# Patient Record
Sex: Female | Born: 1985
Health system: Southern US, Community
[De-identification: ages and names within clinical notes are randomized; demographics above are authoritative.]

## PROBLEM LIST (undated history)

## (undated) DIAGNOSIS — Z949 Transplanted organ and tissue status, unspecified: Secondary | ICD-10-CM

## (undated) DIAGNOSIS — N898 Other specified noninflammatory disorders of vagina: Secondary | ICD-10-CM

## (undated) DIAGNOSIS — N189 Chronic kidney disease, unspecified: Secondary | ICD-10-CM

## (undated) DIAGNOSIS — R569 Unspecified convulsions: Secondary | ICD-10-CM

## (undated) DIAGNOSIS — N926 Irregular menstruation, unspecified: Secondary | ICD-10-CM

## (undated) DIAGNOSIS — F329 Major depressive disorder, single episode, unspecified: Secondary | ICD-10-CM

## (undated) DIAGNOSIS — Z9889 Other specified postprocedural states: Secondary | ICD-10-CM

## (undated) DIAGNOSIS — G629 Polyneuropathy, unspecified: Secondary | ICD-10-CM

## (undated) DIAGNOSIS — H3320 Serous retinal detachment, unspecified eye: Secondary | ICD-10-CM

## (undated) DIAGNOSIS — F32A Depression, unspecified: Secondary | ICD-10-CM

## (undated) DIAGNOSIS — D649 Anemia, unspecified: Secondary | ICD-10-CM

## (undated) DIAGNOSIS — T8859XA Other complications of anesthesia, initial encounter: Secondary | ICD-10-CM

## (undated) DIAGNOSIS — E049 Nontoxic goiter, unspecified: Secondary | ICD-10-CM

## (undated) DIAGNOSIS — N390 Urinary tract infection, site not specified: Secondary | ICD-10-CM

## (undated) DIAGNOSIS — F419 Anxiety disorder, unspecified: Secondary | ICD-10-CM

## (undated) DIAGNOSIS — L02415 Cutaneous abscess of right lower limb: Secondary | ICD-10-CM

## (undated) DIAGNOSIS — B259 Cytomegaloviral disease, unspecified: Secondary | ICD-10-CM

## (undated) DIAGNOSIS — R112 Nausea with vomiting, unspecified: Secondary | ICD-10-CM

## (undated) DIAGNOSIS — R87620 Atypical squamous cells of undetermined significance on cytologic smear of vagina (ASC-US): Principal | ICD-10-CM

## (undated) DIAGNOSIS — R87629 Unspecified abnormal cytological findings in specimens from vagina: Secondary | ICD-10-CM

## (undated) DIAGNOSIS — E039 Hypothyroidism, unspecified: Secondary | ICD-10-CM

## (undated) DIAGNOSIS — B379 Candidiasis, unspecified: Secondary | ICD-10-CM

## (undated) DIAGNOSIS — I10 Essential (primary) hypertension: Secondary | ICD-10-CM

## (undated) DIAGNOSIS — R87811 Vaginal high risk human papillomavirus (HPV) DNA test positive: Principal | ICD-10-CM

## (undated) DIAGNOSIS — E119 Type 2 diabetes mellitus without complications: Secondary | ICD-10-CM

## (undated) DIAGNOSIS — B009 Herpesviral infection, unspecified: Secondary | ICD-10-CM

## (undated) HISTORY — DX: Irregular menstruation, unspecified: N92.6

## (undated) HISTORY — DX: Transplanted organ and tissue status, unspecified: Z94.9

## (undated) HISTORY — PX: PILONIDAL CYST EXCISION: SHX744

## (undated) HISTORY — DX: Vaginal high risk human papillomavirus (HPV) DNA test positive: R87.811

## (undated) HISTORY — PX: CHOLECYSTECTOMY: SHX55

## (undated) HISTORY — DX: Anxiety disorder, unspecified: F41.9

## (undated) HISTORY — DX: Other specified noninflammatory disorders of vagina: N89.8

## (undated) HISTORY — DX: Hypothyroidism, unspecified: E03.9

## (undated) HISTORY — DX: Essential (primary) hypertension: I10

## (undated) HISTORY — DX: Atypical squamous cells of undetermined significance on cytologic smear of vagina (ASC-US): R87.620

## (undated) HISTORY — DX: Unspecified abnormal cytological findings in specimens from vagina: R87.629

## (undated) HISTORY — PX: WISDOM TOOTH EXTRACTION: SHX21

## (undated) HISTORY — DX: Herpesviral infection, unspecified: B00.9

## (undated) HISTORY — DX: Cytomegaloviral disease, unspecified: B25.9

---
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
## 2004-01-03 ENCOUNTER — Inpatient Hospital Stay (HOSPITAL_COMMUNITY): Admission: AD | Admit: 2004-01-03 | Discharge: 2004-01-04 | Payer: Self-pay | Admitting: Obstetrics & Gynecology

## 2004-01-06 ENCOUNTER — Encounter (HOSPITAL_COMMUNITY): Admission: RE | Admit: 2004-01-06 | Discharge: 2004-02-05 | Payer: Self-pay | Admitting: Obstetrics & Gynecology

## 2004-11-21 ENCOUNTER — Ambulatory Visit (HOSPITAL_COMMUNITY): Admission: RE | Admit: 2004-11-21 | Discharge: 2004-11-21 | Payer: Self-pay | Admitting: Family Medicine

## 2006-11-03 ENCOUNTER — Ambulatory Visit (HOSPITAL_COMMUNITY): Admission: RE | Admit: 2006-11-03 | Discharge: 2006-11-03 | Payer: Self-pay | Admitting: Obstetrics and Gynecology

## 2006-11-03 ENCOUNTER — Other Ambulatory Visit: Admission: RE | Admit: 2006-11-03 | Discharge: 2006-11-03 | Payer: Self-pay | Admitting: Obstetrics and Gynecology

## 2008-01-15 ENCOUNTER — Other Ambulatory Visit: Admission: RE | Admit: 2008-01-15 | Discharge: 2008-01-15 | Payer: Self-pay | Admitting: Obstetrics and Gynecology

## 2008-10-03 ENCOUNTER — Emergency Department (HOSPITAL_COMMUNITY): Admission: EM | Admit: 2008-10-03 | Discharge: 2008-10-03 | Payer: Self-pay | Admitting: Emergency Medicine

## 2008-10-04 ENCOUNTER — Inpatient Hospital Stay (HOSPITAL_COMMUNITY): Admission: AD | Admit: 2008-10-04 | Discharge: 2008-10-06 | Payer: Self-pay | Admitting: Obstetrics and Gynecology

## 2008-10-05 ENCOUNTER — Encounter (INDEPENDENT_AMBULATORY_CARE_PROVIDER_SITE_OTHER): Payer: Self-pay | Admitting: General Surgery

## 2008-11-22 ENCOUNTER — Inpatient Hospital Stay (HOSPITAL_COMMUNITY): Admission: AD | Admit: 2008-11-22 | Discharge: 2008-11-25 | Payer: Self-pay | Admitting: Pulmonary Disease

## 2008-12-06 ENCOUNTER — Ambulatory Visit (HOSPITAL_COMMUNITY): Admission: RE | Admit: 2008-12-06 | Discharge: 2008-12-06 | Payer: Self-pay | Admitting: Internal Medicine

## 2009-01-25 ENCOUNTER — Ambulatory Visit (HOSPITAL_COMMUNITY): Admission: RE | Admit: 2009-01-25 | Discharge: 2009-01-25 | Payer: Self-pay | Admitting: Internal Medicine

## 2009-07-07 ENCOUNTER — Ambulatory Visit (HOSPITAL_COMMUNITY): Admission: RE | Admit: 2009-07-07 | Discharge: 2009-07-07 | Payer: Self-pay | Admitting: Internal Medicine

## 2009-08-10 ENCOUNTER — Ambulatory Visit: Payer: Self-pay | Admitting: Psychology

## 2010-05-17 LAB — BASIC METABOLIC PANEL
Calcium: 8 mg/dL — ABNORMAL LOW (ref 8.4–10.5)
Calcium: 8.3 mg/dL — ABNORMAL LOW (ref 8.4–10.5)
Creatinine, Ser: 1.38 mg/dL — ABNORMAL HIGH (ref 0.4–1.2)
GFR calc Af Amer: 57 mL/min — ABNORMAL LOW (ref >60)
GFR calc Af Amer: >60 mL/min (ref >60)
GFR calc non Af Amer: 47 mL/min — ABNORMAL LOW (ref >60)
GFR calc non Af Amer: 57 mL/min — ABNORMAL LOW (ref >60)
Glucose, Bld: 181 mg/dL — ABNORMAL HIGH (ref 70–99)
Sodium: 133 mEq/L — ABNORMAL LOW (ref 135–145)
Sodium: 135 mEq/L (ref 135–145)

## 2010-05-17 LAB — GLUCOSE, CAPILLARY
Glucose-Capillary: 117 mg/dL — ABNORMAL HIGH (ref 70–99)
Glucose-Capillary: 178 mg/dL — ABNORMAL HIGH (ref 70–99)
Glucose-Capillary: 183 mg/dL — ABNORMAL HIGH (ref 70–99)
Glucose-Capillary: 198 mg/dL — ABNORMAL HIGH (ref 70–99)
Glucose-Capillary: 217 mg/dL — ABNORMAL HIGH (ref 70–99)
Glucose-Capillary: 222 mg/dL — ABNORMAL HIGH (ref 70–99)
Glucose-Capillary: 235 mg/dL — ABNORMAL HIGH (ref 70–99)
Glucose-Capillary: 235 mg/dL — ABNORMAL HIGH (ref 70–99)
Glucose-Capillary: 248 mg/dL — ABNORMAL HIGH (ref 70–99)
Glucose-Capillary: 256 mg/dL — ABNORMAL HIGH (ref 70–99)
Glucose-Capillary: 301 mg/dL — ABNORMAL HIGH (ref 70–99)

## 2010-05-17 LAB — URINE CULTURE

## 2010-05-17 LAB — COMPREHENSIVE METABOLIC PANEL
Albumin: 3.4 g/dL — ABNORMAL LOW (ref 3.5–5.2)
Alkaline Phosphatase: 102 U/L (ref 39–117)
BUN: 16 mg/dL (ref 6–23)
CO2: 23 mEq/L (ref 19–32)
Calcium: 9.1 mg/dL (ref 8.4–10.5)
Creatinine, Ser: 1.09 mg/dL (ref 0.4–1.2)
GFR calc Af Amer: >60 mL/min (ref >60)
Glucose, Bld: 451 mg/dL — ABNORMAL HIGH (ref 70–99)
Total Bilirubin: 0.8 mg/dL (ref 0.3–1.2)

## 2010-05-17 LAB — CULTURE, BLOOD (ROUTINE X 2)
Culture: NO GROWTH
Report Status: 10172010

## 2010-05-17 LAB — DIFFERENTIAL
Basophils Absolute: 0 10*3/uL (ref 0.0–0.1)
Basophils Absolute: 0 10*3/uL (ref 0.0–0.1)
Basophils Absolute: 0 10*3/uL (ref 0.0–0.1)
Eosinophils Relative: 0 % (ref 0–5)
Lymphocytes Relative: 12 % (ref 12–46)
Lymphocytes Relative: 9 % — ABNORMAL LOW (ref 12–46)
Lymphs Abs: 1 10*3/uL (ref 0.7–4.0)
Monocytes Absolute: 0.7 10*3/uL (ref 0.1–1.0)
Monocytes Absolute: 0.9 10*3/uL (ref 0.1–1.0)
Monocytes Absolute: 0.9 10*3/uL (ref 0.1–1.0)
Monocytes Relative: 10 % (ref 3–12)
Neutro Abs: 5.7 10*3/uL (ref 1.7–7.7)
Neutro Abs: 6.7 10*3/uL (ref 1.7–7.7)
Neutrophils Relative %: 78 % — ABNORMAL HIGH (ref 43–77)
Neutrophils Relative %: 80 % — ABNORMAL HIGH (ref 43–77)
Neutrophils Relative %: 88 % — ABNORMAL HIGH (ref 43–77)

## 2010-05-17 LAB — CBC
Hemoglobin: 11.9 g/dL — ABNORMAL LOW (ref 12.0–15.0)
Hemoglobin: 8.9 g/dL — ABNORMAL LOW (ref 12.0–15.0)
Hemoglobin: 9 g/dL — ABNORMAL LOW (ref 12.0–15.0)
MCV: 86.9 fL (ref 78.0–100.0)
RBC: 2.95 MIL/uL — ABNORMAL LOW (ref 3.87–5.11)
RBC: 3.97 MIL/uL (ref 3.87–5.11)
RDW: 13.1 % (ref 11.5–15.5)
WBC: 7 10*3/uL (ref 4.0–10.5)

## 2010-05-17 LAB — GENTAMICIN LEVEL, RANDOM: Gentamicin Rm: 1.6 ug/mL

## 2010-05-17 LAB — KETONES, QUALITATIVE

## 2010-05-19 LAB — URINALYSIS, ROUTINE W REFLEX MICROSCOPIC
Glucose, UA: >1000 mg/dL — AB
Ketones, ur: >80 mg/dL — AB
Leukocytes, UA: NEGATIVE
Protein, ur: 100 mg/dL — AB
pH: 6 (ref 5.0–8.0)

## 2010-05-19 LAB — DIFFERENTIAL
Basophils Absolute: 0 10*3/uL (ref 0.0–0.1)
Basophils Absolute: 0 10*3/uL (ref 0.0–0.1)
Basophils Relative: 0 % (ref 0–1)
Eosinophils Absolute: 0 10*3/uL (ref 0.0–0.7)
Eosinophils Relative: 0 % (ref 0–5)
Lymphocytes Relative: 13 % (ref 12–46)
Lymphocytes Relative: 18 % (ref 12–46)
Lymphocytes Relative: 4 % — ABNORMAL LOW (ref 12–46)
Lymphs Abs: 0.6 10*3/uL — ABNORMAL LOW (ref 0.7–4.0)
Lymphs Abs: 0.9 10*3/uL (ref 0.7–4.0)
Monocytes Absolute: 0.4 10*3/uL (ref 0.1–1.0)
Monocytes Relative: 6 % (ref 3–12)
Neutro Abs: 3.7 10*3/uL (ref 1.7–7.7)
Neutro Abs: 9.5 10*3/uL — ABNORMAL HIGH (ref 1.7–7.7)
Neutrophils Relative %: 71 % (ref 43–77)
Neutrophils Relative %: 92 % — ABNORMAL HIGH (ref 43–77)

## 2010-05-19 LAB — MAGNESIUM: Magnesium: 2.1 mg/dL (ref 1.5–2.5)

## 2010-05-19 LAB — COMPREHENSIVE METABOLIC PANEL
ALT: 22 U/L (ref 0–35)
AST: 33 U/L (ref 0–37)
Albumin: 2.3 g/dL — ABNORMAL LOW (ref 3.5–5.2)
Albumin: 2.8 g/dL — ABNORMAL LOW (ref 3.5–5.2)
Alkaline Phosphatase: 74 U/L (ref 39–117)
BUN: 10 mg/dL (ref 6–23)
CO2: 20 mEq/L (ref 19–32)
Calcium: 8.3 mg/dL — ABNORMAL LOW (ref 8.4–10.5)
Chloride: 96 mEq/L (ref 96–112)
Creatinine, Ser: 1.14 mg/dL (ref 0.4–1.2)
Creatinine, Ser: 1.19 mg/dL (ref 0.4–1.2)
GFR calc Af Amer: >60 mL/min (ref >60)
GFR calc non Af Amer: 56 mL/min — ABNORMAL LOW (ref >60)
Glucose, Bld: 342 mg/dL — ABNORMAL HIGH (ref 70–99)
Potassium: 3.1 mEq/L — ABNORMAL LOW (ref 3.5–5.1)
Sodium: 135 mEq/L (ref 135–145)
Total Bilirubin: 1 mg/dL (ref 0.3–1.2)
Total Protein: 6 g/dL (ref 6.0–8.3)

## 2010-05-19 LAB — GLUCOSE, CAPILLARY
Glucose-Capillary: 182 mg/dL — ABNORMAL HIGH (ref 70–99)
Glucose-Capillary: 199 mg/dL — ABNORMAL HIGH (ref 70–99)
Glucose-Capillary: 215 mg/dL — ABNORMAL HIGH (ref 70–99)
Glucose-Capillary: 240 mg/dL — ABNORMAL HIGH (ref 70–99)
Glucose-Capillary: 241 mg/dL — ABNORMAL HIGH (ref 70–99)
Glucose-Capillary: 243 mg/dL — ABNORMAL HIGH (ref 70–99)
Glucose-Capillary: 265 mg/dL — ABNORMAL HIGH (ref 70–99)
Glucose-Capillary: 271 mg/dL — ABNORMAL HIGH (ref 70–99)
Glucose-Capillary: 297 mg/dL — ABNORMAL HIGH (ref 70–99)
Glucose-Capillary: 316 mg/dL — ABNORMAL HIGH (ref 70–99)
Glucose-Capillary: 325 mg/dL — ABNORMAL HIGH (ref 70–99)

## 2010-05-19 LAB — PREGNANCY, URINE: Preg Test, Ur: NEGATIVE

## 2010-05-19 LAB — CBC
HCT: 35.5 % — ABNORMAL LOW (ref 36.0–46.0)
Hemoglobin: 12.3 g/dL (ref 12.0–15.0)
MCHC: 35.2 g/dL (ref 30.0–36.0)
MCV: 87.5 fL (ref 78.0–100.0)
MCV: 88.2 fL (ref 78.0–100.0)
Platelets: 140 10*3/uL — ABNORMAL LOW (ref 150–400)
Platelets: 94 10*3/uL — ABNORMAL LOW (ref 150–400)
Platelets: 96 10*3/uL — ABNORMAL LOW (ref 150–400)
RBC: 3.77 MIL/uL — ABNORMAL LOW (ref 3.87–5.11)
RDW: 12.4 % (ref 11.5–15.5)
RDW: 13 % (ref 11.5–15.5)
WBC: 10.3 10*3/uL (ref 4.0–10.5)
WBC: 5.2 10*3/uL (ref 4.0–10.5)

## 2010-05-19 LAB — URINE MICROSCOPIC-ADD ON

## 2010-05-19 LAB — TYPE AND SCREEN
ABO/RH(D): O POS
Antibody Screen: NEGATIVE

## 2010-05-19 LAB — AMYLASE: Amylase: 22 U/L — ABNORMAL LOW (ref 27–131)

## 2010-05-19 LAB — PHOSPHORUS: Phosphorus: 2.4 mg/dL (ref 2.3–4.6)

## 2010-05-19 LAB — HEMOGLOBIN A1C: Hgb A1c MFr Bld: 12.6 % — ABNORMAL HIGH (ref 4.6–6.1)

## 2010-05-19 LAB — BASIC METABOLIC PANEL
BUN: 11 mg/dL (ref 6–23)
Calcium: 8.3 mg/dL — ABNORMAL LOW (ref 8.4–10.5)
GFR calc non Af Amer: >60 mL/min (ref >60)
Glucose, Bld: 247 mg/dL — ABNORMAL HIGH (ref 70–99)

## 2010-06-26 NOTE — Op Note (Signed)
[name], [name]            ACCOUNT NO.:  [id number]   MEDICAL RECORD NO.:  [id number]          PATIENT TYPE:  INP   LOCATION:  A207                          FACILITY:  [facility]   PHYSICIAN:  Chelsea Primus, MD      DATE OF BIRTH:  [birth year]   DATE OF PROCEDURE:  10/05/2008  DATE OF DISCHARGE:  10/06/2008                               OPERATIVE REPORT   PREOPERATIVE DIAGNOSIS:  Acalculous cholecystitis.   POSTOPERATIVE DIAGNOSIS:  Acalculous cholecystitis.   PROCEDURE:  Laparoscopic cholecystectomy.   SURGEON:  Chelsea Primus, MD   ANESTHESIA:  General endotracheal local anesthetic 0.5% Sensorcaine  plain.   SPECIMEN:  Gallbladder mass.   ESTIMATED BLOOD LOSS:  Minimal.   INDICATIONS:  The patient is a 25 year old female with poorly-controlled  diabetes mellitus who presented to [facility] with 5 days of  right upper quadrant abdominal pain.  She was admitted for continued  management and workup.  She was diagnosed with acute acalculous  cholecystitis.  Risks, benefits, and alternatives of a laparoscopic  possible open cholecystectomy were discussed with the patient including  but not limited the risk of bleeding, infection, bile leak, small bowel  injury, as well as possibility of intraoperative cardiac and pulmonary  events.  The patient's questions and concerns were addressed.  The  patient was consented for planned procedure.   OPERATION:  The patient was taken to the operating room, was placed in  supine position on the operating room table, at which time the general  anesthetic was administered.  Once the patient was asleep, she was  endotracheally intubated by Anesthesia.  At this time, her abdomen was  prepped with DuraPrep solution and draped in standard fashion.  An  infraumbilical stab incision was created with an #11 blade scalpel.  Additional dissection down to the subcuticular tissue was carried out  using a Kocher clamp, which was utilized to grasp  the anterior abdominal  fascia anteriorly.  A Veress needle was then inserted.  Saline drop test  was utilized to confirm intraperitoneal placement and then  pneumoperitoneum was then initiated.  Once sufficient pneumoperitoneum  was obtained, a 11-mm trocar was inserted over the laparoscope allowing  visualization of the trocar entering into the peritoneal cavity.  At  this time, the inner cannula was removed.  The laparoscope was  reinserted.  There was no evidence of any trocar or Veress needle  placement injury.  At this time, the patient was placed in a reverse  Trendelenburg left lateral decubitus position.  The remaining trocars  were placed.  An 11-mm trocar was placed in the epigastrium and a 5-mm  trocar was placed in the midline between the two 11-mm trocars and a 5-  mm trocar was placed into the right lateral abdominal wall.  The fundus  of the gallbladder was grasped and lifted up and over the right lobe of  the liver.  Blunt dissection was carried out to free the omental  adhesions involving the body of gallbladder.  The infundibulum was  identified, the peritoneum was bluntly stripped using [place]  dissectors exposing the cystic duct  as it entered into the infundibulum.  Three Endoclips were placed proximally and one distally and the cystic  duct was divided between 2 most distal clips.  Similarly, the cystic was  identified.  A window was created behind the cystic artery.  Three  Endoclips were placed proximally, one distally, and the cystic artery  was divided between the 2 most distal clips.  At this time, the  gallbladder was dissected free from the gallbladder fossa using  electrocautery.  Once this was freed, was placed into an EndoCatch bag  and placed up and over the right lobe of the liver.  Irrigation was  utilized to irrigate the surgical field.  The returning aspirate was  clear.  Inspection of the Endoclips demonstrates no evidence of any bile  leak or  bleeding and at this time, the attention was turned to closure.   Using an Endoclose suture passing device, a 2-0 Vicryl suture was passed  through both 11-mm trocar sites.  With the sutures in place, a piece of  Surgicel was placed into the gallbladder fossa.  The gallbladder was  grasped and removed through the umbilical trocar site.  It is removed  intact EndoCatch bag.  Once free, it was placed in the back table and  sent as a permanent specimen to Pathology.  At this time, the  pneumoperitoneum was evacuated.  The 2-0 Vicryl sutures were secured.  The local anesthetic was instilled.  A 4-0 Monocryl was utilized to  reapproximate the skin edges in all 4 trocar sites.  Skin was washed,  dried with moist and dry towel.  Benzoin was applied around the  incision.  Half-inch Steri-Strips were placed.  The drapes were removed.  The patient was allowed to come out of general anesthetic and was  transferred back to regular hospital bed.  At the conclusion of  procedure, all instrument, sponge, and needle counts were correct.  The  patient tolerated the procedure extremely well.      Chelsea Primus, MD  Electronically Signed     BZ/MEDQ  D:  10/08/2008  T:  10/09/2008  Job:  [id number]

## 2010-06-26 NOTE — Consult Note (Signed)
NAMESHAMSA, Tracy [name]            ACCOUNT NO.:  [id number]   MEDICAL RECORD NO.:  [id number]          PATIENT TYPE:  INP   LOCATION:  A207                          FACILITY:  [facility]   PHYSICIAN:  Chelsea Primus, MD      DATE OF BIRTH:  [birth year]   DATE OF CONSULTATION:  10/05/2008  DATE OF DISCHARGE:  10/06/2008                                 CONSULTATION   REASON FOR CONSULTATION:  Abdominal pain.   HISTORY OF PRESENT ILLNESS:  The patient is a 25 year old poorly-  controlled diabetic female who presented with approximately 5 days of  increasing nausea and vomiting and abdominal pain.  She has pain in her  right upper quadrant with radiation to the back.  Pain has increased  with movement.  She denies any fevers or chills.  No change in bowel  movements.  No hematochezia.  No melena.  She has no history of  hematemesis.  She has not had any similar symptomatology the past.  She  has no known family history of biliary disease.  No episodes of  jaundice.   PAST MEDICAL HISTORY:  Insulin-dependent diabetes mellitus, poorly  controlled with an insulin pump.   PAST SURGICAL HISTORY:  Pilonidal cyst and insertion of an insulin pump.   MEDICATIONS:  Insulin via the pump.   ALLERGIES:  CIPRO, PENICILLIN causes rashes.   SOCIAL HISTORY:  No tobacco.  No alcohol.  No recreational drug use.   PHYSICAL EXAMINATION:  VITAL SIGNS:  Temperature 99.3, heart rate 92,  respiratory rate 18, blood pressure 123/74.  She is 99% O2 saturation on  room air.  GENERAL:  The patient is not in any acute distress.  She is alert and  oriented x3.  She is sitting in an upright position in a hospital bed.  She is not in any acute distress.  HEENT:  Scalp no deformities or masses.  Eyes, pupils are equal, round,  reactive.  Extraocular movements are intact.  No conjunctival pallor or  scleral icterus noted.  Oral mucosa is pink.  Normal occlusion.  NECK:  Trachea is midline.  No cervical lymphadenopathy.  PULMONARY:  Unlabored respirations.  She is clear to auscultation.  CARDIOVASCULAR:  Regular rate and rhythm.  She has 2+ radial and femoral  pulses bilaterally.  ABDOMEN:  She has diminished bowel sounds.  Abdomen is soft, mild to  moderately obese.  She does have positive right upper quadrant signs  with positive Murphy sign.  She has no diffuse peritoneal signs.  No  masses or hernias are apparent.  EXTREMITIES:  Warm and dry.   PERTINENT LABORATORY AND RADIOGRAPHIC STUDIES:  CBC:  White blood cell  count 5.0, hemoglobin 11.6, hematocrit 32.9, platelets 96.  Basic  metabolic panel:  Sodium A999333, potassium 3.7, chloride 100, bicarb 22,  BUN 7, creatinine 1.14, blood glucose elevated at 300, alk phosphatase  is 86.  ALT and AST are within normal limits.  Previous right upper  quadrant ultrasound does demonstrate positive gallbladder wall  thickening with suspicion of pericholecystic fluid.  There is no  evidence of any biliary stones.  ASSESSMENT AND PLAN:  Acute acalculous cholecystitis.  At this time, the  patient has antibiotics currently running.  Her symptomatology is  consistent with a gallbladder etiology.  Risks, benefits, and  alternatives of the laparoscopic possible open cholecystectomy are  discussed with the patient, the patient's family who is present at the  time of the evaluation.  Risks including but not limited to the risk of  bleeding, infection, bile leak, small bowel injury, bile duct injury as  well as the possibility of intraoperative cardiac and pulmonary events  are discussed with the patient.  At this time, she will be consented for  planned laparoscopic possible open cholecystectomy.  At this time, we  will continue to accept aggressive blood glucose control and this is  still poorly controlled.  She will be continued on DVT prophylaxis and  in fact continue to remain n.p.o. status.  IV fluid hydration will be  continued.      Chelsea Primus, MD   Electronically Signed     BZ/MEDQ  D:  10/08/2008  T:  10/09/2008  Job:  [id number]   cc:   Jonnie Kind, M.D.  Fax: [phone]   Paula Compton. Willey Blade, MD  Fax: [phone]

## 2010-06-29 NOTE — Discharge Summary (Signed)
   Tracy [name], Tracy [name]                       ACCOUNT NO.:  [id number]   MEDICAL RECORD NO.:  [id number]                   PATIENT TYPE:  INP   LOCATION:  A207                                 FACILITY:  [facility]   PHYSICIAN:  Halford Chessman, M.D.               DATE OF BIRTH:  02-17-1985   DATE OF ADMISSION:  11/10/2002  DATE OF DISCHARGE:  11/14/2002                                 DISCHARGE SUMMARY   DISCHARGE DIAGNOSIS:  Resolved diabetic ketoacidosis.   HISTORY OF PRESENTING ILLNESS AND PAST MEDICAL HISTORY:  Please admission  H&P.   HOSPITAL COURSE:  [age]-year-old who was admitted to the ICU for DKA,  aggressive hydration and insulin management was undertaken.  Please see lab  flow sheets for details as her pH slowly began to improve and rise.  Twenty  four hours after admission pH had increased back to normal to 7.38, pCO2 to  24, and bicarb level had risen to 13.8.  During that time glucose  appropriately came down quite slowly.  She did start to have hypokalemia 24  hours after admission with the potassium slowly going from 4.3 on admission  and to 2.9 the following morning.  On the day of admission at 1753 hours the  potassium was 4.8 and at 2300 hours it was 3.8.  Potassium management was  begun and was able to get the potassium back up to 3.2.  She did quite well  during the hospital stay.  Obviously became much more alert soon after  admission and the blood sugars began to normalize.  Hemoglobin A1C was 14.5  so clearly noncompliant and out of control.   I have had a good discussion with [name] and she understands that she has  got to take her illness seriously.  We are going to keep her on Lantus now  and transition her to an insulin pump.   DISCHARGE MEDICATIONS:  Lantus 30 units q.p.m. as well as sliding scale  Regular Insulin with meals.   DISCHARGE FOLLOWUP:  One week.  We will recheck chem and we will set up  followup with her primary endocrinologist for  insulin pump placement.   DISCHARGE CONDITION:  Improved and stable.   DISCHARGE PHYSICAL:  Please see note on day of discharge for physical.     ___________________________________________                                         Halford Chessman, M.D.   JCG/MEDQ  D:  11/23/2002  T:  11/23/2002  Job:  [id number]

## 2010-06-29 NOTE — Discharge Summary (Signed)
[name], Tracy [name]             ACCOUNT NO.:  [id number]   MEDICAL RECORD NO.:  [id number]          PATIENT TYPE:  INP   LOCATION:  A411                          FACILITY:  [facility]   PHYSICIAN:  Florian Buff, M.D.   DATE OF BIRTH:  11-May-1985   DATE OF ADMISSION:  01/03/2004  DATE OF DISCHARGE:  11/23/2005LH                                 DISCHARGE SUMMARY   DISCHARGE DIAGNOSES:  1.  Status post incision and drainage and packing of a large right vulvar      abscess.  2.  Insulin-dependent diabetes.  3.  Unremarkable postoperative course.   PROCEDURE:  Admission to the hospital with an incision and drainage of right  vulvar abscess.   HISTORY:  Please refer to the transcribed history and physical for details  of admission to the hospital.   HOSPITAL COURSE:  The patient is known to be an insulin-dependent diabetic.  She has had multiple peri-rectal abscesses in the past.  Evidently, she came  in complaining of a bump on her bottom that started four days prior to  admission.  At the time that I saw her, she had a large vulvar abscess, it  was quite tender, erythematous, and was creeping up into her groin in that  tissue plane.  As a result, she was admitted to the hospital, given IV  Levaquin preoperatively, and for the next day scheduled for incision,  drainage, and packing.  A large amount of purulent drainage was removed and  packed with Iodoform gauze.  Preparations were then made postoperatively for  the patient to undergo routine local care physical therapy.  She was  discharged to home on the afternoon of surgery with the packing in place.  Follow up with physical therapy in two days, in approximately 36 hours after  discharge for local wound care.  I reviewed that with the patient, her  family, and the physical therapy.  The patient is also discharged to home on  hydrocodone liquid for pain.  She cannot take tablets, but she is going to  take her Levaquin antibiotic at  home.     Luth   LHE/MEDQ  D:  01/17/2004  T:  01/17/2004  Job:  [id number]

## 2010-06-29 NOTE — H&P (Signed)
Tracy [name], Tracy [name]                       ACCOUNT NO.:  [id number]   MEDICAL RECORD NO.:  [id number]                   PATIENT TYPE:  INP   LOCATION:  A207                                 FACILITY:  [facility]   PHYSICIAN:  Halford Chessman, M.D.               DATE OF BIRTH:  [birth year]   DATE OF ADMISSION:  11/10/2002  DATE OF DISCHARGE:  11/14/2002                                HISTORY & PHYSICAL   PRIMARY CARE PHYSICIAN:  Halford Chessman, M.D.   ADMISSION DIAGNOSIS:  Diabetic ketoacidosis.   ADMITTING CONDITION:  Critical.   HISTORY OF PRESENT ILLNESS:  An 25 year old well-known type 1 diabetic, who  has been noncompliant with her insulin, who presented with nausea and in 4DK  in the office, blood sugars in the 500's.  She was obviously ketotic even by  smell.  Discussed the situation with the mother and was sent to the  emergency department immediately for further evaluation.  In the emergency  room, as expected, she was found to be in obvious DKA.  Insulin drip was  begun.  Initial blood sugar in the emergency department was 596.  CO2 was 4.  Potassium initially was 4.3, albeit falsely elevated.  Initial ABG showed a  pH of 6.81, pCO2 of 7, pO2 of 177, bicarbonate of 1.1.  The patient was  aggressively managed in the emergency department and admitted to the ICU for  further workup.   PAST MEDICAL HISTORY:  Type 1 diabetes, noncompliant.   PAST SURGICAL HISTORY:  None.   MEDICATIONS:  Lantus 20 units in the evening with sliding scale, Regular.   FAMILY HISTORY:  Noncontributory.  No other type 1 diabetics.   SOCIAL HISTORY:  Lives at home with her parents.  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   PHYSICAL EXAMINATION:  GENERAL:  When I saw the patient in the office, she  was afebrile.  In the office, she is obviously ketotic, quite weak,  nauseous.  VITAL SIGNS:  Blood pressure was stable at 110/68, respiratory rate was  elevated at 28, pulse 82.  O2 saturation 99% on room air.  HEENT:  Dry mucous membranes in nasal and oropharynx.  NECK:  Supple, no lymphadenopathy.  CHEST:  Clear to auscultation bilaterally.  CARDIOVASCULAR:  Regular rhythm, normal S1, S2.  No murmurs.  ABDOMEN:  Bowel sounds positive, soft, nontender.  EXTREMITIES:  No cyanosis, clubbing or edema.   LABORATORY DATA:  Please see flow sheet from the emergency department and  the History of Present Illness above.   ASSESSMENT:  An 25 year old with type 1 diabetes, admitted with diabetic  ketoacidosis.   PLAN:  1. Insulin drip.  2. Aggressive fluid hydration.  3. Once blood sugars slowly drop below 250, will discontinue the insulin     drip and add on glucose to the IV fluids.  Aggressive potassium     management will be needed.  4.  Will continue to follow blood gases as well as Chem-7 very frequently.     Bicarbonate drip will hopefully not be needed as her pH will start to     rise.  5. Dr. Caron Presume will also see the patient this evening in the ICU.       ___________________________________________                                         Halford Chessman, M.D.   JCG/MEDQ  D:  11/23/2002  T:  11/23/2002  Job:  [id number]

## 2010-06-29 NOTE — Discharge Summary (Signed)
NAMEDARREL, HOLTMEYER            ACCOUNT NO.:  [id number]   MEDICAL RECORD NO.:  [id number]          PATIENT TYPE:  INP   LOCATION:  A207                          FACILITY:  [facility]   PHYSICIAN:  Chelsea Primus, MD      DATE OF BIRTH:  05-19-1985   DATE OF ADMISSION:  10/04/2008  DATE OF DISCHARGE:  08/26/2010LH                               DISCHARGE SUMMARY   ADMISSION DIAGNOSIS:  Abdominal pain.   DISCHARGE DIAGNOSES:  1. Status post laparoscopic cholecystectomy for acute acalculous      cholecystitis.  2. Poorly-controlled diabetes mellitus with an insulin pump.   PROCEDURE:  Laparoscopic cholecystectomy.   ADMITTING PHYSICIAN:  Jonnie Kind, MD.   DISCHARGING PHYSICIAN:  Chelsea Primus, MD   DISPOSITION:  Home.   BRIEF HISTORY AND PHYSICAL:  Please see the admission history and  physical for the complete H and P.  The patient is a 25 year old female  with a history of longstanding poorly-controlled insulin-dependent  diabetes mellitus on an insulin pump.  She presented with approximately  5 days of increasing right upper quadrant abdominal pain with radiation  to the back.  She was admitted for continued management intervention.   HOSPITAL COURSE:  The patient was admitted on October 04, 2008.  The  workup was consistent for suspected acalculous cholecystitis.  He was  taken to the operating room on October 05, 2008.  She tolerated the  procedure well.  She has had a brief period in the postanesthetic care  unit and was transferred back to the surgical floor.  She was continued  to be monitored, and attempts to continue on strict blood glucose was  undertaken.  On October 06, 2008, the patient's pain was controlled.  She  is tolerating a regular diabetic diet and she was ambulatory and pain  was controlled with oral analgesics and plans were initiated for  discharge with plans for follow up with Endocrinology as an outpatient  for continued blood sugar management.   DISCHARGE INSTRUCTIONS:  The patient instructed to obtain a healthy  diabetic diet.  She may shower.  She is not to soak the incisions for  the next 2-3 weeks.  She may increase her activity as tolerated.  Others, she is not to lit anything greater than 20 pounds for the next 3-  4 weeks.  She may remove the adhesive strips in 1-2 weeks.  She was  advised to monitor blood glucose levels closely to attempt strict  control between 100 and 150 but ideally keeping between 100-200.  The  patient expressed understanding.   DISCHARGE MEDICATIONS:  Lortab 5/500 one to two p.o. q.4 h. p.r.n. pain.  Additionally, the patient was instructed to maintain again her blood  glucose with her continued utilization of her insulin pump, and again  the patient has a followup appointment with Dr. Buddy Duty as an outpatient  set up by her primary physician.  Additionally, she is to follow up with  me in 3 weeks if she has questions, concerns, or problems.      Chelsea Primus, MD  Electronically Signed  BZ/MEDQ  D:  10/08/2008  T:  10/09/2008  Job:  [id number]

## 2010-06-29 NOTE — Op Note (Signed)
NAMEMICHAELLA, KREITER             ACCOUNT NO.:  [id number]   MEDICAL RECORD NO.:  [id number]          PATIENT TYPE:  INP   LOCATION:  A411                          FACILITY:  [facility]   PHYSICIAN:  Florian Buff, M.D.   DATE OF BIRTH:  October 04, 1985   DATE OF PROCEDURE:  01/04/2004  DATE OF DISCHARGE:                                 OPERATIVE REPORT   PREOPERATIVE DIAGNOSES:  1.  Right vulvar abscess.  2.  Insulin-dependent diabetes.   POSTOPERATIVE DIAGNOSES:  1.  Right vulvar abscess.  2.  Insulin-dependent diabetes.   PROCEDURE:  Incision and drainage and packing of right vulvar abscess.   SURGEON:  Florian Buff, M.D.   ANESTHESIA:  Laryngeal mask airway.   FINDINGS:  The patient it appears had a small infected hair follicle that  she became aware of this past Saturday. She presented to the office  yesterday and at that time had a large indurated erythematous painful right  vulva. The area that started appears to be the most dependent portion of the  abscess.   DESCRIPTION OF PROCEDURE:  The patient was taken to the operating room and  placed in supine position, underwent laryngeal mask airway, placed in the  dorsal lithotomy position, prepped and draped in the usual sterile fashion.  Incision was made at the most dependent portion of the abscess. A large  amount of purulent exudate came out. The patient had essentially a cavity  where the abscess fluid was. The tissue was still quite indurated and firm.  I explored it with instruments and digitally and broke up all of the  loculations and got out all of the purulent exudate. The cavity was then  packed with 1-inch iodoform gauze and dressed. Cultures were taken. The  patient was awakened from anesthesia, taken to recovery room in good and  stable condition. All counts were correct.     Luth   LHE/MEDQ  D:  01/04/2004  T:  01/04/2004  Job:  [id number]

## 2010-06-29 NOTE — H&P (Signed)
Tracy, [name]             ACCOUNT NO.:  [id number]   MEDICAL RECORD NO.:  [id number]          PATIENT TYPE:  INP   LOCATION:  A412                          FACILITY:  [facility]   PHYSICIAN:  Florian Buff, M.D.   DATE OF BIRTH:  May 01, 1985   DATE OF ADMISSION:  01/03/2004  DATE OF DISCHARGE:  [place]   Tracy [name] is a 25 year old white female, gravida 0, para 0, who is seen as a  work-in in our office by Derrek Monaco, our nurse practitioner, and  called me to see the patient when she evaluated her and found her to have a  large right vulvar abscess. The patient is an insulin-dependent diabetic. It  is rather large. It looks like it started from a small maybe infected hair  follicle or very small area, but it basically involves the entire right  vulva. It is very indurated, exquisitely tender, and patient has been  running fever. The patient states that it came up some time this weekend,  and she has been short of doctoring it at home as best she could. She has  not been on antibiotics or seen another physician for it.   PAST MEDICAL HISTORY:  She is an insulin-dependent diabetic now since 1998.   PAST SURGICAL HISTORY:  Negative.   PAST OBSTETRICAL HISTORY:  She is nulliparous.   ALLERGIES:  PENICILLIN.   REVIEW OF SYSTEMS:  As per HPI, otherwise negative.   MEDICATIONS:  Her only medication is insulin. She takes Lantus 30 units at  night, and she uses a sliding scale 1 unit per 10 carbs throughout the day.   PHYSICAL EXAMINATION:  VITAL SIGNS:  Blood pressure is 108/80, weight 129  pounds.  HEENT:  Unremarkable.  NECK:  Thyroid is normal.  LUNGS:  Clear.  HEART:  Regular rhythm without murmur, regurgitation, or gallop.  BREASTS:  Deferred.  ABDOMEN:  Benign.  PELVIC:  Deferred except for the vulvar exam.  EXTREMITIES:  Warm with no edema.  NEUROLOGICAL:  Grossly intact.   IMPRESSION:  1.  Insulin-dependent diabetic.  2.  Large right vulvar abscess.   PLAN:  The patient is admitted to the hospital to undergo preoperative IV  antibiotics with Levaquin. She will undergo incision and drainage tomorrow  morning and then probably be in the hospital thereafter for IV antibiotics.     Luth   LHE/MEDQ  D:  01/03/2004  T:  01/03/2004  Job:  [id number]

## 2010-12-03 ENCOUNTER — Other Ambulatory Visit: Payer: Self-pay | Admitting: Adult Health

## 2010-12-03 ENCOUNTER — Other Ambulatory Visit (HOSPITAL_COMMUNITY)
Admission: RE | Admit: 2010-12-03 | Discharge: 2010-12-03 | Disposition: A | Discharge status: Home / Self Care | Payer: BC Managed Care – PPO | Source: Ambulatory Visit | Attending: Obstetrics and Gynecology | Admitting: Obstetrics and Gynecology

## 2010-12-03 DIAGNOSIS — Z01419 Encounter for gynecological examination (general) (routine) without abnormal findings: Secondary | ICD-10-CM | POA: Insufficient documentation

## 2010-12-03 DIAGNOSIS — E049 Nontoxic goiter, unspecified: Secondary | ICD-10-CM

## 2010-12-03 DIAGNOSIS — Z113 Encounter for screening for infections with a predominantly sexual mode of transmission: Secondary | ICD-10-CM | POA: Insufficient documentation

## 2010-12-04 ENCOUNTER — Other Ambulatory Visit (HOSPITAL_COMMUNITY): Payer: Self-pay

## 2010-12-05 ENCOUNTER — Ambulatory Visit (HOSPITAL_COMMUNITY)
Admission: RE | Admit: 2010-12-05 | Discharge: 2010-12-05 | Disposition: A | Discharge status: Home / Self Care | Payer: BC Managed Care – PPO | Source: Ambulatory Visit | Attending: Adult Health | Admitting: Adult Health

## 2010-12-05 DIAGNOSIS — E049 Nontoxic goiter, unspecified: Secondary | ICD-10-CM | POA: Insufficient documentation

## 2011-05-10 ENCOUNTER — Encounter (INDEPENDENT_AMBULATORY_CARE_PROVIDER_SITE_OTHER): Payer: BC Managed Care – PPO | Admitting: Ophthalmology

## 2011-05-10 DIAGNOSIS — E1039 Type 1 diabetes mellitus with other diabetic ophthalmic complication: Secondary | ICD-10-CM

## 2011-05-10 DIAGNOSIS — E11359 Type 2 diabetes mellitus with proliferative diabetic retinopathy without macular edema: Secondary | ICD-10-CM

## 2011-05-10 DIAGNOSIS — H43819 Vitreous degeneration, unspecified eye: Secondary | ICD-10-CM

## 2011-05-10 DIAGNOSIS — H3581 Retinal edema: Secondary | ICD-10-CM

## 2011-05-10 DIAGNOSIS — H2510 Age-related nuclear cataract, unspecified eye: Secondary | ICD-10-CM

## 2011-05-20 ENCOUNTER — Ambulatory Visit (INDEPENDENT_AMBULATORY_CARE_PROVIDER_SITE_OTHER): Payer: BC Managed Care – PPO | Admitting: Ophthalmology

## 2011-05-20 DIAGNOSIS — H3581 Retinal edema: Secondary | ICD-10-CM

## 2011-06-10 ENCOUNTER — Ambulatory Visit (INDEPENDENT_AMBULATORY_CARE_PROVIDER_SITE_OTHER): Payer: BC Managed Care – PPO | Admitting: Ophthalmology

## 2011-06-10 DIAGNOSIS — E11359 Type 2 diabetes mellitus with proliferative diabetic retinopathy without macular edema: Secondary | ICD-10-CM

## 2011-06-14 ENCOUNTER — Encounter (INDEPENDENT_AMBULATORY_CARE_PROVIDER_SITE_OTHER): Payer: BC Managed Care – PPO | Admitting: Ophthalmology

## 2011-06-14 DIAGNOSIS — E11359 Type 2 diabetes mellitus with proliferative diabetic retinopathy without macular edema: Secondary | ICD-10-CM

## 2011-06-14 DIAGNOSIS — E1065 Type 1 diabetes mellitus with hyperglycemia: Secondary | ICD-10-CM

## 2011-06-17 ENCOUNTER — Other Ambulatory Visit (INDEPENDENT_AMBULATORY_CARE_PROVIDER_SITE_OTHER): Payer: BC Managed Care – PPO | Admitting: Ophthalmology

## 2011-06-21 ENCOUNTER — Emergency Department (HOSPITAL_COMMUNITY)
Admission: EM | Admit: 2011-06-21 | Discharge: 2011-06-21 | Disposition: A | Discharge status: Home / Self Care | Payer: BC Managed Care – PPO | Attending: Emergency Medicine | Admitting: Emergency Medicine

## 2011-06-21 ENCOUNTER — Encounter (HOSPITAL_COMMUNITY): Payer: Self-pay | Admitting: *Deleted

## 2011-06-21 ENCOUNTER — Emergency Department (HOSPITAL_COMMUNITY): Payer: BC Managed Care – PPO

## 2011-06-21 DIAGNOSIS — S93409A Sprain of unspecified ligament of unspecified ankle, initial encounter: Secondary | ICD-10-CM | POA: Insufficient documentation

## 2011-06-21 DIAGNOSIS — E119 Type 2 diabetes mellitus without complications: Secondary | ICD-10-CM | POA: Insufficient documentation

## 2011-06-21 DIAGNOSIS — M25579 Pain in unspecified ankle and joints of unspecified foot: Secondary | ICD-10-CM | POA: Insufficient documentation

## 2011-06-21 DIAGNOSIS — M25473 Effusion, unspecified ankle: Secondary | ICD-10-CM | POA: Insufficient documentation

## 2011-06-21 DIAGNOSIS — S93401A Sprain of unspecified ligament of right ankle, initial encounter: Secondary | ICD-10-CM

## 2011-06-21 DIAGNOSIS — M25476 Effusion, unspecified foot: Secondary | ICD-10-CM | POA: Insufficient documentation

## 2011-06-21 DIAGNOSIS — S9000XA Contusion of unspecified ankle, initial encounter: Secondary | ICD-10-CM | POA: Insufficient documentation

## 2011-06-21 DIAGNOSIS — Y990 Civilian activity done for income or pay: Secondary | ICD-10-CM | POA: Insufficient documentation

## 2011-06-21 MED ORDER — IBUPROFEN 600 MG PO TABS: 600.0000 mg | ORAL_TABLET | Freq: Four times a day (QID) | ORAL | Status: AC | PRN

## 2011-06-21 MED ORDER — HYDROCODONE-ACETAMINOPHEN 5-325 MG PO TABS: 1.0000 | ORAL_TABLET | ORAL | Status: AC | PRN

## 2011-06-21 NOTE — ED Provider Notes (Signed)
History     CSN: [id number]  Arrival date & time 06/21/11  1741   First MD Initiated Contact with Patient 06/21/11 1754      Chief Complaint  Patient presents with  . Ankle Pain    (Consider location/radiation/quality/duration/timing/severity/associated sxs/prior treatment) Patient is a 26 y.o. female presenting with ankle pain. The history is provided by the patient.  Ankle Pain  The incident occurred 3 to 5 hours ago. The incident occurred at work. Injury mechanism: Patient tip the go cart she was riding on at work causing her right ankle to invert. The pain is present in the right ankle. The pain is at a severity of 5/10. The pain is moderate. The pain has been constant since onset. Pertinent negatives include no numbness, no loss of sensation and no tingling. Associated symptoms comments: She can't bear weight but it is painful.. The symptoms are aggravated by bearing weight and palpation.    Past Medical History  Diagnosis Date  . Diabetes mellitus     Past Surgical History  Procedure Date  . Cholecystectomy     History reviewed. No pertinent family history.  History  Substance Use Topics  . Smoking status: Never Smoker   . Smokeless tobacco: Not on file  . Alcohol Use: No    OB History    Grav Para Term Preterm Abortions TAB SAB Ect Mult Living                  Review of Systems  Musculoskeletal: Positive for joint swelling and arthralgias.  Skin: Negative for wound.  Neurological: Negative for tingling, weakness and numbness.    Allergies  Penicillins and Sulfa antibiotics  Home Medications   Current Outpatient Rx  Name Route Sig Dispense Refill  . HYDROCODONE-ACETAMINOPHEN 5-325 MG PO TABS Oral Take 1 tablet by mouth every 4 (four) hours as needed for pain. 15 tablet 0  . IBUPROFEN 600 MG PO TABS Oral Take 1 tablet (600 mg total) by mouth every 6 (six) hours as needed for pain. 20 tablet 0    BP 132/82  Pulse 77  Temp(Src) 98.8 F (37.1 C)  (Oral)  Resp 18  Ht 5\' 3"  (1.6 m)  Wt 150 lb (68.04 kg)  BMI 26.57 kg/m2  Physical Exam  Nursing note and vitals reviewed. Constitutional: She appears well-developed and well-nourished.  HENT:  Head: Normocephalic.  Cardiovascular: Normal rate and intact distal pulses.  Exam reveals no decreased pulses.   Pulses:      Dorsalis pedis pulses are 2+ on the right side, and 2+ on the left side.       Posterior tibial pulses are 2+ on the right side, and 2+ on the left side.  Musculoskeletal: She exhibits edema and tenderness.       Right ankle: She exhibits decreased range of motion, swelling and ecchymosis. She exhibits normal pulse. tenderness. Lateral malleolus tenderness found. No head of 5th metatarsal and no proximal fibula tenderness found. Achilles tendon normal.  Neurological: She is alert. No sensory deficit.  Skin: Skin is warm, dry and intact.    ED Course  Procedures (including critical care time)  Labs Reviewed - No data to display Dg Ankle Complete Right  06/21/2011  *RADIOLOGY REPORT*  Clinical Data: Ankle pain and swelling.  RIGHT ANKLE - COMPLETE 3+ VIEW  Comparison: None.  Findings: Anatomic alignment of the right ankle.  No fracture. Soft tissues appear within normal limits.  IMPRESSION: Negative.  Original Report Authenticated By: Cay Schillings  LAMKE, M.D.     1. Right ankle sprain       MDM  ASO and crutches provided.  Cap refill normal after ASO applied.  RICE, referral to pcp if pain symptoms and swelling are not better over the next 5 days.            Evalee Jefferson, [place] 06/21/11 1911

## 2011-06-21 NOTE — ED Provider Notes (Signed)
Medical screening examination/treatment/procedure(s) were performed by non-physician practitioner and as supervising physician I was immediately available for consultation/collaboration.  [name], MD 06/21/11 2245

## 2011-06-21 NOTE — ED Notes (Signed)
Injury on go cart, today, pain rt ankle,No hI , ,alert,

## 2011-06-21 NOTE — ED Notes (Signed)
Pt states was on pedal cart and was "hoarsing around" with another person also on same type cart when she flipped the cart landing on rt ankle. Pt ignored injury at that time then went home and rode her horse. Ankle has progressively swollen and pain increased since this afternoon. Rt ankle has noted edema. Pulses present.  Ice pack given.

## 2011-06-21 NOTE — Discharge Instructions (Signed)
Ankle Sprain An ankle sprain is an injury to the strong, fibrous tissues (ligaments) that hold the bones of your ankle joint together.  CAUSES Ankle sprain usually is caused by a fall or by twisting your ankle. People who participate in sports are more prone to these types of injuries.  SYMPTOMS  Symptoms of ankle sprain include:  Pain in your ankle. The pain may be present at rest or only when you are trying to stand or walk.   Swelling.   Bruising. Bruising may develop immediately or within 1 to 2 days after your injury.   Difficulty standing or walking.  DIAGNOSIS  Your caregiver will ask you details about your injury and perform a physical exam of your ankle to determine if you have an ankle sprain. During the physical exam, your caregiver will press and squeeze specific areas of your foot and ankle. Your caregiver will try to move your ankle in certain ways. An X-ray exam may be done to be sure a bone was not broken or a ligament did not separate from one of the bones in your ankle (avulsion).  TREATMENT  Certain types of braces can help stabilize your ankle. Your caregiver can make a recommendation for this. Your caregiver may recommend the use of medication for pain. If your sprain is severe, your caregiver may refer you to a surgeon who helps to restore function to parts of your skeletal system (orthopedist) or a physical therapist. HOME CARE INSTRUCTIONS  Apply ice to your injury for 1 to 2 days or as directed by your caregiver. Applying ice helps to reduce inflammation and pain.  Put ice in a plastic bag.   Place a towel between your skin and the bag.   Leave the ice on for 15 to 20 minutes at a time, every 2 hours while you are awake.   Take over-the-counter or prescription medicines for pain, discomfort, or fever only as directed by your caregiver.   Keep your injured leg elevated, when possible, to lessen swelling.   If your caregiver recommends crutches, use them as  instructed. Gradually, put weight on the affected ankle. Continue to use crutches or a cane until you can walk without feeling pain in your ankle.   If you have a plaster splint, wear the splint as directed by your caregiver. Do not rest it on anything harder than a pillow the first 24 hours. Do not put weight on it. Do not get it wet. You may take it off to take a shower or bath.   You may have been given an elastic bandage to wear around your ankle to provide support. If the elastic bandage is too tight (you have numbness or tingling in your foot or your foot becomes cold and blue), adjust the bandage to make it comfortable.   If you have an air splint, you may blow more air into it or let air out to make it more comfortable. You may take your splint off at night and before taking a shower or bath.   Wiggle your toes in the splint several times per day if you are able.  SEEK MEDICAL CARE IF:   You have an increase in bruising, swelling, or pain.   Your toes feel cold.   Pain relief is not achieved with medication.  SEEK IMMEDIATE MEDICAL CARE IF: Your toes are numb or blue or you have severe pain. MAKE SURE YOU:   Understand these instructions.   Will watch your condition.     Will get help right away if you are not doing well or get worse.  Document Released: 01/28/2005 Document Revised: 01/17/2011 Document Reviewed: 09/02/2007 [facility] Patient Information 2012 [place].   You may take the hydrocoodone prescribed for pain relief.  This will make you drowsy - do not drive within 4 hours of taking this medication.

## 2011-07-01 ENCOUNTER — Ambulatory Visit (INDEPENDENT_AMBULATORY_CARE_PROVIDER_SITE_OTHER): Payer: BC Managed Care – PPO | Admitting: Ophthalmology

## 2011-07-01 DIAGNOSIS — E1039 Type 1 diabetes mellitus with other diabetic ophthalmic complication: Secondary | ICD-10-CM

## 2011-07-01 DIAGNOSIS — E11359 Type 2 diabetes mellitus with proliferative diabetic retinopathy without macular edema: Secondary | ICD-10-CM

## 2011-09-27 ENCOUNTER — Ambulatory Visit (INDEPENDENT_AMBULATORY_CARE_PROVIDER_SITE_OTHER): Payer: BC Managed Care – PPO | Admitting: Ophthalmology

## 2011-09-27 DIAGNOSIS — H2510 Age-related nuclear cataract, unspecified eye: Secondary | ICD-10-CM

## 2011-09-27 DIAGNOSIS — H43819 Vitreous degeneration, unspecified eye: Secondary | ICD-10-CM

## 2011-09-27 DIAGNOSIS — E11359 Type 2 diabetes mellitus with proliferative diabetic retinopathy without macular edema: Secondary | ICD-10-CM

## 2011-09-27 DIAGNOSIS — E113599 Type 2 diabetes mellitus with proliferative diabetic retinopathy without macular edema, unspecified eye: Secondary | ICD-10-CM | POA: Diagnosis present

## 2011-09-27 DIAGNOSIS — H3340 Traction detachment of retina, unspecified eye: Secondary | ICD-10-CM | POA: Diagnosis present

## 2011-09-27 DIAGNOSIS — H4310 Vitreous hemorrhage, unspecified eye: Secondary | ICD-10-CM

## 2011-09-27 NOTE — H&P (Signed)
Tracy [name] is an 26 y.o. female.   Chief Complaint: loss of vision OD HPI: longstanding diabetes with proliferative diabetic retinopathy and traction retinal detachment right eye  Past Medical History  Diagnosis Date  . Diabetes mellitus     Past Surgical History  Procedure Date  . Cholecystectomy     No family history on file. Social History:  reports that she has never smoked. She does not have any smokeless tobacco history on file. She reports that she does not drink alcohol or use illicit drugs.  Allergies:  Allergies  Allergen Reactions  . Penicillins   . Sulfa Antibiotics     No prescriptions prior to admission    Review of systems otherwise negative  There were no vitals taken for this visit.  Physical exam: Mental status: oriented x3. Eyes: See eye exam associated with this date of surgery in media tab.  Scanned in by scanning center Ears, Nose, Throat: within normal limits Neck: Within Normal limits General: within normal limits Chest: Within normal limits Breast: deferred Heart: Within normal limits Abdomen: Within normal limits GU: deferred Extremities: within normal limits Skin: within normal limits  Assessment/Plan  longstanding diabetes with proliferative diabetic retinopathy and traction retinal detachment right eye Plan: To [facility] for Pars plana vitrectomy, membrane peel, laser treatment, gas injection right eye Hayden Pedro 09/27/2011, 4:40 PM

## 2011-09-30 ENCOUNTER — Encounter (HOSPITAL_COMMUNITY): Payer: Self-pay

## 2011-09-30 ENCOUNTER — Encounter (HOSPITAL_COMMUNITY): Payer: Self-pay | Admitting: Pharmacy Technician

## 2011-09-30 DIAGNOSIS — H4310 Vitreous hemorrhage, unspecified eye: Secondary | ICD-10-CM | POA: Diagnosis present

## 2011-09-30 MED ORDER — PHENYLEPHRINE HCL 2.5 % OP SOLN: 1.0000 [drp] | OPHTHALMIC | Status: DC | PRN

## 2011-09-30 MED ORDER — GATIFLOXACIN 0.5 % OP SOLN: 1.0000 [drp] | OPHTHALMIC | Status: DC | PRN

## 2011-09-30 MED ORDER — CLINDAMYCIN PHOSPHATE 900 MG/50ML IV SOLN
900.0000 mg | INTRAVENOUS | Status: DC | PRN
  Administered 2011-10-01: 900 mg via INTRAVENOUS
  Filled 2011-09-30: qty 50

## 2011-09-30 MED ORDER — CYCLOPENTOLATE HCL 1 % OP SOLN: 1.0000 [drp] | OPHTHALMIC | Status: DC | PRN

## 2011-09-30 MED ORDER — TROPICAMIDE 1 % OP SOLN: 1.0000 [drp] | OPHTHALMIC | Status: DC | PRN

## 2011-09-30 NOTE — Progress Notes (Signed)
Patient states was instructed by primary physician to keep insulin pump on.

## 2011-10-01 ENCOUNTER — Encounter (HOSPITAL_COMMUNITY): Payer: Self-pay | Admitting: *Deleted

## 2011-10-01 ENCOUNTER — Encounter (HOSPITAL_COMMUNITY): Payer: Self-pay | Admitting: Anesthesiology

## 2011-10-01 ENCOUNTER — Ambulatory Visit (HOSPITAL_COMMUNITY): Payer: BC Managed Care – PPO | Admitting: Anesthesiology

## 2011-10-01 ENCOUNTER — Ambulatory Visit (HOSPITAL_COMMUNITY)
Admission: RE | Admit: 2011-10-01 | Discharge: 2011-10-02 | Disposition: A | Discharge status: Home / Self Care | Payer: BC Managed Care – PPO | Source: Ambulatory Visit | Attending: Ophthalmology | Admitting: Ophthalmology

## 2011-10-01 ENCOUNTER — Ambulatory Visit (HOSPITAL_COMMUNITY): Payer: BC Managed Care – PPO

## 2011-10-01 ENCOUNTER — Encounter (HOSPITAL_COMMUNITY)
Admission: RE | Disposition: A | Discharge status: Home / Self Care | Payer: Self-pay | Source: Ambulatory Visit | Attending: Ophthalmology

## 2011-10-01 DIAGNOSIS — H3340 Traction detachment of retina, unspecified eye: Secondary | ICD-10-CM

## 2011-10-01 DIAGNOSIS — E1139 Type 2 diabetes mellitus with other diabetic ophthalmic complication: Secondary | ICD-10-CM | POA: Insufficient documentation

## 2011-10-01 DIAGNOSIS — E11359 Type 2 diabetes mellitus with proliferative diabetic retinopathy without macular edema: Secondary | ICD-10-CM

## 2011-10-01 DIAGNOSIS — H4310 Vitreous hemorrhage, unspecified eye: Secondary | ICD-10-CM | POA: Insufficient documentation

## 2011-10-01 DIAGNOSIS — E113599 Type 2 diabetes mellitus with proliferative diabetic retinopathy without macular edema, unspecified eye: Secondary | ICD-10-CM | POA: Diagnosis present

## 2011-10-01 HISTORY — PX: PARS PLANA VITRECTOMY: SHX2166

## 2011-10-01 HISTORY — DX: Anemia, unspecified: D64.9

## 2011-10-01 HISTORY — DX: Urinary tract infection, site not specified: N39.0

## 2011-10-01 HISTORY — DX: Nontoxic goiter, unspecified: E04.9

## 2011-10-01 LAB — GLUCOSE, CAPILLARY
Glucose-Capillary: 115 mg/dL — ABNORMAL HIGH (ref 70–99)
Glucose-Capillary: 139 mg/dL — ABNORMAL HIGH (ref 70–99)
Glucose-Capillary: 142 mg/dL — ABNORMAL HIGH (ref 70–99)
Glucose-Capillary: 150 mg/dL — ABNORMAL HIGH (ref 70–99)

## 2011-10-01 LAB — SURGICAL PCR SCREEN
MRSA, PCR: NEGATIVE
Staphylococcus aureus: NEGATIVE

## 2011-10-01 LAB — BASIC METABOLIC PANEL
CO2: 22 mEq/L (ref 19–32)
Calcium: 10 mg/dL (ref 8.4–10.5)
GFR calc Af Amer: 44 mL/min — ABNORMAL LOW (ref >90)
GFR calc non Af Amer: 38 mL/min — ABNORMAL LOW (ref >90)
Sodium: 138 mEq/L (ref 135–145)

## 2011-10-01 LAB — CBC
MCH: 28.4 pg (ref 26.0–34.0)
Platelets: 228 10*3/uL (ref 150–400)
RBC: 3.7 MIL/uL — ABNORMAL LOW (ref 3.87–5.11)
WBC: 8.6 10*3/uL (ref 4.0–10.5)

## 2011-10-01 SURGERY — PARS PLANA VITRECTOMY WITH 25 GAUGE
Anesthesia: General | Site: Eye | Laterality: Right | Wound class: Clean

## 2011-10-01 MED ORDER — ONDANSETRON HCL 4 MG/2ML IJ SOLN
INTRAMUSCULAR | Status: DC | PRN
  Administered 2011-10-01: 4 mg via INTRAVENOUS

## 2011-10-01 MED ORDER — POLYMYXIN B SULFATE 500000 UNITS IJ SOLR
INTRAMUSCULAR | Status: AC
  Filled 2011-10-01: qty 1

## 2011-10-01 MED ORDER — TEMAZEPAM 15 MG PO CAPS: 15.0000 mg | ORAL_CAPSULE | Freq: Every evening | ORAL | Status: DC | PRN

## 2011-10-01 MED ORDER — EPINEPHRINE HCL 1 MG/ML IJ SOLN
INTRAMUSCULAR | Status: AC
  Filled 2011-10-01: qty 1

## 2011-10-01 MED ORDER — DOCUSATE SODIUM 100 MG PO CAPS: 100.0000 mg | ORAL_CAPSULE | Freq: Two times a day (BID) | ORAL | Status: DC

## 2011-10-01 MED ORDER — ONDANSETRON HCL 4 MG/2ML IJ SOLN
4.0000 mg | Freq: Four times a day (QID) | INTRAMUSCULAR | Status: DC | PRN
  Administered 2011-10-01: 4 mg via INTRAVENOUS
  Filled 2011-10-01: qty 2

## 2011-10-01 MED ORDER — BSS IO SOLN
INTRAOCULAR | Status: AC
  Filled 2011-10-01: qty 15

## 2011-10-01 MED ORDER — GATIFLOXACIN 0.5 % OP SOLN
1.0000 [drp] | Freq: Four times a day (QID) | OPHTHALMIC | Status: DC
  Filled 2011-10-01: qty 2.5

## 2011-10-01 MED ORDER — DEXAMETHASONE SODIUM PHOSPHATE 10 MG/ML IJ SOLN
INTRAMUSCULAR | Status: DC | PRN
  Administered 2011-10-01: 10 mg

## 2011-10-01 MED ORDER — LIDOCAINE HCL 4 % MT SOLN
OROMUCOSAL | Status: DC | PRN
  Administered 2011-10-01: 4 mL via TOPICAL

## 2011-10-01 MED ORDER — ACETAZOLAMIDE SODIUM 500 MG IJ SOLR
INTRAMUSCULAR | Status: AC
  Filled 2011-10-01: qty 500

## 2011-10-01 MED ORDER — HYPROMELLOSE (GONIOSCOPIC) 2.5 % OP SOLN
OPHTHALMIC | Status: AC
  Filled 2011-10-01: qty 15

## 2011-10-01 MED ORDER — PROVISC 10 MG/ML IO SOLN
INTRAOCULAR | Status: DC | PRN
  Administered 2011-10-01: .85 mL via INTRAOCULAR

## 2011-10-01 MED ORDER — NEOSTIGMINE METHYLSULFATE 1 MG/ML IJ SOLN
INTRAMUSCULAR | Status: DC | PRN
  Administered 2011-10-01: 4 mg via INTRAVENOUS

## 2011-10-01 MED ORDER — BACITRACIN-POLYMYXIN B 500-10000 UNIT/GM OP OINT
TOPICAL_OINTMENT | OPHTHALMIC | Status: AC
  Filled 2011-10-01: qty 3.5

## 2011-10-01 MED ORDER — TROPICAMIDE 1 % OP SOLN
1.0000 [drp] | OPHTHALMIC | Status: AC | PRN
  Administered 2011-10-01 (×3): 1 [drp] via OPHTHALMIC
  Filled 2011-10-01: qty 3

## 2011-10-01 MED ORDER — BUPIVACAINE HCL 0.75 % IJ SOLN
INTRAMUSCULAR | Status: DC | PRN
  Administered 2011-10-01: 10 mL

## 2011-10-01 MED ORDER — MORPHINE SULFATE 2 MG/ML IJ SOLN
1.0000 mg | INTRAMUSCULAR | Status: AC | PRN
  Administered 2011-10-01 (×2): 2 mg via INTRAVENOUS
  Filled 2011-10-01 (×2): qty 1

## 2011-10-01 MED ORDER — CYCLOPENTOLATE HCL 1 % OP SOLN
1.0000 [drp] | OPHTHALMIC | Status: AC | PRN
  Administered 2011-10-01 (×3): 1 [drp] via OPHTHALMIC
  Filled 2011-10-01: qty 2

## 2011-10-01 MED ORDER — MIDAZOLAM HCL 5 MG/5ML IJ SOLN
INTRAMUSCULAR | Status: DC | PRN
  Administered 2011-10-01: 1 mg via INTRAVENOUS

## 2011-10-01 MED ORDER — PREDNISOLONE ACETATE 1 % OP SUSP
1.0000 [drp] | Freq: Four times a day (QID) | OPHTHALMIC | Status: DC
  Filled 2011-10-01: qty 1

## 2011-10-01 MED ORDER — HYDROMORPHONE HCL PF 1 MG/ML IJ SOLN
0.2500 mg | INTRAMUSCULAR | Status: DC | PRN
  Administered 2011-10-01 (×2): 0.5 mg via INTRAVENOUS

## 2011-10-01 MED ORDER — SODIUM CHLORIDE 0.9 % IJ SOLN
INTRAMUSCULAR | Status: DC | PRN
  Administered 2011-10-01: 14:00:00

## 2011-10-01 MED ORDER — ONDANSETRON HCL 4 MG/2ML IJ SOLN: 4.0000 mg | Freq: Once | INTRAMUSCULAR | Status: DC | PRN

## 2011-10-01 MED ORDER — INSULIN INFUSION PUMP DEVI: Freq: Once | Status: DC

## 2011-10-01 MED ORDER — BSS PLUS IO SOLN
INTRAOCULAR | Status: DC | PRN
  Administered 2011-10-01: 1 via INTRAOCULAR

## 2011-10-01 MED ORDER — ACETAZOLAMIDE SODIUM 500 MG IJ SOLR
500.0000 mg | Freq: Once | INTRAMUSCULAR | Status: AC
  Administered 2011-10-02: 500 mg via INTRAVENOUS
  Filled 2011-10-01 (×2): qty 500

## 2011-10-01 MED ORDER — SODIUM HYALURONATE 10 MG/ML IO SOLN
INTRAOCULAR | Status: AC
  Filled 2011-10-01: qty 0.85

## 2011-10-01 MED ORDER — DEXAMETHASONE SODIUM PHOSPHATE 10 MG/ML IJ SOLN
INTRAMUSCULAR | Status: AC
  Filled 2011-10-01: qty 1

## 2011-10-01 MED ORDER — HYDROCODONE-ACETAMINOPHEN 5-325 MG PO TABS
1.0000 | ORAL_TABLET | ORAL | Status: DC | PRN
  Filled 2011-10-01: qty 2

## 2011-10-01 MED ORDER — GENTAMICIN SULFATE 40 MG/ML IJ SOLN
INTRAMUSCULAR | Status: AC
  Filled 2011-10-01: qty 2

## 2011-10-01 MED ORDER — BUPIVACAINE HCL 0.75 % IJ SOLN
INTRAMUSCULAR | Status: AC
  Filled 2011-10-01: qty 10

## 2011-10-01 MED ORDER — FENTANYL CITRATE 0.05 MG/ML IJ SOLN
INTRAMUSCULAR | Status: DC | PRN
  Administered 2011-10-01: 100 ug via INTRAVENOUS
  Administered 2011-10-01: 25 ug via INTRAVENOUS
  Administered 2011-10-01 (×2): 50 ug via INTRAVENOUS

## 2011-10-01 MED ORDER — GLYCOPYRROLATE 0.2 MG/ML IJ SOLN
INTRAMUSCULAR | Status: DC | PRN
  Administered 2011-10-01: .6 mg via INTRAVENOUS

## 2011-10-01 MED ORDER — ROCURONIUM BROMIDE 100 MG/10ML IV SOLN
INTRAVENOUS | Status: DC | PRN
  Administered 2011-10-01: 30 mg via INTRAVENOUS

## 2011-10-01 MED ORDER — TETRACAINE HCL 0.5 % OP SOLN: 2.0000 [drp] | Freq: Once | OPHTHALMIC | Status: DC

## 2011-10-01 MED ORDER — BSS IO SOLN
INTRAOCULAR | Status: DC | PRN
  Administered 2011-10-01: 15 mL via INTRAOCULAR

## 2011-10-01 MED ORDER — HYDROMORPHONE HCL PF 1 MG/ML IJ SOLN
INTRAMUSCULAR | Status: AC
  Filled 2011-10-01: qty 1

## 2011-10-01 MED ORDER — MUPIROCIN 2 % EX OINT
TOPICAL_OINTMENT | CUTANEOUS | Status: AC
  Administered 2011-10-01: 1 via NASAL
  Filled 2011-10-01: qty 22

## 2011-10-01 MED ORDER — BRIMONIDINE TARTRATE 0.2 % OP SOLN
1.0000 [drp] | Freq: Two times a day (BID) | OPHTHALMIC | Status: DC
  Filled 2011-10-01: qty 5

## 2011-10-01 MED ORDER — HEMOSTATIC AGENTS (NO CHARGE) OPTIME
TOPICAL | Status: DC | PRN
  Administered 2011-10-01: 1 via TOPICAL

## 2011-10-01 MED ORDER — ATROPINE SULFATE 1 % OP SOLN
OPHTHALMIC | Status: DC | PRN
  Administered 2011-10-01: 1 [drp] via OPHTHALMIC

## 2011-10-01 MED ORDER — BACITRACIN-POLYMYXIN B 500-10000 UNIT/GM OP OINT
TOPICAL_OINTMENT | OPHTHALMIC | Status: DC | PRN
  Administered 2011-10-01: 1 via OPHTHALMIC

## 2011-10-01 MED ORDER — SODIUM CHLORIDE 0.9 % IV SOLN
INTRAVENOUS | Status: DC
  Administered 2011-10-01 (×2): via INTRAVENOUS

## 2011-10-01 MED ORDER — BACITRACIN-POLYMYXIN B 500-10000 UNIT/GM OP OINT: 1.0000 "application " | TOPICAL_OINTMENT | Freq: Four times a day (QID) | OPHTHALMIC | Status: DC

## 2011-10-01 MED ORDER — EPINEPHRINE HCL 1 MG/ML IJ SOLN
INTRAOCULAR | Status: DC | PRN
  Administered 2011-10-01: 14:00:00

## 2011-10-01 MED ORDER — ATROPINE SULFATE 1 % OP SOLN
OPHTHALMIC | Status: AC
  Filled 2011-10-01: qty 2

## 2011-10-01 MED ORDER — ACETAMINOPHEN 325 MG PO TABS: 325.0000 mg | ORAL_TABLET | ORAL | Status: DC | PRN

## 2011-10-01 MED ORDER — PHENYLEPHRINE HCL 2.5 % OP SOLN
1.0000 [drp] | OPHTHALMIC | Status: AC | PRN
  Administered 2011-10-01 (×3): 1 [drp] via OPHTHALMIC
  Filled 2011-10-01: qty 3

## 2011-10-01 MED ORDER — GATIFLOXACIN 0.5 % OP SOLN
1.0000 [drp] | OPHTHALMIC | Status: AC | PRN
  Administered 2011-10-01 (×3): 1 [drp] via OPHTHALMIC
  Filled 2011-10-01: qty 2.5

## 2011-10-01 MED ORDER — INSULIN ASPART 100 UNIT/ML ~~LOC~~ SOLN: 0.0000 [IU] | SUBCUTANEOUS | Status: DC

## 2011-10-01 MED ORDER — SODIUM CHLORIDE 0.45 % IV SOLN
INTRAVENOUS | Status: DC
  Administered 2011-10-01: 20 mL/h via INTRAVENOUS

## 2011-10-01 MED ORDER — PROPOFOL 10 MG/ML IV EMUL
INTRAVENOUS | Status: DC | PRN
  Administered 2011-10-01: 200 mg via INTRAVENOUS

## 2011-10-01 MED ORDER — MAGNESIUM HYDROXIDE 400 MG/5ML PO SUSP: 15.0000 mL | Freq: Four times a day (QID) | ORAL | Status: DC | PRN

## 2011-10-01 MED ORDER — INSULIN PUMP
SUBCUTANEOUS | Status: DC
  Administered 2011-10-01: 0.1 via SUBCUTANEOUS
  Administered 2011-10-02: 0.3 via SUBCUTANEOUS
  Administered 2011-10-02: 1 via SUBCUTANEOUS
  Filled 2011-10-01: qty 1

## 2011-10-01 MED ORDER — INSULIN GLARGINE 100 UNIT/ML ~~LOC~~ SOLN: 5.0000 [IU] | Freq: Every day | SUBCUTANEOUS | Status: DC

## 2011-10-01 MED ORDER — BSS PLUS IO SOLN
INTRAOCULAR | Status: AC
  Filled 2011-10-01: qty 500

## 2011-10-01 MED ORDER — LIDOCAINE HCL 2 % IJ SOLN
INTRAMUSCULAR | Status: AC
  Filled 2011-10-01: qty 1

## 2011-10-01 MED ORDER — SODIUM CHLORIDE 0.9 % IV SOLN
INTRAVENOUS | Status: DC | PRN
  Administered 2011-10-01: 14:00:00 via INTRAVENOUS

## 2011-10-01 MED ORDER — LATANOPROST 0.005 % OP SOLN
1.0000 [drp] | Freq: Every day | OPHTHALMIC | Status: DC
  Filled 2011-10-01: qty 2.5

## 2011-10-01 MED ORDER — TRIAMCINOLONE ACETONIDE 40 MG/ML IJ SUSP
INTRAMUSCULAR | Status: AC
  Filled 2011-10-01: qty 1

## 2011-10-01 MED ORDER — LIDOCAINE HCL (CARDIAC) 20 MG/ML IV SOLN
INTRAVENOUS | Status: DC | PRN
  Administered 2011-10-01: 30 mg via INTRAVENOUS

## 2011-10-01 SURGICAL SUPPLY — 72 items
APL SRG 3 HI ABS STRL LF PLS (MISCELLANEOUS)
APPLICATOR DR MATTHEWS STRL (MISCELLANEOUS) IMPLANT
BALL CTTN LRG ABS STRL LF (GAUZE/BANDAGES/DRESSINGS) ×3
BLADE EYE CATARACT 19 1.4 BEAV (BLADE) IMPLANT
BLADE MVR KNIFE 19G (BLADE) IMPLANT
BLADE MVR KNIFE 20G (BLADE) IMPLANT
CANNULA DUAL BORE 23G (CANNULA) IMPLANT
CANNULA FLEX TIP 25G (CANNULA) IMPLANT
CLOTH BEACON ORANGE TIMEOUT ST (SAFETY) ×2 IMPLANT
CORDS BIPOLAR (ELECTRODE) ×1 IMPLANT
COTTONBALL LRG STERILE PKG (GAUZE/BANDAGES/DRESSINGS) ×6 IMPLANT
DRAPE INCISE 51X51 W/FILM STRL (DRAPES) ×1 IMPLANT
DRAPE OPHTHALMIC 77X100 STRL (CUSTOM PROCEDURE TRAY) ×2 IMPLANT
FILTER BLUE MILLIPORE (MISCELLANEOUS) ×1 IMPLANT
FILTER STRAW FLUID ASPIR (MISCELLANEOUS) IMPLANT
FORCEPS ECKARDT ILM 25G SERR (OPHTHALMIC RELATED) IMPLANT
GLOVE SS BIOGEL STRL SZ 6.5 (GLOVE) ×1 IMPLANT
GLOVE SS BIOGEL STRL SZ 7 (GLOVE) ×1 IMPLANT
GLOVE SUPERSENSE BIOGEL SZ 6.5 (GLOVE) ×1
GLOVE SUPERSENSE BIOGEL SZ 7 (GLOVE) ×1
GLOVE SURG 8.5 LATEX PF (GLOVE) ×2 IMPLANT
GLOVE SURG SS PI 6.5 STRL IVOR (GLOVE) ×2 IMPLANT
GOWN STRL NON-REIN LRG LVL3 (GOWN DISPOSABLE) ×6 IMPLANT
ILLUMINATOR CHOW PICK 25GA (MISCELLANEOUS) ×2 IMPLANT
KIT BASIN OR (CUSTOM PROCEDURE TRAY) ×2 IMPLANT
KIT ROOM TURNOVER OR (KITS) ×2 IMPLANT
KNIFE CRESCENT 2.5 55 ANG (BLADE) IMPLANT
LENS BIOM SUPER VIEW SET DISP (OPHTHALMIC RELATED) IMPLANT
MARKER SKIN DUAL TIP RULER LAB (MISCELLANEOUS) IMPLANT
MASK EYE SHIELD (GAUZE/BANDAGES/DRESSINGS) ×1 IMPLANT
MICROPICK 25G (MISCELLANEOUS)
NDL 18GX1X1/2 (RX/OR ONLY) (NEEDLE) ×1 IMPLANT
NDL 25GX 5/8IN NON SAFETY (NEEDLE) ×1 IMPLANT
NDL FILTER BLUNT 18X1 1/2 (NEEDLE) ×1 IMPLANT
NDL HYPO 30X.5 LL (NEEDLE) ×1 IMPLANT
NEEDLE 18GX1X1/2 (RX/OR ONLY) (NEEDLE) ×2 IMPLANT
NEEDLE 25GX 5/8IN NON SAFETY (NEEDLE) ×2 IMPLANT
NEEDLE 27GAX1X1/2 (NEEDLE) IMPLANT
NEEDLE FILTER BLUNT 18X 1/2SAF (NEEDLE) ×1
NEEDLE FILTER BLUNT 18X1 1/2 (NEEDLE) ×1 IMPLANT
NEEDLE HYPO 30X.5 LL (NEEDLE) ×2 IMPLANT
NS IRRIG 1000ML POUR BTL (IV SOLUTION) ×2 IMPLANT
PACK VITRECTOMY CUSTOM (CUSTOM PROCEDURE TRAY) ×2 IMPLANT
PAD ARMBOARD 7.5X6 YLW CONV (MISCELLANEOUS) ×4 IMPLANT
PAD EYE OVAL STERILE LF (GAUZE/BANDAGES/DRESSINGS) ×1 IMPLANT
PAK VITRECTOMY PIK 25 GA (OPHTHALMIC RELATED) ×2 IMPLANT
PENCIL BIPOLAR 25GA STR DISP (OPHTHALMIC RELATED) ×1 IMPLANT
PICK MICROPICK 25G (MISCELLANEOUS) IMPLANT
PROBE DIRECTIONAL LASER (MISCELLANEOUS) ×1 IMPLANT
REPL STRA BRUSH NDL (NEEDLE) IMPLANT
REPL STRA BRUSH NEEDLE (NEEDLE) IMPLANT
RESERVOIR BACK FLUSH (MISCELLANEOUS) IMPLANT
ROLLS DENTAL (MISCELLANEOUS) ×4 IMPLANT
SCRAPER DIAMOND DUST MEMBRANE (MISCELLANEOUS) IMPLANT
SPONGE SURGIFOAM ABS GEL 12-7 (HEMOSTASIS) ×2 IMPLANT
STOPCOCK 4 WAY LG BORE MALE ST (IV SETS) IMPLANT
SUT CHROMIC 7 0 TG140 8 (SUTURE) IMPLANT
SUT ETHILON 10 0 CS140 6 (SUTURE) IMPLANT
SUT ETHILON 9 0 TG140 8 (SUTURE) IMPLANT
SUT POLY NON ABSORB 10-0 8 STR (SUTURE) IMPLANT
SUT SILK 4 0 RB 1 (SUTURE) IMPLANT
SYR 20CC LL (SYRINGE) ×2 IMPLANT
SYR 5ML LL (SYRINGE) IMPLANT
SYR BULB 3OZ (MISCELLANEOUS) ×2 IMPLANT
SYR TB 1ML LUER SLIP (SYRINGE) ×2 IMPLANT
SYRINGE 10CC LL (SYRINGE) IMPLANT
TAPE SURG TRANSPORE 1 IN (GAUZE/BANDAGES/DRESSINGS) IMPLANT
TAPE SURGICAL TRANSPORE 1 IN (GAUZE/BANDAGES/DRESSINGS) ×1
TOWEL OR 17X24 6PK STRL BLUE (TOWEL DISPOSABLE) ×6 IMPLANT
TROCAR CANNULA 25GA (CANNULA) IMPLANT
WATER STERILE IRR 1000ML POUR (IV SOLUTION) ×2 IMPLANT
WIPE INSTRUMENT VISIWIPE 73X73 (MISCELLANEOUS) ×2 IMPLANT

## 2011-10-01 NOTE — Plan of Care (Signed)
Problem: Consults Goal: Diabetes Guidelines if Diabetic/Glucose > 140 If diabetic or lab glucose is > 140 mg/dl - Initiate Diabetes/Hyperglycemia Guidelines & Document Interventions  Outcome: Progressing Patient has insulin pump. Diabetes coordinator called for resource  Problem: Phase I Progression Outcomes Goal: Tolerates PO diet Outcome: Not Met (add Reason) C/o nausea/vomiting with movement

## 2011-10-01 NOTE — Anesthesia Procedure Notes (Signed)
Procedure Name: Intubation Date/Time: 10/01/2011 1:37 PM Performed by: Vaughan Browner Pre-anesthesia Checklist: Patient identified, Emergency Drugs available, Suction available and Patient being monitored Patient Re-evaluated:Patient Re-evaluated prior to inductionOxygen Delivery Method: Circle system utilized Preoxygenation: Pre-oxygenation with 100% oxygen Intubation Type: IV induction Ventilation: Mask ventilation without difficulty Laryngoscope Size: Mac and 3 Grade View: Grade I Tube type: Oral Tube size: 7.0 mm Number of attempts: 1 Airway Equipment and Method: Stylet and LTA kit utilized Placement Confirmation: ETT inserted through vocal cords under direct vision,  positive ETCO2 and breath sounds checked- equal and bilateral Secured at: 22 cm Tube secured with: Tape Dental Injury: Teeth and Oropharynx as per pre-operative assessment

## 2011-10-01 NOTE — Brief Op Note (Signed)
Brief Operative note   Preoperative diagnosis:  Pre-Op Diagnosis Codes:    * Vitreous hemorrhage [379.23] Postoperative diagnosis  Post-Op Diagnosis Codes:    * Vitreous hemorrhage [379.23]  Procedures: Repair of complex traction retinal detachment with vitrectomy, laser, diathermy, gas injection right eye  Surgeon:  Hayden Pedro, MD...  Assistant:  Deatra Ina SA   Anesthesia: General  Specimen: none  Estimated blood loss:  1cc  Complications: none  Patient sent to PACU in good condition  Composed by Hayden Pedro MD  Dictation number: [phone]

## 2011-10-01 NOTE — Progress Notes (Signed)
Patient admitted to [facility] room 6.  Dx: s/p right eye PPV. Eye dressing/shiel intact. Oriented to room. Call bell in reach. Family at bedside.

## 2011-10-01 NOTE — Anesthesia Postprocedure Evaluation (Signed)
  Anesthesia Post-op Note  Patient: Tracy [name]  Procedure(s) Performed: Procedure(s) (LRB): PARS PLANA VITRECTOMY WITH 25 GAUGE (Right) DIODE LASER APPLICATION (Right)  Patient Location: PACU  Anesthesia Type: General  Level of Consciousness: awake, alert  and oriented  Airway and Oxygen Therapy: Patient Spontanous Breathing  Post-op Pain: none  Post-op Assessment: Post-op Vital signs reviewed, Patient's Cardiovascular Status Stable, Respiratory Function Stable, Patent Airway, No signs of Nausea or vomiting and Pain level controlled  Post-op Vital Signs: Reviewed and stable  Complications: No apparent anesthesia complications

## 2011-10-01 NOTE — Progress Notes (Signed)
NOTIFIED DR Tracy [name] OF PATIENT HAVING INSULIN PUMP SHE IS WEARING, CBG IS 107.

## 2011-10-01 NOTE — Anesthesia Preprocedure Evaluation (Addendum)
Anesthesia Evaluation  Patient identified by MRN, date of birth, ID band Patient awake    Reviewed: Allergy & Precautions, H&P , NPO status , Patient's Chart, lab work & pertinent test results  Airway Mallampati: I TM Distance: >3 FB Neck ROM: full    Dental   Pulmonary          Cardiovascular Rhythm:regular Rate:Normal     Neuro/Psych    GI/Hepatic   Endo/Other  Well Controlled, Type 1, Insulin Dependent  Renal/GU      Musculoskeletal   Abdominal   Peds  Hematology   Anesthesia Other Findings   Reproductive/Obstetrics                           Anesthesia Physical Anesthesia Plan  ASA: III  Anesthesia Plan: General   Post-op Pain Management:    Induction: Intravenous  Airway Management Planned: Oral ETT  Additional Equipment:   Intra-op Plan:   Post-operative Plan: Extubation in OR  Informed Consent: I have reviewed the patients History and Physical, chart, labs and discussed the procedure including the risks, benefits and alternatives for the proposed anesthesia with the patient or authorized representative who has indicated his/her understanding and acceptance.     Plan Discussed with: CRNA, Anesthesiologist and Surgeon  Anesthesia Plan Comments:         Anesthesia Quick Evaluation

## 2011-10-01 NOTE — H&P (Signed)
I examined the patient today and there is no change in the medical status 

## 2011-10-01 NOTE — Transfer of Care (Signed)
Immediate Anesthesia Transfer of Care Note  Patient: Tracy [name]  Procedure(s) Performed: Procedure(s) (LRB): PARS PLANA VITRECTOMY WITH 25 GAUGE (Right) DIODE LASER APPLICATION (Right)  Patient Location: PACU  Anesthesia Type: General  Level of Consciousness: awake, alert  and oriented  Airway & Oxygen Therapy: Patient Spontanous Breathing and Patient connected to nasal cannula oxygen  Post-op Assessment: Report given to PACU RN, Post -op Vital signs reviewed and stable and Patient moving all extremities  Post vital signs: Reviewed and stable  Complications: No apparent anesthesia complications

## 2011-10-02 ENCOUNTER — Encounter (HOSPITAL_COMMUNITY): Payer: Self-pay | Admitting: Ophthalmology

## 2011-10-02 LAB — HEMOGLOBIN A1C
Hgb A1c MFr Bld: 13.3 % — ABNORMAL HIGH (ref <5.7)
Mean Plasma Glucose: 335 mg/dL — ABNORMAL HIGH (ref <117)

## 2011-10-02 MED ORDER — PREDNISOLONE ACETATE 1 % OP SUSP: 1.0000 [drp] | Freq: Four times a day (QID) | OPHTHALMIC | Status: AC

## 2011-10-02 MED ORDER — BACITRACIN-POLYMYXIN B 500-10000 UNIT/GM OP OINT: 1.0000 "application " | TOPICAL_OINTMENT | Freq: Four times a day (QID) | OPHTHALMIC | Status: AC

## 2011-10-02 MED ORDER — GATIFLOXACIN 0.5 % OP SOLN: 1.0000 [drp] | Freq: Four times a day (QID) | OPHTHALMIC | Status: DC

## 2011-10-02 NOTE — Discharge Summary (Signed)
Discharge summary not needed on [facility] patients per medical records. 

## 2011-10-02 NOTE — Progress Notes (Signed)
Discharge patient.

## 2011-10-02 NOTE — Op Note (Signed)
NAMEMAGDLINE, MAKI             ACCOUNT NO.:  [id number]  MEDICAL RECORD NO.:  [id number]  LOCATION:  6N06C                        FACILITY:  [facility]  PHYSICIAN:  Chrystie Nose. Zigmund Daniel, M.D. DATE OF BIRTH:  Jan 08, 1986  DATE OF PROCEDURE:  10/01/2011 DATE OF DISCHARGE:                              OPERATIVE REPORT   ADMISSION DIAGNOSES:  Complex traction retinal detachment with vitreous hemorrhage proliferative diabetic retinopathy.  PROCEDURE:  Repair of complex traction retinal detachment with pars plana vitrectomy, retinal photocoagulation, gas-fluid exchange, membrane peel, endodiathermy, all in the right eye.  SURGEON:  Chrystie Nose. Zigmund Daniel, M.D.  ASSISTANT:  Deatra Ina, SA.  ANESTHESIA:  General.  DETAILS:  Usual prep and drape, 25-gauge trocars placed at 8, 10, and 2 o'clock.  Provisc placed on the corneal surface.  An infusion was placed at 8 o'clock.  The lighted pick and the cutter were placed at 10 and 2 o'clock respectively.  The pars plana vitrectomy was begun just behind the crystalline lens.  Large clots of dark-red blood were encountered. These were carefully removed under low suction and rapid cutting.  The macula became visible and there was traction detachment inferior nasal and superiorly.  These areas of traction detachment were circumcised with the vitreous cutter and endodiathermy was used for hemostasis.  The large area of traction detachment nasally was trimmed down to the retinal surface and all surface proliferation was removed.  Once the core vitrectomy was completed, the mid vitreous was entered and the vitrectomy was used to remove the mid vitreous with low suction and rapid cutting.  Surface proliferation was also removed.  The Wide-Field BIOM viewing system was moved into place and the vitrectomy was carried down to the vitreous base.  Scleral depression was used at 6 o'clock to gain access to this region and large clots of blood were seen and removed  in this area.  Once this was accomplished and all traction was removed, the endolaser was positioned in the eye, 745 burns placed around the retinal periphery, the power was a 1000 mW, 1000 microns each, and 0.1 seconds each.  Hemostasis again was obtained with endodiathermy.  A 100% gas-fluid exchange was performed with suction via the silicone-tip suction line and small pockets of blood were removed until no blood was remaining.  The instruments were removed from the eye.  The trocars were removed from the eye.  Additional injection was required at 12 o'clock to obtain a closing pressure of 10 with a Barraquer tonometer.  Polymyxin and gentamicin were irrigated to tenon space.  Atropine solution was applied.  Marcaine was injected around the globe for postop pain.  Decadron 10 mg was injected into the lower subconjunctival space.  Closing pressure was 10 with a Barraquer tonometer.  Polysporin patch and shield were placed.  The patient was awakened and taken to recovery in satisfactory condition.  COMPLICATIONS:  None.  DURATION:  1 hour.     Chrystie Nose. Zigmund Daniel, M.D.     JDM/MEDQ  D:  10/01/2011  T:  10/02/2011  Job:  [id number]

## 2011-10-02 NOTE — Progress Notes (Signed)
10/02/2011, 6:46 AM  Mental Status:  Awake, Alert, Oriented  Anterior segment: Cornea  Clear    Anterior Chamber Clear    Lens:   Clear  Intra Ocular Pressure 09 mmHg with Tonopen  Vitreous: Clear 95%gas bubble   Retina:  Attached Good laser reaction   Impression: Excellent result Retina attached   Final Diagnosis: Principal Problem:  *Traction retinal detachment Active Problems:  Proliferative diabetic retinopathy  Vitreous hemorrhage   Plan: start post operative eye drops.  Discharge to home.  Give post operative instructions  Tracy [name] 10/02/2011, 6:46 AM

## 2011-10-08 ENCOUNTER — Inpatient Hospital Stay (INDEPENDENT_AMBULATORY_CARE_PROVIDER_SITE_OTHER): Payer: BC Managed Care – PPO | Admitting: Ophthalmology

## 2011-10-08 DIAGNOSIS — E11359 Type 2 diabetes mellitus with proliferative diabetic retinopathy without macular edema: Secondary | ICD-10-CM

## 2011-10-08 DIAGNOSIS — E1039 Type 1 diabetes mellitus with other diabetic ophthalmic complication: Secondary | ICD-10-CM

## 2011-10-30 ENCOUNTER — Encounter (INDEPENDENT_AMBULATORY_CARE_PROVIDER_SITE_OTHER): Payer: BC Managed Care – PPO | Admitting: Ophthalmology

## 2011-10-30 DIAGNOSIS — E1065 Type 1 diabetes mellitus with hyperglycemia: Secondary | ICD-10-CM

## 2011-10-30 DIAGNOSIS — E11359 Type 2 diabetes mellitus with proliferative diabetic retinopathy without macular edema: Secondary | ICD-10-CM

## 2011-11-01 ENCOUNTER — Ambulatory Visit (INDEPENDENT_AMBULATORY_CARE_PROVIDER_SITE_OTHER): Payer: BC Managed Care – PPO | Admitting: Ophthalmology

## 2011-12-20 ENCOUNTER — Other Ambulatory Visit: Payer: Self-pay | Admitting: Adult Health

## 2011-12-20 ENCOUNTER — Other Ambulatory Visit (HOSPITAL_COMMUNITY)
Admission: RE | Admit: 2011-12-20 | Discharge: 2011-12-20 | Disposition: A | Discharge status: Home / Self Care | Payer: BC Managed Care – PPO | Source: Ambulatory Visit | Attending: Obstetrics and Gynecology | Admitting: Obstetrics and Gynecology

## 2011-12-20 DIAGNOSIS — Z113 Encounter for screening for infections with a predominantly sexual mode of transmission: Secondary | ICD-10-CM | POA: Insufficient documentation

## 2011-12-20 DIAGNOSIS — Z01419 Encounter for gynecological examination (general) (routine) without abnormal findings: Secondary | ICD-10-CM | POA: Insufficient documentation

## 2011-12-31 ENCOUNTER — Encounter (INDEPENDENT_AMBULATORY_CARE_PROVIDER_SITE_OTHER): Payer: BC Managed Care – PPO | Admitting: Ophthalmology

## 2011-12-31 DIAGNOSIS — E1039 Type 1 diabetes mellitus with other diabetic ophthalmic complication: Secondary | ICD-10-CM

## 2011-12-31 DIAGNOSIS — H25049 Posterior subcapsular polar age-related cataract, unspecified eye: Secondary | ICD-10-CM

## 2011-12-31 DIAGNOSIS — E1065 Type 1 diabetes mellitus with hyperglycemia: Secondary | ICD-10-CM

## 2011-12-31 DIAGNOSIS — H43819 Vitreous degeneration, unspecified eye: Secondary | ICD-10-CM

## 2011-12-31 DIAGNOSIS — E11359 Type 2 diabetes mellitus with proliferative diabetic retinopathy without macular edema: Secondary | ICD-10-CM

## 2012-01-08 ENCOUNTER — Encounter (INDEPENDENT_AMBULATORY_CARE_PROVIDER_SITE_OTHER): Payer: BC Managed Care – PPO | Admitting: Ophthalmology

## 2012-05-22 ENCOUNTER — Telehealth: Payer: Self-pay | Admitting: *Deleted

## 2012-05-22 NOTE — Telephone Encounter (Signed)
On depo for over a year, now having irregular dark red bleeding and at times bright red since Saturday. Pt states under a lot of stress.  Informed Pt not abnormal to have irregular bleeding with Depo, and stress can effect menstrual cycle, to continue to monitor if no improvement call office back for an appt. Pt verbalized understanding.

## 2012-06-02 ENCOUNTER — Telehealth: Payer: Self-pay | Admitting: *Deleted

## 2012-06-02 MED ORDER — FLUCONAZOLE 150 MG PO TABS: ORAL_TABLET | ORAL | Status: DC

## 2012-06-02 NOTE — Telephone Encounter (Signed)
C/o vaginal itching and burning with urination, thinks she has yeast infections.    1. Requesting RX for Diflucan.

## 2012-06-02 NOTE — Telephone Encounter (Signed)
Pt. Requests diflucan for yeast and it was sent to [facility]

## 2012-06-29 ENCOUNTER — Ambulatory Visit (INDEPENDENT_AMBULATORY_CARE_PROVIDER_SITE_OTHER): Payer: BC Managed Care – PPO | Admitting: Ophthalmology

## 2012-07-13 ENCOUNTER — Ambulatory Visit (INDEPENDENT_AMBULATORY_CARE_PROVIDER_SITE_OTHER): Payer: BC Managed Care – PPO | Admitting: Ophthalmology

## 2012-07-13 DIAGNOSIS — E11359 Type 2 diabetes mellitus with proliferative diabetic retinopathy without macular edema: Secondary | ICD-10-CM

## 2012-07-13 DIAGNOSIS — H43819 Vitreous degeneration, unspecified eye: Secondary | ICD-10-CM

## 2012-07-13 DIAGNOSIS — H2510 Age-related nuclear cataract, unspecified eye: Secondary | ICD-10-CM

## 2012-07-13 DIAGNOSIS — E1065 Type 1 diabetes mellitus with hyperglycemia: Secondary | ICD-10-CM

## 2012-07-15 ENCOUNTER — Encounter: Payer: Self-pay | Admitting: *Deleted

## 2012-07-16 ENCOUNTER — Ambulatory Visit: Payer: Self-pay

## 2012-07-17 ENCOUNTER — Encounter: Payer: Self-pay | Admitting: Obstetrics & Gynecology

## 2012-07-17 ENCOUNTER — Ambulatory Visit (INDEPENDENT_AMBULATORY_CARE_PROVIDER_SITE_OTHER): Payer: BC Managed Care – PPO | Admitting: Obstetrics & Gynecology

## 2012-07-17 VITALS — BP 152/90 | Wt 145.5 lb

## 2012-07-17 DIAGNOSIS — Z3202 Encounter for pregnancy test, result negative: Secondary | ICD-10-CM

## 2012-07-17 DIAGNOSIS — Z3049 Encounter for surveillance of other contraceptives: Secondary | ICD-10-CM

## 2012-07-17 LAB — POCT URINE PREGNANCY: Preg Test, Ur: NEGATIVE

## 2012-07-17 MED ORDER — MEDROXYPROGESTERONE ACETATE 150 MG/ML IM SUSP
150.0000 mg | Freq: Once | INTRAMUSCULAR | Status: AC
  Administered 2012-07-17: 150 mg via INTRAMUSCULAR

## 2012-07-18 ENCOUNTER — Other Ambulatory Visit: Payer: Self-pay | Admitting: Adult Health

## 2012-07-23 ENCOUNTER — Telehealth: Payer: Self-pay | Admitting: Adult Health

## 2012-07-23 MED ORDER — ACYCLOVIR 400 MG PO TABS: 400.0000 mg | ORAL_TABLET | Freq: Two times a day (BID) | ORAL | Status: DC

## 2012-07-23 NOTE — Telephone Encounter (Signed)
Needs refill on acyclovir, will refill at [facility]

## 2012-08-11 ENCOUNTER — Encounter (INDEPENDENT_AMBULATORY_CARE_PROVIDER_SITE_OTHER): Payer: BC Managed Care – PPO | Admitting: Ophthalmology

## 2012-08-11 DIAGNOSIS — E1039 Type 1 diabetes mellitus with other diabetic ophthalmic complication: Secondary | ICD-10-CM

## 2012-08-11 DIAGNOSIS — E11359 Type 2 diabetes mellitus with proliferative diabetic retinopathy without macular edema: Secondary | ICD-10-CM

## 2012-09-11 ENCOUNTER — Other Ambulatory Visit: Payer: Self-pay | Admitting: Adult Health

## 2012-10-02 ENCOUNTER — Ambulatory Visit: Payer: BC Managed Care – PPO | Admitting: Endocrinology

## 2012-10-09 ENCOUNTER — Ambulatory Visit: Payer: BC Managed Care – PPO

## 2012-10-13 ENCOUNTER — Encounter: Payer: Self-pay | Admitting: Adult Health

## 2012-10-13 ENCOUNTER — Ambulatory Visit (INDEPENDENT_AMBULATORY_CARE_PROVIDER_SITE_OTHER): Payer: BC Managed Care – PPO | Admitting: Adult Health

## 2012-10-13 VITALS — BP 150/80 | Ht 63.0 in | Wt 149.0 lb

## 2012-10-13 DIAGNOSIS — Z3049 Encounter for surveillance of other contraceptives: Secondary | ICD-10-CM

## 2012-10-13 DIAGNOSIS — Z309 Encounter for contraceptive management, unspecified: Secondary | ICD-10-CM

## 2012-10-13 DIAGNOSIS — Z3202 Encounter for pregnancy test, result negative: Secondary | ICD-10-CM

## 2012-10-13 DIAGNOSIS — Z3200 Encounter for pregnancy test, result unknown: Secondary | ICD-10-CM

## 2012-10-13 LAB — POCT URINE PREGNANCY: Preg Test, Ur: NEGATIVE

## 2012-10-13 MED ORDER — MEDROXYPROGESTERONE ACETATE 150 MG/ML IM SUSP
150.0000 mg | Freq: Once | INTRAMUSCULAR | Status: AC
  Administered 2012-10-13: 150 mg via INTRAMUSCULAR

## 2012-10-15 ENCOUNTER — Ambulatory Visit (INDEPENDENT_AMBULATORY_CARE_PROVIDER_SITE_OTHER): Payer: BC Managed Care – PPO | Admitting: Endocrinology

## 2012-10-15 ENCOUNTER — Encounter: Payer: Self-pay | Admitting: Endocrinology

## 2012-10-15 VITALS — BP 136/80 | HR 90 | Ht 64.0 in | Wt 151.0 lb

## 2012-10-15 DIAGNOSIS — E039 Hypothyroidism, unspecified: Secondary | ICD-10-CM | POA: Insufficient documentation

## 2012-10-15 DIAGNOSIS — E1039 Type 1 diabetes mellitus with other diabetic ophthalmic complication: Secondary | ICD-10-CM

## 2012-10-15 NOTE — Progress Notes (Signed)
Subjective:    Patient ID: Tracy [name], female    DOB: 1985-10-22, 27 y.o.   MRN: [id number]  HPI pt states 15 years h/o dm.  she has mild neuropathy of the lower extremities, and associated retinopathy.  he has been on insulin since dx.  She was on pump therapy until 2013, when it was stopped due to poor control.  pt says her diet and exercise are good.   She has mild hypoglycemia approximartly once a week.  She seldom checks cbg's, and takes a widely varying dosage of insulin.  She says she is very discouraged about her DM in general.   Past Medical History  Diagnosis Date  . Thyroid enlargement     "not on medication at this time"  . Urinary tract infection     hx of  . Anemia     presently on iron supplement  . Diabetes mellitus     insulin pump   . Hypothyroidism   . Anxiety   . HSV-2 (herpes simplex virus 2) infection   . HSV-1 (herpes simplex virus 1) infection     Past Surgical History  Procedure Laterality Date  . Cholecystectomy    . Pilonidal cyst excision    . Wisdom tooth extraction    . Pars plana vitrectomy  10/01/2011    Procedure: PARS PLANA VITRECTOMY WITH 25 GAUGE;  Surgeon: Hayden Pedro, MD;  Location: [place];  Service: Ophthalmology;  Laterality: Right;  Repair Complex Traction Retinal Detachment    History   Social History  . Marital Status: Divorced    Spouse Name: N/A    Number of Children: N/A  . Years of Education: N/A   Occupational History  . Not on file.   Social History Main Topics  . Smoking status: Never Smoker   . Smokeless tobacco: Never Used  . Alcohol Use: No  . Drug Use: No  . Sexual Activity: Yes    Birth Control/ Protection: Injection   Other Topics Concern  . Not on file   Social History Narrative  . No narrative on file    Current Outpatient Prescriptions on File Prior to Visit  Medication Sig Dispense Refill  . acyclovir (ZOVIRAX) 400 MG tablet Take 1 tablet (400 mg total) by mouth 2 (two) times daily.  60  tablet  11  . fluconazole (DIFLUCAN) 150 MG tablet TAKE 1 NOW AND 1 IN 3 DAYS IF NEEDED  2 tablet  1  . gatifloxacin (ZYMAXID) 0.5 % SOLN Place 1 drop into the right eye 4 (four) times daily.      Marland Kitchen HUMALOG KWIKPEN 100 UNIT/ML SOPN       . Insulin Infusion Pump DEVI by Does not apply route. Humalog insulin      . LANTUS SOLOSTAR 100 UNIT/ML SOPN       . medroxyPROGESTERone (DEPO-PROVERA) 150 MG/ML injection       . NOVOFINE 30G X 8 MM MISC        No current facility-administered medications on file prior to visit.    Allergies  Allergen Reactions  . Penicillins Hives  . Sulfa Antibiotics Hives    Family History  Problem Relation Age of Onset  . Hypertension Other   . Stroke Other   . Diabetes Other   . Cancer Other   . Coronary artery disease Other   DM: none  BP 136/80  Pulse 90  Ht 5\' 4"  (1.626 m)  Wt 151 lb (68.493 kg)  BMI  25.91 kg/m2  SpO2 97%  LMP 09/29/2012  Review of Systems denies weight loss, blurry vision, chest pain, n/v, excessive diaphoresis, depression, hypoglycemia, rhinorrhea, and easy bruising.  She has cold intolerance, leg cramps, urinary frequency, palpitations, headache, and doe.      Objective:   Physical Exam VS: see vs page GEN: no distress HEAD: head: no deformity eyes: no periorbital swelling, no proptosis external nose and ears are normal mouth: no lesion seen NECK: supple, thyroid is 2-3 times normal size, (L>R), with irregular surface.  No nodule is palpable.   CHEST WALL: no deformity LUNGS:  Clear to auscultation. CV: reg rate and rhythm, no murmur ABD: abdomen is soft, nontender.  no hepatosplenomegaly.  not distended.  no hernia MUSCULOSKELETAL: muscle bulk and strength are grossly normal.  no obvious joint swelling.  gait is normal and steady PULSES: no carotid bruit NEURO:  cn 2-12 grossly intact.   readily moves all 4's.   SKIN:  Normal texture and temperature.  No rash or suspicious lesion is visible.   NODES:  None palpable  at the neck PSYCH: alert, oriented x3.  Does not appear anxious nor depressed.   outside test results are reviewed: (pt says her a1c was recently 16%)    Assessment & Plan:  DM: she has severe hyperglyemia.  This causes severe risk to her health. Hypothyroidism, with goiter, on replacement.  In this setting, she is at risk for other autoimmune problems. Palpitations: not thyroid-related Urinary frequency, due to severe hyperglycemia.

## 2012-10-15 NOTE — Patient Instructions (Addendum)
good diet and exercise habits significanly improve the control of your diabetes.  please let me know if you wish to be referred to a dietician.  high blood sugar is very risky to your health.  you should see an eye doctor every year.  You are at higher than average risk for pneumonia and hepatitis-B.  You should be vaccinated against both.   controlling your blood pressure and cholesterol drastically reduces the damage diabetes does to your body.  this also applies to quitting smoking.  please discuss these with your doctor.  check your blood sugar 4 times a day: before the 3 meals, and at bedtime.  also check if you have symptoms of your blood sugar being too high or too low.  please keep a record of the readings and bring it to your next appointment here.  please call us sooner if your blood sugar goes below 70, or if you have a lot of readings over 200. In view of your diabetes, you should avoid pregnancy until we have decided it is safe.  please take the insulin numbers as noted below.   Please come back for a follow-up appointment in 2 weeks.   we will need to take this complex situation in stages.

## 2012-10-16 DIAGNOSIS — E1039 Type 1 diabetes mellitus with other diabetic ophthalmic complication: Secondary | ICD-10-CM | POA: Insufficient documentation

## 2012-10-29 ENCOUNTER — Ambulatory Visit: Payer: BC Managed Care – PPO | Admitting: Endocrinology

## 2012-12-03 ENCOUNTER — Other Ambulatory Visit: Payer: Self-pay | Admitting: Adult Health

## 2012-12-10 ENCOUNTER — Emergency Department (HOSPITAL_COMMUNITY)
Admission: EM | Admit: 2012-12-10 | Discharge: 2012-12-10 | Disposition: A | Discharge status: Home / Self Care | Payer: BC Managed Care – PPO | Attending: Emergency Medicine | Admitting: Emergency Medicine

## 2012-12-10 ENCOUNTER — Encounter (HOSPITAL_COMMUNITY): Payer: Self-pay | Admitting: Emergency Medicine

## 2012-12-10 DIAGNOSIS — Z79899 Other long term (current) drug therapy: Secondary | ICD-10-CM | POA: Insufficient documentation

## 2012-12-10 DIAGNOSIS — E119 Type 2 diabetes mellitus without complications: Secondary | ICD-10-CM | POA: Insufficient documentation

## 2012-12-10 DIAGNOSIS — Z87440 Personal history of urinary (tract) infections: Secondary | ICD-10-CM | POA: Insufficient documentation

## 2012-12-10 DIAGNOSIS — Z794 Long term (current) use of insulin: Secondary | ICD-10-CM | POA: Insufficient documentation

## 2012-12-10 DIAGNOSIS — Z8619 Personal history of other infectious and parasitic diseases: Secondary | ICD-10-CM | POA: Insufficient documentation

## 2012-12-10 DIAGNOSIS — Z880 Allergy status to penicillin: Secondary | ICD-10-CM | POA: Insufficient documentation

## 2012-12-10 DIAGNOSIS — Z8669 Personal history of other diseases of the nervous system and sense organs: Secondary | ICD-10-CM | POA: Insufficient documentation

## 2012-12-10 DIAGNOSIS — E039 Hypothyroidism, unspecified: Secondary | ICD-10-CM | POA: Insufficient documentation

## 2012-12-10 DIAGNOSIS — D649 Anemia, unspecified: Secondary | ICD-10-CM | POA: Insufficient documentation

## 2012-12-10 DIAGNOSIS — R739 Hyperglycemia, unspecified: Secondary | ICD-10-CM

## 2012-12-10 HISTORY — DX: Serous retinal detachment, unspecified eye: H33.20

## 2012-12-10 HISTORY — DX: Candidiasis, unspecified: B37.9

## 2012-12-10 LAB — GLUCOSE, CAPILLARY: Glucose-Capillary: >600 mg/dL (ref 70–99)

## 2012-12-10 LAB — CBC WITH DIFFERENTIAL/PLATELET
Basophils Absolute: 0.1 10*3/uL (ref 0.0–0.1)
Basophils Relative: 1 % (ref 0–1)
Eosinophils Absolute: 0.1 10*3/uL (ref 0.0–0.7)
HCT: 30.7 % — ABNORMAL LOW (ref 36.0–46.0)
Hemoglobin: 11 g/dL — ABNORMAL LOW (ref 12.0–15.0)
Lymphocytes Relative: 19 % (ref 12–46)
MCH: 29.6 pg (ref 26.0–34.0)
MCHC: 35.8 g/dL (ref 30.0–36.0)
MCV: 82.5 fL (ref 78.0–100.0)
Monocytes Absolute: 0.4 10*3/uL (ref 0.1–1.0)
Neutro Abs: 6.5 10*3/uL (ref 1.7–7.7)
Neutrophils Relative %: 75 % (ref 43–77)
RDW: 12.1 % (ref 11.5–15.5)
WBC: 8.7 10*3/uL (ref 4.0–10.5)

## 2012-12-10 LAB — URINALYSIS, ROUTINE W REFLEX MICROSCOPIC
Glucose, UA: >1000 mg/dL — AB
Leukocytes, UA: NEGATIVE
Nitrite: NEGATIVE
Specific Gravity, Urine: 1.02 (ref 1.005–1.030)
Urobilinogen, UA: 0.2 mg/dL (ref 0.0–1.0)
pH: 6 (ref 5.0–8.0)

## 2012-12-10 LAB — URINE MICROSCOPIC-ADD ON

## 2012-12-10 LAB — BASIC METABOLIC PANEL
BUN: 41 mg/dL — ABNORMAL HIGH (ref 6–23)
CO2: 22 mEq/L (ref 19–32)
Calcium: 10.6 mg/dL — ABNORMAL HIGH (ref 8.4–10.5)
Chloride: 83 mEq/L — ABNORMAL LOW (ref 96–112)
GFR calc Af Amer: 35 mL/min — ABNORMAL LOW (ref >90)
GFR calc non Af Amer: 31 mL/min — ABNORMAL LOW (ref >90)
Potassium: 3.7 mEq/L (ref 3.5–5.1)

## 2012-12-10 MED ORDER — SODIUM CHLORIDE 0.9 % IV BOLUS (SEPSIS)
1000.0000 mL | Freq: Once | INTRAVENOUS | Status: AC
  Administered 2012-12-10: 1000 mL via INTRAVENOUS

## 2012-12-10 MED ORDER — ONDANSETRON HCL 4 MG/2ML IJ SOLN
4.0000 mg | Freq: Once | INTRAMUSCULAR | Status: AC
  Administered 2012-12-10: 4 mg via INTRAVENOUS
  Filled 2012-12-10: qty 2

## 2012-12-10 MED ORDER — POTASSIUM CHLORIDE CRYS ER 20 MEQ PO TBCR
EXTENDED_RELEASE_TABLET | ORAL | Status: AC
  Filled 2012-12-10: qty 2

## 2012-12-10 NOTE — ED Notes (Signed)
Patient given discharge instruction, verbalized understand. IV removed, band aid applied. Patient ambulatory out of the department.  

## 2012-12-10 NOTE — ED Notes (Signed)
CBG 325, pt states she is feeling better and ready for discharge.

## 2012-12-10 NOTE — ED Provider Notes (Signed)
CSN: [id number]     Arrival date & time 12/10/12  1757 History  This chart was scribed for [name], MD by Zettie Pho, ED Scribe. This patient was seen in room APA17/APA17 and the patient's care was started at 6:23 PM.    Chief Complaint  Patient presents with  . Hyperglycemia   The history is provided by the patient. No language interpreter was used.   HPI Comments: Tracy [name] is a 27 y.o. Female with a history of DM who presents to the Emergency Department complaining of hyperglycemia, 237 measured at home and she took some insulin, but states that her blood sugar continued to increase. She reports that she ate around noon, or 6.5 hours ago. She reports that she was burning some leaves and began to see "more leaves" and then she "could not see" and that her friend told her that her pupils appeared dilated. Patient reports a history of detached retina that was surgically repaired. She reports that her vision appears normal now. Her mother reports that her current symptoms are not normal for her and expresses concern for infection. Patient reports one episode of emesis and headache. Patient also reports a history of HSV 1 and 2, HTN, and hypothyroidism. Patient reports that she was recently treated for a yeast infection and UTI.   Past Medical History  Diagnosis Date  . Thyroid enlargement     "not on medication at this time"  . Urinary tract infection     hx of  . Anemia     presently on iron supplement  . Diabetes mellitus     insulin pump   . Hypothyroidism   . Anxiety   . HSV-2 (herpes simplex virus 2) infection   . HSV-1 (herpes simplex virus 1) infection   . Detached retina   . Yeast infection     took diflucan saturday    Past Surgical History  Procedure Laterality Date  . Cholecystectomy    . Pilonidal cyst excision    . Wisdom tooth extraction    . Pars plana vitrectomy  10/01/2011    Procedure: PARS PLANA VITRECTOMY WITH 25 GAUGE;  Surgeon: Hayden Pedro, MD;   Location: [place];  Service: Ophthalmology;  Laterality: Right;  Repair Complex Traction Retinal Detachment   Family History  Problem Relation Age of Onset  . Hypertension Other   . Stroke Other   . Diabetes Other   . Cancer Other   . Coronary artery disease Other    History  Substance Use Topics  . Smoking status: Never Smoker   . Smokeless tobacco: Never Used  . Alcohol Use: No   OB History   Grav Para Term Preterm Abortions TAB SAB Ect Mult Living   2    2  2         Review of Systems  A complete 10 system review of systems was obtained and all systems are negative except as noted in the HPI and PMH.   Allergies  Penicillins and Sulfa antibiotics  Home Medications   Current Outpatient Rx  Name  Route  Sig  Dispense  Refill  . acyclovir (ZOVIRAX) 400 MG tablet   Oral   Take 1 tablet (400 mg total) by mouth 2 (two) times daily.   60 tablet   11   . fluconazole (DIFLUCAN) 150 MG tablet      TAKE 1 NOW AND 1 IN 3 DAYS IF NEEDED   2 tablet   1   .  Insulin Lispro, Human, (HUMALOG KWIKPEN )   Subcutaneous   Inject into the skin. 3 times a day (just before each meal) 11-15-08 units, and pen needles 4/day         . LANTUS SOLOSTAR 100 UNIT/ML SOPN      25 Units at bedtime.          Marland Kitchen levothyroxine (SYNTHROID, LEVOTHROID) 25 MCG tablet   Oral   Take 25 mcg by mouth daily before breakfast.         . medroxyPROGESTERone (DEPO-PROVERA) 150 MG/ML injection               . NOVOFINE 30G X 8 MM MISC                BP 175/109  Pulse 99  Temp(Src) 98.3 F (36.8 C) (Oral)  Ht 5\' 3"  (1.6 m)  Wt 147 lb (66.679 kg)  BMI 26.05 kg/m2  SpO2 100%  Physical Exam  Nursing note and vitals reviewed. Constitutional: She is oriented to person, place, and time. She appears well-developed and well-nourished.  HENT:  Head: Normocephalic and atraumatic.  Eyes: Conjunctivae and EOM are normal. Pupils are equal, round, and reactive to light.  Neck: Normal range of  motion. Neck supple.  Cardiovascular: Normal rate, regular rhythm and normal heart sounds.   Pulmonary/Chest: Effort normal and breath sounds normal.  Abdominal: Soft. Bowel sounds are normal.  Musculoskeletal: Normal range of motion.  Neurological: She is alert and oriented to person, place, and time.  Skin: Skin is warm and dry.  Psychiatric: She has a normal mood and affect.    ED Course  Procedures (including critical care time)  DIAGNOSTIC STUDIES: Oxygen Saturation is 100% on room air, normal by my interpretation.    COORDINATION OF CARE: 6:27 PM- Will order UA, blood labs, and IV fluids. Discussed treatment plan with patient at bedside and patient verbalized agreement.   10:09 PM- Discussed normal UA results and that her blood sugar has decreased to a safe level. Patient reports feeling better. Patient appears better and has been urinating. Discussed treatment plan with patient at bedside and patient verbalized agreement.    Labs Review Labs Reviewed  GLUCOSE, CAPILLARY - Abnormal; Notable for the following:    Glucose-Capillary >600 (*)    All other components within normal limits  BASIC METABOLIC PANEL - Abnormal; Notable for the following:    Sodium 125 (*)    Chloride 83 (*)    Glucose, Bld 583 (*)    BUN 41 (*)    Creatinine, Ser 2.12 (*)    Calcium 10.6 (*)    GFR calc non Af Amer 31 (*)    GFR calc Af Amer 35 (*)    All other components within normal limits  CBC WITH DIFFERENTIAL - Abnormal; Notable for the following:    RBC 3.72 (*)    Hemoglobin 11.0 (*)    HCT 30.7 (*)    All other components within normal limits  URINALYSIS, ROUTINE W REFLEX MICROSCOPIC - Abnormal; Notable for the following:    Glucose, UA >1000 (*)    Hgb urine dipstick MODERATE (*)    Ketones, ur TRACE (*)    Protein, ur 100 (*)    All other components within normal limits  URINE MICROSCOPIC-ADD ON - Abnormal; Notable for the following:    Casts HYALINE CASTS (*)    All other  components within normal limits   Imaging Review No results found.  EKG Interpretation  None       MDM  No diagnosis found. Patient is not in DKA. Hyperglycemia has improved with 2 L of fluid.   Urinalysis shows 0-2 white cells.  Mildly elevated creatinine noted. I suspect this is secondary to her initial dehydration.  Patient feels much better at discharge. Color is good. This was discussed with the patient, her mother, father and husband  I personally performed the services described in this documentation, which was scribed in my presence. The recorded information has been reviewed and is accurate.   [name], MD 12/10/12 2242

## 2012-12-10 NOTE — ED Notes (Signed)
Patient ambulatory to restroom with steady gait.

## 2012-12-10 NOTE — ED Notes (Signed)
Blood sugar reads high on the meter.  B/p at home 198/100. Having problems with her vision.

## 2012-12-10 NOTE — ED Notes (Signed)
CRITICAL VALUE ALERT  Critical value received: glucose 583 Date of notification:  12/10/12  Time of notification: 1950  Critical value read back: yes Nurse who received alert:  Kayleen Memos, RN  MD notified: Dr. Lacinda Axon  Time: 1950

## 2012-12-10 NOTE — ED Notes (Signed)
Also states she vomited one time

## 2012-12-10 NOTE — ED Notes (Signed)
Patient ambulatory to restroom with steady gait, clean catch instructions given and advised pt to bring specimen back to room as well.  

## 2012-12-10 NOTE — ED Notes (Signed)
Pt awake talking, family at the bedside, CBG 307, fluids infusing with out difficulty

## 2012-12-21 ENCOUNTER — Ambulatory Visit (INDEPENDENT_AMBULATORY_CARE_PROVIDER_SITE_OTHER): Payer: BC Managed Care – PPO | Admitting: Ophthalmology

## 2013-01-01 ENCOUNTER — Other Ambulatory Visit (HOSPITAL_COMMUNITY): Payer: Self-pay | Admitting: Family Medicine

## 2013-01-05 ENCOUNTER — Ambulatory Visit: Payer: BC Managed Care – PPO

## 2013-01-05 ENCOUNTER — Ambulatory Visit (HOSPITAL_COMMUNITY)
Admission: RE | Admit: 2013-01-05 | Discharge: 2013-01-05 | Disposition: A | Discharge status: Home / Self Care | Payer: BC Managed Care – PPO | Source: Ambulatory Visit | Attending: Family Medicine | Admitting: Family Medicine

## 2013-01-05 DIAGNOSIS — R112 Nausea with vomiting, unspecified: Secondary | ICD-10-CM | POA: Insufficient documentation

## 2013-01-05 DIAGNOSIS — R51 Headache: Secondary | ICD-10-CM | POA: Insufficient documentation

## 2013-01-06 ENCOUNTER — Ambulatory Visit (INDEPENDENT_AMBULATORY_CARE_PROVIDER_SITE_OTHER): Payer: BC Managed Care – PPO | Admitting: Obstetrics & Gynecology

## 2013-01-06 ENCOUNTER — Encounter: Payer: Self-pay | Admitting: Obstetrics & Gynecology

## 2013-01-06 ENCOUNTER — Ambulatory Visit (HOSPITAL_COMMUNITY): Payer: BC Managed Care – PPO

## 2013-01-06 ENCOUNTER — Encounter (INDEPENDENT_AMBULATORY_CARE_PROVIDER_SITE_OTHER): Payer: Self-pay

## 2013-01-06 VITALS — BP 130/88 | Ht 63.0 in | Wt 141.0 lb

## 2013-01-06 DIAGNOSIS — Z3202 Encounter for pregnancy test, result negative: Secondary | ICD-10-CM

## 2013-01-06 DIAGNOSIS — Z3049 Encounter for surveillance of other contraceptives: Secondary | ICD-10-CM

## 2013-01-06 MED ORDER — MEDROXYPROGESTERONE ACETATE 150 MG/ML IM SUSP
150.0000 mg | Freq: Once | INTRAMUSCULAR | Status: AC
  Administered 2013-01-06: 150 mg via INTRAMUSCULAR

## 2013-01-06 NOTE — Progress Notes (Signed)
Pt here for Depo shot. No complaints at this time.

## 2013-01-13 ENCOUNTER — Ambulatory Visit (INDEPENDENT_AMBULATORY_CARE_PROVIDER_SITE_OTHER): Payer: BC Managed Care – PPO | Admitting: Ophthalmology

## 2013-01-18 ENCOUNTER — Ambulatory Visit (INDEPENDENT_AMBULATORY_CARE_PROVIDER_SITE_OTHER): Payer: BC Managed Care – PPO | Admitting: Diagnostic Neuroimaging

## 2013-01-18 ENCOUNTER — Inpatient Hospital Stay (HOSPITAL_COMMUNITY)
Admission: EM | Admit: 2013-01-18 | Discharge: 2013-01-22 | DRG: 637 | Disposition: A | Discharge status: Home / Self Care | Payer: BC Managed Care – PPO | Attending: Family Medicine | Admitting: Family Medicine

## 2013-01-18 ENCOUNTER — Telehealth: Payer: Self-pay | Admitting: Diagnostic Neuroimaging

## 2013-01-18 ENCOUNTER — Encounter (HOSPITAL_COMMUNITY): Payer: Self-pay | Admitting: Emergency Medicine

## 2013-01-18 ENCOUNTER — Ambulatory Visit (INDEPENDENT_AMBULATORY_CARE_PROVIDER_SITE_OTHER): Payer: BC Managed Care – PPO | Admitting: Neurology

## 2013-01-18 ENCOUNTER — Encounter: Payer: Self-pay | Admitting: Diagnostic Neuroimaging

## 2013-01-18 ENCOUNTER — Emergency Department (HOSPITAL_COMMUNITY): Payer: BC Managed Care – PPO

## 2013-01-18 VITALS — BP 188/103 | HR 103 | Temp 98.7°F | Ht 63.0 in | Wt 145.0 lb

## 2013-01-18 DIAGNOSIS — E876 Hypokalemia: Secondary | ICD-10-CM | POA: Diagnosis present

## 2013-01-18 DIAGNOSIS — Z833 Family history of diabetes mellitus: Secondary | ICD-10-CM

## 2013-01-18 DIAGNOSIS — E039 Hypothyroidism, unspecified: Secondary | ICD-10-CM

## 2013-01-18 DIAGNOSIS — E1110 Type 2 diabetes mellitus with ketoacidosis without coma: Secondary | ICD-10-CM

## 2013-01-18 DIAGNOSIS — N184 Chronic kidney disease, stage 4 (severe): Secondary | ICD-10-CM | POA: Diagnosis present

## 2013-01-18 DIAGNOSIS — Z794 Long term (current) use of insulin: Secondary | ICD-10-CM

## 2013-01-18 DIAGNOSIS — D72829 Elevated white blood cell count, unspecified: Secondary | ICD-10-CM

## 2013-01-18 DIAGNOSIS — G929 Unspecified toxic encephalopathy: Secondary | ICD-10-CM | POA: Diagnosis present

## 2013-01-18 DIAGNOSIS — G43101 Migraine with aura, not intractable, with status migrainosus: Secondary | ICD-10-CM

## 2013-01-18 DIAGNOSIS — G43909 Migraine, unspecified, not intractable, without status migrainosus: Secondary | ICD-10-CM | POA: Diagnosis present

## 2013-01-18 DIAGNOSIS — E860 Dehydration: Secondary | ICD-10-CM | POA: Diagnosis present

## 2013-01-18 DIAGNOSIS — G43111 Migraine with aura, intractable, with status migrainosus: Secondary | ICD-10-CM

## 2013-01-18 DIAGNOSIS — Z8249 Family history of ischemic heart disease and other diseases of the circulatory system: Secondary | ICD-10-CM

## 2013-01-18 DIAGNOSIS — N189 Chronic kidney disease, unspecified: Secondary | ICD-10-CM

## 2013-01-18 DIAGNOSIS — E1039 Type 1 diabetes mellitus with other diabetic ophthalmic complication: Secondary | ICD-10-CM

## 2013-01-18 DIAGNOSIS — A419 Sepsis, unspecified organism: Secondary | ICD-10-CM

## 2013-01-18 DIAGNOSIS — E1010 Type 1 diabetes mellitus with ketoacidosis without coma: Principal | ICD-10-CM | POA: Diagnosis present

## 2013-01-18 DIAGNOSIS — G92 Toxic encephalopathy: Secondary | ICD-10-CM | POA: Diagnosis present

## 2013-01-18 DIAGNOSIS — Z823 Family history of stroke: Secondary | ICD-10-CM

## 2013-01-18 DIAGNOSIS — Z8619 Personal history of other infectious and parasitic diseases: Secondary | ICD-10-CM

## 2013-01-18 DIAGNOSIS — N179 Acute kidney failure, unspecified: Secondary | ICD-10-CM

## 2013-01-18 DIAGNOSIS — E875 Hyperkalemia: Secondary | ICD-10-CM

## 2013-01-18 DIAGNOSIS — Z87440 Personal history of urinary (tract) infections: Secondary | ICD-10-CM

## 2013-01-18 DIAGNOSIS — G9340 Encephalopathy, unspecified: Secondary | ICD-10-CM

## 2013-01-18 DIAGNOSIS — F411 Generalized anxiety disorder: Secondary | ICD-10-CM | POA: Diagnosis present

## 2013-01-18 DIAGNOSIS — Z23 Encounter for immunization: Secondary | ICD-10-CM

## 2013-01-18 DIAGNOSIS — D638 Anemia in other chronic diseases classified elsewhere: Secondary | ICD-10-CM | POA: Diagnosis present

## 2013-01-18 DIAGNOSIS — R6510 Systemic inflammatory response syndrome (SIRS) of non-infectious origin without acute organ dysfunction: Secondary | ICD-10-CM | POA: Diagnosis present

## 2013-01-18 DIAGNOSIS — E11319 Type 2 diabetes mellitus with unspecified diabetic retinopathy without macular edema: Secondary | ICD-10-CM | POA: Diagnosis present

## 2013-01-18 DIAGNOSIS — D649 Anemia, unspecified: Secondary | ICD-10-CM | POA: Diagnosis present

## 2013-01-18 DIAGNOSIS — N186 End stage renal disease: Secondary | ICD-10-CM | POA: Diagnosis present

## 2013-01-18 LAB — URINALYSIS, ROUTINE W REFLEX MICROSCOPIC
Bilirubin Urine: NEGATIVE
Glucose, UA: >1000 mg/dL — AB
Ketones, ur: 40 mg/dL — AB
Leukocytes, UA: NEGATIVE
Nitrite: NEGATIVE
Protein, ur: 100 mg/dL — AB
Specific Gravity, Urine: 1.02 (ref 1.005–1.030)
Urobilinogen, UA: 0.2 mg/dL (ref 0.0–1.0)
pH: 5.5 (ref 5.0–8.0)

## 2013-01-18 LAB — CBC WITH DIFFERENTIAL/PLATELET
Basophils Relative: 1 % (ref 0–1)
Eosinophils Relative: 0 % (ref 0–5)
Hemoglobin: 9.7 g/dL — ABNORMAL LOW (ref 12.0–15.0)
Lymphs Abs: 2 10*3/uL (ref 0.7–4.0)
MCH: 29.5 pg (ref 26.0–34.0)
MCV: 99.7 fL (ref 78.0–100.0)
Monocytes Absolute: 0.9 10*3/uL (ref 0.1–1.0)
Monocytes Relative: 3 % (ref 3–12)
Neutro Abs: 25.9 10*3/uL — ABNORMAL HIGH (ref 1.7–7.7)
Platelets: 361 10*3/uL (ref 150–400)
RBC: 3.29 MIL/uL — ABNORMAL LOW (ref 3.87–5.11)
WBC: 29.1 10*3/uL — ABNORMAL HIGH (ref 4.0–10.5)

## 2013-01-18 LAB — RAPID URINE DRUG SCREEN, HOSP PERFORMED
Amphetamines: NOT DETECTED
Barbiturates: NOT DETECTED
Benzodiazepines: NOT DETECTED
Cocaine: NOT DETECTED
Opiates: NOT DETECTED
Tetrahydrocannabinol: NOT DETECTED

## 2013-01-18 LAB — CSF CELL COUNT WITH DIFFERENTIAL
RBC Count, CSF: 0 /mm3
RBC Count, CSF: 14500 /mm3 — ABNORMAL HIGH
Tube #: 1
Tube #: 3
WBC, CSF: 0 /mm3 (ref 0–5)

## 2013-01-18 LAB — URINE MICROSCOPIC-ADD ON

## 2013-01-18 LAB — BLOOD GAS, VENOUS
Bicarbonate: 3.7 mEq/L — ABNORMAL LOW (ref 20.0–24.0)
FIO2: 21 %
Patient temperature: 37
pH, Ven: 7.042 — CL (ref 7.250–7.300)

## 2013-01-18 LAB — GLUCOSE, CAPILLARY
Glucose-Capillary: >600 mg/dL (ref 70–99)
Glucose-Capillary: >600 mg/dL (ref 70–99)
Glucose-Capillary: >600 mg/dL (ref 70–99)

## 2013-01-18 LAB — GLUCOSE, CSF: Glucose, CSF: 477 mg/dL — ABNORMAL HIGH (ref 43–76)

## 2013-01-18 LAB — SALICYLATE LEVEL: Salicylate Lvl: <2 mg/dL — ABNORMAL LOW (ref 2.8–20.0)

## 2013-01-18 LAB — AMMONIA: Ammonia: 19 umol/L (ref 11–60)

## 2013-01-18 LAB — PREGNANCY, URINE: Preg Test, Ur: NEGATIVE

## 2013-01-18 LAB — CK: Total CK: 47 U/L (ref 7–177)

## 2013-01-18 LAB — ETHANOL: Alcohol, Ethyl (B): <11 mg/dL (ref 0–11)

## 2013-01-18 LAB — PROTEIN, CSF: Total  Protein, CSF: 24 mg/dL (ref 15–45)

## 2013-01-18 MED ORDER — BIOTENE DRY MOUTH MT LIQD
15.0000 mL | Freq: Two times a day (BID) | OROMUCOSAL | Status: DC
  Administered 2013-01-18 – 2013-01-22 (×8): 15 mL via OROMUCOSAL

## 2013-01-18 MED ORDER — PROMETHAZINE HCL 25 MG/ML IJ SOLN
25.0000 mg | Freq: Once | INTRAMUSCULAR | Status: AC
  Administered 2013-01-18: 25 mg via INTRAVENOUS

## 2013-01-18 MED ORDER — VALPROATE SODIUM 500 MG/5ML IV SOLN
1000.0000 mg | INTRAVENOUS | Status: DC
  Administered 2013-01-18: 1000 mg via INTRAVENOUS

## 2013-01-18 MED ORDER — SODIUM CHLORIDE 0.9 % IV SOLN
INTRAVENOUS | Status: DC
  Administered 2013-01-19: 20.7 [IU]/h via INTRAVENOUS
  Administered 2013-01-19: 02:00:00 via INTRAVENOUS

## 2013-01-18 MED ORDER — VANCOMYCIN HCL IN DEXTROSE 1-5 GM/200ML-% IV SOLN
1000.0000 mg | INTRAVENOUS | Status: DC
  Administered 2013-01-19 – 2013-01-21 (×3): 1000 mg via INTRAVENOUS
  Filled 2013-01-18 (×6): qty 200

## 2013-01-18 MED ORDER — SODIUM CHLORIDE 0.9 % IV BOLUS (SEPSIS)
1000.0000 mL | Freq: Once | INTRAVENOUS | Status: AC
  Administered 2013-01-18: 1000 mL via INTRAVENOUS

## 2013-01-18 MED ORDER — SODIUM CHLORIDE 0.9 % IJ SOLN
3.0000 mL | Freq: Two times a day (BID) | INTRAMUSCULAR | Status: DC
Start: 2013-01-18 — End: 2013-01-22
  Administered 2013-01-18 – 2013-01-21 (×4): 3 mL via INTRAVENOUS

## 2013-01-18 MED ORDER — TOPIRAMATE 50 MG PO TABS: 50.0000 mg | ORAL_TABLET | Freq: Two times a day (BID) | ORAL | Status: DC

## 2013-01-18 MED ORDER — SODIUM CHLORIDE 0.9 % IV SOLN
250.0000 mL | INTRAVENOUS | Status: DC
  Administered 2013-01-18: 250 mL via INTRAVENOUS

## 2013-01-18 MED ORDER — SODIUM CHLORIDE 0.9 % IV SOLN
INTRAVENOUS | Status: DC
  Administered 2013-01-18: 5.4 [IU]/h via INTRAVENOUS
  Filled 2013-01-18: qty 1

## 2013-01-18 MED ORDER — ACETAMINOPHEN 325 MG PO TABS
650.0000 mg | ORAL_TABLET | Freq: Four times a day (QID) | ORAL | Status: DC | PRN
  Administered 2013-01-20 (×2): 650 mg via ORAL
  Filled 2013-01-18 (×2): qty 2

## 2013-01-18 MED ORDER — SODIUM CHLORIDE 0.9 % IV SOLN: INTRAVENOUS | Status: DC

## 2013-01-18 MED ORDER — POVIDONE-IODINE 10 % EX SOLN
CUTANEOUS | Status: AC
  Administered 2013-01-18: 21:00:00
  Filled 2013-01-18: qty 118

## 2013-01-18 MED ORDER — ONDANSETRON HCL 4 MG PO TABS: 4.0000 mg | ORAL_TABLET | Freq: Four times a day (QID) | ORAL | Status: DC | PRN

## 2013-01-18 MED ORDER — ONDANSETRON HCL 4 MG/2ML IJ SOLN: 4.0000 mg | Freq: Four times a day (QID) | INTRAMUSCULAR | Status: DC | PRN

## 2013-01-18 MED ORDER — ACETAMINOPHEN 650 MG RE SUPP
650.0000 mg | Freq: Four times a day (QID) | RECTAL | Status: DC | PRN
  Administered 2013-01-19: 650 mg via RECTAL
  Filled 2013-01-18: qty 1

## 2013-01-18 MED ORDER — ACYCLOVIR SODIUM 50 MG/ML IV SOLN
650.0000 mg | Freq: Two times a day (BID) | INTRAVENOUS | Status: DC
  Administered 2013-01-18: 650 mg via INTRAVENOUS
  Filled 2013-01-18 (×2): qty 13

## 2013-01-18 MED ORDER — HEPARIN SODIUM (PORCINE) 5000 UNIT/ML IJ SOLN
5000.0000 [IU] | Freq: Three times a day (TID) | INTRAMUSCULAR | Status: DC
Start: 2013-01-18 — End: 2013-01-22
  Administered 2013-01-18 – 2013-01-22 (×9): 5000 [IU] via SUBCUTANEOUS
  Filled 2013-01-18 (×9): qty 1

## 2013-01-18 MED ORDER — DEXTROSE 50 % IV SOLN: 25.0000 mL | INTRAVENOUS | Status: DC | PRN

## 2013-01-18 MED ORDER — LEVOTHYROXINE SODIUM 25 MCG PO TABS
25.0000 ug | ORAL_TABLET | Freq: Every day | ORAL | Status: DC
  Administered 2013-01-19 – 2013-01-22 (×4): 25 ug via ORAL
  Filled 2013-01-18 (×4): qty 1

## 2013-01-18 MED ORDER — DEXTROSE-NACL 5-0.45 % IV SOLN
INTRAVENOUS | Status: DC
  Administered 2013-01-19: 06:00:00 via INTRAVENOUS

## 2013-01-18 MED ORDER — VANCOMYCIN HCL IN DEXTROSE 1-5 GM/200ML-% IV SOLN
1000.0000 mg | Freq: Once | INTRAVENOUS | Status: AC
  Administered 2013-01-18: 1000 mg via INTRAVENOUS
  Filled 2013-01-18: qty 200

## 2013-01-18 MED ORDER — ZOLMITRIPTAN 5 MG NA SOLN: 1.0000 | NASAL | Status: DC | PRN

## 2013-01-18 MED ORDER — ACYCLOVIR SODIUM 50 MG/ML IV SOLN
INTRAVENOUS | Status: AC
  Filled 2013-01-18: qty 20

## 2013-01-18 NOTE — Progress Notes (Signed)
[facility]  PATIENT: Tracy [name] DOB: 1985/02/26  REFERRING CLINICIAN: Doristine Johns HISTORY FROM: patient (mother and daughter with patient) REASON FOR VISIT: new consult   HISTORICAL  CHIEF COMPLAINT:  Chief Complaint  Patient presents with  . Headache    HISTORY OF PRESENT ILLNESS:   27 year old female with diabetes (since age 41 years old, previously on isulin pump), HSV 1, HSV 2, hypothyroidism, anemia, anxiety, here for evaluation of severe headaches.  In October 2014 patient developed new-onset global headaches, sometimes left-sided, with blurred vision, nausea and vomiting. Headaches have been waxing and waning. Some days are very severe and patient is unable to go work. She prefers to lay a dark quiet room. Patient had MRI of the brain on 01/05/2013 which was unremarkable. Patient was prescribed Vicodin, Flexeril, Zofran without relief.  Last night patient had severe onset/exacerbation of headache which has continued until today. Presently patient is in a wheelchair, vomiting, in severe distress.  REVIEW OF SYSTEMS: Full 14 system review of systems performed and notable only for chills fatigue blurred vision chest pain.  ALLERGIES: Allergies  Allergen Reactions  . Penicillins Hives  . Sulfa Antibiotics Hives    HOME MEDICATIONS: Outpatient Prescriptions Prior to Visit  Medication Sig Dispense Refill  . Insulin Lispro, Human, (HUMALOG KWIKPEN [place]) Inject 5-10 Units into the skin 3 (three) times daily before meals. 3 times a day (just before each meal) 11-15-08 units, and pen needles 4/day      . LANTUS SOLOSTAR 100 UNIT/ML SOPN Inject 25 Units into the skin at bedtime.       Marland Kitchen levothyroxine (SYNTHROID, LEVOTHROID) 25 MCG tablet Take 25 mcg by mouth daily before breakfast.      . medroxyPROGESTERone (DEPO-PROVERA) 150 MG/ML injection Inject 150 mg into the muscle every 3 (three) months.        No facility-administered medications prior to  visit.    PAST MEDICAL HISTORY: Past Medical History  Diagnosis Date  . Thyroid enlargement     "not on medication at this time"  . Urinary tract infection     hx of  . Anemia     presently on iron supplement  . Diabetes mellitus     insulin pump   . Hypothyroidism   . Anxiety   . HSV-2 (herpes simplex virus 2) infection   . HSV-1 (herpes simplex virus 1) infection   . Detached retina   . Yeast infection     took diflucan saturday     PAST SURGICAL HISTORY: Past Surgical History  Procedure Laterality Date  . Cholecystectomy    . Pilonidal cyst excision    . Wisdom tooth extraction    . Pars plana vitrectomy  10/01/2011    Procedure: PARS PLANA VITRECTOMY WITH 25 GAUGE;  Surgeon: Hayden Pedro, MD;  Location: [place];  Service: Ophthalmology;  Laterality: Right;  Repair Complex Traction Retinal Detachment    FAMILY HISTORY: Family History  Problem Relation Age of Onset  . Hypertension Other   . Stroke Other   . Diabetes Other   . Cancer Other   . Coronary artery disease Other     SOCIAL HISTORY:  History   Social History  . Marital Status: Married    Spouse Name: Audelia Acton    Number of Children: 0  . Years of Education: College   Occupational History  .  [employer]   Social History Main Topics  . Smoking status: Never Smoker   . Smokeless tobacco: Never  Used  . Alcohol Use: No  . Drug Use: No  . Sexual Activity: Yes    Birth Control/ Protection: Injection   Other Topics Concern  . Not on file   Social History Narrative   Patient lives at home with family.   Caffeine Use: 1 soda daily     PHYSICAL EXAM  Filed Vitals:   01/18/13 0925  BP: 188/103  Pulse: 103  Temp: 98.7 F (37.1 C)  TempSrc: Oral  Height: 5\' 3"  (1.6 m)  Weight: 145 lb (65.772 kg)    Not recorded    Body mass index is 25.69 kg/(m^2).  GENERAL EXAM: Patient is ILL APPEARING, WITH TRASH CAN, BURPING, NAUSEATED. CAN BARELY KEEP HER EYES OPEN.   CARDIOVASCULAR: Regular rate  and rhythm, no murmurs, no carotid bruits  NEUROLOGIC: MENTAL STATUS: awake, alert, oriented to person, place and time, memory intact, normal attention and concentration, language fluent, comprehension intact, naming intact, fund of knowledge appropriate CRANIAL NERVE: no papilledema on fundoscopic exam, MILD PHOTOPHOBIA, pupils equal and reactive to light, visual fields full to confrontation, extraocular muscles intact, no nystagmus, facial sensation and strength symmetric, hearing intact, palate elevates symmetrically, uvula midline, shoulder shrug symmetric, tongue midline. MOTOR: normal bulk and tone, 4+/5 STRENGTH THROUGHOUT in the BUE, BLE SENSORY: normal and symmetric to light touch, temperature, vibration COORDINATION: finger-nose-finger, fine finger movements normal REFLEXES: deep tendon reflexes present and symmetric GAIT/STATION: IN WHEELCHAIR, CANNOT GET UP ON HER OWN.    DIAGNOSTIC DATA (LABS, IMAGING, TESTING) - I reviewed patient records, labs, notes, testing and imaging myself where available.  Lab Results  Component Value Date   WBC 8.7 12/10/2012   HGB 11.0* 12/10/2012   HCT 30.7* 12/10/2012   MCV 82.5 12/10/2012   PLT 248 12/10/2012      Component Value Date/Time   NA 125* 12/10/2012 1835   K 3.7 12/10/2012 1835   CL 83* 12/10/2012 1835   CO2 22 12/10/2012 1835   GLUCOSE 583* 12/10/2012 1835   BUN 41* 12/10/2012 1835   CREATININE 2.12* 12/10/2012 1835   CALCIUM 10.6* 12/10/2012 1835   PROT 7.3 11/22/2008 1150   ALBUMIN 3.4* 11/22/2008 1150   AST 23 11/22/2008 1150   ALT 23 11/22/2008 1150   ALKPHOS 102 11/22/2008 1150   BILITOT 0.8 11/22/2008 1150   GFRNONAA 31* 12/10/2012 1835   GFRAA 35* 12/10/2012 1835   No results found for this basename: CHOL, HDL, LDLCALC, LDLDIRECT, TRIG, CHOLHDL   Lab Results  Component Value Date   HGBA1C 13.3* 10/01/2011   No results found for this basename: VITAMINB12   No results found for this basename: TSH     01/05/13 MRI BRAIN - normal (I reviewed images myself and agree with interpretation).   ASSESSMENT AND PLAN  27 y.o. year old female here with severe headaches since Oct 2014, much worsened last night, with severe pain, nausea and vomiting. Also, diabetes not under good control, with kidney dysfunction, neuropathy and retinopathy.  Dx: Migraine with aura and with status migrainosus, not intractable  PLAN: - IV depacon, IV phenergan, IV fluids - check CBC, CMP - rx for topiramate + zomig nasal spray  Orders Placed This Encounter  Procedures  . CBC With differential/Platelet  . Comprehensive metabolic panel   Return in about 2 weeks (around 02/01/2013) for with Charlott Holler or Kaydon Creedon.  I reviewed images, labs, notes, records myself. I summarized findings and reviewed with patient, for this high risk condition (migraine with severe exacerbation) requiring high  complexity decision making. Patient received IV medications and IV fluids with minimal relief. Patient observed in office for additional time. She was discharged from office with prescriptions for additional medication. Will have close followup in the next 2 weeks.   Penni Bombard, MD [id number], [id number] AM Certified in Neurology, Neurophysiology and Neuroimaging  [facility] [address] [place], [place] [address] [phone]

## 2013-01-18 NOTE — H&P (Addendum)
[name] History and Physical  [name] [id number] DOB: Sep 29, 1985 DOA: 01/18/2013   PCP: [name] with [name] Specialists: Followed by [name], endocrinology. She was seen by [name] with neurology this morning.  Chief Complaint: Confusion since this afternoon  HPI: Tracy [name] is a 27 y.o. female with a past medical history of type 1 diabetes, hypothyroidism. She has been evaluated recently for headaches, which were thought to be migrainous. She saw a neurologist earlier today and was given intravenous depacon and IV fluids. She was given prescriptions for nasal Zomig and oral Topamax. She took Topamax earlier today. She had a few episodes of emesis at the neurologist office. She came home and she was doing well, but then she started having confusion, hallucinations. Sugar was checked and it was very high. Subsequently, they brought her into the emergency department. Patient is lethargic but arousable and confused and so is unable to provide any history. Her mother was at the bedside.  Home Medications: Prior to Admission medications   Medication Sig Start Date End Date Taking? Authorizing Provider  cyclobenzaprine (FLEXERIL) 10 MG tablet Take 1 tablet by mouth at bedtime as needed and may repeat dose one time if needed. 01/01/13  Yes Historical Provider, MD  ibuprofen (ADVIL,MOTRIN) 200 MG tablet Take 200 mg by mouth every 6 (six) hours as needed.   Yes Historical Provider, MD  insulin lispro (HUMALOG KWIKPEN) 100 UNIT/ML SOPN Inject into the skin 3 (three) times daily with meals.   Yes Historical Provider, MD  Insulin Lispro, Human, (HUMALOG KWIKPEN ) Inject 5-10 Units into the skin 3 (three) times daily before meals. 3 times a day (just before each meal) 11-15-08 units, and pen needles 4/day   Yes Historical Provider, MD  LANTUS SOLOSTAR 100 UNIT/ML SOPN Inject 25 Units into the skin at bedtime.  06/01/12  Yes Historical Provider, MD   medroxyPROGESTERone (DEPO-PROVERA) 150 MG/ML injection Inject 150 mg into the muscle every 3 (three) months.  04/22/12  Yes Historical Provider, MD  ondansetron (ZOFRAN-ODT) 8 MG disintegrating tablet Take 8 mg by mouth at bedtime as needed for nausea.  01/01/13  Yes Historical Provider, MD  levothyroxine (SYNTHROID, LEVOTHROID) 25 MCG tablet Take 25 mcg by mouth daily before breakfast.    Historical Provider, MD    Allergies:  Allergies  Allergen Reactions  . Penicillins Hives and Swelling  . Sulfa Antibiotics Hives    Past Medical History: Past Medical History  Diagnosis Date  . Thyroid enlargement     "not on medication at this time"  . Urinary tract infection     hx of  . Anemia     presently on iron supplement  . Diabetes mellitus     insulin pump   . Hypothyroidism   . Anxiety   . HSV-2 (herpes simplex virus 2) infection   . HSV-1 (herpes simplex virus 1) infection   . Detached retina   . Yeast infection     took diflucan saturday     Past Surgical History  Procedure Laterality Date  . Cholecystectomy    . Pilonidal cyst excision    . Wisdom tooth extraction    . Pars plana vitrectomy  10/01/2011    Procedure: PARS PLANA VITRECTOMY WITH 25 GAUGE;  Surgeon: Hayden Pedro, MD;  Location: [facility];  Service: Ophthalmology;  Laterality: Right;  Repair Complex Traction Retinal Detachment    Social History: She lives with her husband. Usually independent with daily activities.  No smoking use. No alcohol use. No history of illicit drug abuse.  Family History:  Family History  Problem Relation Age of Onset  . Hypertension Other   . Stroke Other   . Diabetes Other   . Cancer Other   . Coronary artery disease Other      Review of Systems - unable to do due to altered mental status  Physical Examination  Filed Vitals:   01/18/13 1900 01/18/13 2004 01/18/13 2123 01/18/13 2200  BP: 124/64 107/56 108/89   Pulse:  127 133 138  Temp:      TempSrc:      Resp: 23 34  26 23  SpO2: 100% 97% 100% 100%    General appearance: Opens her eyes to voice command. Doesn't really follow instructions. Head: Normocephalic, without obvious abnormality, atraumatic Eyes: conjunctivae/corneas clear. PERRL, EOM's intact. Throat: very dry mucus membranes Neck: no adenopathy, no carotid bruit, no JVD, supple, symmetrical, trachea midline and thyroid not enlarged, symmetric, no tenderness/mass/nodules Resp: clear to auscultation bilaterally Cardio: S1-S2 is tachycardic. Regular. No S3, S4. No rubs, murmurs, or bruits. No pedal edema. GI: soft, non-tender; bowel sounds normal; no masses,  no organomegaly Extremities: extremities normal, atraumatic, no cyanosis or edema Pulses: 2+ and symmetric Skin: Skin color, texture, turgor normal. No rashes or lesions Lymph nodes: Cervical, supraclavicular, and axillary nodes normal. Neurologic: Arousable, but confused. Doesn't answer questions. Moving all extremities.  Laboratory Data: Results for orders placed during the hospital encounter of 01/18/13 (from the past 48 hour(s))  GLUCOSE, CAPILLARY     Status: Abnormal   Collection Time    01/18/13  6:14 PM      Result Value Range   Glucose-Capillary >600 (*) 70 - 99 mg/dL  CBC WITH DIFFERENTIAL     Status: Abnormal   Collection Time    01/18/13  6:30 PM      Result Value Range   WBC 29.1 (*) 4.0 - 10.5 K/uL   RBC 3.29 (*) 3.87 - 5.11 MIL/uL   Hemoglobin 9.7 (*) 12.0 - 15.0 g/dL   HCT 32.8 (*) 36.0 - 46.0 %   MCV 99.7  78.0 - 100.0 fL   MCH 29.5  26.0 - 34.0 pg   MCHC 29.6 (*) 30.0 - 36.0 g/dL   RDW 13.4  11.5 - 15.5 %   Platelets 361  150 - 400 K/uL   Neutrophils Relative % 89 (*) 43 - 77 %   Lymphocytes Relative 7 (*) 12 - 46 %   Monocytes Relative 3  3 - 12 %   Eosinophils Relative 0  0 - 5 %   Basophils Relative 1  0 - 1 %   Neutro Abs 25.9 (*) 1.7 - 7.7 K/uL   Lymphs Abs 2.0  0.7 - 4.0 K/uL   Monocytes Absolute 0.9  0.1 - 1.0 K/uL   Eosinophils Absolute 0.0  0.0  - 0.7 K/uL   Basophils Absolute 0.3 (*) 0.0 - 0.1 K/uL   WBC Morphology WHITE COUNT CONFIRMED ON SMEAR     Comment: MILD LEFT SHIFT (1-5% METAS, OCC MYELO, OCC BANDS)  BASIC METABOLIC PANEL     Status: Abnormal (Preliminary result)   Collection Time    01/18/13  6:30 PM      Result Value Range   Sodium 130 (*) 135 - 145 mEq/L   Potassium 6.2 (*) 3.5 - 5.1 mEq/L   Chloride 82 (*) 96 - 112 mEq/L   CO2 <7 (*) 19 - 32 mEq/L  Comment: CRITICAL RESULT CALLED TO, READ BACK BY AND VERIFIED WITH:     BELTON,C ON 01/18/13 AT 1935 BY LOY,C   Glucose, Bld 1216 (*) 70 - 99 mg/dL   Comment: CRITICAL RESULT CALLED TO, READ BACK BY AND VERIFIED WITH:     BELTON,C ON 01/18/13 AT 1935 BY LOY,C   BUN 45 (*) 6 - 23 mg/dL   Creatinine, Ser 2.81 (*) 0.50 - 1.10 mg/dL   Calcium 9.4  8.4 - 10.5 mg/dL   GFR calc non Af Amer 22 (*) >90 mL/min   GFR calc Af Amer 25 (*) >90 mL/min   Comment: (NOTE)     The eGFR has been calculated using the CKD EPI equation.     This calculation has not been validated in all clinical situations.     eGFR's persistently <90 mL/min signify possible Chronic Kidney     Disease.  CK     Status: None   Collection Time    01/18/13  6:30 PM      Result Value Range   Total CK 47  7 - 177 U/L  ETHANOL     Status: None   Collection Time    01/18/13  6:30 PM      Result Value Range   Alcohol, Ethyl (B) <11  0 - 11 mg/dL   Comment:            LOWEST DETECTABLE LIMIT FOR     SERUM ALCOHOL IS 11 mg/dL     FOR MEDICAL PURPOSES ONLY  ACETAMINOPHEN LEVEL     Status: None   Collection Time    01/18/13  6:30 PM      Result Value Range   Acetaminophen (Tylenol), Serum <15.0  10 - 30 ug/mL   Comment:            THERAPEUTIC CONCENTRATIONS VARY     SIGNIFICANTLY. A RANGE OF 10-30     ug/mL MAY BE AN EFFECTIVE     CONCENTRATION FOR MANY PATIENTS.     HOWEVER, SOME ARE BEST TREATED     AT CONCENTRATIONS OUTSIDE THIS     RANGE.     ACETAMINOPHEN CONCENTRATIONS     >150 ug/mL AT 4  HOURS AFTER     INGESTION AND >50 ug/mL AT 12     HOURS AFTER INGESTION ARE     OFTEN ASSOCIATED WITH TOXIC     REACTIONS.  SALICYLATE LEVEL     Status: Abnormal   Collection Time    01/18/13  6:30 PM      Result Value Range   Salicylate Lvl 123456 (*) 2.8 - 20.0 mg/dL  URINALYSIS, ROUTINE W REFLEX MICROSCOPIC     Status: Abnormal   Collection Time    01/18/13  6:57 PM      Result Value Range   Color, Urine YELLOW  YELLOW   APPearance HAZY (*) CLEAR   Specific Gravity, Urine 1.020  1.005 - 1.030   pH 5.5  5.0 - 8.0   Glucose, UA >1000 (*) NEGATIVE mg/dL   Hgb urine dipstick SMALL (*) NEGATIVE   Bilirubin Urine NEGATIVE  NEGATIVE   Ketones, ur 40 (*) NEGATIVE mg/dL   Protein, ur 100 (*) NEGATIVE mg/dL   Urobilinogen, UA 0.2  0.0 - 1.0 mg/dL   Nitrite NEGATIVE  NEGATIVE   Leukocytes, UA NEGATIVE  NEGATIVE  URINE RAPID DRUG SCREEN (HOSP PERFORMED)     Status: None   Collection Time    01/18/13  6:57 PM      Result Value Range   Opiates NONE DETECTED  NONE DETECTED   Cocaine NONE DETECTED  NONE DETECTED   Benzodiazepines NONE DETECTED  NONE DETECTED   Amphetamines NONE DETECTED  NONE DETECTED   Tetrahydrocannabinol NONE DETECTED  NONE DETECTED   Barbiturates NONE DETECTED  NONE DETECTED   Comment:            DRUG SCREEN FOR MEDICAL PURPOSES     ONLY.  IF CONFIRMATION IS NEEDED     FOR ANY PURPOSE, NOTIFY LAB     WITHIN 5 DAYS.                LOWEST DETECTABLE LIMITS     FOR URINE DRUG SCREEN     Drug Class       Cutoff (ng/mL)     Amphetamine      1000     Barbiturate      200     Benzodiazepine   [id number]     Tricyclics       [id number]     Opiates          300     Cocaine          300     THC              50  URINE MICROSCOPIC-ADD ON     Status: Abnormal   Collection Time    01/18/13  6:57 PM      Result Value Range   Squamous Epithelial / LPF RARE  RARE   WBC, UA 0-2  <3 WBC/hpf   RBC / HPF 0-2  <3 RBC/hpf   Bacteria, UA FEW (*) RARE   Urine-Other AMORPHOUS  URATES/PHOSPHATES    BLOOD GAS, VENOUS     Status: Abnormal   Collection Time    01/18/13  7:10 PM      Result Value Range   FIO2 21.00     Delivery systems ROOM AIR     pH, Ven 7.042 (*) 7.250 - 7.300   Comment: CRITICAL RESULT CALLED TO, READ BACK BY AND VERIFIED WITH:     JJ CREWS RN BY K KNICK RRT RCP ON 01/18/2013 AT 1918   pCO2, Ven 14.2 (*) 45.0 - 50.0 mmHg   pO2, Ven 60.7 (*) 30.0 - 45.0 mmHg   Bicarbonate 3.7 (*) 20.0 - 24.0 mEq/L   TCO2 3.8  0 - 100 mmol/L   Acid-Base Excess 25.1 (*) 0.0 - 2.0 mmol/L   O2 Saturation 79.6     Patient temperature 37.0     Collection site RIGHT RADIAL     Drawn by [place]     Sample type VENOUS    PREGNANCY, URINE     Status: None   Collection Time    01/18/13  7:10 PM      Result Value Range   Preg Test, Ur NEGATIVE  NEGATIVE   Comment:            THE SENSITIVITY OF THIS     METHODOLOGY IS >20 mIU/mL.  AMMONIA     Status: None   Collection Time    01/18/13  7:12 PM      Result Value Range   Ammonia 19  11 - 60 umol/L  GLUCOSE, CAPILLARY     Status: Abnormal   Collection Time    01/18/13  8:25 PM      Result Value Range   Glucose-Capillary >  600 (*) 70 - 99 mg/dL  GLUCOSE, CAPILLARY     Status: Abnormal   Collection Time    01/18/13  9:39 PM      Result Value Range   Glucose-Capillary >600 (*) 70 - 99 mg/dL    Radiology Reports: Ct Head Wo Contrast  01/18/2013   CLINICAL DATA:  Hyperglycemia.  Confusion.  Diabetes.  EXAM: CT HEAD WITHOUT CONTRAST  TECHNIQUE: Contiguous axial images were obtained from the base of the skull through the vertex without intravenous contrast.  COMPARISON:  MRI 01/05/2013.  FINDINGS: No mass lesion, mass effect, midline shift, hydrocephalus, hemorrhage. No territorial ischemia or acute infarction. Paranasal sinuses appear within normal limits.  IMPRESSION: Negative CT brain.   Electronically Signed   By: Dereck Ligas M.D.   On: 01/18/2013 20:21   Dg Chest Portable 1 View  01/18/2013    CLINICAL DATA:  Hyperglycemia, diabetes, altered mental status  EXAM: PORTABLE CHEST - 1 VIEW  COMPARISON:  10/01/2011  FINDINGS: The heart size and mediastinal contours are within normal limits. Both lungs are clear. The visualized skeletal structures are unremarkable.  IMPRESSION: No active disease.   Electronically Signed   By: Daryll Brod M.D.   On: 01/18/2013 18:54    Electrocardiogram: EKG shows sinus tachycardia at 145 beats per minute. Normal axis. Nonspecific ST changes probably related to weight. No concerning ischemic changes are noted.  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 She has diabetic ketoacidosis, which is quite severe. She has acute on chronic renal failure with hyperkalemia. She was noted to have leukocytosis and low-grade fever. She is profoundly dehydrated. Her encephalopathy appears to be multifactorial and related to ketoacidosis, recent medications and possible infection. Plan: #1 severe or diabetic ketoacidosis: She'll be placed on insulin infusion. She'll be given IV fluids. Labs will be checked frequently. She'll be monitored in the intensive care unit. HbA1c will be checked. She is maintaining her airway at this time.   #2 acute encephalopathy in the setting of leukocytosis and low-grade fever: She's been given vancomycin and blood cultures have been obtained. LP has been done and CSF analysis is pending at this time. She does have a history of herpes. So, we will give her acyclovir as well. Topamax will be discontinued. Neurology will be consulted to see her in the morning.    #3 low-grade fever with leukocytosis: No clear source of infection. CSF analysis is pending. Check lactic acid. See discussion above.  #3 acute on chronic renal failure with hyperkalemia: She will be given IV fluids. Strict ins and outs. Foley catheter will need to be placed. Monitor renal function closely.  #4 normocytic anemia: No overt bleeding. Continue to trend hemoglobin. Check anemia panel in the morning.  #5 history of type 2 diabetes: She used to be on an insulin pump till last year. Her endocrinologist discontinued it due to poor control. She remains on Lantus insulin at this time. However, based on his notes from September it appears that her diabetes is very poorly controlled. Her HbA1c in August was 13.3.   DVT Prophylaxis: Heparin Code Status: Full code Family Communication: Discussed  with her mother in detail  Disposition Plan: Admit to ICU   Further management decisions will depend on results of further testing and patient's response to treatment.  Critical care time spent at bedside and in direct care: 45 mins  [facility] Pager [phone]  If 7PM-7AM, please contact night-coverage [email or web address] Password [facility]  01/18/2013, 10:41 PM

## 2013-01-18 NOTE — Patient Instructions (Addendum)
Patient was to return home and rest.

## 2013-01-18 NOTE — Progress Notes (Signed)
Patient here seeing [name].  Order for Depacon 1000mg  IV, Phenergan 25mg  IV push, 568mls IV sodium chloride.  Patient to treatment room, in wheelchair, with mother and niece.  Patient vomiting and pale, headache pain level 10.   IV started in left AC, 24g angiocath.  Sodium Chloride, 163mls started at 1015 and Phenergan 25mg  diluted with 5cc NS pushed over 4 minutes.  Upon completion Depacon 1000mg /100ccNS started at 1025.  Patient continued to vomit.  Headache level about an 8, patient did not respond to questions.  When Depacon completed 240ml of Sodium chloride was started and then followed by an additional 226mls of NS.  Patient continued to vomit when fluids were stopped. When completed IV was stopped and discontinued.  Patient was wheeled to front of building in wheelchair and had mother as her driver.

## 2013-01-18 NOTE — ED Notes (Signed)
CRITICAL VALUE ALERT  Critical value received:  CO2 <7, glucose 1216  Date of notification:  01/18/13  Time of notification:  1932  Critical value read back:yes  Nurse who received alert:  Joellyn Rued, RN  MD notified (1st page):  Dr. Christy Gentles  Time of first page:  1932  MD notified (2nd page):  Time of second page:  Responding MD:  Dr. Christy Gentles  Time MD responded:  [phone]

## 2013-01-18 NOTE — Progress Notes (Addendum)
ANTIBIOTIC CONSULT NOTE - INITIAL  Pharmacy Consult for Vancomycin & Acyclovir Indication: rule out meningitis  Allergies  Allergen Reactions  . Penicillins Hives and Swelling  . Sulfa Antibiotics Hives    Patient Measurements:   Last recorded body weight: 65.8kg on 01/18/13  Vital Signs: Temp: 100.7 F (38.2 C) (12/08 1859) Temp src: Rectal (12/08 1859) BP: 108/89 mmHg (12/08 2123) Pulse Rate: 138 (12/08 2200) Intake/Output from previous day:   Intake/Output from this shift:    Labs:  Recent Labs  01/18/13 1830  WBC 29.1*  HGB 9.7*  PLT 361  CREATININE 2.81*   The CrCl is unknown because both a height and weight (above a minimum accepted value) are required for this calculation. No results found for this basename: VANCOTROUGH, VANCOPEAK, VANCORANDOM, GENTTROUGH, GENTPEAK, GENTRANDOM, TOBRATROUGH, TOBRAPEAK, TOBRARND, AMIKACINPEAK, AMIKACINTROU, AMIKACIN,  in the last 72 hours   Microbiology: No results found for this or any previous visit (from the past 720 hour(s)).  Medical History: Past Medical History  Diagnosis Date  . Thyroid enlargement     "not on medication at this time"  . Urinary tract infection     hx of  . Anemia     presently on iron supplement  . Diabetes mellitus     insulin pump   . Hypothyroidism   . Anxiety   . HSV-2 (herpes simplex virus 2) infection   . HSV-1 (herpes simplex virus 1) infection   . Detached retina   . Yeast infection     took diflucan saturday     Medications:  Scheduled:  . heparin  5,000 Units Subcutaneous Q8H  . [START ON 01/19/2013] levothyroxine  25 mcg Oral QAC breakfast  . sodium chloride  3 mL Intravenous Q12H   Assessment: 27 yo F who presents with migraines, AMS, DKA, and febrile illness.  WBC is elevated.   ARF noted which is likely due to dehydration.  Estimated CrCl ~ 30-35 ml/min.    Vancomycin 12/8>> Acyclovir 12/8>>  Goal of Therapy:  Vancomycin trough level 15-20 mcg/ml  Plan:   Acyclovir 10mg /kg q12h Vancomycin 1gm IV q24h Check Vancomycin trough at steady state Monitor renal function and cx data.   Biagio Borg 01/18/2013,10:44 PM  Addendum for dosing Azactam for septicemia in PCN allergic patient with CrCl 20-31ml/min.  Adjustment for renal function is to use 50% of dose for serious infection.  Will plan first dose 2gm then start 1gm IV q8h.

## 2013-01-18 NOTE — ED Notes (Signed)
Dr. Maryland Pink in room assessing patient.

## 2013-01-18 NOTE — ED Provider Notes (Signed)
CSN: [id number]     Arrival date & time 01/18/13  1804 History   First MD Initiated Contact with Patient 01/18/13 [place]     Chief Complaint  Patient presents with  . Hyperglycemia   Patient is a 27 y.o. female presenting with hyperglycemia. The history is provided by a relative, the spouse and a parent. The history is limited by the condition of the patient.  Hyperglycemia Severity:  Severe Onset quality:  Gradual Timing:  Constant Progression:  Worsening Chronicity:  Recurrent Diabetes status:  Controlled with insulin Relieved by:  Nothing Associated symptoms: altered mental status   Associated symptoms: no fever   Risk factors: hx of DKA   Risk factors comment:  Distant h/o DKA pt presents from home for elevated glucose and altered mental status Pt has h/o diabetes, controlled by insulin (does not have insulin pump) She has distant h/o DKA Per family,pt seen by neurology today for recurrent headaches (had recent negative MR brain) While in the office she was treated with medications for headache She was discharged and went home. She took initial dose of topamax Soon after family reports she became altered/confused No trauma No fever Her AMS started about 2-3 hours ago   Per records from neurology clinic, pt given depacon/phenergen and IV fluids today   Past Medical History  Diagnosis Date  . Thyroid enlargement     "not on medication at this time"  . Urinary tract infection     hx of  . Anemia     presently on iron supplement  . Diabetes mellitus     insulin pump   . Hypothyroidism   . Anxiety   . HSV-2 (herpes simplex virus 2) infection   . HSV-1 (herpes simplex virus 1) infection   . Detached retina   . Yeast infection     took diflucan saturday    Past Surgical History  Procedure Laterality Date  . Cholecystectomy    . Pilonidal cyst excision    . Wisdom tooth extraction    . Pars plana vitrectomy  10/01/2011    Procedure: PARS PLANA VITRECTOMY WITH 25  GAUGE;  Surgeon: Hayden Pedro, MD;  Location: [facility];  Service: Ophthalmology;  Laterality: Right;  Repair Complex Traction Retinal Detachment   Family History  Problem Relation Age of Onset  . Hypertension Other   . Stroke Other   . Diabetes Other   . Cancer Other   . Coronary artery disease Other    History  Substance Use Topics  . Smoking status: Never Smoker   . Smokeless tobacco: Never Used  . Alcohol Use: No   OB History   Grav Para Term Preterm Abortions TAB SAB Ect Mult Living   2    2  2         Review of Systems  Unable to perform ROS: Mental status change  Constitutional: Negative for fever.    Allergies  Penicillins and Sulfa antibiotics  Home Medications   Current Outpatient Rx  Name  Route  Sig  Dispense  Refill  . cyclobenzaprine (FLEXERIL) 10 MG tablet   Oral   Take 1 tablet by mouth at bedtime as needed and may repeat dose one time if needed.         . Insulin Lispro, Human, (HUMALOG KWIKPEN [place])   Subcutaneous   Inject 5-10 Units into the skin 3 (three) times daily before meals. 3 times a day (just before each meal) 11-15-08 units, and pen needles 4/day         .  Lancets (ONETOUCH ULTRASOFT) lancets               . LANTUS SOLOSTAR 100 UNIT/ML SOPN   Subcutaneous   Inject 25 Units into the skin at bedtime.          Marland Kitchen levothyroxine (SYNTHROID, LEVOTHROID) 25 MCG tablet   Oral   Take 25 mcg by mouth daily before breakfast.         . medroxyPROGESTERone (DEPO-PROVERA) 150 MG/ML injection   Intramuscular   Inject 150 mg into the muscle every 3 (three) months.          . NOVOFINE 30G X 8 MM MISC               . ondansetron (ZOFRAN-ODT) 8 MG disintegrating tablet   Oral   Take 1 tablet by mouth daily.         . ONE TOUCH ULTRA TEST test strip               . topiramate (TOPAMAX) 50 MG tablet   Oral   Take 1 tablet (50 mg total) by mouth 2 (two) times daily.   60 tablet   6   . zolmitriptan (ZOMIG) 5 MG nasal  solution   Nasal   Place 1 spray into the nose as needed for migraine (may repeat x 1 after 2 hours).   6 Units   6    BP 151/90  Pulse 143  Resp 30  SpO2 100% BP 151/90  Pulse 143  Temp(Src) 100.7 F (38.2 C) (Rectal)  Resp 30  SpO2 100%  Physical Exam CONSTITUTIONAL: ill appearing, somnolent, pt smells ketotic HEAD: Normocephalic/atraumatic EYES: PERRL ENMT: Mucous membranes dry NECK: supple no meningeal signs [id number] S1/S2 noted, no murmurs/rubs/gallops noted LUNGS: Lungs are clear to auscultation bilaterally, tachypnea noted ABDOMEN: soft, nontender, no rebound or guarding GU:no cva tenderness NEURO: Pt is somnolent but arousable.  Maex4.  She will wake up and speak incoherently then close eyes EXTREMITIES: pulses normal, full ROM SKIN: warm, color normal, no rash PSYCH: she is intermittently combative  ED Course  LUMBAR PUNCTURE Date/Time: 01/18/2013 9:26 PM Performed by: Sharyon Cable Authorized by: Sharyon Cable Consent: written consent obtained. Risks and benefits: risks, benefits and alternatives were discussed Consent given by: spouse Patient identity confirmed: provided demographic data Time out: Immediately prior to procedure a "time out" was called to verify the correct patient, procedure, equipment, support staff and site/side marked as required. Indications: evaluation for infection and evaluation for altered mental status Anesthesia: local infiltration Local anesthetic: lidocaine 1% without epinephrine Anesthetic total: 3 ml Patient sedated: no Preparation: Patient was prepped and draped in the usual sterile fashion. Lumbar space: L4-L5 interspace Patient's position: sitting Needle gauge: 22 Needle type: spinal needle - Quincke tip Number of attempts: 1 Fluid appearance: clear Tubes of fluid: 4 Total volume: 4 ml Post-procedure: site cleaned and adhesive bandage applied Patient tolerance: Patient tolerated the procedure well  with no immediate complications.   CRITICAL CARE Performed by: Sharyon Cable Total critical care time: 60 Critical care time was exclusive of separately billable procedures and treating other patients. Critical care was necessary to treat or prevent imminent or life-threatening deterioration. Critical care was time spent personally by me on the following activities: development of treatment plan with patient and/or surrogate as well as nursing, discussions with consultants, evaluation of patient's response to treatment, examination of patient, obtaining history from patient or surrogate, ordering and performing treatments and interventions, ordering and  review of laboratory studies, ordering and review of radiographic studies, pulse oximetry and re-evaluation of patient's condition.   7:03 PM Pt clearly in DKA.  However she is noted to have fever and her AMS is apparently abrupt in onset CXR clear Will check u/a Will follow closely.  If urine is not c/w UTI, may need LP Will follow closely 7:21 PM Cultures drawn Will start with vancomycin Will defer steroids for possible meningitis as already with severe hyperglycemia 8:56 PM D/w dr Bonnielee Haff Will admit Plan is to perform LP (no contraindications, CT head negative, not on anticoagulants) and beneift outweighs risk.  Already started on abx, will defer further therapy until after LP Husband agrees to sign consent for LP, discussion was witnessed by nurse 9:25 PM Pt tolerated LP well She moves all extremitiesx4 D/w dr [place] Pink, will admit to ICU CSF was clear She has already been started on antibiotics Dr [place] Pink will continue care and start further antibiotics  Labs Review Labs Reviewed  GLUCOSE, CAPILLARY - Abnormal; Notable for the following:    Glucose-Capillary >600 (*)    All other components within normal limits  URINE CULTURE  CBC WITH DIFFERENTIAL  BASIC METABOLIC PANEL  URINALYSIS, ROUTINE W REFLEX  MICROSCOPIC  CK  ETHANOL  AMMONIA  ACETAMINOPHEN LEVEL  SALICYLATE LEVEL  URINE RAPID DRUG SCREEN (HOSP PERFORMED)  BLOOD GAS, VENOUS   Imaging Review Dg Chest Portable 1 View  01/18/2013   CLINICAL DATA:  Hyperglycemia, diabetes, altered mental status  EXAM: PORTABLE CHEST - 1 VIEW  COMPARISON:  10/01/2011  FINDINGS: The heart size and mediastinal contours are within normal limits. Both lungs are clear. The visualized skeletal structures are unremarkable.  IMPRESSION: No active disease.   Electronically Signed   By: Daryll Brod M.D.   On: 01/18/2013 18:54    EKG Interpretation   None       MDM  No diagnosis found. Nursing notes including past medical history and social history reviewed and considered in documentation xrays reviewed and considered Labs/vital reviewed and considered Previous records reviewed and considered - recent neuro notes reviewed    Date: 01/18/2013  Rate: 145  Rhythm: sinus tachycardia  QRS Axis: right  Intervals: normal  ST/T Wave abnormalities: hyperacute T waves noted  Conduction Disutrbances:none     Sharyon Cable, MD 01/18/13 2127

## 2013-01-18 NOTE — Patient Instructions (Signed)
Start topiramate 50 mg at bedtime. After 2 weeks increase to twice a day.  Start Zomig nasal spray 5 mg into one nostril as needed for onset of severe migraine. May repeat one additional spray after 2 hours. Maximum 2 sprays in 24 hours. Maximum 8 sprays per month.  Continue ondansetron dissolving tablets as needed for nausea.

## 2013-01-18 NOTE — Telephone Encounter (Signed)
I was called by [facility] with stat/panic lab results for patient.  Glucose = 929 WBC = 20.0 BUN/Cr = 37/2.12 Na 133  I called patient'shusband and gave results. They are on the way to the hospital [facility]) right now.   Penni Bombard, MD [id number], [id number] PM Certified in Neurology, Neurophysiology and Neuroimaging  [facility] [address], [place], [place] [address] [phone]

## 2013-01-18 NOTE — Telephone Encounter (Signed)
Patient's mother called in to report the patient is "talking funny" as though she has had novocaine. Patient herself is complaining of feeling thirsty. Family is concerned that she is having some numbness or swelling in her tongue. Patient took first dose of topiramate this evening, after coming to the office for new consultation this morning. No report of rash or shortness of breath.  PLAN: 1. Family will check patient's blood sugar and then take her to the emergency room for further evaluation of possible allergic reaction to topiramate.  Penni Bombard, MD [id number], [id number] PM Certified in Neurology, Neurophysiology and Neuroimaging  [facility] [address] [place],  [address] [phone]

## 2013-01-18 NOTE — ED Notes (Addendum)
Patient seen by neurologist for migraine headache-blood drawn. Family reports patient has been vomiting. Family called by neurologist and told to come to ER due to blood sugar >900 and WBC 20.0. Per family patient has "been acting out of it" since 4pm.

## 2013-01-19 DIAGNOSIS — E039 Hypothyroidism, unspecified: Secondary | ICD-10-CM

## 2013-01-19 LAB — BASIC METABOLIC PANEL
BUN: 45 mg/dL — ABNORMAL HIGH (ref 6–23)
BUN: 44 mg/dL — ABNORMAL HIGH (ref 6–23)
BUN: 42 mg/dL — ABNORMAL HIGH (ref 6–23)
BUN: 40 mg/dL — ABNORMAL HIGH (ref 6–23)
CO2: <7 mEq/L — CL (ref 19–32)
CO2: 11 mEq/L — ABNORMAL LOW (ref 19–32)
CO2: 14 mEq/L — ABNORMAL LOW (ref 19–32)
CO2: 17 mEq/L — ABNORMAL LOW (ref 19–32)
CO2: 21 mEq/L (ref 19–32)
CO2: 21 mEq/L (ref 19–32)
Calcium: 9.4 mg/dL (ref 8.4–10.5)
Calcium: 8.5 mg/dL (ref 8.4–10.5)
Calcium: 8.7 mg/dL (ref 8.4–10.5)
Chloride: 92 mEq/L — ABNORMAL LOW (ref 96–112)
Chloride: 99 mEq/L (ref 96–112)
Chloride: 105 mEq/L (ref 96–112)
Chloride: 112 mEq/L (ref 96–112)
Chloride: 110 mEq/L (ref 96–112)
Chloride: 108 mEq/L (ref 96–112)
Creatinine, Ser: 2.81 mg/dL — ABNORMAL HIGH (ref 0.50–1.10)
Creatinine, Ser: 2.92 mg/dL — ABNORMAL HIGH (ref 0.50–1.10)
Creatinine, Ser: 2.9 mg/dL — ABNORMAL HIGH (ref 0.50–1.10)
Creatinine, Ser: 2.66 mg/dL — ABNORMAL HIGH (ref 0.50–1.10)
GFR calc Af Amer: 25 mL/min — ABNORMAL LOW (ref >90)
GFR calc Af Amer: 24 mL/min — ABNORMAL LOW (ref >90)
GFR calc Af Amer: 24 mL/min — ABNORMAL LOW (ref >90)
GFR calc Af Amer: 26 mL/min — ABNORMAL LOW (ref >90)
GFR calc non Af Amer: 21 mL/min — ABNORMAL LOW (ref >90)
GFR calc non Af Amer: 23 mL/min — ABNORMAL LOW (ref >90)
Glucose, Bld: 1216 mg/dL (ref 70–99)
Glucose, Bld: 1004 mg/dL (ref 70–99)
Glucose, Bld: 495 mg/dL — ABNORMAL HIGH (ref 70–99)
Glucose, Bld: 258 mg/dL — ABNORMAL HIGH (ref 70–99)
Glucose, Bld: 136 mg/dL — ABNORMAL HIGH (ref 70–99)
Potassium: 5.2 mEq/L — ABNORMAL HIGH (ref 3.5–5.1)
Potassium: 3.5 mEq/L (ref 3.5–5.1)
Potassium: 3.1 mEq/L — ABNORMAL LOW (ref 3.5–5.1)
Potassium: 2.9 mEq/L — ABNORMAL LOW (ref 3.5–5.1)
Potassium: 3.8 mEq/L (ref 3.5–5.1)
Potassium: 4 mEq/L (ref 3.5–5.1)
Sodium: 133 mEq/L — ABNORMAL LOW (ref 135–145)
Sodium: 142 mEq/L (ref 135–145)
Sodium: 146 mEq/L — ABNORMAL HIGH (ref 135–145)
Sodium: 143 mEq/L (ref 135–145)
Sodium: 139 mEq/L (ref 135–145)

## 2013-01-19 LAB — COMPREHENSIVE METABOLIC PANEL
AST: 31 IU/L (ref 0–40)
Albumin/Globulin Ratio: 1 — ABNORMAL LOW (ref 1.1–2.5)
Albumin: 3.2 g/dL — ABNORMAL LOW (ref 3.5–5.5)
Calcium: 9.5 mg/dL (ref 8.7–10.2)
Creatinine, Ser: 2.12 mg/dL — ABNORMAL HIGH (ref 0.57–1.00)
GFR calc Af Amer: 36 mL/min/{1.73_m2} — ABNORMAL LOW (ref >59)
GFR calc non Af Amer: 31 mL/min/{1.73_m2} — ABNORMAL LOW (ref >59)
Globulin, Total: 3.1 g/dL (ref 1.5–4.5)
Potassium: 4.8 mmol/L (ref 3.5–5.2)
Sodium: 133 mmol/L — ABNORMAL LOW (ref 134–144)
Total Bilirubin: 0.3 mg/dL (ref 0.0–1.2)

## 2013-01-19 LAB — GLUCOSE, CAPILLARY
Glucose-Capillary: 131 mg/dL — ABNORMAL HIGH (ref 70–99)
Glucose-Capillary: 166 mg/dL — ABNORMAL HIGH (ref 70–99)
Glucose-Capillary: 176 mg/dL — ABNORMAL HIGH (ref 70–99)
Glucose-Capillary: 214 mg/dL — ABNORMAL HIGH (ref 70–99)
Glucose-Capillary: 356 mg/dL — ABNORMAL HIGH (ref 70–99)
Glucose-Capillary: 544 mg/dL — ABNORMAL HIGH (ref 70–99)
Glucose-Capillary: 77 mg/dL (ref 70–99)
Glucose-Capillary: 90 mg/dL (ref 70–99)
Glucose-Capillary: >600 mg/dL (ref 70–99)

## 2013-01-19 LAB — CBC
HCT: 23.2 % — ABNORMAL LOW (ref 36.0–46.0)
Hemoglobin: 8.1 g/dL — ABNORMAL LOW (ref 12.0–15.0)
MCH: 29.8 pg (ref 26.0–34.0)
MCHC: 34.9 g/dL (ref 30.0–36.0)
MCV: 85.3 fL (ref 78.0–100.0)
Platelets: 219 10*3/uL (ref 150–400)
RBC: 2.72 MIL/uL — ABNORMAL LOW (ref 3.87–5.11)
RDW: 12.9 % (ref 11.5–15.5)
WBC: 18.1 10*3/uL — ABNORMAL HIGH (ref 4.0–10.5)

## 2013-01-19 LAB — CBC WITH DIFFERENTIAL
Basos: 0 %
Eos: 0 %
HCT: 34.2 % (ref 34.0–46.6)
Hemoglobin: 10.9 g/dL — ABNORMAL LOW (ref 11.1–15.9)
Lymphocytes Absolute: 1.4 10*3/uL (ref 0.7–3.1)
Lymphs: 7 %
MCH: 29.5 pg (ref 26.6–33.0)
MCV: 92 fL (ref 79–97)
Monocytes: 3 %
Neutrophils Absolute: 18 10*3/uL — ABNORMAL HIGH (ref 1.4–7.0)
Platelets: 264 10*3/uL (ref 150–379)
RBC: 3.7 x10E6/uL — ABNORMAL LOW (ref 3.77–5.28)
WBC: 20 10*3/uL (ref 3.4–10.8)

## 2013-01-19 LAB — IRON AND TIBC
Iron: <10 ug/dL — ABNORMAL LOW (ref 42–135)
UIBC: 204 ug/dL (ref 125–400)

## 2013-01-19 LAB — TSH: TSH: 5.382 u[IU]/mL — ABNORMAL HIGH (ref 0.350–4.500)

## 2013-01-19 LAB — RETICULOCYTES
RBC.: 2.72 MIL/uL — ABNORMAL LOW (ref 3.87–5.11)
Retic Ct Pct: 2.9 % (ref 0.4–3.1)

## 2013-01-19 LAB — FOLATE: Folate: 11.7 ng/mL

## 2013-01-19 LAB — VITAMIN B12: Vitamin B-12: 999 pg/mL — ABNORMAL HIGH (ref 211–911)

## 2013-01-19 LAB — HEMOGLOBIN A1C: Hgb A1c MFr Bld: 16.1 % — ABNORMAL HIGH (ref <5.7)

## 2013-01-19 MED ORDER — DIPHENHYDRAMINE HCL 50 MG/ML IJ SOLN
12.5000 mg | Freq: Once | INTRAMUSCULAR | Status: AC
  Administered 2013-01-19: 12.5 mg via INTRAVENOUS
  Filled 2013-01-19: qty 1

## 2013-01-19 MED ORDER — SUMATRIPTAN 20 MG/ACT NA SOLN: 20.0000 mg | NASAL | Status: DC | PRN

## 2013-01-19 MED ORDER — SODIUM CHLORIDE 0.9 % IV SOLN
INTRAVENOUS | Status: DC
  Administered 2013-01-19: 1.2 [IU]/h via INTRAVENOUS
  Filled 2013-01-19: qty 1

## 2013-01-19 MED ORDER — POTASSIUM CHLORIDE 10 MEQ/100ML IV SOLN
10.0000 meq | INTRAVENOUS | Status: AC
  Administered 2013-01-19 (×4): 10 meq via INTRAVENOUS
  Filled 2013-01-19 (×4): qty 100

## 2013-01-19 MED ORDER — INSULIN REGULAR BOLUS VIA INFUSION
0.0000 [IU] | Freq: Three times a day (TID) | INTRAVENOUS | Status: DC
  Filled 2013-01-19: qty 10

## 2013-01-19 MED ORDER — SUMATRIPTAN SUCCINATE 25 MG PO TABS
25.0000 mg | ORAL_TABLET | ORAL | Status: DC | PRN
  Filled 2013-01-19: qty 1

## 2013-01-19 MED ORDER — DEXTROSE 5 % IV SOLN
1.0000 g | Freq: Three times a day (TID) | INTRAVENOUS | Status: DC
  Administered 2013-01-19 – 2013-01-22 (×11): 1 g via INTRAVENOUS
  Filled 2013-01-19 (×19): qty 1

## 2013-01-19 MED ORDER — AZTREONAM 2 G IJ SOLR
2.0000 g | Freq: Once | INTRAMUSCULAR | Status: AC
  Administered 2013-01-19: 2 g via INTRAVENOUS
  Filled 2013-01-19: qty 2

## 2013-01-19 MED ORDER — INSULIN GLARGINE 100 UNIT/ML ~~LOC~~ SOLN
25.0000 [IU] | Freq: Every day | SUBCUTANEOUS | Status: DC
  Administered 2013-01-19: 25 [IU] via SUBCUTANEOUS
  Filled 2013-01-19 (×2): qty 0.25

## 2013-01-19 MED ORDER — DEXTROSE 5 % IV SOLN
INTRAVENOUS | Status: AC
  Filled 2013-01-19: qty 2

## 2013-01-19 MED ORDER — PHENOL 1.4 % MT LIQD
1.0000 | OROMUCOSAL | Status: DC | PRN
  Administered 2013-01-19: 1 via OROMUCOSAL
  Filled 2013-01-19 (×2): qty 177

## 2013-01-19 MED ORDER — INSULIN ASPART 100 UNIT/ML ~~LOC~~ SOLN
0.0000 [IU] | Freq: Three times a day (TID) | SUBCUTANEOUS | Status: DC
  Administered 2013-01-19: 2 [IU] via SUBCUTANEOUS
  Administered 2013-01-20: 3 [IU] via SUBCUTANEOUS

## 2013-01-19 MED ORDER — INSULIN ASPART 100 UNIT/ML ~~LOC~~ SOLN
3.0000 [IU] | Freq: Once | SUBCUTANEOUS | Status: AC
  Administered 2013-01-19: 3 [IU] via SUBCUTANEOUS

## 2013-01-19 NOTE — Consult Note (Signed)
[place] A. Merlene Laughter, MD     [email or web address]          Tracy [name] is an 27 y.o. female.   ASSESSMENT/PLAN:  1.  Episodic headaches likely migrainous in nature. The patient has been started on Topamax prophylactic treatment. She should be placed on 25 mg. The dose can be increased to 50 mg after a week to minimize the risk of side effects. She is on abortive treatment with Imitrex which should be appropriate. She also continues NSAID such as Motrin 800 mg.  2. Resolving altered mental status due to acute toxic metabolic illness including DKA, acute renal insufficiency and dehydration. CSF analysis does not support a diagnosis of meningitis or encephalitis. I agree with discontinuation of acyclovir and other antibiotics for treatment of meningitis.  The patient is a 27 year old white female who presents with episodic headaches for the last several weeks started in October. The history is obtained from the patient and her family members. The patient's headaches. She did be severe and especially bedtime. Headaches can either be unilateral or holocephalic. She reports that the headaches are associated with significant nausea. She does not report significant photophobia although there may be some photophobia. The patient did see one of the neurologist in [place] on the day of admission though the severity of her headaches. Appears that he headaches though episodic Become more frequent and hence the neurological consultation to [facility]. She was given an IV infusion 1000 mg of IV valproic acid and also IV Phenergan because of the headaches. The patient went home and developed significant cognitive impairment and confusion and hence was taken to the emergency room for further evaluation. She was found to have multiple metabolic derangements including a glucose of 1290, CO2 that was undetected, acute renal insufficiency with elevation of BUN and creatinine, and pH of 7.04  indicating acidemia. The patient was diagnosed with acute diabetic ketoacidosis. She is currently in the ICU receiving acute treatment. It appears that her headaches have improved and her confusion and altered mentation has also improved. She does not report having any headaches at this time due to the evaluation. She complains of a sore throat. Other complaints are negative and review of systems otherwise unremarkable. Sheis in the hospital but can provide limited history as to why she is in the hospital.   The patient's CSF analysis shows significant RBC in the thousands on tube, one which cleared to 0 in tube #3. WBC is about 0. Glucose is quite high but protein is normal. Gram stain shows no organism.  GENERAL: The patient is in the room with the lights out. She appears to be in moderate discomfort.  HEENT: Neck is supple head is normocephalic atraumatic.  ABDOMEN: soft  EXTREMITIES: No edema   BACK: Unremarkable.  SKIN: Normal by inspection.    MENTAL STATUS: Patient has her eyes closed and appears to be in some discomfort. She communicates in full sentences and is generally lucid and coherent although she is somewhat drowsy. He followed commands well.  CRANIAL NERVES: Pupils are equal, round and reactive to light and accommodation; extra ocular movements are full, there is no significant nystagmus; visual fields are full; upper and lower facial muscles are normal in strength and symmetric, there is no flattening of the nasolabial folds; tongue is midline; uvula is midline; shoulder elevation is normal. Funduscopic examination is attempted but somewhat limited. The disc appears flat although I could not clearly see spontaneous venous pulsations.  MOTOR: Normal tone,  bulk and strength; no pronator drift.  COORDINATION: Left finger to nose is normal, right finger to nose is normal, No rest tremor; no intention tremor; no postural tremor; no bradykinesia.  REFLEXES: Deep tendon reflexes are  symmetrical and normal. Plantar responses are flexor bilaterally.   SENSATION: Normal to light touch, temperature, and pinprick.    Past Medical History  Diagnosis Date  . Thyroid enlargement     "not on medication at this time"  . Urinary tract infection     hx of  . Anemia     presently on iron supplement  . Diabetes mellitus     insulin pump   . Hypothyroidism   . Anxiety   . HSV-2 (herpes simplex virus 2) infection   . HSV-1 (herpes simplex virus 1) infection   . Detached retina   . Yeast infection     took diflucan saturday     Past Surgical History  Procedure Laterality Date  . Cholecystectomy    . Pilonidal cyst excision    . Wisdom tooth extraction    . Pars plana vitrectomy  10/01/2011    Procedure: PARS PLANA VITRECTOMY WITH 25 GAUGE;  Surgeon: Hayden Pedro, MD;  Location: [facility];  Service: Ophthalmology;  Laterality: Right;  Repair Complex Traction Retinal Detachment    Family History  Problem Relation Age of Onset  . Hypertension Other   . Stroke Other   . Diabetes Other   . Cancer Other   . Coronary artery disease Other     Social History:  reports that she has never smoked. She has never used smokeless tobacco. She reports that she does not drink alcohol or use illicit drugs.  Allergies:  Allergies  Allergen Reactions  . Penicillins Hives and Swelling  . Sulfa Antibiotics Hives    Medications: Prior to Admission medications   Medication Sig Start Date End Date Taking? Authorizing Provider  cyclobenzaprine (FLEXERIL) 10 MG tablet Take 1 tablet by mouth at bedtime as needed and may repeat dose one time if needed. 01/01/13  Yes Historical Provider, MD  ibuprofen (ADVIL,MOTRIN) 200 MG tablet Take 200 mg by mouth every 6 (six) hours as needed.   Yes Historical Provider, MD  Insulin Lispro, Human, (HUMALOG KWIKPEN [employer]) Inject 5-10 Units into the skin 3 (three) times daily before meals. 3 times a day (just before each meal) 11-15-08 units, and pen  needles 4/day   Yes Historical Provider, MD  LANTUS SOLOSTAR 100 UNIT/ML SOPN Inject 25 Units into the skin at bedtime.  06/01/12  Yes Historical Provider, MD  medroxyPROGESTERone (DEPO-PROVERA) 150 MG/ML injection Inject 150 mg into the muscle every 3 (three) months.  04/22/12  Yes Historical Provider, MD  ondansetron (ZOFRAN-ODT) 8 MG disintegrating tablet Take 8 mg by mouth at bedtime as needed for nausea.  01/01/13  Yes Historical Provider, MD  topiramate (TOPAMAX) 50 MG tablet Take 50 mg by mouth 2 (two) times daily.   Yes Historical Provider, MD  zolmitriptan (ZOMIG) 5 MG nasal solution Place 1 spray into the nose as needed for migraine. (may repeat x1 after 2 hours)   Yes Historical Provider, MD  levothyroxine (SYNTHROID, LEVOTHROID) 25 MCG tablet Take 25 mcg by mouth daily before breakfast.    Historical Provider, MD     Scheduled Meds: . antiseptic oral rinse  15 mL Mouth Rinse BID  . aztreonam  1 g Intravenous Q8H  . heparin  5,000 Units Subcutaneous Q8H  . insulin aspart  0-9 Units Subcutaneous  TID WC  . insulin glargine  25 Units Subcutaneous Daily  . levothyroxine  25 mcg Oral QAC breakfast  . sodium chloride  3 mL Intravenous Q12H  . vancomycin  1,000 mg Intravenous Q24H   Continuous Infusions: . sodium chloride Stopped (01/19/13 0549)  . dextrose 5 % and 0.45% NaCl 125 mL/hr at 01/19/13 1400   PRN Meds:.acetaminophen, acetaminophen, dextrose, ondansetron (ZOFRAN) IV, ondansetron, phenol, SUMAtriptan    Blood pressure 147/91, pulse 102, temperature 98.9 F (37.2 C), temperature source Oral, resp. rate 16, height 5\' 3"  (1.6 m), weight 65.5 kg (144 lb 6.4 oz), SpO2 100.00%.   Results for orders placed during the hospital encounter of 01/18/13 (from the past 48 hour(s))  GLUCOSE, CAPILLARY     Status: Abnormal   Collection Time    01/18/13  6:14 PM      Result Value Range   Glucose-Capillary >600 (*) 70 - 99 mg/dL  CBC WITH DIFFERENTIAL     Status: Abnormal    Collection Time    01/18/13  6:30 PM      Result Value Range   WBC 29.1 (*) 4.0 - 10.5 K/uL   RBC 3.29 (*) 3.87 - 5.11 MIL/uL   Hemoglobin 9.7 (*) 12.0 - 15.0 g/dL   HCT 32.8 (*) 36.0 - 46.0 %   MCV 99.7  78.0 - 100.0 fL   MCH 29.5  26.0 - 34.0 pg   MCHC 29.6 (*) 30.0 - 36.0 g/dL   RDW 13.4  11.5 - 15.5 %   Platelets 361  150 - 400 K/uL   Neutrophils Relative % 89 (*) 43 - 77 %   Lymphocytes Relative 7 (*) 12 - 46 %   Monocytes Relative 3  3 - 12 %   Eosinophils Relative 0  0 - 5 %   Basophils Relative 1  0 - 1 %   Neutro Abs 25.9 (*) 1.7 - 7.7 K/uL   Lymphs Abs 2.0  0.7 - 4.0 K/uL   Monocytes Absolute 0.9  0.1 - 1.0 K/uL   Eosinophils Absolute 0.0  0.0 - 0.7 K/uL   Basophils Absolute 0.3 (*) 0.0 - 0.1 K/uL   WBC Morphology WHITE COUNT CONFIRMED ON SMEAR     Comment: MILD LEFT SHIFT (1-5% METAS, OCC MYELO, OCC BANDS)  BASIC METABOLIC PANEL     Status: Abnormal   Collection Time    01/18/13  6:30 PM      Result Value Range   Sodium 130 (*) 135 - 145 mEq/L   Potassium 6.2 (*) 3.5 - 5.1 mEq/L   Chloride 82 (*) 96 - 112 mEq/L   CO2 <7 (*) 19 - 32 mEq/L   Comment: CRITICAL RESULT CALLED TO, READ BACK BY AND VERIFIED WITH:     BELTON,C ON 01/18/13 AT 1935 BY LOY,C   Glucose, Bld 1216 (*) 70 - 99 mg/dL   Comment: CRITICAL RESULT CALLED TO, READ BACK BY AND VERIFIED WITH:     BELTON,C ON 01/18/13 AT 1935 BY LOY,C   BUN 45 (*) 6 - 23 mg/dL   Creatinine, Ser 2.81 (*) 0.50 - 1.10 mg/dL   Calcium 9.4  8.4 - 10.5 mg/dL   GFR calc non Af Amer 22 (*) >90 mL/min   GFR calc Af Amer 25 (*) >90 mL/min   Comment: (NOTE)     The eGFR has been calculated using the CKD EPI equation.     This calculation has not been validated in all clinical situations.  eGFR's persistently <90 mL/min signify possible Chronic Kidney     Disease.  CK     Status: None   Collection Time    01/18/13  6:30 PM      Result Value Range   Total CK 47  7 - 177 U/L  ETHANOL     Status: None   Collection Time     01/18/13  6:30 PM      Result Value Range   Alcohol, Ethyl (B) <11  0 - 11 mg/dL   Comment:            LOWEST DETECTABLE LIMIT FOR     SERUM ALCOHOL IS 11 mg/dL     FOR MEDICAL PURPOSES ONLY  ACETAMINOPHEN LEVEL     Status: None   Collection Time    01/18/13  6:30 PM      Result Value Range   Acetaminophen (Tylenol), Serum <15.0  10 - 30 ug/mL   Comment:            THERAPEUTIC CONCENTRATIONS VARY     SIGNIFICANTLY. A RANGE OF 10-30     ug/mL MAY BE AN EFFECTIVE     CONCENTRATION FOR MANY PATIENTS.     HOWEVER, SOME ARE BEST TREATED     AT CONCENTRATIONS OUTSIDE THIS     RANGE.     ACETAMINOPHEN CONCENTRATIONS     >150 ug/mL AT 4 HOURS AFTER     INGESTION AND >50 ug/mL AT 12     HOURS AFTER INGESTION ARE     OFTEN ASSOCIATED WITH TOXIC     REACTIONS.  SALICYLATE LEVEL     Status: Abnormal   Collection Time    01/18/13  6:30 PM      Result Value Range   Salicylate Lvl 123456 (*) 2.8 - 20.0 mg/dL  URINALYSIS, ROUTINE W REFLEX MICROSCOPIC     Status: Abnormal   Collection Time    01/18/13  6:57 PM      Result Value Range   Color, Urine YELLOW  YELLOW   APPearance HAZY (*) CLEAR   Specific Gravity, Urine 1.020  1.005 - 1.030   pH 5.5  5.0 - 8.0   Glucose, UA >1000 (*) NEGATIVE mg/dL   Hgb urine dipstick SMALL (*) NEGATIVE   Bilirubin Urine NEGATIVE  NEGATIVE   Ketones, ur 40 (*) NEGATIVE mg/dL   Protein, ur 100 (*) NEGATIVE mg/dL   Urobilinogen, UA 0.2  0.0 - 1.0 mg/dL   Nitrite NEGATIVE  NEGATIVE   Leukocytes, UA NEGATIVE  NEGATIVE  URINE RAPID DRUG SCREEN (HOSP PERFORMED)     Status: None   Collection Time    01/18/13  6:57 PM      Result Value Range   Opiates NONE DETECTED  NONE DETECTED   Cocaine NONE DETECTED  NONE DETECTED   Benzodiazepines NONE DETECTED  NONE DETECTED   Amphetamines NONE DETECTED  NONE DETECTED   Tetrahydrocannabinol NONE DETECTED  NONE DETECTED   Barbiturates NONE DETECTED  NONE DETECTED   Comment:            DRUG SCREEN FOR MEDICAL PURPOSES      ONLY.  IF CONFIRMATION IS NEEDED     FOR ANY PURPOSE, NOTIFY LAB     WITHIN 5 DAYS.                LOWEST DETECTABLE LIMITS     FOR URINE DRUG SCREEN     Drug Class  Cutoff (ng/mL)     Amphetamine      1000     Barbiturate      200     Benzodiazepine   [id number]     Tricyclics       [id number]     Opiates          300     Cocaine          300     THC              50  URINE MICROSCOPIC-ADD ON     Status: Abnormal   Collection Time    01/18/13  6:57 PM      Result Value Range   Squamous Epithelial / LPF RARE  RARE   WBC, UA 0-2  <3 WBC/hpf   RBC / HPF 0-2  <3 RBC/hpf   Bacteria, UA FEW (*) RARE   Urine-Other AMORPHOUS URATES/PHOSPHATES    BLOOD GAS, VENOUS     Status: Abnormal   Collection Time    01/18/13  7:10 PM      Result Value Range   FIO2 21.00     Delivery systems ROOM AIR     pH, Ven 7.042 (*) 7.250 - 7.300   Comment: CRITICAL RESULT CALLED TO, READ BACK BY AND VERIFIED WITH:     JJ CREWS RN BY K KNICK RRT RCP ON 01/18/2013 AT 1918   pCO2, Ven 14.2 (*) 45.0 - 50.0 mmHg   pO2, Ven 60.7 (*) 30.0 - 45.0 mmHg   Bicarbonate 3.7 (*) 20.0 - 24.0 mEq/L   TCO2 3.8  0 - 100 mmol/L   Acid-Base Excess 25.1 (*) 0.0 - 2.0 mmol/L   O2 Saturation 79.6     Patient temperature 37.0     Collection site RIGHT RADIAL     Drawn by [place]     Sample type VENOUS    PREGNANCY, URINE     Status: None   Collection Time    01/18/13  7:10 PM      Result Value Range   Preg Test, Ur NEGATIVE  NEGATIVE   Comment:            THE SENSITIVITY OF THIS     METHODOLOGY IS >20 mIU/mL.  AMMONIA     Status: None   Collection Time    01/18/13  7:12 PM      Result Value Range   Ammonia 19  11 - 60 umol/L  CULTURE, BLOOD (ROUTINE X 2)     Status: None   Collection Time    01/18/13  7:12 PM      Result Value Range   Specimen Description BLOOD LEFT ANTECUBITAL     Special Requests BOTTLES DRAWN AEROBIC AND ANAEROBIC 8CC     Culture NO GROWTH 1 DAY     Report Status PENDING      CULTURE, BLOOD (ROUTINE X 2)     Status: None   Collection Time    01/18/13  7:20 PM      Result Value Range   Specimen Description BRONCHIAL WASHINGS     Special Requests BOTTLES DRAWN AEROBIC ONLY 5CC     Culture NO GROWTH 1 DAY     Report Status PENDING    GLUCOSE, CAPILLARY     Status: Abnormal   Collection Time    01/18/13  8:25 PM      Result Value Range   Glucose-Capillary >600 (*) 70 - 99 mg/dL  CSF CELL COUNT WITH DIFFERENTIAL     Status: Abnormal   Collection Time    01/18/13  9:18 PM      Result Value Range   Tube # 1     Color, CSF CLEAR (*) COLORLESS   Appearance, CSF CLEAR  CLEAR   Supernatant NOT INDICATED     RBC Count, CSF 14500 (*) 0 /cu mm   WBC, CSF 0  0 - 5 /cu mm   Segmented Neutrophils-CSF TOO FEW TO COUNT, SMEAR AVAILABLE FOR REVIEW  0 - 6 %   Lymphs, CSF TOO FEW TO COUNT, SMEAR AVAILABLE FOR REVIEW  40 - 80 %   Monocyte-Macrophage-Spinal Fluid TOO FEW TO COUNT, SMEAR AVAILABLE FOR REVIEW  15 - 45 %   Eosinophils, CSF TOO FEW TO COUNT, SMEAR AVAILABLE FOR REVIEW  0 - 1 %   Other Cells, CSF TOO FEW TO COUNT, SMEAR AVAILABLE FOR REVIEW    CSF CELL COUNT WITH DIFFERENTIAL     Status: Abnormal   Collection Time    01/18/13  9:18 PM      Result Value Range   Tube # 3     Color, CSF CLEAR (*) COLORLESS   Appearance, CSF CLEAR  CLEAR   Supernatant NOT INDICATED     RBC Count, CSF 0  0 /cu mm   WBC, CSF 0  0 - 5 /cu mm   Segmented Neutrophils-CSF TOO FEW TO COUNT, SMEAR AVAILABLE FOR REVIEW  0 - 6 %   Lymphs, CSF TOO FEW TO COUNT, SMEAR AVAILABLE FOR REVIEW  40 - 80 %   Monocyte-Macrophage-Spinal Fluid TOO FEW TO COUNT, SMEAR AVAILABLE FOR REVIEW  15 - 45 %   Eosinophils, CSF TOO FEW TO COUNT, SMEAR AVAILABLE FOR REVIEW  0 - 1 %   Other Cells, CSF TOO FEW TO COUNT, SMEAR AVAILABLE FOR REVIEW    CSF CULTURE     Status: None   Collection Time    01/18/13  9:18 PM      Result Value Range   Specimen Description CSF     Special Requests NONE     Gram  Stain       Value: CYTOSPIN SLIDE NO WBC SEEN     NO ORGANISMS SEEN     Performed at [facility]     Performed at [facility] PENDING     Report Status PENDING    GLUCOSE, CSF     Status: Abnormal   Collection Time    01/18/13  9:18 PM      Result Value Range   Glucose, CSF 477 (*) 43 - 76 mg/dL  PROTEIN, CSF     Status: None   Collection Time    01/18/13  9:18 PM      Result Value Range   Total  Protein, CSF 24  15 - 45 mg/dL  GLUCOSE, CAPILLARY     Status: Abnormal   Collection Time    01/18/13  9:39 PM      Result Value Range   Glucose-Capillary >600 (*) 70 - 99 mg/dL  MRSA PCR SCREENING     Status: None   Collection Time    01/18/13 10:39 PM      Result Value Range   MRSA by PCR NEGATIVE  NEGATIVE   Comment:            The GeneXpert MRSA Assay (FDA     approved for NASAL specimens  only), is one component of a     comprehensive MRSA colonization     surveillance program. It is not     intended to diagnose MRSA     infection nor to guide or     monitor treatment for     MRSA infections.  GLUCOSE, CAPILLARY     Status: Abnormal   Collection Time    01/18/13 10:40 PM      Result Value Range   Glucose-Capillary >600 (*) 70 - 99 mg/dL   Comment 1 Notify RN    LACTIC ACID, PLASMA     Status: Abnormal   Collection Time    01/18/13 11:18 PM      Result Value Range   Lactic Acid, Venous 7.2 (*) 0.5 - 2.2 mmol/L  HEMOGLOBIN A1C     Status: Abnormal   Collection Time    01/18/13 11:18 PM      Result Value Range   Hemoglobin A1C 16.1 (*) <5.7 %   Comment: (NOTE)                                                                               According to the ADA Clinical Practice Recommendations for 2011, when     HbA1c is used as a screening test:      >=6.5%   Diagnostic of Diabetes Mellitus               (if abnormal result is confirmed)     5.7-6.4%   Increased risk of developing Diabetes Mellitus     References:Diagnosis and  Classification of Diabetes Mellitus,Diabetes     [id number] 1):S62-S69 and Standards of Medical Care in             Diabetes - 2011,Diabetes Care,2011,34 (Suppl 1):S11-S61.   Mean Plasma Glucose 415 (*) <117 mg/dL   Comment: Performed at [facility]     Status: Abnormal   Collection Time    01/18/13 11:18 PM      Result Value Range   Sodium 133 (*) 135 - 145 mEq/L   Potassium 5.2 (*) 3.5 - 5.1 mEq/L   Chloride 92 (*) 96 - 112 mEq/L   Comment: DELTA CHECK NOTED   CO2 <7 (*) 19 - 32 mEq/L   Comment: CRITICAL RESULT CALLED TO, READ BACK BY AND VERIFIED WITH:     WAGONER R AT 2356 ON [id number] BY FORSYTH K     DELTA CHECK NOTED   Glucose, Bld 1004 (*) 70 - 99 mg/dL   Comment: RESULT REPEATED AND VERIFIED     CRITICAL RESULT CALLED TO, READ BACK BY AND VERIFIED WITH:     WAGONER R AT 0008 ON [id number] BY FORSYTH K   BUN 44 (*) 6 - 23 mg/dL   Creatinine, Ser 2.87 (*) 0.50 - 1.10 mg/dL   Calcium 8.4  8.4 - 10.5 mg/dL   GFR calc non Af Amer 21 (*) >90 mL/min   GFR calc Af Amer 25 (*) >90 mL/min   Comment: (NOTE)     The eGFR has been calculated using the CKD EPI equation.     This calculation has not been validated in  all clinical situations.     eGFR's persistently <90 mL/min signify possible Chronic Kidney     Disease.  TSH     Status: Abnormal   Collection Time    01/18/13 11:18 PM      Result Value Range   TSH 5.382 (*) 0.350 - 4.500 uIU/mL   Comment: Performed at [facility]  T4, FREE     Status: None   Collection Time    01/18/13 11:18 PM      Result Value Range   Free T4 1.21  0.80 - 1.80 ng/dL   Comment: Performed at [place], CAPILLARY     Status: Abnormal   Collection Time    01/18/13 11:39 PM      Result Value Range   Glucose-Capillary >600 (*) 70 - 99 mg/dL   Comment 1 Notify RN    GLUCOSE, CAPILLARY     Status: Abnormal   Collection Time    01/19/13 12:36 AM      Result Value Range    Glucose-Capillary >600 (*) 70 - 99 mg/dL   Comment 1 Notify RN    GLUCOSE, CAPILLARY     Status: Abnormal   Collection Time    01/19/13  1:34 AM      Result Value Range   Glucose-Capillary >600 (*) 70 - 99 mg/dL   Comment 1 Notify RN    BASIC METABOLIC PANEL     Status: Abnormal   Collection Time    01/19/13  1:46 AM      Result Value Range   Sodium 137  135 - 145 mEq/L   Potassium 3.5  3.5 - 5.1 mEq/L   Comment: DELTA CHECK NOTED   Chloride 99  96 - 112 mEq/L   CO2 11 (*) 19 - 32 mEq/L   Glucose, Bld 703 (*) 70 - 99 mg/dL   Comment: DELTA CHECK NOTED     CRITICAL RESULT CALLED TO, READ BACK BY AND VERIFIED WITH:     LEE B AT 0320 ON [id number] BY FORSYTH K   BUN 42 (*) 6 - 23 mg/dL   Creatinine, Ser 2.92 (*) 0.50 - 1.10 mg/dL   Calcium 8.4  8.4 - 10.5 mg/dL   GFR calc non Af Amer 21 (*) >90 mL/min   GFR calc Af Amer 24 (*) >90 mL/min   Comment: (NOTE)     The eGFR has been calculated using the CKD EPI equation.     This calculation has not been validated in all clinical situations.     eGFR's persistently <90 mL/min signify possible Chronic Kidney     Disease.  GLUCOSE, CAPILLARY     Status: Abnormal   Collection Time    01/19/13  2:43 AM      Result Value Range   Glucose-Capillary 544 (*) 70 - 99 mg/dL   Comment 1 Notify RN    BASIC METABOLIC PANEL     Status: Abnormal   Collection Time    01/19/13  3:30 AM      Result Value Range   Sodium 142  135 - 145 mEq/L   Potassium 3.1 (*) 3.5 - 5.1 mEq/L   Chloride 105  96 - 112 mEq/L   CO2 14 (*) 19 - 32 mEq/L   Glucose, Bld 495 (*) 70 - 99 mg/dL   BUN 43 (*) 6 - 23 mg/dL   Creatinine, Ser 2.90 (*) 0.50 - 1.10 mg/dL   Calcium 8.5  8.4 -  10.5 mg/dL   GFR calc non Af Amer 21 (*) >90 mL/min   GFR calc Af Amer 24 (*) >90 mL/min   Comment: (NOTE)     The eGFR has been calculated using the CKD EPI equation.     This calculation has not been validated in all clinical situations.     eGFR's persistently <90 mL/min signify possible  Chronic Kidney     Disease.  GLUCOSE, CAPILLARY     Status: Abnormal   Collection Time    01/19/13  3:34 AM      Result Value Range   Glucose-Capillary 448 (*) 70 - 99 mg/dL   Comment 1 Notify RN    GLUCOSE, CAPILLARY     Status: Abnormal   Collection Time    01/19/13  4:38 AM      Result Value Range   Glucose-Capillary 356 (*) 70 - 99 mg/dL   Comment 1 Notify RN    BASIC METABOLIC PANEL     Status: Abnormal   Collection Time    01/19/13  5:41 AM      Result Value Range   Sodium 146 (*) 135 - 145 mEq/L   Potassium 2.9 (*) 3.5 - 5.1 mEq/L   Chloride 112  96 - 112 mEq/L   CO2 17 (*) 19 - 32 mEq/L   Glucose, Bld 258 (*) 70 - 99 mg/dL   BUN 40 (*) 6 - 23 mg/dL   Creatinine, Ser 2.77 (*) 0.50 - 1.10 mg/dL   Calcium 8.7  8.4 - 10.5 mg/dL   GFR calc non Af Amer 22 (*) >90 mL/min   GFR calc Af Amer 26 (*) >90 mL/min   Comment: (NOTE)     The eGFR has been calculated using the CKD EPI equation.     This calculation has not been validated in all clinical situations.     eGFR's persistently <90 mL/min signify possible Chronic Kidney     Disease.  CBC     Status: Abnormal   Collection Time    01/19/13  5:41 AM      Result Value Range   WBC 18.1 (*) 4.0 - 10.5 K/uL   RBC 2.72 (*) 3.87 - 5.11 MIL/uL   Hemoglobin 8.1 (*) 12.0 - 15.0 g/dL   HCT 23.2 (*) 36.0 - 46.0 %   MCV 85.3  78.0 - 100.0 fL   Comment: DELTA CHECK NOTED   MCH 29.8  26.0 - 34.0 pg   MCHC 34.9  30.0 - 36.0 g/dL   RDW 12.9  11.5 - 15.5 %   Platelets 219  150 - 400 K/uL   Comment: DELTA CHECK NOTED  VITAMIN B12     Status: Abnormal   Collection Time    01/19/13  5:41 AM      Result Value Range   Vitamin B-12 999 (*) 211 - 911 pg/mL   Comment: Performed at [place] [facility]     Status: None   Collection Time    01/19/13  5:41 AM      Result Value Range   Folate 11.7     Comment: (NOTE)     Reference Ranges            Deficient:       0.4 - 3.3 ng/mL            Indeterminate:   3.4 - 5.4 ng/mL             Normal:              >  5.4 ng/mL     Performed at [facility]     Status: Abnormal   Collection Time    01/19/13  5:41 AM      Result Value Range   Iron <10 (*) 42 - 135 ug/dL   TIBC Not calculated due to Iron <10.  250 - 470 ug/dL   Saturation Ratios Not calculated due to Iron <10.  20 - 55 %   UIBC 204  125 - 400 ug/dL   Comment: Performed at [facility]     Status: None   Collection Time    01/19/13  5:41 AM      Result Value Range   Ferritin 261  10 - 291 ng/mL   Comment: Performed at [place]     Status: Abnormal   Collection Time    01/19/13  5:41 AM      Result Value Range   Retic Ct Pct 2.9  0.4 - 3.1 %   RBC. 2.72 (*) 3.87 - 5.11 MIL/uL   Retic Count, Manual 78.9  19.0 - 186.0 K/uL  GLUCOSE, CAPILLARY     Status: Abnormal   Collection Time    01/19/13  5:44 AM      Result Value Range   Glucose-Capillary 214 (*) 70 - 99 mg/dL  GLUCOSE, CAPILLARY     Status: Abnormal   Collection Time    01/19/13  6:40 AM      Result Value Range   Glucose-Capillary 174 (*) 70 - 99 mg/dL   Comment 1 Notify RN    GLUCOSE, CAPILLARY     Status: Abnormal   Collection Time    01/19/13  7:31 AM      Result Value Range   Glucose-Capillary 132 (*) 70 - 99 mg/dL  GLUCOSE, CAPILLARY     Status: None   Collection Time    01/19/13  8:46 AM      Result Value Range   Glucose-Capillary 77  70 - 99 mg/dL  BASIC METABOLIC PANEL     Status: Abnormal   Collection Time    01/19/13  9:31 AM      Result Value Range   Sodium 143  135 - 145 mEq/L   Potassium 3.8  3.5 - 5.1 mEq/L   Comment: DELTA CHECK NOTED   Chloride 110  96 - 112 mEq/L   CO2 21  19 - 32 mEq/L   Glucose, Bld 85  70 - 99 mg/dL   BUN 37 (*) 6 - 23 mg/dL   Creatinine, Ser 2.71 (*) 0.50 - 1.10 mg/dL   Calcium 8.8  8.4 - 10.5 mg/dL   GFR calc non Af Amer 23 (*) >90 mL/min   GFR calc Af Amer 26 (*) >90 mL/min   Comment: (NOTE)     The eGFR has been  calculated using the CKD EPI equation.     This calculation has not been validated in all clinical situations.     eGFR's persistently <90 mL/min signify possible Chronic Kidney     Disease.  GLUCOSE, CAPILLARY     Status: None   Collection Time    01/19/13  9:42 AM      Result Value Range   Glucose-Capillary 90  70 - 99 mg/dL  GLUCOSE, CAPILLARY     Status: Abnormal   Collection Time    01/19/13 10:44 AM      Result Value Range   Glucose-Capillary  125 (*) 70 - 99 mg/dL  GLUCOSE, CAPILLARY     Status: Abnormal   Collection Time    01/19/13 11:52 AM      Result Value Range   Glucose-Capillary 121 (*) 70 - 99 mg/dL  GLUCOSE, CAPILLARY     Status: Abnormal   Collection Time    01/19/13 12:46 PM      Result Value Range   Glucose-Capillary 131 (*) 70 - 99 mg/dL  BASIC METABOLIC PANEL     Status: Abnormal   Collection Time    01/19/13  1:11 PM      Result Value Range   Sodium 139  135 - 145 mEq/L   Potassium 4.0  3.5 - 5.1 mEq/L   Chloride 108  96 - 112 mEq/L   CO2 21  19 - 32 mEq/L   Glucose, Bld 136 (*) 70 - 99 mg/dL   BUN 35 (*) 6 - 23 mg/dL   Creatinine, Ser 2.66 (*) 0.50 - 1.10 mg/dL   Calcium 8.4  8.4 - 10.5 mg/dL   GFR calc non Af Amer 23 (*) >90 mL/min   GFR calc Af Amer 27 (*) >90 mL/min   Comment: (NOTE)     The eGFR has been calculated using the CKD EPI equation.     This calculation has not been validated in all clinical situations.     eGFR's persistently <90 mL/min signify possible Chronic Kidney     Disease.  GLUCOSE, CAPILLARY     Status: Abnormal   Collection Time    01/19/13  3:48 PM      Result Value Range   Glucose-Capillary 166 (*) 70 - 99 mg/dL  GLUCOSE, CAPILLARY     Status: Abnormal   Collection Time    01/19/13  4:20 PM      Result Value Range   Glucose-Capillary 176 (*) 70 - 99 mg/dL    Ct Head Wo Contrast  01/18/2013   CLINICAL DATA:  Hyperglycemia.  Confusion.  Diabetes.  EXAM: CT HEAD WITHOUT CONTRAST  TECHNIQUE: Contiguous axial images  were obtained from the base of the skull through the vertex without intravenous contrast.  COMPARISON:  MRI 01/05/2013.  FINDINGS: No mass lesion, mass effect, midline shift, hydrocephalus, hemorrhage. No territorial ischemia or acute infarction. Paranasal sinuses appear within normal limits.  IMPRESSION: Negative CT brain.   Electronically Signed   By: Dereck Ligas M.D.   On: 01/18/2013 20:21   Dg Chest Portable 1 View  01/18/2013   CLINICAL DATA:  Hyperglycemia, diabetes, altered mental status  EXAM: PORTABLE CHEST - 1 VIEW  COMPARISON:  10/01/2011  FINDINGS: The heart size and mediastinal contours are within normal limits. Both lungs are clear. The visualized skeletal structures are unremarkable.  IMPRESSION: No active disease.   Electronically Signed   By: Daryll Brod M.D.   On: 01/18/2013 18:54        Manon Banbury A. Merlene Laughter, M.D.  Diplomate, Tax adviser of Psychiatry and Neurology ( Neurology). 01/19/2013, 8:54 PM

## 2013-01-19 NOTE — Progress Notes (Signed)
Nutrition education   Consult received for DM education. Provided pt mother with written material however pt is sleeping throughout my discussion. Will follow up in a.m. Hopefully she will be more alert at that time.  Colman Cater MS,RD,CSG,LDN Office: [phone] Pager: [phone]

## 2013-01-19 NOTE — Progress Notes (Signed)
Pt c/o generalized itching, specifically around insertion of foley catheter. Dr. [place] Pink paged and made aware. New orders received. Will continue to monitor.

## 2013-01-19 NOTE — Progress Notes (Signed)
No bedtime coverage ordered for pt. CBG 236. Notified MD; orders received. Continue monitoring

## 2013-01-19 NOTE — Plan of Care (Signed)
Problem: Phase I Progression Outcomes Goal: OOB as tolerated unless otherwise ordered Outcome: Completed/Met Date Met:  01/19/13 OOB to [facility] with maximum assist

## 2013-01-19 NOTE — Progress Notes (Signed)
PATIENT DETAILS Name: Tracy [name] Age: 27 y.o. Sex: female Date of Birth: [birth year] Admit Date: 01/18/2013 Admitting Physician Bonnielee Haff, MD [id number], PA-C  Brief summary Patient is a 27 year old Caucasian female with history of type 1 diabetes, chronic kidney disease stage IV, diabetic retinopathy who has been having intermittent headaches since October of this year, presented to the ED with severe diabetic ketoacidosis.  Subjective: Much more awake compared to on admission. Still somewhat lethargic. But clearly improved.  Assessment/Plan: Principal Problem:   DKA (diabetic ketoacidoses) - Patient admitted, and started on IV hydration and IV insulin. Overnight, bicarbonate has significantly improved, however still has an anion gap of 17 this morning. Will continue with IV insulin, till anion gap has closed and then subsequently transitioned her to Lantus/NovoLog. - Not quite sure what exactly provoked DKA, however with fever an infection remains the primary suspicion.  Acute Encephalopathy - Suspect metabolic encephalopathy secondary to severe ketoacidosis with a pH of 7.04. However given fever and leukocytosis, infectious etiology cannot be without at this time. - CT head negative, clinically improved with IV hydration, IV insulin and empiric antimicrobial agents. - Suspect, patient will be back to her usual baseline after DKA is completely corrected.  Systemic inflammatory response syndrome - Secondary to DKA and possible underlying infection - Better with IV fluids, IV insulin and empiric antimicrobial agents  Fever/leukocytosis - Given encephalopathy-lumbar puncture was done in the emergency room on 12/8-CSF analysis from tube 3- is not consistent with meningitis/encephalitis-therefore will stop acyclovir. - No other source of infection is apparent at this time-chest x-ray on 12/8 negative for pneumonia, UA on 12/8 negative for UTI. - Continue with  empiric vancomycin and aztreonam for now-till blood and urine cultures done on 12/8 are available.  Culture data Blood culture 12/8: Pending Urine culture  12/8: Pending CSF culture   12/8: Pending  Antimicrobial data Vancomycin  12/8 >> Aztreonam    12/8 >> Acyclovir       12/8>12/9  Headaches - Seen on 12/8 by [name]- to have suspected migraine headaches. - Currently headaches somewhat better. - Since mental status significantly better, we'll resume Topamax (will resume at 50 mg daily, if tolerates can then be increased to twice a day) and as needed Imitrex -CT head negative - Neurology has been consulted as well.  Acute on chronic kidney disease stage IV - Acute renal failure secondary to diabetic ketoacidosis - Hydrate, and recheck electrolytes in a.m.  Hypokalemia - Secondary to IV insulin- will supplement and recheck.  Anemia - Suspect secondary to chronic kidney disease. Drop in hemoglobin this morning likely secondary to IV fluids and resultant hemodilution.  -No overt blood loss, recheck CBC tomorrow morning.  Hypothyroidism - Continue with levothyroxine.  History of diabetic retinopathy  History of HSV-1 and HSV-2 infection - CSF not suggestive of acute meningoencephalitis-therefore acyclovir discontinued. No evidence of recurrent disease on my exam.  Disposition: Remain inpatient-IN sdu  DVT Prophylaxis: Prophylactic Heparin   Code Status: Full code   Family Communication Mother at bedisde  Procedures:  Lumbar Puncture in the ED 12/8  CONSULTS:  neurology  Time spent 40 minutes-which includes 50% of the time with face-to-face with patient and her mother at bedisde   MEDICATIONS: Scheduled Meds: . acyclovir  650 mg Intravenous Q12H  . antiseptic oral rinse  15 mL Mouth Rinse BID  . aztreonam  1 g Intravenous Q8H  . heparin  5,000 Units Subcutaneous Q8H  . levothyroxine  25 mcg Oral  QAC breakfast  . potassium chloride  10 mEq  Intravenous Q1 Hr x 4  . sodium chloride  3 mL Intravenous Q12H  . vancomycin  1,000 mg Intravenous Q24H   Continuous Infusions: . sodium chloride Stopped (01/19/13 0549)  . dextrose 5 % and 0.45% NaCl 125 mL/hr at 01/19/13 0600  . insulin (NOVOLIN-R) infusion 0.9 Units/hr (01/19/13 0848)   PRN Meds:.acetaminophen, acetaminophen, dextrose, ondansetron (ZOFRAN) IV, ondansetron  Antibiotics: Anti-infectives   Start     Dose/Rate Route Frequency Ordered Stop   01/19/13 1600  vancomycin (VANCOCIN) IVPB 1000 mg/200 mL premix     1,000 mg 200 mL/hr over 60 Minutes Intravenous Every 24 hours 01/18/13 2252     01/19/13 1000  aztreonam (AZACTAM) 1 g in dextrose 5 % 50 mL IVPB     1 g 100 mL/hr over 30 Minutes Intravenous Every 8 hours 01/19/13 0134     01/19/13 0030  aztreonam (AZACTAM) 2 g in dextrose 5 % 50 mL IVPB     2 g 100 mL/hr over 30 Minutes Intravenous  Once 01/19/13 0003 01/19/13 0142   01/18/13 2359  acyclovir (ZOVIRAX) 650 mg in dextrose 5 % 100 mL IVPB     650 mg 113 mL/hr over 60 Minutes Intravenous Every 12 hours 01/18/13 2252     01/18/13 1930  vancomycin (VANCOCIN) IVPB 1000 mg/200 mL premix     1,000 mg 200 mL/hr over 60 Minutes Intravenous  Once 01/18/13 1915 01/18/13 2045       PHYSICAL EXAM: Vital signs in last 24 hours: Filed Vitals:   01/19/13 0150 01/19/13 0311 01/19/13 0400 01/19/13 0500  BP:  143/73 142/74   Pulse:      Temp: 101.2 F (38.4 C) 100.6 F (38.1 C) 100.1 F (37.8 C)   TempSrc: Axillary Axillary Axillary   Resp:  16 19   Height:      Weight:    65.5 kg (144 lb 6.4 oz)  SpO2:  100% 100%     Weight change:  Filed Weights   01/18/13 2238 01/19/13 0500  Weight: 65.1 kg (143 lb 8.3 oz) 65.5 kg (144 lb 6.4 oz)   Body mass index is 25.59 kg/(m^2).   Gen Exam: Awake and alert with clear speech.  Somewhat lethargic Neck: Supple, No JVD.  No meningeal signs. Chest: B/L Clear.   CVS: S1 S2 Regular, no murmurs.  Abdomen: soft, BS +, non  tender, non distended. Extremities: no edema, lower extremities warm to touch. Neurologic: Non Focal.   Skin: No Rash.   Wounds: N/A.    Intake/Output from previous day:  Intake/Output Summary (Last 24 hours) at 01/19/13 0859 Last data filed at 01/19/13 0600  Gross per 24 hour  Intake 1377.8 ml  Output   2100 ml  Net -722.2 ml     LAB RESULTS: CBC  Recent Labs Lab 01/18/13 1106 01/18/13 1830 01/19/13 0541  WBC 20.0* 29.1* 18.1*  HGB 10.9* 9.7* 8.1*  HCT 34.2 32.8* 23.2*  PLT 264 361 219  MCV 92 99.7 85.3  MCH 29.5 29.5 29.8  MCHC 31.9 29.6* 34.9  RDW 12.7 13.4 12.9  LYMPHSABS 1.4 2.0  --   MONOABS  --  0.9  --   EOSABS 0.0 0.0  --   BASOSABS 0.0 0.3*  --     Chemistries   Recent Labs Lab 01/18/13 1830 01/18/13 2318 01/19/13 0146 01/19/13 0330 01/19/13 0541  NA 130* 133* 137 142 146*  K 6.2* 5.2* 3.5 3.1*  2.9*  CL 82* 92* 99 105 112  CO2 <7* <7* 11* 14* 17*  GLUCOSE 1216* 1004* 703* 495* 258*  BUN 45* 44* 42* 43* 40*  CREATININE 2.81* 2.87* 2.92* 2.90* 2.77*  CALCIUM 9.4 8.4 8.4 8.5 8.7    CBG:  Recent Labs Lab 01/19/13 0438 01/19/13 0544 01/19/13 0640 01/19/13 0731 01/19/13 0846  GLUCAP 356* 214* 174* 132* 77    GFR Estimated Creatinine Clearance: 27.5 ml/min (by C-G formula based on Cr of 2.77).  Coagulation profile No results found for this basename: INR, PROTIME,  in the last 168 hours  Cardiac Enzymes No results found for this basename: CK, CKMB, TROPONINI, MYOGLOBIN,  in the last 168 hours  No components found with this basename: POCBNP,  No results found for this basename: DDIMER,  in the last 72 hours No results found for this basename: HGBA1C,  in the last 72 hours No results found for this basename: CHOL, HDL, LDLCALC, TRIG, CHOLHDL, LDLDIRECT,  in the last 72 hours No results found for this basename: TSH, T4TOTAL, FREET3, T3FREE, THYROIDAB,  in the last 72 hours  Recent Labs  01/19/13 0541  RETICCTPCT 2.9   No results  found for this basename: LIPASE, AMYLASE,  in the last 72 hours  Urine Studies No results found for this basename: UACOL, UAPR, USPG, UPH, UTP, UGL, UKET, UBIL, UHGB, UNIT, UROB, ULEU, UEPI, UWBC, URBC, UBAC, CAST, CRYS, UCOM, BILUA,  in the last 72 hours  MICROBIOLOGY: Recent Results (from the past 240 hour(s))  CSF CULTURE     Status: None   Collection Time    01/18/13  9:18 PM      Result Value Range Status   Specimen Description CSF   Final   Special Requests NONE   Final   Gram Stain NO ORGANISMS SEEN   Final   Culture PENDING   Incomplete   Report Status PENDING   Incomplete  MRSA PCR SCREENING     Status: None   Collection Time    01/18/13 10:39 PM      Result Value Range Status   MRSA by PCR NEGATIVE  NEGATIVE Final   Comment:            The GeneXpert MRSA Assay (FDA     approved for NASAL specimens     only), is one component of a     comprehensive MRSA colonization     surveillance program. It is not     intended to diagnose MRSA     infection nor to guide or     monitor treatment for     MRSA infections.    RADIOLOGY STUDIES/RESULTS: Ct Head Wo Contrast  01/18/2013   CLINICAL DATA:  Hyperglycemia.  Confusion.  Diabetes.  EXAM: CT HEAD WITHOUT CONTRAST  TECHNIQUE: Contiguous axial images were obtained from the base of the skull through the vertex without intravenous contrast.  COMPARISON:  MRI 01/05/2013.  FINDINGS: No mass lesion, mass effect, midline shift, hydrocephalus, hemorrhage. No territorial ischemia or acute infarction. Paranasal sinuses appear within normal limits.  IMPRESSION: Negative CT brain.   Electronically Signed   By: Dereck Ligas M.D.   On: 01/18/2013 20:21   Mr Brain Wo Contrast  01/05/2013   CLINICAL DATA:  Severe headaches with nausea and vomiting for the past 4 weeks. No known injury.  EXAM: MRI HEAD WITHOUT CONTRAST  TECHNIQUE: Multiplanar, multiecho pulse sequences of the brain and surrounding structures were obtained without intravenous  contrast.  COMPARISON:  None.  FINDINGS: There is no evidence of acute infarct. Ventricles and sulci are normal. Major intracranial flow voids are normal. There is no evidence of intracranial hemorrhage, mass, midline shift, or extra-axial fluid collection. No brain parenchymal signal abnormality is identified. Orbits and visualized paranasal sinuses are unremarkable. Calvarium and scalp soft tissues are unremarkable.  IMPRESSION: Unremarkable noncontrast brain MRI.   Electronically Signed   By: Logan Bores   On: 01/05/2013 15:18   Dg Chest Portable 1 View  01/18/2013   CLINICAL DATA:  Hyperglycemia, diabetes, altered mental status  EXAM: PORTABLE CHEST - 1 VIEW  COMPARISON:  10/01/2011  FINDINGS: The heart size and mediastinal contours are within normal limits. Both lungs are clear. The visualized skeletal structures are unremarkable.  IMPRESSION: No active disease.   Electronically Signed   By: Daryll Brod M.D.   On: 01/18/2013 18:54    Oren Binet, MD  [facility] Pager:[phone]  If 7PM-7AM, please contact night-coverage [email or web address] Password [email or web address] 01/19/2013, 8:59 AM   LOS: 1 day

## 2013-01-19 NOTE — Progress Notes (Signed)
Went in to talk with patient about diabetes control and outpatient regimen for diabetes management.  Patient is very drowsy and not engaged in conversation at this time.  However, patient's mother Tracy [name]) is at bedside. Per patient's mother, "Tracy has been having issues since mid October when she burned leaves."  According to Tracy [name] had to come to the hospital after burning leaves because "she was seeing multiple piles of leaves and then she could not see at all".  According to [facility], patient's vision is back to baseline now.  However, Tracy [name] has been complaining with headaches since then and saw a neurologist yesterday prior to coming to the hospital.    Per patient's mother, Tracy [name] sees Dr. Loanne Tracy [name] in [place] for diabetes management.  She was on an insulin pump in the past but was taken off of it because the "doctor said the pump wasn't working for her".  She is currnently on Lantus and Humalog for diabetes management.  Per Tracy [name] does not check her blood sugar as much as she should but she feels that she does pretty good."    Tracy [name] has had diabetes since the age of 27 years old and her mother has helped her manage her diabetes.  Tracy Mood states that she has tried to step back and let [name] take responsibility for her diabetes more over the past few years.  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  She got married in May of this year and bought a house that needs a lot more work than was anticipated.  Tracy Mood feels that Tracy [name] has been more stressed lately and contributes the stress to some of the issues with diabetes control.    Discussed impact of stress, sickness, nutrition, and exercise on diabetes control with patient's mother.  Explained basics of diabetic ketoacidosis.  Tracy Mood states that she feels that Tracy [name] would benefit greatly from diabetes education, especially nutrition  education.  Informed her about free outpatient diabetes education classes here at [name] and encouraged her to come to one of the classes with Tracy [name].  Tracy Mood verbalized understanding of information discussed and was appreciative of visit and information.  Plan to come back in the morning to see patient and hopefully she will be more alert and able to engage in conversation.    Thanks, Barnie Alderman, RN, MSN, CCRN Diabetes Coordinator Inpatient Diabetes Program [phone] (Team Pager) [phone] (AP office) [phone] [facility] office)

## 2013-01-19 NOTE — Care Management Note (Addendum)
    Page 1 of 1   01/22/2013     12:34:30 PM   CARE MANAGEMENT NOTE 01/22/2013  Patient:  Tracy [name], Tracy [name]   Account Number:  [id number]  Date Initiated:  01/19/2013  Documentation initiated by:  Theophilus Kinds  Subjective/Objective Assessment:   Pt admitted from home with DKA. Pt lives with her husband and will return home at discharge. Pt has been independent with ADL's.     Action/Plan:   Pt is very sleepy and not able to participate in conversation. Pts mother is at the bedside and providing this information. Will followup with pt once more awake.   Anticipated DC Date:  01/21/2013   Anticipated DC Plan:  [place]  CM consult      Choice offered to / List presented to:             Status of service:  Completed, signed off Medicare Important Message given?   (If response is "NO", the following Medicare IM given date fields will be blank) Date Medicare IM given:   Date Additional Medicare IM given:    Discharge Disposition:  HOME/SELF CARE  Per UR Regulation:    If discussed at Long Length of Stay Meetings, dates discussed:    Comments:  01/22/13 [place], RN BSN CM Pt discharged home today, No CM needs noted.  01/19/13 [place], RN BSN CM

## 2013-01-19 NOTE — Progress Notes (Signed)
UR chart review completed.  

## 2013-01-19 NOTE — Progress Notes (Signed)
CRITICAL VALUE ALERT  Critical value received:  Glucose 703  Date of notification:  01/19/13  Time of notification:  0328  Critical value read back:yes  Nurse who received alert: Sharyn Blitz  MD notified (1st page):    Time of first page:    MD notified (2nd page):  Time of second page:  Responding MD:    Time MD responded:    Pt on glucostabilizer/insulin gtt.

## 2013-01-20 DIAGNOSIS — E1039 Type 1 diabetes mellitus with other diabetic ophthalmic complication: Secondary | ICD-10-CM

## 2013-01-20 LAB — COMPREHENSIVE METABOLIC PANEL
ALT: 18 U/L (ref 0–35)
Albumin: 2 g/dL — ABNORMAL LOW (ref 3.5–5.2)
Alkaline Phosphatase: 96 U/L (ref 39–117)
BUN: 29 mg/dL — ABNORMAL HIGH (ref 6–23)
Chloride: 106 mEq/L (ref 96–112)
Creatinine, Ser: 2.56 mg/dL — ABNORMAL HIGH (ref 0.50–1.10)
Glucose, Bld: 222 mg/dL — ABNORMAL HIGH (ref 70–99)
Potassium: 3.5 mEq/L (ref 3.5–5.1)
Total Bilirubin: <0.1 mg/dL — ABNORMAL LOW (ref 0.3–1.2)

## 2013-01-20 LAB — CBC
HCT: 23.9 % — ABNORMAL LOW (ref 36.0–46.0)
Hemoglobin: 8 g/dL — ABNORMAL LOW (ref 12.0–15.0)
MCH: 29 pg (ref 26.0–34.0)
MCHC: 33.5 g/dL (ref 30.0–36.0)
MCV: 86.6 fL (ref 78.0–100.0)
Platelets: 195 K/uL (ref 150–400)
RBC: 2.76 MIL/uL — ABNORMAL LOW (ref 3.87–5.11)
RDW: 13.4 % (ref 11.5–15.5)
WBC: 13.5 K/uL — ABNORMAL HIGH (ref 4.0–10.5)

## 2013-01-20 LAB — GLUCOSE, CAPILLARY
Glucose-Capillary: 173 mg/dL — ABNORMAL HIGH (ref 70–99)
Glucose-Capillary: 241 mg/dL — ABNORMAL HIGH (ref 70–99)
Glucose-Capillary: 111 mg/dL — ABNORMAL HIGH (ref 70–99)
Glucose-Capillary: 92 mg/dL (ref 70–99)

## 2013-01-20 LAB — URINE CULTURE

## 2013-01-20 LAB — HERPES SIMPLEX VIRUS(HSV) DNA BY PCR
HSV 1 DNA: NOT DETECTED
HSV 2 DNA: NOT DETECTED

## 2013-01-20 LAB — HEMOGLOBIN A1C: Hgb A1c MFr Bld: 16.8 % — ABNORMAL HIGH (ref <5.7)

## 2013-01-20 MED ORDER — INSULIN GLARGINE 100 UNIT/ML ~~LOC~~ SOLN
28.0000 [IU] | Freq: Every day | SUBCUTANEOUS | Status: DC
  Administered 2013-01-20 – 2013-01-22 (×3): 28 [IU] via SUBCUTANEOUS
  Filled 2013-01-20 (×8): qty 0.28

## 2013-01-20 MED ORDER — INSULIN GLARGINE 100 UNIT/ML ~~LOC~~ SOLN: 28.0000 [IU] | Freq: Every day | SUBCUTANEOUS | Status: DC

## 2013-01-20 MED ORDER — INSULIN ASPART 100 UNIT/ML ~~LOC~~ SOLN: 0.0000 [IU] | Freq: Every day | SUBCUTANEOUS | Status: DC

## 2013-01-20 MED ORDER — SODIUM CHLORIDE 0.9 % IV SOLN
INTRAVENOUS | Status: DC
  Administered 2013-01-20 – 2013-01-21 (×3): via INTRAVENOUS

## 2013-01-20 MED ORDER — ACETAMINOPHEN 160 MG/5ML PO SOLN
650.0000 mg | Freq: Four times a day (QID) | ORAL | Status: DC | PRN
  Administered 2013-01-20: 650 mg via ORAL
  Filled 2013-01-20: qty 20.3

## 2013-01-20 MED ORDER — INSULIN ASPART 100 UNIT/ML ~~LOC~~ SOLN
0.0000 [IU] | Freq: Three times a day (TID) | SUBCUTANEOUS | Status: DC
  Administered 2013-01-20 – 2013-01-21 (×2): 2 [IU] via SUBCUTANEOUS
  Administered 2013-01-21: 3 [IU] via SUBCUTANEOUS
  Administered 2013-01-21: 2 [IU] via SUBCUTANEOUS
  Administered 2013-01-22: 3 [IU] via SUBCUTANEOUS

## 2013-01-20 MED ORDER — TOPIRAMATE 25 MG PO TABS
ORAL_TABLET | ORAL | Status: AC
  Filled 2013-01-20: qty 1

## 2013-01-20 MED ORDER — TOPIRAMATE 25 MG PO TABS
25.0000 mg | ORAL_TABLET | Freq: Every day | ORAL | Status: DC
  Administered 2013-01-21 – 2013-01-22 (×2): 25 mg via ORAL
  Filled 2013-01-20 (×6): qty 1

## 2013-01-20 MED ORDER — INFLUENZA VAC SPLIT QUAD 0.5 ML IM SUSP
0.5000 mL | INTRAMUSCULAR | Status: AC
  Administered 2013-01-20: 0.5 mL via INTRAMUSCULAR
  Filled 2013-01-20: qty 0.5

## 2013-01-20 NOTE — Progress Notes (Signed)
Inpatient Diabetes Program Recommendations  AACE/ADA: New Consensus Statement on Inpatient Glycemic Control (2013)  Target Ranges:  Prepandial:   less than 140 mg/dL      Peak postprandial:   less than 180 mg/dL (1-2 hours)      Critically ill patients:  140 - 180 mg/dL   Results for Tracy [name], Tracy [name] (MRN [id number]) as of 01/20/2013 08:04  Ref. Range 01/19/2013 12:46 01/19/2013 15:48 01/19/2013 16:20 01/19/2013 21:36 01/20/2013 01:59 01/20/2013 07:21  Glucose-Capillary Latest Range: 70-99 mg/dL 131 (H) 166 (H) 176 (H) 236 (H) 173 (H) 241 (H)   Inpatient Diabetes Program Recommendations Insulin - Basal: Please consider increasing Lantus to 28 units daily. Correction (SSI): Please consider ordering Novolog bedtime correction scale. Insulin - Meal Coverage: If patient is eating and tolerating diet, please consider ordering Novolog meal coverage; recommend Novolog 1 unit for every 15 grams of carbs (0-6 units TID with meals).  Thanks,  Barnie Alderman, RN, MSN, CCRN Diabetes Coordinator Inpatient Diabetes Program [phone] (Team Pager) [phone] (AP office) [phone] [facility] office)

## 2013-01-20 NOTE — Progress Notes (Signed)
Nutrition Note  Attempted two times to talk with pt today. She is sleeping each time. If she awakens for education follow-up please re-consult.  Colman Cater MS,RD,CSG,LDN Office: [phone] Pager: [phone]

## 2013-01-20 NOTE — Progress Notes (Signed)
Spoke with Tracy [name] about diabetes and outpatient regimen for diabetes control.  Tracy [name] reports that she currently takes Lantus 25 units QHS, Humalog 10 units with breakfast, Humalog 5 units with lunch, and Humalog 10 units with supper for outpatient diabetes management.  Tracy [name] states that she now sees [name] (Endocrinologist in [place]) and has only seen him for about a month.  Prior to that she was seeing Dr. Dorris Fetch (Endocrinologist in [place]) for diabetes management but changed doctors because she felt she "did not understand him".   Tracy [name] was on an insulin pump for 4 years and Dr. Dorris Fetch stopped the insulin pump almost one year ago because her diabetes was "out of control".  She has been on Lantus and Humalog since being taken off of the insulin pump.  Discussed A1C results (16.1% on 01/18/2013) and explained how increased blood glucose levels damage blood vessels and increases risk of developing complications related to uncontrolled diabetes. Explained basic physiology of DKA and the effects of ketones. When asked how often Tracy [name] is checking her blood sugar she reports that she does not check it regularly, maybe once or twice a week.  Asked why she did not check it several times a day, she reports she forgets and just doesn't feel like doing it.  Discussed importance of checking CBGs and maintaining good CBG control to prevent long-term and short-term complications and asked Tracy [name] what she felt she needed to help her check her blood glucose.  She states that she is a grown woman and it is her responsibility to check her sugar.  Asked if she felt that she could ask her family to help her remember to check her blood glucose, she states that she feels that will only irritate her if they ask her about it.  Suggested that she set alarms on her smart phone to help her remember to check blood glucose.  Tracy [name] agrees that she could do that.  Stressed the fact that if she is not checking her blood sugars  consistently then she is not able to correct glucose with Humalog if she does not know where she is starting from.  Explained impact of stress, sickness, nutrition, and exercise on diabetes control.  Tracy [name] states that she feels she tries to eat the right things, but it seems that no matter if she tries to do good with her diet her blood sugar is still high.  Tracy [name] admits that she guesses most of the time about number of carbohydrates she is eating.  Asked if she has been through any formal nutrition education and she reports that she has not ever attended any type of nutrition education.  Informed her about free outpatient diabetes education class at [facility] and provided her with handout and contact information for the class.  Also informed her about [facility] in [place].  Informed Tracy [name] about free app (Calorie [employer]) that can be downloaded on her smart phone which provides number of carbohydrates in various foods.  Inquired about how many carbohydrates were covered with unit of insulin when she was on her insulin pump and she states that 1 unit of insulin covered 10 grams of carbs for her.     Throughout conversation with the Tracy [name], she was very tearful.  Asked if she felt depressed and/or overwhelmed with diabetes management.  Tracy [name] stated that she does feel both depressed and overwhelmed at times. She states that she was started on Lexapro for depression and took it for  a while and was feeling better but then she stopped because she got tired of "taking another pill and I am sick of taking medicine."  Explained how depression and burden of diabetes management impact motivation to take care of herself and get her diabetes under control.  Encouraged her to talk with her doctor about the way she was feeling.  Tracy [name] was appreciative of information discussed and verbalized understanding.  She states that she does not have any further questions at this  time related to diabetes.  Will continue to follow.  Thanks, Barnie Alderman, RN, MSN, CCRN Diabetes Coordinator [facility] [phone] (Team Pager) [phone] ([facility] office) [phone] [facility] office)

## 2013-01-20 NOTE — Progress Notes (Signed)
[name] PROGRESS NOTE  Tracy [name] [id number] DOB: [birth year] DOA: 01/18/2013 PCP: [name] [name] in [place] for diabetes management  Assessment/Plan: 1. Severe diabetic ketoacidosis: Resolved with insulin infusion and supportive care. Provoking factor unclear infection was considered, urine culture negative, blood cultures no growth today, chest x-ray and urinalysis unremarkable. LP studies were unremarkable. Given poor control off pump as well as lack of checking blood sugars at home and markedly elevated hemoglobin A1c, and may be simply secondary to poor compliance.  2. Acute encephalopathy:  resolved. Suspected to be metabolic in nature secondary to severe ketoacidosis, however given her fever and leukocytosis infectious etiology could not be ruled out. CT head was negative. Improved with IV hydration, insulin and empiric antibiotics.Favor noninfectious. Likely stop antibiotics in the next 24 hours pending culture results.  3. SIRS:  appears resolved clinically. Secondary to DKA, possible underlying infection. 4. Fever, leukocytosis: Because of encephalopathy on presentation lumbar puncture was performed in the emergency department. CSF analysis was not consistent with meningitis or encephalitis, acyclovir was stopped. No other apparent source of infection with negative chest x-ray and urinalysis. Continues on vancomycin and aztreonam for now until  CSF culture negative. 5. Headaches: Since October developed global headaches, sometimesright side  with blurred vision, nausea, vomiting. Followed by neurology, seen recently by Dr. Gaylyn Cheers to have migraine headaches. Started on topiramate, Zomig nasal spray. Has followup with neurology in 2 weeks. Followup neurology recommendations. MRI brain 01/05/2013 was unremarkable. CT head on admission unremarkable. 6. Diabetes mellitus type 1: With associated diabetic retinopathy, chronic kidney disease stage III.  Hemoglobin A1c 16.1. Long-acting insulin, will need close outpatient followup.  7. Acute renal failure superimposed on Chronic kidney disease stage III: Acute renal failure resolved.Appears to be near baseline. Continue IV fluids.  8. Acute on chronic normocytic anemia anemia: Suspect secondary to chronic kidney disease with acute component secondary to acute illness and hemodilution. No overt blood loss. 9. Hypothyroidism: Continue levothyroxine 10. History of HSV-1 and HSV-2 infection - CSF not suggestive of acute meningoencephalitis-therefore acyclovir was discontinued. 11. Possible allergic reaction to Topamax, and became confused soon after starting dose--discontinue Topamax.  12. TSH mildly elevated, followup as an outpatient. Free T4 normal.  Discussed laboratory studies, imaging results, current diagnoses and treatment plan with husband and mother at bedside.   Increase Lantus to 28 units daily  Add sliding scale NovoLog coverage for bedtime  Consider meal coverage  Continue IV fluids, empiric antibiotics, followup culture  Transfer to medical floor  Microbiology:    blood cultures 12/8--no growth to date  Urine culture 12/8--no growth, final  CSF culture 12/8 --no organisms seen. Culture pending.  Code Status: Full code DVT prophylaxis: heparin Family Communication: as above Disposition Plan: home when improved  Murray Hodgkins, MD  [name]  Pager [phone] If 7PM-7AM, please contact night-coverage at [email or web address], password [facility] 01/20/2013, 9:39 AM  LOS: 2 days   Summary: 27 year old woman with history of diabetes mellitus type 1 with ongoing headaches since October 2014 seen by neurologist 12/8 as an outpatient, diagnosed with migraines, treated with IV medication and prescribed Topamax. She took one dose of Topamax, became confused, had hallucinations. Blood sugar was quite high and she was referred to the emergency department. She was admitted for  severe diabetic ketoacidosis with profound hyperglycemia, acute on chronic renal failure with hyperkalemia, leukocytosis, low-grade fever, dehydration, encephalopathy.  Consultants:  Neurology  Procedures:  Lumbar Puncture in the ED 12/8  Antibiotics: Vancomycin 12/8 >>  Aztreonam  12/8 >>  Acyclovir 12/8>12/9  HPI/Subjective: Feels much better. Tired but no other complaints. Specifically no headache or other pain. No nausea or vomiting. Was able to eat a little bit this morning and last night.  Objective: Filed Vitals:   01/20/13 0400 01/20/13 0500 01/20/13 0600 01/20/13 0730  BP: 138/89 125/94 132/92   Pulse:      Temp: 98.5 F (36.9 C)   98.4 F (36.9 C)  TempSrc: Axillary   Oral  Resp: 12 14 15    Height:      Weight:  65.3 kg (143 lb 15.4 oz)    SpO2:        Intake/Output Summary (Last 24 hours) at 01/20/13 0939 Last data filed at 01/20/13 0700  Gross per 24 hour  Intake 1570.6 ml  Output   1750 ml  Net -179.4 ml     Filed Weights   01/18/13 2238 01/19/13 0500 01/20/13 0500  Weight: 65.1 kg (143 lb 8.3 oz) 65.5 kg (144 lb 6.4 oz) 65.3 kg (143 lb 15.4 oz)    Exam:   Afebrile, vital signs stable  General: Appears calm and comfortable. Speech is fluent and clear.  Psychiatric: Grossly normal mood and affect. Speech fluent and appropriate. Alert, oriented to self, hospital, month, year, family members.  Eyes: Pupils equal, round, reactive to light. Normal lids, irises.  ENT: Grossly normal lips and tongue.  Cardiovascular: Regular rate and rhythm. No murmur, rub or gallop. No lower extremity edema.  Respiratory: Clear to auscultation bilaterally. No wheezes, rales or rhonchi. Normal respiratory effort.  Abdomen: Soft, nontender, nondistended.  Skin: No rash or induration seen.  Musculoskeletal: Grossly normal tone and strength all extremities.  Neurologic: Grossly nonfocal.  Data Reviewed:   adequate urine output  Capillary blood sugars  100s-200s    CSF fluid analysis not consistent with meningitis or encephalitis  BUN, creatinine improving, 29/2.56  Marked leukocytosis rapidly decreasing, 18.1 >> 13.5  Hemoglobin 8.0, stable  Scheduled Meds: . antiseptic oral rinse  15 mL Mouth Rinse BID  . aztreonam  1 g Intravenous Q8H  . heparin  5,000 Units Subcutaneous Q8H  . [START ON 01/21/2013] influenza vac split quadrivalent PF  0.5 mL Intramuscular Tomorrow-1000  . insulin aspart  0-9 Units Subcutaneous TID WC  . insulin glargine  25 Units Subcutaneous Daily  . levothyroxine  25 mcg Oral QAC breakfast  . sodium chloride  3 mL Intravenous Q12H  . vancomycin  1,000 mg Intravenous Q24H   Continuous Infusions: . sodium chloride Stopped (01/19/13 0549)  . 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

## 2013-01-20 NOTE — Progress Notes (Signed)
Patient ID: Tracy [name], female   DOB: Apr 14, 1985, 27 y.o.   MRN: [id number]  [place] A. Merlene Laughter, MD     [email or web address]          Tracy [name] is an 27 y.o. female.   Assessment/Plan: Migraine Doubt allergy to topamax. Trial of low topamax.    1. Resolved toxic metabolic encephalopathy. 2. Episodic headaches most likely migrainous in nature. I do not believe that the Topamax contributed significantly to the encephalopathy and therefore I think we can restart this. How would, the dose will be cut in half and started a 25 mg and increased dose to 50 mg after a week. The patient complains of having low back pain where she had a spinal tap. She does not have a headache. She otherwise is doing fairly well. She did go to the bathroom and did well with this.  She is sleeping but easily arousable. She is lucid and coherent. The site of the spinal tap looks good without evidence of swelling, erythema or other problems.  Objective: Vital signs in last 24 hours: Temp:  [98.4 F (36.9 C)-98.8 F (37.1 C)] 98.4 F (36.9 C) (12/10 2011) Pulse Rate:  [94-99] 94 (12/10 2011) Resp:  [12-18] 16 (12/10 2011) BP: (125-145)/(78-94) 126/83 mmHg (12/10 2011) SpO2:  [100 %] 100 % (12/10 2011) Weight:  [65.3 kg (143 lb 15.4 oz)] 65.3 kg (143 lb 15.4 oz) (12/10 0500)  Intake/Output from previous day: 12/09 0701 - 12/10 0700 In: 2813.2 [P.O.:1560; I.V.:903.2; IV Piggyback:350] Out: 1750 [Urine:1750] Intake/Output this shift:   Nutritional status: Carb Control   Lab Results: Results for orders placed during the hospital encounter of 01/18/13 (from the past 48 hour(s))  CSF CELL COUNT WITH DIFFERENTIAL     Status: Abnormal   Collection Time    01/18/13  9:18 PM      Result Value Range   Tube # 1     Color, CSF CLEAR (*) COLORLESS   Appearance, CSF CLEAR  CLEAR   Supernatant NOT INDICATED     RBC Count, CSF 14500 (*) 0 /cu mm   WBC, CSF 0  0 - 5 /cu mm   Segmented Neutrophils-CSF TOO FEW TO COUNT, SMEAR AVAILABLE FOR REVIEW  0 - 6 %   Lymphs, CSF TOO FEW TO COUNT, SMEAR AVAILABLE FOR REVIEW  40 - 80 %   Monocyte-Macrophage-Spinal Fluid TOO FEW TO COUNT, SMEAR AVAILABLE FOR REVIEW  15 - 45 %   Eosinophils, CSF TOO FEW TO COUNT, SMEAR AVAILABLE FOR REVIEW  0 - 1 %   Other Cells, CSF TOO FEW TO COUNT, SMEAR AVAILABLE FOR REVIEW    CSF CELL COUNT WITH DIFFERENTIAL     Status: Abnormal   Collection Time    01/18/13  9:18 PM      Result Value Range   Tube # 3     Color, CSF CLEAR (*) COLORLESS   Appearance, CSF CLEAR  CLEAR   Supernatant NOT INDICATED     RBC Count, CSF 0  0 /cu mm   WBC, CSF 0  0 - 5 /cu mm   Segmented Neutrophils-CSF TOO FEW TO COUNT, SMEAR AVAILABLE FOR REVIEW  0 - 6 %   Lymphs, CSF TOO FEW TO COUNT, SMEAR AVAILABLE FOR REVIEW  40 - 80 %   Monocyte-Macrophage-Spinal Fluid TOO FEW TO COUNT, SMEAR AVAILABLE FOR REVIEW  15 - 45 %   Eosinophils, CSF TOO FEW TO COUNT, SMEAR AVAILABLE FOR REVIEW  0 - 1 %   Other Cells, CSF TOO FEW TO COUNT, SMEAR AVAILABLE FOR REVIEW    CSF CULTURE     Status: None   Collection Time    01/18/13  9:18 PM      Result Value Range   Specimen Description CSF     Special Requests NONE     Gram Stain       Value: CYTOSPIN SLIDE NO WBC SEEN     NO ORGANISMS SEEN     Performed at [facility]     Performed at [facility]   Culture       Value: NO GROWTH 1 DAY     Performed at [facility]   Report Status PENDING    GLUCOSE, CSF     Status: Abnormal   Collection Time    01/18/13  9:18 PM      Result Value Range   Glucose, CSF 477 (*) 43 - 76 mg/dL  PROTEIN, CSF     Status: None   Collection Time    01/18/13  9:18 PM      Result Value Range   Total  Protein, CSF 24  15 - 45 mg/dL  HERPES SIMPLEX VIRUS(HSV) DNA BY PCR     Status: None   Collection Time    01/18/13  9:18 PM      Result Value Range   Specimen source hsv CSF     HSV 1 DNA Not Detected  Not Detected     HSV 2 DNA Not Detected  Not Detected   Comment: (NOTE)     Note:     This assay detects the presence of herpes simplex virus (HSV) DNA by     real-time polymerase chain reaction (PCR) amplification of the virus     polymerase gene.  A result "DETECTED" is reported if a fluorescent     signal for HSV DNA is detected. If HSV DNA is present, the virus is     further characterized into subtype 1 or subtype 2 according     type-specific differences in melting temperature. The assay is     performed in the presence of an internal PCR control to ensure     efficient sample extraction and the absence of PCR inhibitors in the     sample.     This test was developed and its performance characteristics have been     determined by [facility]. Performance characteristics refer     to the analytical performance of the test. This test has not been     cleared or approved by the [place] Food and Drug Administration. The FDA     has determined that such clearance or approval is not necessary. This     laboratory is certified under the [place] as qualified to perform high complexity clinical     laboratory testing.     Performed at [place], [place]     Status: Abnormal   Collection Time    01/18/13  9:39 PM      Result Value Range   Glucose-Capillary >600 (*) 70 - 99 mg/dL  MRSA PCR SCREENING     Status: None   Collection Time    01/18/13 10:39 PM      Result Value Range   MRSA by PCR NEGATIVE  NEGATIVE   Comment:  The GeneXpert MRSA Assay (FDA     approved for NASAL specimens     only), is one component of a     comprehensive MRSA colonization     surveillance program. It is not     intended to diagnose MRSA     infection nor to guide or     monitor treatment for     MRSA infections.  GLUCOSE, [facility]     Status: Abnormal   Collection Time    01/18/13 10:40 PM      Result Value Range    Glucose-Capillary >600 (*) 70 - 99 mg/dL   Comment 1 Notify RN    LACTIC ACID, PLASMA     Status: Abnormal   Collection Time    01/18/13 11:18 PM      Result Value Range   Lactic Acid, Venous 7.2 (*) 0.5 - 2.2 mmol/L  HEMOGLOBIN A1C     Status: Abnormal   Collection Time    01/18/13 11:18 PM      Result Value Range   Hemoglobin A1C 16.1 (*) <5.7 %   Comment: (NOTE)                                                                               According to the ADA Clinical Practice Recommendations for 2011, when     HbA1c is used as a screening test:      >=6.5%   Diagnostic of Diabetes Mellitus               (if abnormal result is confirmed)     5.7-6.4%   Increased risk of developing Diabetes Mellitus     References:Diagnosis and Classification of Diabetes Mellitus,Diabetes     [id number] 1):S62-S69 and Standards of Medical Care in             Diabetes - 2011,Diabetes Care,2011,34 (Suppl 1):S11-S61.   Mean Plasma Glucose 415 (*) <117 mg/dL   Comment: Performed at [place]     Status: Abnormal   Collection Time    01/18/13 11:18 PM      Result Value Range   Sodium 133 (*) 135 - 145 mEq/L   Potassium 5.2 (*) 3.5 - 5.1 mEq/L   Chloride 92 (*) 96 - 112 mEq/L   Comment: DELTA CHECK NOTED   CO2 <7 (*) 19 - 32 mEq/L   Comment: CRITICAL RESULT CALLED TO, READ BACK BY AND VERIFIED WITH:     WAGONER R AT 2356 ON [id number] BY FORSYTH K     DELTA CHECK NOTED   Glucose, Bld 1004 (*) 70 - 99 mg/dL   Comment: RESULT REPEATED AND VERIFIED     CRITICAL RESULT CALLED TO, READ BACK BY AND VERIFIED WITH:     WAGONER R AT 0008 ON [id number] BY FORSYTH K   BUN 44 (*) 6 - 23 mg/dL   Creatinine, Ser 2.87 (*) 0.50 - 1.10 mg/dL   Calcium 8.4  8.4 - 10.5 mg/dL   GFR calc non Af Amer 21 (*) >90 mL/min   GFR calc Af Amer 25 (*) >90 mL/min   Comment: (NOTE)     The eGFR has been  calculated using the CKD EPI equation.     This calculation has not been validated in  all clinical situations.     eGFR's persistently <90 mL/min signify possible Chronic Kidney     Disease.  TSH     Status: Abnormal   Collection Time    01/18/13 11:18 PM      Result Value Range   TSH 5.382 (*) 0.350 - 4.500 uIU/mL   Comment: Performed at [facility]  T4, FREE     Status: None   Collection Time    01/18/13 11:18 PM      Result Value Range   Free T4 1.21  0.80 - 1.80 ng/dL   Comment: Performed at [place], [place]     Status: Abnormal   Collection Time    01/18/13 11:39 PM      Result Value Range   Glucose-Capillary >600 (*) 70 - 99 mg/dL   Comment 1 Notify RN    GLUCOSE, [facility]     Status: Abnormal   Collection Time    01/19/13 12:36 AM      Result Value Range   Glucose-Capillary >600 (*) 70 - 99 mg/dL   Comment 1 Notify RN    GLUCOSE, [facility]     Status: Abnormal   Collection Time    01/19/13  1:34 AM      Result Value Range   Glucose-Capillary >600 (*) 70 - 99 mg/dL   Comment 1 Notify RN    BASIC METABOLIC PANEL     Status: Abnormal   Collection Time    01/19/13  1:46 AM      Result Value Range   Sodium 137  135 - 145 mEq/L   Potassium 3.5  3.5 - 5.1 mEq/L   Comment: DELTA CHECK NOTED   Chloride 99  96 - 112 mEq/L   CO2 11 (*) 19 - 32 mEq/L   Glucose, Bld 703 (*) 70 - 99 mg/dL   Comment: DELTA CHECK NOTED     CRITICAL RESULT CALLED TO, READ BACK BY AND VERIFIED WITH:     LEE B AT 0320 ON [id number] BY FORSYTH K   BUN 42 (*) 6 - 23 mg/dL   Creatinine, Ser 2.92 (*) 0.50 - 1.10 mg/dL   Calcium 8.4  8.4 - 10.5 mg/dL   GFR calc non Af Amer 21 (*) >90 mL/min   GFR calc Af Amer 24 (*) >90 mL/min   Comment: (NOTE)     The eGFR has been calculated using the CKD EPI equation.     This calculation has not been validated in all clinical situations.     eGFR's persistently <90 mL/min signify possible Chronic Kidney     Disease.  GLUCOSE, [facility]     Status: Abnormal   Collection Time    01/19/13  2:43 AM      Result  Value Range   Glucose-Capillary 544 (*) 70 - 99 mg/dL   Comment 1 Notify RN    BASIC METABOLIC PANEL     Status: Abnormal   Collection Time    01/19/13  3:30 AM      Result Value Range   Sodium 142  135 - 145 mEq/L   Potassium 3.1 (*) 3.5 - 5.1 mEq/L   Chloride 105  96 - 112 mEq/L   CO2 14 (*) 19 - 32 mEq/L   Glucose, Bld 495 (*) 70 - 99 mg/dL   BUN 43 (*) 6 - 23  mg/dL   Creatinine, Ser 2.90 (*) 0.50 - 1.10 mg/dL   Calcium 8.5  8.4 - 10.5 mg/dL   GFR calc non Af Amer 21 (*) >90 mL/min   GFR calc Af Amer 24 (*) >90 mL/min   Comment: (NOTE)     The eGFR has been calculated using the CKD EPI equation.     This calculation has not been validated in all clinical situations.     eGFR's persistently <90 mL/min signify possible Chronic Kidney     Disease.  GLUCOSE, [facility]     Status: Abnormal   Collection Time    01/19/13  3:34 AM      Result Value Range   Glucose-Capillary 448 (*) 70 - 99 mg/dL   Comment 1 Notify RN    GLUCOSE, [facility]     Status: Abnormal   Collection Time    01/19/13  4:38 AM      Result Value Range   Glucose-Capillary 356 (*) 70 - 99 mg/dL   Comment 1 Notify RN    BASIC METABOLIC PANEL     Status: Abnormal   Collection Time    01/19/13  5:41 AM      Result Value Range   Sodium 146 (*) 135 - 145 mEq/L   Potassium 2.9 (*) 3.5 - 5.1 mEq/L   Chloride 112  96 - 112 mEq/L   CO2 17 (*) 19 - 32 mEq/L   Glucose, Bld 258 (*) 70 - 99 mg/dL   BUN 40 (*) 6 - 23 mg/dL   Creatinine, Ser 2.77 (*) 0.50 - 1.10 mg/dL   Calcium 8.7  8.4 - 10.5 mg/dL   GFR calc non Af Amer 22 (*) >90 mL/min   GFR calc Af Amer 26 (*) >90 mL/min   Comment: (NOTE)     The eGFR has been calculated using the CKD EPI equation.     This calculation has not been validated in all clinical situations.     eGFR's persistently <90 mL/min signify possible Chronic Kidney     Disease.  CBC     Status: Abnormal   Collection Time    01/19/13  5:41 AM      Result Value Range   WBC 18.1 (*) 4.0 -  10.5 K/uL   RBC 2.72 (*) 3.87 - 5.11 MIL/uL   Hemoglobin 8.1 (*) 12.0 - 15.0 g/dL   HCT 23.2 (*) 36.0 - 46.0 %   MCV 85.3  78.0 - 100.0 fL   Comment: DELTA CHECK NOTED   MCH 29.8  26.0 - 34.0 pg   MCHC 34.9  30.0 - 36.0 g/dL   RDW 12.9  11.5 - 15.5 %   Platelets 219  150 - 400 K/uL   Comment: DELTA CHECK NOTED  VITAMIN B12     Status: Abnormal   Collection Time    01/19/13  5:41 AM      Result Value Range   Vitamin B-12 999 (*) 211 - 911 pg/mL   Comment: Performed at [place]     Status: None   Collection Time    01/19/13  5:41 AM      Result Value Range   Folate 11.7     Comment: (NOTE)     Reference Ranges            Deficient:       0.4 - 3.3 ng/mL            Indeterminate:   3.4 -  5.4 ng/mL            Normal:              > 5.4 ng/mL     Performed at [place] TIBC     Status: Abnormal   Collection Time    01/19/13  5:41 AM      Result Value Range   Iron <10 (*) 42 - 135 ug/dL   TIBC Not calculated due to Iron <10.  250 - 470 ug/dL   Saturation Ratios Not calculated due to Iron <10.  20 - 55 %   UIBC 204  125 - 400 ug/dL   Comment: Performed at [place]     Status: None   Collection Time    01/19/13  5:41 AM      Result Value Range   Ferritin 261  10 - 291 ng/mL   Comment: Performed at [place]     Status: Abnormal   Collection Time    01/19/13  5:41 AM      Result Value Range   Retic Ct Pct 2.9  0.4 - 3.1 %   RBC. 2.72 (*) 3.87 - 5.11 MIL/uL   Retic Count, Manual 78.9  19.0 - 186.0 K/uL  GLUCOSE, [facility]     Status: Abnormal   Collection Time    01/19/13  5:44 AM      Result Value Range   Glucose-Capillary 214 (*) 70 - 99 mg/dL  GLUCOSE, [facility]     Status: Abnormal   Collection Time    01/19/13  6:40 AM      Result Value Range   Glucose-Capillary 174 (*) 70 - 99 mg/dL   Comment 1 Notify RN    GLUCOSE, [facility]     Status: Abnormal   Collection Time     01/19/13  7:31 AM      Result Value Range   Glucose-Capillary 132 (*) 70 - 99 mg/dL  GLUCOSE, [facility]     Status: None   Collection Time    01/19/13  8:46 AM      Result Value Range   Glucose-Capillary 77  70 - 99 mg/dL  BASIC METABOLIC PANEL     Status: Abnormal   Collection Time    01/19/13  9:31 AM      Result Value Range   Sodium 143  135 - 145 mEq/L   Potassium 3.8  3.5 - 5.1 mEq/L   Comment: DELTA CHECK NOTED   Chloride 110  96 - 112 mEq/L   CO2 21  19 - 32 mEq/L   Glucose, Bld 85  70 - 99 mg/dL   BUN 37 (*) 6 - 23 mg/dL   Creatinine, Ser 2.71 (*) 0.50 - 1.10 mg/dL   Calcium 8.8  8.4 - 10.5 mg/dL   GFR calc non Af Amer 23 (*) >90 mL/min   GFR calc Af Amer 26 (*) >90 mL/min   Comment: (NOTE)     The eGFR has been calculated using the CKD EPI equation.     This calculation has not been validated in all clinical situations.     eGFR's persistently <90 mL/min signify possible Chronic Kidney     Disease.  GLUCOSE, [facility]     Status: None   Collection Time    01/19/13  9:42 AM      Result Value Range   Glucose-Capillary 90  70 - 99 mg/dL  GLUCOSE,  [facility]     Status: Abnormal   Collection Time    01/19/13 10:44 AM      Result Value Range   Glucose-Capillary 125 (*) 70 - 99 mg/dL  GLUCOSE, [facility]     Status: Abnormal   Collection Time    01/19/13 11:52 AM      Result Value Range   Glucose-Capillary 121 (*) 70 - 99 mg/dL  GLUCOSE, [facility]     Status: Abnormal   Collection Time    01/19/13 12:46 PM      Result Value Range   Glucose-Capillary 131 (*) 70 - 99 mg/dL  BASIC METABOLIC PANEL     Status: Abnormal   Collection Time    01/19/13  1:11 PM      Result Value Range   Sodium 139  135 - 145 mEq/L   Potassium 4.0  3.5 - 5.1 mEq/L   Chloride 108  96 - 112 mEq/L   CO2 21  19 - 32 mEq/L   Glucose, Bld 136 (*) 70 - 99 mg/dL   BUN 35 (*) 6 - 23 mg/dL   Creatinine, Ser 2.66 (*) 0.50 - 1.10 mg/dL   Calcium 8.4  8.4 - 10.5 mg/dL   GFR calc non Af Amer 23  (*) >90 mL/min   GFR calc Af Amer 27 (*) >90 mL/min   Comment: (NOTE)     The eGFR has been calculated using the CKD EPI equation.     This calculation has not been validated in all clinical situations.     eGFR's persistently <90 mL/min signify possible Chronic Kidney     Disease.  GLUCOSE, [facility]     Status: Abnormal   Collection Time    01/19/13  3:48 PM      Result Value Range   Glucose-Capillary 166 (*) 70 - 99 mg/dL  GLUCOSE, [facility]     Status: Abnormal   Collection Time    01/19/13  4:20 PM      Result Value Range   Glucose-Capillary 176 (*) 70 - 99 mg/dL  GLUCOSE, [facility]     Status: Abnormal   Collection Time    01/19/13  9:36 PM      Result Value Range   Glucose-Capillary 236 (*) 70 - 99 mg/dL   Comment 1 Notify RN    GLUCOSE, [facility]     Status: Abnormal   Collection Time    01/20/13  1:59 AM      Result Value Range   Glucose-Capillary 173 (*) 70 - 99 mg/dL   Comment 1 Documented in Chart     Comment 2 Notify RN    CBC     Status: Abnormal   Collection Time    01/20/13  4:37 AM      Result Value Range   WBC 13.5 (*) 4.0 - 10.5 K/uL   RBC 2.76 (*) 3.87 - 5.11 MIL/uL   Hemoglobin 8.0 (*) 12.0 - 15.0 g/dL   HCT 23.9 (*) 36.0 - 46.0 %   MCV 86.6  78.0 - 100.0 fL   MCH 29.0  26.0 - 34.0 pg   MCHC 33.5  30.0 - 36.0 g/dL   RDW 13.4  11.5 - 15.5 %   Platelets 195  150 - 400 K/uL  COMPREHENSIVE METABOLIC PANEL     Status: Abnormal   Collection Time    01/20/13  4:37 AM      Result Value Range   Sodium 138  135 - 145 mEq/L  Potassium 3.5  3.5 - 5.1 mEq/L   Chloride 106  96 - 112 mEq/L   CO2 21  19 - 32 mEq/L   Glucose, Bld 222 (*) 70 - 99 mg/dL   BUN 29 (*) 6 - 23 mg/dL   Creatinine, Ser 2.56 (*) 0.50 - 1.10 mg/dL   Calcium 8.3 (*) 8.4 - 10.5 mg/dL   Total Protein 5.2 (*) 6.0 - 8.3 g/dL   Albumin 2.0 (*) 3.5 - 5.2 g/dL   AST 23  0 - 37 U/L   ALT 18  0 - 35 U/L   Alkaline Phosphatase 96  39 - 117 U/L   Total Bilirubin <0.1 (*) 0.3 - 1.2 mg/dL    GFR calc non Af Amer 24 (*) >90 mL/min   GFR calc Af Amer 28 (*) >90 mL/min   Comment: (NOTE)     The eGFR has been calculated using the CKD EPI equation.     This calculation has not been validated in all clinical situations.     eGFR's persistently <90 mL/min signify possible Chronic Kidney     Disease.  HEMOGLOBIN A1C     Status: Abnormal   Collection Time    01/20/13  4:37 AM      Result Value Range   Hemoglobin A1C 16.8 (*) <5.7 %   Comment: (NOTE)                                                                               According to the ADA Clinical Practice Recommendations for 2011, when     HbA1c is used as a screening test:      >=6.5%   Diagnostic of Diabetes Mellitus               (if abnormal result is confirmed)     5.7-6.4%   Increased risk of developing Diabetes Mellitus     References:Diagnosis and Classification of Diabetes Mellitus,Diabetes     [id number] 1):S62-S69 and Standards of Medical Care in             Diabetes - 2011,Diabetes Care,2011,34 (Suppl 1):S11-S61.   Mean Plasma Glucose 435 (*) <117 mg/dL   Comment: Performed at [place], [place]     Status: Abnormal   Collection Time    01/20/13  7:21 AM      Result Value Range   Glucose-Capillary 241 (*) 70 - 99 mg/dL   Comment 1 Documented in Chart     Comment 2 Notify RN    GLUCOSE, [facility]     Status: Abnormal   Collection Time    01/20/13 11:17 AM      Result Value Range   Glucose-Capillary 185 (*) 70 - 99 mg/dL   Comment 1 Documented in Chart     Comment 2 Notify RN    GLUCOSE, [facility]     Status: Abnormal   Collection Time    01/20/13  4:17 PM      Result Value Range   Glucose-Capillary 111 (*) 70 - 99 mg/dL   Comment 1 Documented in Chart     Comment 2 Notify RN    GLUCOSE, [facility]     Status:  None   Collection Time    01/20/13  8:35 PM      Result Value Range   Glucose-Capillary 92  70 - 99 mg/dL   Comment 1 Notify RN     Comment 2 Documented in  Chart      Lipid Panel No results found for this basename: CHOL, TRIG, HDL, CHOLHDL, VLDL, LDLCALC,  in the last 72 hours  Studies/Results: No results found.  Medications:  Scheduled Meds: . antiseptic oral rinse  15 mL Mouth Rinse BID  . aztreonam  1 g Intravenous Q8H  . heparin  5,000 Units Subcutaneous Q8H  . insulin aspart  0-5 Units Subcutaneous QHS  . insulin aspart  0-9 Units Subcutaneous TID WC  . insulin glargine  28 Units Subcutaneous Daily  . levothyroxine  25 mcg Oral QAC breakfast  . sodium chloride  3 mL Intravenous Q12H  . vancomycin  1,000 mg Intravenous Q24H   Continuous Infusions: . sodium chloride 75 mL/hr at 01/20/13 1051   PRN Meds:.acetaminophen (TYLENOL) oral liquid 160 mg/5 mL, acetaminophen, dextrose, ondansetron (ZOFRAN) IV, ondansetron, phenol, SUMAtriptan      LOS: 2 days   Laketia Vicknair A. Merlene Laughter, M.D.  Diplomate, Tax adviser of Psychiatry and Neurology ( Neurology).

## 2013-01-20 NOTE — Progress Notes (Signed)
Report called and given to Audrea Muscat, RN on 300.  Patient stable at time of transfer to room 301 via [facility] with RN.

## 2013-01-21 LAB — CBC
HCT: 25.7 % — ABNORMAL LOW (ref 36.0–46.0)
MCH: 29.6 pg (ref 26.0–34.0)
MCHC: 33.9 g/dL (ref 30.0–36.0)
MCV: 87.4 fL (ref 78.0–100.0)
RDW: 13.3 % (ref 11.5–15.5)
WBC: 5.8 10*3/uL (ref 4.0–10.5)

## 2013-01-21 LAB — BASIC METABOLIC PANEL
BUN: 21 mg/dL (ref 6–23)
CO2: 22 mEq/L (ref 19–32)
Chloride: 104 mEq/L (ref 96–112)
Creatinine, Ser: 2.02 mg/dL — ABNORMAL HIGH (ref 0.50–1.10)
GFR calc Af Amer: 38 mL/min — ABNORMAL LOW (ref >90)
Glucose, Bld: 165 mg/dL — ABNORMAL HIGH (ref 70–99)
Sodium: 137 mEq/L (ref 135–145)

## 2013-01-21 LAB — GLUCOSE, CAPILLARY
Glucose-Capillary: 151 mg/dL — ABNORMAL HIGH (ref 70–99)
Glucose-Capillary: 207 mg/dL — ABNORMAL HIGH (ref 70–99)
Glucose-Capillary: 152 mg/dL — ABNORMAL HIGH (ref 70–99)

## 2013-01-21 NOTE — Progress Notes (Addendum)
Inpatient Diabetes Program Recommendations  AACE/ADA: New Consensus Statement on Inpatient Glycemic Control (2013)  Target Ranges:  Prepandial:   less than 140 mg/dL      Peak postprandial:   less than 180 mg/dL (1-2 hours)      Critically ill patients:  140 - 180 mg/dL   Results for Tracy [name], Tracy [name] (MRN [id number]) as of 01/21/2013 08:00  Ref. Range 01/20/2013 11:17 01/20/2013 16:17 01/20/2013 20:35 01/21/2013 07:25  Glucose-Capillary Latest Range: 70-99 mg/dL 185 (H) 111 (H) 92 151 (H)   Inpatient Diabetes Program Recommendations Outpatient Diabetic Medications: At time of discharge, recommend Lantus 28 units daily, Humalog 0-9 QID correction scale as used inpatient with sensitive correction scale, and Humalog 1 unit for 15 grams of carbs.  Thanks, Barnie Alderman, RN, MSN, CCRN Diabetes Coordinator Inpatient Diabetes Program [phone] (Team Pager) [phone] ([facility] office) [phone] [facility] office)

## 2013-01-21 NOTE — Progress Notes (Signed)
[name] PROGRESS NOTE  Tracy [name] [id number] DOB: 12/13/1985 DOA: 01/18/2013 PCP: [name] [name] in [place] for diabetes management  Assessment/Plan: 1. Severe diabetic ketoacidosis: Resolved with insulin infusion and supportive care. Provoking factor unclear infection was considered, urine culture negative, blood cultures no growth today, chest x-ray and urinalysis unremarkable. LP studies were unremarkable. Given poor control off pump as well as lack of checking blood sugars at home and markedly elevated hemoglobin A1c, and may be simply secondary to poor compliance. Stable. 2. Acute encephalopathy: resolved. Suspected to be metabolic in nature secondary to severe ketoacidosis, however given her fever and leukocytosis infectious etiology could not be ruled out. CT head was negative. Improved with IV hydration, insulin and empiric antibiotics.Favor noninfectious. Likely stop antibiotics in the next 24 hours pending culture results.  3. SIRS: Resolved. Secondary to DKA, possible underlying infection. 4. Fever, leukocytosis: Resolved. Because of encephalopathy on presentation lumbar puncture was performed in the emergency department. CSF analysis was not consistent with meningitis or encephalitis, acyclovir was stopped. No other apparent source of infection with negative chest x-ray and urinalysis. Continues on vancomycin and aztreonam for now until  CSF culture negative. 5. Headaches: Resolved. Developed in October her psoriasis global headaches, sometimesright side  with blurred vision, nausea, vomiting. Followed by neurology, seen recently by Dr. Gaylyn Cheers to have migraine headaches. Started on topiramate, Zomig nasal spray. Has followup with neurology in 2 weeks. Followup neurology recommendations. MRI brain 01/05/2013 was unremarkable. CT head on admission unremarkable. 6. Diabetes mellitus type 1: With associated diabetic retinopathy, chronic kidney  disease stage III. Hemoglobin A1c 16.1. Long-acting insulin, will need close outpatient followup.  7. Acute renal failure superimposed on Chronic kidney disease stage III: Acute renal failure appears to have resolved.Appears to be near baseline. Continue IV fluids.  8. Acute on chronic normocytic anemia anemia: Suspect secondary to chronic kidney disease with acute component secondary to acute illness and hemodilution. Currently stable. 9. Hypothyroidism: Continue levothyroxine 10. History of HSV-1 and HSV-2 infection - CSF not suggestive of acute meningoencephalitis-therefore acyclovir was discontinued. 11. TSH mildly elevated, followup as an outpatient. Free T4 normal.  Discussed laboratory studies, imaging results, current diagnoses and treatment plan with husband.   Continue current insulin regimen  Continue current antibiotics, plan discharge home tomorrow if CSF culture remains negative.  Microbiology:    blood cultures 12/8--no growth to date  Urine culture 12/8--no growth, final  CSF culture 12/8 --no organisms seen. Culture pending.  Code Status: Full code DVT prophylaxis: heparin Family Communication: as above Disposition Plan: home when improved  Murray Hodgkins, MD  [name]  Pager [phone] If 7PM-7AM, please contact night-coverage at [email or web address], password [facility] 01/21/2013, 2:31 PM  LOS: 3 days   Summary: 27 year old woman with history of diabetes mellitus type 1 with ongoing headaches since October 2014 seen by neurologist 12/8 as an outpatient, diagnosed with migraines, treated with IV medication and prescribed Topamax. She took one dose of Topamax, became confused, had hallucinations. Blood sugar was quite high and she was referred to the emergency department. She was admitted for severe diabetic ketoacidosis with profound hyperglycemia, acute on chronic renal failure with hyperkalemia, leukocytosis, low-grade fever, dehydration,  encephalopathy.  Consultants:  Neurology  Procedures:  Lumbar Puncture in the ED 12/8  Antibiotics: Vancomycin 12/8 >>  Aztreonam 12/8 >>  Acyclovir 12/8>12/9  HPI/Subjective: Feels better. Eating better today. No headache. No new issues.  Objective: Filed Vitals:   01/21/13 0232 01/21/13 0527 01/21/13 1012 01/21/13 1349  BP: 133/80 142/89 135/79 131/84  Pulse: 86 85 86 86  Temp: 98.4 F (36.9 C) 98.4 F (36.9 C) 98 F (36.7 C) 98.9 F (37.2 C)  TempSrc: Oral Oral Oral Oral  Resp: 18 18 18 20   Height:      Weight:      SpO2: 96%  97% 100%    Intake/Output Summary (Last 24 hours) at 01/21/13 1431 Last data filed at 01/21/13 1350  Gross per 24 hour  Intake    480 ml  Output   2300 ml  Net  -1820 ml     Filed Weights   01/18/13 2238 01/19/13 0500 01/20/13 0500  Weight: 65.1 kg (143 lb 8.3 oz) 65.5 kg (144 lb 6.4 oz) 65.3 kg (143 lb 15.4 oz)    Exam:   Afebrile, vital signs stable  General: Appears calm and comfortable.  Cardiovascular: Regular rate and rhythm. No murmur, rub or gallop.  Respiratory: Clear to auscultation bilaterally. No wheezes, rales or rhonchi. Normal respiratory effort.  Data Reviewed:  Capillary blood sugars well-controlled  Creatinine continues to improve, trending downwards, 2.02  Hemoglobin stable 8.7  Scheduled Meds: . antiseptic oral rinse  15 mL Mouth Rinse BID  . aztreonam  1 g Intravenous Q8H  . heparin  5,000 Units Subcutaneous Q8H  . insulin aspart  0-5 Units Subcutaneous QHS  . insulin aspart  0-9 Units Subcutaneous TID WC  . insulin glargine  28 Units Subcutaneous Daily  . levothyroxine  25 mcg Oral QAC breakfast  . sodium chloride  3 mL Intravenous Q12H  . topiramate  25 mg Oral Daily  . vancomycin  1,000 mg Intravenous Q24H   Continuous Infusions: . sodium chloride 75 mL/hr at 01/21/13 0228    Principal Problem:   DKA (diabetic ketoacidoses) Active Problems:   Type I (juvenile type) diabetes  mellitus with ophthalmic manifestations, not stated as uncontrolled(250.51)   Leukocytosis, unspecified   Acute encephalopathy   Hyperkalemia   Normocytic anemia   Acute on chronic renal failure   Time spent 15 minutes

## 2013-01-21 NOTE — Progress Notes (Signed)
Patient ID: Tracy [name], female   DOB: 1985-06-08, 27 y.o.   MRN: [id number]  [place] A. Merlene Laughter, MD     [email or web address]          Tracy [name] is an 27 y.o. female.   Assessment/Plan:  The patient is sleeping but easily arousable. She had breakfast well without any problems today. The patient tolerated the low dose 25 mg of Topamax overnight without any problems. She has no headaches. She actually has resolution of the back pain. Again she should continue with the Topamax for headache prophylaxis. The dose can be increased to 50 mg after week. She is to follow-up with her regular neurologist in [place].    Objective: Vital signs in last 24 hours: Temp:  [98.4 F (36.9 C)-98.8 F (37.1 C)] 98.4 F (36.9 C) (12/11 0527) Pulse Rate:  [85-94] 85 (12/11 0527) Resp:  [16-18] 18 (12/11 0527) BP: (126-142)/(80-89) 142/89 mmHg (12/11 0527) SpO2:  [96 %-100 %] 96 % (12/11 0232)  Intake/Output from previous day: 12/10 0701 - 12/11 0700 In: -  Out: 1000 [Urine:1000] Intake/Output this shift:   Nutritional status: Carb Control   Lab Results: Results for orders placed during the hospital encounter of 01/18/13 (from the past 48 hour(s))  GLUCOSE, CAPILLARY     Status: Abnormal   Collection Time    01/19/13 10:44 AM      Result Value Range   Glucose-Capillary 125 (*) 70 - 99 mg/dL  GLUCOSE, CAPILLARY     Status: Abnormal   Collection Time    01/19/13 11:52 AM      Result Value Range   Glucose-Capillary 121 (*) 70 - 99 mg/dL  GLUCOSE, CAPILLARY     Status: Abnormal   Collection Time    01/19/13 12:46 PM      Result Value Range   Glucose-Capillary 131 (*) 70 - 99 mg/dL  BASIC METABOLIC PANEL     Status: Abnormal   Collection Time    01/19/13  1:11 PM      Result Value Range   Sodium 139  135 - 145 mEq/L   Potassium 4.0  3.5 - 5.1 mEq/L   Chloride 108  96 - 112 mEq/L   CO2 21  19 - 32 mEq/L   Glucose, Bld 136 (*) 70 - 99 mg/dL   BUN  35 (*) 6 - 23 mg/dL   Creatinine, Ser 2.66 (*) 0.50 - 1.10 mg/dL   Calcium 8.4  8.4 - 10.5 mg/dL   GFR calc non Af Amer 23 (*) >90 mL/min   GFR calc Af Amer 27 (*) >90 mL/min   Comment: (NOTE)     The eGFR has been calculated using the CKD EPI equation.     This calculation has not been validated in all clinical situations.     eGFR's persistently <90 mL/min signify possible Chronic Kidney     Disease.  GLUCOSE, CAPILLARY     Status: Abnormal   Collection Time    01/19/13  3:48 PM      Result Value Range   Glucose-Capillary 166 (*) 70 - 99 mg/dL  GLUCOSE, CAPILLARY     Status: Abnormal   Collection Time    01/19/13  4:20 PM      Result Value Range   Glucose-Capillary 176 (*) 70 - 99 mg/dL  GLUCOSE, CAPILLARY     Status: Abnormal   Collection Time    01/19/13  9:36 PM      Result Value  Range   Glucose-Capillary 236 (*) 70 - 99 mg/dL   Comment 1 Notify RN    GLUCOSE, CAPILLARY     Status: Abnormal   Collection Time    01/20/13  1:59 AM      Result Value Range   Glucose-Capillary 173 (*) 70 - 99 mg/dL   Comment 1 Documented in Chart     Comment 2 Notify RN    CBC     Status: Abnormal   Collection Time    01/20/13  4:37 AM      Result Value Range   WBC 13.5 (*) 4.0 - 10.5 K/uL   RBC 2.76 (*) 3.87 - 5.11 MIL/uL   Hemoglobin 8.0 (*) 12.0 - 15.0 g/dL   HCT 23.9 (*) 36.0 - 46.0 %   MCV 86.6  78.0 - 100.0 fL   MCH 29.0  26.0 - 34.0 pg   MCHC 33.5  30.0 - 36.0 g/dL   RDW 13.4  11.5 - 15.5 %   Platelets 195  150 - 400 K/uL  COMPREHENSIVE METABOLIC PANEL     Status: Abnormal   Collection Time    01/20/13  4:37 AM      Result Value Range   Sodium 138  135 - 145 mEq/L   Potassium 3.5  3.5 - 5.1 mEq/L   Chloride 106  96 - 112 mEq/L   CO2 21  19 - 32 mEq/L   Glucose, Bld 222 (*) 70 - 99 mg/dL   BUN 29 (*) 6 - 23 mg/dL   Creatinine, Ser 2.56 (*) 0.50 - 1.10 mg/dL   Calcium 8.3 (*) 8.4 - 10.5 mg/dL   Total Protein 5.2 (*) 6.0 - 8.3 g/dL   Albumin 2.0 (*) 3.5 - 5.2 g/dL   AST  23  0 - 37 U/L   ALT 18  0 - 35 U/L   Alkaline Phosphatase 96  39 - 117 U/L   Total Bilirubin <0.1 (*) 0.3 - 1.2 mg/dL   GFR calc non Af Amer 24 (*) >90 mL/min   GFR calc Af Amer 28 (*) >90 mL/min   Comment: (NOTE)     The eGFR has been calculated using the CKD EPI equation.     This calculation has not been validated in all clinical situations.     eGFR's persistently <90 mL/min signify possible Chronic Kidney     Disease.  HEMOGLOBIN A1C     Status: Abnormal   Collection Time    01/20/13  4:37 AM      Result Value Range   Hemoglobin A1C 16.8 (*) <5.7 %   Comment: (NOTE)                                                                               According to the ADA Clinical Practice Recommendations for 2011, when     HbA1c is used as a screening test:      >=6.5%   Diagnostic of Diabetes Mellitus               (if abnormal result is confirmed)     5.7-6.4%   Increased risk of developing Diabetes Mellitus     References:Diagnosis and  Classification of Diabetes Mellitus,Diabetes     [id number] 1):S62-S69 and Standards of Medical Care in             Diabetes - 2011,Diabetes [id number] (Suppl 1):S11-S61.   Mean Plasma Glucose 435 (*) <117 mg/dL   Comment: Performed at [facility], CAPILLARY     Status: Abnormal   Collection Time    01/20/13  7:21 AM      Result Value Range   Glucose-Capillary 241 (*) 70 - 99 mg/dL   Comment 1 Documented in Chart     Comment 2 Notify RN    GLUCOSE, CAPILLARY     Status: Abnormal   Collection Time    01/20/13 11:17 AM      Result Value Range   Glucose-Capillary 185 (*) 70 - 99 mg/dL   Comment 1 Documented in Chart     Comment 2 Notify RN    GLUCOSE, CAPILLARY     Status: Abnormal   Collection Time    01/20/13  4:17 PM      Result Value Range   Glucose-Capillary 111 (*) 70 - 99 mg/dL   Comment 1 Documented in Chart     Comment 2 Notify RN    GLUCOSE, CAPILLARY     Status: None   Collection Time    01/20/13   8:35 PM      Result Value Range   Glucose-Capillary 92  70 - 99 mg/dL   Comment 1 Notify RN     Comment 2 Documented in Chart    GLUCOSE, CAPILLARY     Status: Abnormal   Collection Time    01/21/13  7:25 AM      Result Value Range   Glucose-Capillary 151 (*) 70 - 99 mg/dL   Comment 1 Notify RN    BASIC METABOLIC PANEL     Status: Abnormal   Collection Time    01/21/13  8:06 AM      Result Value Range   Sodium 137  135 - 145 mEq/L   Potassium 3.6  3.5 - 5.1 mEq/L   Chloride 104  96 - 112 mEq/L   CO2 22  19 - 32 mEq/L   Glucose, Bld 165 (*) 70 - 99 mg/dL   BUN 21  6 - 23 mg/dL   Creatinine, Ser 2.02 (*) 0.50 - 1.10 mg/dL   Calcium 8.4  8.4 - 10.5 mg/dL   GFR calc non Af Amer 32 (*) >90 mL/min   GFR calc Af Amer 38 (*) >90 mL/min   Comment: (NOTE)     The eGFR has been calculated using the CKD EPI equation.     This calculation has not been validated in all clinical situations.     eGFR's persistently <90 mL/min signify possible Chronic Kidney     Disease.  CBC     Status: Abnormal   Collection Time    01/21/13  8:06 AM      Result Value Range   WBC 5.8  4.0 - 10.5 K/uL   RBC 2.94 (*) 3.87 - 5.11 MIL/uL   Hemoglobin 8.7 (*) 12.0 - 15.0 g/dL   HCT 25.7 (*) 36.0 - 46.0 %   MCV 87.4  78.0 - 100.0 fL   MCH 29.6  26.0 - 34.0 pg   MCHC 33.9  30.0 - 36.0 g/dL   RDW 13.3  11.5 - 15.5 %   Platelets 173  150 - 400 K/uL    Lipid Panel No results  found for this basename: CHOL, TRIG, HDL, CHOLHDL, VLDL, LDLCALC,  in the last 72 hours  Studies/Results: No results found.  Medications:  Scheduled Meds: . antiseptic oral rinse  15 mL Mouth Rinse BID  . aztreonam  1 g Intravenous Q8H  . heparin  5,000 Units Subcutaneous Q8H  . insulin aspart  0-5 Units Subcutaneous QHS  . insulin aspart  0-9 Units Subcutaneous TID WC  . insulin glargine  28 Units Subcutaneous Daily  . levothyroxine  25 mcg Oral QAC breakfast  . sodium chloride  3 mL Intravenous Q12H  . topiramate  25 mg Oral  Daily  . vancomycin  1,000 mg Intravenous Q24H   Continuous Infusions: . sodium chloride 75 mL/hr at 01/21/13 0228   PRN Meds:.acetaminophen (TYLENOL) oral liquid 160 mg/5 mL, acetaminophen, dextrose, ondansetron (ZOFRAN) IV, ondansetron, phenol, SUMAtriptan     LOS: 3 days   Aritza [facility] A. Merlene Laughter, M.D.  Diplomate, Tax adviser of Psychiatry and Neurology ( Neurology).

## 2013-01-22 LAB — BASIC METABOLIC PANEL
BUN: 20 mg/dL (ref 6–23)
CO2: 23 mEq/L (ref 19–32)
Calcium: 8.4 mg/dL (ref 8.4–10.5)
Chloride: 106 mEq/L (ref 96–112)
Creatinine, Ser: 1.95 mg/dL — ABNORMAL HIGH (ref 0.50–1.10)
Glucose, Bld: 242 mg/dL — ABNORMAL HIGH (ref 70–99)

## 2013-01-22 LAB — GLUCOSE, CAPILLARY
Glucose-Capillary: 249 mg/dL — ABNORMAL HIGH (ref 70–99)
Glucose-Capillary: 259 mg/dL — ABNORMAL HIGH (ref 70–99)

## 2013-01-22 LAB — CSF CULTURE W GRAM STAIN: Culture: NO GROWTH

## 2013-01-22 MED ORDER — INSULIN GLARGINE 100 UNIT/ML SOLOSTAR PEN: 28.0000 [IU] | PEN_INJECTOR | Freq: Every day | SUBCUTANEOUS | Status: DC

## 2013-01-22 NOTE — Progress Notes (Signed)
[name] PROGRESS NOTE  [name] [id number] DOB: 07-Sep-1985 DOA: 01/18/2013 PCP: [name] [name] in [place] for diabetes management  Assessment/Plan: 1. Severe diabetic ketoacidosis: Resolved with insulin infusion and supportive care. Provoking factor unclear infection was considered, urine culture negative, blood cultures no growth today, CSF culture negative, chest x-ray and urinalysis unremarkable. LP studies were unremarkable. Given poor control off pump as well as lack of checking blood sugars at home and markedly elevated hemoglobin A1c, and may be simply secondary to poor compliance. Stable. 2. Acute encephalopathy: resolved. Suspected to be metabolic in nature secondary to severe ketoacidosis, however given her fever and leukocytosis infectious etiology could not be ruled out. CT head was negative. Improved with IV hydration, insulin and empiric antibiotics. Favor noninfectious. 3. SIRS: Resolved. Secondary to DKA. No evidence of infection. 4. Fever, leukocytosis: Resolved. Because of encephalopathy on presentation lumbar puncture was performed in the emergency department. CSF analysis was not consistent with meningitis or encephalitis, acyclovir was stopped. No other apparent source of infection with negative chest x-ray and urinalysis. CSF culture is negative. 5. Headaches: Resolved. Developed in October her psoriasis global headaches, sometimesright side  with blurred vision, nausea, vomiting. Followed by neurology, seen recently by Dr. Gaylyn Cheers to have migraine headaches. Started on topiramate, Zomig nasal spray. Has followup with neurology in 2 weeks. Followup neurology recommendations. MRI brain 01/05/2013 was unremarkable. CT head on admission unremarkable. She does not want to take Topamax. 6. Diabetes mellitus type 1: With associated diabetic retinopathy, chronic kidney disease stage III. Hemoglobin A1c 16.1. She would like to see Dr.  Dorris Fetch and requests referral.  7. Acute renal failure superimposed on chronic kidney disease stage III: Acute renal failure appears to have resolved.Appears to be near baseline.  8. Acute on chronic normocytic anemia anemia: Suspect secondary to chronic kidney disease with acute component secondary to acute illness and hemodilution. Stable. 9. Hypothyroidism: Continue levothyroxine. TSH mildly elevated, followup as an outpatient. Free T4 normal. 10. History of HSV-1 and HSV-2 infection - CSF not suggestive of acute meningoencephalitis   Home today  Follow-up with Dr. Dorris Fetch 12/16 at 10:30 AM  Continue Lantus 28 units daily, continue meal coverage as previous.   Microbiology:   Blood cultures 12/8--no growth to date  Urine culture 12/8--no growth, final  CSF culture 12/8 --no growth, final.  Code Status: Full code DVT prophylaxis: heparin Family Communication: as above Disposition Plan: home when improved  Murray Hodgkins, MD  [name]  Pager [phone] If 7PM-7AM, please contact night-coverage at [email or web address], password [facility] 01/22/2013, 11:52 AM  LOS: 4 days   Summary: 27 year old woman with history of diabetes mellitus type 1 with ongoing headaches since October 2014 seen by neurologist 12/8 as an outpatient, diagnosed with migraines, treated with IV medication and prescribed Topamax. She took one dose of Topamax, became confused, had hallucinations. Blood sugar was quite high and she was referred to the emergency department. She was admitted for severe diabetic ketoacidosis with profound hyperglycemia, acute on chronic renal failure with hyperkalemia, leukocytosis, low-grade fever, dehydration, encephalopathy.  Consultants:  Neurology  Procedures:  Lumbar Puncture in the ED 12/8  Antibiotics: Vancomycin 12/8 >> 12/12 Aztreonam 12/8 >> 12/12 Acyclovir 12/8>12/9  HPI/Subjective: Feels good. No complaints. No HA.  Objective: Filed Vitals:   01/21/13 1840  01/21/13 2139 01/22/13 0155 01/22/13 0608  BP: 148/96 137/90 138/92 142/87  Pulse: 86 88 79 85  Temp: 97.8 F (36.6 C) 98.5 F (36.9 C) 98 F (36.7 C) 98.3 F (  36.8 C)  TempSrc: Oral Oral Oral Oral  Resp: 20 20 20 18   Height:      Weight:      SpO2: 100% 100% 100% 100%    Intake/Output Summary (Last 24 hours) at 01/22/13 1152 Last data filed at 01/22/13 0902  Gross per 24 hour  Intake 4034.58 ml  Output   2000 ml  Net 2034.58 ml     Filed Weights   01/18/13 2238 01/19/13 0500 01/20/13 0500  Weight: 65.1 kg (143 lb 8.3 oz) 65.5 kg (144 lb 6.4 oz) 65.3 kg (143 lb 15.4 oz)    Exam:   Afebrile, vital signs stable  General: Well-appearing.  Cardiovascular: Regular rate and rhythm. No murmur, rub or gallop.  Respiratory: Clear to auscultation bilaterally. No wheezes, rales or rhonchi. Normal respiratory effort.  Psychiatric: Grossly normal mood and affect. Speech fluent and appropriate  Data Reviewed:  Capillary blood sugars stable.  Creatinine continues to improve, trending downwards, 1.95.  Scheduled Meds: . antiseptic oral rinse  15 mL Mouth Rinse BID  . aztreonam  1 g Intravenous Q8H  . heparin  5,000 Units Subcutaneous Q8H  . insulin aspart  0-5 Units Subcutaneous QHS  . insulin aspart  0-9 Units Subcutaneous TID WC  . insulin glargine  28 Units Subcutaneous Daily  . levothyroxine  25 mcg Oral QAC breakfast  . sodium chloride  3 mL Intravenous Q12H  . topiramate  25 mg Oral Daily  . vancomycin  1,000 mg Intravenous Q24H   Continuous Infusions: . sodium chloride 50 mL/hr at 01/21/13 1534    Principal Problem:   DKA (diabetic ketoacidoses) Active Problems:   Type I (juvenile type) diabetes mellitus with ophthalmic manifestations, not stated as uncontrolled(250.51)   Leukocytosis, unspecified   Acute encephalopathy   Hyperkalemia   Normocytic anemia   Acute on chronic renal failure

## 2013-01-22 NOTE — Progress Notes (Signed)
Patient being d/c home with instructions. Verbalizes understanding. IV cath removed and intact. No pain/swelling at site.

## 2013-01-22 NOTE — Discharge Summary (Signed)
Physician Discharge Summary  Tracy [name] [id number] DOB: 1985-09-29 DOA: 01/18/2013  PCP: [name]  Admit date: 01/18/2013 Discharge date: 01/22/2013  Recommendations for Outpatient Follow-up:  1. Diabetes mellitus, severe diabetic ketoacidosis, adjust insulin as indicated 2. Presumed migraine headaches 3. Chronic kidney disease, suspect stage II-3 4. Normocytic anemia, consider repeat CBC in followup 5. Mildly elevated TSH during this admission, followup as clinically indicated   Follow-up Information   Follow up with JONES,ERIN, PA-C In 2 weeks.   Specialty:  Family Medicine   Contact information:   [address] [place] [address] [phone]       Follow up with NIDA,GEBRESELASSIE, MD On 01/26/2013. (10:30 AM)    Specialty:  Endocrinology   Contact information:   [place] [address] [phone]      Discharge Diagnoses:  1. Severe diabetic ketoacidosis 2. Acute encephalopathy 3. SIRS 4. Fever, leukocytosis 5. Presumed migraine headaches 6. Diabetes mellitus type 1, uncontrolled with hemoglobin A1c 16.1 7. Acute renal failure superimposed on chronic kidney disease stage III 8. Acute on chronic normocytic anemia, likely chronic disease 9. Hypothyroidism  Discharge Condition: improved Disposition: Home  Diet recommendation: diabetic diet  Filed Weights   01/18/13 2238 01/19/13 0500 01/20/13 0500  Weight: 65.1 kg (143 lb 8.3 oz) 65.5 kg (144 lb 6.4 oz) 65.3 kg (143 lb 15.4 oz)    History of present illness:  27 year old woman with history of diabetes mellitus type 1 with ongoing headaches since October 2014 seen by neurologist 12/8 as an outpatient, diagnosed with migraines, treated with IV medication and prescribed Topamax. She took one dose of Topamax, became confused, had hallucinations. Blood sugar was quite high and she was referred to the emergency department. She was admitted for severe diabetic ketoacidosis with profound  hyperglycemia, acute on chronic renal failure with hyperkalemia, leukocytosis, low-grade fever, dehydration, encephalopathy.  Hospital Course:  She gradually improved with treatment of severe hyperglycemia and DKA with resolution of encephalopathy, acidosis and leukocytosis. Because of low-grade fever a concern for infection prompted lumbar puncture in the emergency department. However there is no evidence to suggest meningitis or encephalitis and cultures have been negative. Blood sugars have remained stable and she is now stable for discharge. See individual issues below.  1. Severe diabetic ketoacidosis: Resolved with insulin infusion and supportive care. Provoking factor unclear infection was considered, urine culture negative, blood cultures no growth today, CSF culture negative, chest x-ray and urinalysis unremarkable. LP studies were unremarkable. Given poor control off pump as well as lack of checking blood sugars at home and markedly elevated hemoglobin A1c, and may be simply secondary to poor compliance. Stable. 2. Acute encephalopathy: resolved. Suspected to be metabolic in nature secondary to severe ketoacidosis, however given her fever and leukocytosis infectious etiology could not be ruled out. CT head was negative. Improved with IV hydration, insulin and empiric antibiotics. Favor noninfectious. 3. SIRS: Resolved. Secondary to DKA. No evidence of infection. 4. Fever, leukocytosis: Resolved. Because of encephalopathy on presentation lumbar puncture was performed in the emergency department. CSF analysis was not consistent with meningitis or encephalitis, acyclovir was stopped. No other apparent source of infection with negative chest x-ray and urinalysis. CSF culture is negative. 5. Headaches: Resolved. Developed in October her psoriasis global headaches, sometimesright side with blurred vision, nausea, vomiting. Followed by neurology, seen recently by Dr. Gaylyn Cheers to have migraine  headaches. Started on topiramate, Zomig nasal spray. Has followup with neurology in 2 weeks. Followup neurology recommendations. MRI brain  01/05/2013 was unremarkable. CT head on admission unremarkable. She does not want to take Topamax. 6. Diabetes mellitus type 1: With associated diabetic retinopathy, chronic kidney disease stage III. Hemoglobin A1c 16.1. She would like to see Dr. Dorris Fetch and requests referral.  7. Acute renal failure superimposed on chronic kidney disease stage III: Acute renal failure appears to have resolved.Appears to be near baseline.  8. Acute on chronic normocytic anemia: Suspect secondary to chronic kidney disease with acute component secondary to acute illness and hemodilution. Stable. 9. Hypothyroidism: Continue levothyroxine. TSH mildly elevated, followup as an outpatient. Free T4 normal. 10. History of HSV-1 and HSV-2 infection - CSF not suggestive of acute meningoencephalitis Home today  Follow-up with Dr. Dorris Fetch 12/16 at 10:30 AM  Continue Lantus 28 units daily, continue meal coverage as previous--blood sugars have been adequately controlled on Lantus at this dosage.  Discharge Instructions  Discharge Orders   Future Appointments Provider Department Dept Phone   03/31/2013 3:30 PM [place] [facility] [phone]   Future Orders Complete By Expires   Activity as tolerated - No restrictions  As directed    Diet Carb Modified  As directed    Discharge instructions  As directed    Comments:     Check blood sugars 4 times per day, keep a logbook, bring to all appointments. Call your physician or seek immediate medical attention for persistent blood sugars greater than 400 or less than 70.       Medication List    STOP taking these medications       topiramate 50 MG tablet  Commonly known as:  TOPAMAX      TAKE these medications       cyclobenzaprine 10 MG tablet  Commonly known as:  FLEXERIL  Take 1 tablet by mouth at bedtime as needed  and may repeat dose one time if needed.     HUMALOG KWIKPEN [place]  Inject 5-10 Units into the skin 3 (three) times daily before meals. 3 times a day (just before each meal) 11-15-08 units, and pen needles 4/day     ibuprofen 200 MG tablet  Commonly known as:  ADVIL,MOTRIN  Take 200 mg by mouth every 6 (six) hours as needed.     Insulin Glargine 100 UNIT/ML Sopn  Commonly known as:  LANTUS SOLOSTAR  Inject 28 Units into the skin at bedtime.     levothyroxine 25 MCG tablet  Commonly known as:  SYNTHROID, LEVOTHROID  Take 25 mcg by mouth daily before breakfast.     medroxyPROGESTERone 150 MG/ML injection  Commonly known as:  DEPO-PROVERA  Inject 150 mg into the muscle every 3 (three) months.     ondansetron 8 MG disintegrating tablet  Commonly known as:  ZOFRAN-ODT  Take 8 mg by mouth at bedtime as needed for nausea.     zolmitriptan 5 MG nasal solution  Commonly known as:  ZOMIG  Place 1 spray into the nose as needed for migraine. (may repeat x1 after 2 hours)       Allergies  Allergen Reactions  . Penicillins Hives and Swelling  . Sulfa Antibiotics Hives    The results of significant diagnostics from this hospitalization (including imaging, microbiology, ancillary and laboratory) are listed below for reference.    Significant Diagnostic Studies: Ct Head Wo Contrast  01/18/2013   CLINICAL DATA:  Hyperglycemia.  Confusion.  Diabetes.  EXAM: CT HEAD WITHOUT CONTRAST  TECHNIQUE: Contiguous axial images were obtained from the base of the  skull through the vertex without intravenous contrast.  COMPARISON:  MRI 01/05/2013.  FINDINGS: No mass lesion, mass effect, midline shift, hydrocephalus, hemorrhage. No territorial ischemia or acute infarction. Paranasal sinuses appear within normal limits.  IMPRESSION: Negative CT brain.   Electronically Signed   By: Dereck Ligas M.D.   On: 01/18/2013 20:21   Dg Chest Portable 1 View  01/18/2013   CLINICAL DATA:  Hyperglycemia, diabetes,  altered mental status  EXAM: PORTABLE CHEST - 1 VIEW  COMPARISON:  10/01/2011  FINDINGS: The heart size and mediastinal contours are within normal limits. Both lungs are clear. The visualized skeletal structures are unremarkable.  IMPRESSION: No active disease.   Electronically Signed   By: Daryll Brod M.D.   On: 01/18/2013 18:54    Microbiology: Recent Results (from the past 240 hour(s))  URINE CULTURE     Status: None   Collection Time    01/18/13  6:57 PM      Result Value Range Status   Specimen Description URINE, CLEAN CATCH   Final   Special Requests NONE   Final   Culture  Setup Time     Final   Value: 01/19/2013 01:49     Performed at [facility]     Final   Value: NO GROWTH     Performed at [facility]   Culture     Final   Value: NO GROWTH     Performed at [facility]   Report Status 01/20/2013 FINAL   Final  CULTURE, BLOOD (ROUTINE X 2)     Status: None   Collection Time    01/18/13  7:12 PM      Result Value Range Status   Specimen Description BLOOD LEFT ANTECUBITAL   Final   Special Requests BOTTLES DRAWN AEROBIC AND ANAEROBIC 8CC   Final   Culture NO GROWTH 3 DAYS   Final   Report Status PENDING   Incomplete  CULTURE, BLOOD (ROUTINE X 2)     Status: None   Collection Time    01/18/13  7:20 PM      Result Value Range Status   Specimen Description BRONCHIAL WASHINGS   Final   Special Requests BOTTLES DRAWN AEROBIC ONLY 5CC   Final   Culture NO GROWTH 3 DAYS   Final   Report Status PENDING   Incomplete  CSF CULTURE     Status: None   Collection Time    01/18/13  9:18 PM      Result Value Range Status   Specimen Description CSF   Final   Special Requests NONE   Final   Gram Stain     Final   Value: CYTOSPIN SLIDE NO WBC SEEN     NO ORGANISMS SEEN     Performed at [facility]     Performed at [facility]   Culture     Final   Value: NO GROWTH 3 DAYS     Performed at [facility]   Report  Status 01/22/2013 FINAL   Final  MRSA PCR SCREENING     Status: None   Collection Time    01/18/13 10:39 PM      Result Value Range Status   MRSA by PCR NEGATIVE  NEGATIVE Final   Comment:            The GeneXpert MRSA Assay (FDA     approved for NASAL specimens  only), is one component of a     comprehensive MRSA colonization     surveillance program. It is not     intended to diagnose MRSA     infection nor to guide or     monitor treatment for     MRSA infections.     Labs: Basic Metabolic Panel:  Recent Labs Lab 01/19/13 0931 01/19/13 1311 01/20/13 0437 01/21/13 0806 01/22/13 0509  NA 143 139 138 137 137  K 3.8 4.0 3.5 3.6 4.1  CL 110 108 106 104 106  CO2 21 21 21 22 23   GLUCOSE 85 136* 222* 165* 242*  BUN 37* 35* 29* 21 20  CREATININE 2.71* 2.66* 2.56* 2.02* 1.95*  CALCIUM 8.8 8.4 8.3* 8.4 8.4   Liver Function Tests:  Recent Labs Lab 01/18/13 1106 01/20/13 0437  AST 31 23  ALT 29 18  ALKPHOS 140* 96  BILITOT 0.3 <0.1*  PROT 6.3 5.2*  ALBUMIN  --  2.0*    Recent Labs Lab 01/18/13 1912  AMMONIA 19   CBC:  Recent Labs Lab 01/18/13 1106 01/18/13 1830 01/19/13 0541 01/20/13 0437 01/21/13 0806  WBC 20.0* 29.1* 18.1* 13.5* 5.8  NEUTROABS 18.0* 25.9*  --   --   --   HGB 10.9* 9.7* 8.1* 8.0* 8.7*  HCT 34.2 32.8* 23.2* 23.9* 25.7*  MCV 92 99.7 85.3 86.6 87.4  PLT 264 361 219 195 173   Cardiac Enzymes:  Recent Labs Lab 01/18/13 1830  CKTOTAL 50   CBG:  Recent Labs Lab 01/21/13 1117 01/21/13 1651 01/21/13 2143 01/22/13 0734 01/22/13 1113  GLUCAP 180* 207* 152* 249* 259*    Principal Problem:   DKA (diabetic ketoacidoses) Active Problems:   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

## 2013-01-22 NOTE — Progress Notes (Signed)
UR chart review completed.  

## 2013-01-22 NOTE — Progress Notes (Signed)
ANTIBIOTIC CONSULT NOTE - INITIAL  Pharmacy Consult for Vancomycin & Aztreonam Indication: rule out meningitis  Allergies  Allergen Reactions  . Penicillins Hives and Swelling  . Sulfa Antibiotics Hives    Patient Measurements: Height: 5\' 3"  (160 cm) Weight: 143 lb 15.4 oz (65.3 kg) IBW/kg (Calculated) : 52.4  Vital Signs: Temp: 98.3 F (36.8 C) (12/12 0608) Temp src: Oral (12/12 0608) BP: 142/87 mmHg (12/12 0608) Pulse Rate: 85 (12/12 0608) Intake/Output from previous day: 12/11 0701 - 12/12 0700 In: 4034.6 [P.O.:720; I.V.:2864.6; IV Piggyback:450] Out: 2800 [Urine:2800] Intake/Output from this shift: Total I/O In: 240 [P.O.:240] Out: -   Labs:  Recent Labs  01/20/13 0437 01/21/13 0806 01/22/13 0509  WBC 13.5* 5.8  --   HGB 8.0* 8.7*  --   PLT 195 173  --   CREATININE 2.56* 2.02* 1.95*   Estimated Creatinine Clearance: 39.1 ml/min (by C-G formula based on Cr of 1.95). No results found for this basename: VANCOTROUGH, Corlis Leak, VANCORANDOM, [place], GENTPEAK, GENTRANDOM, [place], TOBRAPEAK, TOBRARND, AMIKACINPEAK, AMIKACINTROU, AMIKACIN,  in the last 72 hours   Microbiology: Recent Results (from the past 720 hour(s))  URINE CULTURE     Status: None   Collection Time    01/18/13  6:57 PM      Result Value Range Status   Specimen Description URINE, CLEAN CATCH   Final   Special Requests NONE   Final   Culture  Setup Time     Final   Value: 01/19/2013 01:49     Performed at [place]     Final   Value: NO GROWTH     Performed at [facility]   Culture     Final   Value: NO GROWTH     Performed at [facility]   Report Status 01/20/2013 FINAL   Final  CULTURE, BLOOD (ROUTINE X 2)     Status: None   Collection Time    01/18/13  7:12 PM      Result Value Range Status   Specimen Description BLOOD LEFT ANTECUBITAL   Final   Special Requests BOTTLES DRAWN AEROBIC AND ANAEROBIC [place]   Final   Culture NO GROWTH  3 DAYS   Final   Report Status PENDING   Incomplete  CULTURE, BLOOD (ROUTINE X 2)     Status: None   Collection Time    01/18/13  7:20 PM      Result Value Range Status   Specimen Description BRONCHIAL WASHINGS   Final   Special Requests BOTTLES DRAWN AEROBIC ONLY 5CC   Final   Culture NO GROWTH 3 DAYS   Final   Report Status PENDING   Incomplete  CSF CULTURE     Status: None   Collection Time    01/18/13  9:18 PM      Result Value Range Status   Specimen Description CSF   Final   Special Requests NONE   Final   Gram Stain     Final   Value: CYTOSPIN SLIDE NO WBC SEEN     NO ORGANISMS SEEN     Performed at [facility]     Performed at [facility]   Culture     Final   Value: NO GROWTH 2 DAYS     Performed at [facility]   Report Status PENDING   Incomplete  MRSA PCR SCREENING     Status: None   Collection Time  01/18/13 10:39 PM      Result Value Range Status   MRSA by PCR NEGATIVE  NEGATIVE Final   Comment:            The GeneXpert MRSA Assay (FDA     approved for NASAL specimens     only), is one component of a     comprehensive MRSA colonization     surveillance program. It is not     intended to diagnose MRSA     infection nor to guide or     monitor treatment for     MRSA infections.    Medical History: Past Medical History  Diagnosis Date  . Thyroid enlargement     "not on medication at this time"  . Urinary tract infection     hx of  . Anemia     presently on iron supplement  . Diabetes mellitus     insulin pump   . Hypothyroidism   . Anxiety   . HSV-2 (herpes simplex virus 2) infection   . HSV-1 (herpes simplex virus 1) infection   . Detached retina   . Yeast infection     took diflucan saturday     Medications:  Scheduled:  . antiseptic oral rinse  15 mL Mouth Rinse BID  . aztreonam  1 g Intravenous Q8H  . heparin  5,000 Units Subcutaneous Q8H  . insulin aspart  0-5 Units Subcutaneous QHS  . insulin aspart  0-9  Units Subcutaneous TID WC  . insulin glargine  28 Units Subcutaneous Daily  . levothyroxine  25 mcg Oral QAC breakfast  . sodium chloride  3 mL Intravenous Q12H  . topiramate  25 mg Oral Daily  . vancomycin  1,000 mg Intravenous Q24H   Assessment: 27 yo F who presents with migraines, AMS, DKA, and febrile illness.  She was started on empiric antibiotics to r/o meningitis.   WBC has normalized.  CSF cx NGTD. Chronic renal function has been stable/ slightly improved from admission.   Aztreonam 12/9>> Vancomycin 12/8>> Acyclovir 12/8 x1 dose  Goal of Therapy:  Vancomycin trough level 15-20 mcg/ml  Plan:  Aztreonam 1gm IV q8h Vancomycin 1gm IV q24h Check Vancomycin trough at steady state -Defer trough for now since anticipate drug will be stopped & patient discharged in next 24 hrs.  Monitor renal function and cx data.   Biagio Borg 01/22/2013,10:58 AM

## 2013-01-23 LAB — CULTURE, BLOOD (ROUTINE X 2): Culture: NO GROWTH

## 2013-02-18 ENCOUNTER — Other Ambulatory Visit (HOSPITAL_COMMUNITY): Payer: Self-pay | Admitting: Family Medicine

## 2013-02-18 DIAGNOSIS — R0989 Other specified symptoms and signs involving the circulatory and respiratory systems: Secondary | ICD-10-CM

## 2013-02-19 ENCOUNTER — Other Ambulatory Visit: Payer: Self-pay | Admitting: Urology

## 2013-02-23 ENCOUNTER — Ambulatory Visit (HOSPITAL_COMMUNITY): Payer: BC Managed Care – PPO

## 2013-03-03 ENCOUNTER — Ambulatory Visit (HOSPITAL_COMMUNITY)
Admission: RE | Admit: 2013-03-03 | Discharge: 2013-03-03 | Disposition: A | Discharge status: Home / Self Care | Payer: BC Managed Care – PPO | Source: Ambulatory Visit | Attending: Family Medicine | Admitting: Family Medicine

## 2013-03-03 ENCOUNTER — Other Ambulatory Visit (HOSPITAL_COMMUNITY): Payer: Self-pay | Admitting: Family Medicine

## 2013-03-03 DIAGNOSIS — R0989 Other specified symptoms and signs involving the circulatory and respiratory systems: Secondary | ICD-10-CM | POA: Insufficient documentation

## 2013-03-03 DIAGNOSIS — I6529 Occlusion and stenosis of unspecified carotid artery: Secondary | ICD-10-CM | POA: Insufficient documentation

## 2013-03-11 ENCOUNTER — Encounter: Payer: Self-pay | Admitting: Nephrology

## 2013-03-11 ENCOUNTER — Encounter: Payer: Self-pay | Admitting: *Deleted

## 2013-03-30 ENCOUNTER — Ambulatory Visit: Payer: BC Managed Care – PPO | Admitting: Urology

## 2013-03-31 ENCOUNTER — Ambulatory Visit: Payer: BC Managed Care – PPO

## 2013-04-01 ENCOUNTER — Ambulatory Visit (INDEPENDENT_AMBULATORY_CARE_PROVIDER_SITE_OTHER): Payer: BC Managed Care – PPO | Admitting: Adult Health

## 2013-04-01 ENCOUNTER — Encounter (INDEPENDENT_AMBULATORY_CARE_PROVIDER_SITE_OTHER): Payer: Self-pay

## 2013-04-01 ENCOUNTER — Encounter: Payer: Self-pay | Admitting: Adult Health

## 2013-04-01 VITALS — BP 140/80 | Ht 63.0 in | Wt 152.6 lb

## 2013-04-01 DIAGNOSIS — Z3049 Encounter for surveillance of other contraceptives: Secondary | ICD-10-CM

## 2013-04-01 DIAGNOSIS — Z309 Encounter for contraceptive management, unspecified: Secondary | ICD-10-CM

## 2013-04-01 DIAGNOSIS — Z3202 Encounter for pregnancy test, result negative: Secondary | ICD-10-CM

## 2013-04-01 LAB — POCT URINE PREGNANCY: PREG TEST UR: NEGATIVE

## 2013-04-01 MED ORDER — MEDROXYPROGESTERONE ACETATE 150 MG/ML IM SUSP
150.0000 mg | Freq: Once | INTRAMUSCULAR | Status: AC
  Administered 2013-04-01: 150 mg via INTRAMUSCULAR

## 2013-04-01 NOTE — Progress Notes (Signed)
Patient ID: Tracy [name], female   DOB: [birth year], 28 y.o.   MRN: [id number] Depo provera 150 mg IM given left deltoid with no complications, resulted negative pregnancy test. Pt to return in 12 weeks for next injection.

## 2013-06-23 ENCOUNTER — Other Ambulatory Visit: Payer: Self-pay | Admitting: Adult Health

## 2013-06-24 ENCOUNTER — Ambulatory Visit: Payer: BC Managed Care – PPO

## 2013-06-25 ENCOUNTER — Ambulatory Visit (INDEPENDENT_AMBULATORY_CARE_PROVIDER_SITE_OTHER): Payer: BC Managed Care – PPO | Admitting: Obstetrics & Gynecology

## 2013-06-25 ENCOUNTER — Encounter: Payer: Self-pay | Admitting: Obstetrics & Gynecology

## 2013-06-25 VITALS — BP 160/84 | Ht 63.0 in | Wt 140.0 lb

## 2013-06-25 DIAGNOSIS — Z3202 Encounter for pregnancy test, result negative: Secondary | ICD-10-CM

## 2013-06-25 DIAGNOSIS — Z309 Encounter for contraceptive management, unspecified: Secondary | ICD-10-CM

## 2013-06-25 DIAGNOSIS — Z3049 Encounter for surveillance of other contraceptives: Secondary | ICD-10-CM

## 2013-06-25 LAB — POCT URINE PREGNANCY: Preg Test, Ur: NEGATIVE

## 2013-06-25 MED ORDER — MEDROXYPROGESTERONE ACETATE 150 MG/ML IM SUSP
150.0000 mg | Freq: Once | INTRAMUSCULAR | Status: AC
  Administered 2013-06-25: 150 mg via INTRAMUSCULAR

## 2013-06-25 NOTE — Progress Notes (Signed)
Pt here for Depo shot. Pt states her BP med was changed 1-2 weeks ago, not sure of name. Pt BP here in office was 160/84. Pt reports having headaches off and on. I advised to call the dr that prescribed med and give them a heads up on what BP was today and that she is having headaches. Pt voiced understanding. [place]

## 2013-09-01 ENCOUNTER — Telehealth: Payer: Self-pay

## 2013-09-01 ENCOUNTER — Other Ambulatory Visit: Payer: Self-pay | Admitting: Adult Health

## 2013-09-01 NOTE — Telephone Encounter (Signed)
LMOVM to call back and schedule follow up appointment to have A1C and LDL checked, also mailed out letter.

## 2013-09-17 ENCOUNTER — Ambulatory Visit: Payer: BC Managed Care – PPO

## 2013-09-21 HISTORY — PX: TRIGGER FINGER RELEASE: SHX641

## 2013-09-22 ENCOUNTER — Ambulatory Visit (INDEPENDENT_AMBULATORY_CARE_PROVIDER_SITE_OTHER): Payer: BC Managed Care – PPO | Admitting: Adult Health

## 2013-09-22 ENCOUNTER — Encounter: Payer: Self-pay | Admitting: Adult Health

## 2013-09-22 DIAGNOSIS — Z3202 Encounter for pregnancy test, result negative: Secondary | ICD-10-CM

## 2013-09-22 DIAGNOSIS — Z3042 Encounter for surveillance of injectable contraceptive: Secondary | ICD-10-CM

## 2013-09-22 DIAGNOSIS — Z3049 Encounter for surveillance of other contraceptives: Secondary | ICD-10-CM

## 2013-09-22 LAB — POCT URINE PREGNANCY: PREG TEST UR: NEGATIVE

## 2013-09-22 MED ORDER — MEDROXYPROGESTERONE ACETATE 150 MG/ML IM SUSP
150.0000 mg | Freq: Once | INTRAMUSCULAR | Status: AC
  Administered 2013-09-22: 150 mg via INTRAMUSCULAR

## 2013-10-11 ENCOUNTER — Other Ambulatory Visit: Payer: Self-pay | Admitting: Adult Health

## 2013-12-13 ENCOUNTER — Encounter: Payer: Self-pay | Admitting: Adult Health

## 2013-12-15 ENCOUNTER — Ambulatory Visit: Payer: BC Managed Care – PPO

## 2013-12-20 ENCOUNTER — Encounter: Payer: Self-pay | Admitting: Adult Health

## 2013-12-20 ENCOUNTER — Ambulatory Visit: Payer: BC Managed Care – PPO

## 2013-12-20 ENCOUNTER — Ambulatory Visit (INDEPENDENT_AMBULATORY_CARE_PROVIDER_SITE_OTHER): Payer: Self-pay | Admitting: Adult Health

## 2013-12-20 DIAGNOSIS — Z3042 Encounter for surveillance of injectable contraceptive: Secondary | ICD-10-CM

## 2013-12-20 DIAGNOSIS — Z3202 Encounter for pregnancy test, result negative: Secondary | ICD-10-CM

## 2013-12-20 LAB — POCT URINE PREGNANCY: PREG TEST UR: NEGATIVE

## 2013-12-20 MED ORDER — MEDROXYPROGESTERONE ACETATE 150 MG/ML IM SUSP
150.0000 mg | Freq: Once | INTRAMUSCULAR | Status: AC
  Administered 2013-12-20: 150 mg via INTRAMUSCULAR

## 2013-12-22 ENCOUNTER — Ambulatory Visit: Payer: BC Managed Care – PPO

## 2014-01-11 DIAGNOSIS — R569 Unspecified convulsions: Secondary | ICD-10-CM

## 2014-01-11 HISTORY — DX: Unspecified convulsions: R56.9

## 2014-01-20 ENCOUNTER — Emergency Department (HOSPITAL_COMMUNITY): Payer: Self-pay

## 2014-01-20 ENCOUNTER — Encounter (HOSPITAL_COMMUNITY): Payer: Self-pay | Admitting: Emergency Medicine

## 2014-01-20 ENCOUNTER — Inpatient Hospital Stay (HOSPITAL_COMMUNITY)
Admission: EM | Admit: 2014-01-20 | Discharge: 2014-01-25 | DRG: 124 | Disposition: A | Discharge status: Home / Self Care | Payer: Self-pay | Attending: Internal Medicine | Admitting: Internal Medicine

## 2014-01-20 DIAGNOSIS — Z992 Dependence on renal dialysis: Secondary | ICD-10-CM

## 2014-01-20 DIAGNOSIS — R569 Unspecified convulsions: Secondary | ICD-10-CM

## 2014-01-20 DIAGNOSIS — E1110 Type 2 diabetes mellitus with ketoacidosis without coma: Secondary | ICD-10-CM | POA: Diagnosis present

## 2014-01-20 DIAGNOSIS — N183 Chronic kidney disease, stage 3 (moderate): Secondary | ICD-10-CM | POA: Diagnosis present

## 2014-01-20 DIAGNOSIS — R1110 Vomiting, unspecified: Secondary | ICD-10-CM

## 2014-01-20 DIAGNOSIS — E039 Hypothyroidism, unspecified: Secondary | ICD-10-CM | POA: Diagnosis present

## 2014-01-20 DIAGNOSIS — E113599 Type 2 diabetes mellitus with proliferative diabetic retinopathy without macular edema, unspecified eye: Secondary | ICD-10-CM | POA: Diagnosis present

## 2014-01-20 DIAGNOSIS — N186 End stage renal disease: Secondary | ICD-10-CM | POA: Diagnosis present

## 2014-01-20 DIAGNOSIS — E1011 Type 1 diabetes mellitus with ketoacidosis with coma: Secondary | ICD-10-CM

## 2014-01-20 DIAGNOSIS — E872 Acidosis, unspecified: Secondary | ICD-10-CM

## 2014-01-20 DIAGNOSIS — E876 Hypokalemia: Secondary | ICD-10-CM | POA: Diagnosis present

## 2014-01-20 DIAGNOSIS — E1065 Type 1 diabetes mellitus with hyperglycemia: Secondary | ICD-10-CM | POA: Diagnosis present

## 2014-01-20 DIAGNOSIS — G9340 Encephalopathy, unspecified: Secondary | ICD-10-CM | POA: Diagnosis present

## 2014-01-20 DIAGNOSIS — J9690 Respiratory failure, unspecified, unspecified whether with hypoxia or hypercapnia: Secondary | ICD-10-CM

## 2014-01-20 DIAGNOSIS — J9601 Acute respiratory failure with hypoxia: Secondary | ICD-10-CM

## 2014-01-20 DIAGNOSIS — J9600 Acute respiratory failure, unspecified whether with hypoxia or hypercapnia: Secondary | ICD-10-CM | POA: Insufficient documentation

## 2014-01-20 DIAGNOSIS — I10 Essential (primary) hypertension: Secondary | ICD-10-CM | POA: Diagnosis present

## 2014-01-20 DIAGNOSIS — I129 Hypertensive chronic kidney disease with stage 1 through stage 4 chronic kidney disease, or unspecified chronic kidney disease: Secondary | ICD-10-CM | POA: Diagnosis present

## 2014-01-20 DIAGNOSIS — Z794 Long term (current) use of insulin: Secondary | ICD-10-CM

## 2014-01-20 DIAGNOSIS — F329 Major depressive disorder, single episode, unspecified: Secondary | ICD-10-CM | POA: Diagnosis present

## 2014-01-20 DIAGNOSIS — D649 Anemia, unspecified: Secondary | ICD-10-CM | POA: Diagnosis present

## 2014-01-20 DIAGNOSIS — N179 Acute kidney failure, unspecified: Secondary | ICD-10-CM | POA: Diagnosis present

## 2014-01-20 DIAGNOSIS — D631 Anemia in chronic kidney disease: Secondary | ICD-10-CM | POA: Diagnosis present

## 2014-01-20 DIAGNOSIS — E10359 Type 1 diabetes mellitus with proliferative diabetic retinopathy without macular edema: Principal | ICD-10-CM | POA: Diagnosis present

## 2014-01-20 DIAGNOSIS — N189 Chronic kidney disease, unspecified: Secondary | ICD-10-CM

## 2014-01-20 DIAGNOSIS — E870 Hyperosmolality and hypernatremia: Secondary | ICD-10-CM | POA: Diagnosis present

## 2014-01-20 LAB — COMPREHENSIVE METABOLIC PANEL
ALBUMIN: 3.6 g/dL (ref 3.5–5.2)
ALK PHOS: 132 U/L — AB (ref 39–117)
ALT: 20 U/L (ref 0–35)
AST: 19 U/L (ref 0–37)
BILIRUBIN TOTAL: 0.4 mg/dL (ref 0.3–1.2)
BUN: 46 mg/dL — AB (ref 6–23)
CHLORIDE: 84 meq/L — AB (ref 96–112)
CO2: 10 meq/L — AB (ref 19–32)
CREATININE: 3.72 mg/dL — AB (ref 0.50–1.10)
Calcium: 9.5 mg/dL (ref 8.4–10.5)
GFR calc Af Amer: 18 mL/min — ABNORMAL LOW (ref >90)
GFR calc non Af Amer: 15 mL/min — ABNORMAL LOW (ref >90)
Glucose, Bld: 1118 mg/dL (ref 70–99)
POTASSIUM: 4.7 meq/L (ref 3.7–5.3)
Sodium: 125 mEq/L — ABNORMAL LOW (ref 137–147)
Total Protein: 7.6 g/dL (ref 6.0–8.3)

## 2014-01-20 LAB — RAPID URINE DRUG SCREEN, HOSP PERFORMED
Amphetamines: NOT DETECTED
BARBITURATES: NOT DETECTED
Benzodiazepines: NOT DETECTED
Cocaine: NOT DETECTED
Opiates: NOT DETECTED
TETRAHYDROCANNABINOL: NOT DETECTED

## 2014-01-20 LAB — BASIC METABOLIC PANEL
ANION GAP: 17 — AB (ref 5–15)
BUN: 36 mg/dL — ABNORMAL HIGH (ref 6–23)
CALCIUM: 8.8 mg/dL (ref 8.4–10.5)
CHLORIDE: 105 meq/L (ref 96–112)
CO2: 17 mEq/L — ABNORMAL LOW (ref 19–32)
Creatinine, Ser: 2.95 mg/dL — ABNORMAL HIGH (ref 0.50–1.10)
GFR calc non Af Amer: 20 mL/min — ABNORMAL LOW (ref >90)
GFR, EST AFRICAN AMERICAN: 24 mL/min — AB (ref >90)
Glucose, Bld: 164 mg/dL — ABNORMAL HIGH (ref 70–99)
Potassium: 3.5 mEq/L — ABNORMAL LOW (ref 3.7–5.3)
Sodium: 139 mEq/L (ref 137–147)

## 2014-01-20 LAB — CBC WITH DIFFERENTIAL/PLATELET
BASOS ABS: 0.1 10*3/uL (ref 0.0–0.1)
BASOS ABS: 0.1 10*3/uL (ref 0.0–0.1)
Basophils Relative: 1 % (ref 0–1)
Basophils Relative: 1 % (ref 0–1)
EOS PCT: 0 % (ref 0–5)
EOS PCT: 0 % (ref 0–5)
Eosinophils Absolute: 0 10*3/uL (ref 0.0–0.7)
Eosinophils Absolute: 0 10*3/uL (ref 0.0–0.7)
HCT: 27 % — ABNORMAL LOW (ref 36.0–46.0)
HCT: 23.1 % — ABNORMAL LOW (ref 36.0–46.0)
Hemoglobin: 9.3 g/dL — ABNORMAL LOW (ref 12.0–15.0)
Hemoglobin: 8 g/dL — ABNORMAL LOW (ref 12.0–15.0)
LYMPHS PCT: 8 % — AB (ref 12–46)
Lymphocytes Relative: 12 % (ref 12–46)
Lymphs Abs: 0.8 10*3/uL (ref 0.7–4.0)
Lymphs Abs: 1.5 10*3/uL (ref 0.7–4.0)
MCH: 28.3 pg (ref 26.0–34.0)
MCH: 27.5 pg (ref 26.0–34.0)
MCHC: 34.4 g/dL (ref 30.0–36.0)
MCHC: 34.6 g/dL (ref 30.0–36.0)
MCV: 82.1 fL (ref 78.0–100.0)
MCV: 79.4 fL (ref 78.0–100.0)
MONO ABS: 0.5 10*3/uL (ref 0.1–1.0)
Monocytes Absolute: 0.4 10*3/uL (ref 0.1–1.0)
Monocytes Relative: 4 % (ref 3–12)
Monocytes Relative: 4 % (ref 3–12)
NEUTROS PCT: 87 % — AB (ref 43–77)
Neutro Abs: 9.1 10*3/uL — ABNORMAL HIGH (ref 1.7–7.7)
Neutro Abs: 10.4 10*3/uL — ABNORMAL HIGH (ref 1.7–7.7)
Neutrophils Relative %: 83 % — ABNORMAL HIGH (ref 43–77)
PLATELETS: 151 10*3/uL (ref 150–400)
PLATELETS: 191 10*3/uL (ref 150–400)
RBC: 3.29 MIL/uL — AB (ref 3.87–5.11)
RBC: 2.91 MIL/uL — ABNORMAL LOW (ref 3.87–5.11)
RDW: 12.5 % (ref 11.5–15.5)
RDW: 12.3 % (ref 11.5–15.5)
WBC: 10.4 10*3/uL (ref 4.0–10.5)
WBC: 12.5 10*3/uL — AB (ref 4.0–10.5)

## 2014-01-20 LAB — BLOOD GAS, ARTERIAL
ACID-BASE DEFICIT: 5.8 mmol/L — AB (ref 0.0–2.0)
Bicarbonate: 17.7 mEq/L — ABNORMAL LOW (ref 20.0–24.0)
FIO2: 40 %
O2 Saturation: 99.4 %
PCO2 ART: 27.2 mmHg — AB (ref 35.0–45.0)
PEEP/CPAP: 5 cmH2O
PH ART: 7.431 (ref 7.350–7.450)
Patient temperature: 37
RATE: 14 resp/min
TCO2: 16.7 mmol/L (ref 0–100)
VT: 450 mL
pO2, Arterial: 183 mmHg — ABNORMAL HIGH (ref 80.0–100.0)

## 2014-01-20 LAB — CSF CELL COUNT WITH DIFFERENTIAL
RBC COUNT CSF: 6 /mm3 — AB
RBC Count, CSF: 157 /mm3 — ABNORMAL HIGH
TUBE #: 1
TUBE #: 4
WBC, CSF: 3 /mm3 (ref 0–5)
WBC, CSF: 0 /mm3 (ref 0–5)

## 2014-01-20 LAB — GLUCOSE, CAPILLARY
GLUCOSE-CAPILLARY: 198 mg/dL — AB (ref 70–99)
GLUCOSE-CAPILLARY: 207 mg/dL — AB (ref 70–99)
Glucose-Capillary: 141 mg/dL — ABNORMAL HIGH (ref 70–99)
Glucose-Capillary: 157 mg/dL — ABNORMAL HIGH (ref 70–99)

## 2014-01-20 LAB — ETHANOL: Alcohol, Ethyl (B): 12 mg/dL — ABNORMAL HIGH (ref 0–11)

## 2014-01-20 LAB — CBG MONITORING, ED
GLUCOSE-CAPILLARY: 577 mg/dL — AB (ref 70–99)
GLUCOSE-CAPILLARY: 283 mg/dL — AB (ref 70–99)
GLUCOSE-CAPILLARY: 227 mg/dL — AB (ref 70–99)
Glucose-Capillary: >600 mg/dL (ref 70–99)
Glucose-Capillary: 468 mg/dL — ABNORMAL HIGH (ref 70–99)

## 2014-01-20 LAB — URINALYSIS, ROUTINE W REFLEX MICROSCOPIC
Bilirubin Urine: NEGATIVE
Glucose, UA: >1000 mg/dL — AB
LEUKOCYTES UA: NEGATIVE
NITRITE: NEGATIVE
Protein, ur: 100 mg/dL — AB
Specific Gravity, Urine: 1.015 (ref 1.005–1.030)
UROBILINOGEN UA: 0.2 mg/dL (ref 0.0–1.0)
pH: 5.5 (ref 5.0–8.0)

## 2014-01-20 LAB — URINE MICROSCOPIC-ADD ON

## 2014-01-20 LAB — PREGNANCY, URINE: PREG TEST UR: NEGATIVE

## 2014-01-20 LAB — PHOSPHORUS: PHOSPHORUS: 2 mg/dL — AB (ref 2.3–4.6)

## 2014-01-20 LAB — KETONES, QUALITATIVE: Acetone, Bld: NEGATIVE

## 2014-01-20 LAB — TRIGLYCERIDES: TRIGLYCERIDES: 114 mg/dL (ref <150)

## 2014-01-20 LAB — GLUCOSE, CSF: GLUCOSE CSF: 293 mg/dL — AB (ref 43–76)

## 2014-01-20 LAB — PROTEIN, CSF: Total  Protein, CSF: 29 mg/dL (ref 15–45)

## 2014-01-20 LAB — MRSA PCR SCREENING: MRSA by PCR: NEGATIVE

## 2014-01-20 LAB — MAGNESIUM: Magnesium: 1.8 mg/dL (ref 1.5–2.5)

## 2014-01-20 LAB — LACTIC ACID, PLASMA: Lactic Acid, Venous: 3.8 mmol/L — ABNORMAL HIGH (ref 0.5–2.2)

## 2014-01-20 MED ORDER — INSULIN ASPART 100 UNIT/ML ~~LOC~~ SOLN
5.0000 [IU] | Freq: Once | SUBCUTANEOUS | Status: AC
  Administered 2014-01-20: 5 [IU] via INTRAVENOUS

## 2014-01-20 MED ORDER — PROPOFOL 10 MG/ML IV EMUL
INTRAVENOUS | Status: AC
  Administered 2014-01-20: 1000 mg via INTRAVENOUS
  Filled 2014-01-20: qty 100

## 2014-01-20 MED ORDER — LORAZEPAM 2 MG/ML IJ SOLN
2.0000 mg | Freq: Once | INTRAMUSCULAR | Status: AC
  Administered 2014-01-20: 2 mg via INTRAVENOUS
  Administered 2014-01-20: 1 mg via INTRAVENOUS
  Administered 2014-01-20: 2 mg via INTRAVENOUS

## 2014-01-20 MED ORDER — SODIUM CHLORIDE 0.9 % IV SOLN
INTRAVENOUS | Status: DC
  Administered 2014-01-20: 3.4 [IU]/h via INTRAVENOUS
  Administered 2014-01-20: 1 [IU]/h via INTRAVENOUS
  Filled 2014-01-20: qty 2.5

## 2014-01-20 MED ORDER — SODIUM CHLORIDE 0.9 % IV BOLUS (SEPSIS)
1000.0000 mL | Freq: Once | INTRAVENOUS | Status: AC
  Administered 2014-01-20: 1000 mL via INTRAVENOUS

## 2014-01-20 MED ORDER — PROPOFOL 10 MG/ML IV BOLUS
50.0000 mg | Freq: Once | INTRAVENOUS | Status: AC
  Administered 2014-01-20: 50 mg via INTRAVENOUS

## 2014-01-20 MED ORDER — NALOXONE HCL 1 MG/ML IJ SOLN
INTRAMUSCULAR | Status: AC
  Administered 2014-01-20: 0.5 mg via INTRAVENOUS
  Filled 2014-01-20: qty 2

## 2014-01-20 MED ORDER — LORAZEPAM 2 MG/ML IJ SOLN
INTRAMUSCULAR | Status: AC
  Filled 2014-01-20: qty 1

## 2014-01-20 MED ORDER — ACETAMINOPHEN 325 MG PO TABS
650.0000 mg | ORAL_TABLET | ORAL | Status: DC | PRN
  Administered 2014-01-20 – 2014-01-24 (×2): 650 mg via ORAL
  Filled 2014-01-20 (×2): qty 2

## 2014-01-20 MED ORDER — PROPOFOL 10 MG/ML IV BOLUS
INTRAVENOUS | Status: AC
Start: 2014-01-20 — End: 2014-01-20
  Filled 2014-01-20: qty 1

## 2014-01-20 MED ORDER — ETOMIDATE 2 MG/ML IV SOLN
INTRAVENOUS | Status: AC
  Administered 2014-01-20: 20 mg via INTRAVENOUS
  Filled 2014-01-20: qty 20

## 2014-01-20 MED ORDER — HYDRALAZINE HCL 20 MG/ML IJ SOLN
10.0000 mg | INTRAMUSCULAR | Status: DC | PRN
  Administered 2014-01-20: 20 mg via INTRAVENOUS
  Administered 2014-01-21: 40 mg via INTRAVENOUS
  Administered 2014-01-23: 20 mg via INTRAVENOUS
  Filled 2014-01-20: qty 1
  Filled 2014-01-20: qty 2
  Filled 2014-01-20: qty 1

## 2014-01-20 MED ORDER — SUCCINYLCHOLINE CHLORIDE 20 MG/ML IJ SOLN
INTRAMUSCULAR | Status: AC
  Administered 2014-01-20: 100 mg via INTRAVENOUS
  Filled 2014-01-20: qty 1

## 2014-01-20 MED ORDER — CHLORHEXIDINE GLUCONATE 0.12 % MT SOLN
15.0000 mL | Freq: Two times a day (BID) | OROMUCOSAL | Status: DC
  Administered 2014-01-20 – 2014-01-22 (×4): 15 mL via OROMUCOSAL
  Filled 2014-01-20 (×4): qty 15

## 2014-01-20 MED ORDER — POTASSIUM CHLORIDE 10 MEQ/100ML IV SOLN
10.0000 meq | INTRAVENOUS | Status: AC
  Administered 2014-01-20: 10 meq via INTRAVENOUS
  Filled 2014-01-20 (×2): qty 100

## 2014-01-20 MED ORDER — SODIUM CHLORIDE 0.9 % IV SOLN: INTRAVENOUS | Status: AC

## 2014-01-20 MED ORDER — VANCOMYCIN HCL IN DEXTROSE 1-5 GM/200ML-% IV SOLN
1000.0000 mg | INTRAVENOUS | Status: DC
  Administered 2014-01-20: 1000 mg via INTRAVENOUS
  Filled 2014-01-20: qty 200

## 2014-01-20 MED ORDER — PROPOFOL 10 MG/ML IV EMUL
0.0000 ug/kg/min | INTRAVENOUS | Status: DC
  Administered 2014-01-20 – 2014-01-21 (×2): 50 ug/kg/min via INTRAVENOUS
  Filled 2014-01-20: qty 100
  Filled 2014-01-20: qty 200

## 2014-01-20 MED ORDER — DEXTROSE-NACL 5-0.45 % IV SOLN
INTRAVENOUS | Status: DC
  Administered 2014-01-20 – 2014-01-22 (×4): via INTRAVENOUS

## 2014-01-20 MED ORDER — FENTANYL CITRATE 0.05 MG/ML IJ SOLN
100.0000 ug | INTRAMUSCULAR | Status: DC | PRN
  Administered 2014-01-20 – 2014-01-22 (×8): 100 ug via INTRAVENOUS
  Filled 2014-01-20 (×10): qty 2

## 2014-01-20 MED ORDER — HALOPERIDOL LACTATE 5 MG/ML IJ SOLN
5.0000 mg | INTRAMUSCULAR | Status: AC
  Administered 2014-01-20: 5 mg via INTRAVENOUS
  Filled 2014-01-20: qty 1

## 2014-01-20 MED ORDER — SODIUM CHLORIDE 0.9 % IV SOLN: INTRAVENOUS | Status: DC

## 2014-01-20 MED ORDER — LORAZEPAM 2 MG/ML IJ SOLN
INTRAMUSCULAR | Status: AC
Start: 2014-01-20 — End: 2014-01-20
  Administered 2014-01-20: 1 mg via INTRAVENOUS
  Filled 2014-01-20: qty 1

## 2014-01-20 MED ORDER — PANTOPRAZOLE SODIUM 40 MG IV SOLR
40.0000 mg | Freq: Every day | INTRAVENOUS | Status: DC
  Administered 2014-01-20 – 2014-01-22 (×3): 40 mg via INTRAVENOUS
  Filled 2014-01-20 (×4): qty 40

## 2014-01-20 MED ORDER — ROCURONIUM BROMIDE 50 MG/5ML IV SOLN
INTRAVENOUS | Status: AC
  Filled 2014-01-20: qty 2

## 2014-01-20 MED ORDER — ACETAMINOPHEN 650 MG RE SUPP
650.0000 mg | Freq: Once | RECTAL | Status: AC
  Administered 2014-01-20: 650 mg via RECTAL
  Filled 2014-01-20: qty 1

## 2014-01-20 MED ORDER — LIDOCAINE HCL (CARDIAC) 20 MG/ML IV SOLN
INTRAVENOUS | Status: AC
  Filled 2014-01-20: qty 5

## 2014-01-20 MED ORDER — DEXTROSE 5 % IV SOLN
1.0000 g | INTRAVENOUS | Status: DC
  Filled 2014-01-20: qty 1

## 2014-01-20 MED ORDER — CETYLPYRIDINIUM CHLORIDE 0.05 % MT LIQD
7.0000 mL | Freq: Four times a day (QID) | OROMUCOSAL | Status: DC
Start: 2014-01-21 — End: 2014-01-22
  Administered 2014-01-21 – 2014-01-22 (×7): 7 mL via OROMUCOSAL

## 2014-01-20 MED ORDER — LEVETIRACETAM IN NACL 1000 MG/100ML IV SOLN
1000.0000 mg | Freq: Once | INTRAVENOUS | Status: AC
  Administered 2014-01-20: 1000 mg via INTRAVENOUS
  Filled 2014-01-20: qty 100

## 2014-01-20 MED ORDER — LEVOTHYROXINE SODIUM 100 MCG IV SOLR
50.0000 ug | Freq: Every day | INTRAVENOUS | Status: DC
  Administered 2014-01-20: 50 ug via INTRAVENOUS
  Filled 2014-01-20 (×2): qty 5

## 2014-01-20 MED ORDER — DEXTROSE 5 % IV SOLN
2.0000 g | Freq: Once | INTRAVENOUS | Status: AC
  Administered 2014-01-20: 2 g via INTRAVENOUS
  Filled 2014-01-20: qty 2

## 2014-01-20 MED ORDER — PROPOFOL 10 MG/ML IV EMUL
2858.0000 ug/min | Freq: Once | INTRAVENOUS | Status: AC
  Administered 2014-01-20: 1000 mg via INTRAVENOUS

## 2014-01-20 MED ORDER — INSULIN ASPART 100 UNIT/ML ~~LOC~~ SOLN
SUBCUTANEOUS | Status: AC
  Filled 2014-01-20: qty 1

## 2014-01-20 MED ORDER — LORAZEPAM 2 MG/ML IJ SOLN
INTRAMUSCULAR | Status: AC
  Administered 2014-01-20: 12:00:00 2 mg via INTRAVENOUS
  Filled 2014-01-20: qty 1

## 2014-01-20 MED ORDER — HEPARIN SODIUM (PORCINE) 5000 UNIT/ML IJ SOLN
5000.0000 [IU] | Freq: Three times a day (TID) | INTRAMUSCULAR | Status: DC
  Administered 2014-01-20 – 2014-01-25 (×14): 5000 [IU] via SUBCUTANEOUS
  Filled 2014-01-20 (×17): qty 1

## 2014-01-20 MED ORDER — PROPOFOL 10 MG/ML IV EMUL
INTRAVENOUS | Status: AC
  Filled 2014-01-20: qty 100

## 2014-01-20 NOTE — ED Notes (Signed)
Parents of patient at bedside.

## 2014-01-20 NOTE — ED Notes (Signed)
Exact glucose level called in by Crystal: [id number]  Dr. Lacinda Axon notified.

## 2014-01-20 NOTE — ED Notes (Signed)
Pt still experiencing active movement of limbs, turning head, reaching for ET tube.  edp notified and orders received.  Pt more calm and relaxed after bolus of propofol.

## 2014-01-20 NOTE — Progress Notes (Signed)
UR Completed.  Cloyde Oregel Jane [phone]  

## 2014-01-20 NOTE — ED Notes (Signed)
hospitalist at bedside

## 2014-01-20 NOTE — ED Provider Notes (Addendum)
CSN: [id number]     Arrival date & time 01/20/14  1132 History  This chart was scribed for Tracy Christen, MD by Stephania Fragmin, ED Scribe. This patient was seen in room APA02/APA02 and the patient's care was started at 11:40 AM.    Chief Complaint  Patient presents with  . Hyperglycemia    The history is provided by a parent and a relative. The history is limited by the condition of the patient. No language interpreter was used.     HPI Comments: Level V caveat for urgent need for intervention Tracy [name] is a 28 y.o. female who presents to the Emergency Department for a seizure. According to her brother-in-law, patient came to him upset, crying, and with slurred speech. She pointed to herself and said "breathe," and brother-in-law brought her to the ED. En route, brother-in-law states she vomited, had a 30 second seizure with whole body shaking, and lost consciousness.   Patient has a seizure in the exam room.  Per mom, patient has had IDDM for 16 years. She had an episode of hyperglycemia last year, but mom reports she had been doing well in the last year, with a reduction in her A1C. Mom states that she has never had a seizure before. Her last normal was last night, when mom was talking to patient about a recent life stressor (patient's dying horse). Mom denies any smoking or drinking, and states that it is unlikely she has taken pain medications because patient had even refused to take pain medication S/P hand surgery recently. Patient lives apart from mom so mom doesn't know if she has been taking insulin, but in the past she has sometimes missed some doses. She normally takes a shot of insulin with food. Mom notes that patient's sugar "gets high when she is upset," and that she doesn't eat on a regular schedule. Patient was laid off from [employer] recently after working there for 10 years, and now works at [employer] in [place] and with horses part-time.  Past Medical History  Diagnosis Date   . Thyroid enlargement     "not on medication at this time"  . Urinary tract infection     hx of  . Anemia     presently on iron supplement  . Diabetes mellitus     insulin pump   . Hypothyroidism   . Anxiety   . HSV-2 (herpes simplex virus 2) infection   . HSV-1 (herpes simplex virus 1) infection   . Detached retina   . Yeast infection     took diflucan saturday   . Hypertension    Past Surgical History  Procedure Laterality Date  . Cholecystectomy    . Pilonidal cyst excision    . Wisdom tooth extraction    . Pars plana vitrectomy  10/01/2011    Procedure: PARS PLANA VITRECTOMY WITH 25 GAUGE;  Surgeon: Hayden Pedro, MD;  Location: [place];  Service: Ophthalmology;  Laterality: Right;  Repair Complex Traction Retinal Detachment  . Trigger finger release Right 09/21/13   Family History  Problem Relation Age of Onset  . Hypertension Other   . Stroke Other   . Diabetes Other   . Cancer Other   . Coronary artery disease Other   . Heart attack Other   . Hyperlipidemia Other    History  Substance Use Topics  . Smoking status: Former Research scientist (life sciences)  . Smokeless tobacco: Never Used  . Alcohol Use: No   OB History  Gravida Para Term Preterm AB TAB SAB Ectopic Multiple Living   2    2  2         Review of Systems  Neurological: Positive for seizures.      Allergies  Penicillins and Sulfa antibiotics  Home Medications   Prior to Admission medications   Medication Sig Start Date End Date Taking? Authorizing Provider  acyclovir (ZOVIRAX) 400 MG tablet Take 400 mg by mouth 2 (two) times daily.   Yes Historical Provider, MD  amLODipine (NORVASC) 10 MG tablet Take 10 mg by mouth daily.   Yes Historical Provider, MD  HYDROcodone-acetaminophen (NORCO/VICODIN) 5-325 MG per tablet Take 1 tablet by mouth every 4 (four) hours as needed (pain).   Yes Historical Provider, MD  levothyroxine (SYNTHROID, LEVOTHROID) 75 MCG tablet Take 75 mcg by mouth daily before breakfast.   Yes  Historical Provider, MD  acyclovir (ZOVIRAX) 400 MG tablet TAKE 1 TABLET (400 MG TOTAL) BY MOUTH 2 (TWO) TIMES DAILY. Patient not taking: Reported on 01/20/2014 10/11/13   Estill Dooms, NP  cyclobenzaprine (FLEXERIL) 10 MG tablet Take 10 mg by mouth at bedtime as needed.  01/01/13   Historical Provider, MD  fluconazole (DIFLUCAN) 150 MG tablet TAKE 1 NOW AND 1 IN 3 DAYS IF NEEDED    Estill Dooms, NP  insulin aspart (NOVOLOG FLEXPEN) 100 UNIT/ML FlexPen Inject into the skin 3 (three) times daily with meals.    Historical Provider, MD  Insulin Glargine (LANTUS SOLOSTAR) 100 UNIT/ML SOPN Inject 28 Units into the skin at bedtime. 01/22/13   Samuella Cota, MD  levothyroxine (SYNTHROID, LEVOTHROID) 25 MCG tablet Take 25 mcg by mouth daily before breakfast.    Historical Provider, MD  lisinopril (PRINIVIL,ZESTRIL) 10 MG tablet Take 10 mg by mouth daily.    Historical Provider, MD  medroxyPROGESTERone (DEPO-PROVERA) 150 MG/ML injection USE AS DIRECTED 06/23/13   Estill Dooms, NP  Multiple Vitamins-Minerals (MULTI-VITAMIN GUMMIES PO) Take by mouth daily.    Historical Provider, MD   BP 176/95 mmHg  Pulse 94  Temp(Src) 101.5 F (38.6 C) (Rectal)  Resp 22  Wt 140 lb (63.504 kg)  SpO2 100% Physical Exam  ED Course  INTUBATION Date/Time: 01/20/2014 5:17 PM Performed by: Tracy [name] Authorized by: Tracy [name] Comments: Endotracheal intubation performed at approximately 1530 after premedication with etomidate 20 mg and succinylcholine 100 mg. Patient was intubated without difficulty with a 7.5 endotracheal tube. Good CO2 return and color change on monitor. 100% oxygenation on monitor. Post chest x-ray showed good tube placement    Procedure #2:   LUMBAR PUNCTURE performed with patient in lateral decubitus position. Needle entered at approximately the L3-4 interspace. Clear fluid return. Patient tolerated procedure well. No obvious complications. Consent given by parents after  explanations of risks and benefits.  DIAGNOSTIC STUDIES: Oxygen Saturation is 97% on room air, normal by my interpretation.    COORDINATION OF CARE: 5:21 PM - Treatment plan includes IV fluids and medication.   Labs Review Labs Reviewed  COMPREHENSIVE METABOLIC PANEL - Abnormal; Notable for the following:    Sodium 125 (*)    Chloride 84 (*)    CO2 10 (*)    Glucose, Bld 1118 (*)    BUN 46 (*)    Creatinine, Ser 3.72 (*)    Alkaline Phosphatase 132 (*)    GFR calc non Af Amer 15 (*)    GFR calc Af Amer 18 (*)    All other components within normal limits  CBC WITH DIFFERENTIAL - Abnormal; Notable for the following:    RBC 3.29 (*)    Hemoglobin 9.3 (*)    HCT 27.0 (*)    Neutrophils Relative % 87 (*)    Neutro Abs 9.1 (*)    Lymphocytes Relative 8 (*)    All other components within normal limits  URINALYSIS, ROUTINE W REFLEX MICROSCOPIC - Abnormal; Notable for the following:    Glucose, UA >1000 (*)    Hgb urine dipstick MODERATE (*)    Ketones, ur TRACE (*)    Protein, ur 100 (*)    All other components within normal limits  ETHANOL - Abnormal; Notable for the following:    Alcohol, Ethyl (B) 12 (*)    All other components within normal limits  URINE MICROSCOPIC-ADD ON - Abnormal; Notable for the following:    Bacteria, UA MANY (*)    All other components within normal limits  BLOOD GAS, ARTERIAL - Abnormal; Notable for the following:    pCO2 arterial 27.2 (*)    pO2, Arterial 183.0 (*)    Bicarbonate 17.7 (*)    Acid-base deficit 5.8 (*)    All other components within normal limits  LACTIC ACID, PLASMA - Abnormal; Notable for the following:    Lactic Acid, Venous 3.8 (*)    All other components within normal limits  CBG MONITORING, ED - Abnormal; Notable for the following:    Glucose-Capillary 577 (*)    All other components within normal limits  CBG MONITORING, ED - Abnormal; Notable for the following:    Glucose-Capillary >600 (*)    All other components  within normal limits  CBG MONITORING, ED - Abnormal; Notable for the following:    Glucose-Capillary 468 (*)    All other components within normal limits  CBG MONITORING, ED - Abnormal; Notable for the following:    Glucose-Capillary 283 (*)    All other components within normal limits  CSF CULTURE  URINE RAPID DRUG SCREEN (HOSP PERFORMED)  PREGNANCY, URINE  CSF CELL COUNT WITH DIFFERENTIAL  CSF CELL COUNT WITH DIFFERENTIAL  GLUCOSE, CSF  PROTEIN, CSF    Imaging Review Ct Head Wo Contrast  01/20/2014   CLINICAL DATA:  Seizure.  Altered mental status.  EXAM: CT HEAD WITHOUT CONTRAST  TECHNIQUE: Contiguous axial images were obtained from the base of the skull through the vertex without intravenous contrast.  COMPARISON:  CT scan dated 01/18/2013  FINDINGS: No mass lesion. No midline shift. No acute hemorrhage or hematoma. No extra-axial fluid collections. No evidence of acute infarction. Brain parenchyma is normal. Osseous structures are normal.  IMPRESSION: Normal exam.   Electronically Signed   By: Rozetta Nunnery M.D.   On: 01/20/2014 13:53   Dg Chest Port 1 View  01/20/2014   CLINICAL DATA:  Seizure  EXAM: PORTABLE CHEST - 1 VIEW  COMPARISON:  01/18/2013  FINDINGS: The heart size and mediastinal contours are within normal limits. Both lungs are clear. The visualized skeletal structures are unremarkable.  IMPRESSION: No active disease.   Electronically Signed   By: Franchot Gallo M.D.   On: 01/20/2014 12:38   Dg Chest Port 1v Same Day  01/20/2014   CLINICAL DATA:  28 year old female intubated for respiratory failure. Initial encounter.  EXAM: PORTABLE CHEST - 1 VIEW SAME DAY  COMPARISON:  1204 hr the same day and earlier.  FINDINGS: Portable AP supine view at 1558 hrs. The patient is rotated to the right. Endotracheal tube in place, tip projects  2 cm above the carina. Enteric tube placed, side hole the level of the gastric body. Normal cardiac size and mediastinal contours. Stable lung  volumes. Allowing for portable technique, the lungs are clear. No pneumothorax or pleural effusion.  IMPRESSION: 1. ET tube and enteric tube appropriately placed. 2.  No acute cardiopulmonary abnormality.   Electronically Signed   By: Lars Pinks M.D.   On: 01/20/2014 16:05     EKG Interpretation   Date/Time:  Thursday January 20 2014 12:00:24 EST Ventricular Rate:  115 PR Interval:  138 QRS Duration: 100 QT Interval:  364 QTC Calculation: 503 R Axis:   99 Text Interpretation:  Sinus tachycardia Borderline right axis deviation  Prolonged QT interval Confirmed by Lacinda Axon  MD, Ranesha Val ([id number]) on 01/20/2014  1:17:33 PM     CRITICAL CARE Performed by: Tracy [name] Total critical care time: 120 Critical care time was exclusive of separately billable procedures and treating other patients. Critical care was necessary to treat or prevent imminent or life-threatening deterioration. Critical care was time spent personally by me on the following activities: development of treatment plan with patient and/or surrogate as well as nursing, discussions with consultants, evaluation of patient's response to treatment, examination of patient, obtaining history from patient or surrogate, ordering and performing treatments and interventions, ordering and review of laboratory studies, ordering and review of radiographic studies, pulse oximetry and re-evaluation of patient's condition. MDM   Final diagnoses:  Seizure  Diabetic ketoacidosis with coma associated with type 1 diabetes mellitus   Complex patient with type 1 diabetes and diabetic ketoacidosis. Suspect seizure related to hyper osmolar state.   Vigorous IV hydration, glucose stabilizer, IV Ativan, IV Keppra.  Vital signs have improved over time. Patient is still postictal at time of admission. Discussed with parents in great detail. Also discussed with Dr. Sarajane Jews [hospitalist].    Patient remained obtunded. She was noted to be febrile. Endotracheal  intubation performed without difficulty. Lumbar puncture performed without difficulty. Maxipime and vancomycin IV started secondary to penicillin allergy.. Discussed with critical care physician from [facility]. Will except transfer. Discussed with family including parents, boyfriend, extended relatives on several occasions. All questions were answered to the best of my ability. Patient's blood pressure and pulses remained solid. She is getting vigorous IV hydration and has good urinary output.  Patient was rechecked multiple times during her emergency department course.  I personally performed the services described in this documentation, which was scribed in my presence. The recorded information has been reviewed and is accurate.        Tracy Christen, MD 01/20/14 [place], MD 01/20/14 [place], MD 01/20/14 [phone]

## 2014-01-20 NOTE — Progress Notes (Addendum)
Agitated RASS +3 despite max dose diprivan and prn fentanyl QTc 0.49 currently on monitor per RN (was 0.503 earlier on 12 lead ekg)   A Agitation  P Haldol 5mg  IV x 1  Dr. Brand Males, M.D., [facility].C.P Pulmonary and Critical Care Medicine Staff Physician [facility] Pager: [phone], If no answer or between  15:00h - 7:00h: call [phone]  01/20/2014 10:04 PM

## 2014-01-20 NOTE — H&P (Signed)
PULMONARY / CRITICAL CARE MEDICINE   Name: ROBEN FRERICH MRN: [id number] DOB: 1985-11-10    ADMISSION DATE:  01/20/2014 CONSULTATION DATE:  01/20/2014  REFERRING MD :  EDP  CHIEF COMPLAINT:  DKA  INITIAL PRESENTATION: 28 year old female DM1 presented to [facility] ED with altered mental status, where she was found to be profoundly hyperglycemic with positive urine ketones. She also experienced seizure in ED for which she required intubation. She was transferred to [facility] for ICU admission.  STUDIES:  CT head 12/10 > no acute intracranial process  SIGNIFICANT EVENTS: 12/10 DKA, seizure, intubated for airway protection  HISTORY OF PRESENT ILLNESS:  28 year old female with PMH as below, presented to [facility] ED with altered mental status. She was in her usual state of health until 12/10 afternoon where she was witnessed by brother to be crying. He addressed her and she was noted to have slurred speech so they planned to bring her to ED. Prior to arrival she had one episode of vomiting, seizure like activity, and was brought to ED. In ED she was noted to have profound hyperglycemia and positive urine ketones. She had an additional seizure which was treated with ativan. Post ictally, she was unable to protect her airway so she was intubated and placed on mechanical ventilator. Transferred to [facility] for ICU admission.   PAST MEDICAL HISTORY :   has a past medical history of Thyroid enlargement; Urinary tract infection; Anemia; Diabetes mellitus; Hypothyroidism; Anxiety; HSV-2 (herpes simplex virus 2) infection; HSV-1 (herpes simplex virus 1) infection; Detached retina; Yeast infection; and Hypertension.  has past surgical history that includes Cholecystectomy; Pilonidal cyst excision; Wisdom tooth extraction; Pars plana vitrectomy (10/01/2011); and Trigger finger release (Right, 09/21/13). Prior to Admission medications   Medication Sig Start Date End Date Taking? Authorizing Provider  acyclovir (ZOVIRAX) 400 MG  tablet Take 400 mg by mouth 2 (two) times daily.   Yes Historical Provider, MD  amLODipine (NORVASC) 10 MG tablet Take 10 mg by mouth daily.   Yes Historical Provider, MD  cyclobenzaprine (FLEXERIL) 10 MG tablet Take 10 mg by mouth at bedtime as needed.  01/01/13  Yes Historical Provider, MD  furosemide (LASIX) 20 MG tablet Take 20 mg by mouth 2 (two) times daily.   Yes Historical Provider, MD  HYDROcodone-acetaminophen (NORCO/VICODIN) 5-325 MG per tablet Take 1 tablet by mouth every 4 (four) hours as needed (pain).   Yes Historical Provider, MD  insulin aspart (NOVOLOG FLEXPEN) 100 UNIT/ML FlexPen Inject into the skin 3 (three) times daily with meals.   Yes Historical Provider, MD  Insulin Glargine (LANTUS SOLOSTAR) 100 UNIT/ML SOPN Inject 28 Units into the skin at bedtime. 01/22/13  Yes Samuella Cota, MD  levothyroxine (SYNTHROID, LEVOTHROID) 75 MCG tablet Take 75 mcg by mouth daily before breakfast.   Yes Historical Provider, MD  medroxyPROGESTERone (DEPO-PROVERA) 150 MG/ML injection USE AS DIRECTED 06/23/13  Yes Estill Dooms, NP  Multiple Vitamins-Minerals (MULTI-VITAMIN GUMMIES PO) Take by mouth daily.   Yes Historical Provider, MD  levothyroxine (SYNTHROID, LEVOTHROID) 25 MCG tablet Take 25 mcg by mouth daily before breakfast.    Historical Provider, MD  lisinopril (PRINIVIL,ZESTRIL) 10 MG tablet Take 10 mg by mouth daily.    Historical Provider, MD   Allergies  Allergen Reactions  . Penicillins Hives and Swelling  . Sulfa Antibiotics Hives    FAMILY HISTORY:  indicated that her mother is alive. She indicated that her father is alive.  SOCIAL HISTORY:  reports that  she has quit smoking. She has never used smokeless tobacco. She reports that she does not drink alcohol or use illicit drugs.  REVIEW OF SYSTEMS: Unable due to intubation  SUBJECTIVE:  Received several doses intermittent fentanyl overnight  VITAL SIGNS: Temp:  [99.3 F (37.4 C)-101.8 F (38.8 C)] 99.5 F  (37.5 C) (12/11 0353) Pulse Rate:  [94-130] 121 (12/11 0600) Resp:  [13-25] 13 (12/11 0600) BP: (117-219)/(63-117) 167/80 mmHg (12/11 0600) SpO2:  [97 %-100 %] 100 % (12/11 0325) FiO2 (%):  [40 %] 40 % (12/11 0400) Weight:  [63.504 kg (140 lb)-64 kg (141 lb 1.5 oz)] 64 kg (141 lb 1.5 oz) (12/10 2000) HEMODYNAMICS:   VENTILATOR SETTINGS: Vent Mode:  [-] PRVC FiO2 (%):  [40 %] 40 % Set Rate:  [12 bmp-14 bmp] 12 bmp Vt Set:  [450 mL] 450 mL PEEP:  [5 cmH20] 5 cmH20 Plateau Pressure:  [10 cmH20-11 cmH20] 10 cmH20 INTAKE / OUTPUT:  Intake/Output Summary (Last 24 hours) at 01/21/14 [id number] Last data filed at 01/21/14 0600  Gross per 24 hour  Intake 1534.1 ml  Output   3660 ml  Net -2125.9 ml    PHYSICAL EXAMINATION: General:  Young female of normal body habitus in NAD Neuro:  Sedated on vent HEENT:  [place]/AT, no JVD noted, no nuchal rigidity with passive ROM Cardiovascular:  RRR, no MRG Lungs:  Respirations even, unlabored. Clear bilateral breath sounds.  Abdomen:  Soft, non-tender, non-distended Musculoskeletal:  No acute deformity Skin:  Intact  LABS:  CBC  Recent Labs Lab 01/20/14 1140 01/20/14 2030  WBC 10.4 12.5*  HGB 9.3* 8.0*  HCT 27.0* 23.1*  PLT 151 191   Coag's No results for input(s): APTT, INR in the last 168 hours. BMET  Recent Labs Lab 01/20/14 2030 01/20/14 2340 01/21/14 0330  NA 139 136* 139  K 3.5* 3.1* 3.1*  CL 105 103 105  CO2 17* 16* 17*  BUN 36* 34* 33*  CREATININE 2.95* 3.11* 3.15*  GLUCOSE 164* 203* 111*   Electrolytes  Recent Labs Lab 01/20/14 2030 01/20/14 2340 01/21/14 0330  CALCIUM 8.8 8.6 8.7  MG 1.8  --   --   PHOS 2.0*  --   --    Sepsis Markers  Recent Labs Lab 01/20/14 1433 01/20/14 2340  LATICACIDVEN 3.8* 1.0   ABG  Recent Labs Lab 01/20/14 1640  PHART 7.431  PCO2ART 27.2*  PO2ART 183.0*   Liver Enzymes  Recent Labs Lab 01/20/14 1140  AST 19  ALT 20  ALKPHOS 132*  BILITOT 0.4  ALBUMIN 3.6    Cardiac Enzymes No results for input(s): TROPONINI, PROBNP in the last 168 hours. Glucose  Recent Labs Lab 01/21/14 0105 01/21/14 0201 01/21/14 0302 01/21/14 0325 01/21/14 0528 01/21/14 0623  GLUCAP 157* 92 70 105* 266* 272*    Imaging Ct Head Wo Contrast  01/20/2014   CLINICAL DATA:  Seizure.  Altered mental status.  EXAM: CT HEAD WITHOUT CONTRAST  TECHNIQUE: Contiguous axial images were obtained from the base of the skull through the vertex without intravenous contrast.  COMPARISON:  CT scan dated 01/18/2013  FINDINGS: No mass lesion. No midline shift. No acute hemorrhage or hematoma. No extra-axial fluid collections. No evidence of acute infarction. Brain parenchyma is normal. Osseous structures are normal.  IMPRESSION: Normal exam.   Electronically Signed   By: Rozetta Nunnery M.D.   On: 01/20/2014 13:53   Dg Chest Port 1 View  01/20/2014   CLINICAL DATA:  Seizure  EXAM:  PORTABLE CHEST - 1 VIEW  COMPARISON:  01/18/2013  FINDINGS: The heart size and mediastinal contours are within normal limits. Both lungs are clear. The visualized skeletal structures are unremarkable.  IMPRESSION: No active disease.   Electronically Signed   By: Franchot Gallo M.D.   On: 01/20/2014 12:38   Dg Chest Port 1v Same Day  01/20/2014   CLINICAL DATA:  28 year old female intubated for respiratory failure. Initial encounter.  EXAM: PORTABLE CHEST - 1 VIEW SAME DAY  COMPARISON:  1204 hr the same day and earlier.  FINDINGS: Portable AP supine view at 1558 hrs. The patient is rotated to the right. Endotracheal tube in place, tip projects 2 cm above the carina. Enteric tube placed, side hole the level of the gastric body. Normal cardiac size and mediastinal contours. Stable lung volumes. Allowing for portable technique, the lungs are clear. No pneumothorax or pleural effusion.  IMPRESSION: 1. ET tube and enteric tube appropriately placed. 2.  No acute cardiopulmonary abnormality.   Electronically Signed   By: Lars Pinks M.D.   On: 01/20/2014 16:05     ASSESSMENT / PLAN:  ENDOCRINE A:   DKA DM1 Hypothyroidism    P:   Initiate DKA protocol IV insulin and electrolyte supplementation per protocol.  IVF per protocol Check TSH Holding home synthroid for now > restart 12/11 am  NEUROLOGIC A:   Seizure likely 2/2 DKA Acute encephalopathy 2/2 acidosis/post-ictal.  P:   RASS goal: -1 IV Keppra Consider neurology consult in AM if not weaning as expected. EEG in AM Propofol gtt for sedation, seizure control. PRN fentanyl for analgesia  PULMONARY OETT 10/12 >>> A: Acute respiratory failure 2/2 post ictal encephalopathy  P:   Full vent support Follow ABG CXR in AM VAP prevention measures per protocol.  Anticipate SBT and goal extubation once metabolic status stabilized, no evidence seizure activity  CARDIOVASCULAR A:  Hyperglycemia  P:  Tele PRN hydralazine to keep SBP < 170 Holding home amlodipine (family states it is unlikely that she takes this anyway due to trouble with pills) Repeat lactic acid  RENAL A:   Acute on chronic kidney disease High AG metabolic acidosis Pseudohyponatremia in setting of hyperglycemia.  Hypokalemia P:   Serial BMP Replace K Hydrate   GASTROINTESTINAL A:   No acute issues  P:   SUP: IV protonix NPO for now.   HEMATOLOGIC A:   Chronic anemia (Hgb above baseline)  P:  Follow CBC Heparin for VTE ppx  INFECTIOUS A:  Concern for meningitis (CSF from ED is benign) P:   Follow cultures Continue cefepime / vanco at least overnight Follow WBC and fever curve   FAMILY  - Updates: parents updated - Inter-disciplinary family meet or Palliative Care meeting due by:  12/17    Georgann Housekeeper, ACNP [facility] Pager [phone] or [phone]  Attending Note:  I have examined patient, reviewed labs, studies and notes. I have discussed the case with Jaclynn Guarneri and I agree with the data and plans as amended  above. She is intubated, sedated, no evidence of any further seizures. She is easily arousable, follows commands. Her metabolic status is improving but not normalized. Plan to continue insulin, likely continue MV until labs improving. On empiric Keppra, empiric abx. Independent CC time 60 minutes  Baltazar Apo, MD, PhD 01/21/2014, 7:14 AM [facility] [phone] or if no answer [phone]

## 2014-01-20 NOTE — ED Notes (Signed)
Insulin rate changed to 5.2 units/hr.

## 2014-01-20 NOTE — Progress Notes (Signed)
[facility] Progress Note Patient Name: Tracy [name] DOB: [birth year] MRN: [id number]   Date of Service  01/20/2014  HPI/Events of Note  Pt adm in tfr from [facility].  Pt with DKA, seizure, VDRF  eICU Interventions  See admission orders, full PCCM H and P to follow     Intervention Category Evaluation Type: New Patient Evaluation  Asencion Noble 01/20/2014, 7:43 PM

## 2014-01-20 NOTE — ED Notes (Signed)
Intubation:  20 mg Etomidate given 1531 100 mg Succinylcholine given 1532  Patient intubated 3:33 PM by Dr Lacinda Axon with 7 mm ETT, 22 cm at lip. + Color change on CO2 detector, bilateral breath sounds noted by RT.

## 2014-01-20 NOTE — ED Notes (Addendum)
Pt brought by private vehicle with seizure and n/v. CBG over 600.

## 2014-01-20 NOTE — ED Notes (Signed)
See note by Iona Coach for initial assessment of patient.

## 2014-01-20 NOTE — Consult Note (Signed)
Consultation in ED  LICHELLE HERSCHBERGER [id number] DOB: 1985-05-29 DOA: 01/20/2014  Referring physician: Dr. Lacinda Axon PCP: Jamal Maes, PA-C   Impression/Recommendations  1. New-onset seizure preceded by acute encephalopathy 2. Profound hyperglycemia/hyperosmolar state 3. Anion gap metabolic acidosis, lactic acidosis, diabetic ketoacidosis 4. Vomiting 5. Hypertensive urgency 6. Acute renal failure superimposed on chronic kidney disease stage III 7. 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 Etiologies of acute encephalopathy (which preceded seizure activity) and seizure are unclear.  Recurrently vomiting, unable to protect airway. Remains unresponsive. Blood pressure spontaneously improved blood sugars have improved with insulin infusion.  Case discussed with Dr. Lacinda Axon in detail. Recommend intubation for airway protection, imaging of the abdomen. She has no history of fever or infectious symptoms to suggest acute CNS process but given new onset seizures would recommend lumbar puncture. Once stabilized consider MRI, EEG.   Consider toxic ingestion (though no history to support this)  I would recommend transfer to a higher level of care with dedicated ICU coverage and neurology 24/7 for this critically ill young patient.   I discussed my recommendations with family members.  Dr. Lacinda Axon has assumed care.  Murray Hodgkins, MD  [name] Pager [phone] 01/20/2014, 3:31 PM   Chief Complaint: seizure   HPI:  28 year old woman with diabetes mellitus type 1 who presented to the emergency department with a family member with history of seizure and vomiting. She had a subsequent seizure in the emergency department which was witnessed by staff and was treated with Ativan. Initial workup revealed profound hyperglycemia, metabolic acidosis, lactic acidosis and acute renal failure. Hospitalist consultation was requested.  Patient  unresponsive. All history obtained from multiple family members including sibling and mother. Patient was in usual state of health yesterday 12/9. Her mother spoke with her last night about 10 PM. She also spoke again with her this morning at 7:30 AM the patient's behavior was normal. No recent illness or new problems known. 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 Later that morning her brother-in-law noticed that she was crying. He asked her why in her behavior was noted to be strange with slurred speech and broken words. He brought her to the emergency department but prior to arrival she vomited and had a seizure.  In the emergency department she had a grand mal seizure per nursing and was treated with Ativan, Keppra, Narcan. Noted to be markedly hypertensive and tachycardic. No temperature recorded. Corrected sodium 141, BUN and creatinine above baseline suggesting acute renal failure. CO2 10. Anion gap 31. Lactic acid 3.8. Hemoglobin stable 9.3. Urinalysis was equivocal. Urine drug screen was negative. Serum ethanol was slightly elevated. Chest x-ray and CT head were unremarkable.  Review of Systems:  Unknown except as in history of present illness.   Past Medical History  Diagnosis Date  . Thyroid enlargement     "not on medication at this time"  . Urinary tract infection     hx of  . Anemia     presently on iron supplement  . Diabetes mellitus     insulin pump   . Hypothyroidism   . Anxiety   . HSV-2 (herpes simplex virus 2) infection   . HSV-1 (herpes simplex virus 1) infection   . Detached retina   . Yeast infection     took diflucan saturday   . Hypertension     Past Surgical History  Procedure Laterality Date  .  Cholecystectomy    . Pilonidal cyst excision    . Wisdom tooth extraction    . Pars plana vitrectomy  10/01/2011    Procedure: PARS PLANA VITRECTOMY WITH 25 GAUGE;  Surgeon: Hayden Pedro, MD;  Location: [facility];  Service: Ophthalmology;  Laterality: Right;  Repair Complex Traction Retinal Detachment  . Trigger finger release Right 09/21/13    Social History:  reports that she has quit smoking. She has never used smokeless tobacco. She reports that she does not drink alcohol or use illicit drugs.  Allergies  Allergen Reactions  . Penicillins Hives and Swelling  . Sulfa Antibiotics Hives    Family History  Problem Relation Age of Onset  . Hypertension Other   . Stroke Other   . Diabetes Other   . Cancer Other   . Coronary artery disease Other   . Heart attack Other   . Hyperlipidemia Other      Prior to Admission medications   Medication Sig Start Date End Date Taking? Authorizing Provider  acyclovir (ZOVIRAX) 400 MG tablet Take 400 mg by mouth 2 (two) times daily.   Yes Historical Provider, MD  amLODipine (NORVASC) 10 MG tablet Take 10 mg by mouth daily.   Yes Historical Provider, MD  HYDROcodone-acetaminophen (NORCO/VICODIN) 5-325 MG per tablet Take 1 tablet by mouth every 4 (four) hours as needed (pain).   Yes Historical Provider, MD  levothyroxine (SYNTHROID, LEVOTHROID) 75 MCG tablet Take 75 mcg by mouth daily before breakfast.   Yes Historical Provider, MD  acyclovir (ZOVIRAX) 400 MG tablet TAKE 1 TABLET (400 MG TOTAL) BY MOUTH 2 (TWO) TIMES DAILY. Patient not taking: Reported on 01/20/2014 10/11/13   Estill Dooms, NP  cyclobenzaprine (FLEXERIL) 10 MG tablet Take 10 mg by mouth at bedtime as needed.  01/01/13   Historical Provider, MD  fluconazole (DIFLUCAN) 150 MG tablet TAKE 1 NOW AND 1 IN 3 DAYS IF NEEDED    Estill Dooms, NP  insulin aspart (NOVOLOG FLEXPEN) 100 UNIT/ML FlexPen Inject into the skin 3 (three) times daily with meals.    Historical Provider, MD  Insulin Glargine (LANTUS SOLOSTAR) 100 UNIT/ML SOPN Inject 28 Units into the skin at bedtime. 01/22/13   Samuella Cota, MD  levothyroxine (SYNTHROID, LEVOTHROID) 25 MCG tablet Take 25 mcg by mouth daily  before breakfast.    Historical Provider, MD  lisinopril (PRINIVIL,ZESTRIL) 10 MG tablet Take 10 mg by mouth daily.    Historical Provider, MD  medroxyPROGESTERone (DEPO-PROVERA) 150 MG/ML injection USE AS DIRECTED 06/23/13   Estill Dooms, NP  Multiple Vitamins-Minerals (MULTI-VITAMIN GUMMIES PO) Take by mouth daily.    Historical Provider, MD   Physical Exam: Filed Vitals:   01/20/14 1400 01/20/14 1430 01/20/14 1432 01/20/14 1500  BP: 181/100 172/99 172/99 178/97  Pulse: 102 95 96 96  Resp: 25 21 18 21   SpO2: 100% 100% 100% 100%    General: Examined in the emergency department. She appears critically ill. Obtunded, nonresponsive. Eyes: Pupils sluggish, round, equal. Lids and irises appear unremarkable. ENT: Very limited exam appears grossly unremarkable Neck: no LAD, masses noted. No meningismus noted. Cardiovascular: RRR, no m/r/g. No LE edema. Respiratory: CTA bilaterally, no w/r/r. Mild increased respiratory effort. Abdomen: soft, ntnd, no hernias Skin: No rash noted. Examined with nurse at bedside, perineum appears unremarkable, no evidence of cellulitis. Musculoskeletal: Cannot assess Psychiatric: Cannot assess Neurologic: Cannot assess  Wt Readings from Last 3 Encounters:  06/25/13 63.504 kg (140 lb)  04/01/13 69.219  kg (152 lb 9.6 oz)  01/20/13 65.3 kg (143 lb 15.4 oz)    Labs on Admission:  Basic Metabolic Panel:  Recent Labs Lab 01/20/14 1140  NA 125*  K 4.7  CL 84*  CO2 10*  GLUCOSE 1118*  BUN 46*  CREATININE 3.72*  CALCIUM 9.5    Liver Function Tests:  Recent Labs Lab 01/20/14 1140  AST 19  ALT 20  ALKPHOS 132*  BILITOT 0.4  PROT 7.6  ALBUMIN 3.6    CBC:  Recent Labs Lab 01/20/14 1140  WBC 10.4  NEUTROABS 9.1*  HGB 9.3*  HCT 27.0*  MCV 82.1  PLT 151    CBG:  Recent Labs Lab 01/20/14 1133 01/20/14 1333 01/20/14 1445  GLUCAP >600* 577* 468*     Radiological Exams on Admission: Ct Head Wo Contrast  01/20/2014    CLINICAL DATA:  Seizure.  Altered mental status.  EXAM: CT HEAD WITHOUT CONTRAST  TECHNIQUE: Contiguous axial images were obtained from the base of the skull through the vertex without intravenous contrast.  COMPARISON:  CT scan dated 01/18/2013  FINDINGS: No mass lesion. No midline shift. No acute hemorrhage or hematoma. No extra-axial fluid collections. No evidence of acute infarction. Brain parenchyma is normal. Osseous structures are normal.  IMPRESSION: Normal exam.   Electronically Signed   By: Rozetta Nunnery M.D.   On: 01/20/2014 13:53   Dg Chest Port 1 View  01/20/2014   CLINICAL DATA:  Seizure  EXAM: PORTABLE CHEST - 1 VIEW  COMPARISON:  01/18/2013  FINDINGS: The heart size and mediastinal contours are within normal limits. Both lungs are clear. The visualized skeletal structures are unremarkable.  IMPRESSION: No active disease.   Electronically Signed   By: Franchot Gallo M.D.   On: 01/20/2014 12:38     Principal Problem:   DKA (diabetic ketoacidoses) Active Problems:   Proliferative diabetic retinopathy   Acute encephalopathy   Acute on chronic renal failure   Seizure   Vomiting   Lactic acid acidosis   Time spent: 60 minutes

## 2014-01-20 NOTE — ED Notes (Signed)
CRITICAL VALUE ALERT  Critical value received:  CO2 10 ; glucose "over 1000"   Date of notification:  01/20/14  Time of notification:  1219  Critical value read back:yes  Nurse who received alert:  Lurline Idol  MD notified (1st page):  1220 Time of first page:  1220    Responding MD:  Dr. Lacinda Axon   Time MD responded:  [phone]

## 2014-01-20 NOTE — ED Notes (Signed)
At approximately 1120 a woman showed at registration claiming her daughter was OTW per POV with a possible hyperglycemic episode.    At approximately 1130 a gentle pulled up with complaints of a passenger in his vehicle with possible hyperglycemic, who he stated " started having a seizure and threw up" while in route. Patient assisted to wheelchair from [place] by female relative, myself and Mary Sella Camera operator. Patient lethargic with vomit covering her clothing upon arrival. After transferring to room 02, CBG showed "high" on hospital glucose monitor, patient than began having active seizure like activity in the upper extremities, bilaterally. Ammonia capsule used with no change in patients mental status. IV was immediately established with 0.2 mg Narcan given as well as Ativan 2mg  IV.   Charge nurse, Dr. [name], staff nurse and others at patients bedside.

## 2014-01-20 NOTE — Progress Notes (Signed)
ANTIBIOTIC CONSULT NOTE - INITIAL  Pharmacy Consult for Vancomycin & Cefepime Indication: meningitis  Allergies  Allergen Reactions  . Penicillins Hives and Swelling  . Sulfa Antibiotics Hives    Patient Measurements: Weight: 140 lb (63.504 kg) Adjusted Body Weight:   Vital Signs: Temp: 101.5 F (38.6 C) (12/10 1600) Temp Source: Rectal (12/10 1600) BP: 176/95 mmHg (12/10 1715) Pulse Rate: 94 (12/10 1715) Intake/Output from previous day:   Intake/Output from this shift: Total I/O In: -  Out: 2600 [Urine:2600]  Labs:  Recent Labs  01/20/14 1140  WBC 10.4  HGB 9.3*  PLT 151  CREATININE 3.72*   CrCl cannot be calculated (Unknown ideal weight.). No results for input(s): VANCOTROUGH, VANCOPEAK, VANCORANDOM, GENTTROUGH, GENTPEAK, GENTRANDOM, TOBRATROUGH, TOBRAPEAK, TOBRARND, AMIKACINPEAK, AMIKACINTROU, AMIKACIN in the last 72 hours.   Microbiology: No results found for this or any previous visit (from the past 720 hour(s)).  Medical History: Past Medical History  Diagnosis Date  . Thyroid enlargement     "not on medication at this time"  . Urinary tract infection     hx of  . Anemia     presently on iron supplement  . Diabetes mellitus     insulin pump   . Hypothyroidism   . Anxiety   . HSV-2 (herpes simplex virus 2) infection   . HSV-1 (herpes simplex virus 1) infection   . Detached retina   . Yeast infection     took diflucan saturday   . Hypertension     Medications:  Scheduled:  . lidocaine (cardiac) 100 mg/23ml       Assessment New-onset seizure preceded by acute encephalopathy Patient intubated, to be transferred to [facility] Vancomycin & Cefepime for possible meningitis Calculated CrCl approximately 18 ml/min  Goal of Therapy:  Vancomycin trough level 15-20 mcg/ml  Plan:  Vancomycin 1 GM IV every 48 hours Cefepime 2 GM IV now, then 1 GM every 24 hours Monitor renal function Vancomycin trough at steady state F/U Cefepime tolerance due  to PCN allergy Labs per protocol  Abner Greenspan, Dawnya Grams Bennett 01/20/2014,5:21 PM

## 2014-01-21 ENCOUNTER — Inpatient Hospital Stay (HOSPITAL_COMMUNITY): Payer: Self-pay

## 2014-01-21 ENCOUNTER — Inpatient Hospital Stay (HOSPITAL_COMMUNITY): Payer: BC Managed Care – PPO

## 2014-01-21 DIAGNOSIS — J9600 Acute respiratory failure, unspecified whether with hypoxia or hypercapnia: Secondary | ICD-10-CM | POA: Insufficient documentation

## 2014-01-21 LAB — BASIC METABOLIC PANEL
ANION GAP: 15 (ref 5–15)
ANION GAP: 17 — AB (ref 5–15)
ANION GAP: 18 — AB (ref 5–15)
Anion gap: 17 — ABNORMAL HIGH (ref 5–15)
Anion gap: 18 — ABNORMAL HIGH (ref 5–15)
Anion gap: 17 — ABNORMAL HIGH (ref 5–15)
BUN: 26 mg/dL — AB (ref 6–23)
BUN: 28 mg/dL — AB (ref 6–23)
BUN: 30 mg/dL — AB (ref 6–23)
BUN: 32 mg/dL — ABNORMAL HIGH (ref 6–23)
BUN: 33 mg/dL — ABNORMAL HIGH (ref 6–23)
BUN: 34 mg/dL — ABNORMAL HIGH (ref 6–23)
CALCIUM: 8.5 mg/dL (ref 8.4–10.5)
CALCIUM: 8.6 mg/dL (ref 8.4–10.5)
CALCIUM: 8.7 mg/dL (ref 8.4–10.5)
CHLORIDE: 106 meq/L (ref 96–112)
CO2: 16 mEq/L — ABNORMAL LOW (ref 19–32)
CO2: 16 meq/L — AB (ref 19–32)
CO2: 16 meq/L — AB (ref 19–32)
CO2: 17 mEq/L — ABNORMAL LOW (ref 19–32)
CO2: 17 meq/L — AB (ref 19–32)
CO2: 17 meq/L — AB (ref 19–32)
CREATININE: 3.06 mg/dL — AB (ref 0.50–1.10)
CREATININE: 3.11 mg/dL — AB (ref 0.50–1.10)
CREATININE: 3.14 mg/dL — AB (ref 0.50–1.10)
CREATININE: 3.15 mg/dL — AB (ref 0.50–1.10)
CREATININE: 3.15 mg/dL — AB (ref 0.50–1.10)
CREATININE: 3.2 mg/dL — AB (ref 0.50–1.10)
Calcium: 8.8 mg/dL (ref 8.4–10.5)
Calcium: 9.2 mg/dL (ref 8.4–10.5)
Calcium: 8.8 mg/dL (ref 8.4–10.5)
Chloride: 103 mEq/L (ref 96–112)
Chloride: 105 mEq/L (ref 96–112)
Chloride: 104 mEq/L (ref 96–112)
Chloride: 107 mEq/L (ref 96–112)
Chloride: 106 mEq/L (ref 96–112)
GFR calc Af Amer: 21 mL/min — ABNORMAL LOW (ref >90)
GFR calc Af Amer: 23 mL/min — ABNORMAL LOW (ref >90)
GFR calc non Af Amer: 18 mL/min — ABNORMAL LOW (ref >90)
GFR calc non Af Amer: 19 mL/min — ABNORMAL LOW (ref >90)
GFR, EST AFRICAN AMERICAN: 22 mL/min — AB (ref >90)
GFR, EST AFRICAN AMERICAN: 22 mL/min — AB (ref >90)
GFR, EST AFRICAN AMERICAN: 22 mL/min — AB (ref >90)
GFR, EST AFRICAN AMERICAN: 22 mL/min — AB (ref >90)
GFR, EST NON AFRICAN AMERICAN: 19 mL/min — AB (ref >90)
GFR, EST NON AFRICAN AMERICAN: 19 mL/min — AB (ref >90)
GFR, EST NON AFRICAN AMERICAN: 19 mL/min — AB (ref >90)
GFR, EST NON AFRICAN AMERICAN: 19 mL/min — AB (ref >90)
GLUCOSE: 146 mg/dL — AB (ref 70–99)
Glucose, Bld: 203 mg/dL — ABNORMAL HIGH (ref 70–99)
Glucose, Bld: 111 mg/dL — ABNORMAL HIGH (ref 70–99)
Glucose, Bld: 217 mg/dL — ABNORMAL HIGH (ref 70–99)
Glucose, Bld: 119 mg/dL — ABNORMAL HIGH (ref 70–99)
Glucose, Bld: 166 mg/dL — ABNORMAL HIGH (ref 70–99)
Potassium: 3.1 mEq/L — ABNORMAL LOW (ref 3.7–5.3)
Potassium: 3.1 mEq/L — ABNORMAL LOW (ref 3.7–5.3)
Potassium: 3.2 mEq/L — ABNORMAL LOW (ref 3.7–5.3)
Potassium: 3.6 mEq/L — ABNORMAL LOW (ref 3.7–5.3)
Potassium: 4 mEq/L (ref 3.7–5.3)
Potassium: 3.6 mEq/L — ABNORMAL LOW (ref 3.7–5.3)
SODIUM: 136 meq/L — AB (ref 137–147)
SODIUM: 138 meq/L (ref 137–147)
SODIUM: 139 meq/L (ref 137–147)
Sodium: 142 mEq/L (ref 137–147)
Sodium: 139 mEq/L (ref 137–147)
Sodium: 138 mEq/L (ref 137–147)

## 2014-01-21 LAB — GLUCOSE, CAPILLARY
GLUCOSE-CAPILLARY: 92 mg/dL (ref 70–99)
GLUCOSE-CAPILLARY: 70 mg/dL (ref 70–99)
GLUCOSE-CAPILLARY: 266 mg/dL — AB (ref 70–99)
GLUCOSE-CAPILLARY: 272 mg/dL — AB (ref 70–99)
GLUCOSE-CAPILLARY: 132 mg/dL — AB (ref 70–99)
GLUCOSE-CAPILLARY: 109 mg/dL — AB (ref 70–99)
GLUCOSE-CAPILLARY: 165 mg/dL — AB (ref 70–99)
GLUCOSE-CAPILLARY: 174 mg/dL — AB (ref 70–99)
GLUCOSE-CAPILLARY: 214 mg/dL — AB (ref 70–99)
GLUCOSE-CAPILLARY: 244 mg/dL — AB (ref 70–99)
GLUCOSE-CAPILLARY: 218 mg/dL — AB (ref 70–99)
Glucose-Capillary: 157 mg/dL — ABNORMAL HIGH (ref 70–99)
Glucose-Capillary: 182 mg/dL — ABNORMAL HIGH (ref 70–99)
Glucose-Capillary: 105 mg/dL — ABNORMAL HIGH (ref 70–99)
Glucose-Capillary: 131 mg/dL — ABNORMAL HIGH (ref 70–99)
Glucose-Capillary: 240 mg/dL — ABNORMAL HIGH (ref 70–99)
Glucose-Capillary: 117 mg/dL — ABNORMAL HIGH (ref 70–99)
Glucose-Capillary: 151 mg/dL — ABNORMAL HIGH (ref 70–99)
Glucose-Capillary: 153 mg/dL — ABNORMAL HIGH (ref 70–99)
Glucose-Capillary: 182 mg/dL — ABNORMAL HIGH (ref 70–99)

## 2014-01-21 LAB — TSH: TSH: 9.98 u[IU]/mL — AB (ref 0.350–4.500)

## 2014-01-21 LAB — LACTIC ACID, PLASMA: Lactic Acid, Venous: 1 mmol/L (ref 0.5–2.2)

## 2014-01-21 MED ORDER — DEXMEDETOMIDINE HCL IN NACL 200 MCG/50ML IV SOLN
0.0000 ug/kg/h | INTRAVENOUS | Status: DC
  Filled 2014-01-21: qty 50

## 2014-01-21 MED ORDER — DEXTROSE 50 % IV SOLN
INTRAVENOUS | Status: AC
Start: 2014-01-21 — End: 2014-01-21
  Filled 2014-01-21: qty 50

## 2014-01-21 MED ORDER — LEVOTHYROXINE SODIUM 100 MCG IV SOLR
37.5000 ug | Freq: Every day | INTRAVENOUS | Status: DC
  Administered 2014-01-21: 37.5 ug via INTRAVENOUS
  Filled 2014-01-21 (×2): qty 5

## 2014-01-21 MED ORDER — DEXTROSE 50 % IV SOLN
12.0000 mL | Freq: Once | INTRAVENOUS | Status: AC
  Administered 2014-01-21: 12 mL via INTRAVENOUS

## 2014-01-21 MED ORDER — POTASSIUM CHLORIDE 20 MEQ PO PACK: 40.0000 meq | PACK | ORAL | Status: DC

## 2014-01-21 MED ORDER — POTASSIUM CHLORIDE 20 MEQ/15ML (10%) PO SOLN
40.0000 meq | ORAL | Status: AC
  Administered 2014-01-21: 40 meq
  Filled 2014-01-21 (×3): qty 30

## 2014-01-21 MED ORDER — POTASSIUM CHLORIDE 10 MEQ/100ML IV SOLN
10.0000 meq | INTRAVENOUS | Status: AC
  Administered 2014-01-21 (×4): 10 meq via INTRAVENOUS
  Filled 2014-01-21 (×4): qty 100

## 2014-01-21 NOTE — Procedures (Signed)
Extubation Procedure Note  Patient Details:   Name: Tracy [name] DOB: 17-Aug-1985 MRN: [id number]   Airway Documentation:     Evaluation  O2 sats: stable throughout Complications: No apparent complications Patient did tolerate procedure well. Bilateral Breath Sounds: Clear, Diminished Suctioning: Oral, Airway Yes  Dr Lamonte Sakai at bedside, per his verbal order, pt extubated to room air due to restlessness. Pt vomited brown thin liquid due to OG not hooked up to suction. Pt extremely aggitated and trying to self extubate. Pt not following commands at this time. RT will continue to monitor.  Laymond Purser M 01/21/2014, 9:02 AM

## 2014-01-21 NOTE — Procedures (Signed)
ELECTROENCEPHALOGRAM REPORT  Date of Study: 01/21/2014  Patient's Name: Tracy [name] MRN: [id number] Date of Birth: May 04, 1985  Referring Provider: Dr. Asencion Noble  Clinical History: This is a 28 year old female with altered mental status, where she was found to be profoundly hyperglycemic with positive urine ketones. She also experienced a seizure in the ED for which she required intubation. Patient extubated about 1 hr prior to start of EEG.  Medications: Propofol was discontinued about 30 min. prior to start of EEG acetaminophen (TYLENOL) tablet 650 mg ceFEPIme (MAXIPIME) 1 g in dextrose 5 % 50 mL IVPB levothyroxine (SYNTHROID, LEVOTHROID) injection 37.5 mcg pantoprazole (PROTONIX) injection 40 mg vancomycin (VANCOCIN) IVPB 1000 mg/200 mL premix  Technical Summary: A multichannel digital EEG recording measured by the international 10-20 system with electrodes applied with paste and impedances below 5000 ohms performed as portable with EKG monitoring in an asleep patient.  Hyperventilation and photic stimulation were not performed.  The digital EEG was referentially recorded, reformatted, and digitally filtered in a variety of bipolar and referential montages for optimal display.   Description: The patient is asleep during the recording. Propofol was discontinued 30 minutes prior to the study. There is no clear posterior dominant rhythm. The background consists of diffuse theta and delta slowing. Sleep architecture with vertex waves and symmetric sleep spindles are noted. With noxious stimulation, patient grimaces but otherwise is unresponsive. There is reactivity noted with an increase in medium to high amplitude diffuse 1.5 to 2 Hz delta activity seen with noxious stimulation. Hyperventilation and photic stimulation were not performed. There were no epileptiform discharges or electrographic seizures seen.    EKG lead showed sinus tachycardia.  Impression: This asleep EEG is  abnormal due to moderate to severe diffuse slowing of the waking background.  Clinical Correlation of the above findings indicates diffuse cerebral dysfunction that is non-specific in etiology and can be seen with hypoxic/ischemic injury, toxic/metabolic encephalopathies, or medication effect from Propofol. The absence of epileptiform discharges does not rule out a clinical diagnosis of epilepsy.  Clinical correlation is advised.   Ellouise Newer, M.D.

## 2014-01-21 NOTE — Progress Notes (Signed)
Routine EEG completed at bedside, results pending. 

## 2014-01-22 DIAGNOSIS — J9600 Acute respiratory failure, unspecified whether with hypoxia or hypercapnia: Secondary | ICD-10-CM

## 2014-01-22 LAB — CBC
HCT: 21.6 % — ABNORMAL LOW (ref 36.0–46.0)
Hemoglobin: 7.2 g/dL — ABNORMAL LOW (ref 12.0–15.0)
MCH: 27.5 pg (ref 26.0–34.0)
MCHC: 33.3 g/dL (ref 30.0–36.0)
MCV: 82.4 fL (ref 78.0–100.0)
PLATELETS: 158 10*3/uL (ref 150–400)
RBC: 2.62 MIL/uL — ABNORMAL LOW (ref 3.87–5.11)
RDW: 13.2 % (ref 11.5–15.5)
WBC: 10.2 10*3/uL (ref 4.0–10.5)

## 2014-01-22 LAB — GLUCOSE, CAPILLARY
GLUCOSE-CAPILLARY: 128 mg/dL — AB (ref 70–99)
GLUCOSE-CAPILLARY: 110 mg/dL — AB (ref 70–99)
GLUCOSE-CAPILLARY: 100 mg/dL — AB (ref 70–99)
GLUCOSE-CAPILLARY: 223 mg/dL — AB (ref 70–99)
GLUCOSE-CAPILLARY: 207 mg/dL — AB (ref 70–99)
GLUCOSE-CAPILLARY: 149 mg/dL — AB (ref 70–99)
GLUCOSE-CAPILLARY: 230 mg/dL — AB (ref 70–99)
Glucose-Capillary: 153 mg/dL — ABNORMAL HIGH (ref 70–99)
Glucose-Capillary: 105 mg/dL — ABNORMAL HIGH (ref 70–99)
Glucose-Capillary: 147 mg/dL — ABNORMAL HIGH (ref 70–99)
Glucose-Capillary: 165 mg/dL — ABNORMAL HIGH (ref 70–99)
Glucose-Capillary: 194 mg/dL — ABNORMAL HIGH (ref 70–99)
Glucose-Capillary: 196 mg/dL — ABNORMAL HIGH (ref 70–99)
Glucose-Capillary: 235 mg/dL — ABNORMAL HIGH (ref 70–99)
Glucose-Capillary: 87 mg/dL (ref 70–99)
Glucose-Capillary: 128 mg/dL — ABNORMAL HIGH (ref 70–99)
Glucose-Capillary: 146 mg/dL — ABNORMAL HIGH (ref 70–99)
Glucose-Capillary: 199 mg/dL — ABNORMAL HIGH (ref 70–99)
Glucose-Capillary: 210 mg/dL — ABNORMAL HIGH (ref 70–99)

## 2014-01-22 LAB — BASIC METABOLIC PANEL
ANION GAP: 13 (ref 5–15)
ANION GAP: 15 (ref 5–15)
Anion gap: 15 (ref 5–15)
Anion gap: 15 (ref 5–15)
Anion gap: 12 (ref 5–15)
BUN: 24 mg/dL — AB (ref 6–23)
BUN: 24 mg/dL — ABNORMAL HIGH (ref 6–23)
BUN: 23 mg/dL (ref 6–23)
BUN: 22 mg/dL (ref 6–23)
BUN: 23 mg/dL (ref 6–23)
CALCIUM: 8.6 mg/dL (ref 8.4–10.5)
CALCIUM: 8.5 mg/dL (ref 8.4–10.5)
CALCIUM: 8.4 mg/dL (ref 8.4–10.5)
CHLORIDE: 109 meq/L (ref 96–112)
CHLORIDE: 106 meq/L (ref 96–112)
CHLORIDE: 107 meq/L (ref 96–112)
CO2: 18 mEq/L — ABNORMAL LOW (ref 19–32)
CO2: 16 mEq/L — ABNORMAL LOW (ref 19–32)
CO2: 17 mEq/L — ABNORMAL LOW (ref 19–32)
CO2: 19 meq/L (ref 19–32)
CO2: 22 meq/L (ref 19–32)
CREATININE: 3.22 mg/dL — AB (ref 0.50–1.10)
CREATININE: 3.19 mg/dL — AB (ref 0.50–1.10)
CREATININE: 2.97 mg/dL — AB (ref 0.50–1.10)
Calcium: 8.6 mg/dL (ref 8.4–10.5)
Calcium: 8.5 mg/dL (ref 8.4–10.5)
Chloride: 110 mEq/L (ref 96–112)
Chloride: 110 mEq/L (ref 96–112)
Creatinine, Ser: 3.01 mg/dL — ABNORMAL HIGH (ref 0.50–1.10)
Creatinine, Ser: 2.97 mg/dL — ABNORMAL HIGH (ref 0.50–1.10)
GFR calc Af Amer: 23 mL/min — ABNORMAL LOW (ref >90)
GFR calc non Af Amer: 19 mL/min — ABNORMAL LOW (ref >90)
GFR calc non Af Amer: 20 mL/min — ABNORMAL LOW (ref >90)
GFR calc non Af Amer: 20 mL/min — ABNORMAL LOW (ref >90)
GFR calc non Af Amer: 20 mL/min — ABNORMAL LOW (ref >90)
GFR, EST AFRICAN AMERICAN: 21 mL/min — AB (ref >90)
GFR, EST AFRICAN AMERICAN: 21 mL/min — AB (ref >90)
GFR, EST AFRICAN AMERICAN: 23 mL/min — AB (ref >90)
GFR, EST AFRICAN AMERICAN: 23 mL/min — AB (ref >90)
GFR, EST NON AFRICAN AMERICAN: 18 mL/min — AB (ref >90)
Glucose, Bld: 127 mg/dL — ABNORMAL HIGH (ref 70–99)
Glucose, Bld: 189 mg/dL — ABNORMAL HIGH (ref 70–99)
Glucose, Bld: 166 mg/dL — ABNORMAL HIGH (ref 70–99)
Glucose, Bld: 228 mg/dL — ABNORMAL HIGH (ref 70–99)
Glucose, Bld: 182 mg/dL — ABNORMAL HIGH (ref 70–99)
Potassium: 3.9 mEq/L (ref 3.7–5.3)
Potassium: 3.6 mEq/L — ABNORMAL LOW (ref 3.7–5.3)
Potassium: 3.6 mEq/L — ABNORMAL LOW (ref 3.7–5.3)
Potassium: 3.8 mEq/L (ref 3.7–5.3)
Potassium: 3.5 mEq/L — ABNORMAL LOW (ref 3.7–5.3)
Sodium: 141 mEq/L (ref 137–147)
Sodium: 141 mEq/L (ref 137–147)
Sodium: 141 mEq/L (ref 137–147)
Sodium: 140 mEq/L (ref 137–147)
Sodium: 141 mEq/L (ref 137–147)

## 2014-01-22 LAB — PHOSPHORUS: Phosphorus: 2.8 mg/dL (ref 2.3–4.6)

## 2014-01-22 LAB — MAGNESIUM: MAGNESIUM: 1.6 mg/dL (ref 1.5–2.5)

## 2014-01-22 LAB — PROCALCITONIN: Procalcitonin: 0.95 ng/mL

## 2014-01-22 MED ORDER — MAGNESIUM SULFATE 4 GM/100ML IV SOLN
4.0000 g | Freq: Once | INTRAVENOUS | Status: AC
  Administered 2014-01-22: 4 g via INTRAVENOUS
  Filled 2014-01-22 (×2): qty 100

## 2014-01-22 MED ORDER — INSULIN GLARGINE 100 UNIT/ML ~~LOC~~ SOLN
12.0000 [IU] | Freq: Every day | SUBCUTANEOUS | Status: DC
  Administered 2014-01-22: 12 [IU] via SUBCUTANEOUS
  Filled 2014-01-22 (×2): qty 0.12

## 2014-01-22 MED ORDER — INSULIN ASPART 100 UNIT/ML ~~LOC~~ SOLN
0.0000 [IU] | Freq: Every day | SUBCUTANEOUS | Status: DC
  Administered 2014-01-22: 2 [IU] via SUBCUTANEOUS

## 2014-01-22 MED ORDER — SODIUM BICARBONATE 8.4 % IV SOLN
INTRAVENOUS | Status: DC
  Administered 2014-01-22 – 2014-01-23 (×2): via INTRAVENOUS
  Filled 2014-01-22 (×3): qty 150

## 2014-01-22 MED ORDER — LEVOTHYROXINE SODIUM 100 MCG PO TABS
100.0000 ug | ORAL_TABLET | Freq: Every day | ORAL | Status: DC
  Administered 2014-01-22 – 2014-01-25 (×4): 100 ug via ORAL
  Filled 2014-01-22 (×5): qty 1

## 2014-01-22 MED ORDER — MAGNESIUM SULFATE 50 % IJ SOLN
4.0000 g | Freq: Once | INTRAVENOUS | Status: DC
  Filled 2014-01-22: qty 8

## 2014-01-22 MED ORDER — INSULIN ASPART 100 UNIT/ML ~~LOC~~ SOLN
0.0000 [IU] | Freq: Three times a day (TID) | SUBCUTANEOUS | Status: DC
  Administered 2014-01-22: 3 [IU] via SUBCUTANEOUS
  Administered 2014-01-22: 5 [IU] via SUBCUTANEOUS
  Administered 2014-01-23: 15 [IU] via SUBCUTANEOUS

## 2014-01-22 NOTE — Progress Notes (Signed)
PULMONARY / CRITICAL CARE MEDICINE   Name: Tracy [name] MRN: [id number] DOB: 29-Jan-1986    ADMISSION DATE:  01/20/2014 CONSULTATION DATE:  01/20/2014  REFERRING MD :  EDP  CHIEF COMPLAINT:  DKA  INITIAL PRESENTATION: 28 year old female DM1 presented to [facility] ED with altered mental status, where she was found to be profoundly hyperglycemic with positive urine ketones. She also experienced seizure in ED for which she required intubation. She was transferred to [facility] for ICU admission.  STUDIES:  CT head 12/10 > no acute intracranial process  SIGNIFICANT EVENTS: 12/10 DKA, seizure, intubated for airway protection  SUBJECTIVE:  Extubated 12/11.  Feels "terrible".  C/o back pain.  Denies chest pain, abd pain, SOB.   VITAL SIGNS: Temp:  [98.9 F (37.2 C)-100.9 F (38.3 C)] 99.4 F (37.4 C) (12/12 0725) Pulse Rate:  [87-123] 93 (12/12 0700) Resp:  [8-27] 13 (12/12 0800) BP: (127-162)/(70-85) 144/82 mmHg (12/12 0800) SpO2:  [97 %-100 %] 100 % (12/11 1600) HEMODYNAMICS:   VENTILATOR SETTINGS:   INTAKE / OUTPUT:  Intake/Output Summary (Last 24 hours) at 01/22/14 0851 Last data filed at 01/22/14 0800  Gross per 24 hour  Intake 2241.77 ml  Output   2985 ml  Net -743.23 ml    PHYSICAL EXAMINATION: General:  Young female of normal body habitus in NAD Neuro:  Awake, alert, appropriate, drowsy intermittently  HEENT:  [place]/AT, no JVD noted, no nuchal rigidity with passive ROM Cardiovascular:  RRR, no MRG Lungs:  Respirations even, unlabored. Clear bilateral breath sounds.  Abdomen:  Soft, non-tender, non-distended Musculoskeletal:  No acute deformity, no edema    LABS:  CBC  Recent Labs Lab 01/20/14 1140 01/20/14 2030 01/22/14 0248  WBC 10.4 12.5* 10.2  HGB 9.3* 8.0* 7.2*  HCT 27.0* 23.1* 21.6*  PLT 151 191 158   Coag's No results for input(s): APTT, INR in the last 168 hours. BMET  Recent Labs Lab 01/21/14 1917 01/21/14 2323 01/22/14 0248  NA 138 141 141   K 3.6* 3.9 3.6*  CL 106 110 110  CO2 17* 18* 16*  BUN 26* 24* 24*  CREATININE 3.15* 3.22* 3.19*  GLUCOSE 166* 127* 189*   Electrolytes  Recent Labs Lab 01/20/14 2030  01/21/14 1917 01/21/14 2323 01/22/14 0248  CALCIUM 8.8  < > 8.5 8.6 8.5  MG 1.8  --   --   --  1.6  PHOS 2.0*  --   --   --  2.8  < > = values in this interval not displayed. Sepsis Markers  Recent Labs Lab 01/20/14 1433 01/20/14 2340  LATICACIDVEN 3.8* 1.0   ABG  Recent Labs Lab 01/20/14 1640  PHART 7.431  PCO2ART 27.2*  PO2ART 183.0*   Liver Enzymes  Recent Labs Lab 01/20/14 1140  AST 19  ALT 20  ALKPHOS 132*  BILITOT 0.4  ALBUMIN 3.6   Cardiac Enzymes No results for input(s): TROPONINI, PROBNP in the last 168 hours. Glucose  Recent Labs Lab 01/22/14 0048 01/22/14 0151 01/22/14 0254 01/22/14 0347 01/22/14 0451 01/22/14 0558  GLUCAP 196* 235* 194* 153* 100* 87    Imaging Dg Chest Port 1 View  01/21/2014   CLINICAL DATA:  28 year old female with respiratory failure, intubated. Endotracheal tube position. Initial encounter.  EXAM: PORTABLE CHEST - 1 VIEW  COMPARISON:  01/20/2014 and earlier.  FINDINGS: Portable AP semi upright view at 0514 hrs. Endotracheal tube tip in good position just below the clavicles. Enteric tube is stable, side hole projects over  the gastric fundus. Normal cardiac size and mediastinal contours. Stable lung volumes and ventilation. No confluent pulmonary opacity, pneumothorax or pleural effusion identified.  IMPRESSION: 1. Stable and satisfactory lines and tubes. 2.  No acute cardiopulmonary abnormality.   Electronically Signed   By: Lars Pinks M.D.   On: 01/21/2014 07:37     ASSESSMENT / PLAN:  ENDOCRINE A:   DKA DM1 Hypothyroidism -- tsh=9.980 Lik[place] type 4 RTA contribution P:   Cont insulin gtt per protocol until gap closed- acidosis much improved but gap remains with small AG and larger NONAG IVF per protocol but low threshold to dc this protocol  as GAP is so small, avoid saline dc Frequent bmet with addition bicarb for NONAG TSH elevated - resume PO synthroid and increase from home dose 75-->100 for now and recheck tsh in 6 weeks  Attempt transition given degree of variability glu per mom Add lantis, SSi  NEUROLOGIC A:   Seizure likely 2/2 DKA Acute encephalopathy 2/2 acidosis/post-ictal - improved   P:   IV Keppra, consider 14 days then dc / neurology evaluation  EEG shows metabolic changes from prior high sugars likely PRN fentanyl for analgesia OOB   PULMONARY OETT 12/10>>>12/11 A: Acute respiratory failure 2/2 post ictal encephalopathy - resolved   P:   Mobilize  CARDIOVASCULAR A:  Hyperglycemia  P:  Tele PRN hydralazine to keep SBP < 170, NOT required Holding home amlodipine (family states it is unlikely that she takes this anyway due to trouble with pills)  RENAL A:   Acute on chronic kidney disease -- baseline Scr 1 year ago [id number] High AG metabolic acidosis resolving NONAG, r/o RTA 4 Pseudohyponatremia in setting of hyperglycemia.  Hypokalemia HYpomag P:   Serial BMP Add bicarb for NONAG om setting ARF Replace K PRN  Hydrate   GASTROINTESTINAL A:   NPO  P:   SUP: IV protonix NPO for now until off drip  HEMATOLOGIC A:   Chronic anemia (Hgb above baseline)  P:  Follow CBC Heparin for VTE ppx  INFECTIOUS A:  Concern for meningitis (CSF from ED is benign) - afebrile, leukocytosis resolved  P:   Follow cultures - neg   FAMILY  - Updates: no family available 12/12 - Inter-disciplinary family meet or Palliative Care meeting due by:  12/17   Nickolas Madrid, NP 01/22/2014  8:51 AM Pager: [phone] or [phone]  STAFF NOTE: Linwood Dibbles, MD FACP have personally reviewed patient's available data, including medical history, events of note, physical examination and test results as part of my evaluation. I have discussed with resident/NP and other care providers such as  pharmacist, RN and RRT. In addition, I personally evaluated patient and elicited key findings of: No distress, in bed, glu bvariable, NONAG noted, add bicarb, avoid saline, mag supp, lantus, SSI, consider d from unit, eeg noted, keppra x 2 weeks, neuro evaluation  Lavon Paganini. Titus Mould, MD, [place] Pgr: [facility] 01/22/2014 10:28 AM

## 2014-01-22 NOTE — Progress Notes (Signed)
Patient taken off insulin drip, per Dr. Titus Mould orders. "1 hr after lantus is given"

## 2014-01-23 LAB — CBC
HCT: 21.2 % — ABNORMAL LOW (ref 36.0–46.0)
HEMOGLOBIN: 7.1 g/dL — AB (ref 12.0–15.0)
MCH: 27.6 pg (ref 26.0–34.0)
MCHC: 33.5 g/dL (ref 30.0–36.0)
MCV: 82.5 fL (ref 78.0–100.0)
PLATELETS: 133 10*3/uL — AB (ref 150–400)
RBC: 2.57 MIL/uL — AB (ref 3.87–5.11)
RDW: 12.9 % (ref 11.5–15.5)
WBC: 6.3 10*3/uL (ref 4.0–10.5)

## 2014-01-23 LAB — BASIC METABOLIC PANEL
ANION GAP: 12 (ref 5–15)
Anion gap: 17 — ABNORMAL HIGH (ref 5–15)
Anion gap: 18 — ABNORMAL HIGH (ref 5–15)
BUN: 24 mg/dL — ABNORMAL HIGH (ref 6–23)
BUN: 24 mg/dL — ABNORMAL HIGH (ref 6–23)
BUN: 25 mg/dL — AB (ref 6–23)
CALCIUM: 8.3 mg/dL — AB (ref 8.4–10.5)
CALCIUM: 8.9 mg/dL (ref 8.4–10.5)
CHLORIDE: 103 meq/L (ref 96–112)
CO2: 20 mEq/L (ref 19–32)
CO2: 23 meq/L (ref 19–32)
CO2: 21 meq/L (ref 19–32)
CREATININE: 2.88 mg/dL — AB (ref 0.50–1.10)
Calcium: 8.2 mg/dL — ABNORMAL LOW (ref 8.4–10.5)
Chloride: 103 mEq/L (ref 96–112)
Chloride: 99 mEq/L (ref 96–112)
Creatinine, Ser: 2.83 mg/dL — ABNORMAL HIGH (ref 0.50–1.10)
Creatinine, Ser: 3.05 mg/dL — ABNORMAL HIGH (ref 0.50–1.10)
GFR calc Af Amer: 23 mL/min — ABNORMAL LOW (ref >90)
GFR calc Af Amer: 25 mL/min — ABNORMAL LOW (ref >90)
GFR calc non Af Amer: 20 mL/min — ABNORMAL LOW (ref >90)
GFR calc non Af Amer: 21 mL/min — ABNORMAL LOW (ref >90)
GFR, EST AFRICAN AMERICAN: 24 mL/min — AB (ref >90)
GFR, EST NON AFRICAN AMERICAN: 21 mL/min — AB (ref >90)
Glucose, Bld: 204 mg/dL — ABNORMAL HIGH (ref 70–99)
Glucose, Bld: 269 mg/dL — ABNORMAL HIGH (ref 70–99)
Glucose, Bld: 364 mg/dL — ABNORMAL HIGH (ref 70–99)
POTASSIUM: 3.4 meq/L — AB (ref 3.7–5.3)
Potassium: 3.3 mEq/L — ABNORMAL LOW (ref 3.7–5.3)
Potassium: 4.5 mEq/L (ref 3.7–5.3)
Sodium: 137 mEq/L (ref 137–147)
Sodium: 138 mEq/L (ref 137–147)
Sodium: 141 mEq/L (ref 137–147)

## 2014-01-23 LAB — PROCALCITONIN: Procalcitonin: 0.49 ng/mL

## 2014-01-23 LAB — GLUCOSE, CAPILLARY
GLUCOSE-CAPILLARY: 217 mg/dL — AB (ref 70–99)
GLUCOSE-CAPILLARY: 208 mg/dL — AB (ref 70–99)
GLUCOSE-CAPILLARY: 351 mg/dL — AB (ref 70–99)
Glucose-Capillary: 356 mg/dL — ABNORMAL HIGH (ref 70–99)

## 2014-01-23 MED ORDER — FENTANYL CITRATE 0.05 MG/ML IJ SOLN: 50.0000 ug | INTRAMUSCULAR | Status: DC | PRN

## 2014-01-23 MED ORDER — INSULIN ASPART 100 UNIT/ML ~~LOC~~ SOLN
0.0000 [IU] | Freq: Three times a day (TID) | SUBCUTANEOUS | Status: DC
  Administered 2014-01-23 (×2): 7 [IU] via SUBCUTANEOUS
  Administered 2014-01-24: 3 [IU] via SUBCUTANEOUS
  Administered 2014-01-24: 11 [IU] via SUBCUTANEOUS
  Administered 2014-01-24: 3 [IU] via SUBCUTANEOUS
  Administered 2014-01-25: 4 [IU] via SUBCUTANEOUS

## 2014-01-23 MED ORDER — SODIUM CHLORIDE 0.45 % IV SOLN
INTRAVENOUS | Status: DC
  Administered 2014-01-23 – 2014-01-24 (×2): via INTRAVENOUS

## 2014-01-23 MED ORDER — LEVOTHYROXINE SODIUM 75 MCG PO TABS: 75.0000 ug | ORAL_TABLET | Freq: Every day | ORAL | Status: DC

## 2014-01-23 MED ORDER — AMLODIPINE BESYLATE 10 MG PO TABS
10.0000 mg | ORAL_TABLET | Freq: Every day | ORAL | Status: DC
  Administered 2014-01-23 – 2014-01-24 (×2): 10 mg via ORAL
  Filled 2014-01-23 (×3): qty 1

## 2014-01-23 MED ORDER — INSULIN GLARGINE 100 UNIT/ML ~~LOC~~ SOLN: 24.0000 [IU] | Freq: Every day | SUBCUTANEOUS | Status: DC

## 2014-01-23 MED ORDER — INSULIN GLARGINE 100 UNIT/ML ~~LOC~~ SOLN
24.0000 [IU] | Freq: Every day | SUBCUTANEOUS | Status: DC
  Administered 2014-01-23: 24 [IU] via SUBCUTANEOUS
  Filled 2014-01-23 (×2): qty 0.24

## 2014-01-23 MED ORDER — INFLUENZA VAC SPLIT QUAD 0.5 ML IM SUSY
0.5000 mL | PREFILLED_SYRINGE | INTRAMUSCULAR | Status: AC
  Administered 2014-01-24: 0.5 mL via INTRAMUSCULAR
  Filled 2014-01-23: qty 0.5

## 2014-01-23 MED ORDER — LIVING WELL WITH DIABETES BOOK
Freq: Once | Status: AC
  Administered 2014-01-23: 08:00:00
  Filled 2014-01-23: qty 1

## 2014-01-23 MED ORDER — INSULIN ASPART 100 UNIT/ML ~~LOC~~ SOLN
0.0000 [IU] | Freq: Every day | SUBCUTANEOUS | Status: DC
  Administered 2014-01-23: 5 [IU] via SUBCUTANEOUS
  Administered 2014-01-24: 3 [IU] via SUBCUTANEOUS

## 2014-01-23 MED ORDER — ACYCLOVIR 400 MG PO TABS
400.0000 mg | ORAL_TABLET | Freq: Two times a day (BID) | ORAL | Status: DC
  Administered 2014-01-23 – 2014-01-24 (×4): 400 mg via ORAL
  Filled 2014-01-23 (×6): qty 1

## 2014-01-23 MED ORDER — LEVOTHYROXINE SODIUM 25 MCG PO TABS: 25.0000 ug | ORAL_TABLET | Freq: Every day | ORAL | Status: DC

## 2014-01-23 NOTE — Progress Notes (Signed)
Urinary catheter removed per nurse driven removal protocol and patient request. Patient spontaneously voided 200 mL urine. Will continue to monitor.

## 2014-01-23 NOTE — Progress Notes (Addendum)
[place] TEAM 1 - Stepdown/ICU TEAM Progress Note  SAROJ STANARD [id number] DOB: [birth year] DOA: 01/20/2014 PCP: [name]  Admit HPI / Brief Narrative: 28 year old female who presented to [facility] ED with altered mental status. She was in her usual state of health until 12/10 afternoon when she was witnessed by brother to be crying. He addressed her and she was noted to have slurred speech so they planned to bring her to ED. Prior to arrival she had one episode of vomiting, and seizure like activity. In the ED she was noted to have profound hyperglycemia and positive urine ketones. She had an additional seizure which was treated with ativan. Post ictally she was unable to protect her airway so she was intubated and placed on mechanical ventilator and subsequently ransferred to [facility] for ICU admission.   Significant Events:  12/10 DKA, seizure, intubated for airway protection 12/11 extubated   HPI/Subjective: Pt is alert and conversant.  She denies n/v, abdom pain, sob, or HA.  She is slightly lethargic.    Assessment/Plan:  DKA - uncontrolled DM1 with retinopathy and nephropathy Bicarb now normal - anion gap remains elevated likely due to other causes - CBG remains very poorly controlled - adjust insulin regimen and follow trend   Hypothyroidism TSH 9.980 - ?noncompliance w/ home meds - cont home tx dose for now   ? RTA  PCCM concerned about possible Type 4 RTA given long hx of uncontrolled DM - chem not presently c/w Type 4 but this could be affected by tx for DKA - follow - cont bicarb supplementation   Seizure likely 2/2 DKA - Keppra 14 days then dc - EEG shows metabolic changes   Acute renal failure on chronic kidney disease Stage 3 baseline Scr 1 year ago ~2.0  Uncontrolled HTN Resume home meds as able - follow   Hypokalemia Corrected - due to tx for DKA  Hypomagnesemia Recheck Mg in AM   Chronic anemia  likely due to menstrual loss as well as as CKD - check  anemia panel - transfuse prn Hb < 7.0  Code Status: FULL Family Communication: spoke w/ sister at bedside  Disposition Plan: SDU  Consultants: none  Procedures: none  Antibiotics: none  DVT prophylaxis: SQ heparin   Objective: Blood pressure 137/72, pulse 104, temperature 98.6 F (37 C), temperature source Oral, resp. rate 14, height 5' 2.99" (1.6 m), weight 64 kg (141 lb 1.5 oz), SpO2 100 %.  Intake/Output Summary (Last 24 hours) at 01/23/14 1015 Last data filed at 01/23/14 1000  Gross per 24 hour  Intake 2112.46 ml  Output   2000 ml  Net 112.46 ml   Exam: General: No acute respiratory distress Lungs: Clear to auscultation bilaterally without wheezes or crackles Cardiovascular: Regular rate and rhythm without gallop or rub - 3/6 systolic M over pulmonic area  Abdomen: Nontender, nondistended, soft, bowel sounds positive, no rebound, no ascites, no appreciable mass Extremities: No significant cyanosis, clubbing, or edema bilateral lower extremities  Data Reviewed: Basic Metabolic Panel:  Recent Labs Lab 01/20/14 2030  01/22/14 0248  01/22/14 1632 01/22/14 2000 01/22/14 2335 01/23/14 0230 01/23/14 0803  NA 139  < > 141  < > 140 141 138 141 137  K 3.5*  < > 3.6*  < > 3.8 3.5* 3.3* 3.4* 4.5  CL 105  < > 110  < > 106 107 103 103 99  CO2 17*  < > 16*  < > 19 22 23  21  20  GLUCOSE 164*  < > 189*  < > 228* 182* 204* 269* 364*  BUN 36*  < > 24*  < > 22 23 24* 24* 25*  CREATININE 2.95*  < > 3.19*  < > 3.01* 2.97* 3.05* 2.88* 2.83*  CALCIUM 8.8  < > 8.5  < > 8.5 8.4 8.3* 8.2* 8.9  MG 1.8  --  1.6  --   --   --   --   --   --   PHOS 2.0*  --  2.8  --   --   --   --   --   --   < > = values in this interval not displayed.  Liver Function Tests:  Recent Labs Lab 01/20/14 1140  AST 19  ALT 20  ALKPHOS 132*  BILITOT 0.4  PROT 7.6  ALBUMIN 3.6   CBC:  Recent Labs Lab 01/20/14 1140 01/20/14 2030 01/22/14 0248 01/23/14 0230  WBC 10.4 12.5* 10.2 6.3    NEUTROABS 9.1* 10.4*  --   --   HGB 9.3* 8.0* 7.2* 7.1*  HCT 27.0* 23.1* 21.6* 21.2*  MCV 82.1 79.4 82.4 82.5  PLT 151 191 158 133*   CBG:  Recent Labs Lab 01/22/14 1107 01/22/14 1216 01/22/14 1718 01/22/14 2202 01/23/14 0705  GLUCAP 128* 199* 230* 210* 356*    Recent Results (from the past 240 hour(s))  CSF culture     Status: None (Preliminary result)   Collection Time: 01/20/14  4:32 PM  Result Value Ref Range Status   Specimen Description CSF  Final   Special Requests IMMUNE:COMPROMISED  Final   Gram Stain   Final    NO WBC SEEN NO ORGANISMS SEEN CYTOSPIN Performed at [facility]    Culture   Final    NO GROWTH 1 DAY Performed at [facility]    Report Status PENDING  Incomplete  MRSA PCR Screening     Status: None   Collection Time: 01/20/14  8:37 PM  Result Value Ref Range Status   MRSA by PCR NEGATIVE NEGATIVE Final    Comment:        The GeneXpert MRSA Assay (FDA approved for NASAL specimens only), is one component of a comprehensive MRSA colonization surveillance program. It is not intended to diagnose MRSA infection nor to guide or monitor treatment for MRSA infections.      Studies:  Recent x-ray studies have been reviewed in detail by the Attending Physician  Scheduled Meds:  Scheduled Meds: . heparin  5,000 Units Subcutaneous 3 times per day  . [START ON 01/24/2014] Influenza vac split quadrivalent PF  0.5 mL Intramuscular Tomorrow-1000  . insulin aspart  0-15 Units Subcutaneous TID WC  . insulin aspart  0-5 Units Subcutaneous QHS  . insulin glargine  12 Units Subcutaneous Daily  . levothyroxine  100 mcg Oral QAC breakfast  . pantoprazole (PROTONIX) IV  40 mg Intravenous Daily    Time spent on care of this patient: 35 mins   Denielle Bayard T , MD   [name] Office  [phone] Pager - Text Page per Shea Evans as per below:  On-Call/Text Page:      [email or web address]      password [email or web address]  If 7PM-7AM, please  contact night-coverage [email or web address] Password [email or web address] 01/23/2014, 10:15 AM   LOS: 3 days

## 2014-01-24 LAB — HEPATIC FUNCTION PANEL
ALT: 10 U/L (ref 0–35)
AST: 11 U/L (ref 0–37)
Albumin: 2.4 g/dL — ABNORMAL LOW (ref 3.5–5.2)
Alkaline Phosphatase: 97 U/L (ref 39–117)
TOTAL PROTEIN: 5.8 g/dL — AB (ref 6.0–8.3)
Total Bilirubin: <0.2 mg/dL — ABNORMAL LOW (ref 0.3–1.2)

## 2014-01-24 LAB — GLUCOSE, CAPILLARY
GLUCOSE-CAPILLARY: 122 mg/dL — AB (ref 70–99)
GLUCOSE-CAPILLARY: 125 mg/dL — AB (ref 70–99)
GLUCOSE-CAPILLARY: 269 mg/dL — AB (ref 70–99)
Glucose-Capillary: 184 mg/dL — ABNORMAL HIGH (ref 70–99)
Glucose-Capillary: 256 mg/dL — ABNORMAL HIGH (ref 70–99)

## 2014-01-24 LAB — FOLATE: FOLATE: 5.8 ng/mL

## 2014-01-24 LAB — CSF CULTURE

## 2014-01-24 LAB — BASIC METABOLIC PANEL
Anion gap: 12 (ref 5–15)
BUN: 29 mg/dL — ABNORMAL HIGH (ref 6–23)
CHLORIDE: 102 meq/L (ref 96–112)
CO2: 26 mEq/L (ref 19–32)
Calcium: 8.8 mg/dL (ref 8.4–10.5)
Creatinine, Ser: 3.05 mg/dL — ABNORMAL HIGH (ref 0.50–1.10)
GFR calc Af Amer: 23 mL/min — ABNORMAL LOW (ref >90)
GFR calc non Af Amer: 20 mL/min — ABNORMAL LOW (ref >90)
GLUCOSE: 217 mg/dL — AB (ref 70–99)
Potassium: 3.3 mEq/L — ABNORMAL LOW (ref 3.7–5.3)
SODIUM: 140 meq/L (ref 137–147)

## 2014-01-24 LAB — IRON AND TIBC
IRON: 49 ug/dL (ref 42–135)
Saturation Ratios: 26 % (ref 20–55)
TIBC: 190 ug/dL — AB (ref 250–470)
UIBC: 141 ug/dL (ref 125–400)

## 2014-01-24 LAB — CSF CULTURE W GRAM STAIN
Culture: NO GROWTH
Gram Stain: NONE SEEN

## 2014-01-24 LAB — CBC
HEMATOCRIT: 23.7 % — AB (ref 36.0–46.0)
HEMOGLOBIN: 7.9 g/dL — AB (ref 12.0–15.0)
MCH: 27.5 pg (ref 26.0–34.0)
MCHC: 33.3 g/dL (ref 30.0–36.0)
MCV: 82.6 fL (ref 78.0–100.0)
Platelets: 161 10*3/uL (ref 150–400)
RBC: 2.87 MIL/uL — ABNORMAL LOW (ref 3.87–5.11)
RDW: 12.7 % (ref 11.5–15.5)
WBC: 7.3 10*3/uL (ref 4.0–10.5)

## 2014-01-24 LAB — CORTISOL: Cortisol, Plasma: 9 ug/dL

## 2014-01-24 LAB — RETICULOCYTES
RBC.: 2.87 MIL/uL — ABNORMAL LOW (ref 3.87–5.11)
RETIC COUNT ABSOLUTE: 51.7 10*3/uL (ref 19.0–186.0)
Retic Ct Pct: 1.8 % (ref 0.4–3.1)

## 2014-01-24 LAB — FERRITIN: Ferritin: 152 ng/mL (ref 10–291)

## 2014-01-24 LAB — VITAMIN B12: Vitamin B-12: 887 pg/mL (ref 211–911)

## 2014-01-24 MED ORDER — POTASSIUM CHLORIDE CRYS ER 20 MEQ PO TBCR: 40.0000 meq | EXTENDED_RELEASE_TABLET | Freq: Two times a day (BID) | ORAL | Status: DC

## 2014-01-24 MED ORDER — INSULIN GLARGINE 100 UNIT/ML ~~LOC~~ SOLN
28.0000 [IU] | Freq: Every day | SUBCUTANEOUS | Status: DC
  Administered 2014-01-24: 28 [IU] via SUBCUTANEOUS
  Filled 2014-01-24 (×2): qty 0.28

## 2014-01-24 MED ORDER — POTASSIUM CHLORIDE 20 MEQ/15ML (10%) PO SOLN
40.0000 meq | Freq: Two times a day (BID) | ORAL | Status: DC
  Administered 2014-01-24 (×2): 40 meq via ORAL
  Filled 2014-01-24 (×5): qty 30

## 2014-01-24 NOTE — Progress Notes (Addendum)
Inpatient Diabetes Program Recommendations  AACE/ADA: New Consensus Statement on Inpatient Glycemic Control (2013)  Target Ranges:  Prepandial:   less than 140 mg/dL      Peak postprandial:   less than 180 mg/dL (1-2 hours)      Critically ill patients:  140 - 180 mg/dL   Reason for Visit: Patient admitted with profound hyperglycemia/DKA.    Diabetes history: Type 1 diabetes Outpatient Diabetes medications: Lantus 24 units q HS, Humalog 1 unit for every 10 grams of CHO, 1 unit for every 30 mg/dL >120 mg/dL.  She reports that her last A1C at Dr. Liliane Channel was 7.1%.  Current orders for Inpatient glycemic control:  Lantus 24 units, Novolog resistant tid with meals and HS  Due to history of Type 1 diabetes, consider decreasing Novolog correction to sensitive tid with meals and HS.  Also consider adding Novolog meal coverage 4 units tid with meals (Hold if patient eats less than 50%).  Spoke to patient regarding what occurred prior to admit.  She states that she had checked her CBG in the morning and it was 107.  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  She states she did take her insulin and she states that her CBG's have been well controlled.  She see's endocrinologist in [place]. Will need close follow-up after discharge.  May also need new Rx's for insulin just to make sure her insulin was not bad?  Thanks, Adah Perl, RN, BC-ADM Inpatient Diabetes Coordinator Pager [phone]

## 2014-01-24 NOTE — Progress Notes (Signed)
[place] TEAM 1 - Stepdown/ICU TEAM Progress Note  SA FEHRMAN [id number] DOB: 07-May-1985 DOA: 01/20/2014 PCP: [name]  Admit HPI / Brief Narrative: 28 year old female who presented to [facility] ED with altered mental status. She was in her usual state of health until 12/10 afternoon when she was witnessed by brother to be crying. He addressed her and she was noted to have slurred speech so they planned to bring her to ED. Prior to arrival she had one episode of vomiting, and seizure like activity. In the ED she was noted to have profound hyperglycemia and positive urine ketones. She had an additional seizure which was treated with ativan. Post ictally she was unable to protect her airway so she was intubated and placed on mechanical ventilator and subsequently ransferred to [facility] for ICU admission.   Significant Events:  12/10 DKA, seizure, intubated for airway protection 12/11 extubated   HPI/Subjective: No complaints today.  Highly motivated to be d/c home.  Denies cp, sob, n/v, or abdom pain.    Assessment/Plan:  DKA - uncontrolled DM1 with retinopathy and nephropathy Bicarb now normal - anion gap remains elevated likely due to other causes - CBG remains poorly controlled but is improving - adjust insulin regimen again and follow trend   Hypothyroidism TSH 9.980 - ?noncompliance w/ home meds - cont home tx dose for now - will need recheck of TSH in 6-8 weeks   ? RTA  PCCM concerned about possible Type 4 RTA given long hx of uncontrolled DM - chem not presently c/w Type 4 but this could be affected by tx for DKA - follow - stopped bicarb supplementation as bicarb currently high normal and follow trend   Seizure likely 2/2 DKA - Keppra 14 days then dc - EEG shows metabolic changes only  Acute renal failure on chronic kidney disease Stage 3 baseline Scr 1 year ago ~2.0 - crt currently climbing, at peak thus far of 3.05 - need to assure plateaus or improves prior to d/c    Uncontrolled HTN Resume home meds as able - follow - further adjustment may be required   Hypokalemia Correct as needed - due to tx for DKA  Hypomagnesemia Recheck Mg in AM   Chronic anemia  likely due to menstrual loss as well as as CKD - anemia panel most c/w ACD - transfuse prn Hb < 7.0  Systolic M Pt reports no hx of prior M - likely simply related to DH/DKA - follow clinically - if persists, consider TTE for eval   Code Status: FULL Family Communication: spoke w/ family member at bedside   Disposition Plan: transfer to med bed - follow CBGs, lytes, bicarb, and BP  Consultants: none  Procedures: none  Antibiotics: none  DVT prophylaxis: SQ heparin   Objective: Blood pressure 154/84, pulse 85, temperature 98.2 F (36.8 C), temperature source Oral, resp. rate 12, height 5' 2.99" (1.6 m), weight 64 kg (141 lb 1.5 oz), SpO2 100 %.  Intake/Output Summary (Last 24 hours) at 01/24/14 1146 Last data filed at 01/24/14 0800  Gross per 24 hour  Intake   1050 ml  Output    930 ml  Net    120 ml   Exam: General: No acute respiratory distress Lungs: Clear to auscultation bilaterally without wheezes or crackles Cardiovascular: Regular rate and rhythm without gallop or rub - 3/6 systolic M over pulmonic area less pronounced today  Abdomen: Nontender, nondistended, soft, bowel sounds positive, no rebound, no ascites,  no appreciable mass Extremities: No significant cyanosis, clubbing, or edema bilateral lower extremities  Data Reviewed: Basic Metabolic Panel:  Recent Labs Lab 01/20/14 2030  01/22/14 0248  01/22/14 2000 01/22/14 2335 01/23/14 0230 01/23/14 0803 01/24/14 0220  NA 139  < > 141  < > 141 138 141 137 140  K 3.5*  < > 3.6*  < > 3.5* 3.3* 3.4* 4.5 3.3*  CL 105  < > 110  < > 107 103 103 99 102  CO2 17*  < > 16*  < > 22 23 21 20 26   GLUCOSE 164*  < > 189*  < > 182* 204* 269* 364* 217*  BUN 36*  < > 24*  < > 23 24* 24* 25* 29*  CREATININE 2.95*  < >  3.19*  < > 2.97* 3.05* 2.88* 2.83* 3.05*  CALCIUM 8.8  < > 8.5  < > 8.4 8.3* 8.2* 8.9 8.8  MG 1.8  --  1.6  --   --   --   --   --   --   PHOS 2.0*  --  2.8  --   --   --   --   --   --   < > = values in this interval not displayed.  Liver Function Tests:  Recent Labs Lab 01/20/14 1140 01/24/14 0220  AST 19 11  ALT 20 10  ALKPHOS 132* 97  BILITOT 0.4 <0.2*  PROT 7.6 5.8*  ALBUMIN 3.6 2.4*   CBC:  Recent Labs Lab 01/20/14 1140 01/20/14 2030 01/22/14 0248 01/23/14 0230 01/24/14 0220  WBC 10.4 12.5* 10.2 6.3 7.3  NEUTROABS 9.1* 10.4*  --   --   --   HGB 9.3* 8.0* 7.2* 7.1* 7.9*  HCT 27.0* 23.1* 21.6* 21.2* 23.7*  MCV 82.1 79.4 82.4 82.5 82.6  PLT 151 191 158 133* 161   CBG:  Recent Labs Lab 01/23/14 0705 01/23/14 1134 01/23/14 1608 01/23/14 2148 01/24/14 0304  GLUCAP 356* 217* 208* 351* 184*    Recent Results (from the past 240 hour(s))  CSF culture     Status: None (Preliminary result)   Collection Time: 01/20/14  4:32 PM  Result Value Ref Range Status   Specimen Description CSF  Final   Special Requests IMMUNE:COMPROMISED  Final   Gram Stain   Final    NO WBC SEEN NO ORGANISMS SEEN CYTOSPIN Performed at [facility]    Culture   Final    NO GROWTH 2 DAYS Performed at [facility]    Report Status PENDING  Incomplete  MRSA PCR Screening     Status: None   Collection Time: 01/20/14  8:37 PM  Result Value Ref Range Status   MRSA by PCR NEGATIVE NEGATIVE Final    Comment:        The GeneXpert MRSA Assay (FDA approved for NASAL specimens only), is one component of a comprehensive MRSA colonization surveillance program. It is not intended to diagnose MRSA infection nor to guide or monitor treatment for MRSA infections.      Studies:  Recent x-ray studies have been reviewed in detail by the Attending Physician  Scheduled Meds:  Scheduled Meds: . acyclovir  400 mg Oral BID  . amLODipine  10 mg Oral Daily  . heparin   5,000 Units Subcutaneous 3 times per day  . Influenza vac split quadrivalent PF  0.5 mL Intramuscular Tomorrow-1000  . insulin aspart  0-20 Units Subcutaneous TID WC  . insulin  aspart  0-5 Units Subcutaneous QHS  . insulin glargine  24 Units Subcutaneous QHS  . levothyroxine  100 mcg Oral QAC breakfast    Time spent on care of this patient: 35 mins   Dinh Ayotte T , MD   [name] Office  [phone] Pager - Text Page per Shea Evans as per below:  On-Call/Text Page:      [email or web address]      password [email or web address]  If 7PM-7AM, please contact night-coverage [email or web address] Password [email or web address] 01/24/2014, 11:46 AM   LOS: 4 days

## 2014-01-24 NOTE — Progress Notes (Signed)
Notified Baltazar Najjar, NP that pt states her IV site is painful, requesting it out. Pt states that she may go home in the morning. NP gave order to d/c IV and leave IV out. Will continue to monitor. Ranelle Oyster, RN

## 2014-01-24 NOTE — Progress Notes (Signed)
Patient transferred to [address] 32 via wheelchair.  VSS.

## 2014-01-25 DIAGNOSIS — E10359 Type 1 diabetes mellitus with proliferative diabetic retinopathy without macular edema: Principal | ICD-10-CM

## 2014-01-25 LAB — GLUCOSE, CAPILLARY: Glucose-Capillary: 173 mg/dL — ABNORMAL HIGH (ref 70–99)

## 2014-01-25 LAB — BASIC METABOLIC PANEL
Anion gap: 11 (ref 5–15)
BUN: 28 mg/dL — ABNORMAL HIGH (ref 6–23)
CO2: 26 mEq/L (ref 19–32)
CREATININE: 2.84 mg/dL — AB (ref 0.50–1.10)
Calcium: 9.3 mg/dL (ref 8.4–10.5)
Chloride: 104 mEq/L (ref 96–112)
GFR, EST AFRICAN AMERICAN: 25 mL/min — AB (ref >90)
GFR, EST NON AFRICAN AMERICAN: 21 mL/min — AB (ref >90)
Glucose, Bld: 178 mg/dL — ABNORMAL HIGH (ref 70–99)
Potassium: 4.5 mEq/L (ref 3.7–5.3)
Sodium: 141 mEq/L (ref 137–147)

## 2014-01-25 LAB — MAGNESIUM: MAGNESIUM: 2.3 mg/dL (ref 1.5–2.5)

## 2014-01-25 MED ORDER — LEVOTHYROXINE SODIUM 88 MCG PO TABS: 88.0000 ug | ORAL_TABLET | Freq: Every day | ORAL | Status: DC

## 2014-01-25 NOTE — Discharge Summary (Signed)
Physician Discharge Summary  Tracy [name] [id number] DOB: [birth year] DOA: 01/20/2014  PCP: [name]  Admit date: 01/20/2014 Discharge date: 01/25/2014  Time spent: 40 minutes  Recommendations for Outpatient Follow-up:   Follow-up with primary care physician/endocrinologist within one week.   Needs close follow-up for renal function and anemia as outpatient, check CBC and BMP in 1 week.  Discharge Diagnoses:  Principal Problem:   DKA (diabetic ketoacidoses) Active Problems:   Proliferative diabetic retinopathy   Acute encephalopathy   Acute on chronic renal failure   Seizure   Vomiting   Lactic acid acidosis   Acute respiratory failure   Discharge Condition: Stable  Diet recommendation: Carb modified diet  Filed Weights   01/20/14 1704 01/20/14 2000  Weight: 63.504 kg (140 lb) 64 kg (141 lb 1.5 oz)    History of present illness:  28 year old female who presented to [facility] ED with altered mental status. She was in her usual state of health until 12/10 afternoon when she was witnessed by brother to be crying. He addressed her and she was noted to have slurred speech so they planned to bring her to ED. Prior to arrival she had one episode of vomiting, and seizure like activity. In the ED she was noted to have profound hyperglycemia and positive urine ketones. She had an additional seizure which was treated with ativan. Post ictally she was unable to protect her airway so she was intubated and placed on mechanical ventilator and subsequently ransferred to [facility] for ICU admission.  Hospital Course:   DKA - uncontrolled DM1 with retinopathy and nephropathy Patient presented with glucose of 1004 and bicarbonate of 44 and anion gap of 34. Initial admitted to the ICU started on intensive insulin therapy, intubated for airway protection. Improved, this is resolved now, unclear why patient developed DKA. Patient reported she takes her insulin, no evidence of infection or  other physiological stressors. Discharged on same dosage of Lantus insulin and NovoLog, To follow-up with her endocrinologist.  Hypothyroidism TSH 9.9, patient takes 75 g of Synthroid, increased to 88 g. Follow-up with primary endocrinologist, recommend to check TSH in 6 weeks.  Seizure Likely secondary to DKA, hyperosmolarity with blood sugar of 1004. EEG was done and showed findings consistent with metabolic changes, no indication for antiepileptics.  Acute renal failure on chronic kidney disease Stage 3 Baseline creatinine was 1 about a year ago, presented with creatinine of 3.5. Creatinine improved to 2.8 on discharge.  Uncontrolled HTN Home medication resumed, blood pressure looks better, still not at range of  <130/80. Follow-up as outpatient.  Hypokalemia Correct as needed - due to tx for DKA  Hypomagnesemia Repleted with parenteral supplements.  Chronic anemia  likely due to  chronic kidney disease, follow closely as outpatient with CBC.  Systolic Murmur Pt reports no hx of prior murmur, likely innocent murmur secondary to the anemia.  Procedures:  Intubation on presentation,  and extubation on 12/11.  Consultations:  Was under PCCM till 12/13 [facility] hospitalist took over the care.  Discharge Exam: Filed Vitals:   01/25/14 0627  BP: 149/83  Pulse: 87  Temp: 98.4 F (36.9 C)  Resp: 15   General: Alert and awake, oriented x3, not in any acute distress. HEENT: anicteric sclera, pupils reactive to light and accommodation, EOMI CVS: S1-S2 clear, no murmur rubs or gallops Chest: clear to auscultation bilaterally, no wheezing, rales or rhonchi Abdomen: soft nontender, nondistended, normal bowel sounds, no organomegaly Extremities: no cyanosis, clubbing or edema noted  bilaterally Neuro: Cranial nerves II-XII intact, no focal neurological deficits  Discharge Instructions You were cared for by a hospitalist during your hospital stay. If you have any questions  about your discharge medications or the care you received while you were in the hospital after you are discharged, you can call the unit and asked to speak with the hospitalist on call if the hospitalist that took care of you is not available. Once you are discharged, your primary care physician will handle any further medical issues. Please note that NO REFILLS for any discharge medications will be authorized once you are discharged, as it is imperative that you return to your primary care physician (or establish a relationship with a primary care physician if you do not have one) for your aftercare needs so that they can reassess your need for medications and monitor your lab values.  Discharge Instructions    Diet Carb Modified    Complete by:  As directed      Increase activity slowly    Complete by:  As directed           Current Discharge Medication List    CONTINUE these medications which have CHANGED   Details  levothyroxine (SYNTHROID, LEVOTHROID) 88 MCG tablet Take 1 tablet (88 mcg total) by mouth daily before breakfast. Qty: 30 tablet, Refills: 0      CONTINUE these medications which have NOT CHANGED   Details  acyclovir (ZOVIRAX) 400 MG tablet Take 400 mg by mouth 2 (two) times daily.    amLODipine (NORVASC) 10 MG tablet Take 10 mg by mouth daily.    cyclobenzaprine (FLEXERIL) 10 MG tablet Take 10 mg by mouth at bedtime as needed.     furosemide (LASIX) 20 MG tablet Take 20 mg by mouth 2 (two) times daily.    HYDROcodone-acetaminophen (NORCO/VICODIN) 5-325 MG per tablet Take 1 tablet by mouth every 4 (four) hours as needed (pain).    insulin aspart (NOVOLOG FLEXPEN) 100 UNIT/ML FlexPen Inject into the skin 3 (three) times daily with meals.    Insulin Glargine (LANTUS SOLOSTAR) 100 UNIT/ML SOPN Inject 28 Units into the skin at bedtime.    medroxyPROGESTERone (DEPO-PROVERA) 150 MG/ML injection USE AS DIRECTED Qty: 1 mL, Refills: 4    Multiple Vitamins-Minerals  (MULTI-VITAMIN GUMMIES PO) Take by mouth daily.    lisinopril (PRINIVIL,ZESTRIL) 10 MG tablet Take 10 mg by mouth daily.       Allergies  Allergen Reactions  . Penicillins Hives and Swelling  . Sulfa Antibiotics Hives   Follow-up Information    Follow up with NIDA,GEBRESELASSIE, MD In 1 week.   Specialty:  Endocrinology   Contact information:   [place] [address] [phone]        The results of significant diagnostics from this hospitalization (including imaging, microbiology, ancillary and laboratory) are listed below for reference.    Significant Diagnostic Studies: Ct Head Wo Contrast  01/20/2014   CLINICAL DATA:  Seizure.  Altered mental status.  EXAM: CT HEAD WITHOUT CONTRAST  TECHNIQUE: Contiguous axial images were obtained from the base of the skull through the vertex without intravenous contrast.  COMPARISON:  CT scan dated 01/18/2013  FINDINGS: No mass lesion. No midline shift. No acute hemorrhage or hematoma. No extra-axial fluid collections. No evidence of acute infarction. Brain parenchyma is normal. Osseous structures are normal.  IMPRESSION: Normal exam.   Electronically Signed   By: Rozetta Nunnery M.D.   On: 01/20/2014 13:53   Dg  Chest Port 1 View  01/21/2014   CLINICAL DATA:  28 year old female with respiratory failure, intubated. Endotracheal tube position. Initial encounter.  EXAM: PORTABLE CHEST - 1 VIEW  COMPARISON:  01/20/2014 and earlier.  FINDINGS: Portable AP semi upright view at 0514 hrs. Endotracheal tube tip in good position just below the clavicles. Enteric tube is stable, side hole projects over the gastric fundus. Normal cardiac size and mediastinal contours. Stable lung volumes and ventilation. No confluent pulmonary opacity, pneumothorax or pleural effusion identified.  IMPRESSION: 1. Stable and satisfactory lines and tubes. 2.  No acute cardiopulmonary abnormality.   Electronically Signed   By: Lars Pinks M.D.   On: 01/21/2014 07:37    Dg Chest Port 1 View  01/20/2014   CLINICAL DATA:  Seizure  EXAM: PORTABLE CHEST - 1 VIEW  COMPARISON:  01/18/2013  FINDINGS: The heart size and mediastinal contours are within normal limits. Both lungs are clear. The visualized skeletal structures are unremarkable.  IMPRESSION: No active disease.   Electronically Signed   By: Franchot Gallo M.D.   On: 01/20/2014 12:38   Dg Chest Port 1v Same Day  01/20/2014   CLINICAL DATA:  28 year old female intubated for respiratory failure. Initial encounter.  EXAM: PORTABLE CHEST - 1 VIEW SAME DAY  COMPARISON:  1204 hr the same day and earlier.  FINDINGS: Portable AP supine view at 1558 hrs. The patient is rotated to the right. Endotracheal tube in place, tip projects 2 cm above the carina. Enteric tube placed, side hole the level of the gastric body. Normal cardiac size and mediastinal contours. Stable lung volumes. Allowing for portable technique, the lungs are clear. No pneumothorax or pleural effusion.  IMPRESSION: 1. ET tube and enteric tube appropriately placed. 2.  No acute cardiopulmonary abnormality.   Electronically Signed   By: Lars Pinks M.D.   On: 01/20/2014 16:05    Microbiology: Recent Results (from the past 240 hour(s))  CSF culture     Status: None   Collection Time: 01/20/14  4:32 PM  Result Value Ref Range Status   Specimen Description CSF  Final   Special Requests IMMUNE:COMPROMISED  Final   Gram Stain   Final    NO WBC SEEN NO ORGANISMS SEEN CYTOSPIN Performed at [facility]    Culture   Final    NO GROWTH 3 DAYS Performed at [facility]    Report Status 01/24/2014 FINAL  Final  MRSA PCR Screening     Status: None   Collection Time: 01/20/14  8:37 PM  Result Value Ref Range Status   MRSA by PCR NEGATIVE NEGATIVE Final    Comment:        The GeneXpert MRSA Assay (FDA approved for NASAL specimens only), is one component of a comprehensive MRSA colonization surveillance program. It is not intended to  diagnose MRSA infection nor to guide or monitor treatment for MRSA infections.      Labs: Basic Metabolic Panel:  Recent Labs Lab 01/20/14 2030  01/22/14 0248  01/22/14 2335 01/23/14 0230 01/23/14 0803 01/24/14 0220 01/25/14 0651  NA 139  < > 141  < > 138 141 137 140 141  K 3.5*  < > 3.6*  < > 3.3* 3.4* 4.5 3.3* 4.5  CL 105  < > 110  < > 103 103 99 102 104  CO2 17*  < > 16*  < > 23 21 20 26 26   GLUCOSE 164*  < > 189*  < > 204*  269* 364* 217* 178*  BUN 36*  < > 24*  < > 24* 24* 25* 29* 28*  CREATININE 2.95*  < > 3.19*  < > 3.05* 2.88* 2.83* 3.05* 2.84*  CALCIUM 8.8  < > 8.5  < > 8.3* 8.2* 8.9 8.8 9.3  MG 1.8  --  1.6  --   --   --   --   --  2.3  PHOS 2.0*  --  2.8  --   --   --   --   --   --   < > = values in this interval not displayed. Liver Function Tests:  Recent Labs Lab 01/20/14 1140 01/24/14 0220  AST 19 11  ALT 20 10  ALKPHOS 132* 97  BILITOT 0.4 <0.2*  PROT 7.6 5.8*  ALBUMIN 3.6 2.4*   No results for input(s): LIPASE, AMYLASE in the last 168 hours. No results for input(s): AMMONIA in the last 168 hours. CBC:  Recent Labs Lab 01/20/14 1140 01/20/14 2030 01/22/14 0248 01/23/14 0230 01/24/14 0220  WBC 10.4 12.5* 10.2 6.3 7.3  NEUTROABS 9.1* 10.4*  --   --   --   HGB 9.3* 8.0* 7.2* 7.1* 7.9*  HCT 27.0* 23.1* 21.6* 21.2* 23.7*  MCV 82.1 79.4 82.4 82.5 82.6  PLT 151 191 158 133* 161   Cardiac Enzymes: No results for input(s): CKTOTAL, CKMB, CKMBINDEX, TROPONINI in the last 168 hours. BNP: BNP (last 3 results) No results for input(s): PROBNP in the last 8760 hours. CBG:  Recent Labs Lab 01/24/14 0752 01/24/14 1203 01/24/14 1706 01/24/14 2125 01/25/14 0757  GLUCAP 122* 125* 256* 269* 173*       Signed:  Tyreanna Bisesi A  [name] 01/25/2014, 9:51 AM

## 2014-01-25 NOTE — Progress Notes (Signed)
Patient discharge teaching given, including activity, diet, follow-up appoints, and medications. Patient verbalized understanding of all discharge instructions. IV access was d/c'd. Vitals are stable. Skin is intact except as charted in most recent assessments. Pt to be escorted out by NT, to be driven home by family. 

## 2014-03-03 ENCOUNTER — Telehealth: Payer: Self-pay | Admitting: Obstetrics & Gynecology

## 2014-03-03 ENCOUNTER — Telehealth: Payer: Self-pay | Admitting: *Deleted

## 2014-03-03 NOTE — Telephone Encounter (Signed)
Pt states having clots with this period, has never had clots like this. Offered to schedule an appt, pt states will wait it out if no improvement will call office back on Monday.

## 2014-03-03 NOTE — Telephone Encounter (Signed)
Pt states couple of months ago was diagnosed with DKA, could that have effected her periods. Informed pt based on my knowledge I did not believe that would be effecting her period now. Pt verbalized understanding.

## 2014-03-14 ENCOUNTER — Ambulatory Visit: Payer: Self-pay

## 2014-03-21 ENCOUNTER — Ambulatory Visit: Payer: Self-pay

## 2014-06-30 ENCOUNTER — Other Ambulatory Visit: Payer: Self-pay | Admitting: Adult Health

## 2014-07-05 ENCOUNTER — Other Ambulatory Visit (HOSPITAL_COMMUNITY)
Admission: RE | Admit: 2014-07-05 | Discharge: 2014-07-05 | Disposition: A | Discharge status: Home / Self Care | Payer: BC Managed Care – PPO | Source: Ambulatory Visit | Attending: Obstetrics and Gynecology | Admitting: Obstetrics and Gynecology

## 2014-07-05 ENCOUNTER — Ambulatory Visit (INDEPENDENT_AMBULATORY_CARE_PROVIDER_SITE_OTHER): Payer: BLUE CROSS/BLUE SHIELD | Admitting: Adult Health

## 2014-07-05 ENCOUNTER — Encounter: Payer: Self-pay | Admitting: Adult Health

## 2014-07-05 VITALS — BP 150/90 | HR 84 | Ht 64.25 in | Wt 147.5 lb

## 2014-07-05 DIAGNOSIS — Z01419 Encounter for gynecological examination (general) (routine) without abnormal findings: Secondary | ICD-10-CM | POA: Insufficient documentation

## 2014-07-05 DIAGNOSIS — B379 Candidiasis, unspecified: Secondary | ICD-10-CM | POA: Insufficient documentation

## 2014-07-05 DIAGNOSIS — Z1151 Encounter for screening for human papillomavirus (HPV): Secondary | ICD-10-CM | POA: Diagnosis present

## 2014-07-05 DIAGNOSIS — N926 Irregular menstruation, unspecified: Secondary | ICD-10-CM

## 2014-07-05 DIAGNOSIS — N898 Other specified noninflammatory disorders of vagina: Secondary | ICD-10-CM

## 2014-07-05 HISTORY — DX: Other specified noninflammatory disorders of vagina: N89.8

## 2014-07-05 HISTORY — DX: Irregular menstruation, unspecified: N92.6

## 2014-07-05 LAB — POCT WET PREP (WET MOUNT): WBC, Wet Prep HPF POC: POSITIVE

## 2014-07-05 MED ORDER — FLUCONAZOLE 150 MG PO TABS: ORAL_TABLET | ORAL | Status: DC

## 2014-07-05 NOTE — Patient Instructions (Signed)
Physical in 1 year Call if no period by end of June

## 2014-07-05 NOTE — Progress Notes (Signed)
Patient ID: Tracy [name], female   DOB: [birth year], 29 y.o.   MRN: [id number] History of Present Illness: Tracy [name] is a 29 year old white female, in for well woman gyn exam and pap.She complains of vaginal discharge and no period since stopping depo in March, she had irregular bleeding in February. She sees the [facility] in [place].  Current Medications, Allergies, Past Medical History, Past Surgical History, Family History and Social History were reviewed in [employer] record.     Review of Systems:  Patient denies any headaches, hearing loss, fatigue, blurred vision, shortness of breath, chest pain, abdominal pain, problems with bowel movements, urination, or intercourse. No joint pain or mood swings.Partner has vasectomy.   Physical Exam:BP 150/90 mmHg  Pulse 84  Ht 5' 4.25" (1.632 m)  Wt 147 lb 8 oz (66.906 kg)  BMI 25.12 kg/m2 General:  Well developed, well nourished, no acute distress Skin:  Warm and dry Neck:  Midline trachea,  Thyroid enlarged R>L, has appt with doctor at [facility] for F/U, good ROM, no lymphadenopathy Lungs; Clear to auscultation bilaterally Breast:  No dominant palpable mass, retraction, or nipple discharge Cardiovascular: Regular rate and rhythm Abdomen:  Soft, non tender, no hepatosplenomegaly Pelvic:  External genitalia is normal in appearance, no lesions.  The vagina has clumpy greenish discharge. Urethra has no lesions or masses. The cervix is smooth, pap with HPV performed.  Uterus is felt to be normal size, shape, and contour.  No adnexal masses or tenderness noted.Bladder is non tender, no masses felt. Wet prep +WBC and yeast Extremities/musculoskeletal:  No swelling or varicosities noted, no clubbing or cyanosis Psych:  No mood changes, alert and cooperative,seems happy Discussed that is not unusual for periods to be irregular after stopping depo, but cal if no period by end of June.  Impression: Well woman gyn  exam with pap Vaginal discharge  Yeast Irregular periods    Plan: Rx diflucan 150 mg #2 take 1 now and 1 in 3 days with 1 refill Physical in 1 year  Call if no period by end of June

## 2014-07-07 LAB — CYTOLOGY - PAP

## 2014-08-05 ENCOUNTER — Emergency Department (HOSPITAL_COMMUNITY): Payer: BLUE CROSS/BLUE SHIELD

## 2014-08-05 ENCOUNTER — Emergency Department (HOSPITAL_COMMUNITY)
Admission: EM | Admit: 2014-08-05 | Discharge: 2014-08-05 | Disposition: A | Discharge status: Home / Self Care | Payer: BLUE CROSS/BLUE SHIELD | Attending: Emergency Medicine | Admitting: Emergency Medicine

## 2014-08-05 ENCOUNTER — Encounter (HOSPITAL_COMMUNITY): Payer: Self-pay | Admitting: Emergency Medicine

## 2014-08-05 DIAGNOSIS — Z794 Long term (current) use of insulin: Secondary | ICD-10-CM | POA: Diagnosis not present

## 2014-08-05 DIAGNOSIS — Z8669 Personal history of other diseases of the nervous system and sense organs: Secondary | ICD-10-CM | POA: Diagnosis not present

## 2014-08-05 DIAGNOSIS — Z862 Personal history of diseases of the blood and blood-forming organs and certain disorders involving the immune mechanism: Secondary | ICD-10-CM | POA: Insufficient documentation

## 2014-08-05 DIAGNOSIS — Z8619 Personal history of other infectious and parasitic diseases: Secondary | ICD-10-CM | POA: Insufficient documentation

## 2014-08-05 DIAGNOSIS — Z8659 Personal history of other mental and behavioral disorders: Secondary | ICD-10-CM | POA: Diagnosis not present

## 2014-08-05 DIAGNOSIS — I10 Essential (primary) hypertension: Secondary | ICD-10-CM | POA: Diagnosis not present

## 2014-08-05 DIAGNOSIS — E039 Hypothyroidism, unspecified: Secondary | ICD-10-CM | POA: Diagnosis not present

## 2014-08-05 DIAGNOSIS — M779 Enthesopathy, unspecified: Secondary | ICD-10-CM | POA: Insufficient documentation

## 2014-08-05 DIAGNOSIS — Z79899 Other long term (current) drug therapy: Secondary | ICD-10-CM | POA: Insufficient documentation

## 2014-08-05 DIAGNOSIS — Z87440 Personal history of urinary (tract) infections: Secondary | ICD-10-CM | POA: Insufficient documentation

## 2014-08-05 DIAGNOSIS — Z8742 Personal history of other diseases of the female genital tract: Secondary | ICD-10-CM | POA: Insufficient documentation

## 2014-08-05 DIAGNOSIS — E049 Nontoxic goiter, unspecified: Secondary | ICD-10-CM | POA: Insufficient documentation

## 2014-08-05 DIAGNOSIS — E119 Type 2 diabetes mellitus without complications: Secondary | ICD-10-CM | POA: Insufficient documentation

## 2014-08-05 DIAGNOSIS — Z880 Allergy status to penicillin: Secondary | ICD-10-CM | POA: Diagnosis not present

## 2014-08-05 DIAGNOSIS — M79601 Pain in right arm: Secondary | ICD-10-CM | POA: Diagnosis present

## 2014-08-05 LAB — CBG MONITORING, ED: Glucose-Capillary: 195 mg/dL — ABNORMAL HIGH (ref 65–99)

## 2014-08-05 MED ORDER — HYDROCODONE-ACETAMINOPHEN 5-325 MG PO TABS: 2.0000 | ORAL_TABLET | ORAL | Status: DC | PRN

## 2014-08-05 MED ORDER — KETOROLAC TROMETHAMINE 60 MG/2ML IM SOLN
60.0000 mg | Freq: Once | INTRAMUSCULAR | Status: AC
  Administered 2014-08-05: 60 mg via INTRAMUSCULAR
  Filled 2014-08-05: qty 2

## 2014-08-05 MED ORDER — IBUPROFEN 800 MG PO TABS: 800.0000 mg | ORAL_TABLET | Freq: Three times a day (TID) | ORAL | Status: DC

## 2014-08-05 MED ORDER — OXYCODONE-ACETAMINOPHEN 5-325 MG PO TABS
1.0000 | ORAL_TABLET | Freq: Once | ORAL | Status: AC
  Administered 2014-08-05: 1 via ORAL
  Filled 2014-08-05: qty 1

## 2014-08-05 NOTE — ED Notes (Signed)
Patient states she picked up a heavy bag of horse feed last Wednesday and started experiencing pain in upper right arm on Friday. States pain radiates into right shoulder and right hand. Also complaining of swelling in upper right arm.

## 2014-08-05 NOTE — ED Provider Notes (Addendum)
CSN: [id number]     Arrival date & time 08/05/14  0719 History   First MD Initiated Contact with Patient 08/05/14 [phone]     Chief Complaint  Patient presents with  . Arm Pain     (Consider location/radiation/quality/duration/timing/severity/associated sxs/prior Treatment) HPI Comments: Patient presents to the emergency department for evaluation of right arm pain. Patient reports that she has been experiencing pain around the right elbow and upper arm for more than a week. Patient is unaware of any direct injury, but it started after picking up heavy bag of worse feet. She reports that that event occurred earlier in the day, she did not notice any specific pain when she picked up the back, but thinking back has consider that as possible cause.  She has been applying ice and using ibuprofen. She reports that pain is worsening rather than improving. There was a fair amount of swelling earlier in the week, swelling seems to have gone down. Patient denies any numbness, tingling or color change.  Patient is a 29 y.o. female presenting with arm pain.  Arm Pain    Past Medical History  Diagnosis Date  . Thyroid enlargement     "not on medication at this time"  . Urinary tract infection     hx of  . Anemia     presently on iron supplement  . Diabetes mellitus     insulin pump   . Hypothyroidism   . Anxiety   . HSV-2 (herpes simplex virus 2) infection   . HSV-1 (herpes simplex virus 1) infection   . Detached retina   . Yeast infection     took diflucan saturday   . Hypertension   . Irregular periods 07/05/2014  . Vaginal discharge 07/05/2014   Past Surgical History  Procedure Laterality Date  . Cholecystectomy    . Pilonidal cyst excision    . Wisdom tooth extraction    . Pars plana vitrectomy  10/01/2011    Procedure: PARS PLANA VITRECTOMY WITH 25 GAUGE;  Surgeon: Hayden Pedro, MD;  Location: [facility];  Service: Ophthalmology;  Laterality: Right;  Repair Complex Traction Retinal  Detachment  . Trigger finger release Right 09/21/13   Family History  Problem Relation Age of Onset  . Cancer Paternal Grandfather     prostate  . Hyperlipidemia Paternal Grandfather   . Stroke Paternal Grandfather    History  Substance Use Topics  . Smoking status: Never Smoker   . Smokeless tobacco: Never Used  . Alcohol Use: No   OB History    Gravida Para Term Preterm AB TAB SAB Ectopic Multiple Living   2    2  2         Review of Systems  Musculoskeletal: Positive for arthralgias.  All other systems reviewed and are negative.     Allergies  Penicillins and Sulfa antibiotics  Home Medications   Prior to Admission medications   Medication Sig Start Date End Date Taking? Authorizing Provider  acyclovir (ZOVIRAX) 400 MG tablet Take 400 mg by mouth 2 (two) times daily as needed.     Historical Provider, MD  amLODipine (NORVASC) 10 MG tablet Take 10 mg by mouth daily.    Historical Provider, MD  fluconazole (DIFLUCAN) 150 MG tablet Take 1 now and 1 in 3 days 07/05/14   Estill Dooms, NP  furosemide (LASIX) 20 MG tablet Take 20 mg by mouth 2 (two) times daily as needed.     Historical Provider, MD  insulin  aspart (NOVOLOG FLEXPEN) 100 UNIT/ML FlexPen Inject 5 Units into the skin 3 (three) times daily with meals.     Historical Provider, MD  Insulin Glargine (LANTUS SOLOSTAR) 100 UNIT/ML SOPN Inject 28 Units into the skin at bedtime. Patient taking differently: Inject 15 Units into the skin every morning.  01/22/13   Samuella Cota, MD  levothyroxine (SYNTHROID, LEVOTHROID) 88 MCG tablet Take 1 tablet (88 mcg total) by mouth daily before breakfast. 01/25/14   Verlee Monte, MD  Multiple Vitamins-Minerals (MULTI-VITAMIN GUMMIES PO) Take by mouth daily.    Historical Provider, MD   BP 190/97 mmHg  Pulse 82  Temp(Src) 98.3 F (36.8 C) (Oral)  Resp 18  Ht 5\' 3"  (1.6 m)  Wt 151 lb (68.493 kg)  BMI 26.76 kg/m2  SpO2 100%  LMP 07/18/2014 Physical Exam   Constitutional: She is oriented to person, place, and time. She appears well-developed and well-nourished. No distress.  HENT:  Head: Normocephalic and atraumatic.  Right Ear: Hearing normal.  Left Ear: Hearing normal.  Nose: Nose normal.  Mouth/Throat: Oropharynx is clear and moist and mucous membranes are normal.  Eyes: Conjunctivae and EOM are normal. Pupils are equal, round, and reactive to light.  Neck: Normal range of motion. Neck supple.  Cardiovascular: Regular rhythm, S1 normal and S2 normal.  Exam reveals no gallop and no friction rub.   No murmur heard. Pulses:      Radial pulses are 2+ on the right side.  Pulmonary/Chest: Effort normal and breath sounds normal. No respiratory distress. She exhibits no tenderness.  Abdominal: Soft. Normal appearance and bowel sounds are normal. There is no hepatosplenomegaly. There is no tenderness. There is no rebound, no guarding, no tenderness at McBurney's point and negative Murphy's sign. No hernia.  Musculoskeletal: Normal range of motion.       Right elbow: Tenderness found. No radial head, no medial epicondyle and no lateral epicondyle tenderness noted.  Patient has tenderness over distal tricep tendon without defect noted. Pain with flexion and extension of elbow. Position of most comfort is full extension of elbow.    Neurological: She is alert and oriented to person, place, and time. She has normal strength. No cranial nerve deficit or sensory deficit. Coordination normal. GCS eye subscore is 4. GCS verbal subscore is 5. GCS motor subscore is 6.  Skin: Skin is warm, dry and intact. No rash noted. No cyanosis.  Psychiatric: She has a normal mood and affect. Her speech is normal and behavior is normal. Thought content normal.  Nursing note and vitals reviewed.   ED Course  Procedures (including critical care time) Labs Review Labs Reviewed  CBG MONITORING, ED - Abnormal; Notable for the following:    Glucose-Capillary 195 (*)     All other components within normal limits    Imaging Review Dg Elbow Complete Right  08/05/2014   CLINICAL DATA:  Right elbow pain that radiates to the shoulder. Pain started after picking up horse feed.  EXAM: RIGHT ELBOW - COMPLETE 3+ VIEW  COMPARISON:  None.  FINDINGS: There is no evidence of fracture, dislocation, or joint effusion. There is no evidence of arthropathy or other focal bone abnormality. Soft tissues are unremarkable.  IMPRESSION: Negative.   Electronically Signed   By: Markus Daft M.D.   On: 08/05/2014 08:05     EKG Interpretation None      MDM   Final diagnoses:  None   tendinitis  Presents to the emergency department for evaluation of pain  in the right arm, specifically around the elbow. Patient complaining of pain posterior to the elbow, and the olecranon region, but olecranon process is not tender. There is no bursitis. No overlying skin changes or warmth to suggest septic joint. She has pain with flexion and extension, examination consistent with tendinitis of the distal triceps tendon. Treat with rest, analgesia, follow up with orthopedics.  Blood pressure elevated here in the ER. It has improved without intervention. Suspect combination of stress and pain. Patient counseled that she needs to follow-up with primary doctor for repeat blood pressure check. Does not require further intervention for blood pressure at this time.  Orpah Greek, MD 08/05/14 [place], MD 08/05/14 [phone]

## 2014-08-05 NOTE — Discharge Instructions (Signed)

## 2014-08-08 ENCOUNTER — Ambulatory Visit (HOSPITAL_COMMUNITY)
Admission: RE | Admit: 2014-08-08 | Discharge: 2014-08-08 | Disposition: A | Discharge status: Home / Self Care | Payer: BLUE CROSS/BLUE SHIELD | Source: Ambulatory Visit | Attending: Orthopedic Surgery | Admitting: Orthopedic Surgery

## 2014-08-08 ENCOUNTER — Encounter: Payer: Self-pay | Admitting: Orthopedic Surgery

## 2014-08-08 ENCOUNTER — Ambulatory Visit (INDEPENDENT_AMBULATORY_CARE_PROVIDER_SITE_OTHER): Payer: BLUE CROSS/BLUE SHIELD | Admitting: Orthopedic Surgery

## 2014-08-08 VITALS — BP 179/113 | Ht 63.0 in | Wt 151.0 lb

## 2014-08-08 DIAGNOSIS — M25521 Pain in right elbow: Secondary | ICD-10-CM | POA: Insufficient documentation

## 2014-08-08 DIAGNOSIS — M609 Myositis, unspecified: Secondary | ICD-10-CM | POA: Diagnosis not present

## 2014-08-08 DIAGNOSIS — R937 Abnormal findings on diagnostic imaging of other parts of musculoskeletal system: Secondary | ICD-10-CM | POA: Diagnosis not present

## 2014-08-08 MED ORDER — HYDROCODONE-ACETAMINOPHEN 5-325 MG PO TABS: 1.0000 | ORAL_TABLET | ORAL | Status: DC | PRN

## 2014-08-08 MED ORDER — PROMETHAZINE HCL 12.5 MG PO TABS: 12.5000 mg | ORAL_TABLET | ORAL | Status: DC | PRN

## 2014-08-08 NOTE — Patient Instructions (Signed)
We will schedule MRI for you and call you with appt or results  Work note x 2 weeks

## 2014-08-08 NOTE — Progress Notes (Signed)
Patient ID: Tracy [name], female   DOB: 10-23-1985, 29 y.o.   MRN: [id number] New    Chief Complaint  Patient presents with  . Elbow Pain    er follow up [place], DOI 08/05/14     Tracy [name] is a 29 y.o. female.   HPI 29 year old female presents with a two-week history of the following: She picked up a heavy bag of horse feed didn't feel any pain in her arm or elbow but then 3-4 days later when on vacation started having acute pain in the medial side of the right elbow and right arm with pain radiating proximally and distally including into the hand. When she got back from the vacation which was not especially strenuous she went to the ER for increasing pain swelling and inability to move her right elbow. She complains of pain on the medial side of the upper arm in the posterior triceps muscle with swelling of tenseness of the compartment but decreased range of motion at the elbow and severe pain unrelieved by 10 mg of hydrocodone and anti-inflammatory medication  She is diabetic. She says she can eat. Her sugars have been running normal for her 150. She says she's been feeling feverish.  Other review of systems is negative Review of Systems See hpi  Past Medical History  Diagnosis Date  . Thyroid enlargement     "not on medication at this time"  . Urinary tract infection     hx of  . Anemia     presently on iron supplement  . Diabetes mellitus     insulin pump   . Hypothyroidism   . Anxiety   . HSV-2 (herpes simplex virus 2) infection   . HSV-1 (herpes simplex virus 1) infection   . Detached retina   . Yeast infection     took diflucan saturday   . Hypertension   . Irregular periods 07/05/2014  . Vaginal discharge 07/05/2014    Past Surgical History  Procedure Laterality Date  . Cholecystectomy    . Pilonidal cyst excision    . Wisdom tooth extraction    . Pars plana vitrectomy  10/01/2011    Procedure: PARS PLANA VITRECTOMY WITH 25 GAUGE;  Surgeon:  Hayden Pedro, MD;  Location: [facility];  Service: Ophthalmology;  Laterality: Right;  Repair Complex Traction Retinal Detachment  . Trigger finger release Right 09/21/13    Family History  Problem Relation Age of Onset  . Cancer Paternal Grandfather     prostate  . Hyperlipidemia Paternal Grandfather   . Stroke Paternal Grandfather     Social History History  Substance Use Topics  . Smoking status: Never Smoker   . Smokeless tobacco: Never Used  . Alcohol Use: No    Allergies  Allergen Reactions  . Penicillins Hives and Swelling  . Sulfa Antibiotics Hives    Current Outpatient Prescriptions  Medication Sig Dispense Refill  . acyclovir (ZOVIRAX) 400 MG tablet Take 400 mg by mouth 2 (two) times daily as needed.     Marland Kitchen amLODipine (NORVASC) 10 MG tablet Take 10 mg by mouth daily.    . furosemide (LASIX) 20 MG tablet Take 20 mg by mouth 2 (two) times daily as needed.     . insulin aspart (NOVOLOG FLEXPEN) 100 UNIT/ML FlexPen Inject 5 Units into the skin 3 (three) times daily with meals.     . Insulin Glargine (LANTUS SOLOSTAR) 100 UNIT/ML SOPN Inject 28 Units into the skin at bedtime. (  Patient taking differently: Inject 15 Units into the skin every morning. )    . levothyroxine (SYNTHROID, LEVOTHROID) 88 MCG tablet Take 1 tablet (88 mcg total) by mouth daily before breakfast. 30 tablet 0  . fluconazole (DIFLUCAN) 150 MG tablet Take 1 now and 1 in 3 days 2 tablet 1  . HYDROcodone-acetaminophen (NORCO/VICODIN) 5-325 MG per tablet Take 1 tablet by mouth every 4 (four) hours as needed for moderate pain. 60 tablet 0  . ibuprofen (ADVIL,MOTRIN) 800 MG tablet Take 1 tablet (800 mg total) by mouth 3 (three) times daily. 21 tablet 0  . Multiple Vitamins-Minerals (MULTI-VITAMIN GUMMIES PO) Take by mouth daily.    . promethazine (PHENERGAN) 12.5 MG tablet Take 1 tablet (12.5 mg total) by mouth every 4 (four) hours as needed for nausea or vomiting. 60 tablet 0   No current facility-administered  medications for this visit.       Physical Exam Blood pressure 179/113, height 5\' 3"  (1.6 m), weight 151 lb (68.493 kg), last menstrual period 07/18/2014. Physical Exam The patient is well developed well nourished and well groomed. Orientation to person place and time is normal  Mood is pleasant. Ambulatory status not affected by this particular problem and gait pattern is normal  Inspection of the right arm shows a swollen tender medial triceps muscle belly from the elbow to the axilla with pain to palpation in that area. Elbow flexion is only about 40 and painful. Elbow stability is normal she is tender medially not laterally and not in the forearm. Biceps is nontender. Motor exam shows weakness of flexion not of extension skin is intact without any redness although in the hand we see several lesions 1 she says is from insect bites she's been to the dermatologist according to her. She has no sensory deficit pulse looks good capillary refill looks good compartments are soft lymph nodes are negative   Data Reviewed Plain films are normal I do see some soft tissue swelling in the area of question  Assessment I think she has some type of myositis which is common in diabetics even if there is minor trauma  Recommend MRI. Plan Mri Meds ordered this encounter  Medications  . HYDROcodone-acetaminophen (NORCO/VICODIN) 5-325 MG per tablet    Sig: Take 1 tablet by mouth every 4 (four) hours as needed for moderate pain.    Dispense:  60 tablet    Refill:  0  . promethazine (PHENERGAN) 12.5 MG tablet    Sig: Take 1 tablet (12.5 mg total) by mouth every 4 (four) hours as needed for nausea or vomiting.    Dispense:  60 tablet    Refill:  0

## 2014-08-09 ENCOUNTER — Encounter (HOSPITAL_COMMUNITY): Payer: Self-pay | Admitting: Nurse Practitioner

## 2014-08-09 ENCOUNTER — Telehealth: Payer: Self-pay | Admitting: Orthopedic Surgery

## 2014-08-09 ENCOUNTER — Inpatient Hospital Stay (HOSPITAL_COMMUNITY)
Admission: EM | Admit: 2014-08-09 | Discharge: 2014-09-02 | DRG: 853 | Disposition: A | Discharge status: Home Health | Payer: BLUE CROSS/BLUE SHIELD | Attending: Internal Medicine | Admitting: Internal Medicine

## 2014-08-09 DIAGNOSIS — I509 Heart failure, unspecified: Secondary | ICD-10-CM | POA: Diagnosis not present

## 2014-08-09 DIAGNOSIS — M60000 Infective myositis, unspecified right arm: Secondary | ICD-10-CM | POA: Diagnosis not present

## 2014-08-09 DIAGNOSIS — Z79891 Long term (current) use of opiate analgesic: Secondary | ICD-10-CM

## 2014-08-09 DIAGNOSIS — B9561 Methicillin susceptible Staphylococcus aureus infection as the cause of diseases classified elsewhere: Secondary | ICD-10-CM | POA: Diagnosis present

## 2014-08-09 DIAGNOSIS — E669 Obesity, unspecified: Secondary | ICD-10-CM | POA: Diagnosis present

## 2014-08-09 DIAGNOSIS — Z6832 Body mass index (BMI) 32.0-32.9, adult: Secondary | ICD-10-CM

## 2014-08-09 DIAGNOSIS — M609 Myositis, unspecified: Secondary | ICD-10-CM | POA: Diagnosis present

## 2014-08-09 DIAGNOSIS — N186 End stage renal disease: Secondary | ICD-10-CM | POA: Diagnosis present

## 2014-08-09 DIAGNOSIS — F419 Anxiety disorder, unspecified: Secondary | ICD-10-CM | POA: Diagnosis present

## 2014-08-09 DIAGNOSIS — E871 Hypo-osmolality and hyponatremia: Secondary | ICD-10-CM | POA: Diagnosis present

## 2014-08-09 DIAGNOSIS — M60021 Infective myositis, right upper arm: Secondary | ICD-10-CM | POA: Diagnosis present

## 2014-08-09 DIAGNOSIS — I10 Essential (primary) hypertension: Secondary | ICD-10-CM | POA: Diagnosis present

## 2014-08-09 DIAGNOSIS — A419 Sepsis, unspecified organism: Principal | ICD-10-CM

## 2014-08-09 DIAGNOSIS — Z9119 Patient's noncompliance with other medical treatment and regimen: Secondary | ICD-10-CM | POA: Diagnosis present

## 2014-08-09 DIAGNOSIS — Z9641 Presence of insulin pump (external) (internal): Secondary | ICD-10-CM | POA: Diagnosis present

## 2014-08-09 DIAGNOSIS — D509 Iron deficiency anemia, unspecified: Secondary | ICD-10-CM | POA: Diagnosis present

## 2014-08-09 DIAGNOSIS — E1065 Type 1 diabetes mellitus with hyperglycemia: Secondary | ICD-10-CM | POA: Diagnosis present

## 2014-08-09 DIAGNOSIS — Z794 Long term (current) use of insulin: Secondary | ICD-10-CM

## 2014-08-09 DIAGNOSIS — B379 Candidiasis, unspecified: Secondary | ICD-10-CM | POA: Diagnosis present

## 2014-08-09 DIAGNOSIS — E10359 Type 1 diabetes mellitus with proliferative diabetic retinopathy without macular edema: Secondary | ICD-10-CM | POA: Diagnosis present

## 2014-08-09 DIAGNOSIS — Z881 Allergy status to other antibiotic agents status: Secondary | ICD-10-CM | POA: Diagnosis not present

## 2014-08-09 DIAGNOSIS — K121 Other forms of stomatitis: Secondary | ICD-10-CM | POA: Diagnosis present

## 2014-08-09 DIAGNOSIS — Z880 Allergy status to penicillin: Secondary | ICD-10-CM

## 2014-08-09 DIAGNOSIS — J189 Pneumonia, unspecified organism: Secondary | ICD-10-CM

## 2014-08-09 DIAGNOSIS — A4901 Methicillin susceptible Staphylococcus aureus infection, unspecified site: Secondary | ICD-10-CM | POA: Diagnosis not present

## 2014-08-09 DIAGNOSIS — E039 Hypothyroidism, unspecified: Secondary | ICD-10-CM | POA: Diagnosis present

## 2014-08-09 DIAGNOSIS — IMO0002 Reserved for concepts with insufficient information to code with codable children: Secondary | ICD-10-CM | POA: Diagnosis present

## 2014-08-09 DIAGNOSIS — Z992 Dependence on renal dialysis: Secondary | ICD-10-CM | POA: Diagnosis present

## 2014-08-09 DIAGNOSIS — N179 Acute kidney failure, unspecified: Secondary | ICD-10-CM | POA: Diagnosis not present

## 2014-08-09 DIAGNOSIS — B009 Herpesviral infection, unspecified: Secondary | ICD-10-CM | POA: Diagnosis present

## 2014-08-09 DIAGNOSIS — D649 Anemia, unspecified: Secondary | ICD-10-CM | POA: Diagnosis present

## 2014-08-09 DIAGNOSIS — L02415 Cutaneous abscess of right lower limb: Secondary | ICD-10-CM | POA: Diagnosis present

## 2014-08-09 DIAGNOSIS — M79606 Pain in leg, unspecified: Secondary | ICD-10-CM

## 2014-08-09 DIAGNOSIS — N2581 Secondary hyperparathyroidism of renal origin: Secondary | ICD-10-CM | POA: Diagnosis present

## 2014-08-09 DIAGNOSIS — J9601 Acute respiratory failure with hypoxia: Secondary | ICD-10-CM | POA: Diagnosis present

## 2014-08-09 DIAGNOSIS — Z79899 Other long term (current) drug therapy: Secondary | ICD-10-CM | POA: Diagnosis not present

## 2014-08-09 DIAGNOSIS — D631 Anemia in chronic kidney disease: Secondary | ICD-10-CM | POA: Diagnosis present

## 2014-08-09 DIAGNOSIS — M60051 Infective myositis, right thigh: Secondary | ICD-10-CM | POA: Diagnosis not present

## 2014-08-09 DIAGNOSIS — E113599 Type 2 diabetes mellitus with proliferative diabetic retinopathy without macular edema, unspecified eye: Secondary | ICD-10-CM | POA: Diagnosis present

## 2014-08-09 DIAGNOSIS — R0902 Hypoxemia: Secondary | ICD-10-CM

## 2014-08-09 DIAGNOSIS — N184 Chronic kidney disease, stage 4 (severe): Secondary | ICD-10-CM

## 2014-08-09 DIAGNOSIS — M7989 Other specified soft tissue disorders: Secondary | ICD-10-CM | POA: Diagnosis not present

## 2014-08-09 DIAGNOSIS — I120 Hypertensive chronic kidney disease with stage 5 chronic kidney disease or end stage renal disease: Secondary | ICD-10-CM | POA: Diagnosis present

## 2014-08-09 DIAGNOSIS — E1022 Type 1 diabetes mellitus with diabetic chronic kidney disease: Secondary | ICD-10-CM | POA: Diagnosis present

## 2014-08-09 DIAGNOSIS — D72829 Elevated white blood cell count, unspecified: Secondary | ICD-10-CM

## 2014-08-09 DIAGNOSIS — N170 Acute kidney failure with tubular necrosis: Secondary | ICD-10-CM | POA: Diagnosis present

## 2014-08-09 DIAGNOSIS — R569 Unspecified convulsions: Secondary | ICD-10-CM

## 2014-08-09 DIAGNOSIS — Z01810 Encounter for preprocedural cardiovascular examination: Secondary | ICD-10-CM | POA: Diagnosis not present

## 2014-08-09 DIAGNOSIS — Z95828 Presence of other vascular implants and grafts: Secondary | ICD-10-CM

## 2014-08-09 DIAGNOSIS — E1069 Type 1 diabetes mellitus with other specified complication: Secondary | ICD-10-CM | POA: Diagnosis not present

## 2014-08-09 DIAGNOSIS — N289 Disorder of kidney and ureter, unspecified: Secondary | ICD-10-CM

## 2014-08-09 DIAGNOSIS — M60009 Infective myositis, unspecified site: Secondary | ICD-10-CM | POA: Diagnosis not present

## 2014-08-09 LAB — GLUCOSE, CAPILLARY: GLUCOSE-CAPILLARY: 93 mg/dL (ref 65–99)

## 2014-08-09 LAB — CBC WITH DIFFERENTIAL/PLATELET
BASOS ABS: 0 10*3/uL (ref 0.0–0.1)
Basophils Relative: 0 % (ref 0–1)
EOS ABS: 0 10*3/uL (ref 0.0–0.7)
EOS PCT: 0 % (ref 0–5)
HCT: 28.5 % — ABNORMAL LOW (ref 36.0–46.0)
Hemoglobin: 9.5 g/dL — ABNORMAL LOW (ref 12.0–15.0)
Lymphocytes Relative: 7 % — ABNORMAL LOW (ref 12–46)
Lymphs Abs: 1.5 10*3/uL (ref 0.7–4.0)
MCH: 27.5 pg (ref 26.0–34.0)
MCHC: 33.3 g/dL (ref 30.0–36.0)
MCV: 82.6 fL (ref 78.0–100.0)
Monocytes Absolute: 1.5 10*3/uL — ABNORMAL HIGH (ref 0.1–1.0)
Monocytes Relative: 7 % (ref 3–12)
Neutro Abs: 19.5 10*3/uL — ABNORMAL HIGH (ref 1.7–7.7)
Neutrophils Relative %: 86 % — ABNORMAL HIGH (ref 43–77)
Platelets: 308 10*3/uL (ref 150–400)
RBC: 3.45 MIL/uL — AB (ref 3.87–5.11)
RDW: 13.6 % (ref 11.5–15.5)
WBC: 22.6 10*3/uL — AB (ref 4.0–10.5)

## 2014-08-09 LAB — URINE MICROSCOPIC-ADD ON

## 2014-08-09 LAB — COMPREHENSIVE METABOLIC PANEL
ALK PHOS: 127 U/L — AB (ref 38–126)
ALT: 15 U/L (ref 14–54)
AST: 28 U/L (ref 15–41)
Albumin: 2.9 g/dL — ABNORMAL LOW (ref 3.5–5.0)
Anion gap: 9 (ref 5–15)
BILIRUBIN TOTAL: 0.5 mg/dL (ref 0.3–1.2)
BUN: 41 mg/dL — ABNORMAL HIGH (ref 6–20)
CO2: 22 mmol/L (ref 22–32)
Calcium: 9 mg/dL (ref 8.9–10.3)
Chloride: 105 mmol/L (ref 101–111)
Creatinine, Ser: 5.46 mg/dL — ABNORMAL HIGH (ref 0.44–1.00)
GFR calc Af Amer: 11 mL/min — ABNORMAL LOW (ref >60)
GFR, EST NON AFRICAN AMERICAN: 10 mL/min — AB (ref >60)
Glucose, Bld: 68 mg/dL (ref 65–99)
POTASSIUM: 4.5 mmol/L (ref 3.5–5.1)
SODIUM: 136 mmol/L (ref 135–145)
Total Protein: 7.6 g/dL (ref 6.5–8.1)

## 2014-08-09 LAB — URINALYSIS, ROUTINE W REFLEX MICROSCOPIC
Bilirubin Urine: NEGATIVE
GLUCOSE, UA: 100 mg/dL — AB
Ketones, ur: NEGATIVE mg/dL
Leukocytes, UA: NEGATIVE
NITRITE: NEGATIVE
Protein, ur: >300 mg/dL — AB
SPECIFIC GRAVITY, URINE: 1.018 (ref 1.005–1.030)
UROBILINOGEN UA: 0.2 mg/dL (ref 0.0–1.0)
pH: 5 (ref 5.0–8.0)

## 2014-08-09 LAB — I-STAT CG4 LACTIC ACID, ED
Lactic Acid, Venous: 1.35 mmol/L (ref 0.5–2.0)
Lactic Acid, Venous: 0.59 mmol/L (ref 0.5–2.0)

## 2014-08-09 LAB — CK: Total CK: 257 U/L — ABNORMAL HIGH (ref 38–234)

## 2014-08-09 MED ORDER — SODIUM CHLORIDE 0.9 % IJ SOLN
3.0000 mL | Freq: Two times a day (BID) | INTRAMUSCULAR | Status: DC
  Administered 2014-08-09 – 2014-09-01 (×17): 3 mL via INTRAVENOUS

## 2014-08-09 MED ORDER — MULTI-VITAMIN GUMMIES PO CHEW: 1.0000 | CHEWABLE_TABLET | Freq: Every day | ORAL | Status: DC

## 2014-08-09 MED ORDER — HYDROMORPHONE HCL 1 MG/ML IJ SOLN
1.0000 mg | INTRAMUSCULAR | Status: DC | PRN
  Administered 2014-08-09 – 2014-08-21 (×64): 1 mg via INTRAVENOUS
  Filled 2014-08-09 (×66): qty 1

## 2014-08-09 MED ORDER — HEPARIN SODIUM (PORCINE) 5000 UNIT/ML IJ SOLN
5000.0000 [IU] | Freq: Three times a day (TID) | INTRAMUSCULAR | Status: DC
  Administered 2014-08-09 – 2014-08-16 (×16): 5000 [IU] via SUBCUTANEOUS
  Filled 2014-08-09 (×25): qty 1

## 2014-08-09 MED ORDER — LEVOTHYROXINE SODIUM 88 MCG PO TABS
88.0000 ug | ORAL_TABLET | Freq: Every day | ORAL | Status: DC
  Administered 2014-08-10: 88 ug via ORAL
  Filled 2014-08-09 (×2): qty 1

## 2014-08-09 MED ORDER — HYDRALAZINE HCL 20 MG/ML IJ SOLN: 5.0000 mg | INTRAMUSCULAR | Status: DC | PRN

## 2014-08-09 MED ORDER — AZTREONAM 1 G IJ SOLR
1.0000 g | Freq: Three times a day (TID) | INTRAMUSCULAR | Status: DC
  Administered 2014-08-09 – 2014-08-15 (×16): 1 g via INTRAVENOUS
  Filled 2014-08-09 (×23): qty 1

## 2014-08-09 MED ORDER — HYDROMORPHONE HCL 1 MG/ML IJ SOLN
1.0000 mg | Freq: Once | INTRAMUSCULAR | Status: AC
  Administered 2014-08-09: 1 mg via INTRAVENOUS
  Filled 2014-08-09: qty 1

## 2014-08-09 MED ORDER — INSULIN GLARGINE 100 UNIT/ML ~~LOC~~ SOLN
12.0000 [IU] | Freq: Every day | SUBCUTANEOUS | Status: DC
  Filled 2014-08-09: qty 0.12

## 2014-08-09 MED ORDER — VANCOMYCIN HCL IN DEXTROSE 750-5 MG/150ML-% IV SOLN: 750.0000 mg | Freq: Two times a day (BID) | INTRAVENOUS | Status: DC

## 2014-08-09 MED ORDER — ACETAMINOPHEN 325 MG PO TABS: 650.0000 mg | ORAL_TABLET | Freq: Four times a day (QID) | ORAL | Status: DC | PRN

## 2014-08-09 MED ORDER — VANCOMYCIN HCL IN DEXTROSE 1-5 GM/200ML-% IV SOLN
1000.0000 mg | INTRAVENOUS | Status: DC
  Administered 2014-08-11 – 2014-08-13 (×2): 1000 mg via INTRAVENOUS
  Filled 2014-08-09 (×3): qty 200

## 2014-08-09 MED ORDER — INSULIN GLARGINE 100 UNIT/ML SOLOSTAR PEN: 12.0000 [IU] | PEN_INJECTOR | Freq: Every day | SUBCUTANEOUS | Status: DC

## 2014-08-09 MED ORDER — INSULIN ASPART 100 UNIT/ML ~~LOC~~ SOLN
0.0000 [IU] | Freq: Three times a day (TID) | SUBCUTANEOUS | Status: DC
  Administered 2014-08-10: 2 [IU] via SUBCUTANEOUS
  Administered 2014-08-10: 1 [IU] via SUBCUTANEOUS
  Administered 2014-08-11 (×3): 2 [IU] via SUBCUTANEOUS
  Administered 2014-08-12: 3 [IU] via SUBCUTANEOUS
  Administered 2014-08-12 (×2): 2 [IU] via SUBCUTANEOUS
  Administered 2014-08-13: 3 [IU] via SUBCUTANEOUS

## 2014-08-09 MED ORDER — HYDROMORPHONE HCL 1 MG/ML IJ SOLN
1.0000 mg | Freq: Once | INTRAMUSCULAR | Status: AC
  Filled 2014-08-09: qty 1

## 2014-08-09 MED ORDER — SODIUM CHLORIDE 0.9 % IV SOLN
INTRAVENOUS | Status: DC
  Administered 2014-08-10: 16:00:00 via INTRAVENOUS
  Administered 2014-08-10: 125 mL/h via INTRAVENOUS
  Administered 2014-08-11: 03:00:00 via INTRAVENOUS

## 2014-08-09 MED ORDER — VANCOMYCIN HCL IN DEXTROSE 1-5 GM/200ML-% IV SOLN: 1000.0000 mg | Freq: Once | INTRAVENOUS | Status: DC

## 2014-08-09 MED ORDER — SODIUM CHLORIDE 0.9 % IV BOLUS (SEPSIS)
1000.0000 mL | Freq: Once | INTRAVENOUS | Status: AC
Start: 2014-08-09 — End: 2014-08-10
  Administered 2014-08-09: 1000 mL via INTRAVENOUS

## 2014-08-09 MED ORDER — HYDROCODONE-ACETAMINOPHEN 5-325 MG PO TABS
2.0000 | ORAL_TABLET | ORAL | Status: DC | PRN
  Administered 2014-08-10 – 2014-08-23 (×18): 2 via ORAL
  Filled 2014-08-09 (×20): qty 2

## 2014-08-09 MED ORDER — SODIUM CHLORIDE 0.9 % IV BOLUS (SEPSIS): 1000.0000 mL | Freq: Once | INTRAVENOUS | Status: DC

## 2014-08-09 MED ORDER — ADULT MULTIVITAMIN W/MINERALS CH
1.0000 | ORAL_TABLET | Freq: Every day | ORAL | Status: DC
  Administered 2014-08-17: 1 via ORAL
  Filled 2014-08-09 (×21): qty 1

## 2014-08-09 MED ORDER — ONDANSETRON HCL 4 MG/2ML IJ SOLN
4.0000 mg | Freq: Three times a day (TID) | INTRAMUSCULAR | Status: DC | PRN
  Administered 2014-08-10 – 2014-09-01 (×11): 4 mg via INTRAVENOUS
  Filled 2014-08-09 (×10): qty 2

## 2014-08-09 MED ORDER — AMLODIPINE BESYLATE 10 MG PO TABS
10.0000 mg | ORAL_TABLET | Freq: Every day | ORAL | Status: DC
  Administered 2014-08-09 – 2014-08-28 (×19): 10 mg via ORAL
  Filled 2014-08-09 (×23): qty 1

## 2014-08-09 MED ORDER — MORPHINE SULFATE 4 MG/ML IJ SOLN
4.0000 mg | Freq: Once | INTRAMUSCULAR | Status: AC
  Administered 2014-08-09: 4 mg via INTRAVENOUS
  Filled 2014-08-09: qty 1

## 2014-08-09 MED ORDER — ACETAMINOPHEN 650 MG RE SUPP
650.0000 mg | Freq: Four times a day (QID) | RECTAL | Status: DC | PRN
  Filled 2014-08-09: qty 1

## 2014-08-09 MED ORDER — VANCOMYCIN HCL IN DEXTROSE 1-5 GM/200ML-% IV SOLN
1000.0000 mg | INTRAVENOUS | Status: DC
  Administered 2014-08-09: 1000 mg via INTRAVENOUS
  Filled 2014-08-09: qty 200

## 2014-08-09 MED ORDER — HYDROMORPHONE HCL 1 MG/ML IJ SOLN
1.0000 mg | Freq: Once | INTRAMUSCULAR | Status: AC
  Administered 2014-08-09: 1 mg via INTRAVENOUS

## 2014-08-09 MED ORDER — ALUM & MAG HYDROXIDE-SIMETH 200-200-20 MG/5ML PO SUSP: 30.0000 mL | Freq: Four times a day (QID) | ORAL | Status: DC | PRN

## 2014-08-09 NOTE — Progress Notes (Signed)
Received report from ED. Awaiting patient's arrival.

## 2014-08-09 NOTE — ED Notes (Signed)
Sent from ortho MD for admission due to myonecrosis of R arm found on MRI today. She c/o severe R arm pain for days and today her R leg began to feel the same way her arm does. She is A&Ox4, resp e/u

## 2014-08-09 NOTE — Progress Notes (Signed)
New Admission Note:   Arrival Method: per stretcher from ED with NT Markus Daft and mother Mental Orientation: alert, oriented X4, Telemetry: placed on telebox 16 after CCMD notified Assessment: Completed Skin: warm, dry, intact, no wounds noted. Swelling on the right arm noted, warm to touch. IV: G22 on the left AC with transparent dressing, clean, dry and intact Pain: 6/10 scale on the right arm, radiating on the right hand, right groin and thigh. Pain medicine given at ED before transport. Tubes: IV line with fluids infusing Safety Measures: Safety Fall Prevention Plan has been given, discussed and signed Admission: Completed [address] [place] Orientation: Patient has been orientated to the room, unit and staff.  Family: mother at bedside  Orders have been reviewed and implemented. Will continue to monitor the patient. Call light has been placed within reach and bed alarm has been activated.   Georgeanna Harrison BSN, RN [place] [address] [place]

## 2014-08-09 NOTE — Telephone Encounter (Signed)
Patient called to request MRI results; said "sounded like Dr Aline Brochure wanted to relay as soon as possible" - her ph# is [phone]

## 2014-08-09 NOTE — Telephone Encounter (Signed)
The results were that the muscle of her arm indeed has myositis and she should call her primary care physician and ask him if he will admit her to [facility] for treatment. He can call me for details of her problem

## 2014-08-09 NOTE — Telephone Encounter (Signed)
Patient aware.

## 2014-08-09 NOTE — ED Notes (Signed)
I gave the patient a pair of tan large socks.

## 2014-08-09 NOTE — Progress Notes (Addendum)
ANTIBIOTIC CONSULT NOTE - INITIAL  Pharmacy Consult for Vancomycin and Aztreonam Indication: myonecrosis  Allergies  Allergen Reactions  . Penicillins Hives and Swelling  . Sulfa Antibiotics Hives    Patient Measurements:   Adjusted Body Weight:   Vital Signs: Temp: 99.1 F (37.3 C) (06/28 1811) Temp Source: Oral (06/28 1811) BP: 156/78 mmHg (06/28 2145) Pulse Rate: 96 (06/28 2145) Intake/Output from previous day:   Intake/Output from this shift:    Labs:  Recent Labs  08/09/14 1819  WBC 22.6*  HGB 9.5*  PLT 308  CREATININE 5.46*   Estimated Creatinine Clearance: 14 mL/min (by C-G formula based on Cr of 5.46). No results for input(s): VANCOTROUGH, VANCOPEAK, VANCORANDOM, GENTTROUGH, GENTPEAK, GENTRANDOM, TOBRATROUGH, TOBRAPEAK, TOBRARND, AMIKACINPEAK, AMIKACINTROU, AMIKACIN in the last 72 hours.   Microbiology: No results found for this or any previous visit (from the past 720 hour(s)).  Medical History: Past Medical History  Diagnosis Date  . Thyroid enlargement     "not on medication at this time"  . Urinary tract infection     hx of  . Anemia     presently on iron supplement  . Diabetes mellitus     insulin pump   . Hypothyroidism   . Anxiety   . HSV-2 (herpes simplex virus 2) infection   . HSV-1 (herpes simplex virus 1) infection   . Detached retina   . Yeast infection     took diflucan saturday   . Hypertension   . Irregular periods 07/05/2014  . Vaginal discharge 07/05/2014    Medications:   (Not in a hospital admission) Scheduled:   Infusions:  . sodium chloride     Assessment: 29yo female with history of hypothyroidism, DM, anemia, and HTN presents from [facility] with myositis. Pharmacy is consulted to dose vancomycin and aztreonam for myonecrosis. Pt is afebrile, WBC 22.6, sCr 5.5 (up from baseline in ~2).  Goal of Therapy:  Vancomycin trough level 15-20 mcg/ml  Plan:  Vancomycin 1g IV q48h Aztreonam 1g IV q8h Measure  antibiotic drug levels at steady state Follow up culture results, renal function, and clinical course  Andrey Cota. Diona Foley, PharmD Clinical Pharmacist Pager [phone] 08/09/2014,9:54 PM

## 2014-08-09 NOTE — H&P (Signed)
[name] History and Physical  Tracy [name] [id number] DOB: November 13, 1985 DOA: 08/09/2014  Referring physician: ED physician PCP: [name]  Specialists:   Chief Complaint: Right arm and right thigh  HPI: Tracy [name] is a 29 y.o. female with PMH of hypertension, diabetes type 1, hypothyroidism, anxiety, anemia, CKD-IV, who presents with right arm and upper leg pain.  Patient reports that she started having pain in right arm and right thigh after throwing a horse feed bag 2 weeks ago. She felt a pop/strain in her right upper arm near the elbow and experienced pain in her right thigh. The patient states that she continue to perform her job activities. She states she continued to have pain and finally went to ED 5 days ago at [facility]. She had an x-rays there and was felt she had a strain in her triceps. She was referred to ortho, Dr. Aline Brochure. She states Dr. Aline Brochure performed an MRI of right arm yesterday, which showed myositis. She was advised to come to the hospital for further evaluation and treatment. Currently patient has severe pain over right upper arm and right thigh. She also had swelling in the same areas which is decreased per pt. patient had nausea, and vomited several times without blood in the vomitus. No abdominal pain or diarrhea. She does not have subjective fever or chills. No chest pain, cough, any lateral weakness.  In ED, patient was found to have WBC 22.6, temperature 99.1, tachycardia, lactate 1.35, negative urinalysis (30 Due to any squamous cell), AoCKD-IV. Patient is admitted to inpatient for further evaluation and treatment. Orthopedic surgeon and general surgeon were consulted.  Where does patient live?   At home    Can patient participate in ADLs?  Yes   Review of Systems:   General: no fevers, chills, no changes in body weight, has fatigue HEENT: no blurry vision, hearing changes or sore throat Pulm: no dyspnea, coughing,  wheezing CV: no chest pain, palpitations Abd: had nausea, vomiting, no abdominal pain, diarrhea, constipation GU: no dysuria, burning on urination, increased urinary frequency, hematuria  Ext: has right arm and thigh swelling Neuro: no unilateral weakness, numbness, or tingling, no vision change or hearing loss Skin: no rash MSK: No muscle spasm, no deformity, no limitation of range of movement in spin Heme: No easy bruising.  Travel history: No recent long distant travel.  Allergy:  Allergies  Allergen Reactions  . Penicillins Hives and Swelling  . Sulfa Antibiotics Hives    Past Medical History  Diagnosis Date  . Thyroid enlargement     "not on medication at this time"  . Urinary tract infection     hx of  . Anemia     presently on iron supplement  . Diabetes mellitus     insulin pump   . Hypothyroidism   . Anxiety   . HSV-2 (herpes simplex virus 2) infection   . HSV-1 (herpes simplex virus 1) infection   . Detached retina   . Yeast infection     took diflucan saturday   . Hypertension   . Irregular periods 07/05/2014  . Vaginal discharge 07/05/2014    Past Surgical History  Procedure Laterality Date  . Cholecystectomy    . Pilonidal cyst excision    . Wisdom tooth extraction    . Pars plana vitrectomy  10/01/2011    Procedure: PARS PLANA VITRECTOMY WITH 25 GAUGE;  Surgeon: Hayden Pedro, MD;  Location: [place];  Service: Ophthalmology;  Laterality:  Right;  Repair Complex Traction Retinal Detachment  . Trigger finger release Right 09/21/13    Social History:  reports that she has never smoked. She has never used smokeless tobacco. She reports that she does not drink alcohol or use illicit drugs.  Family History:  Family History  Problem Relation Age of Onset  . Cancer Paternal Grandfather     prostate  . Hyperlipidemia Paternal Grandfather   . Stroke Paternal Grandfather      Prior to Admission medications   Medication Sig Start Date End Date Taking?  Authorizing Provider  acyclovir (ZOVIRAX) 400 MG tablet Take 400 mg by mouth 2 (two) times daily as needed. Fever blisters   Yes Historical Provider, MD  amLODipine (NORVASC) 10 MG tablet Take 10 mg by mouth daily.   Yes Historical Provider, MD  furosemide (LASIX) 20 MG tablet Take 20 mg by mouth 2 (two) times daily as needed for fluid.    Yes Historical Provider, MD  HYDROcodone-acetaminophen (NORCO/VICODIN) 5-325 MG per tablet Take 1 tablet by mouth every 4 (four) hours as needed for moderate pain. 08/08/14  Yes Carole Civil, MD  ibuprofen (ADVIL,MOTRIN) 800 MG tablet Take 1 tablet (800 mg total) by mouth 3 (three) times daily. 08/05/14  Yes Orpah Greek, MD  insulin aspart (NOVOLOG FLEXPEN) 100 UNIT/ML FlexPen Inject 5 Units into the skin 3 (three) times daily with meals.    Yes Historical Provider, MD  Insulin Glargine (LANTUS SOLOSTAR) 100 UNIT/ML SOPN Inject 28 Units into the skin at bedtime. Patient taking differently: Inject 15 Units into the skin every morning.  01/22/13  Yes Samuella Cota, MD  levothyroxine (SYNTHROID, LEVOTHROID) 88 MCG tablet Take 1 tablet (88 mcg total) by mouth daily before breakfast. 01/25/14  Yes Verlee Monte, MD  Multiple Vitamins-Minerals (MULTI-VITAMIN GUMMIES PO) Take by mouth daily.   Yes Historical Provider, MD  promethazine (PHENERGAN) 12.5 MG tablet Take 1 tablet (12.5 mg total) by mouth every 4 (four) hours as needed for nausea or vomiting. 08/08/14  Yes Carole Civil, MD  fluconazole (DIFLUCAN) 150 MG tablet Take 1 now and 1 in 3 days Patient not taking: Reported on 08/09/2014 07/05/14   Estill Dooms, NP    Physical Exam: Filed Vitals:   08/09/14 2115 08/09/14 2130 08/09/14 2145 08/09/14 2301  BP: 160/84 162/80 156/78 173/82  Pulse: 97 97 96 94  Temp:    98.7 F (37.1 C)  TempSrc:    Oral  Resp: 14 10 12 16   Weight:    68.04 kg (150 lb)  SpO2: 100% 100% 100% 98%   General: Not in acute distress HEENT:       Eyes: PERRL,  EOMI, no scleral icterus.       ENT: No discharge from the ears and nose, no pharynx injection, no tonsillar enlargement.        Neck: No JVD, no bruit, no mass felt. Heme: No neck lymph node enlargement. Cardiac: S1/S2, RRR, No murmurs, No gallops or rubs. Pulm: No rales, wheezing, rhonchi or rubs. Abd: Soft, nondistended, nontender, no rebound pain, no organomegaly, BS present. Ext: No pitting leg edema bilaterally. 2+DP/PT pulse bilaterally. Musculoskeletal: No joint deformities, No joint redness or warmth, no limitation of ROM in spin. There is swelling and tenderness over right upper arm and right thigh. Skin: No rashes.  Neuro: Alert, oriented X3, cranial nerves II-XII grossly intact, muscle strength 5/5 in all extremities, sensation to light touch intact.  Psych: Patient is not psychotic,  no suicidal or hemocidal ideation.  Labs on Admission:  Basic Metabolic Panel:  Recent Labs Lab 08/09/14 1819 08/09/14 2350 08/10/14 0128  NA 136  --  135  K 4.5  --  4.0  CL 105  --  103  CO2 22  --  21*  GLUCOSE 68  --  101*  BUN 41*  --  39*  CREATININE 5.46*  --  5.33*  CALCIUM 9.0  --  8.2*  MG  --  2.0  --   PHOS  --  4.8*  --    Liver Function Tests:  Recent Labs Lab 08/09/14 1819 08/10/14 0128  AST 28 38  ALT 15 18  ALKPHOS 127* 166*  BILITOT 0.5 0.6  PROT 7.6 5.7*  ALBUMIN 2.9* 2.2*   No results for input(s): LIPASE, AMYLASE in the last 168 hours. No results for input(s): AMMONIA in the last 168 hours. CBC:  Recent Labs Lab 08/09/14 1819 08/10/14 0128  WBC 22.6* 18.7*  NEUTROABS 19.5*  --   HGB 9.5* 7.8*  HCT 28.5* 23.4*  MCV 82.6 83.0  PLT 308 242   Cardiac Enzymes:  Recent Labs Lab 08/09/14 2033  CKTOTAL 257*    BNP (last 3 results) No results for input(s): BNP in the last 8760 hours.  ProBNP (last 3 results) No results for input(s): PROBNP in the last 8760 hours.  CBG:  Recent Labs Lab 08/05/14 0752 08/09/14 2320  GLUCAP 195* 93     Radiological Exams on Admission: Mr Humerus Right Wo Contrast  08/09/2014   CLINICAL DATA:  Pain throughout entire right humerus for 2 weeks.  EXAM: MRI OF THE RIGHT HUMERUS WITHOUT CONTRAST  TECHNIQUE: Multiplanar, multisequence MR imaging was performed. No intravenous contrast was administered.  COMPARISON:  None.  FINDINGS: No marrow signal abnormality. No fracture or dislocation. There is no bone destruction or periosteal reaction.  There is severe edema within the right triceps muscle with small areas of sparing proximally. At the distal musculotendinous junction of the medial head of the triceps, there is a complex 2 x 2 x 2.3 cm T2 signal abnormality with central T2 hyperintensity and peripheral intermediate signal. There is mild edema in the adjacent subcutaneous fat.  The biceps musculature is normal. There is no other fluid collection or soft tissue mass. The neurovascular bundles are grossly normal. There are multiple prominent right axillary lymph nodes.  IMPRESSION: 1. Severe edema within the right triceps muscle with small areas of sparing proximally. At the distal musculotendinous junction of the medial head of the triceps, there is a complex 2 x 2 x 2.3 cm T2 signal abnormality. The overall appearance is concerning for myositis which may be secondary to an infectious or inflammatory etiology. The 2 cm area of focal abnormal signal which may reflect a hematoma secondary to a partial tear given the location is at the musculotendinous junction of the medial head of the triceps, but given the extensiveness of the muscle edema, this may reflect a developing abscess versus myonecrosis.   Electronically Signed   By: Kathreen Devoid   On: 08/09/2014 08:31   Ct Femur Right Wo Contrast  08/10/2014   CLINICAL DATA:  Myositis. Sudden onset of pain while lifting a feed bag 2 weeks ago.  EXAM: CT OF THE LOWER RIGHT EXTREMITY WITHOUT CONTRAST  TECHNIQUE: Multidetector CT imaging of the right femur and right  tibia fibula was performed according to the standard protocol.  COMPARISON:  None.  FINDINGS: There is no  bony abnormality. There is no radiopaque foreign body. There is no soft tissue mass. There is no abscess or drainable fluid collection. There is no soft tissue gas.  There is soft tissue edema around the muscles of the right lower extremity, particularly around the adductor of the via and the quadriceps musculature. This is consistent with the clinically described suspicion of myositis. There is a small knee joint effusion.  IMPRESSION: Soft tissue edema surrounding the musculature of the right lower extremity consistent with myositis. No mass or drainable fluid collection.   Electronically Signed   By: Andreas Newport M.D.   On: 08/10/2014 03:49   Ct Tibia Fibula Right Wo Contrast  08/10/2014   CLINICAL DATA:  Myositis. Sudden onset of pain while lifting a feed bag 2 weeks ago.  EXAM: CT OF THE LOWER RIGHT EXTREMITY WITHOUT CONTRAST  TECHNIQUE: Multidetector CT imaging of the right femur and right tibia fibula was performed according to the standard protocol.  COMPARISON:  None.  FINDINGS: There is no bony abnormality. There is no radiopaque foreign body. There is no soft tissue mass. There is no abscess or drainable fluid collection. There is no soft tissue gas.  There is soft tissue edema around the muscles of the right lower extremity, particularly around the adductor of the via and the quadriceps musculature. This is consistent with the clinically described suspicion of myositis. There is a small knee joint effusion.  IMPRESSION: Soft tissue edema surrounding the musculature of the right lower extremity consistent with myositis. No mass or drainable fluid collection.   Electronically Signed   By: Andreas Newport M.D.   On: 08/10/2014 03:49   Dg Femur, Min 2 Views Right  08/10/2014   CLINICAL DATA:  Right thigh pain. Concern for myositis. Rule out soft tissue air.  EXAM: RIGHT FEMUR 2 VIEWS   COMPARISON:  None.  FINDINGS: Cortical margins of the right femur are intact. No fracture, focal osseous lesion, erosion or periosteal reaction. No soft tissue air or radiopaque foreign body. Questionable soft tissue edema proximally.  IMPRESSION: Suspect mild soft tissue edema proximally. No soft tissue air, radiopaque foreign body, or acute osseous abnormality.   Electronically Signed   By: Jeb Levering M.D.   On: 08/10/2014 01:48    EKG: Not done in ED, will get one.   Assessment/Plan Principal Problem:   Myositis Active Problems:   Proliferative diabetic retinopathy   Hypothyroidism   Normocytic anemia   Acute renal failure superimposed on stage 4 chronic kidney disease   Seizure   Sepsis   DM I (diabetes mellitus, type I), uncontrolled   Essential hypertension  Myositis in right arm and sepsis: MRI showed possible myositis. Patient is septic on admission with leukocytosis, tachycardia and mild fever. She is hemodynamically stable. Orthopedic surgery was consulted, Dr. Amedeo Plenty saw pt and recommend an ultrasound to rule out DVT and MRI in AM. He also suggested to consult to general surgeon for evaluation of right thigh pain. Since patient has poorly controlled diabetes, will need antibodies with broader coverage.  - will admit to tele bed - Appreciate Dr. Vanetta Shawl consultation, will follow up recommendations to get an ultrasound to rule out DVT and repeat MRI of right arm in AM. - start IV vancomycin and aztreonam --will get Procalcitonin and trend lactic acid levels per sepsis protocol. - IVF: 2L of NS bolus in ED, followed by 125 cc/h  - When necessary Dilaudid and Percocet for pain, Zofran for nausea. - General surgeon was  consulted for evaluation of right thigh pain, need to follow-up recommendations. - LE doppler to r/o DVT  DM-I: Last A1c 16.8 on 01/20/13, poorly controled. Patient is taking Lantus at home -will decrease Lantus dose from 18 to 12 units daily -SSI -Check  A1c  Hypothyroidism: Last TSH was 9.950 on 01/24/14 -Continue home Synthroid -Check TSH  Normocytic anemia: Anemia panel on 01/24/14 showed iron 49, ferritin 152. Hemoglobin stable. -Follow-up by CBC  AoCKD-IV: Likely due to prerenal secondary to dehydration and continuation of diuetics and NSAIDs - IVF as above - Check FeUrea - US-renal - Follow up renal function by BMP - hold lasix and ibuprofen  HTN: -Amlodipine  -hydralazine IV when necessary   DVT ppx: SQ Heparin     Code Status: Full code Family Communication:  Yes, patient's boyfriend and mother at bed side Disposition Plan: Admit to inpatient   Date of Service 08/10/2014    Ivor Costa [name] Pager [phone]  If 7PM-7AM, please contact night-coverage [email or web address] Password [facility] 08/10/2014, 5:24 AM

## 2014-08-10 ENCOUNTER — Inpatient Hospital Stay (HOSPITAL_COMMUNITY): Payer: BLUE CROSS/BLUE SHIELD

## 2014-08-10 ENCOUNTER — Other Ambulatory Visit: Payer: Self-pay

## 2014-08-10 ENCOUNTER — Ambulatory Visit (HOSPITAL_COMMUNITY): Payer: BLUE CROSS/BLUE SHIELD

## 2014-08-10 ENCOUNTER — Encounter (HOSPITAL_COMMUNITY): Payer: Self-pay | Admitting: *Deleted

## 2014-08-10 DIAGNOSIS — I10 Essential (primary) hypertension: Secondary | ICD-10-CM | POA: Diagnosis present

## 2014-08-10 DIAGNOSIS — M7989 Other specified soft tissue disorders: Secondary | ICD-10-CM

## 2014-08-10 LAB — COMPREHENSIVE METABOLIC PANEL
ALT: 18 U/L (ref 14–54)
ANION GAP: 11 (ref 5–15)
AST: 38 U/L (ref 15–41)
Albumin: 2.2 g/dL — ABNORMAL LOW (ref 3.5–5.0)
Alkaline Phosphatase: 166 U/L — ABNORMAL HIGH (ref 38–126)
BUN: 39 mg/dL — AB (ref 6–20)
CALCIUM: 8.2 mg/dL — AB (ref 8.9–10.3)
CHLORIDE: 103 mmol/L (ref 101–111)
CO2: 21 mmol/L — ABNORMAL LOW (ref 22–32)
Creatinine, Ser: 5.33 mg/dL — ABNORMAL HIGH (ref 0.44–1.00)
GFR calc Af Amer: 11 mL/min — ABNORMAL LOW (ref >60)
GFR calc non Af Amer: 10 mL/min — ABNORMAL LOW (ref >60)
GLUCOSE: 101 mg/dL — AB (ref 65–99)
Potassium: 4 mmol/L (ref 3.5–5.1)
SODIUM: 135 mmol/L (ref 135–145)
TOTAL PROTEIN: 5.7 g/dL — AB (ref 6.5–8.1)
Total Bilirubin: 0.6 mg/dL (ref 0.3–1.2)

## 2014-08-10 LAB — CBC
HEMATOCRIT: 23.4 % — AB (ref 36.0–46.0)
HEMOGLOBIN: 7.8 g/dL — AB (ref 12.0–15.0)
MCH: 27.7 pg (ref 26.0–34.0)
MCHC: 33.3 g/dL (ref 30.0–36.0)
MCV: 83 fL (ref 78.0–100.0)
Platelets: 242 10*3/uL (ref 150–400)
RBC: 2.82 MIL/uL — ABNORMAL LOW (ref 3.87–5.11)
RDW: 13.6 % (ref 11.5–15.5)
WBC: 18.7 10*3/uL — ABNORMAL HIGH (ref 4.0–10.5)

## 2014-08-10 LAB — SEDIMENTATION RATE: Sed Rate: 112 mm/hr — ABNORMAL HIGH (ref 0–22)

## 2014-08-10 LAB — RAPID URINE DRUG SCREEN, HOSP PERFORMED
AMPHETAMINES: NOT DETECTED
BARBITURATES: NOT DETECTED
Benzodiazepines: NOT DETECTED
Cocaine: NOT DETECTED
Opiates: POSITIVE — AB
Tetrahydrocannabinol: NOT DETECTED

## 2014-08-10 LAB — GLUCOSE, CAPILLARY
GLUCOSE-CAPILLARY: 119 mg/dL — AB (ref 65–99)
GLUCOSE-CAPILLARY: 147 mg/dL — AB (ref 65–99)
Glucose-Capillary: 153 mg/dL — ABNORMAL HIGH (ref 65–99)
Glucose-Capillary: 126 mg/dL — ABNORMAL HIGH (ref 65–99)

## 2014-08-10 LAB — RETICULOCYTES
RBC.: 2.92 MIL/uL — ABNORMAL LOW (ref 3.87–5.11)
RETIC COUNT ABSOLUTE: 40.9 10*3/uL (ref 19.0–186.0)
Retic Ct Pct: 1.4 % (ref 0.4–3.1)

## 2014-08-10 LAB — TSH
TSH: 25.942 u[IU]/mL — ABNORMAL HIGH (ref 0.350–4.500)
TSH: 25.005 u[IU]/mL — ABNORMAL HIGH (ref 0.350–4.500)

## 2014-08-10 LAB — ABO/RH: ABO/RH(D): O POS

## 2014-08-10 LAB — PHOSPHORUS: Phosphorus: 4.8 mg/dL — ABNORMAL HIGH (ref 2.5–4.6)

## 2014-08-10 LAB — IRON AND TIBC
Iron: 9 ug/dL — ABNORMAL LOW (ref 28–170)
Saturation Ratios: 5 % — ABNORMAL LOW (ref 10.4–31.8)
TIBC: 179 ug/dL — AB (ref 250–450)
UIBC: 170 ug/dL

## 2014-08-10 LAB — LACTIC ACID, PLASMA
LACTIC ACID, VENOUS: 0.6 mmol/L (ref 0.5–2.0)
LACTIC ACID, VENOUS: 0.6 mmol/L (ref 0.5–2.0)

## 2014-08-10 LAB — CK: Total CK: 298 U/L — ABNORMAL HIGH (ref 38–234)

## 2014-08-10 LAB — HIV ANTIBODY (ROUTINE TESTING W REFLEX): HIV Screen 4th Generation wRfx: NONREACTIVE

## 2014-08-10 LAB — FERRITIN: Ferritin: 127 ng/mL (ref 11–307)

## 2014-08-10 LAB — T4, FREE: FREE T4: 0.96 ng/dL (ref 0.61–1.12)

## 2014-08-10 LAB — APTT: APTT: 42 s — AB (ref 24–37)

## 2014-08-10 LAB — PROTIME-INR
INR: 1.31 (ref 0.00–1.49)
Prothrombin Time: 16.4 seconds — ABNORMAL HIGH (ref 11.6–15.2)

## 2014-08-10 LAB — C-REACTIVE PROTEIN: CRP: 28.5 mg/dL — ABNORMAL HIGH (ref <1.0)

## 2014-08-10 LAB — TYPE AND SCREEN
ABO/RH(D): O POS
ANTIBODY SCREEN: NEGATIVE

## 2014-08-10 LAB — FOLATE: Folate: 12.8 ng/mL (ref >5.9)

## 2014-08-10 LAB — MAGNESIUM: MAGNESIUM: 2 mg/dL (ref 1.7–2.4)

## 2014-08-10 LAB — VITAMIN B12: VITAMIN B 12: 545 pg/mL (ref 180–914)

## 2014-08-10 LAB — CREATININE, URINE, RANDOM: Creatinine, Urine: 115.16 mg/dL

## 2014-08-10 LAB — PROCALCITONIN: PROCALCITONIN: 0.58 ng/mL

## 2014-08-10 MED ORDER — INSULIN GLARGINE 100 UNIT/ML ~~LOC~~ SOLN
12.0000 [IU] | Freq: Every day | SUBCUTANEOUS | Status: DC
  Administered 2014-08-10 – 2014-08-12 (×3): 12 [IU] via SUBCUTANEOUS
  Filled 2014-08-10 (×3): qty 0.12

## 2014-08-10 MED ORDER — LEVOTHYROXINE SODIUM 125 MCG PO TABS
125.0000 ug | ORAL_TABLET | Freq: Every day | ORAL | Status: DC
  Administered 2014-08-11 – 2014-08-17 (×7): 125 ug via ORAL
  Filled 2014-08-10 (×9): qty 1

## 2014-08-10 NOTE — Progress Notes (Signed)
VASCULAR LAB PRELIMINARY  PRELIMINARY  PRELIMINARY  PRELIMINARY  Right lower extremity venous duplex completed.    Preliminary report:  Right:  No evidence of DVT, superficial thrombosis, or Baker's cyst. Technically difficult due to pain. Patient unable to tolerate the compressions portion of the exam however there was normal color flow and Doppler signals throughout.  Yousif Edelson, RVS 08/10/2014, 11:10 AM

## 2014-08-10 NOTE — Consult Note (Signed)
ORTHOPAEDIC CONSULTATION  REQUESTING PHYSICIAN: Thurnell Lose, MD  Chief Complaint: R leg and R arm pain.   HPI: Tracy [name] is a 29 y.o. female who was throwing bags of course food 2 weeks ago afterwards she felt soreness and pain in her right leg and her posterior right arm and shoulder. This slowly got better but then worsened over the next 4-5 days. She saw Dr. Aline Brochure who got an MRI of her arm and referred her to the emergency Department after noting inflammation within her muscle. She then eventually made her way to come where she was admitted. She had worsening pain over the last 5 days but it does feel slightly better today than yesterday. She has not felt ill but her appetite has decreased slightly over the last few days however she attributes this to the pain meds. She denies any fevers over the last 2 weeks.  She does have type 1 diabetes that she has admittedly not taking great care of at times. She denies tobacco use.  Past Medical History  Diagnosis Date  . Thyroid enlargement     "not on medication at this time"  . Urinary tract infection     hx of  . Anemia     presently on iron supplement  . Diabetes mellitus     insulin pump   . Hypothyroidism   . Anxiety   . HSV-2 (herpes simplex virus 2) infection   . HSV-1 (herpes simplex virus 1) infection   . Detached retina   . Yeast infection     took diflucan saturday   . Hypertension   . Irregular periods 07/05/2014  . Vaginal discharge 07/05/2014   Past Surgical History  Procedure Laterality Date  . Cholecystectomy    . Pilonidal cyst excision    . Wisdom tooth extraction    . Pars plana vitrectomy  10/01/2011    Procedure: PARS PLANA VITRECTOMY WITH 25 GAUGE;  Surgeon: Hayden Pedro, MD;  Location: [facility];  Service: Ophthalmology;  Laterality: Right;  Repair Complex Traction Retinal Detachment  . Trigger finger release Right 09/21/13   History   Social History  . Marital Status: Legally  Separated    Spouse Name: Audelia Acton  . Number of Children: 0  . Years of Education: College   Occupational History  .  [employer]   Social History Main Topics  . Smoking status: Never Smoker   . Smokeless tobacco: Never Used  . Alcohol Use: No  . Drug Use: No  . Sexual Activity: Not Currently    Birth Control/ Protection: None   Other Topics Concern  . None   Social History Narrative   Patient lives at home with family.   Caffeine Use: 1 soda daily   Family History  Problem Relation Age of Onset  . Cancer Paternal Grandfather     prostate  . Hyperlipidemia Paternal Grandfather   . Stroke Paternal Grandfather    Allergies  Allergen Reactions  . Penicillins Hives and Swelling  . Sulfa Antibiotics Hives   Prior to Admission medications   Medication Sig Start Date End Date Taking? Authorizing Provider  acyclovir (ZOVIRAX) 400 MG tablet Take 400 mg by mouth 2 (two) times daily as needed. Fever blisters   Yes Historical Provider, MD  amLODipine (NORVASC) 10 MG tablet Take 10 mg by mouth daily.   Yes Historical Provider, MD  furosemide (LASIX) 20 MG tablet Take 20 mg by mouth 2 (two) times daily as  needed for fluid.    Yes Historical Provider, MD  HYDROcodone-acetaminophen (NORCO/VICODIN) 5-325 MG per tablet Take 1 tablet by mouth every 4 (four) hours as needed for moderate pain. 08/08/14  Yes Carole Civil, MD  ibuprofen (ADVIL,MOTRIN) 800 MG tablet Take 1 tablet (800 mg total) by mouth 3 (three) times daily. 08/05/14  Yes Orpah Greek, MD  insulin aspart (NOVOLOG FLEXPEN) 100 UNIT/ML FlexPen Inject 5 Units into the skin 3 (three) times daily with meals.    Yes Historical Provider, MD  Insulin Glargine (LANTUS SOLOSTAR) 100 UNIT/ML SOPN Inject 28 Units into the skin at bedtime. Patient taking differently: Inject 15 Units into the skin every morning.  01/22/13  Yes Samuella Cota, MD  levothyroxine (SYNTHROID, LEVOTHROID) 88 MCG tablet Take 1 tablet (88 mcg total) by mouth  daily before breakfast. 01/25/14  Yes Verlee Monte, MD  Multiple Vitamins-Minerals (MULTI-VITAMIN GUMMIES PO) Take by mouth daily.   Yes Historical Provider, MD  promethazine (PHENERGAN) 12.5 MG tablet Take 1 tablet (12.5 mg total) by mouth every 4 (four) hours as needed for nausea or vomiting. 08/08/14  Yes Carole Civil, MD  fluconazole (DIFLUCAN) 150 MG tablet Take 1 now and 1 in 3 days Patient not taking: Reported on 08/09/2014 07/05/14   Estill Dooms, NP   Mr Humerus Right Wo Contrast  08/10/2014   CLINICAL DATA:  Right arm pain after an injury throwing a bag of horse feed 2 weeks ago.  EXAM: MRI OF THE RIGHT HUMERUS WITHOUT CONTRAST  TECHNIQUE: Multiplanar, multisequence MR imaging was performed. No intravenous contrast was administered.  COMPARISON:  MRI dated 08/08/2014 and radiograph dated 05/05/2014  FINDINGS: The edema in the right triceps muscle is even more extensive than on the prior study. The muscle is larger than on the prior study. There is only slight sparing of the proximal aspect of the muscle. Subcutaneous edema is more prominent. The underlying humerus and the neurovascular bundle are normal. The focal area of abnormal signal at the distal mild tendinous junction is unchanged.  IMPRESSION: Progressive edema of the triceps muscle consistent with myositis. Focal area of abnormal signal at the mild tendinous junction could represent a tear or small abscess but this is not progressed. Given the patient's elevated white blood count, infectious myositis must be considered.   Electronically Signed   By: Lorriane Shire M.D.   On: 08/10/2014 08:50   Mr Humerus Right Wo Contrast  08/09/2014   CLINICAL DATA:  Pain throughout entire right humerus for 2 weeks.  EXAM: MRI OF THE RIGHT HUMERUS WITHOUT CONTRAST  TECHNIQUE: Multiplanar, multisequence MR imaging was performed. No intravenous contrast was administered.  COMPARISON:  None.  FINDINGS: No marrow signal abnormality. No fracture or  dislocation. There is no bone destruction or periosteal reaction.  There is severe edema within the right triceps muscle with small areas of sparing proximally. At the distal musculotendinous junction of the medial head of the triceps, there is a complex 2 x 2 x 2.3 cm T2 signal abnormality with central T2 hyperintensity and peripheral intermediate signal. There is mild edema in the adjacent subcutaneous fat.  The biceps musculature is normal. There is no other fluid collection or soft tissue mass. The neurovascular bundles are grossly normal. There are multiple prominent right axillary lymph nodes.  IMPRESSION: 1. Severe edema within the right triceps muscle with small areas of sparing proximally. At the distal musculotendinous junction of the medial head of the triceps, there is a complex  2 x 2 x 2.3 cm T2 signal abnormality. The overall appearance is concerning for myositis which may be secondary to an infectious or inflammatory etiology. The 2 cm area of focal abnormal signal which may reflect a hematoma secondary to a partial tear given the location is at the musculotendinous junction of the medial head of the triceps, but given the extensiveness of the muscle edema, this may reflect a developing abscess versus myonecrosis.   Electronically Signed   By: Kathreen Devoid   On: 08/09/2014 08:31   US Renal  08/10/2014   CLINICAL DATA:  Acute kidney injury.  Elevated creatinine level.  EXAM: RENAL / URINARY TRACT ULTRASOUND COMPLETE  COMPARISON:  CT examination 03/09/2013 and ultrasound 10/04/2008  FINDINGS: Right Kidney:  Length: 10.6 cm. The right kidney parenchyma is echogenic without hydronephrosis. There is a heterogeneous hypoechoic structure along the upper pole measuring up to 1.1 cm. Previously, the patient had an abscess in the right kidney and unclear if this sequela from the abscess or a new small complex lesion.  Left Kidney:  Length: 10.6 cm. Left kidney is not well visualized but appears to be  echogenic. Negative for hydronephrosis.  Bladder:  Appears normal for degree of bladder distention.  IMPRESSION: Negative for hydronephrosis.  Increased echogenicity of both kidneys. Findings raise concern for chronic medical renal disease.  There is a 1.1 cm heterogeneous hypoechoic structure in the right kidney upper pole. Unclear if this is the sequelae of previous renal abscess versus new small complex cystic lesion. Evaluation of this area will be difficult due to acute kidney injury and an elevated creatinine level. However, this may be better characterized with a non contrast MRI versus close surveillance with ultrasound.   Electronically Signed   By: Markus Daft M.D.   On: 08/10/2014 11:34   Ct Femur Right Wo Contrast  08/10/2014   CLINICAL DATA:  Myositis. Sudden onset of pain while lifting a feed bag 2 weeks ago.  EXAM: CT OF THE LOWER RIGHT EXTREMITY WITHOUT CONTRAST  TECHNIQUE: Multidetector CT imaging of the right femur and right tibia fibula was performed according to the standard protocol.  COMPARISON:  None.  FINDINGS: There is no bony abnormality. There is no radiopaque foreign body. There is no soft tissue mass. There is no abscess or drainable fluid collection. There is no soft tissue gas.  There is soft tissue edema around the muscles of the right lower extremity, particularly around the adductor of the via and the quadriceps musculature. This is consistent with the clinically described suspicion of myositis. There is a small knee joint effusion.  IMPRESSION: Soft tissue edema surrounding the musculature of the right lower extremity consistent with myositis. No mass or drainable fluid collection.   Electronically Signed   By: Andreas Newport M.D.   On: 08/10/2014 03:49   Ct Tibia Fibula Right Wo Contrast  08/10/2014   CLINICAL DATA:  Myositis. Sudden onset of pain while lifting a feed bag 2 weeks ago.  EXAM: CT OF THE LOWER RIGHT EXTREMITY WITHOUT CONTRAST  TECHNIQUE: Multidetector CT  imaging of the right femur and right tibia fibula was performed according to the standard protocol.  COMPARISON:  None.  FINDINGS: There is no bony abnormality. There is no radiopaque foreign body. There is no soft tissue mass. There is no abscess or drainable fluid collection. There is no soft tissue gas.  There is soft tissue edema around the muscles of the right lower extremity, particularly around the adductor of  the via and the quadriceps musculature. This is consistent with the clinically described suspicion of myositis. There is a small knee joint effusion.  IMPRESSION: Soft tissue edema surrounding the musculature of the right lower extremity consistent with myositis. No mass or drainable fluid collection.   Electronically Signed   By: Andreas Newport M.D.   On: 08/10/2014 03:49   Dg Femur, Min 2 Views Right  08/10/2014   CLINICAL DATA:  Right thigh pain. Concern for myositis. Rule out soft tissue air.  EXAM: RIGHT FEMUR 2 VIEWS  COMPARISON:  None.  FINDINGS: Cortical margins of the right femur are intact. No fracture, focal osseous lesion, erosion or periosteal reaction. No soft tissue air or radiopaque foreign body. Questionable soft tissue edema proximally.  IMPRESSION: Suspect mild soft tissue edema proximally. No soft tissue air, radiopaque foreign body, or acute osseous abnormality.   Electronically Signed   By: Jeb Levering M.D.   On: 08/10/2014 01:48    Positive ROS: All other systems have been reviewed and were otherwise negative with the exception of those mentioned in the HPI and as above.  Labs cbc  Recent Labs  08/09/14 1819 08/10/14 0128  WBC 22.6* 18.7*  HGB 9.5* 7.8*  HCT 28.5* 23.4*  PLT 308 242    Labs inflam  Recent Labs  08/09/14 2350  CRP 28.5*    Labs coag  Recent Labs  08/09/14 2350  INR 1.31     Recent Labs  08/09/14 1819 08/10/14 0128  NA 136 135  K 4.5 4.0  CL 105 103  CO2 22 21*  GLUCOSE 68 101*  BUN 41* 39*  CREATININE 5.46*  5.33*  CALCIUM 9.0 8.2*    Physical Exam: Filed Vitals:   08/10/14 0912  BP: 168/80  Pulse: 96  Temp: 98.4 F (36.9 C)  Resp: 20   General: Alert, no acute distress Cardiovascular: No pedal edema Respiratory: No cyanosis, no use of accessory musculature GI: No organomegaly, abdomen is soft and non-tender Skin: No lesions in the area of chief complaint other than those listed below in MSK exam.  Neurologic: Sensation intact distally Psychiatric: Patient is competent for consent with normal mood and affect Lymphatic: No axillary or cervical lymphadenopathy  MUSCULOSKELETAL:  RUE: She has a small amount of swelling and soreness over her triceps. She is able to forward elevate her arm roughly 90 I am able to flex and extend her elbow. She has tenderness over her triceps is well. Her compartments are very soft and there is no erythema or warmth. Distally she is neurovascularly intact in all nerve distribution  On her right lower extremity she has some fullness in her abductor region with some tenderness in this region as well. Again there is no erythema there is no warmth. I see no skin breaks. Distally she is neurovascularly intact all her compartments are very soft.  Other extremities are atraumatic with painless ROM and NVI.  Assessment: Myositis RUE, RLE  Plan: She seems to have improved slightly from yesterday to today I recommend continued antibody treatment with close observation as well as medical optimization. Looking back she has had some very high hemoglobin A1c's and her kidney function seems to be limited which may be causing this delayed clearance of inflammation from her muscle.  I do not see any fluid collections on advanced imaging to warrant surgical I and D we have not MRI the leg so if she does not continue to improve that may be worth doing.  I will continue to follow.     Renette Butters, MD Cell [phone]   08/10/2014 2:21 PM

## 2014-08-10 NOTE — Progress Notes (Signed)
Patient ID: Tracy [name], female   DOB: 1985/09/21, 29 y.o.   MRN: [id number]    Subjective: Pt still having horrible posterior right arm, elbow pain as well as pain in her right thigh  Objective: Vital signs in last 24 hours: Temp:  [98.4 F (36.9 C)-99.1 F (37.3 C)] 98.4 F (36.9 C) (06/29 0912) Pulse Rate:  [89-115] 96 (06/29 0912) Resp:  [10-20] 20 (06/29 0912) BP: (156-204)/(75-103) 168/80 mmHg (06/29 0912) SpO2:  [97 %-100 %] 100 % (06/29 0912) Weight:  [68.04 kg (150 lb)] 68.04 kg (150 lb) (06/28 2301)    Intake/Output from previous day: 06/28 0701 - 06/29 0700 In: 2299.2 [P.O.:270; I.V.:29.2; IV Piggyback:2000] Out: -  Intake/Output this shift:    PE: Ext: right upper ext edematous and very tender, more tender posteriorly.  No erythema or evidence of skin necrosis.  +2 radial pulse, compartments soft.  RLE thigh with some edema and induration of the medial/anterior portion of her thigh.  Once again no erythema, fluctuance, or skin necrosis  Lab Results:   Recent Labs  08/09/14 1819 08/10/14 0128  WBC 22.6* 18.7*  HGB 9.5* 7.8*  HCT 28.5* 23.4*  PLT 308 242   BMET  Recent Labs  08/09/14 1819 08/10/14 0128  NA 136 135  K 4.5 4.0  CL 105 103  CO2 22 21*  GLUCOSE 68 101*  BUN 41* 39*  CREATININE 5.46* 5.33*  CALCIUM 9.0 8.2*   PT/INR  Recent Labs  08/09/14 2350  LABPROT 16.4*  INR 1.31   CMP     Component Value Date/Time   NA 135 08/10/2014 0128   NA 133* 01/18/2013 1106   K 4.0 08/10/2014 0128   CL 103 08/10/2014 0128   CO2 21* 08/10/2014 0128   GLUCOSE 101* 08/10/2014 0128   GLUCOSE 929* 01/18/2013 1106   BUN 39* 08/10/2014 0128   BUN 37* 01/18/2013 1106   CREATININE 5.33* 08/10/2014 0128   CALCIUM 8.2* 08/10/2014 0128   PROT 5.7* 08/10/2014 0128   PROT 6.3 01/18/2013 1106   ALBUMIN 2.2* 08/10/2014 0128   AST 38 08/10/2014 0128   ALT 18 08/10/2014 0128   ALKPHOS 166* 08/10/2014 0128   BILITOT 0.6 08/10/2014 0128   GFRNONAA  10* 08/10/2014 0128   GFRAA 11* 08/10/2014 0128   Lipase     Component Value Date/Time   LIPASE <10* 10/04/2008 1200       Studies/Results: Mr Humerus Right Wo Contrast  08/10/2014   CLINICAL DATA:  Right arm pain after an injury throwing a bag of horse feed 2 weeks ago.  EXAM: MRI OF THE RIGHT HUMERUS WITHOUT CONTRAST  TECHNIQUE: Multiplanar, multisequence MR imaging was performed. No intravenous contrast was administered.  COMPARISON:  MRI dated 08/08/2014 and radiograph dated 05/05/2014  FINDINGS: The edema in the right triceps muscle is even more extensive than on the prior study. The muscle is larger than on the prior study. There is only slight sparing of the proximal aspect of the muscle. Subcutaneous edema is more prominent. The underlying humerus and the neurovascular bundle are normal. The focal area of abnormal signal at the distal mild tendinous junction is unchanged.  IMPRESSION: Progressive edema of the triceps muscle consistent with myositis. Focal area of abnormal signal at the mild tendinous junction could represent a tear or small abscess but this is not progressed. Given the patient's elevated white blood count, infectious myositis must be considered.   Electronically Signed   By: Lorriane Shire M.D.  On: 08/10/2014 08:50   Mr Humerus Right Wo Contrast  08/09/2014   CLINICAL DATA:  Pain throughout entire right humerus for 2 weeks.  EXAM: MRI OF THE RIGHT HUMERUS WITHOUT CONTRAST  TECHNIQUE: Multiplanar, multisequence MR imaging was performed. No intravenous contrast was administered.  COMPARISON:  None.  FINDINGS: No marrow signal abnormality. No fracture or dislocation. There is no bone destruction or periosteal reaction.  There is severe edema within the right triceps muscle with small areas of sparing proximally. At the distal musculotendinous junction of the medial head of the triceps, there is a complex 2 x 2 x 2.3 cm T2 signal abnormality with central T2 hyperintensity and  peripheral intermediate signal. There is mild edema in the adjacent subcutaneous fat.  The biceps musculature is normal. There is no other fluid collection or soft tissue mass. The neurovascular bundles are grossly normal. There are multiple prominent right axillary lymph nodes.  IMPRESSION: 1. Severe edema within the right triceps muscle with small areas of sparing proximally. At the distal musculotendinous junction of the medial head of the triceps, there is a complex 2 x 2 x 2.3 cm T2 signal abnormality. The overall appearance is concerning for myositis which may be secondary to an infectious or inflammatory etiology. The 2 cm area of focal abnormal signal which may reflect a hematoma secondary to a partial tear given the location is at the musculotendinous junction of the medial head of the triceps, but given the extensiveness of the muscle edema, this may reflect a developing abscess versus myonecrosis.   Electronically Signed   By: Kathreen Devoid   On: 08/09/2014 08:31   Ct Femur Right Wo Contrast  08/10/2014   CLINICAL DATA:  Myositis. Sudden onset of pain while lifting a feed bag 2 weeks ago.  EXAM: CT OF THE LOWER RIGHT EXTREMITY WITHOUT CONTRAST  TECHNIQUE: Multidetector CT imaging of the right femur and right tibia fibula was performed according to the standard protocol.  COMPARISON:  None.  FINDINGS: There is no bony abnormality. There is no radiopaque foreign body. There is no soft tissue mass. There is no abscess or drainable fluid collection. There is no soft tissue gas.  There is soft tissue edema around the muscles of the right lower extremity, particularly around the adductor of the via and the quadriceps musculature. This is consistent with the clinically described suspicion of myositis. There is a small knee joint effusion.  IMPRESSION: Soft tissue edema surrounding the musculature of the right lower extremity consistent with myositis. No mass or drainable fluid collection.   Electronically  Signed   By: Andreas Newport M.D.   On: 08/10/2014 03:49   Ct Tibia Fibula Right Wo Contrast  08/10/2014   CLINICAL DATA:  Myositis. Sudden onset of pain while lifting a feed bag 2 weeks ago.  EXAM: CT OF THE LOWER RIGHT EXTREMITY WITHOUT CONTRAST  TECHNIQUE: Multidetector CT imaging of the right femur and right tibia fibula was performed according to the standard protocol.  COMPARISON:  None.  FINDINGS: There is no bony abnormality. There is no radiopaque foreign body. There is no soft tissue mass. There is no abscess or drainable fluid collection. There is no soft tissue gas.  There is soft tissue edema around the muscles of the right lower extremity, particularly around the adductor of the via and the quadriceps musculature. This is consistent with the clinically described suspicion of myositis. There is a small knee joint effusion.  IMPRESSION: Soft tissue edema surrounding the musculature  of the right lower extremity consistent with myositis. No mass or drainable fluid collection.   Electronically Signed   By: Andreas Newport M.D.   On: 08/10/2014 03:49   Dg Femur, Min 2 Views Right  08/10/2014   CLINICAL DATA:  Right thigh pain. Concern for myositis. Rule out soft tissue air.  EXAM: RIGHT FEMUR 2 VIEWS  COMPARISON:  None.  FINDINGS: Cortical margins of the right femur are intact. No fracture, focal osseous lesion, erosion or periosteal reaction. No soft tissue air or radiopaque foreign body. Questionable soft tissue edema proximally.  IMPRESSION: Suspect mild soft tissue edema proximally. No soft tissue air, radiopaque foreign body, or acute osseous abnormality.   Electronically Signed   By: Jeb Levering M.D.   On: 08/10/2014 01:48    Anti-infectives: Anti-infectives    Start     Dose/Rate Route Frequency Ordered Stop   08/11/14 2200  vancomycin (VANCOCIN) IVPB 1000 mg/200 mL premix     1,000 mg 200 mL/hr over 60 Minutes Intravenous Every 48 hours 08/09/14 2256     08/10/14 1045   vancomycin (VANCOCIN) IVPB 750 mg/150 ml premix  Status:  Discontinued     750 mg 150 mL/hr over 60 Minutes Intravenous Every 12 hours 08/09/14 2252 08/09/14 2256   08/09/14 2230  aztreonam (AZACTAM) 1 g in dextrose 5 % 50 mL IVPB     1 g 100 mL/hr over 30 Minutes Intravenous Every 8 hours 08/09/14 2215     08/09/14 2200  vancomycin (VANCOCIN) IVPB 1000 mg/200 mL premix  Status:  Discontinued     1,000 mg 200 mL/hr over 60 Minutes Intravenous Every 48 hours 08/09/14 2159 08/09/14 2252   08/09/14 2145  vancomycin (VANCOCIN) IVPB 1000 mg/200 mL premix  Status:  Discontinued     1,000 mg 200 mL/hr over 60 Minutes Intravenous  Once 08/09/14 2137 08/09/14 2147       Assessment/Plan   1. Myositis of RUE and RLE -MRI results noted.  The patient does not appear to have any evidence of nec fasc right now.  Will defer further treatment and decisions to primary and ortho. -given her findings are in UE and LE, ? Some type of systemic response, rheumatologic, infectious, etc -nothing else to add currently from a general surgery standpoint.   LOS: 1 day    Ilija Maxim E 08/10/2014, 10:24 AM Pager: [phone]

## 2014-08-10 NOTE — Consult Note (Signed)
Reason for Consult:RLE pain Referring Physician: Dr. Elizebeth Koller is an 29 y.o. female.  HPI: This patient is a 29 year old female who has a significant history for DM-1 and hypertension. The patient states that approximately 2 weeks ago she was lifting a bag of worse feed and elicited with her right arm and leg to empty the sac. She states that since that time she has had right upper extremity and lower extremity pain and decreased range of motion. She states that the pain initially improved however returned recently. Patient underwent an MRI of her right upper extremity day prior to admission which revealed myositis of her upper extremity. She was just recommended for hospital admission.  Patient states that she has had no trauma, and/or to her right upper or lower extremity otherwise.   Past Medical History  Diagnosis Date  . Thyroid enlargement     "not on medication at this time"  . Urinary tract infection     hx of  . Anemia     presently on iron supplement  . Diabetes mellitus     insulin pump   . Hypothyroidism   . Anxiety   . HSV-2 (herpes simplex virus 2) infection   . HSV-1 (herpes simplex virus 1) infection   . Detached retina   . Yeast infection     took diflucan saturday   . Hypertension   . Irregular periods 07/05/2014  . Vaginal discharge 07/05/2014    Past Surgical History  Procedure Laterality Date  . Cholecystectomy    . Pilonidal cyst excision    . Wisdom tooth extraction    . Pars plana vitrectomy  10/01/2011    Procedure: PARS PLANA VITRECTOMY WITH 25 GAUGE;  Surgeon: Hayden Pedro, MD;  Location: [facility];  Service: Ophthalmology;  Laterality: Right;  Repair Complex Traction Retinal Detachment  . Trigger finger release Right 09/21/13    Family History  Problem Relation Age of Onset  . Cancer Paternal Grandfather     prostate  . Hyperlipidemia Paternal Grandfather   . Stroke Paternal Grandfather     Social History:  reports that she has  never smoked. She has never used smokeless tobacco. She reports that she does not drink alcohol or use illicit drugs.  Allergies:  Allergies  Allergen Reactions  . Penicillins Hives and Swelling  . Sulfa Antibiotics Hives    Medications: I have reviewed the patient's current medications.  Results for orders placed or performed during the hospital encounter of 08/09/14 (from the past 48 hour(s))  Urinalysis, Routine w reflex microscopic (not at [facility])     Status: Abnormal   Collection Time: 08/09/14  1:57 PM  Result Value Ref Range   Color, Urine YELLOW YELLOW   APPearance CLOUDY (A) CLEAR   Specific Gravity, Urine 1.018 1.005 - 1.030   pH 5.0 5.0 - 8.0   Glucose, UA 100 (A) NEGATIVE mg/dL   Hgb urine dipstick MODERATE (A) NEGATIVE   Bilirubin Urine NEGATIVE NEGATIVE   Ketones, ur NEGATIVE NEGATIVE mg/dL   Protein, ur >300 (A) NEGATIVE mg/dL   Urobilinogen, UA 0.2 0.0 - 1.0 mg/dL   Nitrite NEGATIVE NEGATIVE   Leukocytes, UA NEGATIVE NEGATIVE  Urine microscopic-add on     Status: Abnormal   Collection Time: 08/09/14  1:57 PM  Result Value Ref Range   Squamous Epithelial / LPF MANY (A) RARE   WBC, UA 0-2 <3 WBC/hpf   RBC / HPF 0-2 <3 RBC/hpf   Bacteria, UA  RARE RARE   Casts GRANULAR CAST (A) NEGATIVE   Urine-Other AMORPHOUS URATES/PHOSPHATES   Comprehensive metabolic panel     Status: Abnormal   Collection Time: 08/09/14  6:19 PM  Result Value Ref Range   Sodium 136 135 - 145 mmol/L   Potassium 4.5 3.5 - 5.1 mmol/L   Chloride 105 101 - 111 mmol/L   CO2 22 22 - 32 mmol/L   Glucose, Bld 68 65 - 99 mg/dL   BUN 41 (H) 6 - 20 mg/dL   Creatinine, Ser 5.46 (H) 0.44 - 1.00 mg/dL   Calcium 9.0 8.9 - 10.3 mg/dL   Total Protein 7.6 6.5 - 8.1 g/dL   Albumin 2.9 (L) 3.5 - 5.0 g/dL   AST 28 15 - 41 U/L   ALT 15 14 - 54 U/L   Alkaline Phosphatase 127 (H) 38 - 126 U/L   Total Bilirubin 0.5 0.3 - 1.2 mg/dL   GFR calc non Af Amer 10 (L) >60 mL/min   GFR calc Af Amer 11 (L) >60 mL/min     Comment: (NOTE) The eGFR has been calculated using the CKD EPI equation. This calculation has not been validated in all clinical situations. eGFR's persistently <60 mL/min signify possible Chronic Kidney Disease.    Anion gap 9 5 - 15  CBC with Differential     Status: Abnormal   Collection Time: 08/09/14  6:19 PM  Result Value Ref Range   WBC 22.6 (H) 4.0 - 10.5 K/uL   RBC 3.45 (L) 3.87 - 5.11 MIL/uL   Hemoglobin 9.5 (L) 12.0 - 15.0 g/dL   HCT 28.5 (L) 36.0 - 46.0 %   MCV 82.6 78.0 - 100.0 fL   MCH 27.5 26.0 - 34.0 pg   MCHC 33.3 30.0 - 36.0 g/dL   RDW 13.6 11.5 - 15.5 %   Platelets 308 150 - 400 K/uL   Neutrophils Relative % 86 (H) 43 - 77 %   Neutro Abs 19.5 (H) 1.7 - 7.7 K/uL   Lymphocytes Relative 7 (L) 12 - 46 %   Lymphs Abs 1.5 0.7 - 4.0 K/uL   Monocytes Relative 7 3 - 12 %   Monocytes Absolute 1.5 (H) 0.1 - 1.0 K/uL   Eosinophils Relative 0 0 - 5 %   Eosinophils Absolute 0.0 0.0 - 0.7 K/uL   Basophils Relative 0 0 - 1 %   Basophils Absolute 0.0 0.0 - 0.1 K/uL  I-Stat CG4 Lactic Acid, ED (Not at [facility] or [facility])     Status: None   Collection Time: 08/09/14  7:07 PM  Result Value Ref Range   Lactic Acid, Venous 1.35 0.5 - 2.0 mmol/L  CK     Status: Abnormal   Collection Time: 08/09/14  8:33 PM  Result Value Ref Range   Total CK 257 (H) 38 - 234 U/L  I-Stat CG4 Lactic Acid, ED (Not at [facility] or [facility])     Status: None   Collection Time: 08/09/14  9:54 PM  Result Value Ref Range   Lactic Acid, Venous 0.59 0.5 - 2.0 mmol/L  Glucose, capillary     Status: None   Collection Time: 08/09/14 11:20 PM  Result Value Ref Range   Glucose-Capillary 93 65 - 99 mg/dL    Mr Humerus Right Wo Contrast  08/09/2014   CLINICAL DATA:  Pain throughout entire right humerus for 2 weeks.  EXAM: MRI OF THE RIGHT HUMERUS WITHOUT CONTRAST  TECHNIQUE: Multiplanar, multisequence MR imaging was performed. No intravenous  contrast was administered.  COMPARISON:  None.  FINDINGS: No marrow signal  abnormality. No fracture or dislocation. There is no bone destruction or periosteal reaction.  There is severe edema within the right triceps muscle with small areas of sparing proximally. At the distal musculotendinous junction of the medial head of the triceps, there is a complex 2 x 2 x 2.3 cm T2 signal abnormality with central T2 hyperintensity and peripheral intermediate signal. There is mild edema in the adjacent subcutaneous fat.  The biceps musculature is normal. There is no other fluid collection or soft tissue mass. The neurovascular bundles are grossly normal. There are multiple prominent right axillary lymph nodes.  IMPRESSION: 1. Severe edema within the right triceps muscle with small areas of sparing proximally. At the distal musculotendinous junction of the medial head of the triceps, there is a complex 2 x 2 x 2.3 cm T2 signal abnormality. The overall appearance is concerning for myositis which may be secondary to an infectious or inflammatory etiology. The 2 cm area of focal abnormal signal which may reflect a hematoma secondary to a partial tear given the location is at the musculotendinous junction of the medial head of the triceps, but given the extensiveness of the muscle edema, this may reflect a developing abscess versus myonecrosis.   Electronically Signed   By: Kathreen Devoid   On: 08/09/2014 08:31    Review of Systems  Constitutional: Negative.   HENT: Negative.   Respiratory: Negative.   Cardiovascular: Negative.   Gastrointestinal: Negative.   Musculoskeletal:       RUE/RLE pain x 2 weeks   Skin: Negative.   Neurological: Negative.    Blood pressure 173/82, pulse 94, temperature 98.7 F (37.1 C), temperature source Oral, resp. rate 16, weight 68.04 kg (150 lb), last menstrual period 07/18/2014, SpO2 98 %. Physical Exam  Constitutional: She is oriented to person, place, and time. She appears well-developed and well-nourished.  HENT:  Head: Normocephalic and atraumatic.   Eyes: Conjunctivae and EOM are normal. Pupils are equal, round, and reactive to light.  Neck: Normal range of motion.  Cardiovascular: Normal rate, regular rhythm and normal heart sounds.   Respiratory: Effort normal and breath sounds normal.  GI: Bowel sounds are normal. She exhibits no distension. There is no tenderness. There is no rebound.  Musculoskeletal:       Right elbow: She exhibits decreased range of motion and swelling. Tenderness (from biceps to just distal to elbow) found.       Right knee: She exhibits decreased range of motion and swelling. Tenderness (Medial thigh, Neg Homan's sign) found.       Left knee: Normal. She exhibits normal range of motion and no swelling. No tenderness found.  Neurological: She is alert and oriented to person, place, and time.  Skin: Skin is warm and dry.    Assessment/Plan: 29 year old female with myositis the right upper extremity. Patient does have pain without erythema to the right lower extremity. A plain film of her right lower extremity is pending. Patient will likely need to have a CT scan of right lower extremity to rule out any gas in her quadriceps compartment. Considering the fact the patient is diabetic result was that high risk for a possible fasciitis type infection. At this time there is no erythema to her skin. I do not feel there is a need to emergently proceed to the operating room without further studies.  The patient is currently on antibiotics to include vancomycin and aztreonam.  1. We'll follow up with plain film of her right lower extremity, likely follow this up with a CT scan if there is no gas on plane film. 2. We will follow patient along with you. Rosario Jacks., Davielle Lingelbach 08/10/2014, 12:41 AM

## 2014-08-10 NOTE — Progress Notes (Addendum)
Patient Demographics:    Tracy [name], is a 29 y.o. female, DOB - 22-May-1985, [id number]  Admit date - 08/09/2014   Admitting Physician Ivor Costa, MD  Outpatient Primary MD for the patient is [name]  LOS - 1   Chief Complaint  Patient presents with  . Arm Pain        Subjective:    Tracy [name] today has, No headache, No chest pain, No abdominal pain - No Nausea, No new weakness tingling or numbness, No Cough - SOB. Continues to have right arm and right thigh pain.   Assessment  & Plan :     1. Myositis of the right arm and right thigh. Most likely post physical strain 2 weeks ago while throwing horse feed. Case discussed with rheumatologist Dr. Amil Amen over the phone, will check aldolase along with ANA levels, CK mildly elevated. For now supportive care only, general surgery and orthopedics both on board. If no improvement will consider biopsy. Routine supportive care with IV fluids and pain control for now. For now continue IV anti-biotics as infection has not completely been ruled out. Ultrasound of the right upper arm does not show a clot.   2. Hypothyroidism. TSH is greater than 25, Synthroid increased, monitor free T3 free T4 along with TSH.    3. Anemia - Check anemia panel worse with dilution from IV fluids. Monitor.    4.ARF of CKD 4 - hold Lasix - NSAIDs, IVF and monitor.    5. DM1 - present A1c is pending, on Lantus and sliding scale for now monitor CBGs.  CBG (last 3)   Recent Labs  08/09/14 2320 08/10/14 0850  GLUCAP 93 153*     Lab Results  Component Value Date   HGBA1C 16.8* 01/20/2013      Code Status : Full  Family Communication  : Mother bedside  Disposition Plan  : Home in 2-3 days  Consults  :  Orthopedics/hand surgery, general  surgery  Procedures  : Right upper extremity venous duplex. No DVT, MRI right arm, CT right thigh  DVT Prophylaxis  :   Heparin    Lab Results  Component Value Date   PLT 242 08/10/2014    Inpatient Medications  Scheduled Meds: . amLODipine  10 mg Oral Daily  . aztreonam  1 g Intravenous Q8H  . heparin  5,000 Units Subcutaneous 3 times per day  . insulin aspart  0-9 Units Subcutaneous TID WC  . insulin glargine  12 Units Subcutaneous Daily  . [START ON 08/11/2014] levothyroxine  125 mcg Oral QAC breakfast  . multivitamin with minerals  1 tablet Oral Daily  . sodium chloride  1,000 mL Intravenous Once  . sodium chloride  3 mL Intravenous Q12H  . [START ON 08/11/2014] vancomycin  1,000 mg Intravenous Q48H   Continuous Infusions: . sodium chloride 125 mL/hr (08/10/14 0146)   PRN Meds:.acetaminophen **OR** acetaminophen, alum & mag hydroxide-simeth, hydrALAZINE, HYDROcodone-acetaminophen, HYDROmorphone (DILAUDID) injection, ondansetron  Antibiotics  :     Anti-infectives    Start     Dose/Rate Route Frequency Ordered Stop   08/11/14 2200  vancomycin (VANCOCIN) IVPB 1000 mg/200 mL premix     1,000 mg 200 mL/hr over 60 Minutes Intravenous Every 48  hours 08/09/14 2256     08/10/14 1045  vancomycin (VANCOCIN) IVPB 750 mg/150 ml premix  Status:  Discontinued     750 mg 150 mL/hr over 60 Minutes Intravenous Every 12 hours 08/09/14 2252 08/09/14 2256   08/09/14 2230  aztreonam (AZACTAM) 1 g in dextrose 5 % 50 mL IVPB     1 g 100 mL/hr over 30 Minutes Intravenous Every 8 hours 08/09/14 2215     08/09/14 2200  vancomycin (VANCOCIN) IVPB 1000 mg/200 mL premix  Status:  Discontinued     1,000 mg 200 mL/hr over 60 Minutes Intravenous Every 48 hours 08/09/14 2159 08/09/14 2252   08/09/14 2145  vancomycin (VANCOCIN) IVPB 1000 mg/200 mL premix  Status:  Discontinued     1,000 mg 200 mL/hr over 60 Minutes Intravenous  Once 08/09/14 2137 08/09/14 2147        Objective:   Filed Vitals:     08/09/14 2145 08/09/14 2301 08/10/14 0559 08/10/14 0912  BP: 156/78 173/82 156/75 168/80  Pulse: 96 94 94 96  Temp:  98.7 F (37.1 C) 98.9 F (37.2 C) 98.4 F (36.9 C)  TempSrc:  Oral Oral Oral  Resp: 12 16 20 20   Weight:  68.04 kg (150 lb)    SpO2: 100% 98% 100% 100%    Wt Readings from Last 3 Encounters:  08/09/14 68.04 kg (150 lb)  08/08/14 68.493 kg (151 lb)  08/05/14 68.493 kg (151 lb)     Intake/Output Summary (Last 24 hours) at 08/10/14 1104 Last data filed at 08/10/14 0451  Gross per 24 hour  Intake 2299.17 ml  Output      0 ml  Net 2299.17 ml     Physical Exam  Awake Alert, Oriented X 3, No new F.N deficits, Normal affect [place].AT,PERRAL Supple Neck,No JVD, No cervical lymphadenopathy appriciated.  Symmetrical Chest wall movement, Good air movement bilaterally, CTAB RRR,No Gallops,Rubs or new Murmurs, No Parasternal Heave +ve B.Sounds, Abd Soft, No tenderness, No organomegaly appriciated, No rebound - guarding or rigidity. No Cyanosis, Clubbing or edema, No new Rash or bruise  Swelling in the right arm and right thigh, some tenderness, no warmth, no signs of compartment syndrome    Data Review:   Micro Results No results found for this or any previous visit (from the past 240 hour(s)).  Radiology Reports Dg Elbow Complete Right  08/05/2014   CLINICAL DATA:  Right elbow pain that radiates to the shoulder. Pain started after picking up horse feed.  EXAM: RIGHT ELBOW - COMPLETE 3+ VIEW  COMPARISON:  None.  FINDINGS: There is no evidence of fracture, dislocation, or joint effusion. There is no evidence of arthropathy or other focal bone abnormality. Soft tissues are unremarkable.  IMPRESSION: Negative.   Electronically Signed   By: Markus Daft M.D.   On: 08/05/2014 08:05   Mr Humerus Right Wo Contrast  08/10/2014   CLINICAL DATA:  Right arm pain after an injury throwing a bag of horse feed 2 weeks ago.  EXAM: MRI OF THE RIGHT HUMERUS WITHOUT CONTRAST  TECHNIQUE:  Multiplanar, multisequence MR imaging was performed. No intravenous contrast was administered.  COMPARISON:  MRI dated 08/08/2014 and radiograph dated 05/05/2014  FINDINGS: The edema in the right triceps muscle is even more extensive than on the prior study. The muscle is larger than on the prior study. There is only slight sparing of the proximal aspect of the muscle. Subcutaneous edema is more prominent. The underlying humerus and the neurovascular bundle are  normal. The focal area of abnormal signal at the distal mild tendinous junction is unchanged.  IMPRESSION: Progressive edema of the triceps muscle consistent with myositis. Focal area of abnormal signal at the mild tendinous junction could represent a tear or small abscess but this is not progressed. Given the patient's elevated white blood count, infectious myositis must be considered.   Electronically Signed   By: Lorriane Shire M.D.   On: 08/10/2014 08:50   Mr Humerus Right Wo Contrast  08/09/2014   CLINICAL DATA:  Pain throughout entire right humerus for 2 weeks.  EXAM: MRI OF THE RIGHT HUMERUS WITHOUT CONTRAST  TECHNIQUE: Multiplanar, multisequence MR imaging was performed. No intravenous contrast was administered.  COMPARISON:  None.  FINDINGS: No marrow signal abnormality. No fracture or dislocation. There is no bone destruction or periosteal reaction.  There is severe edema within the right triceps muscle with small areas of sparing proximally. At the distal musculotendinous junction of the medial head of the triceps, there is a complex 2 x 2 x 2.3 cm T2 signal abnormality with central T2 hyperintensity and peripheral intermediate signal. There is mild edema in the adjacent subcutaneous fat.  The biceps musculature is normal. There is no other fluid collection or soft tissue mass. The neurovascular bundles are grossly normal. There are multiple prominent right axillary lymph nodes.  IMPRESSION: 1. Severe edema within the right triceps muscle with  small areas of sparing proximally. At the distal musculotendinous junction of the medial head of the triceps, there is a complex 2 x 2 x 2.3 cm T2 signal abnormality. The overall appearance is concerning for myositis which may be secondary to an infectious or inflammatory etiology. The 2 cm area of focal abnormal signal which may reflect a hematoma secondary to a partial tear given the location is at the musculotendinous junction of the medial head of the triceps, but given the extensiveness of the muscle edema, this may reflect a developing abscess versus myonecrosis.   Electronically Signed   By: Kathreen Devoid   On: 08/09/2014 08:31   Ct Femur Right Wo Contrast  08/10/2014   CLINICAL DATA:  Myositis. Sudden onset of pain while lifting a feed bag 2 weeks ago.  EXAM: CT OF THE LOWER RIGHT EXTREMITY WITHOUT CONTRAST  TECHNIQUE: Multidetector CT imaging of the right femur and right tibia fibula was performed according to the standard protocol.  COMPARISON:  None.  FINDINGS: There is no bony abnormality. There is no radiopaque foreign body. There is no soft tissue mass. There is no abscess or drainable fluid collection. There is no soft tissue gas.  There is soft tissue edema around the muscles of the right lower extremity, particularly around the adductor of the via and the quadriceps musculature. This is consistent with the clinically described suspicion of myositis. There is a small knee joint effusion.  IMPRESSION: Soft tissue edema surrounding the musculature of the right lower extremity consistent with myositis. No mass or drainable fluid collection.   Electronically Signed   By: Andreas Newport M.D.   On: 08/10/2014 03:49   Ct Tibia Fibula Right Wo Contrast  08/10/2014   CLINICAL DATA:  Myositis. Sudden onset of pain while lifting a feed bag 2 weeks ago.  EXAM: CT OF THE LOWER RIGHT EXTREMITY WITHOUT CONTRAST  TECHNIQUE: Multidetector CT imaging of the right femur and right tibia fibula was performed  according to the standard protocol.  COMPARISON:  None.  FINDINGS: There is no bony abnormality. There is no  radiopaque foreign body. There is no soft tissue mass. There is no abscess or drainable fluid collection. There is no soft tissue gas.  There is soft tissue edema around the muscles of the right lower extremity, particularly around the adductor of the via and the quadriceps musculature. This is consistent with the clinically described suspicion of myositis. There is a small knee joint effusion.  IMPRESSION: Soft tissue edema surrounding the musculature of the right lower extremity consistent with myositis. No mass or drainable fluid collection.   Electronically Signed   By: Andreas Newport M.D.   On: 08/10/2014 03:49   Dg Femur, Min 2 Views Right  08/10/2014   CLINICAL DATA:  Right thigh pain. Concern for myositis. Rule out soft tissue air.  EXAM: RIGHT FEMUR 2 VIEWS  COMPARISON:  None.  FINDINGS: Cortical margins of the right femur are intact. No fracture, focal osseous lesion, erosion or periosteal reaction. No soft tissue air or radiopaque foreign body. Questionable soft tissue edema proximally.  IMPRESSION: Suspect mild soft tissue edema proximally. No soft tissue air, radiopaque foreign body, or acute osseous abnormality.   Electronically Signed   By: Jeb Levering M.D.   On: 08/10/2014 01:48     CBC  Recent Labs Lab 08/09/14 1819 08/10/14 0128  WBC 22.6* 18.7*  HGB 9.5* 7.8*  HCT 28.5* 23.4*  PLT 308 242  MCV 82.6 83.0  MCH 27.5 27.7  MCHC 33.3 33.3  RDW 13.6 13.6  LYMPHSABS 1.5  --   MONOABS 1.5*  --   EOSABS 0.0  --   BASOSABS 0.0  --     Chemistries   Recent Labs Lab 08/09/14 1819 08/09/14 2350 08/10/14 0128  NA 136  --  135  K 4.5  --  4.0  CL 105  --  103  CO2 22  --  21*  GLUCOSE 68  --  101*  BUN 41*  --  39*  CREATININE 5.46*  --  5.33*  CALCIUM 9.0  --  8.2*  MG  --  2.0  --   AST 28  --  38  ALT 15  --  18  ALKPHOS 127*  --  166*  BILITOT 0.5   --  0.6   ------------------------------------------------------------------------------------------------------------------ estimated creatinine clearance is 14.3 mL/min (by C-G formula based on Cr of 5.33). ------------------------------------------------------------------------------------------------------------------ No results for input(s): HGBA1C in the last 72 hours. ------------------------------------------------------------------------------------------------------------------ No results for input(s): CHOL, HDL, LDLCALC, TRIG, CHOLHDL, LDLDIRECT in the last 72 hours. ------------------------------------------------------------------------------------------------------------------  Recent Labs  08/10/14 0128  TSH 25.942*   ------------------------------------------------------------------------------------------------------------------ No results for input(s): VITAMINB12, FOLATE, FERRITIN, TIBC, IRON, RETICCTPCT in the last 72 hours.  Coagulation profile  Recent Labs Lab 08/09/14 2350  INR 1.31    No results for input(s): DDIMER in the last 72 hours.  Cardiac Enzymes No results for input(s): CKMB, TROPONINI, MYOGLOBIN in the last 168 hours.  Invalid input(s): CK ------------------------------------------------------------------------------------------------------------------ Invalid input(s): POCBNP   Time Spent in minutes  35   SINGH,PRASHANT K M.D on 08/10/2014 at 11:04 AM  Between 7am to 7pm - Pager - [phone]  After 7pm go to [email or web address] - password [facility]  [name]   Office  [phone]

## 2014-08-10 NOTE — Consult Note (Signed)
Reason for Consult: Right arm pain Referring Physician: Hospitalist(Xui)  Tracy [name] is an 29 y.o. female.  HPI: Patient is a 29 year old female who was throwing a horse feed bag 2 weeks ago and felt a pop/strain in her right upper arm near the elbow and experienced pain in her right thigh she notes. The patient states that she continue to perform her job activities. She enjoys horses and also works at [employer]. She states she continued to have pain which ultimately prompted an emergency room visit 5 days ago at [facility] She was seen x-rays were taken and it was felt she had a strain in her triceps. She was referred to Dr. Aline Brochure. She states Dr. Aline Brochure performed an MRI yesterday. Dr. Aline Brochure called her today and told her to see her regular physician to be admitted to the hospital. Her regular physician in [place] declined the admission. She subsequently came to [facility] to be evaluated and treated. The patient states that her arm is actually less swollen than it was over the weekend. It still continues to have significant pain. She notes no prior history of injury to her arm or pain in this region. She states that her right thigh continues to have problems and pain. She denies history of DVT. She states she's been off birth control medicines since March roughly. She notes no obvious puncture or breaking her skin to suggest a entry portal. She denies any recent unusual scratches abrasions or other issues.  She states her right thigh and right upper arm are very painful. She states that her left side is completely normal. She states is been difficult for her to hold down food. She's been admitted by the hospital service. I've been asked to see her in regards to her right arm. She states she has normal sensation. Once again she does state that the swelling is decreased. She denies any prior injury other then the event mentioned above  She does not smoke or drink. She is allergic  to penicillin which causes soft tissue swelling and is allergic to sulfa.  She has a history of a detached retina pilonidal cyst and cholecystectomy in terms of surgery. She also has a history of hand surgery in the [facility] by Dr. Vanita Panda.    Past Medical History  Diagnosis Date  . Thyroid enlargement     "not on medication at this time"  . Urinary tract infection     hx of  . Anemia     presently on iron supplement  . Diabetes mellitus     insulin pump   . Hypothyroidism   . Anxiety   . HSV-2 (herpes simplex virus 2) infection   . HSV-1 (herpes simplex virus 1) infection   . Detached retina   . Yeast infection     took diflucan saturday   . Hypertension   . Irregular periods 07/05/2014  . Vaginal discharge 07/05/2014    Past Surgical History  Procedure Laterality Date  . Cholecystectomy    . Pilonidal cyst excision    . Wisdom tooth extraction    . Pars plana vitrectomy  10/01/2011    Procedure: PARS PLANA VITRECTOMY WITH 25 GAUGE;  Surgeon: Hayden Pedro, MD;  Location: [place];  Service: Ophthalmology;  Laterality: Right;  Repair Complex Traction Retinal Detachment  . Trigger finger release Right 09/21/13    Family History  Problem Relation Age of Onset  . Cancer Paternal Grandfather     prostate  .  Hyperlipidemia Paternal Grandfather   . Stroke Paternal Grandfather     Social History:  reports that she has never smoked. She has never used smokeless tobacco. She reports that she does not drink alcohol or use illicit drugs.  Allergies:  Allergies  Allergen Reactions  . Penicillins Hives and Swelling  . Sulfa Antibiotics Hives    Medications: I have reviewed the patient's current medications.  Results for orders placed or performed during the hospital encounter of 08/09/14 (from the past 48 hour(s))  Urinalysis, Routine w reflex microscopic (not at [facility])     Status: Abnormal   Collection Time: 08/09/14  1:57 PM  Result Value Ref Range   Color, Urine YELLOW  YELLOW   APPearance CLOUDY (A) CLEAR   Specific Gravity, Urine 1.018 1.005 - 1.030   pH 5.0 5.0 - 8.0   Glucose, UA 100 (A) NEGATIVE mg/dL   Hgb urine dipstick MODERATE (A) NEGATIVE   Bilirubin Urine NEGATIVE NEGATIVE   Ketones, ur NEGATIVE NEGATIVE mg/dL   Protein, ur >300 (A) NEGATIVE mg/dL   Urobilinogen, UA 0.2 0.0 - 1.0 mg/dL   Nitrite NEGATIVE NEGATIVE   Leukocytes, UA NEGATIVE NEGATIVE  Urine microscopic-add on     Status: Abnormal   Collection Time: 08/09/14  1:57 PM  Result Value Ref Range   Squamous Epithelial / LPF MANY (A) RARE   WBC, UA 0-2 <3 WBC/hpf   RBC / HPF 0-2 <3 RBC/hpf   Bacteria, UA RARE RARE   Casts GRANULAR CAST (A) NEGATIVE   Urine-Other AMORPHOUS URATES/PHOSPHATES   Comprehensive metabolic panel     Status: Abnormal   Collection Time: 08/09/14  6:19 PM  Result Value Ref Range   Sodium 136 135 - 145 mmol/L   Potassium 4.5 3.5 - 5.1 mmol/L   Chloride 105 101 - 111 mmol/L   CO2 22 22 - 32 mmol/L   Glucose, Bld 68 65 - 99 mg/dL   BUN 41 (H) 6 - 20 mg/dL   Creatinine, Ser 5.46 (H) 0.44 - 1.00 mg/dL   Calcium 9.0 8.9 - 10.3 mg/dL   Total Protein 7.6 6.5 - 8.1 g/dL   Albumin 2.9 (L) 3.5 - 5.0 g/dL   AST 28 15 - 41 U/L   ALT 15 14 - 54 U/L   Alkaline Phosphatase 127 (H) 38 - 126 U/L   Total Bilirubin 0.5 0.3 - 1.2 mg/dL   GFR calc non Af Amer 10 (L) >60 mL/min   GFR calc Af Amer 11 (L) >60 mL/min    Comment: (NOTE) The eGFR has been calculated using the CKD EPI equation. This calculation has not been validated in all clinical situations. eGFR's persistently <60 mL/min signify possible Chronic Kidney Disease.    Anion gap 9 5 - 15  CBC with Differential     Status: Abnormal   Collection Time: 08/09/14  6:19 PM  Result Value Ref Range   WBC 22.6 (H) 4.0 - 10.5 K/uL   RBC 3.45 (L) 3.87 - 5.11 MIL/uL   Hemoglobin 9.5 (L) 12.0 - 15.0 g/dL   HCT 28.5 (L) 36.0 - 46.0 %   MCV 82.6 78.0 - 100.0 fL   MCH 27.5 26.0 - 34.0 pg   MCHC 33.3 30.0 - 36.0 g/dL    RDW 13.6 11.5 - 15.5 %   Platelets 308 150 - 400 K/uL   Neutrophils Relative % 86 (H) 43 - 77 %   Neutro Abs 19.5 (H) 1.7 - 7.7 K/uL   Lymphocytes Relative  7 (L) 12 - 46 %   Lymphs Abs 1.5 0.7 - 4.0 K/uL   Monocytes Relative 7 3 - 12 %   Monocytes Absolute 1.5 (H) 0.1 - 1.0 K/uL   Eosinophils Relative 0 0 - 5 %   Eosinophils Absolute 0.0 0.0 - 0.7 K/uL   Basophils Relative 0 0 - 1 %   Basophils Absolute 0.0 0.0 - 0.1 K/uL  I-Stat CG4 Lactic Acid, ED (Not at [facility] or [facility])     Status: None   Collection Time: 08/09/14  7:07 PM  Result Value Ref Range   Lactic Acid, Venous 1.35 0.5 - 2.0 mmol/L  CK     Status: Abnormal   Collection Time: 08/09/14  8:33 PM  Result Value Ref Range   Total CK 257 (H) 38 - 234 U/L  I-Stat CG4 Lactic Acid, ED (Not at [facility] or [facility])     Status: None   Collection Time: 08/09/14  9:54 PM  Result Value Ref Range   Lactic Acid, Venous 0.59 0.5 - 2.0 mmol/L  Glucose, capillary     Status: None   Collection Time: 08/09/14 11:20 PM  Result Value Ref Range   Glucose-Capillary 93 65 - 99 mg/dL    Mr Humerus Right Wo Contrast  08/09/2014   CLINICAL DATA:  Pain throughout entire right humerus for 2 weeks.  EXAM: MRI OF THE RIGHT HUMERUS WITHOUT CONTRAST  TECHNIQUE: Multiplanar, multisequence MR imaging was performed. No intravenous contrast was administered.  COMPARISON:  None.  FINDINGS: No marrow signal abnormality. No fracture or dislocation. There is no bone destruction or periosteal reaction.  There is severe edema within the right triceps muscle with small areas of sparing proximally. At the distal musculotendinous junction of the medial head of the triceps, there is a complex 2 x 2 x 2.3 cm T2 signal abnormality with central T2 hyperintensity and peripheral intermediate signal. There is mild edema in the adjacent subcutaneous fat.  The biceps musculature is normal. There is no other fluid collection or soft tissue mass. The neurovascular bundles are grossly normal.  There are multiple prominent right axillary lymph nodes.  IMPRESSION: 1. Severe edema within the right triceps muscle with small areas of sparing proximally. At the distal musculotendinous junction of the medial head of the triceps, there is a complex 2 x 2 x 2.3 cm T2 signal abnormality. The overall appearance is concerning for myositis which may be secondary to an infectious or inflammatory etiology. The 2 cm area of focal abnormal signal which may reflect a hematoma secondary to a partial tear given the location is at the musculotendinous junction of the medial head of the triceps, but given the extensiveness of the muscle edema, this may reflect a developing abscess versus myonecrosis.   Electronically Signed   By: Kathreen Devoid   On: 08/09/2014 08:31    Review of Systems  Constitutional: Negative.   Eyes: Negative.   Respiratory: Negative.   Gastrointestinal:       Loss of appetite  Genitourinary: Negative.   Musculoskeletal:       Please see H&P and physical exam  Neurological: Negative.   Endo/Heme/Allergies: Negative.   Psychiatric/Behavioral: Negative.    Blood pressure 173/82, pulse 94, temperature 98.7 F (37.1 C), temperature source Oral, resp. rate 16, weight 68.04 kg (150 lb), last menstrual period 07/18/2014, SpO2 98 %. Physical Exam patient has a stable HEENT exam. Shoulder and clavicle examination is benign. She has normal chest wall expansion and clear lung  fields it appears. Abdomen is nontender. Pelvis is stable. Her left arm and left leg are nontender. She has IV access in her left arm.    Her right arm is examined extensively. She has intact sensation and motor function to the hand and wrist. She states her swelling is decreased as mentioned above. She is nontender on passive extension and flexion as well as active extension and flexion of the hand. Wrist moves nicely. Soft tissue compartments are soft about the anterior and posterior compartments of the forearm. The  anterior biceps compartment is soft in the upper arm. The posterior compartment is not overly tense but is very tender. This is where her muscle pain originated and continues to be problematic. There is no crepitance. She does not have ascending pain and once again states that the swelling is actually improved. I do not detect any advance erythema or cellulitic change.She has a normal pulse. She has intact sensation throughout his mentioned. I do not see an obvious necrotizing fasciitis at this juncture but certainly have examined her very carefully.  Her lower extremity shows some pain in the abductor area of her right upper thigh. Knee and ankle are stable. She can perform a straight leg raise. There is no obvious compartment syndrome but certainly she exhibits some pain in this area as this is been present for 2 weeks. Compared to the left thigh she certainly has some degree of swelling and pain which is notable.  Assessment/Plan: Two-week history of pain in the upper arm with continued symptoms and an MRI suggestive of myositis. Given her white blood cell count and history of diabetes I feel that antibiotics are quite appropriate. I discussed with her admitting physician I would recommend an ultrasound to rule out DVT and a follow-up MRI tomorrow to see if the very small area at the myotendinous junction appears to worsen. I do not see any soft tissue air nor See a obvious abscess to go after surgically at this juncture. However if she does not improve one could certainly consider a irrigation debridement and incision to inspect the muscle and rule out an infection in evolution. Is very conceivable this could represent a tear which developed a small hematoma and is gone on to tremendous inflammation. This would certainly fit with her presentation. Oftentimes these processes can be in evolution and certainly if she worsens one would have a quick trigger to perform a surgical look. I discussed this with  radiology on the phone tonight and with her admitting physician. Given her lack of erythema and lack of signs of necrotizing fasciitis I would see [place] she does overnight to make a decision tomorrow pending her progress. I feel it ultrasound and follow-up MRI would be very appropriate.  I would also recommend consultation for leg via general surgery / orthopedics  Certainly this is a complex case and I discussed this with her arm other and the patient at great length at her bedside. Aspect grade and 60 minutes face-to-face time with him tonight. We will keep a very close eye on her. At present time the area of myositis seems to be contained in her arm but certainly has not made any improvements over the last 5 days in terms of pain although the swelling has improve she notes. Lastacia Solum III,Melah Ebling M 08/10/2014, 12:01 AM

## 2014-08-10 NOTE — Evaluation (Signed)
Physical Therapy Evaluation Patient Details Name: Tracy [name] MRN: [id number] DOB: December 17, 1985 Today's Date: 08/10/2014   History of Present Illness  Myositis of the right arm and right thigh. Most likely post physical strain 2 weeks ago while throwing horse feed.  Past Medical History  Diagnosis Date  . Thyroid enlargement     "not on medication at this time"  . Urinary tract infection     hx of  . Anemia     presently on iron supplement  . Diabetes mellitus     insulin pump   . Hypothyroidism   . Anxiety   . HSV-2 (herpes simplex virus 2) infection   . HSV-1 (herpes simplex virus 1) infection   . Detached retina   . Yeast infection     took diflucan saturday   . Hypertension   . Irregular periods 07/05/2014  . Vaginal discharge 07/05/2014   Past Surgical History  Procedure Laterality Date  . Cholecystectomy    . Pilonidal cyst excision    . Wisdom tooth extraction    . Pars plana vitrectomy  10/01/2011    Procedure: PARS PLANA VITRECTOMY WITH 25 GAUGE;  Surgeon: Hayden Pedro, MD;  Location: [place];  Service: Ophthalmology;  Laterality: Right;  Repair Complex Traction Retinal Detachment  . Trigger finger release Right 09/21/13     Clinical Impression  Pt admitted with above diagnosis. Pt currently with functional limitations due to the deficits listed below (see PT Problem List).  Pt will benefit from skilled PT to increase their independence and safety with mobility to allow discharge to the venue listed below.       Follow Up Recommendations Outpatient PT  The potential need for Outpatient PT can be addressed at Ortho follow-up appointments.     Equipment Recommendations  None recommended by PT    Recommendations for Other Services       Precautions / Restrictions Precautions Precautions: None      Mobility  Bed Mobility Overal bed mobility: Needs Assistance Bed Mobility: Supine to Sit     Supine to sit: Min assist     General bed mobility  comments: Cues for technqiue; Noted some success with using LLE under RLE to help support RLE off of the bed; min assist to square off hips at EOB  Transfers Overall transfer level: Needs assistance Equipment used: None Transfers: Sit to/from Stand Sit to Stand: Supervision         General transfer comment: Painful to move at all, but good rise on LLE  Ambulation/Gait Ambulation/Gait assistance: Supervision Ambulation Distance (Feet): 20 Feet (to/from bathroom) Assistive device:  (pushing IV pole) Gait Pattern/deviations: Step-to pattern;Trunk flexed;Decreased stance time - right Gait velocity: quite slow   General Gait Details: Very slow and painful, with limited stance time RLE; tending to reach out for UE support, so pushed IV pole  Stairs            Wheelchair Mobility    Modified Rankin (Stroke Patients Only)       Balance                                             Pertinent Vitals/Pain Pain Assessment: 0-10 Pain Score: 8  Pain Location: R inner thigh and R upper arm Pain Descriptors / Indicators: Aching;Grimacing;Guarding Pain Intervention(s): Limited activity within patient's tolerance;Monitored during session;Repositioned;Patient requesting pain  meds-RN notified    Home Living Family/patient expects to be discharged to:: Private residence Living Arrangements: Parent Available Help at Discharge: Family;Available PRN/intermittently Type of Home: House Home Access: Stairs to enter Entrance Stairs-Rails: Psychiatric nurse of Steps: 4 Home Layout: Two level (if she goes to apartment) Home Equipment: None      Prior Function Level of Independence: Independent         Comments: 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 [employer] Hand: Right    Extremity/Trunk Assessment   Upper Extremity Assessment: Defer to OT evaluation (Painful RUE)           Lower Extremity  Assessment: RLE deficits/detail RLE Deficits / Details: Pain in inner thigh with active hip motion    Cervical / Trunk Assessment: Normal  Communication   Communication: No difficulties  Cognition Arousal/Alertness: Awake/alert Behavior During Therapy: WFL for tasks assessed/performed Overall Cognitive Status: Within Functional Limits for tasks assessed                      General Comments General comments (skin integrity, edema, etc.): Nauseated at end of session    Exercises        Assessment/Plan    PT Assessment Patient needs continued PT services  PT Diagnosis Difficulty walking;Acute pain   PT Problem List Decreased strength;Decreased range of motion;Decreased activity tolerance;Decreased balance;Decreased mobility;Decreased coordination;Decreased knowledge of precautions;Pain  PT Treatment Interventions DME instruction;Gait training;Stair training;Functional mobility training;Therapeutic activities;Therapeutic exercise;Patient/family education   PT Goals (Current goals can be found in the Care Plan section) Acute Rehab PT Goals Patient Stated Goal: wants to feel better PT Goal Formulation: With patient Time For Goal Achievement: 08/24/14 Potential to Achieve Goals: Good    Frequency Min 3X/week   Barriers to discharge   flight to get in friend's apartment; few steps to get into mother's home    Co-evaluation               End of Session   Activity Tolerance: Patient tolerated treatment well;Patient limited by pain (limited by nausea) Patient left: in chair;with call bell/phone within reach;with family/visitor present Nurse Communication: Mobility status         Time: [id number] PT Time Calculation (min) (ACUTE ONLY): 34 min   Charges:   PT Evaluation $Initial PT Evaluation Tier I: 1 Procedure PT Treatments $Gait Training: 8-22 mins   PT G Codes:        Quin Hoop 08/10/2014, 3:39 PM  Roney Marion, [place] Pager [phone] Office [phone]

## 2014-08-10 NOTE — Progress Notes (Signed)
VASCULAR LAB PRELIMINARY  PRELIMINARY  PRELIMINARY  PRELIMINARY  Right upper extremity venous duplex completed.    Preliminary report:  Right :  No evidence of DVT or superficial thrombosis.    Johnross Nabozny, RVS 08/10/2014, 10:54 AM

## 2014-08-10 NOTE — Progress Notes (Signed)
OT Cancellation Note  Patient Details Name: Tracy [name] MRN: [id number] DOB: 1986-02-04   Cancelled Treatment:    Reason Eval/Treat Not Completed: Fatigue/lethargy/pain and nausea limiting ability to participate. Attempted to see pt following PT, unable to tolerate further activity. Will continue to follow.  Malka So 08/10/2014, 3:41 PM  [phone]

## 2014-08-10 NOTE — Progress Notes (Signed)
Subjective: Patient seen and examined at bedside.  Her mother is with her.  She states her pain is about the same.  She notes no locking popping catching. She notes no obvious worsening. I discussed her care with her admitting physician (hospitalist Dr. Candiss Norse). At present time her MRIs not particularly better or worse. She still has myositis which is present. General surgery signed off. The patient is examined in detail bedside.  I reviewed her MRI extensively as well as her laboratory analysis. We're awaiting her hemoglobin A1c. Her sedimentation rate and C-reactive protein certainly are reflective of a inflammatory versus infectious process.  Objective: Vital signs in last 24 hours: Temp:  [98.4 F (36.9 C)-99.1 F (37.3 C)] 98.4 F (36.9 C) (06/29 0912) Pulse Rate:  [89-115] 96 (06/29 0912) Resp:  [10-20] 20 (06/29 0912) BP: (156-204)/(75-103) 168/80 mmHg (06/29 0912) SpO2:  [97 %-100 %] 100 % (06/29 0912) Weight:  [68.04 kg (150 lb)] 68.04 kg (150 lb) (06/28 2301)  Intake/Output from previous day: 06/28 0701 - 06/29 0700 In: 2299.2 [P.O.:270; I.V.:29.2; IV Piggyback:2000] Out: -  Intake/Output this shift:     Recent Labs  08/09/14 1819 08/10/14 0128  HGB 9.5* 7.8*    Recent Labs  08/09/14 1819 08/10/14 0128 08/10/14 1220  WBC 22.6* 18.7*  --   RBC 3.45* 2.82* 2.92*  HCT 28.5* 23.4*  --   PLT 308 242  --     Recent Labs  08/09/14 1819 08/10/14 0128  NA 136 135  K 4.5 4.0  CL 105 103  CO2 22 21*  BUN 41* 39*  CREATININE 5.46* 5.33*  GLUCOSE 68 101*  CALCIUM 9.0 8.2*    Recent Labs  08/09/14 2350  INR 1.31   physical exam: Patient's arm is without significant change. Her elbow is nontender to pronation supination but with flexion and extension is highly tender as is he when putting the triceps on stretch. Her biceps is nontender for the most part. The patient has intact wrist flexion and extension finger flexion and extension. It appears the  myositis is certainly confined to the triceps region. The patient has normal radial median and ulnar nerve function. There is no ascending cellulitis lymphangitis or pain in the shoulder girdle or neck region. Her axillary region is without obvious lymphadenopathy. I have palpated this extensively. She has no evidence of compartment syndrome and is completely nontender on passive motion to the wrist forearm and hand. The main area of tenderness over the triceps when is placed on stretch of course.  I reviewed this with her at length and the findings.  All pulses in the upper extremity are normal.  Assessment/Plan: Myositis with continued pain and no significant change on MRI over 48 hour timeframe. Given her situation I would certainly consider an open biopsy and exploration of the muscle. I have discussed her these issues at length. They're hoping to avoid any surgical intervention but certainly one has to consider this in the face of ongoing problems. We'll get a fresh look at her laboratory analysis tomorrow and see which way this is trending as well as take a good clinical exam. I discussed this with the hospitalist. My inclination is to consider a biopsy if she does not have any improvement to rule out any infectious myositis. As it is not dramatically worsening this may simply be a myositis noninfectious but certainly she gives cause for concern given what I would deem as a slightly immunocompromised state with a low blood count, diabetes  etc. However she is young active and does not fit the perfect pattern of an immunocompromised state at present time. Hopefully her hemoglobin A1c will give [place] some direction as well. This could represent a hematoma with hematogenous bacterial seeding or simply a hematoma and subsequent muscle edema. Her antibiotics are very appropriate at this time. Elevation and cautious observation is the rule General orthopedics will review her leg it appears. I discussed the  patient all issues at great length.     Paulene Floor 08/10/2014, 5:39 PM

## 2014-08-10 NOTE — Progress Notes (Signed)
Utilization review completed. Shamina Etheridge, RN, BSN. 

## 2014-08-11 LAB — URINALYSIS, ROUTINE W REFLEX MICROSCOPIC
GLUCOSE, UA: 250 mg/dL — AB
KETONES UR: 15 mg/dL — AB
Leukocytes, UA: NEGATIVE
NITRITE: NEGATIVE
Protein, ur: >300 mg/dL — AB
Specific Gravity, Urine: 1.017 (ref 1.005–1.030)
UROBILINOGEN UA: 0.2 mg/dL (ref 0.0–1.0)
pH: 5 (ref 5.0–8.0)

## 2014-08-11 LAB — BASIC METABOLIC PANEL
Anion gap: 10 (ref 5–15)
BUN: 42 mg/dL — ABNORMAL HIGH (ref 6–20)
CALCIUM: 8.5 mg/dL — AB (ref 8.9–10.3)
CHLORIDE: 105 mmol/L (ref 101–111)
CO2: 17 mmol/L — ABNORMAL LOW (ref 22–32)
Creatinine, Ser: 5.39 mg/dL — ABNORMAL HIGH (ref 0.44–1.00)
GFR calc Af Amer: 11 mL/min — ABNORMAL LOW (ref >60)
GFR, EST NON AFRICAN AMERICAN: 10 mL/min — AB (ref >60)
GLUCOSE: 176 mg/dL — AB (ref 65–99)
Potassium: 4.7 mmol/L (ref 3.5–5.1)
Sodium: 132 mmol/L — ABNORMAL LOW (ref 135–145)

## 2014-08-11 LAB — GLUCOSE, CAPILLARY
GLUCOSE-CAPILLARY: 188 mg/dL — AB (ref 65–99)
GLUCOSE-CAPILLARY: 160 mg/dL — AB (ref 65–99)
Glucose-Capillary: 177 mg/dL — ABNORMAL HIGH (ref 65–99)
Glucose-Capillary: 134 mg/dL — ABNORMAL HIGH (ref 65–99)

## 2014-08-11 LAB — CBC WITH DIFFERENTIAL/PLATELET
BASOS ABS: 0.1 10*3/uL (ref 0.0–0.1)
Basophils Relative: 0 % (ref 0–1)
EOS ABS: 0.1 10*3/uL (ref 0.0–0.7)
EOS PCT: 1 % (ref 0–5)
HCT: 23 % — ABNORMAL LOW (ref 36.0–46.0)
Hemoglobin: 7.4 g/dL — ABNORMAL LOW (ref 12.0–15.0)
LYMPHS ABS: 1 10*3/uL (ref 0.7–4.0)
Lymphocytes Relative: 6 % — ABNORMAL LOW (ref 12–46)
MCH: 27.1 pg (ref 26.0–34.0)
MCHC: 32.2 g/dL (ref 30.0–36.0)
MCV: 84.2 fL (ref 78.0–100.0)
MONO ABS: 1.5 10*3/uL — AB (ref 0.1–1.0)
MONOS PCT: 9 % (ref 3–12)
Neutro Abs: 14.2 10*3/uL — ABNORMAL HIGH (ref 1.7–7.7)
Neutrophils Relative %: 84 % — ABNORMAL HIGH (ref 43–77)
Platelets: 250 10*3/uL (ref 150–400)
RBC: 2.73 MIL/uL — ABNORMAL LOW (ref 3.87–5.11)
RDW: 14 % (ref 11.5–15.5)
WBC: 16.9 10*3/uL — ABNORMAL HIGH (ref 4.0–10.5)

## 2014-08-11 LAB — URINE MICROSCOPIC-ADD ON

## 2014-08-11 LAB — ALDOLASE: ALDOLASE: 11.1 U/L — AB (ref 3.3–10.3)

## 2014-08-11 LAB — SODIUM, URINE, RANDOM: Sodium, Ur: 23 mmol/L

## 2014-08-11 LAB — T3, FREE: T3 FREE: 1.7 pg/mL — AB (ref 2.0–4.4)

## 2014-08-11 LAB — URINE CULTURE

## 2014-08-11 LAB — CREATININE, URINE, RANDOM: Creatinine, Urine: 118.84 mg/dL

## 2014-08-11 LAB — ANTINUCLEAR ANTIBODIES, IFA: ANTINUCLEAR ANTIBODIES, IFA: NEGATIVE

## 2014-08-11 LAB — UREA NITROGEN, URINE: Urea Nitrogen, Ur: 477 mg/dL

## 2014-08-11 LAB — HEMOGLOBIN A1C
Hgb A1c MFr Bld: 8.9 % — ABNORMAL HIGH (ref 4.8–5.6)
MEAN PLASMA GLUCOSE: 209 mg/dL

## 2014-08-11 MED ORDER — SODIUM CHLORIDE 0.9 % IV SOLN
25.0000 mg | Freq: Once | INTRAVENOUS | Status: AC
  Administered 2014-08-11: 25 mg via INTRAVENOUS
  Filled 2014-08-11: qty 0.5

## 2014-08-11 MED ORDER — SODIUM CHLORIDE 0.9 % IV SOLN
INTRAVENOUS | Status: DC
  Administered 2014-08-11 – 2014-08-12 (×3): via INTRAVENOUS

## 2014-08-11 MED ORDER — SODIUM CHLORIDE 0.9 % IV SOLN
500.0000 mg | Freq: Every day | INTRAVENOUS | Status: AC
  Administered 2014-08-11 – 2014-08-12 (×2): 500 mg via INTRAVENOUS
  Filled 2014-08-11 (×3): qty 10

## 2014-08-11 MED ORDER — SODIUM CHLORIDE 0.9 % IV SOLN: 1000.0000 mg | Freq: Once | INTRAVENOUS | Status: DC

## 2014-08-11 MED ORDER — ZOLPIDEM TARTRATE 5 MG PO TABS
5.0000 mg | ORAL_TABLET | Freq: Every evening | ORAL | Status: DC | PRN
  Administered 2014-08-11 – 2014-08-21 (×3): 5 mg via ORAL
  Filled 2014-08-11 (×3): qty 1

## 2014-08-11 NOTE — Progress Notes (Signed)
Subjective:  Patient reports pain as moderate but improved  Objective:   VITALS:   Filed Vitals:   08/10/14 1753 08/10/14 2056 08/11/14 0545 08/11/14 1747  BP: 145/71 154/76 135/78 145/80  Pulse: 96 93 92 88  Temp: 97.9 F (36.6 C) 99 F (37.2 C) 98.6 F (37 C) 98 F (36.7 C)  TempSrc: Oral Oral Oral Oral  Resp: 20 17 18 18   Height:      Weight:      SpO2: 100% 100% 98% 98%    Physical Exam Right lower extremity there is still no erythema or warmth she still is moderately tender over her abductor musculature. Distally she is neurovascularly intact.  At the right upper extremity exam is relatively unchanged she is neurovascularly intact she has some tenderness in her triceps she has painless small arc motion but does not like large motion in her shoulder elbow.  LABS  Results for orders placed or performed during the hospital encounter of 08/09/14 (from the past 24 hour(s))  Glucose, capillary     Status: Abnormal   Collection Time: 08/10/14  9:38 PM  Result Value Ref Range   Glucose-Capillary 126 (H) 65 - 99 mg/dL  CBC with Differential/Platelet     Status: Abnormal   Collection Time: 08/11/14  5:07 AM  Result Value Ref Range   WBC 16.9 (H) 4.0 - 10.5 K/uL   RBC 2.73 (L) 3.87 - 5.11 MIL/uL   Hemoglobin 7.4 (L) 12.0 - 15.0 g/dL   HCT 23.0 (L) 36.0 - 46.0 %   MCV 84.2 78.0 - 100.0 fL   MCH 27.1 26.0 - 34.0 pg   MCHC 32.2 30.0 - 36.0 g/dL   RDW 14.0 11.5 - 15.5 %   Platelets 250 150 - 400 K/uL   Neutrophils Relative % 84 (H) 43 - 77 %   Neutro Abs 14.2 (H) 1.7 - 7.7 K/uL   Lymphocytes Relative 6 (L) 12 - 46 %   Lymphs Abs 1.0 0.7 - 4.0 K/uL   Monocytes Relative 9 3 - 12 %   Monocytes Absolute 1.5 (H) 0.1 - 1.0 K/uL   Eosinophils Relative 1 0 - 5 %   Eosinophils Absolute 0.1 0.0 - 0.7 K/uL   Basophils Relative 0 0 - 1 %   Basophils Absolute 0.1 0.0 - 0.1 K/uL  Basic metabolic panel     Status: Abnormal   Collection Time: 08/11/14  5:07 AM  Result Value Ref  Range   Sodium 132 (L) 135 - 145 mmol/L   Potassium 4.7 3.5 - 5.1 mmol/L   Chloride 105 101 - 111 mmol/L   CO2 17 (L) 22 - 32 mmol/L   Glucose, Bld 176 (H) 65 - 99 mg/dL   BUN 42 (H) 6 - 20 mg/dL   Creatinine, Ser 5.39 (H) 0.44 - 1.00 mg/dL   Calcium 8.5 (L) 8.9 - 10.3 mg/dL   GFR calc non Af Amer 10 (L) >60 mL/min   GFR calc Af Amer 11 (L) >60 mL/min   Anion gap 10 5 - 15  Glucose, capillary     Status: Abnormal   Collection Time: 08/11/14  7:54 AM  Result Value Ref Range   Glucose-Capillary 188 (H) 65 - 99 mg/dL   Comment 1 Notify RN    Comment 2 Document in Chart   Glucose, capillary     Status: Abnormal   Collection Time: 08/11/14 11:57 AM  Result Value Ref Range   Glucose-Capillary 160 (H) 65 -  99 mg/dL  Urinalysis, Routine w reflex microscopic (not at [facility]     Status: Abnormal   Collection Time: 08/11/14  4:21 PM  Result Value Ref Range   Color, Urine YELLOW YELLOW   APPearance TURBID (A) CLEAR   Specific Gravity, Urine 1.017 1.005 - 1.030   pH 5.0 5.0 - 8.0   Glucose, UA 250 (A) NEGATIVE mg/dL   Hgb urine dipstick SMALL (A) NEGATIVE   Bilirubin Urine SMALL (A) NEGATIVE   Ketones, ur 15 (A) NEGATIVE mg/dL   Protein, ur >300 (A) NEGATIVE mg/dL   Urobilinogen, UA 0.2 0.0 - 1.0 mg/dL   Nitrite NEGATIVE NEGATIVE   Leukocytes, UA NEGATIVE NEGATIVE  Creatinine, urine, random     Status: None   Collection Time: 08/11/14  4:21 PM  Result Value Ref Range   Creatinine, Urine 118.84 mg/dL  Sodium, urine, random     Status: None   Collection Time: 08/11/14  4:21 PM  Result Value Ref Range   Sodium, Ur 23 mmol/L  Urine microscopic-add on     Status: Abnormal   Collection Time: 08/11/14  4:21 PM  Result Value Ref Range   Squamous Epithelial / LPF FEW (A) RARE   WBC, UA 0-2 <3 WBC/hpf   RBC / HPF 3-6 <3 RBC/hpf   Bacteria, UA RARE RARE   Urine-Other AMORPHOUS URATES/PHOSPHATES   Glucose, capillary     Status: Abnormal   Collection Time: 08/11/14  4:27 PM  Result Value  Ref Range   Glucose-Capillary 177 (H) 65 - 99 mg/dL     Assessment/Plan: Principal Problem:   Myositis Active Problems:   Proliferative diabetic retinopathy   Hypothyroidism   Normocytic anemia   Acute renal failure superimposed on stage 4 chronic kidney disease   Seizure   Sepsis   DM I (diabetes mellitus, type I), uncontrolled   Essential hypertension   PLAN: We are long talk about her symptoms. Nothing has declared itself as being an abscess or any fluid collection that I would recommend any surgical intervention tube. Her white count has improved she's been afebrile I would consider and can continue anti-biotic treatment. MRI is pending and we'll review that tomorrow. Again in the absence of a fluid collection I would recommend that we continue to watch this and treat it as we are and mushy becomes less stable or consolidates a fluid collection.   Renette Butters 08/11/2014, 6:37 PM   Edmonia Lynch, MD Cell [phone]

## 2014-08-11 NOTE — Progress Notes (Signed)
PHARMACY CONSULT: IV IRON  Indication: Iron deficiency anemia   Allergies  Allergen Reactions  . Penicillins Hives and Swelling  . Sulfa Antibiotics Hives   Patient Measurements: Height: 5\' 3"  (160 cm) Weight: 150 lb (68.04 kg) IBW/kg (Calculated) : 52.4  Vital Signs: Temp: 98.6 F (37 C) (06/30 0545) Temp Source: Oral (06/30 0545) BP: 135/78 mmHg (06/30 0545) Pulse Rate: 92 (06/30 0545)  Assessment: 29 yo female with myositis of R arm and R thigh. Has chronic iron deficiency anemia, see labs below. Iron deficit calculated to be ~ 1000 mg. Hgb: 7.4 Fe: 9 Tsat 5 Ferritn 127    Plan:  -Fe dextran test dose 25 mg IV x1 -If test dose is tolerated, Fe dextran 500 mg IV daily x 2 days -Monitor for urticaria, erythema, BP and HR    Hughes Better, PharmD, BCPS Clinical Pharmacist Pager: [phone] 08/11/2014 10:08 AM

## 2014-08-11 NOTE — Progress Notes (Signed)
Patient ID: Tracy [name], female   DOB: 02-17-1985, 29 y.o.   MRN: [id number] Patient examined at bedside.  The patient has no new complaints.  Once again her arm is examined at length. She is neurovascularly intact. She is hesitant to flex the elbow beyond 45. She has excellent pronation supination and wrist flexion and extension as well as finger motion. There is no signs of necrotizing fasciitis or advanced cellulitis for that manner. I discussed with the patient all issues once again including her general health as well as her upper extremity predicament. We will continue to recommend elevation slow gentle motion to the elbow and a repeat MRI tomorrow. I discussed with her that I would be very careful in terms of watching her. If in fact there is a evolving fluid collection one would consider surgical irrigation and debridement. However thus far over the last 2 weeks there is not been large abscess to declare itself.  I spent greater than of time discussing these issues with her. Greater than 30 minutes face-to-face time was performed  We'll review her studies tomorrow. Certainly today her laboratory announces shows improvement and she is not spiking high fevers. Progress is noted however slow in nature  Ajanee Buren M.D.

## 2014-08-11 NOTE — Consult Note (Addendum)
Reason for Consult: Acute renal failure on chronic kidney disease stage IV Referring Physician: Lala Lund M.D. [facility])  HPI:  29 year old Caucasian woman with past medical history significant for chronic kidney disease stage IV secondary to underlying hypertension and diabetes (last saw Dr. Marval Regal in January 2015 and was supposed to see him/get labs every 3 months but failed to follow-up; she informs me that she is establishing care at [facility] of nephrology).  She was admitted with pain of her right thigh and arm after throwing horse back 2 weeks prior to presentation-consequently evaluated by orthopedic surgery for non-resolving pain and found to have myositis.  Concern is raised with her lower renal function noted on admission that has improved only slightly with intravenous fluids. She reports poor oral intake for about a week prior to presentation and was taking ibuprofen 800 mg 3-4 times a day as recommended to her by the physicians at [facility] where she originally presented. In addition to this, she also took a few doses of her furosemide when she could not tolerate.   01/25/2014  08/09/2014  08/10/2014  08/11/2014   BUN 28 (H) 41 (H) 39 (H) 42 (H)  Creatinine 2.84 (H) 5.46 (H) 5.33 (H) 5.39 (H)   Renal ultrasound done yesterday did not show any hydronephrosis and shows a suspicious right upper pole kidney lesion unclear whether abscess or new small complex cystic lesion. Urinalysis on admission with greater than 300 protein, many squamous the vessels, moderate hemoglobin and 0-2 wbc's/hpf.   Past Medical History  Diagnosis Date  . Thyroid enlargement     "not on medication at this time"  . Urinary tract infection     hx of  . Anemia     presently on iron supplement  . Diabetes mellitus     insulin pump   . Hypothyroidism   . Anxiety   . HSV-2 (herpes simplex virus 2) infection   . HSV-1 (herpes simplex virus 1) infection   . Detached retina    . Yeast infection     took diflucan saturday   . Hypertension   . Irregular periods 07/05/2014  . Vaginal discharge 07/05/2014    Past Surgical History  Procedure Laterality Date  . Cholecystectomy    . Pilonidal cyst excision    . Wisdom tooth extraction    . Pars plana vitrectomy  10/01/2011    Procedure: PARS PLANA VITRECTOMY WITH 25 GAUGE;  Surgeon: Hayden Pedro, MD;  Location: [place];  Service: Ophthalmology;  Laterality: Right;  Repair Complex Traction Retinal Detachment  . Trigger finger release Right 09/21/13    Family History  Problem Relation Age of Onset  . Cancer Paternal Grandfather     prostate  . Hyperlipidemia Paternal Grandfather   . Stroke Paternal Grandfather     Social History:  reports that she has never smoked. She has never used smokeless tobacco. She reports that she does not drink alcohol or use illicit drugs.  Allergies:  Allergies  Allergen Reactions  . Penicillins Hives and Swelling  . Sulfa Antibiotics Hives    Medications:  Scheduled: . amLODipine  10 mg Oral Daily  . aztreonam  1 g Intravenous Q8H  . heparin  5,000 Units Subcutaneous 3 times per day  . insulin aspart  0-9 Units Subcutaneous TID WC  . insulin glargine  12 Units Subcutaneous Daily  . iron dextran (INFED/DEXFERRUM) infusion  500 mg Intravenous Daily  . levothyroxine  125 mcg Oral QAC  breakfast  . multivitamin with minerals  1 tablet Oral Daily  . sodium chloride  1,000 mL Intravenous Once  . sodium chloride  3 mL Intravenous Q12H  . vancomycin  1,000 mg Intravenous Q48H    BMP Latest Ref Rng 08/11/2014 08/10/2014 08/09/2014  Glucose 65 - 99 mg/dL 176(H) 101(H) 68  BUN 6 - 20 mg/dL 42(H) 39(H) 41(H)  Creatinine 0.44 - 1.00 mg/dL 5.39(H) 5.33(H) 5.46(H)  BUN/Creat Ratio 8 - 20 - - -  Sodium 135 - 145 mmol/L 132(L) 135 136  Potassium 3.5 - 5.1 mmol/L 4.7 4.0 4.5  Chloride 101 - 111 mmol/L 105 103 105  CO2 22 - 32 mmol/L 17(L) 21(L) 22  Calcium 8.9 - 10.3 mg/dL 8.5(L)  8.2(L) 9.0   CBC Latest Ref Rng 08/11/2014 08/10/2014 08/09/2014  WBC 4.0 - 10.5 K/uL 16.9(H) 18.7(H) 22.6(H)  Hemoglobin 12.0 - 15.0 g/dL 7.4(L) 7.8(L) 9.5(L)  Hematocrit 36.0 - 46.0 % 23.0(L) 23.4(L) 28.5(L)  Platelets 150 - 400 K/uL 250 242 308     Mr Humerus Right Wo Contrast  08/10/2014   CLINICAL DATA:  Right arm pain after an injury throwing a bag of horse feed 2 weeks ago.  EXAM: MRI OF THE RIGHT HUMERUS WITHOUT CONTRAST  TECHNIQUE: Multiplanar, multisequence MR imaging was performed. No intravenous contrast was administered.  COMPARISON:  MRI dated 08/08/2014 and radiograph dated 05/05/2014  FINDINGS: The edema in the right triceps muscle is even more extensive than on the prior study. The muscle is larger than on the prior study. There is only slight sparing of the proximal aspect of the muscle. Subcutaneous edema is more prominent. The underlying humerus and the neurovascular bundle are normal. The focal area of abnormal signal at the distal mild tendinous junction is unchanged.  IMPRESSION: Progressive edema of the triceps muscle consistent with myositis. Focal area of abnormal signal at the mild tendinous junction could represent a tear or small abscess but this is not progressed. Given the patient's elevated white blood count, infectious myositis must be considered.   Electronically Signed   By: Lorriane Shire M.D.   On: 08/10/2014 08:50   US Renal  08/10/2014   CLINICAL DATA:  Acute kidney injury.  Elevated creatinine level.  EXAM: RENAL / URINARY TRACT ULTRASOUND COMPLETE  COMPARISON:  CT examination 03/09/2013 and ultrasound 10/04/2008  FINDINGS: Right Kidney:  Length: 10.6 cm. The right kidney parenchyma is echogenic without hydronephrosis. There is a heterogeneous hypoechoic structure along the upper pole measuring up to 1.1 cm. Previously, the patient had an abscess in the right kidney and unclear if this sequela from the abscess or a new small complex lesion.  Left Kidney:  Length:  10.6 cm. Left kidney is not well visualized but appears to be echogenic. Negative for hydronephrosis.  Bladder:  Appears normal for degree of bladder distention.  IMPRESSION: Negative for hydronephrosis.  Increased echogenicity of both kidneys. Findings raise concern for chronic medical renal disease.  There is a 1.1 cm heterogeneous hypoechoic structure in the right kidney upper pole. Unclear if this is the sequelae of previous renal abscess versus new small complex cystic lesion. Evaluation of this area will be difficult due to acute kidney injury and an elevated creatinine level. However, this may be better characterized with a non contrast MRI versus close surveillance with ultrasound.   Electronically Signed   By: Markus Daft M.D.   On: 08/10/2014 11:34   Ct Femur Right Wo Contrast  08/10/2014   CLINICAL DATA:  Myositis. Sudden onset of pain while lifting a feed bag 2 weeks ago.  EXAM: CT OF THE LOWER RIGHT EXTREMITY WITHOUT CONTRAST  TECHNIQUE: Multidetector CT imaging of the right femur and right tibia fibula was performed according to the standard protocol.  COMPARISON:  None.  FINDINGS: There is no bony abnormality. There is no radiopaque foreign body. There is no soft tissue mass. There is no abscess or drainable fluid collection. There is no soft tissue gas.  There is soft tissue edema around the muscles of the right lower extremity, particularly around the adductor of the via and the quadriceps musculature. This is consistent with the clinically described suspicion of myositis. There is a small knee joint effusion.  IMPRESSION: Soft tissue edema surrounding the musculature of the right lower extremity consistent with myositis. No mass or drainable fluid collection.   Electronically Signed   By: Andreas Newport M.D.   On: 08/10/2014 03:49   Ct Tibia Fibula Right Wo Contrast  08/10/2014   CLINICAL DATA:  Myositis. Sudden onset of pain while lifting a feed bag 2 weeks ago.  EXAM: CT OF THE LOWER  RIGHT EXTREMITY WITHOUT CONTRAST  TECHNIQUE: Multidetector CT imaging of the right femur and right tibia fibula was performed according to the standard protocol.  COMPARISON:  None.  FINDINGS: There is no bony abnormality. There is no radiopaque foreign body. There is no soft tissue mass. There is no abscess or drainable fluid collection. There is no soft tissue gas.  There is soft tissue edema around the muscles of the right lower extremity, particularly around the adductor of the via and the quadriceps musculature. This is consistent with the clinically described suspicion of myositis. There is a small knee joint effusion.  IMPRESSION: Soft tissue edema surrounding the musculature of the right lower extremity consistent with myositis. No mass or drainable fluid collection.   Electronically Signed   By: Andreas Newport M.D.   On: 08/10/2014 03:49   Dg Femur, Min 2 Views Right  08/10/2014   CLINICAL DATA:  Right thigh pain. Concern for myositis. Rule out soft tissue air.  EXAM: RIGHT FEMUR 2 VIEWS  COMPARISON:  None.  FINDINGS: Cortical margins of the right femur are intact. No fracture, focal osseous lesion, erosion or periosteal reaction. No soft tissue air or radiopaque foreign body. Questionable soft tissue edema proximally.  IMPRESSION: Suspect mild soft tissue edema proximally. No soft tissue air, radiopaque foreign body, or acute osseous abnormality.   Electronically Signed   By: Jeb Levering M.D.   On: 08/10/2014 01:48    Review of Systems  Constitutional: Positive for malaise/fatigue.  HENT: Negative.   Eyes: Negative.   Respiratory: Negative.   Cardiovascular: Negative.   Gastrointestinal: Positive for nausea. Negative for vomiting and diarrhea.  Musculoskeletal: Positive for myalgias and joint pain.       Prominently of right arm/right thigh  Skin: Negative.   Neurological: Positive for weakness. Negative for dizziness and tingling.  Endo/Heme/Allergies: Negative.    Psychiatric/Behavioral: The patient is nervous/anxious.    Blood pressure 135/78, pulse 92, temperature 98.6 F (37 C), temperature source Oral, resp. rate 18, height 5\' 3"  (1.6 m), weight 68.04 kg (150 lb), last menstrual period 07/18/2014, SpO2 98 %. Physical Exam  Nursing note and vitals reviewed. Constitutional: She appears well-developed and well-nourished.  Appears to be uncomfortable resting in bed  HENT:  Head: Normocephalic and atraumatic.  Nose: Nose normal.  Eyes: EOM are normal. Pupils are equal, round, and reactive  to light. No scleral icterus.  Neck: Normal range of motion. Neck supple. No JVD present.  Cardiovascular: Normal rate, regular rhythm and normal heart sounds.   Respiratory: Effort normal and breath sounds normal. She has no wheezes. She has no rales.  GI: Soft. Bowel sounds are normal. There is no tenderness.  Musculoskeletal: She exhibits no edema.  Very tender right thigh, tender right arm around the elbow  Skin: Skin is warm. There is erythema.  Erythematous/warm to touch right thigh/arm  Psychiatric: She has a normal mood and affect.    Assessment/Plan: 1. Acute renal failure on chronic kidney disease stage IV: Baseline renal function unclear over the past 6 months due to the paucity of lab data (including care everywhere). She definitely has acute renal failure that appears to be likely from evolution of a prerenal status to ischemic ATN. I will recheck labs again today together with urine electrolytes. Recommend continuing fluids at the current rate of 75 mL per hour with continuous monitoring for possible volume overload. Urine output charted appears to be minimal however patient reports that she has been voiding but just not collected accurately. No acute electrolyte abnormalities to prompt intervention. Appears to be close to euvolemic on physical exam. Continue to avoid nephrotoxic including NSAIDs-patient reeducated again and limit exposure to iodinated  intravenous contrast. CPK levels appear to be noncritical and not supportive of rhabdomyolysis. 2. Myositis appreciate input from orthopedic surgery with the direction of management-patient somewhat frustrated that there is not a surgical procedure that can be done to alleviate her pain. 3. Anemia: This is likely anemia of chronic kidney disease as well as compounded by recent inflammation/he is a resistance. Check iron studies and plan for possible re-x-ray. 4. Hyponatremia: Secondary to acute renal failure/free water excretion defect, continue to monitor with improving urine output/isotonic fluids.  Mylene Bow K. 08/11/2014, 2:26 PM

## 2014-08-11 NOTE — Evaluation (Signed)
Occupational Therapy Evaluation Patient Details Name: Tracy [name] MRN: [id number] DOB: 29-Feb-1985 Today's Date: 08/11/2014    History of Present Illness Myositis of the right arm and right thigh. Most likely post physical strain 2 weeks ago while throwing horse feed.   Clinical Impression   Pt was independent prior to admission.  She is able to walk with support of IV pole in room and to bathroom with supervision.  She showers with supervision in standing and requires assistance for drying feet ,under arms and donning socks.  Instructed pt and her mother in use of 3 in 1 as shower seat vs resin chair at home.  Instructed in hemitechniques for bathing and dressing. Pt limited by pain and nausea despite medications for both. Will follow acutely.    Follow Up Recommendations  No OT follow up    Equipment Recommendations  None recommended by OT    Recommendations for Other Services       Precautions / Restrictions Precautions Precautions: None Restrictions Weight Bearing Restrictions: No      Mobility Bed Mobility Overal bed mobility: Needs Assistance Bed Mobility: Supine to Sit     Supine to sit: Min assist     General bed mobility comments: assist to support R LE  Transfers Overall transfer level: Needs assistance Equipment used: None Transfers: Sit to/from Stand Sit to Stand: Supervision         General transfer comment: no physical assist    Balance                                            ADL Overall ADL's : Needs assistance/impaired Eating/Feeding: Set up;Bed level Eating/Feeding Details (indicate cue type and reason): uses L hand Grooming: Wash/dry hands;Wash/dry face;Sitting;Set up   Upper Body Bathing: Supervision/ safety;Standing   Lower Body Bathing: Minimal assistance;Sit to/from stand   Upper Body Dressing : Minimal assistance;Sitting   Lower Body Dressing: Moderate assistance;Sit to/from stand   Toilet  Transfer: Supervision/safety;Ambulation;Regular Toilet   Toileting- Clothing Manipulation and Hygiene: Modified independent;Sitting/lateral lean   Tub/ Shower Transfer: Supervision/safety;Ambulation   Functional mobility during ADLs: Supervision/safety (pushing IV pole with L hand) General ADL Comments: Pt more uncomfortable when sitting to shower vs standing.  Has availability of 3 in 1 to use as a shower seat or to elevate toilet as needed at home.     Vision     Perception     Praxis      Pertinent Vitals/Pain Pain Assessment: Faces Faces Pain Scale: Hurts whole lot Pain Location: R thigh, R upper arm Pain Descriptors / Indicators: Burning;Guarding;Grimacing Pain Intervention(s): Monitored during session;Premedicated before session;Repositioned;Limited activity within patient's tolerance     Hand Dominance Right   Extremity/Trunk Assessment Upper Extremity Assessment Upper Extremity Assessment: RUE deficits/detail RUE Deficits / Details: painful triceps area, full ROM, elevated on pillows   Lower Extremity Assessment Lower Extremity Assessment: Defer to PT evaluation   Cervical / Trunk Assessment Cervical / Trunk Assessment: Normal   Communication Communication Communication: No difficulties   Cognition Arousal/Alertness: Awake/alert Behavior During Therapy: WFL for tasks assessed/performed Overall Cognitive Status: Within Functional Limits for tasks assessed                     General Comments       Exercises       Shoulder Instructions  Home Living Family/patient expects to be discharged to:: Private residence Living Arrangements: Parent Available Help at Discharge: Family;Available PRN/intermittently Type of Home: House Home Access: Stairs to enter [employer] of Steps: 4 Entrance Stairs-Rails: Right;Left     Alternate Level Stairs-Rails: Left Bathroom Shower/Tub: Tub/shower unit;Walk-in shower   Bathroom Toilet:  Standard     Home Equipment: None          Prior Functioning/Environment Level of Independence: Independent        Comments: 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 [employer]    OT Diagnosis: Acute pain   OT Problem List:     OT Treatment/Interventions:      OT Goals(Current goals can be found in the care plan section) Acute Rehab OT Goals Patient Stated Goal: wants to feel better  OT Frequency:     Barriers to D/C:            Co-evaluation              End of Session    Activity Tolerance: Patient limited by pain Patient left: in bed;with call bell/phone within reach;with family/visitor present   Time: 1211-1230 OT Time Calculation (min): 19 min Charges:  OT General Charges $OT Visit: 1 Procedure OT Evaluation $Initial OT Evaluation Tier I: 1 Procedure G-Codes:    Malka So 08/11/2014, 1:00 PM  [phone]

## 2014-08-11 NOTE — Progress Notes (Signed)
Patient Demographics:    Tracy [name], is a 29 y.o. female, DOB - Aug 31, 1985, [id number]  Admit date - 08/09/2014   Admitting Physician Ivor Costa, MD  Outpatient Primary MD for the patient is [name]  LOS - 2   Chief Complaint  Patient presents with  . Arm Pain        Subjective:    Tracy [name] today has, No headache, No chest pain, No abdominal pain - No Nausea, No new weakness tingling or numbness, No Cough - SOB. Continues to have right arm and right thigh pain.   Assessment  & Plan :     1. Myositis of the right arm and right thigh. Most likely post physical strain 2 weeks ago while throwing horse feed. Case discussed with rheumatologist Dr. Amil Amen over the phone, check aldolase along with ANA levels, CK mildly elevated. For now supportive care only, general surgery, hand surgery and orthopedics both on board. For now continue IV anti-biotics as infection has not completely been ruled out. Ultrasound of the right upper arm does not show a clot. Continue supportive care she has marginally improved, question myositis due to combination of strain, uncontrolled diabetes and hypothyroidism.    2. Hypothyroidism. Was not taking Synthroid for the last couple of months, counseled on compliance, TSH was 25, Synthroid resumed monitor TSH    3. Anemia - Check anemia panel worse with dilution from IV fluids. Monitor.    4.ARF of CKD 4 - a slight creatinine of 3, had some nausea vomiting and NSAID use prior to admission, likely ATN. Avoid nephrotoxic, continue hydration, renal ultrasound shows cyst but no obstruction, urine output stable, request renal to evaluate.     5. DM1 - poor outpatient control with A1c of 8.9 , on Lantus and sliding scale for now monitor CBGs.  CBG  (last 3)   Recent Labs  08/10/14 1631 08/10/14 2138 08/11/14 0754  GLUCAP 147* 126* 188*    Lab Results  Component Value Date   HGBA1C 8.9* 08/10/2014      Code Status : Full  Family Communication  : Mother bedside  Disposition Plan  : Home   Consults  :  Orthopedics, hand surgery, general surgery, Renal  Procedures  : Right upper extremity venous duplex. No DVT, MRI right arm, CT right thigh  DVT Prophylaxis  :   Heparin    Lab Results  Component Value Date   PLT 250 08/11/2014    Inpatient Medications  Scheduled Meds: . amLODipine  10 mg Oral Daily  . aztreonam  1 g Intravenous Q8H  . heparin  5,000 Units Subcutaneous 3 times per day  . insulin aspart  0-9 Units Subcutaneous TID WC  . insulin glargine  12 Units Subcutaneous Daily  . levothyroxine  125 mcg Oral QAC breakfast  . multivitamin with minerals  1 tablet Oral Daily  . sodium chloride  1,000 mL Intravenous Once  . sodium chloride  3 mL Intravenous Q12H  . vancomycin  1,000 mg Intravenous Q48H   Continuous Infusions: . sodium chloride 125 mL/hr at 08/11/14 0230   PRN Meds:.acetaminophen **OR** acetaminophen, alum & mag hydroxide-simeth, hydrALAZINE, HYDROcodone-acetaminophen, HYDROmorphone (DILAUDID) injection, ondansetron  Antibiotics  :     Anti-infectives  Start     Dose/Rate Route Frequency Ordered Stop   08/11/14 2200  vancomycin (VANCOCIN) IVPB 1000 mg/200 mL premix     1,000 mg 200 mL/hr over 60 Minutes Intravenous Every 48 hours 08/09/14 2256     08/10/14 1045  vancomycin (VANCOCIN) IVPB 750 mg/150 ml premix  Status:  Discontinued     750 mg 150 mL/hr over 60 Minutes Intravenous Every 12 hours 08/09/14 2252 08/09/14 2256   08/09/14 2230  aztreonam (AZACTAM) 1 g in dextrose 5 % 50 mL IVPB     1 g 100 mL/hr over 30 Minutes Intravenous Every 8 hours 08/09/14 2215     08/09/14 2200  vancomycin (VANCOCIN) IVPB 1000 mg/200 mL premix  Status:  Discontinued     1,000 mg 200 mL/hr over 60  Minutes Intravenous Every 48 hours 08/09/14 2159 08/09/14 2252   08/09/14 2145  vancomycin (VANCOCIN) IVPB 1000 mg/200 mL premix  Status:  Discontinued     1,000 mg 200 mL/hr over 60 Minutes Intravenous  Once 08/09/14 2137 08/09/14 2147        Objective:   Filed Vitals:   08/10/14 1700 08/10/14 1753 08/10/14 2056 08/11/14 0545  BP:  145/71 154/76 135/78  Pulse:  96 93 92  Temp:  97.9 F (36.6 C) 99 F (37.2 C) 98.6 F (37 C)  TempSrc:  Oral Oral Oral  Resp:  20 17 18   Height: 5\' 3"  (1.6 m)     Weight:      SpO2:  100% 100% 98%    Wt Readings from Last 3 Encounters:  08/09/14 68.04 kg (150 lb)  08/08/14 68.493 kg (151 lb)  08/05/14 68.493 kg (151 lb)     Intake/Output Summary (Last 24 hours) at 08/11/14 0921 Last data filed at 08/11/14 0600  Gross per 24 hour  Intake   4060 ml  Output      0 ml  Net   4060 ml     Physical Exam  Awake Alert, Oriented X 3, No new F.N deficits, Normal affect [place].AT,PERRAL Supple Neck,No JVD, No cervical lymphadenopathy appriciated.  Symmetrical Chest wall movement, Good air movement bilaterally, CTAB RRR,No Gallops,Rubs or new Murmurs, No Parasternal Heave +ve B.Sounds, Abd Soft, No tenderness, No organomegaly appriciated, No rebound - guarding or rigidity. No Cyanosis, Clubbing or edema, No new Rash or bruise  Swelling in the right arm and right thigh, some tenderness, no warmth, no signs of compartment syndrome    Data Review:   Micro Results Recent Results (from the past 240 hour(s))  Urine culture     Status: None (Preliminary result)   Collection Time: 08/09/14  1:57 PM  Result Value Ref Range Status   Specimen Description URINE, CLEAN CATCH  Final   Special Requests NONE  Final   Culture CULTURE REINCUBATED FOR BETTER GROWTH  Final   Report Status PENDING  Incomplete    Radiology Reports Dg Elbow Complete Right  08/05/2014   CLINICAL DATA:  Right elbow pain that radiates to the shoulder. Pain started after picking  up horse feed.  EXAM: RIGHT ELBOW - COMPLETE 3+ VIEW  COMPARISON:  None.  FINDINGS: There is no evidence of fracture, dislocation, or joint effusion. There is no evidence of arthropathy or other focal bone abnormality. Soft tissues are unremarkable.  IMPRESSION: Negative.   Electronically Signed   By: Markus Daft M.D.   On: 08/05/2014 08:05   Mr Humerus Right Wo Contrast  08/10/2014   CLINICAL DATA:  Right  arm pain after an injury throwing a bag of horse feed 2 weeks ago.  EXAM: MRI OF THE RIGHT HUMERUS WITHOUT CONTRAST  TECHNIQUE: Multiplanar, multisequence MR imaging was performed. No intravenous contrast was administered.  COMPARISON:  MRI dated 08/08/2014 and radiograph dated 05/05/2014  FINDINGS: The edema in the right triceps muscle is even more extensive than on the prior study. The muscle is larger than on the prior study. There is only slight sparing of the proximal aspect of the muscle. Subcutaneous edema is more prominent. The underlying humerus and the neurovascular bundle are normal. The focal area of abnormal signal at the distal mild tendinous junction is unchanged.  IMPRESSION: Progressive edema of the triceps muscle consistent with myositis. Focal area of abnormal signal at the mild tendinous junction could represent a tear or small abscess but this is not progressed. Given the patient's elevated white blood count, infectious myositis must be considered.   Electronically Signed   By: Lorriane Shire M.D.   On: 08/10/2014 08:50   Mr Humerus Right Wo Contrast  08/09/2014   CLINICAL DATA:  Pain throughout entire right humerus for 2 weeks.  EXAM: MRI OF THE RIGHT HUMERUS WITHOUT CONTRAST  TECHNIQUE: Multiplanar, multisequence MR imaging was performed. No intravenous contrast was administered.  COMPARISON:  None.  FINDINGS: No marrow signal abnormality. No fracture or dislocation. There is no bone destruction or periosteal reaction.  There is severe edema within the right triceps muscle with small  areas of sparing proximally. At the distal musculotendinous junction of the medial head of the triceps, there is a complex 2 x 2 x 2.3 cm T2 signal abnormality with central T2 hyperintensity and peripheral intermediate signal. There is mild edema in the adjacent subcutaneous fat.  The biceps musculature is normal. There is no other fluid collection or soft tissue mass. The neurovascular bundles are grossly normal. There are multiple prominent right axillary lymph nodes.  IMPRESSION: 1. Severe edema within the right triceps muscle with small areas of sparing proximally. At the distal musculotendinous junction of the medial head of the triceps, there is a complex 2 x 2 x 2.3 cm T2 signal abnormality. The overall appearance is concerning for myositis which may be secondary to an infectious or inflammatory etiology. The 2 cm area of focal abnormal signal which may reflect a hematoma secondary to a partial tear given the location is at the musculotendinous junction of the medial head of the triceps, but given the extensiveness of the muscle edema, this may reflect a developing abscess versus myonecrosis.   Electronically Signed   By: Kathreen Devoid   On: 08/09/2014 08:31   US Renal  08/10/2014   CLINICAL DATA:  Acute kidney injury.  Elevated creatinine level.  EXAM: RENAL / URINARY TRACT ULTRASOUND COMPLETE  COMPARISON:  CT examination 03/09/2013 and ultrasound 10/04/2008  FINDINGS: Right Kidney:  Length: 10.6 cm. The right kidney parenchyma is echogenic without hydronephrosis. There is a heterogeneous hypoechoic structure along the upper pole measuring up to 1.1 cm. Previously, the patient had an abscess in the right kidney and unclear if this sequela from the abscess or a new small complex lesion.  Left Kidney:  Length: 10.6 cm. Left kidney is not well visualized but appears to be echogenic. Negative for hydronephrosis.  Bladder:  Appears normal for degree of bladder distention.  IMPRESSION: Negative for  hydronephrosis.  Increased echogenicity of both kidneys. Findings raise concern for chronic medical renal disease.  There is a 1.1 cm heterogeneous hypoechoic  structure in the right kidney upper pole. Unclear if this is the sequelae of previous renal abscess versus new small complex cystic lesion. Evaluation of this area will be difficult due to acute kidney injury and an elevated creatinine level. However, this may be better characterized with a non contrast MRI versus close surveillance with ultrasound.   Electronically Signed   By: Markus Daft M.D.   On: 08/10/2014 11:34   Ct Femur Right Wo Contrast  08/10/2014   CLINICAL DATA:  Myositis. Sudden onset of pain while lifting a feed bag 2 weeks ago.  EXAM: CT OF THE LOWER RIGHT EXTREMITY WITHOUT CONTRAST  TECHNIQUE: Multidetector CT imaging of the right femur and right tibia fibula was performed according to the standard protocol.  COMPARISON:  None.  FINDINGS: There is no bony abnormality. There is no radiopaque foreign body. There is no soft tissue mass. There is no abscess or drainable fluid collection. There is no soft tissue gas.  There is soft tissue edema around the muscles of the right lower extremity, particularly around the adductor of the via and the quadriceps musculature. This is consistent with the clinically described suspicion of myositis. There is a small knee joint effusion.  IMPRESSION: Soft tissue edema surrounding the musculature of the right lower extremity consistent with myositis. No mass or drainable fluid collection.   Electronically Signed   By: Andreas Newport M.D.   On: 08/10/2014 03:49   Ct Tibia Fibula Right Wo Contrast  08/10/2014   CLINICAL DATA:  Myositis. Sudden onset of pain while lifting a feed bag 2 weeks ago.  EXAM: CT OF THE LOWER RIGHT EXTREMITY WITHOUT CONTRAST  TECHNIQUE: Multidetector CT imaging of the right femur and right tibia fibula was performed according to the standard protocol.  COMPARISON:  None.  FINDINGS:  There is no bony abnormality. There is no radiopaque foreign body. There is no soft tissue mass. There is no abscess or drainable fluid collection. There is no soft tissue gas.  There is soft tissue edema around the muscles of the right lower extremity, particularly around the adductor of the via and the quadriceps musculature. This is consistent with the clinically described suspicion of myositis. There is a small knee joint effusion.  IMPRESSION: Soft tissue edema surrounding the musculature of the right lower extremity consistent with myositis. No mass or drainable fluid collection.   Electronically Signed   By: Andreas Newport M.D.   On: 08/10/2014 03:49   Dg Femur, Min 2 Views Right  08/10/2014   CLINICAL DATA:  Right thigh pain. Concern for myositis. Rule out soft tissue air.  EXAM: RIGHT FEMUR 2 VIEWS  COMPARISON:  None.  FINDINGS: Cortical margins of the right femur are intact. No fracture, focal osseous lesion, erosion or periosteal reaction. No soft tissue air or radiopaque foreign body. Questionable soft tissue edema proximally.  IMPRESSION: Suspect mild soft tissue edema proximally. No soft tissue air, radiopaque foreign body, or acute osseous abnormality.   Electronically Signed   By: Jeb Levering M.D.   On: 08/10/2014 01:48     CBC  Recent Labs Lab 08/09/14 1819 08/10/14 0128 08/11/14 0507  WBC 22.6* 18.7* 16.9*  HGB 9.5* 7.8* 7.4*  HCT 28.5* 23.4* 23.0*  PLT 308 242 250  MCV 82.6 83.0 84.2  MCH 27.5 27.7 27.1  MCHC 33.3 33.3 32.2  RDW 13.6 13.6 14.0  LYMPHSABS 1.5  --  1.0  MONOABS 1.5*  --  1.5*  EOSABS 0.0  --  0.1  BASOSABS 0.0  --  0.1    Chemistries   Recent Labs Lab 08/09/14 1819 08/09/14 2350 08/10/14 0128 08/11/14 0507  NA 136  --  135 132*  K 4.5  --  4.0 4.7  CL 105  --  103 105  CO2 22  --  21* 17*  GLUCOSE 68  --  101* 176*  BUN 41*  --  39* 42*  CREATININE 5.46*  --  5.33* 5.39*  CALCIUM 9.0  --  8.2* 8.5*  MG  --  2.0  --   --   AST 28   --  38  --   ALT 15  --  18  --   ALKPHOS 127*  --  166*  --   BILITOT 0.5  --  0.6  --    ------------------------------------------------------------------------------------------------------------------ estimated creatinine clearance is 14.1 mL/min (by C-G formula based on Cr of 5.39). ------------------------------------------------------------------------------------------------------------------  Recent Labs  08/10/14 0128  HGBA1C 8.9*   ------------------------------------------------------------------------------------------------------------------ No results for input(s): CHOL, HDL, LDLCALC, TRIG, CHOLHDL, LDLDIRECT in the last 72 hours. ------------------------------------------------------------------------------------------------------------------  Recent Labs  08/10/14 0911  TSH 25.005*  T3FREE 1.7*   ------------------------------------------------------------------------------------------------------------------  Recent Labs  08/10/14 1220  VITAMINB12 545  FOLATE 12.8  FERRITIN 127  TIBC 179*  IRON 9*  RETICCTPCT 1.4    Coagulation profile  Recent Labs Lab 08/09/14 2350  INR 1.31    No results for input(s): DDIMER in the last 72 hours.  Cardiac Enzymes No results for input(s): CKMB, TROPONINI, MYOGLOBIN in the last 168 hours.  Invalid input(s): CK ------------------------------------------------------------------------------------------------------------------ Invalid input(s): POCBNP   Time Spent in minutes  35   SINGH,PRASHANT K M.D on 08/11/2014 at 9:21 AM  Between 7am to 7pm - Pager - [phone]  After 7pm go to [email or web address] - password [facility]  [name]   Office  [phone]

## 2014-08-12 ENCOUNTER — Inpatient Hospital Stay (HOSPITAL_COMMUNITY): Payer: BLUE CROSS/BLUE SHIELD

## 2014-08-12 LAB — GLUCOSE, CAPILLARY
Glucose-Capillary: 226 mg/dL — ABNORMAL HIGH (ref 65–99)
Glucose-Capillary: 185 mg/dL — ABNORMAL HIGH (ref 65–99)
Glucose-Capillary: 154 mg/dL — ABNORMAL HIGH (ref 65–99)
Glucose-Capillary: 153 mg/dL — ABNORMAL HIGH (ref 65–99)

## 2014-08-12 LAB — CK: Total CK: 192 U/L (ref 38–234)

## 2014-08-12 LAB — CBC
HCT: 22.8 % — ABNORMAL LOW (ref 36.0–46.0)
Hemoglobin: 7.3 g/dL — ABNORMAL LOW (ref 12.0–15.0)
MCH: 26.8 pg (ref 26.0–34.0)
MCHC: 32 g/dL (ref 30.0–36.0)
MCV: 83.8 fL (ref 78.0–100.0)
PLATELETS: 276 10*3/uL (ref 150–400)
RBC: 2.72 MIL/uL — AB (ref 3.87–5.11)
RDW: 13.9 % (ref 11.5–15.5)
WBC: 15 10*3/uL — ABNORMAL HIGH (ref 4.0–10.5)

## 2014-08-12 LAB — RENAL FUNCTION PANEL
Albumin: 1.8 g/dL — ABNORMAL LOW (ref 3.5–5.0)
Anion gap: 14 (ref 5–15)
BUN: 47 mg/dL — AB (ref 6–20)
CO2: 15 mmol/L — AB (ref 22–32)
CREATININE: 5.91 mg/dL — AB (ref 0.44–1.00)
Calcium: 8.5 mg/dL — ABNORMAL LOW (ref 8.9–10.3)
Chloride: 102 mmol/L (ref 101–111)
GFR calc non Af Amer: 9 mL/min — ABNORMAL LOW (ref >60)
GFR, EST AFRICAN AMERICAN: 10 mL/min — AB (ref >60)
Glucose, Bld: 215 mg/dL — ABNORMAL HIGH (ref 65–99)
POTASSIUM: 4.6 mmol/L (ref 3.5–5.1)
Phosphorus: 6 mg/dL — ABNORMAL HIGH (ref 2.5–4.6)
Sodium: 131 mmol/L — ABNORMAL LOW (ref 135–145)

## 2014-08-12 LAB — T4: T4 TOTAL: 6.3 ug/dL (ref 4.5–12.0)

## 2014-08-12 MED ORDER — SODIUM BICARBONATE 650 MG PO TABS
1300.0000 mg | ORAL_TABLET | Freq: Two times a day (BID) | ORAL | Status: AC
  Administered 2014-08-12 – 2014-08-16 (×8): 1300 mg via ORAL
  Filled 2014-08-12 (×11): qty 2

## 2014-08-12 MED ORDER — INSULIN GLARGINE 100 UNIT/ML ~~LOC~~ SOLN
16.0000 [IU] | Freq: Every day | SUBCUTANEOUS | Status: DC
  Filled 2014-08-12: qty 0.16

## 2014-08-12 MED ORDER — FAMOTIDINE 40 MG PO TABS
40.0000 mg | ORAL_TABLET | Freq: Two times a day (BID) | ORAL | Status: DC
  Administered 2014-08-12 – 2014-08-16 (×9): 40 mg via ORAL
  Filled 2014-08-12: qty 1
  Filled 2014-08-12: qty 2
  Filled 2014-08-12 (×9): qty 1

## 2014-08-12 NOTE — Progress Notes (Signed)
     Subjective:  Patient reports pain as mild to moderate.  Pain improved today in general, but she still complains of right groin and leg pain.  Still limited in ROM of the RUE and RLE with PT due to pain.  She was able to ambulate in the halls and shower today.   Objective:   VITALS:   Filed Vitals:   08/11/14 2136 08/12/14 0510 08/12/14 0854 08/12/14 1701  BP: 162/77 177/78 150/79 160/85  Pulse: 105 98 99 102  Temp: 98.5 F (36.9 C) 98.3 F (36.8 C) 99 F (37.2 C) 98.1 F (36.7 C)  TempSrc: Oral Oral Oral Oral  Resp: 18 20 20 20   Height:      Weight:      SpO2: 98% 97% 97% 98%    Neurologically intact ABD soft Neurovascular intact Sensation intact distally Intact pulses distally Dorsiflexion/Plantar flexion intact No erythema or warmth of the RUE/RLE.  Tender to palpation over abductor and triceps.  Lab Results  Component Value Date   WBC 15.0* 08/12/2014   HGB 7.3* 08/12/2014   HCT 22.8* 08/12/2014   MCV 83.8 08/12/2014   PLT 276 08/12/2014   BMET    Component Value Date/Time   NA 131* 08/12/2014 0538   NA 133* 01/18/2013 1106   K 4.6 08/12/2014 0538   CL 102 08/12/2014 0538   CO2 15* 08/12/2014 0538   GLUCOSE 215* 08/12/2014 0538   GLUCOSE 929* 01/18/2013 1106   BUN 47* 08/12/2014 0538   BUN 37* 01/18/2013 1106   CREATININE 5.91* 08/12/2014 0538   CALCIUM 8.5* 08/12/2014 0538   GFRNONAA 9* 08/12/2014 0538   GFRAA 10* 08/12/2014 0538     Assessment/Plan:     Principal Problem:   Myositis Active Problems:   Proliferative diabetic retinopathy   Hypothyroidism   Normocytic anemia   Acute renal failure superimposed on stage 4 chronic kidney disease   Seizure   Sepsis   DM I (diabetes mellitus, type I), uncontrolled   Essential hypertension   Up with therapy WBAT in the RUE and RLE Heparin for DVT prophylaxis per medicine  No indication for surgery at this time. MRI reveals small intrasubstance fluid collection of the RUE that is most  likely consistent with a musculotentinous tear but could be a small abscess. There was also a 36mm or more focal area of the R thigh that could represent a fluid collection within the vastus medialis. If she continues to have not improvement in symptoms, would consider IR guided aspiration of one or both areas.  WBC trending down and pain slightly improved today, so will continue to monitor.  Continue abx.    Tracy [name] [name] 08/12/2014, 5:07 PM Cell [phone]

## 2014-08-12 NOTE — Progress Notes (Signed)
Patient Demographics:    Tracy [name], is a 29 y.o. female, DOB - [birth year], [id number]  Admit date - 08/09/2014   Admitting Physician Ivor Costa, MD  Outpatient Primary MD for the patient is [name]  LOS - 3   Chief Complaint  Patient presents with  . Arm Pain        Subjective:    Tracy [name] today has, No headache, No chest pain, No abdominal pain - No Nausea, No new weakness tingling or numbness, No Cough - SOB. Continues to have right arm and right thigh pain but feels some better   Assessment  & Plan :     1. Myositis of the right arm and right thigh. Most likely post physical strain 2 weeks ago while throwing horse feed. Case discussed with rheumatologist Dr. Amil Amen over the phone, ANA is negative, aldolase is mildly elevated and will be repeated to follow trend, CK mildly elevated. For now supportive care only, general surgery, hand surgery and orthopedics both on board.   Will continue IV anti-biotics as infection has not completely been ruled out. Ultrasound of the right upper arm does not show a clot. He shouldn't says that she continues to feel mild improvement in the last few days, MRI shows consistent myositis in both right-sided extremities, we'll defer any surgical intervention to hand surgery and orthopedic surgery.   In my opinion her myositis is likely initiated by recent physical stress of moving the horse feed predisposed and worsened by uncontrolled hypothyroidism and diabetes.      2. Hypothyroidism. Was not taking Synthroid for the last couple of months, counseled on compliance, TSH was 25, Synthroid resumed monitor TSH    3. Anemia - iron deficiency on into anemia panel, IV and replaced. Monitor H&H. Transfuse if hemoglobin drops below 7. Outpatient  workup for iron deficiency anemia in a menstruating female as appropriate.    4.ARF of CKD 4 - a slight creatinine of 3, had some nausea vomiting and NSAID use prior to admission, likely ATN. Avoid nephrotoxic, continue hydration, renal ultrasound shows cyst but no obstruction, urine output stable, request renal to evaluate.     5. DM1 - poor outpatient control with A1c of 8.9 , on Lantus dose adjusted continue ISS & monitor CBGs.  CBG (last 3)   Recent Labs  08/11/14 1627 08/11/14 2017 08/12/14 0853  GLUCAP 177* 134* 226*    Lab Results  Component Value Date   HGBA1C 8.9* 08/10/2014      Code Status : Full  Family Communication  : Mother bedside  Disposition Plan  : Home   Consults  :  Orthopedics, hand surgery, general surgery, Renal  Procedures  : Right upper extremity venous duplex. No DVT, MRI right arm, CT right thigh  DVT Prophylaxis  :   Heparin    Lab Results  Component Value Date   PLT 276 08/12/2014    Inpatient Medications  Scheduled Meds: . amLODipine  10 mg Oral Daily  . aztreonam  1 g Intravenous Q8H  . heparin  5,000 Units Subcutaneous 3 times per day  . insulin aspart  0-9 Units Subcutaneous TID WC  . insulin glargine  12 Units Subcutaneous Daily  . iron dextran (INFED/DEXFERRUM) infusion  500 mg Intravenous Daily  . levothyroxine  125 mcg Oral QAC breakfast  . multivitamin with minerals  1 tablet Oral Daily  . sodium chloride  1,000 mL Intravenous Once  . sodium chloride  3 mL Intravenous Q12H  . vancomycin  1,000 mg Intravenous Q48H   Continuous Infusions: . sodium chloride 100 mL/hr at 08/12/14 1047   PRN Meds:.acetaminophen **OR** acetaminophen, alum & mag hydroxide-simeth, hydrALAZINE, HYDROcodone-acetaminophen, HYDROmorphone (DILAUDID) injection, ondansetron, zolpidem  Antibiotics  :     Anti-infectives    Start     Dose/Rate Route Frequency Ordered Stop   08/11/14 2200  vancomycin (VANCOCIN) IVPB 1000 mg/200 mL premix      1,000 mg 200 mL/hr over 60 Minutes Intravenous Every 48 hours 08/09/14 2256     08/10/14 1045  vancomycin (VANCOCIN) IVPB 750 mg/150 ml premix  Status:  Discontinued     750 mg 150 mL/hr over 60 Minutes Intravenous Every 12 hours 08/09/14 2252 08/09/14 2256   08/09/14 2230  aztreonam (AZACTAM) 1 g in dextrose 5 % 50 mL IVPB     1 g 100 mL/hr over 30 Minutes Intravenous Every 8 hours 08/09/14 2215     08/09/14 2200  vancomycin (VANCOCIN) IVPB 1000 mg/200 mL premix  Status:  Discontinued     1,000 mg 200 mL/hr over 60 Minutes Intravenous Every 48 hours 08/09/14 2159 08/09/14 2252   08/09/14 2145  vancomycin (VANCOCIN) IVPB 1000 mg/200 mL premix  Status:  Discontinued     1,000 mg 200 mL/hr over 60 Minutes Intravenous  Once 08/09/14 2137 08/09/14 2147        Objective:   Filed Vitals:   08/11/14 1747 08/11/14 2136 08/12/14 0510 08/12/14 0854  BP: 145/80 162/77 177/78 150/79  Pulse: 88 105 98 99  Temp: 98 F (36.7 C) 98.5 F (36.9 C) 98.3 F (36.8 C) 99 F (37.2 C)  TempSrc: Oral Oral Oral Oral  Resp: 18 18 20 20   Height:      Weight:      SpO2: 98% 98% 97% 97%    Wt Readings from Last 3 Encounters:  08/09/14 68.04 kg (150 lb)  08/08/14 68.493 kg (151 lb)  08/05/14 68.493 kg (151 lb)     Intake/Output Summary (Last 24 hours) at 08/12/14 1051 Last data filed at 08/12/14 0600  Gross per 24 hour  Intake 3197.5 ml  Output   1525 ml  Net 1672.5 ml     Physical Exam  Awake Alert, Oriented X 3, No new F.N deficits, Normal affect [place].AT,PERRAL Supple Neck,No JVD, No cervical lymphadenopathy appriciated.  Symmetrical Chest wall movement, Good air movement bilaterally, CTAB RRR,No Gallops,Rubs or new Murmurs, No Parasternal Heave +ve B.Sounds, Abd Soft, No tenderness, No organomegaly appriciated, No rebound - guarding or rigidity. No Cyanosis, Clubbing or edema, No new Rash or bruise  Swelling in the right arm and right thigh, some tenderness, no warmth, no signs of  compartment syndrome    Data Review:   Micro Results Recent Results (from the past 240 hour(s))  Urine culture     Status: None   Collection Time: 08/09/14  1:57 PM  Result Value Ref Range Status   Specimen Description URINE, CLEAN CATCH  Final   Special Requests NONE  Final   Culture >=100,000 COLONIES/mL LACTOBACILLUS SPECIES  Final   Report Status 08/11/2014 FINAL  Final  Culture, blood (x 2)     Status: None (Preliminary result)   Collection Time: 08/09/14 11:42 PM  Result Value  Ref Range Status   Specimen Description BLOOD LEFT HAND  Final   Special Requests BOTTLES DRAWN AEROBIC ONLY [place]  Final   Culture NO GROWTH 1 DAY  Final   Report Status PENDING  Incomplete  Culture, blood (x 2)     Status: None (Preliminary result)   Collection Time: 08/09/14 11:50 PM  Result Value Ref Range Status   Specimen Description BLOOD RIGHT HAND  Final   Special Requests   Final    BOTTLES DRAWN AEROBIC AND ANAEROBIC [place] BLUE 5CC PURPLE   Culture NO GROWTH 1 DAY  Final   Report Status PENDING  Incomplete    Radiology Reports Dg Elbow Complete Right  08/05/2014   CLINICAL DATA:  Right elbow pain that radiates to the shoulder. Pain started after picking up horse feed.  EXAM: RIGHT ELBOW - COMPLETE 3+ VIEW  COMPARISON:  None.  FINDINGS: There is no evidence of fracture, dislocation, or joint effusion. There is no evidence of arthropathy or other focal bone abnormality. Soft tissues are unremarkable.  IMPRESSION: Negative.   Electronically Signed   By: Markus Daft M.D.   On: 08/05/2014 08:05   Mr Humerus Right Wo Contrast  08/12/2014   CLINICAL DATA:  Myositis in the right arm.  Injury 2 weeks ago.  EXAM: MRI OF THE RIGHT HUMERUS WITHOUT CONTRAST  TECHNIQUE: Multiplanar, multisequence MR imaging was performed. No intravenous contrast was administered.  COMPARISON:  08/10/2014  FINDINGS: Dependent atelectasis in the right lower lobe.  There is considerable abnormal edema in the lateral, long, and  medial head of the triceps diffusely. The internal tendon of the medial head seems torn on images 20 through 23 of series 7, with an associated 2.2 by 0.6 by 1.4 cm fluid collection along the proximal margin of the tendon.  There is edema in the coracobrachialis muscle tendon a small medial portion of the brachialis muscle.  Left axillary lymph nodes measure up to 1.2 cm in short axis. Edema tracks along the axillary neurovascular structures.  Fluid signal tracks superficial to the posterior deltoid muscle and superficial to the latissimus dorsi and infraspinatus and teres minor muscles.  Subcutaneous edema tracks in the upper arm circumferentially, somewhat more confluent anteriorly.  Small elbow joint effusion.  No significant abnormal osseous edema.  IMPRESSION: 1. Again observed is extensive myositis primarily involving the triceps and coracobrachialis muscles, with a small intrasubstance fluid collection along the proximal tendinous margin of the medial head of the triceps which may represent musculotendinous tear or less likely a very small abscess. This collection is similar to prior. Possibilities include posttraumatic myositis, infection, or a vascular etiology. Mild reactive axillary adenopathy on the right may suggest an infectious component. 2. Subcutaneous edema in the upper arm, mildly increased. Cellulitis not excluded. 3. Edema tracks along the superficial margin of the deltoid muscle, teres minor, and infraspinatus as well as the latissimus dorsi. This may be incidental but shear injury cannot be excluded.   Electronically Signed   By: Van Clines M.D.   On: 08/12/2014 08:46   Mr Humerus Right Wo Contrast  08/10/2014   CLINICAL DATA:  Right arm pain after an injury throwing a bag of horse feed 2 weeks ago.  EXAM: MRI OF THE RIGHT HUMERUS WITHOUT CONTRAST  TECHNIQUE: Multiplanar, multisequence MR imaging was performed. No intravenous contrast was administered.  COMPARISON:  MRI dated  08/08/2014 and radiograph dated 05/05/2014  FINDINGS: The edema in the right triceps muscle is even more extensive than  on the prior study. The muscle is larger than on the prior study. There is only slight sparing of the proximal aspect of the muscle. Subcutaneous edema is more prominent. The underlying humerus and the neurovascular bundle are normal. The focal area of abnormal signal at the distal mild tendinous junction is unchanged.  IMPRESSION: Progressive edema of the triceps muscle consistent with myositis. Focal area of abnormal signal at the mild tendinous junction could represent a tear or small abscess but this is not progressed. Given the patient's elevated white blood count, infectious myositis must be considered.   Electronically Signed   By: Lorriane Shire M.D.   On: 08/10/2014 08:50   Mr Humerus Right Wo Contrast  08/09/2014   CLINICAL DATA:  Pain throughout entire right humerus for 2 weeks.  EXAM: MRI OF THE RIGHT HUMERUS WITHOUT CONTRAST  TECHNIQUE: Multiplanar, multisequence MR imaging was performed. No intravenous contrast was administered.  COMPARISON:  None.  FINDINGS: No marrow signal abnormality. No fracture or dislocation. There is no bone destruction or periosteal reaction.  There is severe edema within the right triceps muscle with small areas of sparing proximally. At the distal musculotendinous junction of the medial head of the triceps, there is a complex 2 x 2 x 2.3 cm T2 signal abnormality with central T2 hyperintensity and peripheral intermediate signal. There is mild edema in the adjacent subcutaneous fat.  The biceps musculature is normal. There is no other fluid collection or soft tissue mass. The neurovascular bundles are grossly normal. There are multiple prominent right axillary lymph nodes.  IMPRESSION: 1. Severe edema within the right triceps muscle with small areas of sparing proximally. At the distal musculotendinous junction of the medial head of the triceps, there is a  complex 2 x 2 x 2.3 cm T2 signal abnormality. The overall appearance is concerning for myositis which may be secondary to an infectious or inflammatory etiology. The 2 cm area of focal abnormal signal which may reflect a hematoma secondary to a partial tear given the location is at the musculotendinous junction of the medial head of the triceps, but given the extensiveness of the muscle edema, this may reflect a developing abscess versus myonecrosis.   Electronically Signed   By: Kathreen Devoid   On: 08/09/2014 08:31   US Renal  08/10/2014   CLINICAL DATA:  Acute kidney injury.  Elevated creatinine level.  EXAM: RENAL / URINARY TRACT ULTRASOUND COMPLETE  COMPARISON:  CT examination 03/09/2013 and ultrasound 10/04/2008  FINDINGS: Right Kidney:  Length: 10.6 cm. The right kidney parenchyma is echogenic without hydronephrosis. There is a heterogeneous hypoechoic structure along the upper pole measuring up to 1.1 cm. Previously, the patient had an abscess in the right kidney and unclear if this sequela from the abscess or a new small complex lesion.  Left Kidney:  Length: 10.6 cm. Left kidney is not well visualized but appears to be echogenic. Negative for hydronephrosis.  Bladder:  Appears normal for degree of bladder distention.  IMPRESSION: Negative for hydronephrosis.  Increased echogenicity of both kidneys. Findings raise concern for chronic medical renal disease.  There is a 1.1 cm heterogeneous hypoechoic structure in the right kidney upper pole. Unclear if this is the sequelae of previous renal abscess versus new small complex cystic lesion. Evaluation of this area will be difficult due to acute kidney injury and an elevated creatinine level. However, this may be better characterized with a non contrast MRI versus close surveillance with ultrasound.   Electronically Signed  By: Markus Daft M.D.   On: 08/10/2014 11:34   Ct Femur Right Wo Contrast  08/10/2014   CLINICAL DATA:  Myositis. Sudden onset of pain  while lifting a feed bag 2 weeks ago.  EXAM: CT OF THE LOWER RIGHT EXTREMITY WITHOUT CONTRAST  TECHNIQUE: Multidetector CT imaging of the right femur and right tibia fibula was performed according to the standard protocol.  COMPARISON:  None.  FINDINGS: There is no bony abnormality. There is no radiopaque foreign body. There is no soft tissue mass. There is no abscess or drainable fluid collection. There is no soft tissue gas.  There is soft tissue edema around the muscles of the right lower extremity, particularly around the adductor of the via and the quadriceps musculature. This is consistent with the clinically described suspicion of myositis. There is a small knee joint effusion.  IMPRESSION: Soft tissue edema surrounding the musculature of the right lower extremity consistent with myositis. No mass or drainable fluid collection.   Electronically Signed   By: Andreas Newport M.D.   On: 08/10/2014 03:49   Ct Tibia Fibula Right Wo Contrast  08/10/2014   CLINICAL DATA:  Myositis. Sudden onset of pain while lifting a feed bag 2 weeks ago.  EXAM: CT OF THE LOWER RIGHT EXTREMITY WITHOUT CONTRAST  TECHNIQUE: Multidetector CT imaging of the right femur and right tibia fibula was performed according to the standard protocol.  COMPARISON:  None.  FINDINGS: There is no bony abnormality. There is no radiopaque foreign body. There is no soft tissue mass. There is no abscess or drainable fluid collection. There is no soft tissue gas.  There is soft tissue edema around the muscles of the right lower extremity, particularly around the adductor of the via and the quadriceps musculature. This is consistent with the clinically described suspicion of myositis. There is a small knee joint effusion.  IMPRESSION: Soft tissue edema surrounding the musculature of the right lower extremity consistent with myositis. No mass or drainable fluid collection.   Electronically Signed   By: Andreas Newport M.D.   On: 08/10/2014 03:49    Mr Femur Right Wo Contrast  08/12/2014   CLINICAL DATA:  Myositis of the right arm and right thigh. Status post physical strain 2 weeks ago.  EXAM: MRI OF THE RIGHT FEMUR WITHOUT CONTRAST  TECHNIQUE: Multiplanar, multisequence MR imaging of the right femur was performed. No intravenous contrast was administered.  COMPARISON:  CT right femur 08/10/2014  FINDINGS: There is severe muscle edema within the vastus medialis and intermedius muscle. There is a 14 mm more focal area of fluid signal within the vastus medialis which may reflect a more focal edema versus a small fluid collection. There is mild muscle edema within the vastus lateralis and rectus femoris. There is severe muscle edema within the sartorius muscle. There is a small amount of fluid along the fascia of the flexor compartment. There is small amount of fluid along the fascia of the flexor compartment and around the adductor compartment. There is no drainable focal fluid collection. There is no hematoma.  There is no focal marrow signal abnormality. There is no fracture or subluxation. No periosteal reaction.  IMPRESSION: 1. Severe muscle edema within the vastus medialis, vastus intermedius and sartorius muscles most consistent with myositis secondary to an infectious or inflammatory etiology. 14 mm more focal area of fluid signal within the vastus medialis which may reflect a more focal edema versus a small fluid collection. Low level edema within the  vastus lateralis and rectus femoris likely reflecting myositis to a much lesser degree.   Electronically Signed   By: Kathreen Devoid   On: 08/12/2014 08:57   Dg Femur, Min 2 Views Right  08/10/2014   CLINICAL DATA:  Right thigh pain. Concern for myositis. Rule out soft tissue air.  EXAM: RIGHT FEMUR 2 VIEWS  COMPARISON:  None.  FINDINGS: Cortical margins of the right femur are intact. No fracture, focal osseous lesion, erosion or periosteal reaction. No soft tissue air or radiopaque foreign body.  Questionable soft tissue edema proximally.  IMPRESSION: Suspect mild soft tissue edema proximally. No soft tissue air, radiopaque foreign body, or acute osseous abnormality.   Electronically Signed   By: Jeb Levering M.D.   On: 08/10/2014 01:48     CBC  Recent Labs Lab 08/09/14 1819 08/10/14 0128 08/11/14 0507 08/12/14 0538  WBC 22.6* 18.7* 16.9* 15.0*  HGB 9.5* 7.8* 7.4* 7.3*  HCT 28.5* 23.4* 23.0* 22.8*  PLT 308 242 250 276  MCV 82.6 83.0 84.2 83.8  MCH 27.5 27.7 27.1 26.8  MCHC 33.3 33.3 32.2 32.0  RDW 13.6 13.6 14.0 13.9  LYMPHSABS 1.5  --  1.0  --   MONOABS 1.5*  --  1.5*  --   EOSABS 0.0  --  0.1  --   BASOSABS 0.0  --  0.1  --     Chemistries   Recent Labs Lab 08/09/14 1819 08/09/14 2350 08/10/14 0128 08/11/14 0507 08/12/14 0538  NA 136  --  135 132* 131*  K 4.5  --  4.0 4.7 4.6  CL 105  --  103 105 102  CO2 22  --  21* 17* 15*  GLUCOSE 68  --  101* 176* 215*  BUN 41*  --  39* 42* 47*  CREATININE 5.46*  --  5.33* 5.39* 5.91*  CALCIUM 9.0  --  8.2* 8.5* 8.5*  MG  --  2.0  --   --   --   AST 28  --  38  --   --   ALT 15  --  18  --   --   ALKPHOS 127*  --  166*  --   --   BILITOT 0.5  --  0.6  --   --    ------------------------------------------------------------------------------------------------------------------ estimated creatinine clearance is 12.9 mL/min (by C-G formula based on Cr of 5.91). ------------------------------------------------------------------------------------------------------------------  Recent Labs  08/10/14 0128  HGBA1C 8.9*   ------------------------------------------------------------------------------------------------------------------ No results for input(s): CHOL, HDL, LDLCALC, TRIG, CHOLHDL, LDLDIRECT in the last 72 hours. ------------------------------------------------------------------------------------------------------------------  Recent Labs  08/10/14 0911  TSH 25.005*  T4TOTAL 6.3  T3FREE 1.7*    ------------------------------------------------------------------------------------------------------------------  Recent Labs  08/10/14 1220  VITAMINB12 545  FOLATE 12.8  FERRITIN 127  TIBC 179*  IRON 9*  RETICCTPCT 1.4    Coagulation profile  Recent Labs Lab 08/09/14 2350  INR 1.31    No results for input(s): DDIMER in the last 72 hours.  Cardiac Enzymes No results for input(s): CKMB, TROPONINI, MYOGLOBIN in the last 168 hours.  Invalid input(s): CK ------------------------------------------------------------------------------------------------------------------ Invalid input(s): POCBNP   Time Spent in minutes  35   SINGH,PRASHANT K M.D on 08/12/2014 at 10:51 AM  Between 7am to 7pm - Pager - [phone]  After 7pm go to [email or web address] - password [facility]  [name]   Office  [phone]

## 2014-08-12 NOTE — Progress Notes (Signed)
Inpatient Diabetes Program Recommendations  AACE/ADA: New Consensus Statement on Inpatient Glycemic Control (2013)  Target Ranges:  Prepandial:   less than 140 mg/dL      Peak postprandial:   less than 180 mg/dL (1-2 hours)      Critically ill patients:  140 - 180 mg/dL   Results for SEVYNN, MCCONNAUGHEY (MRN [id number]) as of 08/12/2014 10:16  Ref. Range 08/11/2014 11:57 08/11/2014 16:27 08/11/2014 20:17 08/12/2014 08:53  Glucose-Capillary Latest Ref Range: 65-99 mg/dL 160 (H) 177 (H) 134 (H) 226 (H)   Home meds:  Lantus 18 units q AM, Novolog 5 units tid with meals  Hospital meds:  Lantus 12 units q AM, Novolog sensitive tid with meals  May consider increasing Lantus to 16 units daily.    Thanks, Adah Perl, RN, BC-ADM Inpatient Diabetes Coordinator Pager [phone] (8a-5p)

## 2014-08-12 NOTE — Progress Notes (Signed)
Patient ID: Tracy [name], female   DOB: 1985-04-20, 29 y.o.   MRN: [id number] Patient seen and examined.  Patient remains afebrile.  She still has pain in her leg and arm.  I've examined the arm the arm is without significant change. I've prompted her to elevate massage and move the fingers more as it is in a dependent position.  I discussed with she and her family that she is afebrile and that her white count is trending down.  I also discussed with the family that certainly her MRIs not shown any change. If she had a significant infection I would anticipate that she would have some degree of worsening in my opinion. Typically an abscess will continue to the fall of as opposed to remaining static in this type of situation.  If her white count begins to move higher or if clinical conditions change I would be more than happy to perform a surgical look with biopsy and the patient and family are aware of this.  We will remaining very observant and continue close serial exams. I spent greater than 40 minutes face-to-face time with she and her family today discussing the issues and our plans.  MRI was reviewed at great length.  Rector Devonshire MD

## 2014-08-12 NOTE — Progress Notes (Signed)
Assessment/Plan: 1. Acute renal failure on chronic kidney disease stage IV: prob NSAID and hemodynamically induced.  Early uremic symptoms v NSAID gastropathy.  Will add Pepcid.  Have discussed poss need for HD. 2. Myositis  3. Anemia:  4. Hyponatremia: Secondary to acute renal failure/free water excretion defect  Subjective: Interval History: [name]/o nausea and malaise Objective: Vital signs in last 24 hours: Temp:  [98 F (36.7 [name])-99 F (37.2 [name])] 99 F (37.2 [name]) (07/01 0854) Pulse Rate:  [88-105] 99 (07/01 0854) Resp:  [18-20] 20 (07/01 0854) BP: (145-177)/(77-80) 150/79 mmHg (07/01 0854) SpO2:  [97 %-98 %] 97 % (07/01 0854) Weight change:   Intake/Output from previous day: 06/30 0701 - 07/01 0700 In: 4235 [P.O.:1380; I.V.:2255; IV Piggyback:600] Out: 1775 [Urine:1775] Intake/Output this shift:    Lungs clear Cor RRRR RLE edema 1-+ thigh area.  Lab Results:  Recent Labs  08/11/14 0507 08/12/14 0538  WBC 16.9* 15.0*  HGB 7.4* 7.3*  HCT 23.0* 22.8*  PLT 250 276   BMET:  Recent Labs  08/11/14 0507 08/12/14 0538  NA 132* 131*  K 4.7 4.6  CL 105 102  CO2 17* 15*  GLUCOSE 176* 215*  BUN 42* 47*  CREATININE 5.39* 5.91*  CALCIUM 8.5* 8.5*   No results for input(s): PTH in the last 72 hours. Iron Studies:  Recent Labs  08/10/14 1220  IRON 9*  TIBC 179*  FERRITIN 127   Studies/Results: Mr Humerus Right Wo Contrast  08/12/2014   CLINICAL DATA:  Myositis in the right arm.  Injury 2 weeks ago.  EXAM: MRI OF THE RIGHT HUMERUS WITHOUT CONTRAST  TECHNIQUE: Multiplanar, multisequence MR imaging was performed. No intravenous contrast was administered.  COMPARISON:  08/10/2014  FINDINGS: Dependent atelectasis in the right lower lobe.  There is considerable abnormal edema in the lateral, long, and medial head of the triceps diffusely. The internal tendon of the medial head seems torn on images 20 through 23 of series 7, with an associated 2.2 by 0.6 by 1.4 cm fluid  collection along the proximal margin of the tendon.  There is edema in the coracobrachialis muscle tendon a small medial portion of the brachialis muscle.  Left axillary lymph nodes measure up to 1.2 cm in short axis. Edema tracks along the axillary neurovascular structures.  Fluid signal tracks superficial to the posterior deltoid muscle and superficial to the latissimus dorsi and infraspinatus and teres minor muscles.  Subcutaneous edema tracks in the upper arm circumferentially, somewhat more confluent anteriorly.  Small elbow joint effusion.  No significant abnormal osseous edema.  IMPRESSION: 1. Again observed is extensive myositis primarily involving the triceps and coracobrachialis muscles, with a small intrasubstance fluid collection along the proximal tendinous margin of the medial head of the triceps which may represent musculotendinous tear or less likely a very small abscess. This collection is similar to prior. Possibilities include posttraumatic myositis, infection, or a vascular etiology. Mild reactive axillary adenopathy on the right may suggest an infectious component. 2. Subcutaneous edema in the upper arm, mildly increased. Cellulitis not excluded. 3. Edema tracks along the superficial margin of the deltoid muscle, teres minor, and infraspinatus as well as the latissimus dorsi. This may be incidental but shear injury cannot be excluded.   Electronically Signed   By: Van Clines M.D.   On: 08/12/2014 08:46   US Renal  08/10/2014   CLINICAL DATA:  Acute kidney injury.  Elevated creatinine level.  EXAM: RENAL / URINARY TRACT ULTRASOUND COMPLETE  COMPARISON:  CT examination 03/09/2013 and ultrasound 10/04/2008  FINDINGS: Right Kidney:  Length: 10.6 cm. The right kidney parenchyma is echogenic without hydronephrosis. There is a heterogeneous hypoechoic structure along the upper pole measuring up to 1.1 cm. Previously, the patient had an abscess in the right kidney and unclear if this sequela  from the abscess or a new small complex lesion.  Left Kidney:  Length: 10.6 cm. Left kidney is not well visualized but appears to be echogenic. Negative for hydronephrosis.  Bladder:  Appears normal for degree of bladder distention.  IMPRESSION: Negative for hydronephrosis.  Increased echogenicity of both kidneys. Findings raise concern for chronic medical renal disease.  There is a 1.1 cm heterogeneous hypoechoic structure in the right kidney upper pole. Unclear if this is the sequelae of previous renal abscess versus new small complex cystic lesion. Evaluation of this area will be difficult due to acute kidney injury and an elevated creatinine level. However, this may be better characterized with a non contrast MRI versus close surveillance with ultrasound.   Electronically Signed   By: Markus Daft M.D.   On: 08/10/2014 11:34   Mr Femur Right Wo Contrast  08/12/2014   CLINICAL DATA:  Myositis of the right arm and right thigh. Status post physical strain 2 weeks ago.  EXAM: MRI OF THE RIGHT FEMUR WITHOUT CONTRAST  TECHNIQUE: Multiplanar, multisequence MR imaging of the right femur was performed. No intravenous contrast was administered.  COMPARISON:  CT right femur 08/10/2014  FINDINGS: There is severe muscle edema within the vastus medialis and intermedius muscle. There is a 14 mm more focal area of fluid signal within the vastus medialis which may reflect a more focal edema versus a small fluid collection. There is mild muscle edema within the vastus lateralis and rectus femoris. There is severe muscle edema within the sartorius muscle. There is a small amount of fluid along the fascia of the flexor compartment. There is small amount of fluid along the fascia of the flexor compartment and around the adductor compartment. There is no drainable focal fluid collection. There is no hematoma.  There is no focal marrow signal abnormality. There is no fracture or subluxation. No periosteal reaction.  IMPRESSION: 1.  Severe muscle edema within the vastus medialis, vastus intermedius and sartorius muscles most consistent with myositis secondary to an infectious or inflammatory etiology. 14 mm more focal area of fluid signal within the vastus medialis which may reflect a more focal edema versus a small fluid collection. Low level edema within the vastus lateralis and rectus femoris likely reflecting myositis to a much lesser degree.   Electronically Signed   By: Kathreen Devoid   On: 08/12/2014 08:57    Scheduled: . amLODipine  10 mg Oral Daily  . aztreonam  1 g Intravenous Q8H  . famotidine  40 mg Oral BID  . heparin  5,000 Units Subcutaneous 3 times per day  . insulin aspart  0-9 Units Subcutaneous TID WC  . [START ON 08/13/2014] insulin glargine  16 Units Subcutaneous Daily  . iron dextran (INFED/DEXFERRUM) infusion  500 mg Intravenous Daily  . levothyroxine  125 mcg Oral QAC breakfast  . multivitamin with minerals  1 tablet Oral Daily  . sodium bicarbonate  1,300 mg Oral BID  . sodium chloride  1,000 mL Intravenous Once  . sodium chloride  3 mL Intravenous Q12H  . vancomycin  1,000 mg Intravenous Q48H    LOS: 3 days   Tracy [name] 08/12/2014,11:01 AM

## 2014-08-12 NOTE — Progress Notes (Signed)
Physical Therapy Treatment Patient Details Name: Tracy [name] MRN: [id number] DOB: Jul 07, 1985 Today's Date: 08/12/2014    History of Present Illness Myositis of the right arm and right thigh. Most likely post physical strain 2 weeks ago while throwing horse feed.    PT Comments    Pt continuing to progress with mobility though limited by endurance and pain in RLE/RUE. Currently S using IV pole for gait and transfers within the room. Encouraged continued OOB for increasing endurance and functional use of RUE and RLE. Continue PT POC to address impairments to prepare for d/c home   Follow Up Recommendations  Outpatient PT     Equipment Recommendations  Other (comment) (may benefit from [facility] if still limited WB on RLE)    Recommendations for Other Services       Precautions / Restrictions Precautions Precautions: None Restrictions Weight Bearing Restrictions: No    Mobility  Bed Mobility Overal bed mobility: Needs Assistance Bed Mobility: Supine to Sit     Supine to sit: Supervision;HOB elevated     General bed mobility comments: Pt able to use LLE to assist RLE off of bed. S for safety and encouraging cues  Transfers Overall transfer level: Needs assistance Equipment used:  (IV pole) Transfers: Sit to/from Stand Sit to Stand: Supervision         General transfer comment: S for safety no assist needed  Ambulation/Gait Ambulation/Gait assistance: Supervision Ambulation Distance (Feet): 110 Feet Assistive device:  (pushed IV pole for support) Gait Pattern/deviations: Antalgic;Decreased stance time - right     General Gait Details: antalgic gait pattern and using IV pole for support   Stairs            Wheelchair Mobility    Modified Rankin (Stroke Patients Only)       Balance                                    Cognition Arousal/Alertness: Awake/alert Behavior During Therapy: WFL for tasks assessed/performed Overall  Cognitive Status: Within Functional Limits for tasks assessed                      Exercises      General Comments General comments (skin integrity, edema, etc.): limited by pain RLE mainly; reports she been trying to move RUE more      Pertinent Vitals/Pain Pain Score: 7  Pain Location: R thigh Pain Descriptors / Indicators: Aching;Sore Pain Intervention(s): Monitored during session;Repositioned    Home Living                      Prior Function            PT Goals (current goals can now be found in the care plan section) Acute Rehab PT Goals PT Goal Formulation: With patient Time For Goal Achievement: 08/24/14 Potential to Achieve Goals: Good Progress towards PT goals: Progressing toward goals    Frequency  Min 3X/week    PT Plan Current plan remains appropriate    Co-evaluation             End of Session   Activity Tolerance: Patient tolerated treatment well;Patient limited by pain Patient left: in chair;with call bell/phone within reach;with family/visitor present     Time: [id number] PT Time Calculation (min) (ACUTE ONLY): 12 min  Charges:  $Gait Training: 8-22 mins  G Codes:      Canary Brim Ivory Broad, PT, DPT Pager #: [phone]  08/12/2014, 3:10 PM

## 2014-08-12 NOTE — Care Management Note (Signed)
Case Management Note  Patient Details  Name: Tracy [name] MRN: [id number] Date of Birth: Oct 20, 1985  Subjective/Objective:         CM following for progression and d/c planning.           Action/Plan:   08/12/2014  No d/c needs identified at this time.  Expected Discharge Date:       08/18/2014           Expected Discharge Plan:  Home/Self Care  In-House Referral:  NA  Discharge planning Services  NA  Post Acute Care Choice:  NA Choice offered to:  NA  DME Arranged:    DME Agency:     HH Arranged:    HH Agency:     Status of Service:  Completed, signed off  Medicare Important Message Given:    Date Medicare IM Given:    Medicare IM give by:    Date Additional Medicare IM Given:    Additional Medicare Important Message give by:     If discussed at [place] of Stay Meetings, dates discussed:    Additional Comments:  Adron Bene, RN 08/12/2014, 4:09 PM

## 2014-08-13 LAB — CBC WITH DIFFERENTIAL/PLATELET
Basophils Absolute: 0.1 10*3/uL (ref 0.0–0.1)
Basophils Relative: 0 % (ref 0–1)
EOS ABS: 0.1 10*3/uL (ref 0.0–0.7)
EOS PCT: 1 % (ref 0–5)
HCT: 22.3 % — ABNORMAL LOW (ref 36.0–46.0)
HEMOGLOBIN: 7.3 g/dL — AB (ref 12.0–15.0)
LYMPHS PCT: 6 % — AB (ref 12–46)
Lymphs Abs: 0.9 10*3/uL (ref 0.7–4.0)
MCH: 27.1 pg (ref 26.0–34.0)
MCHC: 32.7 g/dL (ref 30.0–36.0)
MCV: 82.9 fL (ref 78.0–100.0)
MONOS PCT: 9 % (ref 3–12)
Monocytes Absolute: 1.4 10*3/uL — ABNORMAL HIGH (ref 0.1–1.0)
Neutro Abs: 12.6 10*3/uL — ABNORMAL HIGH (ref 1.7–7.7)
Neutrophils Relative %: 84 % — ABNORMAL HIGH (ref 43–77)
PLATELETS: 291 10*3/uL (ref 150–400)
RBC: 2.69 MIL/uL — ABNORMAL LOW (ref 3.87–5.11)
RDW: 14 % (ref 11.5–15.5)
WBC: 15 10*3/uL — ABNORMAL HIGH (ref 4.0–10.5)

## 2014-08-13 LAB — GLUCOSE, CAPILLARY
GLUCOSE-CAPILLARY: 240 mg/dL — AB (ref 65–99)
GLUCOSE-CAPILLARY: 186 mg/dL — AB (ref 65–99)
GLUCOSE-CAPILLARY: 133 mg/dL — AB (ref 65–99)
GLUCOSE-CAPILLARY: 72 mg/dL (ref 65–99)

## 2014-08-13 LAB — BASIC METABOLIC PANEL
Anion gap: 16 — ABNORMAL HIGH (ref 5–15)
BUN: 54 mg/dL — AB (ref 6–20)
CHLORIDE: 102 mmol/L (ref 101–111)
CO2: 12 mmol/L — AB (ref 22–32)
CREATININE: 6.37 mg/dL — AB (ref 0.44–1.00)
Calcium: 8.4 mg/dL — ABNORMAL LOW (ref 8.9–10.3)
GFR, EST AFRICAN AMERICAN: 9 mL/min — AB (ref >60)
GFR, EST NON AFRICAN AMERICAN: 8 mL/min — AB (ref >60)
GLUCOSE: 223 mg/dL — AB (ref 65–99)
Potassium: 4.8 mmol/L (ref 3.5–5.1)
SODIUM: 130 mmol/L — AB (ref 135–145)

## 2014-08-13 LAB — TSH: TSH: 17.84 u[IU]/mL — ABNORMAL HIGH (ref 0.350–4.500)

## 2014-08-13 LAB — CK: CK TOTAL: 189 U/L (ref 38–234)

## 2014-08-13 LAB — ALDOLASE: Aldolase: 10.4 U/L — ABNORMAL HIGH (ref 3.3–10.3)

## 2014-08-13 LAB — PHOSPHORUS: Phosphorus: 6.4 mg/dL — ABNORMAL HIGH (ref 2.5–4.6)

## 2014-08-13 MED ORDER — SODIUM CHLORIDE 0.9 % IV SOLN
INTRAVENOUS | Status: AC
  Administered 2014-08-13 – 2014-08-15 (×3): via INTRAVENOUS

## 2014-08-13 MED ORDER — SODIUM CHLORIDE 0.9 % IV SOLN
510.0000 mg | Freq: Once | INTRAVENOUS | Status: AC
  Administered 2014-08-13: 510 mg via INTRAVENOUS
  Filled 2014-08-13: qty 17

## 2014-08-13 MED ORDER — METOPROLOL TARTRATE 25 MG PO TABS
25.0000 mg | ORAL_TABLET | Freq: Two times a day (BID) | ORAL | Status: DC
  Administered 2014-08-13 – 2014-08-14 (×3): 25 mg via ORAL
  Filled 2014-08-13 (×4): qty 1

## 2014-08-13 MED ORDER — INSULIN GLARGINE 100 UNIT/ML ~~LOC~~ SOLN
25.0000 [IU] | Freq: Every day | SUBCUTANEOUS | Status: DC
  Administered 2014-08-13 – 2014-08-14 (×2): 25 [IU] via SUBCUTANEOUS
  Filled 2014-08-13 (×3): qty 0.25

## 2014-08-13 MED ORDER — INSULIN ASPART 100 UNIT/ML ~~LOC~~ SOLN
0.0000 [IU] | Freq: Three times a day (TID) | SUBCUTANEOUS | Status: DC
  Administered 2014-08-13: 3 [IU] via SUBCUTANEOUS
  Administered 2014-08-13: 2 [IU] via SUBCUTANEOUS

## 2014-08-13 MED ORDER — INSULIN ASPART 100 UNIT/ML ~~LOC~~ SOLN: 0.0000 [IU] | Freq: Every day | SUBCUTANEOUS | Status: DC

## 2014-08-13 NOTE — Progress Notes (Signed)
[facility] Progress Note  Subjective  Overall doing better this am Nauseated Did get up and used IV pole for balance    Review of Systems  Constitutional: Negative for fever and chills.  Respiratory: Negative for shortness of breath and wheezing.   Cardiovascular: Negative for chest pain and palpitations.  Neurological: Negative for tingling and sensory change.     Objective   BP 153/80 mmHg  Pulse 104  Temp(Src) 98.8 F (37.1 C) (Oral)  Resp 14  Ht 5\' 3"  (1.6 m)  Wt 68 kg (149 lb 14.6 oz)  BMI 26.56 kg/m2  SpO2 95%  LMP 07/18/2014  Intake/Output      07/01 0701 - 07/02 0700 07/02 0701 - 07/03 0700   P.O. 720    I.V. (mL/kg) 2400 (35.3)    IV Piggyback 350    Total Intake(mL/kg) 3470 (51)    Urine (mL/kg/hr) 450 (0.3)    Stool 0 (0)    Total Output 450     Net +3020          Urine Occurrence 3 x    Stool Occurrence 0 x      Labs  Results for Tracy [name], Tracy [name] (MRN [id number]) as of 08/13/2014 11:42  Ref. Range 08/13/2014 05:40  WBC Latest Ref Range: 4.0-10.5 K/uL 15.0 (H)  RBC Latest Ref Range: 3.87-5.11 MIL/uL 2.69 (L)  Hemoglobin Latest Ref Range: 12.0-15.0 g/dL 7.3 (L)  HCT Latest Ref Range: 36.0-46.0 % 22.3 (L)  MCV Latest Ref Range: 78.0-100.0 fL 82.9  MCH Latest Ref Range: 26.0-34.0 pg 27.1  MCHC Latest Ref Range: 30.0-36.0 g/dL 32.7  RDW Latest Ref Range: 11.5-15.5 % 14.0  Platelets Latest Ref Range: 150-400 K/uL 291  Neutrophils Latest Ref Range: 43-77 % 84 (H)  Lymphocytes Latest Ref Range: 12-46 % 6 (L)  Monocytes Relative Latest Ref Range: 3-12 % 9  Eosinophil Latest Ref Range: 0-5 % 1  Basophil Latest Ref Range: 0-1 % 0  NEUT# Latest Ref Range: 1.7-7.7 K/uL 12.6 (H)  Lymphocyte # Latest Ref Range: 0.7-4.0 K/uL 0.9  Monocyte # Latest Ref Range: 0.1-1.0 K/uL 1.4 (H)  Eosinophils Absolute Latest Ref Range: 0.0-0.7 K/uL 0.1  Basophils Absolute Latest Ref Range: 0.0-0.1 K/uL 0.1   CBG (last 3)   Recent Labs  08/12/14 1657  08/12/14 2132 08/13/14 0734  GLUCAP 154* 153* 240*    Urine culture  Status: Final result     Visible to patient:  Not Released     Next appt: None              Newer results are available. Click to view them now.        4d ago     Specimen Description URINE, CLEAN CATCH    Special Requests NONE    Culture >=100,000 COLONIES/mL LACTOBACILLUS SPECIES    Report Status 08/11/2014 FINAL    Resulting Agency [facility]        Specimen Collected: 08/09/14 1:57 PM Last Resulted: 08/11/14  3:03 PM            Exam  Gen: resting in bed, NAD  Ext:       Right Lower Extremity   No significant redness  No areas of fluctuance   Distal motor and sensory functions intact  Ext warm              + DP pulse   Comparments unremarkable         Right Upper Extremity   Limited  elbow motion   No erythema  No fluctuance  Distal motor and sensory functions grossly intact  Ext warm   + radial pulse   Assessment and Plan   POD/HD#: 4  1. Myositis R leg and R arm   Clinically improving from ortho standpoint, albeit slowly  Continue with current care  WBAT   ROM as tolerated  Please call if there is worsening   ANA negative   2. Lactobacillus on urine culture  Discussed with primary team  Defer further management to them  3. ID  Continue IV abx  4. Medical issues  Per primary team and nephrology    5. dispo  Per medicine   Call with questions   Jari Pigg, PA-C [facility] [phone] (O) 08/13/2014 11:32 AM

## 2014-08-13 NOTE — Progress Notes (Signed)
ANTIBIOTIC CONSULT NOTE - FOLLOW UP  Pharmacy Consult for Vancomycin/Aztreonam Indication: r/o infection in R arm/thigh  Allergies  Allergen Reactions  . Penicillins Hives and Swelling  . Sulfa Antibiotics Hives    Patient Measurements: Height: 5\' 3"  (160 cm) Weight: 149 lb 14.6 oz (68 kg) IBW/kg (Calculated) : 52.4 Adjusted Body Weight:    Vital Signs: Temp: 98.8 F (37.1 C) (07/02 0508) Temp Source: Oral (07/02 0508) BP: 153/80 mmHg (07/02 0508) Pulse Rate: 104 (07/02 0508) Intake/Output from previous day: 07/01 0701 - 07/02 0700 In: [id number] [P.O.:720; I.V.:2400; IV Piggyback:350] Out: 450 [Urine:450] Intake/Output from this shift:    Labs:  Recent Labs  08/11/14 0507 08/11/14 1621 08/12/14 0538 08/13/14 0342 08/13/14 0540  WBC 16.9*  --  15.0*  --  15.0*  HGB 7.4*  --  7.3*  --  7.3*  PLT 250  --  276  --  291  LABCREA  --  118.84  --   --   --   CREATININE 5.39*  --  5.91* 6.37*  --    Estimated Creatinine Clearance: 11.9 mL/min (by C-G formula based on Cr of 6.37). No results for input(s): VANCOTROUGH, VANCOPEAK, VANCORANDOM, GENTTROUGH, GENTPEAK, GENTRANDOM, TOBRATROUGH, TOBRAPEAK, TOBRARND, AMIKACINPEAK, AMIKACINTROU, AMIKACIN in the last 72 hours.    Assessment: 29yo female with history of hypothyroidism, DM, anemia, and HTN presents from [facility] with myositis of R arm and R thigh. Pharmacy is consulted to dose vancomycin and aztreonam for myonecrosis. At admit pt is afebrile, WBC 22.6, sCr 5.5 (up from baseline in ~2). - Poor DM and Synthroid compliance PTA  ID: Abx for myositis/myonecrosis or RUE and RLE, WBC 22.6 > 16.9>15, pCT 0.58, LA wnl. Infection not yet ruled out. Day #5 abx.  6/28 vanc (6/28, 6/30,7/2, 7/4)> 6/28 aztreonam >  6/28 urine cx: >100,000 lactobacillus 6/28 blood cx: pending   Goal of Therapy:  Vancomycin trough level 10-15 mcg/ml  Plan:  Vancomycin 1g IV q48h--Today is dose #3. Check level 7/4 Aztreonam 1g IV  q8h  Toryn Dewalt S. Alford Highland, PharmD, BCPS Clinical Staff Pharmacist Pager [phone]  Eilene Ghazi Stillinger 08/13/2014,1:08 PM

## 2014-08-13 NOTE — ED Provider Notes (Signed)
CSN: [id number]     Arrival date & time 08/09/14  1754 History   First MD Initiated Contact with Patient 08/09/14 1849     Chief Complaint  Patient presents with  . Arm Pain     (Consider location/radiation/quality/duration/timing/severity/associated sxs/prior Treatment) HPI Patient presents to the emergency department with arm pain started last week.  The patient states she was seen at [facility] for this and referred to orthopedics.  The patient states that she had an MRI done today and was told to come to the emergency department by her primary care doctor.  Patient states that she has had no fevers, nausea, vomiting, weakness, dizziness, headache, blurred vision, back pain, neck pain, abdominal pain, chest pain, shortness of breath or syncope.  She states she has severe pain in her posterior arm and now she has developed pain in her thigh as well Past Medical History  Diagnosis Date  . Thyroid enlargement     "not on medication at this time"  . Urinary tract infection     hx of  . Anemia     presently on iron supplement  . Diabetes mellitus     insulin pump   . Hypothyroidism   . Anxiety   . HSV-2 (herpes simplex virus 2) infection   . HSV-1 (herpes simplex virus 1) infection   . Detached retina   . Yeast infection     took diflucan saturday   . Hypertension   . Irregular periods 07/05/2014  . Vaginal discharge 07/05/2014   Past Surgical History  Procedure Laterality Date  . Cholecystectomy    . Pilonidal cyst excision    . Wisdom tooth extraction    . Pars plana vitrectomy  10/01/2011    Procedure: PARS PLANA VITRECTOMY WITH 25 GAUGE;  Surgeon: Hayden Pedro, MD;  Location: [facility];  Service: Ophthalmology;  Laterality: Right;  Repair Complex Traction Retinal Detachment  . Trigger finger release Right 09/21/13   Family History  Problem Relation Age of Onset  . Cancer Paternal Grandfather     prostate  . Hyperlipidemia Paternal Grandfather   . Stroke Paternal  Grandfather    History  Substance Use Topics  . Smoking status: Never Smoker   . Smokeless tobacco: Never Used  . Alcohol Use: No   OB History    Gravida Para Term Preterm AB TAB SAB Ectopic Multiple Living   2    2  2         Review of Systems All other systems negative except as documented in the HPI. All pertinent positives and negatives as reviewed in the HPI.   Allergies  Penicillins and Sulfa antibiotics  Home Medications   Prior to Admission medications   Medication Sig Start Date End Date Taking? Authorizing Provider  acyclovir (ZOVIRAX) 400 MG tablet Take 400 mg by mouth 2 (two) times daily as needed. Fever blisters   Yes Historical Provider, MD  amLODipine (NORVASC) 10 MG tablet Take 10 mg by mouth daily.   Yes Historical Provider, MD  furosemide (LASIX) 20 MG tablet Take 20 mg by mouth 2 (two) times daily as needed for fluid.    Yes Historical Provider, MD  HYDROcodone-acetaminophen (NORCO/VICODIN) 5-325 MG per tablet Take 1 tablet by mouth every 4 (four) hours as needed for moderate pain. 08/08/14  Yes Carole Civil, MD  ibuprofen (ADVIL,MOTRIN) 800 MG tablet Take 1 tablet (800 mg total) by mouth 3 (three) times daily. 08/05/14  Yes Orpah Greek, MD  insulin aspart (NOVOLOG FLEXPEN) 100 UNIT/ML FlexPen Inject 5 Units into the skin 3 (three) times daily with meals.    Yes Historical Provider, MD  Insulin Glargine (LANTUS SOLOSTAR) 100 UNIT/ML SOPN Inject 28 Units into the skin at bedtime. Patient taking differently: Inject 15 Units into the skin every morning.  01/22/13  Yes Samuella Cota, MD  levothyroxine (SYNTHROID, LEVOTHROID) 88 MCG tablet Take 1 tablet (88 mcg total) by mouth daily before breakfast. 01/25/14  Yes Verlee Monte, MD  Multiple Vitamins-Minerals (MULTI-VITAMIN GUMMIES PO) Take by mouth daily.   Yes Historical Provider, MD  promethazine (PHENERGAN) 12.5 MG tablet Take 1 tablet (12.5 mg total) by mouth every 4 (four) hours as needed for  nausea or vomiting. 08/08/14  Yes Carole Civil, MD  fluconazole (DIFLUCAN) 150 MG tablet Take 1 now and 1 in 3 days Patient not taking: Reported on 08/09/2014 07/05/14   Estill Dooms, NP   BP 153/80 mmHg  Pulse 104  Temp(Src) 98.8 F (37.1 C) (Oral)  Resp 14  Ht 5\' 3"  (1.6 m)  Wt 149 lb 14.6 oz (68 kg)  BMI 26.56 kg/m2  SpO2 95%  LMP 07/18/2014 Physical Exam  Constitutional: She is oriented to person, place, and time. She appears well-developed and well-nourished. No distress.  HENT:  Head: Normocephalic and atraumatic.  Mouth/Throat: Oropharynx is clear and moist.  Eyes: Pupils are equal, round, and reactive to light.  Neck: Normal range of motion. Neck supple.  Cardiovascular: Normal rate and regular rhythm.  Exam reveals no gallop and no friction rub.   No murmur heard. Pulmonary/Chest: Effort normal and breath sounds normal. No respiratory distress.  Musculoskeletal:       Arms:      Legs: Neurological: She is alert and oriented to person, place, and time. She exhibits normal muscle tone. Coordination normal.  Skin: Skin is warm and dry. No rash noted. No erythema.  Nursing note and vitals reviewed.   ED Course  Procedures (including critical care time) Labs Review Labs Reviewed  COMPREHENSIVE METABOLIC PANEL - Abnormal; Notable for the following:    BUN 41 (*)    Creatinine, Ser 5.46 (*)    Albumin 2.9 (*)    Alkaline Phosphatase 127 (*)    GFR calc non Af Amer 10 (*)    GFR calc Af Amer 11 (*)    All other components within normal limits  CBC WITH DIFFERENTIAL/PLATELET - Abnormal; Notable for the following:    WBC 22.6 (*)    RBC 3.45 (*)    Hemoglobin 9.5 (*)    HCT 28.5 (*)    Neutrophils Relative % 86 (*)    Neutro Abs 19.5 (*)    Lymphocytes Relative 7 (*)    Monocytes Absolute 1.5 (*)    All other components within normal limits  URINALYSIS, ROUTINE W REFLEX MICROSCOPIC (NOT AT [facility]) - Abnormal; Notable for the following:    APPearance CLOUDY  (*)    Glucose, UA 100 (*)    Hgb urine dipstick MODERATE (*)    Protein, ur >300 (*)    All other components within normal limits  CK - Abnormal; Notable for the following:    Total CK 257 (*)    All other components within normal limits  URINE MICROSCOPIC-ADD ON - Abnormal; Notable for the following:    Squamous Epithelial / LPF MANY (*)    Casts GRANULAR CAST (*)    All other components within normal limits  SEDIMENTATION RATE -  Abnormal; Notable for the following:    Sed Rate 112 (*)    All other components within normal limits  C-REACTIVE PROTEIN - Abnormal; Notable for the following:    CRP 28.5 (*)    All other components within normal limits  HEMOGLOBIN A1C - Abnormal; Notable for the following:    Hgb A1c MFr Bld 8.9 (*)    All other components within normal limits  TSH - Abnormal; Notable for the following:    TSH 25.942 (*)    All other components within normal limits  PROTIME-INR - Abnormal; Notable for the following:    Prothrombin Time 16.4 (*)    All other components within normal limits  APTT - Abnormal; Notable for the following:    aPTT 42 (*)    All other components within normal limits  URINE RAPID DRUG SCREEN, HOSP PERFORMED - Abnormal; Notable for the following:    Opiates POSITIVE (*)    All other components within normal limits  PHOSPHORUS - Abnormal; Notable for the following:    Phosphorus 4.8 (*)    All other components within normal limits  COMPREHENSIVE METABOLIC PANEL - Abnormal; Notable for the following:    CO2 21 (*)    Glucose, Bld 101 (*)    BUN 39 (*)    Creatinine, Ser 5.33 (*)    Calcium 8.2 (*)    Total Protein 5.7 (*)    Albumin 2.2 (*)    Alkaline Phosphatase 166 (*)    GFR calc non Af Amer 10 (*)    GFR calc Af Amer 11 (*)    All other components within normal limits  CBC - Abnormal; Notable for the following:    WBC 18.7 (*)    RBC 2.82 (*)    Hemoglobin 7.8 (*)    HCT 23.4 (*)    All other components within normal limits   TSH - Abnormal; Notable for the following:    TSH 25.005 (*)    All other components within normal limits  CK - Abnormal; Notable for the following:    Total CK 298 (*)    All other components within normal limits  T3, FREE - Abnormal; Notable for the following:    T3, Free 1.7 (*)    All other components within normal limits  GLUCOSE, CAPILLARY - Abnormal; Notable for the following:    Glucose-Capillary 153 (*)    All other components within normal limits  ALDOLASE - Abnormal; Notable for the following:    Aldolase 11.1 (*)    All other components within normal limits  IRON AND TIBC - Abnormal; Notable for the following:    Iron 9 (*)    TIBC 179 (*)    Saturation Ratios 5 (*)    All other components within normal limits  RETICULOCYTES - Abnormal; Notable for the following:    RBC. 2.92 (*)    All other components within normal limits  GLUCOSE, CAPILLARY - Abnormal; Notable for the following:    Glucose-Capillary 119 (*)    All other components within normal limits  GLUCOSE, CAPILLARY - Abnormal; Notable for the following:    Glucose-Capillary 147 (*)    All other components within normal limits  CBC WITH DIFFERENTIAL/PLATELET - Abnormal; Notable for the following:    WBC 16.9 (*)    RBC 2.73 (*)    Hemoglobin 7.4 (*)    HCT 23.0 (*)    Neutrophils Relative % 84 (*)    Neutro Abs  14.2 (*)    Lymphocytes Relative 6 (*)    Monocytes Absolute 1.5 (*)    All other components within normal limits  BASIC METABOLIC PANEL - Abnormal; Notable for the following:    Sodium 132 (*)    CO2 17 (*)    Glucose, Bld 176 (*)    BUN 42 (*)    Creatinine, Ser 5.39 (*)    Calcium 8.5 (*)    GFR calc non Af Amer 10 (*)    GFR calc Af Amer 11 (*)    All other components within normal limits  GLUCOSE, CAPILLARY - Abnormal; Notable for the following:    Glucose-Capillary 126 (*)    All other components within normal limits  GLUCOSE, CAPILLARY - Abnormal; Notable for the following:     Glucose-Capillary 188 (*)    All other components within normal limits  GLUCOSE, CAPILLARY - Abnormal; Notable for the following:    Glucose-Capillary 160 (*)    All other components within normal limits  URINALYSIS, ROUTINE W REFLEX MICROSCOPIC (NOT AT [facility]) - Abnormal; Notable for the following:    APPearance TURBID (*)    Glucose, UA 250 (*)    Hgb urine dipstick SMALL (*)    Bilirubin Urine SMALL (*)    Ketones, ur 15 (*)    Protein, ur >300 (*)    All other components within normal limits  GLUCOSE, CAPILLARY - Abnormal; Notable for the following:    Glucose-Capillary 177 (*)    All other components within normal limits  CBC - Abnormal; Notable for the following:    WBC 15.0 (*)    RBC 2.72 (*)    Hemoglobin 7.3 (*)    HCT 22.8 (*)    All other components within normal limits  URINE MICROSCOPIC-ADD ON - Abnormal; Notable for the following:    Squamous Epithelial / LPF FEW (*)    All other components within normal limits  GLUCOSE, CAPILLARY - Abnormal; Notable for the following:    Glucose-Capillary 134 (*)    All other components within normal limits  RENAL FUNCTION PANEL - Abnormal; Notable for the following:    Sodium 131 (*)    CO2 15 (*)    Glucose, Bld 215 (*)    BUN 47 (*)    Creatinine, Ser 5.91 (*)    Calcium 8.5 (*)    Phosphorus 6.0 (*)    Albumin 1.8 (*)    GFR calc non Af Amer 9 (*)    GFR calc Af Amer 10 (*)    All other components within normal limits  GLUCOSE, CAPILLARY - Abnormal; Notable for the following:    Glucose-Capillary 226 (*)    All other components within normal limits  GLUCOSE, CAPILLARY - Abnormal; Notable for the following:    Glucose-Capillary 185 (*)    All other components within normal limits  GLUCOSE, CAPILLARY - Abnormal; Notable for the following:    Glucose-Capillary 154 (*)    All other components within normal limits  BASIC METABOLIC PANEL - Abnormal; Notable for the following:    Sodium 130 (*)    CO2 12 (*)    Glucose,  Bld 223 (*)    BUN 54 (*)    Creatinine, Ser 6.37 (*)    Calcium 8.4 (*)    GFR calc non Af Amer 8 (*)    GFR calc Af Amer 9 (*)    Anion gap 16 (*)    All other components within normal  limits  TSH - Abnormal; Notable for the following:    TSH 17.840 (*)    All other components within normal limits  PHOSPHORUS - Abnormal; Notable for the following:    Phosphorus 6.4 (*)    All other components within normal limits  GLUCOSE, CAPILLARY - Abnormal; Notable for the following:    Glucose-Capillary 153 (*)    All other components within normal limits  CBC WITH DIFFERENTIAL/PLATELET - Abnormal; Notable for the following:    WBC 15.0 (*)    RBC 2.69 (*)    Hemoglobin 7.3 (*)    HCT 22.3 (*)    Neutrophils Relative % 84 (*)    Neutro Abs 12.6 (*)    Lymphocytes Relative 6 (*)    Monocytes Absolute 1.4 (*)    All other components within normal limits  GLUCOSE, CAPILLARY - Abnormal; Notable for the following:    Glucose-Capillary 240 (*)    All other components within normal limits  URINE CULTURE  CULTURE, BLOOD (ROUTINE X 2)  CULTURE, BLOOD (ROUTINE X 2)  CREATININE, URINE, RANDOM  UREA NITROGEN, URINE  LACTIC ACID, PLASMA  LACTIC ACID, PLASMA  PROCALCITONIN  HIV ANTIBODY (ROUTINE TESTING)  MAGNESIUM  GLUCOSE, CAPILLARY  T4, FREE  T4  VITAMIN B12  FOLATE  FERRITIN  ANTINUCLEAR ANTIBODIES, IFA  CREATININE, URINE, RANDOM  SODIUM, URINE, RANDOM  CK  CK  ALDOLASE  CBC WITH DIFFERENTIAL/PLATELET  I-STAT CG4 LACTIC ACID, ED  I-STAT CG4 LACTIC ACID, ED  TYPE AND SCREEN  ABO/RH    Imaging Review Mr Humerus Right Wo Contrast  08/12/2014   CLINICAL DATA:  Myositis in the right arm.  Injury 2 weeks ago.  EXAM: MRI OF THE RIGHT HUMERUS WITHOUT CONTRAST  TECHNIQUE: Multiplanar, multisequence MR imaging was performed. No intravenous contrast was administered.  COMPARISON:  08/10/2014  FINDINGS: Dependent atelectasis in the right lower lobe.  There is considerable abnormal edema  in the lateral, long, and medial head of the triceps diffusely. The internal tendon of the medial head seems torn on images 20 through 23 of series 7, with an associated 2.2 by 0.6 by 1.4 cm fluid collection along the proximal margin of the tendon.  There is edema in the coracobrachialis muscle tendon a small medial portion of the brachialis muscle.  Left axillary lymph nodes measure up to 1.2 cm in short axis. Edema tracks along the axillary neurovascular structures.  Fluid signal tracks superficial to the posterior deltoid muscle and superficial to the latissimus dorsi and infraspinatus and teres minor muscles.  Subcutaneous edema tracks in the upper arm circumferentially, somewhat more confluent anteriorly.  Small elbow joint effusion.  No significant abnormal osseous edema.  IMPRESSION: 1. Again observed is extensive myositis primarily involving the triceps and coracobrachialis muscles, with a small intrasubstance fluid collection along the proximal tendinous margin of the medial head of the triceps which may represent musculotendinous tear or less likely a very small abscess. This collection is similar to prior. Possibilities include posttraumatic myositis, infection, or a vascular etiology. Mild reactive axillary adenopathy on the right may suggest an infectious component. 2. Subcutaneous edema in the upper arm, mildly increased. Cellulitis not excluded. 3. Edema tracks along the superficial margin of the deltoid muscle, teres minor, and infraspinatus as well as the latissimus dorsi. This may be incidental but shear injury cannot be excluded.   Electronically Signed   By: Van Clines M.D.   On: 08/12/2014 08:46   Mr Femur Right Wo Contrast  08/12/2014  CLINICAL DATA:  Myositis of the right arm and right thigh. Status post physical strain 2 weeks ago.  EXAM: MRI OF THE RIGHT FEMUR WITHOUT CONTRAST  TECHNIQUE: Multiplanar, multisequence MR imaging of the right femur was performed. No intravenous  contrast was administered.  COMPARISON:  CT right femur 08/10/2014  FINDINGS: There is severe muscle edema within the vastus medialis and intermedius muscle. There is a 14 mm more focal area of fluid signal within the vastus medialis which may reflect a more focal edema versus a small fluid collection. There is mild muscle edema within the vastus lateralis and rectus femoris. There is severe muscle edema within the sartorius muscle. There is a small amount of fluid along the fascia of the flexor compartment. There is small amount of fluid along the fascia of the flexor compartment and around the adductor compartment. There is no drainable focal fluid collection. There is no hematoma.  There is no focal marrow signal abnormality. There is no fracture or subluxation. No periosteal reaction.  IMPRESSION: 1. Severe muscle edema within the vastus medialis, vastus intermedius and sartorius muscles most consistent with myositis secondary to an infectious or inflammatory etiology. 14 mm more focal area of fluid signal within the vastus medialis which may reflect a more focal edema versus a small fluid collection. Low level edema within the vastus lateralis and rectus femoris likely reflecting myositis to a much lesser degree.   Electronically Signed   By: Kathreen Devoid   On: 08/12/2014 08:57    The patient will be admitted to the hospital for further evaluation and care.  Spoke with the [facility] and hand surgery.  Patient is made aware of the plan and all questions were answered   Dalia Heading, PA-C 08/14/14 [place], MD 08/15/14 [phone]

## 2014-08-13 NOTE — Progress Notes (Addendum)
Patient Demographics:    Tracy [name], is a 29 y.o. female, DOB - 1985-11-27, [id number]  Admit date - 08/09/2014   Admitting Physician Ivor Costa, MD  Outpatient Primary MD for the patient is [name]  LOS - 4   Chief Complaint  Patient presents with  . Arm Pain        Subjective:    Phyillis Kirst today has, No headache, No chest pain, No abdominal pain - No Nausea, No new weakness tingling or numbness, No Cough - SOB. Continues to have right arm and right thigh pain but feels some better everyday.   Assessment  & Plan :     1. Myositis of the right arm and right thigh. Most likely post physical strain 2 weeks ago while throwing horse feed. Case discussed with rheumatologist Dr. Amil Amen over the phone, ANA is negative, aldolase is mildly elevated and will be repeated to follow trend, CK mildly elevated. For now supportive care only, general surgery, hand surgery and orthopedics both on board.   Will continue IV anti-biotics as infection has not completely been ruled out. Ultrasound of the right upper arm does not show a clot. He shouldn't says that she continues to feel mild improvement in the last few days, MRI shows consistent myositis in both right-sided extremities, we'll defer any surgical intervention to hand surgery and orthopedic surgery.   In my opinion her myositis is likely initiated by recent physical stress of moving the horse feed predisposed and worsened by uncontrolled hypothyroidism and diabetes. Clinically she has shown improvement over the last few days and continues to feel better. Continue supportive care.      2. Hypothyroidism. Was not taking Synthroid for the last couple of months, counseled on compliance, TSH was 25, Synthroid resumed monitorinf TSH is  gradually improving.    3. Anemia - iron deficiency on into anemia panel, IV and replaced. Monitor H&H. Transfuse if hemoglobin drops below 7. Outpatient workup for iron deficiency anemia in a menstruating female as appropriate.    4.ARF of CKD 4 - baseline creatinine of 3, had some nausea vomiting and NSAID use prior to admission, likely ATN. Avoid nephrotoxins, continue hydration, renal ultrasound shows cyst but no obstruction, urine output stable, Renal following.     5. DM1 - poor outpatient control with A1c of 8.9 , on Lantus dose adjusted again along with her ISS,  monitor CBGs.  CBG (last 3)   Recent Labs  08/12/14 1657 08/12/14 2132 08/13/14 0734  GLUCAP 154* 153* 240*    Lab Results  Component Value Date   HGBA1C 8.9* 08/10/2014     6. UA appears clean with 0-2 WBCs ruling out UTI, cultures growing lactobacilli which likely is contamination from vagina flora. Monitor clinically.     Code Status : Full  Family Communication  : Mother bedside, boyfriend bedside  Disposition Plan  : Home   Consults  :  Orthopedics, hand surgery, general surgery, Renal  Procedures  : Right upper extremity venous duplex. No DVT, MRI right arm and right leg, CT right thigh  DVT Prophylaxis  :   Heparin    Lab Results  Component Value Date   PLT 291 08/13/2014    Inpatient Medications  Scheduled  Meds: . amLODipine  10 mg Oral Daily  . aztreonam  1 g Intravenous Q8H  . famotidine  40 mg Oral BID  . heparin  5,000 Units Subcutaneous 3 times per day  . insulin aspart  0-15 Units Subcutaneous TID WC  . insulin aspart  0-5 Units Subcutaneous QHS  . insulin glargine  25 Units Subcutaneous Daily  . levothyroxine  125 mcg Oral QAC breakfast  . multivitamin with minerals  1 tablet Oral Daily  . sodium bicarbonate  1,300 mg Oral BID  . sodium chloride  1,000 mL Intravenous Once  . sodium chloride  3 mL Intravenous Q12H  . vancomycin  1,000 mg Intravenous Q48H   Continuous  Infusions: . sodium chloride 100 mL/hr at 08/13/14 0822   PRN Meds:.acetaminophen **OR** acetaminophen, alum & mag hydroxide-simeth, hydrALAZINE, HYDROcodone-acetaminophen, HYDROmorphone (DILAUDID) injection, ondansetron, zolpidem  Antibiotics  :     Anti-infectives    Start     Dose/Rate Route Frequency Ordered Stop   08/11/14 2200  vancomycin (VANCOCIN) IVPB 1000 mg/200 mL premix     1,000 mg 200 mL/hr over 60 Minutes Intravenous Every 48 hours 08/09/14 2256     08/10/14 1045  vancomycin (VANCOCIN) IVPB 750 mg/150 ml premix  Status:  Discontinued     750 mg 150 mL/hr over 60 Minutes Intravenous Every 12 hours 08/09/14 2252 08/09/14 2256   08/09/14 2230  aztreonam (AZACTAM) 1 g in dextrose 5 % 50 mL IVPB     1 g 100 mL/hr over 30 Minutes Intravenous Every 8 hours 08/09/14 2215     08/09/14 2200  vancomycin (VANCOCIN) IVPB 1000 mg/200 mL premix  Status:  Discontinued     1,000 mg 200 mL/hr over 60 Minutes Intravenous Every 48 hours 08/09/14 2159 08/09/14 2252   08/09/14 2145  vancomycin (VANCOCIN) IVPB 1000 mg/200 mL premix  Status:  Discontinued     1,000 mg 200 mL/hr over 60 Minutes Intravenous  Once 08/09/14 2137 08/09/14 2147        Objective:   Filed Vitals:   08/12/14 1701 08/12/14 2135 08/12/14 2140 08/13/14 0508  BP: 160/85 151/69  153/80  Pulse: 102 102  104  Temp: 98.1 F (36.7 C) 99.7 F (37.6 C)  98.8 F (37.1 C)  TempSrc: Oral Oral  Oral  Resp: 20 18  14   Height:      Weight:   68 kg (149 lb 14.6 oz)   SpO2: 98% 98%  95%    Wt Readings from Last 3 Encounters:  08/12/14 68 kg (149 lb 14.6 oz)  08/08/14 68.493 kg (151 lb)  08/05/14 68.493 kg (151 lb)     Intake/Output Summary (Last 24 hours) at 08/13/14 0953 Last data filed at 08/13/14 0600  Gross per 24 hour  Intake   3470 ml  Output    450 ml  Net   3020 ml     Physical Exam  Awake Alert, Oriented X 3, No new F.N deficits, Normal affect [place].AT,PERRAL Supple Neck,No JVD, No cervical  lymphadenopathy appriciated.  Symmetrical Chest wall movement, Good air movement bilaterally, CTAB RRR,No Gallops,Rubs or new Murmurs, No Parasternal Heave +ve B.Sounds, Abd Soft, No tenderness, No organomegaly appriciated, No rebound - guarding or rigidity. No Cyanosis, Clubbing or edema, No new Rash or bruise  Swelling in the right arm and right thigh, some tenderness, no warmth, no signs of compartment syndrome    Data Review:   Micro Results Recent Results (from the past 240 hour(s))  Urine  culture     Status: None   Collection Time: 08/09/14  1:57 PM  Result Value Ref Range Status   Specimen Description URINE, CLEAN CATCH  Final   Special Requests NONE  Final   Culture >=100,000 COLONIES/mL LACTOBACILLUS SPECIES  Final   Report Status 08/11/2014 FINAL  Final  Culture, blood (x 2)     Status: None (Preliminary result)   Collection Time: 08/09/14 11:42 PM  Result Value Ref Range Status   Specimen Description BLOOD LEFT HAND  Final   Special Requests BOTTLES DRAWN AEROBIC ONLY [place]  Final   Culture NO GROWTH 2 DAYS  Final   Report Status PENDING  Incomplete  Culture, blood (x 2)     Status: None (Preliminary result)   Collection Time: 08/09/14 11:50 PM  Result Value Ref Range Status   Specimen Description BLOOD RIGHT HAND  Final   Special Requests   Final    BOTTLES DRAWN AEROBIC AND ANAEROBIC [place] BLUE 5CC PURPLE   Culture NO GROWTH 2 DAYS  Final   Report Status PENDING  Incomplete    Radiology Reports Dg Elbow Complete Right  08/05/2014   CLINICAL DATA:  Right elbow pain that radiates to the shoulder. Pain started after picking up horse feed.  EXAM: RIGHT ELBOW - COMPLETE 3+ VIEW  COMPARISON:  None.  FINDINGS: There is no evidence of fracture, dislocation, or joint effusion. There is no evidence of arthropathy or other focal bone abnormality. Soft tissues are unremarkable.  IMPRESSION: Negative.   Electronically Signed   By: Markus Daft M.D.   On: 08/05/2014 08:05   Mr  Humerus Right Wo Contrast  08/12/2014   CLINICAL DATA:  Myositis in the right arm.  Injury 2 weeks ago.  EXAM: MRI OF THE RIGHT HUMERUS WITHOUT CONTRAST  TECHNIQUE: Multiplanar, multisequence MR imaging was performed. No intravenous contrast was administered.  COMPARISON:  08/10/2014  FINDINGS: Dependent atelectasis in the right lower lobe.  There is considerable abnormal edema in the lateral, long, and medial head of the triceps diffusely. The internal tendon of the medial head seems torn on images 20 through 23 of series 7, with an associated 2.2 by 0.6 by 1.4 cm fluid collection along the proximal margin of the tendon.  There is edema in the coracobrachialis muscle tendon a small medial portion of the brachialis muscle.  Left axillary lymph nodes measure up to 1.2 cm in short axis. Edema tracks along the axillary neurovascular structures.  Fluid signal tracks superficial to the posterior deltoid muscle and superficial to the latissimus dorsi and infraspinatus and teres minor muscles.  Subcutaneous edema tracks in the upper arm circumferentially, somewhat more confluent anteriorly.  Small elbow joint effusion.  No significant abnormal osseous edema.  IMPRESSION: 1. Again observed is extensive myositis primarily involving the triceps and coracobrachialis muscles, with a small intrasubstance fluid collection along the proximal tendinous margin of the medial head of the triceps which may represent musculotendinous tear or less likely a very small abscess. This collection is similar to prior. Possibilities include posttraumatic myositis, infection, or a vascular etiology. Mild reactive axillary adenopathy on the right may suggest an infectious component. 2. Subcutaneous edema in the upper arm, mildly increased. Cellulitis not excluded. 3. Edema tracks along the superficial margin of the deltoid muscle, teres minor, and infraspinatus as well as the latissimus dorsi. This may be incidental but shear injury cannot be  excluded.   Electronically Signed   By: Van Clines M.D.   On:  08/12/2014 08:46   Mr Humerus Right Wo Contrast  08/10/2014   CLINICAL DATA:  Right arm pain after an injury throwing a bag of horse feed 2 weeks ago.  EXAM: MRI OF THE RIGHT HUMERUS WITHOUT CONTRAST  TECHNIQUE: Multiplanar, multisequence MR imaging was performed. No intravenous contrast was administered.  COMPARISON:  MRI dated 08/08/2014 and radiograph dated 05/05/2014  FINDINGS: The edema in the right triceps muscle is even more extensive than on the prior study. The muscle is larger than on the prior study. There is only slight sparing of the proximal aspect of the muscle. Subcutaneous edema is more prominent. The underlying humerus and the neurovascular bundle are normal. The focal area of abnormal signal at the distal mild tendinous junction is unchanged.  IMPRESSION: Progressive edema of the triceps muscle consistent with myositis. Focal area of abnormal signal at the mild tendinous junction could represent a tear or small abscess but this is not progressed. Given the patient's elevated white blood count, infectious myositis must be considered.   Electronically Signed   By: Lorriane Shire M.D.   On: 08/10/2014 08:50   Mr Humerus Right Wo Contrast  08/09/2014   CLINICAL DATA:  Pain throughout entire right humerus for 2 weeks.  EXAM: MRI OF THE RIGHT HUMERUS WITHOUT CONTRAST  TECHNIQUE: Multiplanar, multisequence MR imaging was performed. No intravenous contrast was administered.  COMPARISON:  None.  FINDINGS: No marrow signal abnormality. No fracture or dislocation. There is no bone destruction or periosteal reaction.  There is severe edema within the right triceps muscle with small areas of sparing proximally. At the distal musculotendinous junction of the medial head of the triceps, there is a complex 2 x 2 x 2.3 cm T2 signal abnormality with central T2 hyperintensity and peripheral intermediate signal. There is mild edema in the  adjacent subcutaneous fat.  The biceps musculature is normal. There is no other fluid collection or soft tissue mass. The neurovascular bundles are grossly normal. There are multiple prominent right axillary lymph nodes.  IMPRESSION: 1. Severe edema within the right triceps muscle with small areas of sparing proximally. At the distal musculotendinous junction of the medial head of the triceps, there is a complex 2 x 2 x 2.3 cm T2 signal abnormality. The overall appearance is concerning for myositis which may be secondary to an infectious or inflammatory etiology. The 2 cm area of focal abnormal signal which may reflect a hematoma secondary to a partial tear given the location is at the musculotendinous junction of the medial head of the triceps, but given the extensiveness of the muscle edema, this may reflect a developing abscess versus myonecrosis.   Electronically Signed   By: Kathreen Devoid   On: 08/09/2014 08:31   US Renal  08/10/2014   CLINICAL DATA:  Acute kidney injury.  Elevated creatinine level.  EXAM: RENAL / URINARY TRACT ULTRASOUND COMPLETE  COMPARISON:  CT examination 03/09/2013 and ultrasound 10/04/2008  FINDINGS: Right Kidney:  Length: 10.6 cm. The right kidney parenchyma is echogenic without hydronephrosis. There is a heterogeneous hypoechoic structure along the upper pole measuring up to 1.1 cm. Previously, the patient had an abscess in the right kidney and unclear if this sequela from the abscess or a new small complex lesion.  Left Kidney:  Length: 10.6 cm. Left kidney is not well visualized but appears to be echogenic. Negative for hydronephrosis.  Bladder:  Appears normal for degree of bladder distention.  IMPRESSION: Negative for hydronephrosis.  Increased echogenicity of both kidneys.  Findings raise concern for chronic medical renal disease.  There is a 1.1 cm heterogeneous hypoechoic structure in the right kidney upper pole. Unclear if this is the sequelae of previous renal abscess versus  new small complex cystic lesion. Evaluation of this area will be difficult due to acute kidney injury and an elevated creatinine level. However, this may be better characterized with a non contrast MRI versus close surveillance with ultrasound.   Electronically Signed   By: Markus Daft M.D.   On: 08/10/2014 11:34   Ct Femur Right Wo Contrast  08/10/2014   CLINICAL DATA:  Myositis. Sudden onset of pain while lifting a feed bag 2 weeks ago.  EXAM: CT OF THE LOWER RIGHT EXTREMITY WITHOUT CONTRAST  TECHNIQUE: Multidetector CT imaging of the right femur and right tibia fibula was performed according to the standard protocol.  COMPARISON:  None.  FINDINGS: There is no bony abnormality. There is no radiopaque foreign body. There is no soft tissue mass. There is no abscess or drainable fluid collection. There is no soft tissue gas.  There is soft tissue edema around the muscles of the right lower extremity, particularly around the adductor of the via and the quadriceps musculature. This is consistent with the clinically described suspicion of myositis. There is a small knee joint effusion.  IMPRESSION: Soft tissue edema surrounding the musculature of the right lower extremity consistent with myositis. No mass or drainable fluid collection.   Electronically Signed   By: Andreas Newport M.D.   On: 08/10/2014 03:49   Ct Tibia Fibula Right Wo Contrast  08/10/2014   CLINICAL DATA:  Myositis. Sudden onset of pain while lifting a feed bag 2 weeks ago.  EXAM: CT OF THE LOWER RIGHT EXTREMITY WITHOUT CONTRAST  TECHNIQUE: Multidetector CT imaging of the right femur and right tibia fibula was performed according to the standard protocol.  COMPARISON:  None.  FINDINGS: There is no bony abnormality. There is no radiopaque foreign body. There is no soft tissue mass. There is no abscess or drainable fluid collection. There is no soft tissue gas.  There is soft tissue edema around the muscles of the right lower extremity,  particularly around the adductor of the via and the quadriceps musculature. This is consistent with the clinically described suspicion of myositis. There is a small knee joint effusion.  IMPRESSION: Soft tissue edema surrounding the musculature of the right lower extremity consistent with myositis. No mass or drainable fluid collection.   Electronically Signed   By: Andreas Newport M.D.   On: 08/10/2014 03:49   Mr Femur Right Wo Contrast  08/12/2014   CLINICAL DATA:  Myositis of the right arm and right thigh. Status post physical strain 2 weeks ago.  EXAM: MRI OF THE RIGHT FEMUR WITHOUT CONTRAST  TECHNIQUE: Multiplanar, multisequence MR imaging of the right femur was performed. No intravenous contrast was administered.  COMPARISON:  CT right femur 08/10/2014  FINDINGS: There is severe muscle edema within the vastus medialis and intermedius muscle. There is a 14 mm more focal area of fluid signal within the vastus medialis which may reflect a more focal edema versus a small fluid collection. There is mild muscle edema within the vastus lateralis and rectus femoris. There is severe muscle edema within the sartorius muscle. There is a small amount of fluid along the fascia of the flexor compartment. There is small amount of fluid along the fascia of the flexor compartment and around the adductor compartment. There is no drainable focal  fluid collection. There is no hematoma.  There is no focal marrow signal abnormality. There is no fracture or subluxation. No periosteal reaction.  IMPRESSION: 1. Severe muscle edema within the vastus medialis, vastus intermedius and sartorius muscles most consistent with myositis secondary to an infectious or inflammatory etiology. 14 mm more focal area of fluid signal within the vastus medialis which may reflect a more focal edema versus a small fluid collection. Low level edema within the vastus lateralis and rectus femoris likely reflecting myositis to a much lesser degree.    Electronically Signed   By: Kathreen Devoid   On: 08/12/2014 08:57   Dg Femur, Min 2 Views Right  08/10/2014   CLINICAL DATA:  Right thigh pain. Concern for myositis. Rule out soft tissue air.  EXAM: RIGHT FEMUR 2 VIEWS  COMPARISON:  None.  FINDINGS: Cortical margins of the right femur are intact. No fracture, focal osseous lesion, erosion or periosteal reaction. No soft tissue air or radiopaque foreign body. Questionable soft tissue edema proximally.  IMPRESSION: Suspect mild soft tissue edema proximally. No soft tissue air, radiopaque foreign body, or acute osseous abnormality.   Electronically Signed   By: Jeb Levering M.D.   On: 08/10/2014 01:48     CBC  Recent Labs Lab 08/09/14 1819 08/10/14 0128 08/11/14 0507 08/12/14 0538 08/13/14 0540  WBC 22.6* 18.7* 16.9* 15.0* 15.0*  HGB 9.5* 7.8* 7.4* 7.3* 7.3*  HCT 28.5* 23.4* 23.0* 22.8* 22.3*  PLT 308 242 250 276 291  MCV 82.6 83.0 84.2 83.8 82.9  MCH 27.5 27.7 27.1 26.8 27.1  MCHC 33.3 33.3 32.2 32.0 32.7  RDW 13.6 13.6 14.0 13.9 14.0  LYMPHSABS 1.5  --  1.0  --  0.9  MONOABS 1.5*  --  1.5*  --  1.4*  EOSABS 0.0  --  0.1  --  0.1  BASOSABS 0.0  --  0.1  --  0.1    Chemistries   Recent Labs Lab 08/09/14 1819 08/09/14 2350 08/10/14 0128 08/11/14 0507 08/12/14 0538 08/13/14 0342  NA 136  --  135 132* 131* 130*  K 4.5  --  4.0 4.7 4.6 4.8  CL 105  --  103 105 102 102  CO2 22  --  21* 17* 15* 12*  GLUCOSE 68  --  101* 176* 215* 223*  BUN 41*  --  39* 42* 47* 54*  CREATININE 5.46*  --  5.33* 5.39* 5.91* 6.37*  CALCIUM 9.0  --  8.2* 8.5* 8.5* 8.4*  MG  --  2.0  --   --   --   --   AST 28  --  38  --   --   --   ALT 15  --  18  --   --   --   ALKPHOS 127*  --  166*  --   --   --   BILITOT 0.5  --  0.6  --   --   --    ------------------------------------------------------------------------------------------------------------------ estimated creatinine clearance is 11.9 mL/min (by C-G formula based on Cr of  6.37). ------------------------------------------------------------------------------------------------------------------ No results for input(s): HGBA1C in the last 72 hours. ------------------------------------------------------------------------------------------------------------------ No results for input(s): CHOL, HDL, LDLCALC, TRIG, CHOLHDL, LDLDIRECT in the last 72 hours. ------------------------------------------------------------------------------------------------------------------  Recent Labs  08/13/14 0342  TSH 17.840*   ------------------------------------------------------------------------------------------------------------------  Recent Labs  08/10/14 1220  VITAMINB12 545  FOLATE 12.8  FERRITIN 127  TIBC 179*  IRON 9*  RETICCTPCT 1.4    Coagulation profile  Recent Labs Lab 08/09/14 2350  INR 1.31    No results for input(s): DDIMER in the last 72 hours.  Cardiac Enzymes No results for input(s): CKMB, TROPONINI, MYOGLOBIN in the last 168 hours.  Invalid input(s): CK ------------------------------------------------------------------------------------------------------------------ Invalid input(s): POCBNP   Time Spent in minutes  35   SINGH,PRASHANT K M.D on 08/13/2014 at 9:53 AM  Between 7am to 7pm - Pager - [phone]  After 7pm go to [email or web address] - password [facility]  [name]   Office  [phone]

## 2014-08-13 NOTE — Progress Notes (Signed)
Subjective: Patient seen and examined at bedside.  Patient feels about the same.  There's been no significant change in her white blood cell count or her swelling per se.  Unfortunately her kidney functions continued to stay highly abnormal. She states she is urinating without difficulty. She states that she has no burning/dysuria  Objective: Vital signs in last 24 hours: Temp:  [98.1 F (36.7 C)-99.7 F (37.6 C)] 98.8 F (37.1 C) (07/02 0508) Pulse Rate:  [102-104] 104 (07/02 0508) Resp:  [14-20] 14 (07/02 0508) BP: (151-160)/(69-85) 153/80 mmHg (07/02 0508) SpO2:  [95 %-98 %] 95 % (07/02 0508) Weight:  [68 kg (149 lb 14.6 oz)] 68 kg (149 lb 14.6 oz) (07/01 2140)  Intake/Output from previous day: 07/01 0701 - 07/02 0700 In: 3470 [P.O.:720; I.V.:2400; IV Piggyback:350] Out: 450 [Urine:450] Intake/Output this shift:     Recent Labs  08/11/14 0507 08/12/14 0538 08/13/14 0540  HGB 7.4* 7.3* 7.3*    Recent Labs  08/12/14 0538 08/13/14 0540  WBC 15.0* 15.0*  RBC 2.72* 2.69*  HCT 22.8* 22.3*  PLT 276 291    Recent Labs  08/12/14 0538 08/13/14 0342  NA 131* 130*  K 4.6 4.8  CL 102 102  CO2 15* 12*  BUN 47* 54*  CREATININE 5.91* 6.37*  GLUCOSE 215* 223*  CALCIUM 8.5* 8.4*   No results for input(s): LABPT, INR in the last 72 hours.  Physical exam: Her right arm is without significant change. She has range of motion about the elbow in terms of pronation and supination to full. Flexion to 40 is nontender. There is no erythema or cellulitis. There is no evidence of compartment syndrome, necrotizing fasciitis, or change in the arms dimensions. There is no pointing abscess either She has normal pulse. She has intact sensation. She is nontender with passive flexion extension of the fingers which she moves quite well actively.  Pronation supination as well as wrist flexion and extension are completely nontender. Her shoulder is nontender to palpation and her biceps  is nontender anteriorly. She is still remaining tender about the triceps where her myositis is prevalent.  Certainly she has not made a lot of progress although I do not see any worsening.  Assessment/Plan: I discussed her case with Dr. Candiss Norse today. Once again she has not made a lot of gains with the myositis. Her white blood cell count is 15 which is improved from admission but not normalized. She has not spiked fevers and she has not declared an abscess in her posterior compartment about the upper arm. There is a small fluid collection however this is likely related to the insult initially sustained at time of her injury.  If this represented a infectious process I would expect it to evolve rapidly. Been afebrile and having a white count that is improved certainly argues against an aggressive infectious process. It is certain that this does represent a significant myositis based upon her serial MRI scans and presentation.  Certainly if she worsens I be more than happy to obtain a muscle biopsy and exploration. For now we'll going continue careful observatory care. The family understands this.   Paulene Floor 08/13/2014, 12:22 PM

## 2014-08-13 NOTE — Progress Notes (Signed)
Assessment/Plan: 1. Acute renal failure(cr 2.84 01/2014) on chronic kidney disease stage IV: prob NSAID and hemodynamically induced. Uremic symptoms likely.  Have discussed poss need for HD and indications and timing. Will show videos/education 2. Myositis  3. Anemia: Iron defic 4. Hyponatremia: Secondary to acute renal failure/free water excretion defect  Subjective: Interval History: Poor appetite, nauseated  Objective: Vital signs in last 24 hours: Temp:  [98.1 F (36.7 C)-99.7 F (37.6 C)] 98.8 F (37.1 C) (07/02 0508) Pulse Rate:  [102-104] 104 (07/02 0508) Resp:  [14-20] 14 (07/02 0508) BP: (151-160)/(69-85) 153/80 mmHg (07/02 0508) SpO2:  [95 %-98 %] 95 % (07/02 0508) Weight:  [68 kg (149 lb 14.6 oz)] 68 kg (149 lb 14.6 oz) (07/01 2140) Weight change:   Intake/Output from previous day: 07/01 0701 - 07/02 0700 In: 3470 [P.O.:720; I.V.:2400; IV Piggyback:350] Out: 450 [Urine:450] Intake/Output this shift:    General appearance: alert and cooperative Resp: clear to auscultation bilaterally Chest wall: no tenderness Cardio: regular rate and rhythm, S1, S2 normal, no murmur, click, rub or gallop GI: soft, mild epigastric tenderness; bowel sounds normal; no masses,  no organomegaly Extremities: edema right arm and right thigh and leg swollen and tender  Lab Results:  Recent Labs  08/12/14 0538 08/13/14 0540  WBC 15.0* 15.0*  HGB 7.3* 7.3*  HCT 22.8* 22.3*  PLT 276 291   BMET:  Recent Labs  08/12/14 0538 08/13/14 0342  NA 131* 130*  K 4.6 4.8  CL 102 102  CO2 15* 12*  GLUCOSE 215* 223*  BUN 47* 54*  CREATININE 5.91* 6.37*  CALCIUM 8.5* 8.4*   No results for input(s): PTH in the last 72 hours. Iron Studies:  Recent Labs  08/10/14 1220  IRON 9*  TIBC 179*  FERRITIN 127   Studies/Results: Mr Humerus Right Wo Contrast  08/12/2014   CLINICAL DATA:  Myositis in the right arm.  Injury 2 weeks ago.  EXAM: MRI OF THE RIGHT HUMERUS WITHOUT CONTRAST   TECHNIQUE: Multiplanar, multisequence MR imaging was performed. No intravenous contrast was administered.  COMPARISON:  08/10/2014  FINDINGS: Dependent atelectasis in the right lower lobe.  There is considerable abnormal edema in the lateral, long, and medial head of the triceps diffusely. The internal tendon of the medial head seems torn on images 20 through 23 of series 7, with an associated 2.2 by 0.6 by 1.4 cm fluid collection along the proximal margin of the tendon.  There is edema in the coracobrachialis muscle tendon a small medial portion of the brachialis muscle.  Left axillary lymph nodes measure up to 1.2 cm in short axis. Edema tracks along the axillary neurovascular structures.  Fluid signal tracks superficial to the posterior deltoid muscle and superficial to the latissimus dorsi and infraspinatus and teres minor muscles.  Subcutaneous edema tracks in the upper arm circumferentially, somewhat more confluent anteriorly.  Small elbow joint effusion.  No significant abnormal osseous edema.  IMPRESSION: 1. Again observed is extensive myositis primarily involving the triceps and coracobrachialis muscles, with a small intrasubstance fluid collection along the proximal tendinous margin of the medial head of the triceps which may represent musculotendinous tear or less likely a very small abscess. This collection is similar to prior. Possibilities include posttraumatic myositis, infection, or a vascular etiology. Mild reactive axillary adenopathy on the right may suggest an infectious component. 2. Subcutaneous edema in the upper arm, mildly increased. Cellulitis not excluded. 3. Edema tracks along the superficial margin of the deltoid muscle, teres minor, and  infraspinatus as well as the latissimus dorsi. This may be incidental but shear injury cannot be excluded.   Electronically Signed   By: Van Clines M.D.   On: 08/12/2014 08:46   Mr Femur Right Wo Contrast  08/12/2014   CLINICAL DATA:  Myositis  of the right arm and right thigh. Status post physical strain 2 weeks ago.  EXAM: MRI OF THE RIGHT FEMUR WITHOUT CONTRAST  TECHNIQUE: Multiplanar, multisequence MR imaging of the right femur was performed. No intravenous contrast was administered.  COMPARISON:  CT right femur 08/10/2014  FINDINGS: There is severe muscle edema within the vastus medialis and intermedius muscle. There is a 14 mm more focal area of fluid signal within the vastus medialis which may reflect a more focal edema versus a small fluid collection. There is mild muscle edema within the vastus lateralis and rectus femoris. There is severe muscle edema within the sartorius muscle. There is a small amount of fluid along the fascia of the flexor compartment. There is small amount of fluid along the fascia of the flexor compartment and around the adductor compartment. There is no drainable focal fluid collection. There is no hematoma.  There is no focal marrow signal abnormality. There is no fracture or subluxation. No periosteal reaction.  IMPRESSION: 1. Severe muscle edema within the vastus medialis, vastus intermedius and sartorius muscles most consistent with myositis secondary to an infectious or inflammatory etiology. 14 mm more focal area of fluid signal within the vastus medialis which may reflect a more focal edema versus a small fluid collection. Low level edema within the vastus lateralis and rectus femoris likely reflecting myositis to a much lesser degree.   Electronically Signed   By: Kathreen Devoid   On: 08/12/2014 08:57   Scheduled: . amLODipine  10 mg Oral Daily  . aztreonam  1 g Intravenous Q8H  . famotidine  40 mg Oral BID  . heparin  5,000 Units Subcutaneous 3 times per day  . insulin aspart  0-15 Units Subcutaneous TID WC  . insulin aspart  0-5 Units Subcutaneous QHS  . insulin glargine  25 Units Subcutaneous Daily  . levothyroxine  125 mcg Oral QAC breakfast  . metoprolol tartrate  25 mg Oral BID  . multivitamin with  minerals  1 tablet Oral Daily  . sodium bicarbonate  1,300 mg Oral BID  . sodium chloride  1,000 mL Intravenous Once  . sodium chloride  3 mL Intravenous Q12H  . vancomycin  1,000 mg Intravenous Q48H     LOS: 4 days   Tracy [name] C 08/13/2014,10:35 AM

## 2014-08-14 ENCOUNTER — Encounter (HOSPITAL_COMMUNITY)
Admission: EM | Disposition: A | Discharge status: Home Health | Payer: Self-pay | Source: Home / Self Care | Attending: Internal Medicine

## 2014-08-14 ENCOUNTER — Inpatient Hospital Stay (HOSPITAL_COMMUNITY): Payer: BLUE CROSS/BLUE SHIELD | Admitting: Anesthesiology

## 2014-08-14 ENCOUNTER — Encounter (HOSPITAL_COMMUNITY): Payer: Self-pay | Admitting: Anesthesiology

## 2014-08-14 HISTORY — PX: I&D EXTREMITY: SHX5045

## 2014-08-14 LAB — BASIC METABOLIC PANEL
Anion gap: 11 (ref 5–15)
BUN: 58 mg/dL — ABNORMAL HIGH (ref 6–20)
CHLORIDE: 104 mmol/L (ref 101–111)
CO2: 17 mmol/L — AB (ref 22–32)
Calcium: 8.7 mg/dL — ABNORMAL LOW (ref 8.9–10.3)
Creatinine, Ser: 7.33 mg/dL — ABNORMAL HIGH (ref 0.44–1.00)
GFR calc Af Amer: 8 mL/min — ABNORMAL LOW (ref >60)
GFR, EST NON AFRICAN AMERICAN: 7 mL/min — AB (ref >60)
GLUCOSE: 103 mg/dL — AB (ref 65–99)
POTASSIUM: 4.3 mmol/L (ref 3.5–5.1)
SODIUM: 132 mmol/L — AB (ref 135–145)

## 2014-08-14 LAB — CBC
HCT: 25.1 % — ABNORMAL LOW (ref 36.0–46.0)
Hemoglobin: 8.2 g/dL — ABNORMAL LOW (ref 12.0–15.0)
MCH: 27.4 pg (ref 26.0–34.0)
MCHC: 32.7 g/dL (ref 30.0–36.0)
MCV: 83.9 fL (ref 78.0–100.0)
Platelets: 204 10*3/uL (ref 150–400)
RBC: 2.99 MIL/uL — ABNORMAL LOW (ref 3.87–5.11)
RDW: 14.5 % (ref 11.5–15.5)
WBC: 18.4 10*3/uL — ABNORMAL HIGH (ref 4.0–10.5)

## 2014-08-14 LAB — GLUCOSE, CAPILLARY
GLUCOSE-CAPILLARY: 71 mg/dL (ref 65–99)
Glucose-Capillary: 92 mg/dL (ref 65–99)
Glucose-Capillary: 106 mg/dL — ABNORMAL HIGH (ref 65–99)
Glucose-Capillary: 68 mg/dL (ref 65–99)
Glucose-Capillary: 93 mg/dL (ref 65–99)

## 2014-08-14 LAB — PHOSPHORUS: Phosphorus: 6.5 mg/dL — ABNORMAL HIGH (ref 2.5–4.6)

## 2014-08-14 SURGERY — IRRIGATION AND DEBRIDEMENT EXTREMITY
Anesthesia: General | Laterality: Right

## 2014-08-14 MED ORDER — HYDROMORPHONE HCL 1 MG/ML IJ SOLN
0.2500 mg | INTRAMUSCULAR | Status: DC | PRN
  Administered 2014-08-14 (×4): 0.5 mg via INTRAVENOUS

## 2014-08-14 MED ORDER — HYDROMORPHONE HCL 1 MG/ML IJ SOLN
INTRAMUSCULAR | Status: AC
  Administered 2014-08-14: 0.5 mg via INTRAVENOUS
  Filled 2014-08-14: qty 1

## 2014-08-14 MED ORDER — BUPIVACAINE HCL (PF) 0.25 % IJ SOLN
INTRAMUSCULAR | Status: DC | PRN
  Administered 2014-08-14: 8 mL

## 2014-08-14 MED ORDER — SUCCINYLCHOLINE CHLORIDE 20 MG/ML IJ SOLN
INTRAMUSCULAR | Status: AC
  Filled 2014-08-14: qty 2

## 2014-08-14 MED ORDER — PHENYLEPHRINE HCL 10 MG/ML IJ SOLN
INTRAMUSCULAR | Status: AC
  Filled 2014-08-14: qty 1

## 2014-08-14 MED ORDER — PROPOFOL 10 MG/ML IV BOLUS
INTRAVENOUS | Status: AC
  Filled 2014-08-14: qty 20

## 2014-08-14 MED ORDER — DEXTROSE 50 % IV SOLN
INTRAVENOUS | Status: AC
  Administered 2014-08-14: 25 mL
  Filled 2014-08-14: qty 50

## 2014-08-14 MED ORDER — MIDAZOLAM HCL 5 MG/5ML IJ SOLN
INTRAMUSCULAR | Status: DC | PRN
  Administered 2014-08-14: 1 mg via INTRAVENOUS

## 2014-08-14 MED ORDER — FENTANYL CITRATE (PF) 100 MCG/2ML IJ SOLN
INTRAMUSCULAR | Status: DC | PRN
  Administered 2014-08-14 (×3): 50 ug via INTRAVENOUS
  Administered 2014-08-14: 100 ug via INTRAVENOUS

## 2014-08-14 MED ORDER — HYDRALAZINE HCL 20 MG/ML IJ SOLN
10.0000 mg | Freq: Four times a day (QID) | INTRAMUSCULAR | Status: DC | PRN
  Filled 2014-08-14: qty 1

## 2014-08-14 MED ORDER — SODIUM CHLORIDE 0.9 % IR SOLN
Status: DC | PRN
  Administered 2014-08-14: 3000 mL

## 2014-08-14 MED ORDER — BUPIVACAINE-EPINEPHRINE (PF) 0.25% -1:200000 IJ SOLN
INTRAMUSCULAR | Status: AC
  Filled 2014-08-14: qty 30

## 2014-08-14 MED ORDER — SUCCINYLCHOLINE CHLORIDE 20 MG/ML IJ SOLN
INTRAMUSCULAR | Status: DC | PRN
  Administered 2014-08-14: 80 mg via INTRAVENOUS

## 2014-08-14 MED ORDER — LIDOCAINE HCL (CARDIAC) 20 MG/ML IV SOLN
INTRAVENOUS | Status: AC
  Filled 2014-08-14: qty 5

## 2014-08-14 MED ORDER — PROPOFOL 10 MG/ML IV BOLUS
INTRAVENOUS | Status: DC | PRN
  Administered 2014-08-14: 150 mg via INTRAVENOUS

## 2014-08-14 MED ORDER — PROMETHAZINE HCL 25 MG/ML IJ SOLN
6.2500 mg | INTRAMUSCULAR | Status: DC | PRN
Start: 2014-08-14 — End: 2014-08-14

## 2014-08-14 MED ORDER — LIDOCAINE HCL (CARDIAC) 20 MG/ML IV SOLN
INTRAVENOUS | Status: DC | PRN
  Administered 2014-08-14: 70 mg via INTRAVENOUS

## 2014-08-14 MED ORDER — MIDAZOLAM HCL 2 MG/2ML IJ SOLN
INTRAMUSCULAR | Status: AC
  Filled 2014-08-14: qty 2

## 2014-08-14 MED ORDER — FENTANYL CITRATE (PF) 250 MCG/5ML IJ SOLN
INTRAMUSCULAR | Status: AC
  Filled 2014-08-14: qty 5

## 2014-08-14 MED ORDER — SODIUM CHLORIDE 0.9 % IV SOLN
INTRAVENOUS | Status: DC | PRN
  Administered 2014-08-14: 14:00:00 via INTRAVENOUS

## 2014-08-14 MED ORDER — ONDANSETRON HCL 4 MG/2ML IJ SOLN
INTRAMUSCULAR | Status: AC
  Filled 2014-08-14: qty 2

## 2014-08-14 MED ORDER — EPHEDRINE SULFATE 50 MG/ML IJ SOLN
INTRAMUSCULAR | Status: AC
  Filled 2014-08-14: qty 1

## 2014-08-14 MED ORDER — METOPROLOL TARTRATE 50 MG PO TABS
50.0000 mg | ORAL_TABLET | Freq: Two times a day (BID) | ORAL | Status: DC
  Administered 2014-08-14 – 2014-09-01 (×32): 50 mg via ORAL
  Filled 2014-08-14 (×40): qty 1

## 2014-08-14 MED ORDER — LIDOCAINE HCL (CARDIAC) 20 MG/ML IV SOLN
INTRAVENOUS | Status: AC
  Filled 2014-08-14: qty 10

## 2014-08-14 MED ORDER — ETOMIDATE 2 MG/ML IV SOLN
INTRAVENOUS | Status: AC
  Filled 2014-08-14: qty 10

## 2014-08-14 SURGICAL SUPPLY — 43 items
BANDAGE ELASTIC 4 VELCRO ST LF (GAUZE/BANDAGES/DRESSINGS) ×3 IMPLANT
BNDG CONFORM 2 STRL LF (GAUZE/BANDAGES/DRESSINGS) IMPLANT
BNDG GAUZE ELAST 4 BULKY (GAUZE/BANDAGES/DRESSINGS) ×5 IMPLANT
CORDS BIPOLAR (ELECTRODE) ×3 IMPLANT
CUFF TOURNIQUET SINGLE 18IN (TOURNIQUET CUFF) ×1 IMPLANT
CUFF TOURNIQUET SINGLE 24IN (TOURNIQUET CUFF) IMPLANT
DRSG ADAPTIC 3X8 NADH LF (GAUZE/BANDAGES/DRESSINGS) ×1 IMPLANT
ELECT REM PT RETURN 9FT ADLT (ELECTROSURGICAL)
ELECTRODE REM PT RTRN 9FT ADLT (ELECTROSURGICAL) IMPLANT
GAUZE SPONGE 4X4 12PLY STRL (GAUZE/BANDAGES/DRESSINGS) ×3 IMPLANT
GAUZE XEROFORM 1X8 LF (GAUZE/BANDAGES/DRESSINGS) ×1 IMPLANT
GAUZE XEROFORM 5X9 LF (GAUZE/BANDAGES/DRESSINGS) ×2 IMPLANT
GLOVE BIOGEL M STRL SZ7.5 (GLOVE) ×3 IMPLANT
GLOVE SS BIOGEL STRL SZ 8 (GLOVE) ×1 IMPLANT
GLOVE SUPERSENSE BIOGEL SZ 8 (GLOVE) ×2
GOWN STRL REUS W/ TWL LRG LVL3 (GOWN DISPOSABLE) ×3 IMPLANT
GOWN STRL REUS W/ TWL XL LVL3 (GOWN DISPOSABLE) ×3 IMPLANT
GOWN STRL REUS W/TWL LRG LVL3 (GOWN DISPOSABLE) ×9
GOWN STRL REUS W/TWL XL LVL3 (GOWN DISPOSABLE) ×9
HANDPIECE INTERPULSE COAX TIP (DISPOSABLE)
KIT BASIN OR (CUSTOM PROCEDURE TRAY) ×3 IMPLANT
KIT ROOM TURNOVER OR (KITS) ×3 IMPLANT
MANIFOLD NEPTUNE II (INSTRUMENTS) ×3 IMPLANT
NDL HYPO 25GX1X1/2 BEV (NEEDLE) IMPLANT
NEEDLE HYPO 25GX1X1/2 BEV (NEEDLE) ×3 IMPLANT
NS IRRIG 1000ML POUR BTL (IV SOLUTION) ×3 IMPLANT
PACK ORTHO EXTREMITY (CUSTOM PROCEDURE TRAY) ×3 IMPLANT
PAD ARMBOARD 7.5X6 YLW CONV (MISCELLANEOUS) ×6 IMPLANT
PAD CAST 4YDX4 CTTN HI CHSV (CAST SUPPLIES) ×1 IMPLANT
PADDING CAST COTTON 4X4 STRL (CAST SUPPLIES) ×3
SET HNDPC FAN SPRY TIP SCT (DISPOSABLE) IMPLANT
SPLINT FIBERGLASS 4X30 (CAST SUPPLIES) ×2 IMPLANT
SPONGE GAUZE 4X4 12PLY STER LF (GAUZE/BANDAGES/DRESSINGS) ×2 IMPLANT
SPONGE LAP 18X18 X RAY DECT (DISPOSABLE) ×1 IMPLANT
SPONGE LAP 4X18 X RAY DECT (DISPOSABLE) ×1 IMPLANT
SYR CONTROL 10ML LL (SYRINGE) IMPLANT
TOWEL OR 17X24 6PK STRL BLUE (TOWEL DISPOSABLE) ×3 IMPLANT
TOWEL OR 17X26 10 PK STRL BLUE (TOWEL DISPOSABLE) ×3 IMPLANT
TUBE ANAEROBIC SPECIMEN COL (MISCELLANEOUS) IMPLANT
TUBE CONNECTING 12'X1/4 (SUCTIONS) ×1
TUBE CONNECTING 12X1/4 (SUCTIONS) ×2 IMPLANT
WATER STERILE IRR 1000ML POUR (IV SOLUTION) ×1 IMPLANT
YANKAUER SUCT BULB TIP NO VENT (SUCTIONS) ×3 IMPLANT

## 2014-08-14 NOTE — Transfer of Care (Signed)
Immediate Anesthesia Transfer of Care Note  Patient: Tracy [name]  Procedure(s) Performed: Procedure(s): IRRIGATION AND DEBRIDEMENT EXTREMITY WITH MUSCLE BIOPSY (Right)  Patient Location: PACU  Anesthesia Type:General  Level of Consciousness: awake  Airway & Oxygen Therapy: Patient Spontanous Breathing and Patient connected to face mask oxygen  Post-op Assessment: Report given to RN and Post -op Vital signs reviewed and stable  Post vital signs: Reviewed and stable  Last Vitals:  Filed Vitals:   08/14/14 1531  BP: 156/81  Pulse: 98  Temp:   Resp: 13    Complications: No apparent anesthesia complications

## 2014-08-14 NOTE — Op Note (Signed)
See dictation [id number] Operative findings: Significant myositis with muscle biopsy being sent. Patient has small area of fluid collection which we have been following with a MRI scan that was cultured for Gram stain aerobic and anaerobic cultures. There was no advance muscle death or signs of advanced compartment syndrome. The patient had classic signs of a flaky friable deep internal muscle. The superficial muscle was intact and the superficial and deep muscle regions did bleed. The fluid collected was a bit cloudy and thus we will keep a keen I on the culture to see if this was a evolving infectious process versus hematoma. It was not foul-smelling but certainly raise concern of a hematoma that could be evolving into an infectious process. Cultures will tell. Francee Setzer MD

## 2014-08-14 NOTE — Anesthesia Postprocedure Evaluation (Signed)
  Anesthesia Post-op Note  Patient: Tracy [name]  Procedure(s) Performed: Procedure(s): IRRIGATION AND DEBRIDEMENT EXTREMITY WITH MUSCLE BIOPSY (Right)  Patient Location: PACU  Anesthesia Type:General  Level of Consciousness: awake and alert   Airway and Oxygen Therapy: Patient Spontanous Breathing  Post-op Pain: mild  Post-op Assessment: Post-op Vital signs reviewed              Post-op Vital Signs: stable  Last Vitals:  Filed Vitals:   08/14/14 1656  BP: 159/86  Pulse: 99  Temp: 37.1 C  Resp: 14    Complications: No apparent anesthesia complications

## 2014-08-14 NOTE — Progress Notes (Signed)
Patient is declining to watch the kidney failure education videos tonight. She wants to watch them with her mom when she is around tomorrow.

## 2014-08-14 NOTE — Progress Notes (Signed)
Assessment/Plan: 1. Acute renal failure(cr 2.84 01/2014) on chronic kidney disease stage IV: prob NSAID and hemodynamically induced. Uremia therefore will initiate dialysis & request IR for PC.  Will show videos/education  HD Monday.  Will need AVF. 2. Myositis for biopsy today 3. Anemia: Iron defic 4. Hyponatremia: Secondary to acute renal failure/free water excretion defect  Subjective: Interval History: Feels bad and not eating  Objective: Vital signs in last 24 hours: Temp:  [98.3 F (36.8 C)-99.5 F (37.5 C)] 99 F (37.2 C) (07/03 0500) Pulse Rate:  [91-101] 91 (07/03 0500) Resp:  [18] 18 (07/03 0500) BP: (152-179)/(74-83) 179/74 mmHg (07/03 0500) SpO2:  [93 %-95 %] 95 % (07/03 0500) Weight:  [68.4 kg (150 lb 12.7 oz)] 68.4 kg (150 lb 12.7 oz) (07/02 2100) Weight change: 0.4 kg (14.1 oz)  Intake/Output from previous day: 07/02 0701 - 07/03 0700 In: 420 [P.O.:420] Out: -  Intake/Output this shift:    General appearance: alert and cooperative Resp: clear to auscultation bilaterally Cardio: regular rate and rhythm, S1, S2 normal, no murmur, click, rub or gallop GI: mild RUQ tenderness Extremities: edema RUE and R Thigh  Lab Results:  Recent Labs  08/13/14 0540 08/14/14 0846  WBC 15.0* 18.4*  HGB 7.3* 8.2*  HCT 22.3* 25.1*  PLT 291 204   BMET:  Recent Labs  08/13/14 0342 08/14/14 0846  NA 130* 132*  K 4.8 4.3  CL 102 104  CO2 12* 17*  GLUCOSE 223* 103*  BUN 54* 58*  CREATININE 6.37* 7.33*  CALCIUM 8.4* 8.7*   No results for input(s): PTH in the last 72 hours. Iron Studies: No results for input(s): IRON, TIBC, TRANSFERRIN, FERRITIN in the last 72 hours. Studies/Results: No results found.  Scheduled: . amLODipine  10 mg Oral Daily  . aztreonam  1 g Intravenous Q8H  . famotidine  40 mg Oral BID  . heparin  5,000 Units Subcutaneous 3 times per day  . insulin aspart  0-15 Units Subcutaneous TID WC  . insulin aspart  0-5 Units Subcutaneous QHS  .  insulin glargine  25 Units Subcutaneous Daily  . levothyroxine  125 mcg Oral QAC breakfast  . metoprolol tartrate  25 mg Oral BID  . multivitamin with minerals  1 tablet Oral Daily  . sodium bicarbonate  1,300 mg Oral BID  . sodium chloride  1,000 mL Intravenous Once  . sodium chloride  3 mL Intravenous Q12H  . vancomycin  1,000 mg Intravenous Q48H     LOS: 5 days   Kristina Bertone C 08/14/2014,10:00 AM

## 2014-08-14 NOTE — Anesthesia Procedure Notes (Signed)
Procedure Name: Intubation Date/Time: 08/14/2014 2:05 PM Performed by: Greggory Stallion, Daylani Deblois L Pre-anesthesia Checklist: Patient identified, Emergency Drugs available, Suction available, Patient being monitored and Timeout performed Patient Re-evaluated:Patient Re-evaluated prior to inductionOxygen Delivery Method: Circle system utilized Preoxygenation: Pre-oxygenation with 100% oxygen Intubation Type: IV induction, Rapid sequence and Cricoid Pressure applied Laryngoscope Size: 3 and Mac Grade View: Grade I Tube type: Oral Number of attempts: 1 Airway Equipment and Method: Stylet Placement Confirmation: ETT inserted through vocal cords under direct vision,  breath sounds checked- equal and bilateral and positive ETCO2 Secured at: 20 cm Tube secured with: Tape Dental Injury: Teeth and Oropharynx as per pre-operative assessment

## 2014-08-14 NOTE — Anesthesia Preprocedure Evaluation (Addendum)
Anesthesia Evaluation  Patient identified by MRN, date of birth, ID band  Reviewed: Allergy & Precautions, NPO status , Patient's Chart, lab work & pertinent test results  Airway Mallampati: I       Dental  (+) Teeth Intact   Pulmonary  breath sounds clear to auscultation        Cardiovascular hypertension, Rhythm:Regular Rate:Normal     Neuro/Psych Seizures -,   Neuromuscular disease    GI/Hepatic   Endo/Other  diabetes, Poorly ControlledHypothyroidism   Renal/GU Renal InsufficiencyRenal disease     Musculoskeletal Myositis   Abdominal   Peds  Hematology  (+) anemia ,   Anesthesia Other Findings   Reproductive/Obstetrics                            Anesthesia Physical Anesthesia Plan  ASA: III  Anesthesia Plan: General   Post-op Pain Management:    Induction: Intravenous  Airway Management Planned:   Additional Equipment:   Intra-op Plan:   Post-operative Plan: Extubation in OR  Informed Consent: I have reviewed the patients History and Physical, chart, labs and discussed the procedure including the risks, benefits and alternatives for the proposed anesthesia with the patient or authorized representative who has indicated his/her understanding and acceptance.   Dental advisory given  Plan Discussed with: CRNA and Surgeon  Anesthesia Plan Comments:         Anesthesia Quick Evaluation

## 2014-08-14 NOTE — Progress Notes (Signed)
Patient Demographics:    Tracy [name], is a 29 y.o. female, DOB - 09-Jan-1986, [id number]  Admit date - 08/09/2014   Admitting Physician Ivor Costa, MD  Outpatient Primary MD for the patient is [name]  LOS - 5   Chief Complaint  Patient presents with  . Arm Pain        Subjective:    Tracy [name] today has, No headache, No chest pain, No abdominal pain - No Nausea, No new weakness tingling or numbness, No Cough - SOB. Continues to have right arm and right thigh pain.   Assessment  & Plan :     1. Myositis of the right arm and right thigh. Most likely post physical strain 2 weeks ago while throwing horse feed. Case discussed with rheumatologist Dr. Amil Amen over the phone on the day of admission and then on 08/14/2014, ANA is negative, aldolase unremarkable 2, CK is mildly elevated but has normalized on follow-up. Also being seen and followed by general surgery, hand surgery and orthopedics both on board.   Ultrasound of the right upper arm does not show a clot. Has received IV anti-biotics adequately since admission as infection was not completely ruled out, on 08/14/2014 she continues to up trend her leukocytosis, case was discussed in detail with Dr. Amil Amen rheumatologist, Dr. Phillip Heal a hand surgeon and the plan is to take the patient to the OR for right arm muscle biopsy and drainage of the fluid collection, postop. Anti-biotics tomorrow morning. We'll follow cultures and biopsy report. Per Dr. Amil Amen hold steroids still biopsy shows inflammation or a need for steroids use.   In my opinion her myositis is likely initiated by recent physical stress of moving the horse feed predisposed and worsened by uncontrolled hypothyroidism and diabetes. Clinically she has shown improvement  over the last few days and continues to feel better. Continue supportive care.      2. Hypothyroidism. Was not taking Synthroid for the last couple of months, counseled on compliance, TSH was 25, Synthroid resumed monitorinf TSH is gradually improving.    3. Anemia - iron deficiency on into anemia panel, IV and replaced. Monitor H&H. Transfuse if hemoglobin drops below 7. Outpatient workup for iron deficiency anemia in a menstruating female as appropriate.    4.ARF of CKD 4 - baseline creatinine of 3, had some nausea vomiting and NSAID use prior to admission, likely ATN. Avoid nephrotoxins, continue hydration, renal ultrasound shows cyst but no obstruction, urine output stable, Renal following. May require dialysis this admission.    5. DM1 - poor outpatient control with A1c of 8.9 , on Lantus dose adjusted again along with her ISS,  monitor CBGs.  CBG (last 3)   Recent Labs  08/13/14 1603 08/13/14 2052 08/14/14 0753  GLUCAP 133* 72 92    Lab Results  Component Value Date   HGBA1C 8.9* 08/10/2014     6. UA appears clean with 0-2 WBCs ruling out UTI, cultures growing lactobacilli which likely is contamination from vagina flora. Monitor clinically.     Code Status : Full  Family Communication  : Mother bedside, boyfriend bedside  Disposition Plan  : Home   Consults  :  Orthopedics, hand surgery, general surgery, Renal, rheumatologist Dr. Amil Amen over  the phone 2  Procedures  : Right upper extremity venous duplex. No DVT, MRI right arm and right leg x 2, CT right thigh  DVT Prophylaxis  :   Heparin    Lab Results  Component Value Date   PLT 204 08/14/2014    Inpatient Medications  Scheduled Meds: . amLODipine  10 mg Oral Daily  . aztreonam  1 g Intravenous Q8H  . famotidine  40 mg Oral BID  . heparin  5,000 Units Subcutaneous 3 times per day  . insulin aspart  0-15 Units Subcutaneous TID WC  . insulin aspart  0-5 Units Subcutaneous QHS  . insulin  glargine  25 Units Subcutaneous Daily  . levothyroxine  125 mcg Oral QAC breakfast  . metoprolol tartrate  25 mg Oral BID  . multivitamin with minerals  1 tablet Oral Daily  . sodium bicarbonate  1,300 mg Oral BID  . sodium chloride  1,000 mL Intravenous Once  . sodium chloride  3 mL Intravenous Q12H  . vancomycin  1,000 mg Intravenous Q48H   Continuous Infusions: . sodium chloride 100 mL/hr at 08/14/14 0953   PRN Meds:.acetaminophen **OR** acetaminophen, alum & mag hydroxide-simeth, hydrALAZINE, HYDROcodone-acetaminophen, HYDROmorphone (DILAUDID) injection, ondansetron, zolpidem  Antibiotics  :     Anti-infectives    Start     Dose/Rate Route Frequency Ordered Stop   08/11/14 2200  vancomycin (VANCOCIN) IVPB 1000 mg/200 mL premix     1,000 mg 200 mL/hr over 60 Minutes Intravenous Every 48 hours 08/09/14 2256     08/10/14 1045  vancomycin (VANCOCIN) IVPB 750 mg/150 ml premix  Status:  Discontinued     750 mg 150 mL/hr over 60 Minutes Intravenous Every 12 hours 08/09/14 2252 08/09/14 2256   08/09/14 2230  aztreonam (AZACTAM) 1 g in dextrose 5 % 50 mL IVPB     1 g 100 mL/hr over 30 Minutes Intravenous Every 8 hours 08/09/14 2215     08/09/14 2200  vancomycin (VANCOCIN) IVPB 1000 mg/200 mL premix  Status:  Discontinued     1,000 mg 200 mL/hr over 60 Minutes Intravenous Every 48 hours 08/09/14 2159 08/09/14 2252   08/09/14 2145  vancomycin (VANCOCIN) IVPB 1000 mg/200 mL premix  Status:  Discontinued     1,000 mg 200 mL/hr over 60 Minutes Intravenous  Once 08/09/14 2137 08/09/14 2147        Objective:   Filed Vitals:   08/13/14 1830 08/13/14 2100 08/14/14 0500 08/14/14 1006  BP: 155/77 152/83 179/74 187/81  Pulse: 95 101 91 93  Temp: 99.5 F (37.5 C) 98.3 F (36.8 C) 99 F (37.2 C)   TempSrc: Oral Oral Oral   Resp: 18 18 18 18   Height:      Weight:  68.4 kg (150 lb 12.7 oz)    SpO2: 94% 93% 95% 93%    Wt Readings from Last 3 Encounters:  08/13/14 68.4 kg (150 lb 12.7  oz)  08/08/14 68.493 kg (151 lb)  08/05/14 68.493 kg (151 lb)     Intake/Output Summary (Last 24 hours) at 08/14/14 1123 Last data filed at 08/13/14 1830  Gross per 24 hour  Intake    300 ml  Output      0 ml  Net    300 ml     Physical Exam  Awake Alert, Oriented X 3, No new F.N deficits, Normal affect [place].AT,PERRAL Supple Neck,No JVD, No cervical lymphadenopathy appriciated.  Symmetrical Chest wall movement, Good air movement bilaterally, CTAB RRR,No Gallops,Rubs  or new Murmurs, No Parasternal Heave +ve B.Sounds, Abd Soft, No tenderness, No organomegaly appriciated, No rebound - guarding or rigidity. No Cyanosis, Clubbing or edema, No new Rash or bruise  Swelling in the right arm and right thigh, some tenderness, no warmth, no signs of compartment syndrome    Data Review:   Micro Results Recent Results (from the past 240 hour(s))  Urine culture     Status: None   Collection Time: 08/09/14  1:57 PM  Result Value Ref Range Status   Specimen Description URINE, CLEAN CATCH  Final   Special Requests NONE  Final   Culture >=100,000 COLONIES/mL LACTOBACILLUS SPECIES  Final   Report Status 08/11/2014 FINAL  Final  Culture, blood (x 2)     Status: None (Preliminary result)   Collection Time: 08/09/14 11:42 PM  Result Value Ref Range Status   Specimen Description BLOOD LEFT HAND  Final   Special Requests BOTTLES DRAWN AEROBIC ONLY [place]  Final   Culture NO GROWTH 3 DAYS  Final   Report Status PENDING  Incomplete  Culture, blood (x 2)     Status: None (Preliminary result)   Collection Time: 08/09/14 11:50 PM  Result Value Ref Range Status   Specimen Description BLOOD RIGHT HAND  Final   Special Requests   Final    BOTTLES DRAWN AEROBIC AND ANAEROBIC [place] BLUE 5CC PURPLE   Culture NO GROWTH 3 DAYS  Final   Report Status PENDING  Incomplete    Radiology Reports Dg Elbow Complete Right  08/05/2014   CLINICAL DATA:  Right elbow pain that radiates to the shoulder. Pain started  after picking up horse feed.  EXAM: RIGHT ELBOW - COMPLETE 3+ VIEW  COMPARISON:  None.  FINDINGS: There is no evidence of fracture, dislocation, or joint effusion. There is no evidence of arthropathy or other focal bone abnormality. Soft tissues are unremarkable.  IMPRESSION: Negative.   Electronically Signed   By: Markus Daft M.D.   On: 08/05/2014 08:05   Mr Humerus Right Wo Contrast  08/12/2014   CLINICAL DATA:  Myositis in the right arm.  Injury 2 weeks ago.  EXAM: MRI OF THE RIGHT HUMERUS WITHOUT CONTRAST  TECHNIQUE: Multiplanar, multisequence MR imaging was performed. No intravenous contrast was administered.  COMPARISON:  08/10/2014  FINDINGS: Dependent atelectasis in the right lower lobe.  There is considerable abnormal edema in the lateral, long, and medial head of the triceps diffusely. The internal tendon of the medial head seems torn on images 20 through 23 of series 7, with an associated 2.2 by 0.6 by 1.4 cm fluid collection along the proximal margin of the tendon.  There is edema in the coracobrachialis muscle tendon a small medial portion of the brachialis muscle.  Left axillary lymph nodes measure up to 1.2 cm in short axis. Edema tracks along the axillary neurovascular structures.  Fluid signal tracks superficial to the posterior deltoid muscle and superficial to the latissimus dorsi and infraspinatus and teres minor muscles.  Subcutaneous edema tracks in the upper arm circumferentially, somewhat more confluent anteriorly.  Small elbow joint effusion.  No significant abnormal osseous edema.  IMPRESSION: 1. Again observed is extensive myositis primarily involving the triceps and coracobrachialis muscles, with a small intrasubstance fluid collection along the proximal tendinous margin of the medial head of the triceps which may represent musculotendinous tear or less likely a very small abscess. This collection is similar to prior. Possibilities include posttraumatic myositis, infection, or a vascular  etiology. Mild reactive  axillary adenopathy on the right may suggest an infectious component. 2. Subcutaneous edema in the upper arm, mildly increased. Cellulitis not excluded. 3. Edema tracks along the superficial margin of the deltoid muscle, teres minor, and infraspinatus as well as the latissimus dorsi. This may be incidental but shear injury cannot be excluded.   Electronically Signed   By: Van Clines M.D.   On: 08/12/2014 08:46   Mr Humerus Right Wo Contrast  08/10/2014   CLINICAL DATA:  Right arm pain after an injury throwing a bag of horse feed 2 weeks ago.  EXAM: MRI OF THE RIGHT HUMERUS WITHOUT CONTRAST  TECHNIQUE: Multiplanar, multisequence MR imaging was performed. No intravenous contrast was administered.  COMPARISON:  MRI dated 08/08/2014 and radiograph dated 05/05/2014  FINDINGS: The edema in the right triceps muscle is even more extensive than on the prior study. The muscle is larger than on the prior study. There is only slight sparing of the proximal aspect of the muscle. Subcutaneous edema is more prominent. The underlying humerus and the neurovascular bundle are normal. The focal area of abnormal signal at the distal mild tendinous junction is unchanged.  IMPRESSION: Progressive edema of the triceps muscle consistent with myositis. Focal area of abnormal signal at the mild tendinous junction could represent a tear or small abscess but this is not progressed. Given the patient's elevated white blood count, infectious myositis must be considered.   Electronically Signed   By: Lorriane Shire M.D.   On: 08/10/2014 08:50   Mr Humerus Right Wo Contrast  08/09/2014   CLINICAL DATA:  Pain throughout entire right humerus for 2 weeks.  EXAM: MRI OF THE RIGHT HUMERUS WITHOUT CONTRAST  TECHNIQUE: Multiplanar, multisequence MR imaging was performed. No intravenous contrast was administered.  COMPARISON:  None.  FINDINGS: No marrow signal abnormality. No fracture or dislocation. There is no bone  destruction or periosteal reaction.  There is severe edema within the right triceps muscle with small areas of sparing proximally. At the distal musculotendinous junction of the medial head of the triceps, there is a complex 2 x 2 x 2.3 cm T2 signal abnormality with central T2 hyperintensity and peripheral intermediate signal. There is mild edema in the adjacent subcutaneous fat.  The biceps musculature is normal. There is no other fluid collection or soft tissue mass. The neurovascular bundles are grossly normal. There are multiple prominent right axillary lymph nodes.  IMPRESSION: 1. Severe edema within the right triceps muscle with small areas of sparing proximally. At the distal musculotendinous junction of the medial head of the triceps, there is a complex 2 x 2 x 2.3 cm T2 signal abnormality. The overall appearance is concerning for myositis which may be secondary to an infectious or inflammatory etiology. The 2 cm area of focal abnormal signal which may reflect a hematoma secondary to a partial tear given the location is at the musculotendinous junction of the medial head of the triceps, but given the extensiveness of the muscle edema, this may reflect a developing abscess versus myonecrosis.   Electronically Signed   By: Kathreen Devoid   On: 08/09/2014 08:31   US Renal  08/10/2014   CLINICAL DATA:  Acute kidney injury.  Elevated creatinine level.  EXAM: RENAL / URINARY TRACT ULTRASOUND COMPLETE  COMPARISON:  CT examination 03/09/2013 and ultrasound 10/04/2008  FINDINGS: Right Kidney:  Length: 10.6 cm. The right kidney parenchyma is echogenic without hydronephrosis. There is a heterogeneous hypoechoic structure along the upper pole measuring up to 1.1  cm. Previously, the patient had an abscess in the right kidney and unclear if this sequela from the abscess or a new small complex lesion.  Left Kidney:  Length: 10.6 cm. Left kidney is not well visualized but appears to be echogenic. Negative for  hydronephrosis.  Bladder:  Appears normal for degree of bladder distention.  IMPRESSION: Negative for hydronephrosis.  Increased echogenicity of both kidneys. Findings raise concern for chronic medical renal disease.  There is a 1.1 cm heterogeneous hypoechoic structure in the right kidney upper pole. Unclear if this is the sequelae of previous renal abscess versus new small complex cystic lesion. Evaluation of this area will be difficult due to acute kidney injury and an elevated creatinine level. However, this may be better characterized with a non contrast MRI versus close surveillance with ultrasound.   Electronically Signed   By: Markus Daft M.D.   On: 08/10/2014 11:34   Ct Femur Right Wo Contrast  08/10/2014   CLINICAL DATA:  Myositis. Sudden onset of pain while lifting a feed bag 2 weeks ago.  EXAM: CT OF THE LOWER RIGHT EXTREMITY WITHOUT CONTRAST  TECHNIQUE: Multidetector CT imaging of the right femur and right tibia fibula was performed according to the standard protocol.  COMPARISON:  None.  FINDINGS: There is no bony abnormality. There is no radiopaque foreign body. There is no soft tissue mass. There is no abscess or drainable fluid collection. There is no soft tissue gas.  There is soft tissue edema around the muscles of the right lower extremity, particularly around the adductor of the via and the quadriceps musculature. This is consistent with the clinically described suspicion of myositis. There is a small knee joint effusion.  IMPRESSION: Soft tissue edema surrounding the musculature of the right lower extremity consistent with myositis. No mass or drainable fluid collection.   Electronically Signed   By: Andreas Newport M.D.   On: 08/10/2014 03:49   Ct Tibia Fibula Right Wo Contrast  08/10/2014   CLINICAL DATA:  Myositis. Sudden onset of pain while lifting a feed bag 2 weeks ago.  EXAM: CT OF THE LOWER RIGHT EXTREMITY WITHOUT CONTRAST  TECHNIQUE: Multidetector CT imaging of the right femur  and right tibia fibula was performed according to the standard protocol.  COMPARISON:  None.  FINDINGS: There is no bony abnormality. There is no radiopaque foreign body. There is no soft tissue mass. There is no abscess or drainable fluid collection. There is no soft tissue gas.  There is soft tissue edema around the muscles of the right lower extremity, particularly around the adductor of the via and the quadriceps musculature. This is consistent with the clinically described suspicion of myositis. There is a small knee joint effusion.  IMPRESSION: Soft tissue edema surrounding the musculature of the right lower extremity consistent with myositis. No mass or drainable fluid collection.   Electronically Signed   By: Andreas Newport M.D.   On: 08/10/2014 03:49   Mr Femur Right Wo Contrast  08/12/2014   CLINICAL DATA:  Myositis of the right arm and right thigh. Status post physical strain 2 weeks ago.  EXAM: MRI OF THE RIGHT FEMUR WITHOUT CONTRAST  TECHNIQUE: Multiplanar, multisequence MR imaging of the right femur was performed. No intravenous contrast was administered.  COMPARISON:  CT right femur 08/10/2014  FINDINGS: There is severe muscle edema within the vastus medialis and intermedius muscle. There is a 14 mm more focal area of fluid signal within the vastus medialis which may reflect  a more focal edema versus a small fluid collection. There is mild muscle edema within the vastus lateralis and rectus femoris. There is severe muscle edema within the sartorius muscle. There is a small amount of fluid along the fascia of the flexor compartment. There is small amount of fluid along the fascia of the flexor compartment and around the adductor compartment. There is no drainable focal fluid collection. There is no hematoma.  There is no focal marrow signal abnormality. There is no fracture or subluxation. No periosteal reaction.  IMPRESSION: 1. Severe muscle edema within the vastus medialis, vastus intermedius  and sartorius muscles most consistent with myositis secondary to an infectious or inflammatory etiology. 14 mm more focal area of fluid signal within the vastus medialis which may reflect a more focal edema versus a small fluid collection. Low level edema within the vastus lateralis and rectus femoris likely reflecting myositis to a much lesser degree.   Electronically Signed   By: Kathreen Devoid   On: 08/12/2014 08:57   Dg Femur, Min 2 Views Right  08/10/2014   CLINICAL DATA:  Right thigh pain. Concern for myositis. Rule out soft tissue air.  EXAM: RIGHT FEMUR 2 VIEWS  COMPARISON:  None.  FINDINGS: Cortical margins of the right femur are intact. No fracture, focal osseous lesion, erosion or periosteal reaction. No soft tissue air or radiopaque foreign body. Questionable soft tissue edema proximally.  IMPRESSION: Suspect mild soft tissue edema proximally. No soft tissue air, radiopaque foreign body, or acute osseous abnormality.   Electronically Signed   By: Jeb Levering M.D.   On: 08/10/2014 01:48     CBC  Recent Labs Lab 08/09/14 1819 08/10/14 0128 08/11/14 0507 08/12/14 0538 08/13/14 0540 08/14/14 0846  WBC 22.6* 18.7* 16.9* 15.0* 15.0* 18.4*  HGB 9.5* 7.8* 7.4* 7.3* 7.3* 8.2*  HCT 28.5* 23.4* 23.0* 22.8* 22.3* 25.1*  PLT 308 242 250 276 291 204  MCV 82.6 83.0 84.2 83.8 82.9 83.9  MCH 27.5 27.7 27.1 26.8 27.1 27.4  MCHC 33.3 33.3 32.2 32.0 32.7 32.7  RDW 13.6 13.6 14.0 13.9 14.0 14.5  LYMPHSABS 1.5  --  1.0  --  0.9  --   MONOABS 1.5*  --  1.5*  --  1.4*  --   EOSABS 0.0  --  0.1  --  0.1  --   BASOSABS 0.0  --  0.1  --  0.1  --     Chemistries   Recent Labs Lab 08/09/14 1819 08/09/14 2350 08/10/14 0128 08/11/14 0507 08/12/14 0538 08/13/14 0342 08/14/14 0846  NA 136  --  135 132* 131* 130* 132*  K 4.5  --  4.0 4.7 4.6 4.8 4.3  CL 105  --  103 105 102 102 104  CO2 22  --  21* 17* 15* 12* 17*  GLUCOSE 68  --  101* 176* 215* 223* 103*  BUN 41*  --  39* 42* 47* 54* 58*   CREATININE 5.46*  --  5.33* 5.39* 5.91* 6.37* 7.33*  CALCIUM 9.0  --  8.2* 8.5* 8.5* 8.4* 8.7*  MG  --  2.0  --   --   --   --   --   AST 28  --  38  --   --   --   --   ALT 15  --  18  --   --   --   --   ALKPHOS 127*  --  166*  --   --   --   --  BILITOT 0.5  --  0.6  --   --   --   --    ------------------------------------------------------------------------------------------------------------------ estimated creatinine clearance is 10.4 mL/min (by C-G formula based on Cr of 7.33). ------------------------------------------------------------------------------------------------------------------ No results for input(s): HGBA1C in the last 72 hours. ------------------------------------------------------------------------------------------------------------------ No results for input(s): CHOL, HDL, LDLCALC, TRIG, CHOLHDL, LDLDIRECT in the last 72 hours. ------------------------------------------------------------------------------------------------------------------  Recent Labs  08/13/14 0342  TSH 17.840*   ------------------------------------------------------------------------------------------------------------------ No results for input(s): VITAMINB12, FOLATE, FERRITIN, TIBC, IRON, RETICCTPCT in the last 72 hours.  Coagulation profile  Recent Labs Lab 08/09/14 2350  INR 1.31    No results for input(s): DDIMER in the last 72 hours.  Cardiac Enzymes No results for input(s): CKMB, TROPONINI, MYOGLOBIN in the last 168 hours.  Invalid input(s): CK ------------------------------------------------------------------------------------------------------------------ Invalid input(s): POCBNP   Time Spent in minutes  35   Hani Campusano K M.D on 08/14/2014 at 11:23 AM  Between 7am to 7pm - Pager - [phone]  After 7pm go to [email or web address] - password [facility]  [name]   Office  [phone]

## 2014-08-14 NOTE — Progress Notes (Signed)
Subjective: Patient is at bedside with family. Patient states her pain is unchanged.  We've reviewed her labs at length. Reviewed her exam at length.  On exam she has no evidence of necrotizing fasciitis or other worsening which is dramatic but simply has not made any significant gains objectively or subjectively.  She has little apetite.  Objective: Vital signs in last 24 hours: Temp:  [98.3 F (36.8 C)-99.5 F (37.5 C)] 99 F (37.2 C) (07/03 0500) Pulse Rate:  [91-101] 93 (07/03 1006) Resp:  [18] 18 (07/03 1006) BP: (152-187)/(74-83) 187/81 mmHg (07/03 1006) SpO2:  [93 %-95 %] 93 % (07/03 1006) Weight:  [68.4 kg (150 lb 12.7 oz)] 68.4 kg (150 lb 12.7 oz) (07/02 2100)  Intake/Output from previous day: 07/02 0701 - 07/03 0700 In: 420 [P.O.:420] Out: -  Intake/Output this shift:     Recent Labs  08/12/14 0538 08/13/14 0540 08/14/14 0846  HGB 7.3* 7.3* 8.2*    Recent Labs  08/13/14 0540 08/14/14 0846  WBC 15.0* 18.4*  RBC 2.69* 2.99*  HCT 22.3* 25.1*  PLT 291 204    Recent Labs  08/13/14 0342 08/14/14 0846  NA 130* 132*  K 4.8 4.3  CL 102 104  CO2 12* 17*  BUN 54* 58*  CREATININE 6.37* 7.33*  GLUCOSE 223* 103*  CALCIUM 8.4* 8.7*   No results for input(s): LABPT, INR in the last 72 hours. Physical exam: Patient's arm is still swollen. The patient has not made any impressive gains in terms of resolution. She has intact flexion extension of the fingers wrist and good pronation supination of the forearm. Unfortunately she still remains highly tender in the posterior compartment of her upper arm. She has no evidence of shoulder girdle pain about the musculature.  I reviewed these issues at length and her findings. Chest is clear she is not short of breath. She is alert awake and appropriate Neurologically intact  Assessment/Plan: Myositis right upper extremity with small area of fluid collection noted on MRI scan which is not worsened but is not improved.  Her clinical exam shows that she does not have any advancing erythema or cellulitis but certainly continues to have tremendous pain which is quite problematic for her.  She is certainly concerning. Given her increase in her white blood cell count and her continued pain as well as the very small fluid collection we have discussed at multiple times during her hospitalization options of biopsy I and D and exploration.  Given the increasing [employer] as well as her failure to respond meaningfully in terms of any improvement I do feel that a muscle biopsy and operative look at this area will be helpful. We will plan for muscle biopsy and ID of the small area of concern including taking cultures to rule out any higher myositis.  I discussed with patient and her family I cannot tell them that this will answer all of the questions but will help [place] with some the questions.  This could certainly be a situation where this is simply a myositis which is stalemated to a certain extent. This would certainly be the best case scenario. However given the increasing white blood cell count and her failure to respond in a [place] fashion over the course of the last 6 days I would certainly feel that a biopsy is appropriate.  I discussed with Dr. Candiss Norse and Dr. Florene Glen of nephrology these issues. we are all in agreement.  We will proceed accordingly.   Colbin Jovel III,Berda Shelvin  M 08/14/2014, 10:38 AM

## 2014-08-15 ENCOUNTER — Inpatient Hospital Stay (HOSPITAL_COMMUNITY): Payer: BLUE CROSS/BLUE SHIELD

## 2014-08-15 DIAGNOSIS — N186 End stage renal disease: Secondary | ICD-10-CM

## 2014-08-15 DIAGNOSIS — E039 Hypothyroidism, unspecified: Secondary | ICD-10-CM

## 2014-08-15 DIAGNOSIS — M60009 Infective myositis, unspecified site: Secondary | ICD-10-CM

## 2014-08-15 DIAGNOSIS — E1069 Type 1 diabetes mellitus with other specified complication: Secondary | ICD-10-CM

## 2014-08-15 LAB — GLUCOSE, CAPILLARY
GLUCOSE-CAPILLARY: 108 mg/dL — AB (ref 65–99)
GLUCOSE-CAPILLARY: 66 mg/dL (ref 65–99)
Glucose-Capillary: 38 mg/dL — CL (ref 65–99)
Glucose-Capillary: 44 mg/dL — CL (ref 65–99)
Glucose-Capillary: 85 mg/dL (ref 65–99)
Glucose-Capillary: 87 mg/dL (ref 65–99)
Glucose-Capillary: 47 mg/dL — ABNORMAL LOW (ref 65–99)
Glucose-Capillary: 66 mg/dL (ref 65–99)
Glucose-Capillary: 65 mg/dL (ref 65–99)
Glucose-Capillary: 85 mg/dL (ref 65–99)
Glucose-Capillary: 68 mg/dL (ref 65–99)

## 2014-08-15 LAB — CULTURE, BLOOD (ROUTINE X 2)
Culture: NO GROWTH
Culture: NO GROWTH

## 2014-08-15 MED ORDER — DEXTROSE 50 % IV SOLN
INTRAVENOUS | Status: AC
  Administered 2014-08-15: 25 mL
  Filled 2014-08-15: qty 50

## 2014-08-15 MED ORDER — LIDOCAINE HCL 1 % IJ SOLN
INTRAMUSCULAR | Status: AC
  Filled 2014-08-15: qty 20

## 2014-08-15 MED ORDER — CLINDAMYCIN PHOSPHATE 300 MG/50ML IV SOLN
300.0000 mg | Freq: Three times a day (TID) | INTRAVENOUS | Status: DC
  Filled 2014-08-15 (×3): qty 50

## 2014-08-15 MED ORDER — INSULIN GLARGINE 100 UNIT/ML ~~LOC~~ SOLN
20.0000 [IU] | Freq: Every day | SUBCUTANEOUS | Status: DC
  Administered 2014-08-15: 20 [IU] via SUBCUTANEOUS
  Filled 2014-08-15 (×2): qty 0.2

## 2014-08-15 MED ORDER — INSULIN ASPART 100 UNIT/ML ~~LOC~~ SOLN: 0.0000 [IU] | Freq: Every day | SUBCUTANEOUS | Status: DC

## 2014-08-15 MED ORDER — INSULIN ASPART 100 UNIT/ML ~~LOC~~ SOLN
0.0000 [IU] | Freq: Three times a day (TID) | SUBCUTANEOUS | Status: DC
  Administered 2014-08-18: 9 [IU] via SUBCUTANEOUS

## 2014-08-15 MED ORDER — SODIUM CHLORIDE 0.9 % IV SOLN
INTRAVENOUS | Status: AC
  Administered 2014-08-16 (×2): via INTRAVENOUS

## 2014-08-15 MED ORDER — DEXTROSE 5 % IV SOLN
500.0000 mg | Freq: Three times a day (TID) | INTRAVENOUS | Status: DC
  Administered 2014-08-15: 500 mg via INTRAVENOUS
  Filled 2014-08-15 (×4): qty 0.5

## 2014-08-15 MED ORDER — CLINDAMYCIN PHOSPHATE 900 MG/50ML IV SOLN
900.0000 mg | Freq: Three times a day (TID) | INTRAVENOUS | Status: DC
Start: 2014-08-15 — End: 2014-08-16
  Administered 2014-08-15 – 2014-08-16 (×2): 900 mg via INTRAVENOUS
  Filled 2014-08-15 (×6): qty 50

## 2014-08-15 NOTE — Procedures (Signed)
R IJ  Trialysis 20 placed  No complication No blood loss. See complete dictation in [facility].

## 2014-08-15 NOTE — Progress Notes (Signed)
PT Cancellation Note  Patient Details Name: Tracy [name] MRN: [id number] DOB: 02-Dec-1985   Cancelled Treatment:    Reason Eval/Treat Not Completed: Patient at procedure or test/unavailable (having line placed will follow up as time permits)   Duncan Dull 08/15/2014, 11:57 AM

## 2014-08-15 NOTE — Progress Notes (Signed)
OT Cancellation Note  Patient Details Name: Tracy [name] MRN: [id number] DOB: 1985-07-24   Cancelled Treatment:    Reason Eval/Treat Not Completed: Fatigue/lethargy limiting ability to participate;Other (comment). Pt's mother and pt continue to be hesitant for her to move unless necessary for going to bathroom. Educated pt/family on benefits of mobility and being OOB. Will re attempt later today as time allows or tomorrow  Britt Bottom 08/15/2014, 2:01 PM

## 2014-08-15 NOTE — Op Note (Signed)
NAMEMATOYA, Tracy [name] NO.:  [id number]  MEDICAL RECORD NO.:  [id number]  LOCATION:  [facility]                         FACILITY:  [facility]  PHYSICIAN:  Satira Anis. Evo Aderman, M.D.DATE OF BIRTH:  [birth year]  DATE OF PROCEDURE: DATE OF DISCHARGE:                              OPERATIVE REPORT   PREOPERATIVE DIAGNOSIS:  Myositis with small fluid collection, right upper arm about the triceps region.  POSTOPERATIVE DIAGNOSES:  Myositis with small fluid collection, right upper arm about the triceps region, with noted very small fluid collection, decompressed, and myositis.  No evidence of necrotic muscle.  PROCEDURE PERFORMED: 1. Irrigation and debridement, deep fluid collection,  posterior left     upper arm at the musculotendinous juncture of the triceps, cultured     for aerobic and anaerobic cultures x2. 2. Fasciotomy, left posterior arm about the triceps region. 3. Deep muscle biopsy, left triceps musculature.  SURGEON:  Satira Anis. Amedeo Plenty, M.D.  ASSISTANT:  None.  COMPLICATIONS:  None.  ANESTHESIA:  General.  TOURNIQUET TIME:  Zero.  INDICATIONS FOR PROCEDURE:  This patient is a challenging 29 year old female, who has the above-mentioned findings.  My notes from her hospital stay as well as notes from the hospitalist and Nephrology have outlined her care and our concerns.  In short, she has had myositis, developed about her upper arm and leg, right side.  An MRI has demonstrated a small fluid collection, which is not worsened over three MRI scans;  nevertheless, it does give cause for concern.  I have discussed with the family that this may be a hematoma associated with her injury when she was throwing a horse feed bag or this may be associated with a fluid collection that is evolving into an infectious process.  Certainly, we would expect for an infectious process to worsen aggressively over the course of observatory days, which have occurred; but obviously we  cannot rule out an infectious etiology, declaring itself via hematogenous seeding.  She has been quite tender, unable to flex the elbow past 45 degrees. Her neurovascular status has remained intact.  She has had intact radial, median, and ulnar nerve sensation and motor function, but has significant tenderness about the posterior aspect of her arm indicative of the myositis event.  Her course is complicated by a chronically low hematocrit, thyroid disease for which she has been noncompliant with her meds, and this certainly bears noting.  She also has very poor kidney function and was pre-renal in terms of the kidney failure issues preoperatively, which have only worsened since she went to [facility] ER it appears.  She has been taking megadoses of ibuprofen prior to our evaluation of the patient.  For many reasons, most notably the failure to improve, the myositis and the small fluid collection with an increased white blood cell count today, we are planning the above-mentioned surgical procedure.  OPERATIVE PROCEDURE:  The patient was seen by myself and Anesthesia, taken to the operative suite, underwent a smooth induction of general anesthesia.  Following this, the patient then underwent sterile Betadine scrub by myself, followed by placement of a U-drape.  After the pre- scrub, she was given a formal Betadine scrub and paint, and  the sterile field was isolated.  Sterile drapes were applied.  Time-out was called. Body parts were well-padded.  Once this was complete, the patient then underwent an incision 2 inches over the point of maximum tenderness and where a small fluid collection on MRI was located.  Dissection was carried through the skin with knife blade.  Subcutaneous tissue was sharply dissected down to the triceps fascia.  I took an intraoperative photo of the triceps muscle.  The muscle did not have any obvious necrosis superficially.  It actually looks fairly healthy  superficially. Once this was noted, I then dissected deeper,  and I certainly encountered the myositis muscle.  This muscle was more flaky and was biopsied.  I took a nice piece of the muscle for biopsy and sent it in formalin to Pathology.  Following this, I then dissected, and I found the small pocket of fluid.  This pocket of fluid was somewhat cloudy. There was no foul smell.  It was difficult to ascertain whether this was liquefying hematoma versus an evolving infectious process.  Thus, I sent to cultures, both aerobic and anaerobic.  I then removed any fibrinous tissue in this area and noted that the muscle did have intact bleeding.  As I have discussed with the family, after the operation, there was no evidence of __________ muscle death or muscle necrosis if you will. There is no evidence of an advanced compartment syndrome, which was also a worry.  Our greatest fears are a horrible infection and pyomyositis as well as compartment syndrome phenomenon.  I did not feel that she had a compartment syndrome to a significant extent as the muscle was stable.  I was happy to  decompress the fluid, and we will await culture results.  I asked Dr. Candiss Norse to continue antibiotics until the culture results are final.  I have discussed with the patient these issues in the postop holding area after we were complete actually.  The patient was irrigated with 3 L of saline and a JP drain was placed exiting distally.  Following this, the wound was closed with a Prolene suture.  The patient did not have any deep sutures placed.  Sterile soft bandage was placed as well as a posterior plaster/fiberglass slab for comfort purposes.  I should note I did range her arm to 120 degrees at the time of surgery without difficulty, once I was able to release the muscle and the pressure from the fibrotic deeper regions.  In essence, this appeared to be a myositis-type condition.  I have discussed all  issues with the patient, and the patient's family, who are available for discussion after the operation, and she was neurovascularly intact in the recovery room.  My plan going forward would be to continue the drainage and monitor the status closely.  All questions have been encouraged and answered.     Satira Anis. Amedeo Plenty, M.D.     [facility]  D:  08/14/2014  T:  08/14/2014  Job:  [id number]

## 2014-08-15 NOTE — Consult Note (Addendum)
[name]ious Disease  Date of Admission:  08/09/2014  Date of Consult:  08/15/2014  Reason for Consult: Pyomyositis Referring Physician: Candiss Norse  Impression/Recommendation Pyomyositis ESRD- acute on chronic DM1 Hypothyroidism  Would Change her anbx to clinda/vanco Await her Cx Check HIV Check TTE  Comment-  Not clear how she got myositis in 2 sites synchronously. She could have gotten an injury, bite and gotten bacteremic.  This does not explain how she progressed on anbx that should have covered staph/strep and she had + Cx after being on anbx for ~5 days. This is explained to pt, family. Her mom asks about C diff. I explained that this is an increase risk from being on clinda. There is no benefit (data) for pro-biotics.   Thank you so much for this truly interesting consult,  My great appreciation to Dr Candiss Norse.   Bobby Rumpf (pager) [phone] [name]  Tracy [name] is an 29 y.o. female.  HPI: 29 yo F with hx of DM1, hypothyroid. She has had several cat bites and scratches on her extremities. She had a wound on her R thenar eminence ~ 1 month ago. She was seen by her PCP and given an ointment for this. She did not feel it was infected.  She then developed R arm and leg pain ~ 2 weeks ago after throwing a horse feed bag. SHe was seen in ED due to persistent pain (6-23) and then had ortho f/u. She then got an MRI which showed myositis and was advised to be admitted. She did not have f/c at home.  She was adm on 6-28 and had temp 99.1 and WBC 22.6. She was started on vanco/aztreonam. She had perisistant pain and had repeat MRI RUE showing:  1. Again observed is extensive myositis primarily involving the triceps and coracobrachialis muscles, with a small intrasubstance fluid collection along the proximal tendinous margin of the medial head of the triceps which may represent musculotendinous tear or less likely a very small abscess. This  collection is similar to prior. Possibilities include posttraumatic myositis, infection, or a vascular etiology. Mild reactive axillary adenopathy on the right may suggest an infectious component. 2. Subcutaneous edema in the upper arm, mildly increased. Cellulitis not excluded. 3. Edema tracks along the superficial margin of the deltoid muscle, teres minor, and infraspinatus as well as the latissimus dorsi. This may be incidental but shear injury cannot be excluded.  SHehwas taken to OR on 7-3 and found to have friable muscle, foul smelling cloudy fluid.  The gram stain from fluid and tissue have all shown GPC.  She had HD catheter placed by IR today.   Past Medical History  Diagnosis Date  . Thyroid enlargement     "not on medication at this time"  . Urinary tract infection     hx of  . Anemia     presently on iron supplement  . Diabetes mellitus     insulin pump   . Hypothyroidism   . Anxiety   . HSV-2 (herpes simplex virus 2) infection   . HSV-1 (herpes simplex virus 1) infection   . Detached retina   . Yeast infection     took diflucan saturday   . Hypertension   . Irregular periods 07/05/2014  . Vaginal discharge 07/05/2014    Past Surgical History  Procedure Laterality Date  . Cholecystectomy    . Pilonidal cyst excision    . Wisdom tooth extraction    . Pars plana vitrectomy  10/01/2011    Procedure: PARS PLANA VITRECTOMY WITH 25 GAUGE;  Surgeon: Hayden Pedro, MD;  Location: [facility];  Service: Ophthalmology;  Laterality: Right;  Repair Complex Traction Retinal Detachment  . Trigger finger release Right 09/21/13     Allergies  Allergen Reactions  . Penicillins Hives and Swelling  . Sulfa Antibiotics Hives    Medications:  Scheduled: . amLODipine  10 mg Oral Daily  . clindamycin (CLEOCIN) IV  900 mg Intravenous Q8H  . famotidine  40 mg Oral BID  . heparin  5,000 Units Subcutaneous 3 times per day  . insulin aspart  0-5 Units Subcutaneous QHS  . insulin  aspart  0-9 Units Subcutaneous TID WC  . insulin glargine  20 Units Subcutaneous Daily  . levothyroxine  125 mcg Oral QAC breakfast  . lidocaine      . metoprolol tartrate  50 mg Oral BID  . multivitamin with minerals  1 tablet Oral Daily  . sodium bicarbonate  1,300 mg Oral BID  . sodium chloride  1,000 mL Intravenous Once  . sodium chloride  3 mL Intravenous Q12H  . vancomycin  1,000 mg Intravenous Q48H    Abtx:  Anti-infectives    Start     Dose/Rate Route Frequency Ordered Stop   08/15/14 1600  clindamycin (CLEOCIN) IVPB 900 mg     900 mg 100 mL/hr over 30 Minutes Intravenous Every 8 hours 08/15/14 1529     08/15/14 1530  clindamycin (CLEOCIN) IVPB 300 mg     300 mg 100 mL/hr over 30 Minutes Intravenous 3 times per day 08/15/14 1522     08/15/14 1430  aztreonam (AZACTAM) 500 mg in dextrose 5 % 50 mL IVPB  Status:  Discontinued     500 mg 100 mL/hr over 30 Minutes Intravenous Every 8 hours 08/15/14 1118 08/15/14 1522   08/11/14 2200  vancomycin (VANCOCIN) IVPB 1000 mg/200 mL premix     1,000 mg 200 mL/hr over 60 Minutes Intravenous Every 48 hours 08/09/14 2256     08/10/14 1045  vancomycin (VANCOCIN) IVPB 750 mg/150 ml premix  Status:  Discontinued     750 mg 150 mL/hr over 60 Minutes Intravenous Every 12 hours 08/09/14 2252 08/09/14 2256   08/09/14 2230  aztreonam (AZACTAM) 1 g in dextrose 5 % 50 mL IVPB  Status:  Discontinued     1 g 100 mL/hr over 30 Minutes Intravenous Every 8 hours 08/09/14 2215 08/15/14 1118   08/09/14 2200  vancomycin (VANCOCIN) IVPB 1000 mg/200 mL premix  Status:  Discontinued     1,000 mg 200 mL/hr over 60 Minutes Intravenous Every 48 hours 08/09/14 2159 08/09/14 2252   08/09/14 2145  vancomycin (VANCOCIN) IVPB 1000 mg/200 mL premix  Status:  Discontinued     1,000 mg 200 mL/hr over 60 Minutes Intravenous  Once 08/09/14 2137 08/09/14 2147      Total days of antibiotics: 6/28 vanc>> 6/28 aztreonam >> 7/4 7/4 clinda          Social History:   reports that she has never smoked. She has never used smokeless tobacco. She reports that she does not drink alcohol or use illicit drugs.  Family History  Problem Relation Age of Onset  . Cancer Paternal Grandfather     prostate  . Hyperlipidemia Paternal Grandfather   . Stroke Paternal Grandfather     General ROS: no cough, no sob, no f/c, no loose BM, no dysuria, see HPI.   Blood pressure 154/81, pulse  94, temperature 98.6 F (37 C), temperature source Oral, resp. rate 15, height '5\' 3"'  (1.6 m), weight 68.4 kg (150 lb 12.7 oz), last menstrual period 07/18/2014, SpO2 92 %. General appearance: alert, cooperative and no distress Eyes: negative findings: conjunctivae and sclerae normal and pupils equal, round, reactive to light and accomodation Throat: lips, mucosa, and tongue normal; teeth and gums normal Neck: no adenopathy and supple, symmetrical, trachea midline Lungs: clear to auscultation bilaterally Heart: regular rate and rhythm Abdomen: normal findings: bowel sounds normal and soft, non-tender Extremities: R thigh- mild swelling, punctate wound on thigh. mild heat. R arm- dressed. small 69m wound on thenar eminence. no d/c. no streaking.  R neck HD line  Results for orders placed or performed during the hospital encounter of 08/09/14 (from the past 48 hour(s))  Glucose, capillary     Status: Abnormal   Collection Time: 08/13/14  4:03 PM  Result Value Ref Range   Glucose-Capillary 133 (H) 65 - 99 mg/dL  Glucose, capillary     Status: None   Collection Time: 08/13/14  8:52 PM  Result Value Ref Range   Glucose-Capillary 72 65 - 99 mg/dL  Glucose, capillary     Status: None   Collection Time: 08/14/14  7:53 AM  Result Value Ref Range   Glucose-Capillary 92 65 - 99 mg/dL  Basic metabolic panel     Status: Abnormal   Collection Time: 08/14/14  8:46 AM  Result Value Ref Range   Sodium 132 (L) 135 - 145 mmol/L   Potassium 4.3 3.5 - 5.1 mmol/L   Chloride 104 101 - 111 mmol/L     CO2 17 (L) 22 - 32 mmol/L   Glucose, Bld 103 (H) 65 - 99 mg/dL   BUN 58 (H) 6 - 20 mg/dL   Creatinine, Ser 7.33 (H) 0.44 - 1.00 mg/dL   Calcium 8.7 (L) 8.9 - 10.3 mg/dL   GFR calc non Af Amer 7 (L) >60 mL/min   GFR calc Af Amer 8 (L) >60 mL/min    Comment: (NOTE) The eGFR has been calculated using the CKD EPI equation. This calculation has not been validated in all clinical situations. eGFR's persistently <60 mL/min signify possible Chronic Kidney Disease.    Anion gap 11 5 - 15  CBC     Status: Abnormal   Collection Time: 08/14/14  8:46 AM  Result Value Ref Range   WBC 18.4 (H) 4.0 - 10.5 K/uL   RBC 2.99 (L) 3.87 - 5.11 MIL/uL   Hemoglobin 8.2 (L) 12.0 - 15.0 g/dL   HCT 25.1 (L) 36.0 - 46.0 %   MCV 83.9 78.0 - 100.0 fL   MCH 27.4 26.0 - 34.0 pg   MCHC 32.7 30.0 - 36.0 g/dL   RDW 14.5 11.5 - 15.5 %   Platelets 204 150 - 400 K/uL  Phosphorus     Status: Abnormal   Collection Time: 08/14/14  8:46 AM  Result Value Ref Range   Phosphorus 6.5 (H) 2.5 - 4.6 mg/dL  Glucose, capillary     Status: None   Collection Time: 08/14/14 12:38 PM  Result Value Ref Range   Glucose-Capillary 71 65 - 99 mg/dL  Anaerobic culture     Status: None (Preliminary result)   Collection Time: 08/14/14  2:32 PM  Result Value Ref Range   Specimen Description FLUID    Special Requests RIGHT ARM FLUID A    Gram Stain      MODERATE WBC PRESENT,BOTH PMN AND MONONUCLEAR  NO SQUAMOUS EPITHELIAL CELLS SEEN RARE GRAM POSITIVE COCCI IN PAIRS IN CLUSTERS Performed at [facility]    Culture      NO ANAEROBES ISOLATED; CULTURE IN PROGRESS FOR 5 DAYS Performed at [facility]    Report Status PENDING   Culture, routine-abscess     Status: None (Preliminary result)   Collection Time: 08/14/14  2:32 PM  Result Value Ref Range   Specimen Description ARM RIGHT    Special Requests NONE    Gram Stain      FEW WBC PRESENT, PREDOMINANTLY PMN NO SQUAMOUS EPITHELIAL CELLS SEEN RARE GRAM  POSITIVE COCCI IN CLUSTERS Performed at [facility]    Culture NO GROWTH Performed at [facility]     Report Status PENDING   Anaerobic culture     Status: None (Preliminary result)   Collection Time: 08/14/14  2:36 PM  Result Value Ref Range   Specimen Description TISSUE    Special Requests RIGHT ARM B    Gram Stain      NO WBC SEEN NO SQUAMOUS EPITHELIAL CELLS SEEN RARE GRAM POSITIVE COCCI IN PAIRS IN CLUSTERS Performed at [facility]    Culture      NO ANAEROBES ISOLATED; CULTURE IN PROGRESS FOR 5 DAYS Performed at [facility]    Report Status PENDING   Tissue culture     Status: None (Preliminary result)   Collection Time: 08/14/14  2:36 PM  Result Value Ref Range   Specimen Description TISSUE    Special Requests RIGHT ARM B    Gram Stain      RARE WBC PRESENT, PREDOMINANTLY PMN NO SQUAMOUS EPITHELIAL CELLS SEEN RARE GRAM POSITIVE COCCI IN PAIRS Performed at [facility]    Culture NO GROWTH Performed at [facility]     Report Status PENDING   Glucose, capillary     Status: None   Collection Time: 08/14/14  3:34 PM  Result Value Ref Range   Glucose-Capillary 68 65 - 99 mg/dL   Comment 1 Notify RN    Comment 2 Repeat Test    Comment 3 Document in Chart   Glucose, capillary     Status: Abnormal   Collection Time: 08/14/14  4:20 PM  Result Value Ref Range   Glucose-Capillary 106 (H) 65 - 99 mg/dL   Comment 1 Document in Chart   Glucose, capillary     Status: None   Collection Time: 08/14/14  9:28 PM  Result Value Ref Range   Glucose-Capillary 93 65 - 99 mg/dL  Glucose, capillary     Status: Abnormal   Collection Time: 08/15/14  7:45 AM  Result Value Ref Range   Glucose-Capillary 38 (LL) 65 - 99 mg/dL   Comment 1 Notify RN   Glucose, capillary     Status: Abnormal   Collection Time: 08/15/14  8:36 AM  Result Value Ref Range   Glucose-Capillary 44 (LL) 65 - 99 mg/dL  Glucose, capillary     Status:  None   Collection Time: 08/15/14  9:18 AM  Result Value Ref Range   Glucose-Capillary 85 65 - 99 mg/dL  Glucose, capillary     Status: Abnormal   Collection Time: 08/15/14 11:30 AM  Result Value Ref Range   Glucose-Capillary 108 (H) 65 - 99 mg/dL  Glucose, capillary     Status: None   Collection Time: 08/15/14  1:31 PM  Result Value Ref Range   Glucose-Capillary 87 65 - 99  mg/dL      Component Value Date/Time   SDES TISSUE 08/14/2014 1436   SDES TISSUE 08/14/2014 1436   SPECREQUEST RIGHT ARM B 08/14/2014 1436   SPECREQUEST RIGHT ARM B 08/14/2014 1436   CULT  08/14/2014 1436    NO ANAEROBES ISOLATED; CULTURE IN PROGRESS FOR 5 DAYS Performed at [facility] NO GROWTH Performed at [facility]  08/14/2014 1436   REPTSTATUS PENDING 08/14/2014 1436   REPTSTATUS PENDING 08/14/2014 1436   Ir Fluoro Guide Cv Line Right  08/15/2014   CLINICAL DATA:  Diabetes, acute renal insufficiency  EXAM: EXAM RIGHT IJ CATHETER PLACEMENT UNDER ULTRASOUND AND FLUOROSCOPIC GUIDANCE  TECHNIQUE: The procedure, risks (including but not limited to bleeding, infection, organ damage, pneumothorax), benefits, and alternatives were explained to the patient. Questions regarding the procedure were encouraged and answered. The patient understands and consents to the procedure. Patency of the right IJ vein was confirmed with ultrasound with image documentation. An appropriate skin site was determined. Skin site was marked. Region was prepped using maximum barrier technique including cap and mask, sterile gown, sterile gloves, large sterile sheet, and Chlorhexidine as cutaneous antisepsis. The region was infiltrated locally with 1% lidocaine. Under real-time ultrasound guidance, the right IJ vein was accessed with a 19 gauge needle; the needle tip within the vein was confirmed with ultrasound image documentation. The needle exchanged over a guidewire for vascular dilator which allowed advancement of a  20 cm Trialysis catheter. This was positioned with the tip at the cavoatrial junction. Spot chest radiograph shows good positioning and no pneumothorax. Catheter was flushed and sutured externally with 0-Prolene sutures. Patient tolerated the procedure well.  FLUOROSCOPY TIME:  1.1 mGy, 6 seconds  COMPLICATIONS: COMPLICATIONS none  IMPRESSION: 1. Technically successful right IJ Trialysis catheter placement.   Electronically Signed   By: Lucrezia Europe M.D.   On: 08/15/2014 11:00   Ir US Guide Vasc Access Right  08/15/2014   CLINICAL DATA:  Diabetes, acute renal insufficiency  EXAM: EXAM RIGHT IJ CATHETER PLACEMENT UNDER ULTRASOUND AND FLUOROSCOPIC GUIDANCE  TECHNIQUE: The procedure, risks (including but not limited to bleeding, infection, organ damage, pneumothorax), benefits, and alternatives were explained to the patient. Questions regarding the procedure were encouraged and answered. The patient understands and consents to the procedure. Patency of the right IJ vein was confirmed with ultrasound with image documentation. An appropriate skin site was determined. Skin site was marked. Region was prepped using maximum barrier technique including cap and mask, sterile gown, sterile gloves, large sterile sheet, and Chlorhexidine as cutaneous antisepsis. The region was infiltrated locally with 1% lidocaine. Under real-time ultrasound guidance, the right IJ vein was accessed with a 19 gauge needle; the needle tip within the vein was confirmed with ultrasound image documentation. The needle exchanged over a guidewire for vascular dilator which allowed advancement of a 20 cm Trialysis catheter. This was positioned with the tip at the cavoatrial junction. Spot chest radiograph shows good positioning and no pneumothorax. Catheter was flushed and sutured externally with 0-Prolene sutures. Patient tolerated the procedure well.  FLUOROSCOPY TIME:  1.1 mGy, 6 seconds  COMPLICATIONS: COMPLICATIONS none  IMPRESSION: 1. Technically  successful right IJ Trialysis catheter placement.   Electronically Signed   By: Lucrezia Europe M.D.   On: 08/15/2014 11:00   Recent Results (from the past 240 hour(s))  Urine culture     Status: None   Collection Time: 08/09/14  1:57 PM  Result Value Ref Range Status  Specimen Description URINE, CLEAN CATCH  Final   Special Requests NONE  Final   Culture >=100,000 COLONIES/mL LACTOBACILLUS SPECIES  Final   Report Status 08/11/2014 FINAL  Final  Culture, blood (x 2)     Status: None   Collection Time: 08/09/14 11:42 PM  Result Value Ref Range Status   Specimen Description BLOOD LEFT HAND  Final   Special Requests BOTTLES DRAWN AEROBIC ONLY [place]  Final   Culture NO GROWTH 5 DAYS  Final   Report Status 08/15/2014 FINAL  Final  Culture, blood (x 2)     Status: None   Collection Time: 08/09/14 11:50 PM  Result Value Ref Range Status   Specimen Description BLOOD RIGHT HAND  Final   Special Requests   Final    BOTTLES DRAWN AEROBIC AND ANAEROBIC [place] BLUE 5CC PURPLE   Culture NO GROWTH 5 DAYS  Final   Report Status 08/15/2014 FINAL  Final  Anaerobic culture     Status: None (Preliminary result)   Collection Time: 08/14/14  2:32 PM  Result Value Ref Range Status   Specimen Description FLUID  Final   Special Requests RIGHT ARM FLUID A  Final   Gram Stain   Final    MODERATE WBC PRESENT,BOTH PMN AND MONONUCLEAR NO SQUAMOUS EPITHELIAL CELLS SEEN RARE GRAM POSITIVE COCCI IN PAIRS IN CLUSTERS Performed at [facility]    Culture   Final    NO ANAEROBES ISOLATED; CULTURE IN PROGRESS FOR 5 DAYS Performed at [facility]    Report Status PENDING  Incomplete  Culture, routine-abscess     Status: None (Preliminary result)   Collection Time: 08/14/14  2:32 PM  Result Value Ref Range Status   Specimen Description ARM RIGHT  Final   Special Requests NONE  Final   Gram Stain   Final    FEW WBC PRESENT, PREDOMINANTLY PMN NO SQUAMOUS EPITHELIAL CELLS SEEN RARE GRAM POSITIVE  COCCI IN CLUSTERS Performed at [facility]    Culture NO GROWTH Performed at [facility]   Final   Report Status PENDING  Incomplete  Anaerobic culture     Status: None (Preliminary result)   Collection Time: 08/14/14  2:36 PM  Result Value Ref Range Status   Specimen Description TISSUE  Final   Special Requests RIGHT ARM B  Final   Gram Stain   Final    NO WBC SEEN NO SQUAMOUS EPITHELIAL CELLS SEEN RARE GRAM POSITIVE COCCI IN PAIRS IN CLUSTERS Performed at [facility]    Culture   Final    NO ANAEROBES ISOLATED; CULTURE IN PROGRESS FOR 5 DAYS Performed at [facility]    Report Status PENDING  Incomplete  Tissue culture     Status: None (Preliminary result)   Collection Time: 08/14/14  2:36 PM  Result Value Ref Range Status   Specimen Description TISSUE  Final   Special Requests RIGHT ARM B  Final   Gram Stain   Final    RARE WBC PRESENT, PREDOMINANTLY PMN NO SQUAMOUS EPITHELIAL CELLS SEEN RARE GRAM POSITIVE COCCI IN PAIRS Performed at [facility]    Culture NO GROWTH Performed at [facility]   Final   Report Status PENDING  Incomplete      08/15/2014, 3:30 PM     LOS: 6 days

## 2014-08-15 NOTE — Progress Notes (Signed)
Hypoglycemic Event  CBG: 47  Treatment: 15 GM carbohydrate snack  Symptoms: None  Follow-up CBG: Time:1715 CBG Result: 66  Possible Reasons for Event: Inadequate meal intake  Comments/MD notified: pt given snacks and a soda as was the case in the A.M. Blood sugar increased to 66 and pt agreed to eat more carbs.    Lenise Herald R  Remember to initiate Hypoglycemia Order Set & complete

## 2014-08-15 NOTE — Progress Notes (Addendum)
ANTIBIOTIC CONSULT NOTE - FOLLOW UP  Pharmacy Consult for Vancomycin/Clindamycin Indication: r/o infection in R arm/thigh  Allergies  Allergen Reactions  . Penicillins Hives and Swelling  . Sulfa Antibiotics Hives    Patient Measurements: Height: 5\' 3"  (160 cm) Weight: 150 lb 12.7 oz (68.4 kg) IBW/kg (Calculated) : 52.4 Adjusted Body Weight:    Vital Signs: Temp: 98.6 F (37 C) (07/04 0947) Temp Source: Oral (07/04 0947) BP: 154/81 mmHg (07/04 0947) Pulse Rate: 94 (07/04 0947) Intake/Output from previous day: 07/03 0701 - 07/04 0700 In: 1275 [P.O.:180; I.V.:1095] Out: 180 [Urine:150; Drains:30] Intake/Output from this shift:    Labs:  Recent Labs  08/13/14 0342 08/13/14 0540 08/14/14 0846  WBC  --  15.0* 18.4*  HGB  --  7.3* 8.2*  PLT  --  291 204  CREATININE 6.37*  --  7.33*   Estimated Creatinine Clearance: 10.4 mL/min (by C-G formula based on Cr of 7.33). No results for input(s): VANCOTROUGH, VANCOPEAK, VANCORANDOM, GENTTROUGH, GENTPEAK, GENTRANDOM, TOBRATROUGH, TOBRAPEAK, TOBRARND, AMIKACINPEAK, AMIKACINTROU, AMIKACIN in the last 72 hours.    Assessment: 29yo female with history of hypothyroidism, DM, anemia, and HTN presents from [facility] with myositis of R arm and R thigh. Pharmacy is consulted to dose vancomycin and aztreonam for myonecrosis.  - Poor DM and Synthroid compliance PTA  ID: Abx for myositis/myonecrosis or RUE and RLE, WBC 18.4, AF. Day #6 abx.  6/28 vanc>> 6/28 aztreonam >>  6/28 urine cx: >100,000 lactobacillus 6/28 blood cx: GPC 7/3: Tissue/Abscess x 2: GPC  Patient's renal function is poor with the possible need for HD at some point this admission.  Goal of Therapy:  Vancomycin trough level 10-15 mcg/ml  Plan:  -Continue Vancomycin 1g IV q48h -Discontinue aztreonam -Initiate clindamycin 900 mg IV q8h -VT at 2130 -F/U culture results, clinical status, need for HD  Levester Fresh, PharmD, BCPS Clinical  Pharmacist Pager [phone] 08/15/2014 11:19 AM

## 2014-08-15 NOTE — Progress Notes (Signed)
Hypoglycemic Event  CBG: 37   Treatment: peanut butter crackers, 4 oz apple juice  Symptoms: None  Follow-up CBG: Time: 0845 CBG Result: 44  Possible Reasons for Event: Inadequate meal intake and Change in activity  Comments/MD notified: Patient alert and oriented during hypoglycemic event and able to take oral foods. Repeat CBG 44, half an amp of Dextrose 50% given IV. Patient continues to be asymptomatic. Dr. Candiss Norse made aware and adjusted insulin orders. Will continue to monitor.     Tracy [name]  Remember to initiate Hypoglycemia Order Set & complete

## 2014-08-15 NOTE — Progress Notes (Signed)
Patient refusing to watch HD videos at this time. Patient encouraged to watch videos at some point today.

## 2014-08-15 NOTE — Progress Notes (Signed)
Patient ID: Tracy [name], female   DOB: 1985-04-27, 29 y.o.   MRN: [id number]  [facility] Progress Note    Assessment/ Plan:   1. Acute renal failure on chronic kidney disease stage IV: Suspected to be hemodynamically mediated versus multifactorial ATN including NSAIDs. Noted to be having uremic symptoms emerge yesterday and planned for dialysis catheter placement by radiology-discussed with IR, temporary catheter placement at this time is preferable due to her leukocytosis. Hemodialysis today-unclear if she is now ESRD 2. Myositis right arm/right thigh-status post open biopsy of right arm and placement of JP drain by surgery. 3. Anemia: Secondary to infection/ESA resistance and some surgical losses-continue to trend hemoglobin levels for possible need to transfuse PRBCs 4. Hyponatremia: Most likely due to acute renal failure and defective free water handling-monitor with dialysis  Subjective:   In some pain from arm surgery and HD catheter- anxious about HD   Objective:   BP 154/81 mmHg  Pulse 94  Temp(Src) 98.6 F (37 C) (Oral)  Resp 15  Ht 5\' 3"  (1.6 m)  Wt 68.4 kg (150 lb 12.7 oz)  BMI 26.72 kg/m2  SpO2 92%  LMP 07/18/2014  Intake/Output Summary (Last 24 hours) at 08/15/14 1049 Last data filed at 08/15/14 0600  Gross per 24 hour  Intake   1275 ml  Output    180 ml  Net   1095 ml   Weight change:   Physical Exam: Gen: Uncomfortable resting in bed CVS: Pulse regular in rate and rhythm, S1 and S2 normal Resp: Clear to auscultation, no rales Abd: Soft, flat, nontender Ext: Tender right arm/right thigh with edema  Imaging: No results found.  Labs: BMET  Recent Labs Lab 08/09/14 1819 08/09/14 2350 08/10/14 0128 08/11/14 0507 08/12/14 0538 08/13/14 0342 08/14/14 0846  NA 136  --  135 132* 131* 130* 132*  K 4.5  --  4.0 4.7 4.6 4.8 4.3  CL 105  --  103 105 102 102 104  CO2 22  --  21* 17* 15* 12* 17*  GLUCOSE 68  --  101* 176* 215* 223*  103*  BUN 41*  --  39* 42* 47* 54* 58*  CREATININE 5.46*  --  5.33* 5.39* 5.91* 6.37* 7.33*  CALCIUM 9.0  --  8.2* 8.5* 8.5* 8.4* 8.7*  PHOS  --  4.8*  --   --  6.0* 6.4* 6.5*   CBC  Recent Labs Lab 08/09/14 1819  08/11/14 0507 08/12/14 0538 08/13/14 0540 08/14/14 0846  WBC 22.6*  < > 16.9* 15.0* 15.0* 18.4*  NEUTROABS 19.5*  --  14.2*  --  12.6*  --   HGB 9.5*  < > 7.4* 7.3* 7.3* 8.2*  HCT 28.5*  < > 23.0* 22.8* 22.3* 25.1*  MCV 82.6  < > 84.2 83.8 82.9 83.9  PLT 308  < > 250 276 291 204  < > = values in this interval not displayed.  Medications:    . amLODipine  10 mg Oral Daily  . aztreonam  1 g Intravenous Q8H  . famotidine  40 mg Oral BID  . heparin  5,000 Units Subcutaneous 3 times per day  . insulin aspart  0-5 Units Subcutaneous QHS  . insulin aspart  0-9 Units Subcutaneous TID WC  . insulin glargine  20 Units Subcutaneous Daily  . levothyroxine  125 mcg Oral QAC breakfast  . lidocaine      . metoprolol tartrate  50 mg Oral BID  . multivitamin with minerals  1 tablet Oral Daily  . sodium bicarbonate  1,300 mg Oral BID  . sodium chloride  1,000 mL Intravenous Once  . sodium chloride  3 mL Intravenous Q12H  . vancomycin  1,000 mg Intravenous Q48H   Elmarie Shiley, MD 08/15/2014, 10:49 AM '

## 2014-08-15 NOTE — Progress Notes (Addendum)
Patient Demographics:    Tracy [name], is a 29 y.o. female, DOB - 27-Jun-1985, [id number]  Admit date - 08/09/2014   Admitting Physician Ivor Costa, MD  Outpatient Primary MD for the patient is [name]  LOS - 6   Chief Complaint  Patient presents with  . Arm Pain        Subjective:    Tracy [name] today has, No headache, No chest pain, No abdominal pain - No Nausea, No new weakness tingling or numbness, No Cough - SOB. Continues to have right arm and right thigh pain.   Assessment  & Plan :     1. Myositis of the right arm and right thigh. Most likely post physical strain 2 weeks ago while throwing horse feed. Case discussed with rheumatologist Dr. Amil Amen over the phone on the day of admission and then on 08/14/2014, ANA is negative, aldolase unremarkable 2, CK is mildly elevated but has normalized on follow-up. Also being seen and followed by general surgery, hand surgery and orthopedics both on board.   Ultrasound of the right upper arm does not show a clot. Has received IV anti-biotics adequately since admission as infection was not completely ruled out, on 08/14/2014 she continues to up trend her leukocytosis, case was discussed in detail with Dr. Amil Amen rheumatologist,   She is post right arm muscle biopsy, fluid drainage and JP drain placement on 08/14/2014 by hand surgery, per hand surgery and continue anti-biotics, so far cultures growing gram-positive cocci in clusters. On vancomycin since admission will be continued add Clinda stop Aztreonam, ID consult.. Source for her seeding is unclear, will check a transthoracic echocardiogram and monitor culture sensitivity.    Per Dr. Amil Amen hold steroids still biopsy shows inflammation or a need for steroids use.   In my  opinion her myositis is likely initiated by recent physical stress of moving the horse feed predisposed and worsened by uncontrolled hypothyroidism and diabetes. Clinically she has shown improvement over the last few days and continues to feel better. Continue supportive care.      2. Hypothyroidism. Was not taking Synthroid for the last couple of months, counseled on compliance, TSH was 25, Synthroid resumed monitorinf TSH is gradually improving.    3. Anemia - iron deficiency on into anemia panel, IV and replaced. Monitor H&H. Transfuse if hemoglobin drops below 7. Outpatient workup for iron deficiency anemia in a menstruating female as appropriate.    4.ARF of CKD 4 - baseline creatinine of 3, had some nausea vomiting and NSAID use prior to admission, likely ATN. Avoid nephrotoxins, continue hydration, renal ultrasound shows cyst but no obstruction, urine output stable, Renal following. May require dialysis this admission.    5. DM1 - poor outpatient control with A1c of 8.9 , on Lantus dose adjusted again along with her ISS,  monitor CBGs.  CBG (last 3)   Recent Labs  08/15/14 0836 08/15/14 0918 08/15/14 1130  GLUCAP 44* 85 108*    Lab Results  Component Value Date   HGBA1C 8.9* 08/10/2014     6. UA appears clean with 0-2 WBCs ruling out UTI, cultures growing lactobacilli which likely is contamination from vaginal flora. Monitor clinically.     Code Status : Full  Family Communication  :  Mother bedside, boyfriend bedside  Disposition Plan  : Home   Consults  :  Orthopedics, hand surgery, general surgery, Renal, rheumatologist Dr. Amil Amen over the phone 2, ID  Procedures  :   R Arm - Muscle biopsy, fluid drainage and JP drain placement 08-14-14 by Dr Amedeo Plenty.  Right upper extremity venous duplex. No DVT, MRI right arm and right leg x 2, CT right thigh  DVT Prophylaxis  :   Heparin    Lab Results  Component Value Date   PLT 204 08/14/2014    Inpatient  Medications  Scheduled Meds: . amLODipine  10 mg Oral Daily  . aztreonam  500 mg Intravenous Q8H  . famotidine  40 mg Oral BID  . heparin  5,000 Units Subcutaneous 3 times per day  . insulin aspart  0-5 Units Subcutaneous QHS  . insulin aspart  0-9 Units Subcutaneous TID WC  . insulin glargine  20 Units Subcutaneous Daily  . levothyroxine  125 mcg Oral QAC breakfast  . lidocaine      . metoprolol tartrate  50 mg Oral BID  . multivitamin with minerals  1 tablet Oral Daily  . sodium bicarbonate  1,300 mg Oral BID  . sodium chloride  1,000 mL Intravenous Once  . sodium chloride  3 mL Intravenous Q12H  . vancomycin  1,000 mg Intravenous Q48H   Continuous Infusions: . sodium chloride 100 mL/hr at 08/15/14 0908   PRN Meds:.acetaminophen **OR** acetaminophen, alum & mag hydroxide-simeth, hydrALAZINE, HYDROcodone-acetaminophen, HYDROmorphone (DILAUDID) injection, ondansetron, zolpidem  Antibiotics  :     Anti-infectives    Start     Dose/Rate Route Frequency Ordered Stop   08/15/14 1430  aztreonam (AZACTAM) 500 mg in dextrose 5 % 50 mL IVPB     500 mg 100 mL/hr over 30 Minutes Intravenous Every 8 hours 08/15/14 1118     08/11/14 2200  vancomycin (VANCOCIN) IVPB 1000 mg/200 mL premix     1,000 mg 200 mL/hr over 60 Minutes Intravenous Every 48 hours 08/09/14 2256     08/10/14 1045  vancomycin (VANCOCIN) IVPB 750 mg/150 ml premix  Status:  Discontinued     750 mg 150 mL/hr over 60 Minutes Intravenous Every 12 hours 08/09/14 2252 08/09/14 2256   08/09/14 2230  aztreonam (AZACTAM) 1 g in dextrose 5 % 50 mL IVPB  Status:  Discontinued     1 g 100 mL/hr over 30 Minutes Intravenous Every 8 hours 08/09/14 2215 08/15/14 1118   08/09/14 2200  vancomycin (VANCOCIN) IVPB 1000 mg/200 mL premix  Status:  Discontinued     1,000 mg 200 mL/hr over 60 Minutes Intravenous Every 48 hours 08/09/14 2159 08/09/14 2252   08/09/14 2145  vancomycin (VANCOCIN) IVPB 1000 mg/200 mL premix  Status:  Discontinued      1,000 mg 200 mL/hr over 60 Minutes Intravenous  Once 08/09/14 2137 08/09/14 2147        Objective:   Filed Vitals:   08/14/14 2125 08/15/14 0400 08/15/14 0505 08/15/14 0947  BP: 168/78 172/74 147/79 154/81  Pulse: 107 97 94 94  Temp: 99.7 F (37.6 C) 99.8 F (37.7 C)  98.6 F (37 C)  TempSrc: Oral Oral Oral Oral  Resp: 16 16  15   Height:      Weight:      SpO2: 92% 92%  92%    Wt Readings from Last 3 Encounters:  08/13/14 68.4 kg (150 lb 12.7 oz)  08/08/14 68.493 kg (151 lb)  08/05/14 68.493 kg (  151 lb)     Intake/Output Summary (Last 24 hours) at 08/15/14 1205 Last data filed at 08/15/14 0600  Gross per 24 hour  Intake   1275 ml  Output    180 ml  Net   1095 ml     Physical Exam  Awake Alert, Oriented X 3, No new F.N deficits, Normal affect [place].AT,PERRAL Supple Neck,No JVD, No cervical lymphadenopathy appriciated.  Symmetrical Chest wall movement, Good air movement bilaterally, CTAB RRR,No Gallops,Rubs or new Murmurs, No Parasternal Heave +ve B.Sounds, Abd Soft, No tenderness, No organomegaly appriciated, No rebound - guarding or rigidity. No Cyanosis, Clubbing or edema, No new Rash or bruise  Swelling in the right arm and right thigh, some tenderness, no warmth, no signs of compartment syndrome    Data Review:   Micro Results Recent Results (from the past 240 hour(s))  Urine culture     Status: None   Collection Time: 08/09/14  1:57 PM  Result Value Ref Range Status   Specimen Description URINE, CLEAN CATCH  Final   Special Requests NONE  Final   Culture >=100,000 COLONIES/mL LACTOBACILLUS SPECIES  Final   Report Status 08/11/2014 FINAL  Final  Culture, blood (x 2)     Status: None (Preliminary result)   Collection Time: 08/09/14 11:42 PM  Result Value Ref Range Status   Specimen Description BLOOD LEFT HAND  Final   Special Requests BOTTLES DRAWN AEROBIC ONLY [place]  Final   Culture NO GROWTH 4 DAYS  Final   Report Status PENDING  Incomplete   Culture, blood (x 2)     Status: None (Preliminary result)   Collection Time: 08/09/14 11:50 PM  Result Value Ref Range Status   Specimen Description BLOOD RIGHT HAND  Final   Special Requests   Final    BOTTLES DRAWN AEROBIC AND ANAEROBIC [place] BLUE 5CC PURPLE   Culture NO GROWTH 4 DAYS  Final   Report Status PENDING  Incomplete  Anaerobic culture     Status: None (Preliminary result)   Collection Time: 08/14/14  2:32 PM  Result Value Ref Range Status   Specimen Description FLUID  Final   Special Requests RIGHT ARM FLUID A  Final   Gram Stain   Final    MODERATE WBC PRESENT,BOTH PMN AND MONONUCLEAR NO SQUAMOUS EPITHELIAL CELLS SEEN RARE GRAM POSITIVE COCCI IN PAIRS IN CLUSTERS Performed at [facility]    Culture PENDING  Incomplete   Report Status PENDING  Incomplete  Culture, routine-abscess     Status: None (Preliminary result)   Collection Time: 08/14/14  2:32 PM  Result Value Ref Range Status   Specimen Description ARM RIGHT  Final   Special Requests NONE  Final   Gram Stain   Final    FEW WBC PRESENT, PREDOMINANTLY PMN NO SQUAMOUS EPITHELIAL CELLS SEEN RARE GRAM POSITIVE COCCI IN CLUSTERS Performed at [facility]    Culture NO GROWTH Performed at [facility]   Final   Report Status PENDING  Incomplete  Anaerobic culture     Status: None (Preliminary result)   Collection Time: 08/14/14  2:36 PM  Result Value Ref Range Status   Specimen Description TISSUE  Final   Special Requests RIGHT ARM B  Final   Gram Stain   Final    NO WBC SEEN NO SQUAMOUS EPITHELIAL CELLS SEEN RARE GRAM POSITIVE COCCI IN PAIRS IN CLUSTERS Performed at [facility]    Culture PENDING  Incomplete   Report  Status PENDING  Incomplete  Tissue culture     Status: None (Preliminary result)   Collection Time: 08/14/14  2:36 PM  Result Value Ref Range Status   Specimen Description TISSUE  Final   Special Requests RIGHT ARM B  Final   Gram Stain   Final     RARE WBC PRESENT, PREDOMINANTLY PMN NO SQUAMOUS EPITHELIAL CELLS SEEN RARE GRAM POSITIVE COCCI IN PAIRS Performed at [facility]    Culture NO GROWTH Performed at [facility]   Final   Report Status PENDING  Incomplete    Radiology Reports Dg Elbow Complete Right  08/05/2014   CLINICAL DATA:  Right elbow pain that radiates to the shoulder. Pain started after picking up horse feed.  EXAM: RIGHT ELBOW - COMPLETE 3+ VIEW  COMPARISON:  None.  FINDINGS: There is no evidence of fracture, dislocation, or joint effusion. There is no evidence of arthropathy or other focal bone abnormality. Soft tissues are unremarkable.  IMPRESSION: Negative.   Electronically Signed   By: Markus Daft M.D.   On: 08/05/2014 08:05   Mr Humerus Right Wo Contrast  08/12/2014   CLINICAL DATA:  Myositis in the right arm.  Injury 2 weeks ago.  EXAM: MRI OF THE RIGHT HUMERUS WITHOUT CONTRAST  TECHNIQUE: Multiplanar, multisequence MR imaging was performed. No intravenous contrast was administered.  COMPARISON:  08/10/2014  FINDINGS: Dependent atelectasis in the right lower lobe.  There is considerable abnormal edema in the lateral, long, and medial head of the triceps diffusely. The internal tendon of the medial head seems torn on images 20 through 23 of series 7, with an associated 2.2 by 0.6 by 1.4 cm fluid collection along the proximal margin of the tendon.  There is edema in the coracobrachialis muscle tendon a small medial portion of the brachialis muscle.  Left axillary lymph nodes measure up to 1.2 cm in short axis. Edema tracks along the axillary neurovascular structures.  Fluid signal tracks superficial to the posterior deltoid muscle and superficial to the latissimus dorsi and infraspinatus and teres minor muscles.  Subcutaneous edema tracks in the upper arm circumferentially, somewhat more confluent anteriorly.  Small elbow joint effusion.  No significant abnormal osseous edema.  IMPRESSION: 1. Again  observed is extensive myositis primarily involving the triceps and coracobrachialis muscles, with a small intrasubstance fluid collection along the proximal tendinous margin of the medial head of the triceps which may represent musculotendinous tear or less likely a very small abscess. This collection is similar to prior. Possibilities include posttraumatic myositis, infection, or a vascular etiology. Mild reactive axillary adenopathy on the right may suggest an infectious component. 2. Subcutaneous edema in the upper arm, mildly increased. Cellulitis not excluded. 3. Edema tracks along the superficial margin of the deltoid muscle, teres minor, and infraspinatus as well as the latissimus dorsi. This may be incidental but shear injury cannot be excluded.   Electronically Signed   By: Van Clines M.D.   On: 08/12/2014 08:46   Mr Humerus Right Wo Contrast  08/10/2014   CLINICAL DATA:  Right arm pain after an injury throwing a bag of horse feed 2 weeks ago.  EXAM: MRI OF THE RIGHT HUMERUS WITHOUT CONTRAST  TECHNIQUE: Multiplanar, multisequence MR imaging was performed. No intravenous contrast was administered.  COMPARISON:  MRI dated 08/08/2014 and radiograph dated 05/05/2014  FINDINGS: The edema in the right triceps muscle is even more extensive than on the prior study. The muscle is larger than on the prior  study. There is only slight sparing of the proximal aspect of the muscle. Subcutaneous edema is more prominent. The underlying humerus and the neurovascular bundle are normal. The focal area of abnormal signal at the distal mild tendinous junction is unchanged.  IMPRESSION: Progressive edema of the triceps muscle consistent with myositis. Focal area of abnormal signal at the mild tendinous junction could represent a tear or small abscess but this is not progressed. Given the patient's elevated white blood count, infectious myositis must be considered.   Electronically Signed   By: Lorriane Shire M.D.    On: 08/10/2014 08:50   Mr Humerus Right Wo Contrast  08/09/2014   CLINICAL DATA:  Pain throughout entire right humerus for 2 weeks.  EXAM: MRI OF THE RIGHT HUMERUS WITHOUT CONTRAST  TECHNIQUE: Multiplanar, multisequence MR imaging was performed. No intravenous contrast was administered.  COMPARISON:  None.  FINDINGS: No marrow signal abnormality. No fracture or dislocation. There is no bone destruction or periosteal reaction.  There is severe edema within the right triceps muscle with small areas of sparing proximally. At the distal musculotendinous junction of the medial head of the triceps, there is a complex 2 x 2 x 2.3 cm T2 signal abnormality with central T2 hyperintensity and peripheral intermediate signal. There is mild edema in the adjacent subcutaneous fat.  The biceps musculature is normal. There is no other fluid collection or soft tissue mass. The neurovascular bundles are grossly normal. There are multiple prominent right axillary lymph nodes.  IMPRESSION: 1. Severe edema within the right triceps muscle with small areas of sparing proximally. At the distal musculotendinous junction of the medial head of the triceps, there is a complex 2 x 2 x 2.3 cm T2 signal abnormality. The overall appearance is concerning for myositis which may be secondary to an infectious or inflammatory etiology. The 2 cm area of focal abnormal signal which may reflect a hematoma secondary to a partial tear given the location is at the musculotendinous junction of the medial head of the triceps, but given the extensiveness of the muscle edema, this may reflect a developing abscess versus myonecrosis.   Electronically Signed   By: Kathreen Devoid   On: 08/09/2014 08:31   US Renal  08/10/2014   CLINICAL DATA:  Acute kidney injury.  Elevated creatinine level.  EXAM: RENAL / URINARY TRACT ULTRASOUND COMPLETE  COMPARISON:  CT examination 03/09/2013 and ultrasound 10/04/2008  FINDINGS: Right Kidney:  Length: 10.6 cm. The right kidney  parenchyma is echogenic without hydronephrosis. There is a heterogeneous hypoechoic structure along the upper pole measuring up to 1.1 cm. Previously, the patient had an abscess in the right kidney and unclear if this sequela from the abscess or a new small complex lesion.  Left Kidney:  Length: 10.6 cm. Left kidney is not well visualized but appears to be echogenic. Negative for hydronephrosis.  Bladder:  Appears normal for degree of bladder distention.  IMPRESSION: Negative for hydronephrosis.  Increased echogenicity of both kidneys. Findings raise concern for chronic medical renal disease.  There is a 1.1 cm heterogeneous hypoechoic structure in the right kidney upper pole. Unclear if this is the sequelae of previous renal abscess versus new small complex cystic lesion. Evaluation of this area will be difficult due to acute kidney injury and an elevated creatinine level. However, this may be better characterized with a non contrast MRI versus close surveillance with ultrasound.   Electronically Signed   By: Markus Daft M.D.   On: 08/10/2014 11:34  Ct Femur Right Wo Contrast  08/10/2014   CLINICAL DATA:  Myositis. Sudden onset of pain while lifting a feed bag 2 weeks ago.  EXAM: CT OF THE LOWER RIGHT EXTREMITY WITHOUT CONTRAST  TECHNIQUE: Multidetector CT imaging of the right femur and right tibia fibula was performed according to the standard protocol.  COMPARISON:  None.  FINDINGS: There is no bony abnormality. There is no radiopaque foreign body. There is no soft tissue mass. There is no abscess or drainable fluid collection. There is no soft tissue gas.  There is soft tissue edema around the muscles of the right lower extremity, particularly around the adductor of the via and the quadriceps musculature. This is consistent with the clinically described suspicion of myositis. There is a small knee joint effusion.  IMPRESSION: Soft tissue edema surrounding the musculature of the right lower extremity  consistent with myositis. No mass or drainable fluid collection.   Electronically Signed   By: Andreas Newport M.D.   On: 08/10/2014 03:49   Ct Tibia Fibula Right Wo Contrast  08/10/2014   CLINICAL DATA:  Myositis. Sudden onset of pain while lifting a feed bag 2 weeks ago.  EXAM: CT OF THE LOWER RIGHT EXTREMITY WITHOUT CONTRAST  TECHNIQUE: Multidetector CT imaging of the right femur and right tibia fibula was performed according to the standard protocol.  COMPARISON:  None.  FINDINGS: There is no bony abnormality. There is no radiopaque foreign body. There is no soft tissue mass. There is no abscess or drainable fluid collection. There is no soft tissue gas.  There is soft tissue edema around the muscles of the right lower extremity, particularly around the adductor of the via and the quadriceps musculature. This is consistent with the clinically described suspicion of myositis. There is a small knee joint effusion.  IMPRESSION: Soft tissue edema surrounding the musculature of the right lower extremity consistent with myositis. No mass or drainable fluid collection.   Electronically Signed   By: Andreas Newport M.D.   On: 08/10/2014 03:49   Mr Femur Right Wo Contrast  08/12/2014   CLINICAL DATA:  Myositis of the right arm and right thigh. Status post physical strain 2 weeks ago.  EXAM: MRI OF THE RIGHT FEMUR WITHOUT CONTRAST  TECHNIQUE: Multiplanar, multisequence MR imaging of the right femur was performed. No intravenous contrast was administered.  COMPARISON:  CT right femur 08/10/2014  FINDINGS: There is severe muscle edema within the vastus medialis and intermedius muscle. There is a 14 mm more focal area of fluid signal within the vastus medialis which may reflect a more focal edema versus a small fluid collection. There is mild muscle edema within the vastus lateralis and rectus femoris. There is severe muscle edema within the sartorius muscle. There is a small amount of fluid along the fascia of the  flexor compartment. There is small amount of fluid along the fascia of the flexor compartment and around the adductor compartment. There is no drainable focal fluid collection. There is no hematoma.  There is no focal marrow signal abnormality. There is no fracture or subluxation. No periosteal reaction.  IMPRESSION: 1. Severe muscle edema within the vastus medialis, vastus intermedius and sartorius muscles most consistent with myositis secondary to an infectious or inflammatory etiology. 14 mm more focal area of fluid signal within the vastus medialis which may reflect a more focal edema versus a small fluid collection. Low level edema within the vastus lateralis and rectus femoris likely reflecting myositis to a much lesser  degree.   Electronically Signed   By: Kathreen Devoid   On: 08/12/2014 08:57   Ir Fluoro Guide Cv Line Right  08/15/2014   CLINICAL DATA:  Diabetes, acute renal insufficiency  EXAM: EXAM RIGHT IJ CATHETER PLACEMENT UNDER ULTRASOUND AND FLUOROSCOPIC GUIDANCE  TECHNIQUE: The procedure, risks (including but not limited to bleeding, infection, organ damage, pneumothorax), benefits, and alternatives were explained to the patient. Questions regarding the procedure were encouraged and answered. The patient understands and consents to the procedure. Patency of the right IJ vein was confirmed with ultrasound with image documentation. An appropriate skin site was determined. Skin site was marked. Region was prepped using maximum barrier technique including cap and mask, sterile gown, sterile gloves, large sterile sheet, and Chlorhexidine as cutaneous antisepsis. The region was infiltrated locally with 1% lidocaine. Under real-time ultrasound guidance, the right IJ vein was accessed with a 19 gauge needle; the needle tip within the vein was confirmed with ultrasound image documentation. The needle exchanged over a guidewire for vascular dilator which allowed advancement of a 20 cm Trialysis catheter. This  was positioned with the tip at the cavoatrial junction. Spot chest radiograph shows good positioning and no pneumothorax. Catheter was flushed and sutured externally with 0-Prolene sutures. Patient tolerated the procedure well.  FLUOROSCOPY TIME:  1.1 mGy, 6 seconds  COMPLICATIONS: COMPLICATIONS none  IMPRESSION: 1. Technically successful right IJ Trialysis catheter placement.   Electronically Signed   By: Lucrezia Europe M.D.   On: 08/15/2014 11:00   Ir US Guide Vasc Access Right  08/15/2014   CLINICAL DATA:  Diabetes, acute renal insufficiency  EXAM: EXAM RIGHT IJ CATHETER PLACEMENT UNDER ULTRASOUND AND FLUOROSCOPIC GUIDANCE  TECHNIQUE: The procedure, risks (including but not limited to bleeding, infection, organ damage, pneumothorax), benefits, and alternatives were explained to the patient. Questions regarding the procedure were encouraged and answered. The patient understands and consents to the procedure. Patency of the right IJ vein was confirmed with ultrasound with image documentation. An appropriate skin site was determined. Skin site was marked. Region was prepped using maximum barrier technique including cap and mask, sterile gown, sterile gloves, large sterile sheet, and Chlorhexidine as cutaneous antisepsis. The region was infiltrated locally with 1% lidocaine. Under real-time ultrasound guidance, the right IJ vein was accessed with a 19 gauge needle; the needle tip within the vein was confirmed with ultrasound image documentation. The needle exchanged over a guidewire for vascular dilator which allowed advancement of a 20 cm Trialysis catheter. This was positioned with the tip at the cavoatrial junction. Spot chest radiograph shows good positioning and no pneumothorax. Catheter was flushed and sutured externally with 0-Prolene sutures. Patient tolerated the procedure well.  FLUOROSCOPY TIME:  1.1 mGy, 6 seconds  COMPLICATIONS: COMPLICATIONS none  IMPRESSION: 1. Technically successful right IJ Trialysis  catheter placement.   Electronically Signed   By: Lucrezia Europe M.D.   On: 08/15/2014 11:00   Dg Femur, Min 2 Views Right  08/10/2014   CLINICAL DATA:  Right thigh pain. Concern for myositis. Rule out soft tissue air.  EXAM: RIGHT FEMUR 2 VIEWS  COMPARISON:  None.  FINDINGS: Cortical margins of the right femur are intact. No fracture, focal osseous lesion, erosion or periosteal reaction. No soft tissue air or radiopaque foreign body. Questionable soft tissue edema proximally.  IMPRESSION: Suspect mild soft tissue edema proximally. No soft tissue air, radiopaque foreign body, or acute osseous abnormality.   Electronically Signed   By: Jeb Levering M.D.   On: 08/10/2014  01:48     CBC  Recent Labs Lab 08/09/14 1819 08/10/14 0128 08/11/14 0507 08/12/14 0538 08/13/14 0540 08/14/14 0846  WBC 22.6* 18.7* 16.9* 15.0* 15.0* 18.4*  HGB 9.5* 7.8* 7.4* 7.3* 7.3* 8.2*  HCT 28.5* 23.4* 23.0* 22.8* 22.3* 25.1*  PLT 308 242 250 276 291 204  MCV 82.6 83.0 84.2 83.8 82.9 83.9  MCH 27.5 27.7 27.1 26.8 27.1 27.4  MCHC 33.3 33.3 32.2 32.0 32.7 32.7  RDW 13.6 13.6 14.0 13.9 14.0 14.5  LYMPHSABS 1.5  --  1.0  --  0.9  --   MONOABS 1.5*  --  1.5*  --  1.4*  --   EOSABS 0.0  --  0.1  --  0.1  --   BASOSABS 0.0  --  0.1  --  0.1  --     Chemistries   Recent Labs Lab 08/09/14 1819 08/09/14 2350 08/10/14 0128 08/11/14 0507 08/12/14 0538 08/13/14 0342 08/14/14 0846  NA 136  --  135 132* 131* 130* 132*  K 4.5  --  4.0 4.7 4.6 4.8 4.3  CL 105  --  103 105 102 102 104  CO2 22  --  21* 17* 15* 12* 17*  GLUCOSE 68  --  101* 176* 215* 223* 103*  BUN 41*  --  39* 42* 47* 54* 58*  CREATININE 5.46*  --  5.33* 5.39* 5.91* 6.37* 7.33*  CALCIUM 9.0  --  8.2* 8.5* 8.5* 8.4* 8.7*  MG  --  2.0  --   --   --   --   --   AST 28  --  38  --   --   --   --   ALT 15  --  18  --   --   --   --   ALKPHOS 127*  --  166*  --   --   --   --   BILITOT 0.5  --  0.6  --   --   --   --     ------------------------------------------------------------------------------------------------------------------ estimated creatinine clearance is 10.4 mL/min (by C-G formula based on Cr of 7.33). ------------------------------------------------------------------------------------------------------------------ No results for input(s): HGBA1C in the last 72 hours. ------------------------------------------------------------------------------------------------------------------ No results for input(s): CHOL, HDL, LDLCALC, TRIG, CHOLHDL, LDLDIRECT in the last 72 hours. ------------------------------------------------------------------------------------------------------------------  Recent Labs  08/13/14 0342  TSH 17.840*   ------------------------------------------------------------------------------------------------------------------ No results for input(s): VITAMINB12, FOLATE, FERRITIN, TIBC, IRON, RETICCTPCT in the last 72 hours.  Coagulation profile  Recent Labs Lab 08/09/14 2350  INR 1.31    No results for input(s): DDIMER in the last 72 hours.  Cardiac Enzymes No results for input(s): CKMB, TROPONINI, MYOGLOBIN in the last 168 hours.  Invalid input(s): CK ------------------------------------------------------------------------------------------------------------------ Invalid input(s): POCBNP   Time Spent in minutes  35   SINGH,PRASHANT K M.D on 08/15/2014 at 12:05 PM  Between 7am to 7pm - Pager - [phone]  After 7pm go to [email or web address] - password [facility]  [name]   Office  [phone]

## 2014-08-15 NOTE — Progress Notes (Signed)
Patient ID: Tracy [name], female   DOB: 1985-03-26, 29 y.o.   MRN: [id number] Patient seen and examined at bedside.  Patient is alert and oriented. Family is with her.  Right upper extremity has slight drainage from her JP drain. This is serosanguineous in nature as expected. There is no infectious type discharge from the drain at this juncture.  She remains neurovascularly intact in the right upper extremity without comp cutting features. Her elbow feels tight which is expected given the contracted position of the triceps mechanism.  We will continue splint and drain today.  Overall she looks fairly stable at this juncture in regards to the right upper extremity. I recommended elevation continued antibiotics until the culture is back/final. Cultures and Gram stain pend at present time  She states her right thigh is still painful but to exam feels better. It is less tender about the abductor and less warmth is present. There is no erythema. I should also note her proximal shoulder is stable about the right upper extremity  Dialysis's plan for today  All questions have encouraged and answered   Alyn Riedinger M.D.

## 2014-08-16 ENCOUNTER — Inpatient Hospital Stay (HOSPITAL_COMMUNITY): Payer: BLUE CROSS/BLUE SHIELD

## 2014-08-16 ENCOUNTER — Encounter (HOSPITAL_COMMUNITY): Payer: Self-pay | Admitting: Orthopedic Surgery

## 2014-08-16 DIAGNOSIS — I509 Heart failure, unspecified: Secondary | ICD-10-CM

## 2014-08-16 LAB — VANCOMYCIN, TROUGH: Vancomycin Tr: 29 ug/mL (ref 10.0–20.0)

## 2014-08-16 LAB — CBC
HCT: 20.9 % — ABNORMAL LOW (ref 36.0–46.0)
HEMOGLOBIN: 7 g/dL — AB (ref 12.0–15.0)
MCH: 27.9 pg (ref 26.0–34.0)
MCHC: 33.5 g/dL (ref 30.0–36.0)
MCV: 83.3 fL (ref 78.0–100.0)
Platelets: 361 10*3/uL (ref 150–400)
RBC: 2.51 MIL/uL — ABNORMAL LOW (ref 3.87–5.11)
RDW: 14.4 % (ref 11.5–15.5)
WBC: 25.5 10*3/uL — ABNORMAL HIGH (ref 4.0–10.5)

## 2014-08-16 LAB — BASIC METABOLIC PANEL
Anion gap: 13 (ref 5–15)
BUN: 69 mg/dL — AB (ref 6–20)
CHLORIDE: 104 mmol/L (ref 101–111)
CO2: 15 mmol/L — ABNORMAL LOW (ref 22–32)
Calcium: 7.6 mg/dL — ABNORMAL LOW (ref 8.9–10.3)
Creatinine, Ser: 8.65 mg/dL — ABNORMAL HIGH (ref 0.44–1.00)
GFR calc Af Amer: 6 mL/min — ABNORMAL LOW (ref >60)
GFR calc non Af Amer: 6 mL/min — ABNORMAL LOW (ref >60)
GLUCOSE: 89 mg/dL (ref 65–99)
POTASSIUM: 4.6 mmol/L (ref 3.5–5.1)
Sodium: 132 mmol/L — ABNORMAL LOW (ref 135–145)

## 2014-08-16 LAB — HIV ANTIBODY (ROUTINE TESTING W REFLEX): HIV Screen 4th Generation wRfx: NONREACTIVE

## 2014-08-16 LAB — GLUCOSE, CAPILLARY
GLUCOSE-CAPILLARY: 61 mg/dL — AB (ref 65–99)
GLUCOSE-CAPILLARY: 49 mg/dL — AB (ref 65–99)
GLUCOSE-CAPILLARY: 57 mg/dL — AB (ref 65–99)
GLUCOSE-CAPILLARY: 65 mg/dL (ref 65–99)
Glucose-Capillary: 108 mg/dL — ABNORMAL HIGH (ref 65–99)
Glucose-Capillary: 94 mg/dL (ref 65–99)
Glucose-Capillary: 72 mg/dL (ref 65–99)
Glucose-Capillary: 78 mg/dL (ref 65–99)

## 2014-08-16 LAB — PREPARE RBC (CROSSMATCH)

## 2014-08-16 LAB — APTT: aPTT: 39 seconds — ABNORMAL HIGH (ref 24–37)

## 2014-08-16 LAB — PROTIME-INR
INR: 1.4 (ref 0.00–1.49)
Prothrombin Time: 17.3 seconds — ABNORMAL HIGH (ref 11.6–15.2)

## 2014-08-16 MED ORDER — FUROSEMIDE 10 MG/ML IJ SOLN
20.0000 mg | Freq: Once | INTRAMUSCULAR | Status: DC
  Filled 2014-08-16: qty 2

## 2014-08-16 MED ORDER — INSULIN GLARGINE 100 UNIT/ML ~~LOC~~ SOLN
15.0000 [IU] | Freq: Every day | SUBCUTANEOUS | Status: DC
  Administered 2014-08-18 – 2014-08-23 (×5): 15 [IU] via SUBCUTANEOUS
  Filled 2014-08-16 (×7): qty 0.15

## 2014-08-16 MED ORDER — POLYETHYLENE GLYCOL 3350 17 G PO PACK
17.0000 g | PACK | Freq: Every day | ORAL | Status: DC | PRN
  Administered 2014-08-16: 17 g via ORAL
  Filled 2014-08-16: qty 1

## 2014-08-16 MED ORDER — LIDOCAINE HCL (PF) 1 % IJ SOLN: 5.0000 mL | INTRAMUSCULAR | Status: DC | PRN

## 2014-08-16 MED ORDER — HEPARIN SODIUM (PORCINE) 1000 UNIT/ML DIALYSIS: 20.0000 [IU]/kg | INTRAMUSCULAR | Status: DC | PRN

## 2014-08-16 MED ORDER — HEPARIN SODIUM (PORCINE) 1000 UNIT/ML DIALYSIS: 1000.0000 [IU] | INTRAMUSCULAR | Status: DC | PRN

## 2014-08-16 MED ORDER — SODIUM CHLORIDE 0.9 % IV SOLN: 100.0000 mL | INTRAVENOUS | Status: DC | PRN

## 2014-08-16 MED ORDER — LIDOCAINE-PRILOCAINE 2.5-2.5 % EX CREA: 1.0000 "application " | TOPICAL_CREAM | CUTANEOUS | Status: DC | PRN

## 2014-08-16 MED ORDER — GLUCOSE 40 % PO GEL
ORAL | Status: AC
  Filled 2014-08-16: qty 1

## 2014-08-16 MED ORDER — PENTAFLUOROPROP-TETRAFLUOROETH EX AERO: 1.0000 "application " | INHALATION_SPRAY | CUTANEOUS | Status: DC | PRN

## 2014-08-16 MED ORDER — SODIUM CHLORIDE 0.9 % IV SOLN: Freq: Once | INTRAVENOUS | Status: DC

## 2014-08-16 MED ORDER — HEPARIN SODIUM (PORCINE) 5000 UNIT/ML IJ SOLN
5000.0000 [IU] | Freq: Three times a day (TID) | INTRAMUSCULAR | Status: DC
  Administered 2014-08-16 – 2014-08-31 (×40): 5000 [IU] via SUBCUTANEOUS
  Filled 2014-08-16 (×54): qty 1

## 2014-08-16 MED ORDER — SENNOSIDES-DOCUSATE SODIUM 8.6-50 MG PO TABS
2.0000 | ORAL_TABLET | Freq: Every day | ORAL | Status: DC
Start: 2014-08-16 — End: 2014-08-29
  Administered 2014-08-16 – 2014-08-28 (×9): 2 via ORAL
  Filled 2014-08-16 (×14): qty 2

## 2014-08-16 MED ORDER — DIPHENHYDRAMINE HCL 50 MG/ML IJ SOLN: 25.0000 mg | Freq: Four times a day (QID) | INTRAMUSCULAR | Status: DC | PRN

## 2014-08-16 MED ORDER — ALTEPLASE 2 MG IJ SOLR
2.0000 mg | Freq: Once | INTRAMUSCULAR | Status: DC | PRN
  Filled 2014-08-16: qty 2

## 2014-08-16 MED ORDER — DEXTROSE 50 % IV SOLN
INTRAVENOUS | Status: AC
  Administered 2014-08-16: 50 mL
  Filled 2014-08-16: qty 50

## 2014-08-16 MED ORDER — NEPRO/CARBSTEADY PO LIQD
237.0000 mL | ORAL | Status: DC | PRN
  Filled 2014-08-16: qty 237

## 2014-08-16 NOTE — Progress Notes (Addendum)
ANTIBIOTIC CONSULT NOTE - FOLLOW UP  Pharmacy Consult for Vancomycin/Clindamycin Indication: r/o infection in R arm/thigh  Allergies  Allergen Reactions  . Penicillins Hives and Swelling  . Sulfa Antibiotics Hives    Patient Measurements: Height: 5\' 3"  (160 cm) Weight: 182 lb 15.7 oz (83 kg) IBW/kg (Calculated) : 52.4 Adjusted Body Weight:    Vital Signs: Temp: 98.9 F (37.2 C) (07/05 0545) Temp Source: Oral (07/05 0545) BP: 168/89 mmHg (07/05 0630) Pulse Rate: 94 (07/05 0630) Intake/Output from previous day: 07/04 0701 - 07/05 0700 In: 0  Out: 100 [Urine:100] Intake/Output from this shift:    Labs:  Recent Labs  08/14/14 0846 08/16/14 0634  WBC 18.4* 25.5*  HGB 8.2* 7.0*  PLT 204 361  CREATININE 7.33*  --    Estimated Creatinine Clearance: 11.4 mL/min (by C-G formula based on Cr of 7.33).  Recent Labs  08/16/14 0635  VANCOTROUGH 29*   Assessment: 29yo female with history of hypothyroidism, DM, anemia, and HTN presents from [facility] with myositis of R arm and R thigh. Pharmacy is consulted to dose vancomycin and aztreonam for myonecrosis.  - Poor DM and Synthroid compliance PTA  ID: Abx for myositis/myonecrosis on RUE and RLE, WBC up to 25.5, AFeb. Day #7 abx. ID MD following.  Vancomycin random level prior to pt's first HD = 29 mcg/ml (goal typically 15-25 mcg/ml prior to HD)  6/28 vanc>> 6/28 aztreonam >>  6/28 urine cx: >100,000 lactobacillus 6/28 blood cx: negative 7/3: Tissue/Abscess x 2: GPC on gram stain - ngtd on culture  Patient's renal function is poor with the possible need for HD at some point this admission.  Goal of Therapy:  Pre-HD vanc level 15-25 mcg/ml  Plan:  -Discontinue scheduled vancomycin for now -Will f/u HD tolerance (new HD initiation - first 7/5) to see when next dose indicated - likely will not need Vanc until after 2nd HD session -Continue clindamycin 900 mg IV q8h -F/U culture results, clinical status, HD  tolerance  Sherlon Handing, PharmD, BCPS Clinical pharmacist, pager [phone] 08/16/2014 7:16 AM  Addum:  HD session today RBF 150 X 1 hr then 200 for 3 hours.  Will redose vanc with next HD Excell Seltzer, PharmD

## 2014-08-16 NOTE — Progress Notes (Signed)
Patient Demographics:    Tracy [name], is a 29 y.o. female, DOB - 1985/09/16, [id number]  Admit date - 08/09/2014   Admitting Physician Ivor Costa, MD  Outpatient Primary MD for the patient is [name]  LOS - 7   Chief Complaint  Patient presents with  . Arm Pain        Subjective:    Tracy [name] today has, No headache, No chest pain, No abdominal pain - No Nausea, No new weakness tingling or numbness, No Cough - SOB. Continues to have right arm and right thigh pain.   Assessment  & Plan :     1. Myositis of the right arm and right thigh. Most likely post physical strain 2 weeks ago while throwing horse feed. Case discussed with rheumatologist Dr. Amil Amen over the phone on the day of admission and then on 08/14/2014, ANA is negative, aldolase unremarkable 2, CK is mildly elevated but has normalized on follow-up. Also being seen and followed by general surgery, hand surgery and orthopedics both on board.   Ultrasound of the right upper arm does not show a clot. Has received IV anti-biotics adequately since admission as infection was not completely ruled out, on 08/14/2014 she continues to up trend her leukocytosis, case was discussed in detail with Dr. Amil Amen rheumatologist,   She is post right arm muscle biopsy, fluid drainage and JP drain placement on 08/14/2014 by hand surgery, per hand surgery and continue anti-biotics, so far cultures growing gram-positive cocci in clusters. On vancomycin since admission will be continued added Clinda stop Aztreonam, ID consulted. Source for her seeding is unclear, will check a transthoracic echocardiogram and monitor culture sensitivity. Discussed with Dr. Edmonia Lynch orthopedics as well he request IR to do a CT-guided drainage of the right  thigh fluid collection. He will be following closely as well.   Per Dr. Amil Amen hold steroids still biopsy shows inflammation or a need for steroids use.     2. Hypothyroidism. Was not taking Synthroid for the last couple of months, counseled on compliance, TSH was 25, Synthroid resumed monitorinf TSH is gradually improving.    3. Anemia - iron deficiency on into anemia panel, IV and replaced. We'll transfuse 2 units of packed RBC on 08/16/2014 with dialysis. Outpatient workup for iron deficiency anemia in a menstruating female as appropriate.    4.ARF of CKD 4 - baseline creatinine of 3, had some nausea vomiting and NSAID use prior to admission, likely ATN. Avoid nephrotoxins, continue hydration, renal ultrasound shows cyst but no obstruction, urine output stable, Renal following. Started on dialysis on 08/16/2014 via right IJ dialysis catheter placed by IR on 06/01/2014.    5. DM1 - poor outpatient control with A1c of 8.9 , reduced Lantus and stopped QHS ISS as am CBG low,  monitor CBGs.  CBG (last 3)   Recent Labs  08/16/14 0226 08/16/14 0943 08/16/14 1028  GLUCAP 108* 57* 49*    Lab Results  Component Value Date   HGBA1C 8.9* 08/10/2014     6. UA appears clean with 0-2 WBCs ruling out UTI, cultures growing lactobacilli which likely is contamination from vaginal flora. Monitor clinically.     Code Status : Full  Family Communication  : Mother bedside,  boyfriend bedside  Disposition Plan  : Home   Consults  :  Orthopedics, hand surgery, general surgery, Renal, rheumatologist Dr. Amil Amen over the phone 2, ID  Procedures  :   R Arm - Muscle biopsy, fluid drainage and JP drain placement 08-14-14 by Dr Amedeo Plenty.  Right upper extremity venous duplex. No DVT, MRI right arm and right leg x 2, CT right thigh  Right IJ dialysis catheter placed by IR on 06/01/2014  Started on dialysis 08/16/2014   DVT Prophylaxis  :   Heparin    Lab Results  Component Value Date     PLT 361 08/16/2014    Inpatient Medications  Scheduled Meds: . sodium chloride   Intravenous Once  . amLODipine  10 mg Oral Daily  . clindamycin (CLEOCIN) IV  900 mg Intravenous Q8H  . dextrose      . famotidine  40 mg Oral BID  . heparin  5,000 Units Subcutaneous 3 times per day  . insulin aspart  0-9 Units Subcutaneous TID WC  . [START ON 08/17/2014] insulin glargine  15 Units Subcutaneous Daily  . levothyroxine  125 mcg Oral QAC breakfast  . metoprolol tartrate  50 mg Oral BID  . multivitamin with minerals  1 tablet Oral Daily  . sodium bicarbonate  1,300 mg Oral BID  . sodium chloride  1,000 mL Intravenous Once  . sodium chloride  3 mL Intravenous Q12H   Continuous Infusions: . sodium chloride 100 mL/hr at 08/16/14 1003   PRN Meds:.acetaminophen **OR** acetaminophen, alum & mag hydroxide-simeth, diphenhydrAMINE, hydrALAZINE, HYDROcodone-acetaminophen, HYDROmorphone (DILAUDID) injection, ondansetron, zolpidem  Antibiotics  :     Anti-infectives    Start     Dose/Rate Route Frequency Ordered Stop   08/15/14 1600  clindamycin (CLEOCIN) IVPB 900 mg     900 mg 100 mL/hr over 30 Minutes Intravenous Every 8 hours 08/15/14 1529     08/15/14 1530  clindamycin (CLEOCIN) IVPB 300 mg  Status:  Discontinued     300 mg 100 mL/hr over 30 Minutes Intravenous 3 times per day 08/15/14 1522 08/15/14 1531   08/15/14 1430  aztreonam (AZACTAM) 500 mg in dextrose 5 % 50 mL IVPB  Status:  Discontinued     500 mg 100 mL/hr over 30 Minutes Intravenous Every 8 hours 08/15/14 1118 08/15/14 1522   08/11/14 2200  vancomycin (VANCOCIN) IVPB 1000 mg/200 mL premix  Status:  Discontinued     1,000 mg 200 mL/hr over 60 Minutes Intravenous Every 48 hours 08/09/14 2256 08/16/14 0716   08/10/14 1045  vancomycin (VANCOCIN) IVPB 750 mg/150 ml premix  Status:  Discontinued     750 mg 150 mL/hr over 60 Minutes Intravenous Every 12 hours 08/09/14 2252 08/09/14 2256   08/09/14 2230  aztreonam (AZACTAM) 1 g in  dextrose 5 % 50 mL IVPB  Status:  Discontinued     1 g 100 mL/hr over 30 Minutes Intravenous Every 8 hours 08/09/14 2215 08/15/14 1118   08/09/14 2200  vancomycin (VANCOCIN) IVPB 1000 mg/200 mL premix  Status:  Discontinued     1,000 mg 200 mL/hr over 60 Minutes Intravenous Every 48 hours 08/09/14 2159 08/09/14 2252   08/09/14 2145  vancomycin (VANCOCIN) IVPB 1000 mg/200 mL premix  Status:  Discontinued     1,000 mg 200 mL/hr over 60 Minutes Intravenous  Once 08/09/14 2137 08/09/14 2147        Objective:   Filed Vitals:   08/16/14 0800 08/16/14 0830 08/16/14 0857 08/16/14 [id number]  BP: 177/93 179/94 169/89 158/75  Pulse: 96 96 95 99  Temp:   98 F (36.7 C) 98.9 F (37.2 C)  TempSrc:   Oral Oral  Resp: 18 17 16 17   Height:      Weight:   82 kg (180 lb 12.4 oz)   SpO2: 96% 96% 95% 94%    Wt Readings from Last 3 Encounters:  08/16/14 82 kg (180 lb 12.4 oz)  08/08/14 68.493 kg (151 lb)  08/05/14 68.493 kg (151 lb)     Intake/Output Summary (Last 24 hours) at 08/16/14 1112 Last data filed at 08/16/14 0857  Gross per 24 hour  Intake      0 ml  Output   1100 ml  Net  -1100 ml     Physical Exam  Awake Alert, Oriented X 3, No new F.N deficits, Normal affect Portal.AT,PERRAL Supple Neck,No JVD, No cervical lymphadenopathy appriciated.  Symmetrical Chest wall movement, Good air movement bilaterally, CTAB RRR,No Gallops,Rubs or new Murmurs, No Parasternal Heave +ve B.Sounds, Abd Soft, No tenderness, No organomegaly appriciated, No rebound - guarding or rigidity. No Cyanosis, Clubbing or edema, No new Rash or bruise  Swelling in the right arm and right thigh, some tenderness, no warmth, no signs of compartment syndrome    Data Review:   Micro Results Recent Results (from the past 240 hour(s))  Urine culture     Status: None   Collection Time: 08/09/14  1:57 PM  Result Value Ref Range Status   Specimen Description URINE, CLEAN CATCH  Final   Special Requests NONE  Final    Culture >=100,000 COLONIES/mL LACTOBACILLUS SPECIES  Final   Report Status 08/11/2014 FINAL  Final  Culture, blood (x 2)     Status: None   Collection Time: 08/09/14 11:42 PM  Result Value Ref Range Status   Specimen Description BLOOD LEFT HAND  Final   Special Requests BOTTLES DRAWN AEROBIC ONLY Branch  Final   Culture NO GROWTH 5 DAYS  Final   Report Status 08/15/2014 FINAL  Final  Culture, blood (x 2)     Status: None   Collection Time: 08/09/14 11:50 PM  Result Value Ref Range Status   Specimen Description BLOOD RIGHT HAND  Final   Special Requests   Final    BOTTLES DRAWN AEROBIC AND ANAEROBIC [place] BLUE 5CC PURPLE   Culture NO GROWTH 5 DAYS  Final   Report Status 08/15/2014 FINAL  Final  Anaerobic culture     Status: None (Preliminary result)   Collection Time: 08/14/14  2:32 PM  Result Value Ref Range Status   Specimen Description FLUID  Final   Special Requests RIGHT ARM FLUID A  Final   Gram Stain   Final    MODERATE WBC PRESENT,BOTH PMN AND MONONUCLEAR NO SQUAMOUS EPITHELIAL CELLS SEEN RARE GRAM POSITIVE COCCI IN PAIRS IN CLUSTERS Performed at [facility]    Culture   Final    NO ANAEROBES ISOLATED; CULTURE IN PROGRESS FOR 5 DAYS Performed at [facility]    Report Status PENDING  Incomplete  Culture, routine-abscess     Status: None (Preliminary result)   Collection Time: 08/14/14  2:32 PM  Result Value Ref Range Status   Specimen Description ARM RIGHT  Final   Special Requests NONE  Final   Gram Stain   Final    FEW WBC PRESENT, PREDOMINANTLY PMN NO SQUAMOUS EPITHELIAL CELLS SEEN RARE GRAM POSITIVE COCCI IN CLUSTERS Performed at [facility]  Culture   Final    FEW STAPHYLOCOCCUS AUREUS Note: RIFAMPIN AND GENTAMICIN SHOULD NOT BE USED AS SINGLE DRUGS FOR TREATMENT OF STAPH INFECTIONS. Performed at [facility]    Report Status PENDING  Incomplete  Anaerobic culture     Status: None (Preliminary result)   Collection Time:  08/14/14  2:36 PM  Result Value Ref Range Status   Specimen Description TISSUE  Final   Special Requests RIGHT ARM B  Final   Gram Stain   Final    NO WBC SEEN NO SQUAMOUS EPITHELIAL CELLS SEEN RARE GRAM POSITIVE COCCI IN PAIRS IN CLUSTERS Performed at [facility]    Culture   Final    NO ANAEROBES ISOLATED; CULTURE IN PROGRESS FOR 5 DAYS Performed at [facility]    Report Status PENDING  Incomplete  Tissue culture     Status: None (Preliminary result)   Collection Time: 08/14/14  2:36 PM  Result Value Ref Range Status   Specimen Description TISSUE  Final   Special Requests RIGHT ARM B  Final   Gram Stain   Final    RARE WBC PRESENT, PREDOMINANTLY PMN NO SQUAMOUS EPITHELIAL CELLS SEEN RARE GRAM POSITIVE COCCI IN PAIRS Performed at [facility]    Culture   Final    MODERATE STAPHYLOCOCCUS AUREUS Note: RIFAMPIN AND GENTAMICIN SHOULD NOT BE USED AS SINGLE DRUGS FOR TREATMENT OF STAPH INFECTIONS. Performed at [facility]    Report Status PENDING  Incomplete    Radiology Reports Dg Elbow Complete Right  08/05/2014   CLINICAL DATA:  Right elbow pain that radiates to the shoulder. Pain started after picking up horse feed.  EXAM: RIGHT ELBOW - COMPLETE 3+ VIEW  COMPARISON:  None.  FINDINGS: There is no evidence of fracture, dislocation, or joint effusion. There is no evidence of arthropathy or other focal bone abnormality. Soft tissues are unremarkable.  IMPRESSION: Negative.   Electronically Signed   By: Markus Daft M.D.   On: 08/05/2014 08:05   Mr Humerus Right Wo Contrast  08/12/2014   CLINICAL DATA:  Myositis in the right arm.  Injury 2 weeks ago.  EXAM: MRI OF THE RIGHT HUMERUS WITHOUT CONTRAST  TECHNIQUE: Multiplanar, multisequence MR imaging was performed. No intravenous contrast was administered.  COMPARISON:  08/10/2014  FINDINGS: Dependent atelectasis in the right lower lobe.  There is considerable abnormal edema in the lateral, long, and  medial head of the triceps diffusely. The internal tendon of the medial head seems torn on images 20 through 23 of series 7, with an associated 2.2 by 0.6 by 1.4 cm fluid collection along the proximal margin of the tendon.  There is edema in the coracobrachialis muscle tendon a small medial portion of the brachialis muscle.  Left axillary lymph nodes measure up to 1.2 cm in short axis. Edema tracks along the axillary neurovascular structures.  Fluid signal tracks superficial to the posterior deltoid muscle and superficial to the latissimus dorsi and infraspinatus and teres minor muscles.  Subcutaneous edema tracks in the upper arm circumferentially, somewhat more confluent anteriorly.  Small elbow joint effusion.  No significant abnormal osseous edema.  IMPRESSION: 1. Again observed is extensive myositis primarily involving the triceps and coracobrachialis muscles, with a small intrasubstance fluid collection along the proximal tendinous margin of the medial head of the triceps which may represent musculotendinous tear or less likely a very small abscess. This collection is similar to prior. Possibilities include posttraumatic myositis, infection, or a  vascular etiology. Mild reactive axillary adenopathy on the right may suggest an infectious component. 2. Subcutaneous edema in the upper arm, mildly increased. Cellulitis not excluded. 3. Edema tracks along the superficial margin of the deltoid muscle, teres minor, and infraspinatus as well as the latissimus dorsi. This may be incidental but shear injury cannot be excluded.   Electronically Signed   By: Van Clines M.D.   On: 08/12/2014 08:46   Mr Humerus Right Wo Contrast  08/10/2014   CLINICAL DATA:  Right arm pain after an injury throwing a bag of horse feed 2 weeks ago.  EXAM: MRI OF THE RIGHT HUMERUS WITHOUT CONTRAST  TECHNIQUE: Multiplanar, multisequence MR imaging was performed. No intravenous contrast was administered.  COMPARISON:  MRI dated  08/08/2014 and radiograph dated 05/05/2014  FINDINGS: The edema in the right triceps muscle is even more extensive than on the prior study. The muscle is larger than on the prior study. There is only slight sparing of the proximal aspect of the muscle. Subcutaneous edema is more prominent. The underlying humerus and the neurovascular bundle are normal. The focal area of abnormal signal at the distal mild tendinous junction is unchanged.  IMPRESSION: Progressive edema of the triceps muscle consistent with myositis. Focal area of abnormal signal at the mild tendinous junction could represent a tear or small abscess but this is not progressed. Given the patient's elevated white blood count, infectious myositis must be considered.   Electronically Signed   By: Lorriane Shire M.D.   On: 08/10/2014 08:50   Mr Humerus Right Wo Contrast  08/09/2014   CLINICAL DATA:  Pain throughout entire right humerus for 2 weeks.  EXAM: MRI OF THE RIGHT HUMERUS WITHOUT CONTRAST  TECHNIQUE: Multiplanar, multisequence MR imaging was performed. No intravenous contrast was administered.  COMPARISON:  None.  FINDINGS: No marrow signal abnormality. No fracture or dislocation. There is no bone destruction or periosteal reaction.  There is severe edema within the right triceps muscle with small areas of sparing proximally. At the distal musculotendinous junction of the medial head of the triceps, there is a complex 2 x 2 x 2.3 cm T2 signal abnormality with central T2 hyperintensity and peripheral intermediate signal. There is mild edema in the adjacent subcutaneous fat.  The biceps musculature is normal. There is no other fluid collection or soft tissue mass. The neurovascular bundles are grossly normal. There are multiple prominent right axillary lymph nodes.  IMPRESSION: 1. Severe edema within the right triceps muscle with small areas of sparing proximally. At the distal musculotendinous junction of the medial head of the triceps, there is a  complex 2 x 2 x 2.3 cm T2 signal abnormality. The overall appearance is concerning for myositis which may be secondary to an infectious or inflammatory etiology. The 2 cm area of focal abnormal signal which may reflect a hematoma secondary to a partial tear given the location is at the musculotendinous junction of the medial head of the triceps, but given the extensiveness of the muscle edema, this may reflect a developing abscess versus myonecrosis.   Electronically Signed   By: Kathreen Devoid   On: 08/09/2014 08:31   US Renal  08/10/2014   CLINICAL DATA:  Acute kidney injury.  Elevated creatinine level.  EXAM: RENAL / URINARY TRACT ULTRASOUND COMPLETE  COMPARISON:  CT examination 03/09/2013 and ultrasound 10/04/2008  FINDINGS: Right Kidney:  Length: 10.6 cm. The right kidney parenchyma is echogenic without hydronephrosis. There is a heterogeneous hypoechoic structure along the upper  pole measuring up to 1.1 cm. Previously, the patient had an abscess in the right kidney and unclear if this sequela from the abscess or a new small complex lesion.  Left Kidney:  Length: 10.6 cm. Left kidney is not well visualized but appears to be echogenic. Negative for hydronephrosis.  Bladder:  Appears normal for degree of bladder distention.  IMPRESSION: Negative for hydronephrosis.  Increased echogenicity of both kidneys. Findings raise concern for chronic medical renal disease.  There is a 1.1 cm heterogeneous hypoechoic structure in the right kidney upper pole. Unclear if this is the sequelae of previous renal abscess versus new small complex cystic lesion. Evaluation of this area will be difficult due to acute kidney injury and an elevated creatinine level. However, this may be better characterized with a non contrast MRI versus close surveillance with ultrasound.   Electronically Signed   By: Markus Daft M.D.   On: 08/10/2014 11:34   Ct Femur Right Wo Contrast  08/10/2014   CLINICAL DATA:  Myositis. Sudden onset of pain  while lifting a feed bag 2 weeks ago.  EXAM: CT OF THE LOWER RIGHT EXTREMITY WITHOUT CONTRAST  TECHNIQUE: Multidetector CT imaging of the right femur and right tibia fibula was performed according to the standard protocol.  COMPARISON:  None.  FINDINGS: There is no bony abnormality. There is no radiopaque foreign body. There is no soft tissue mass. There is no abscess or drainable fluid collection. There is no soft tissue gas.  There is soft tissue edema around the muscles of the right lower extremity, particularly around the adductor of the via and the quadriceps musculature. This is consistent with the clinically described suspicion of myositis. There is a small knee joint effusion.  IMPRESSION: Soft tissue edema surrounding the musculature of the right lower extremity consistent with myositis. No mass or drainable fluid collection.   Electronically Signed   By: Andreas Newport M.D.   On: 08/10/2014 03:49   Ct Tibia Fibula Right Wo Contrast  08/10/2014   CLINICAL DATA:  Myositis. Sudden onset of pain while lifting a feed bag 2 weeks ago.  EXAM: CT OF THE LOWER RIGHT EXTREMITY WITHOUT CONTRAST  TECHNIQUE: Multidetector CT imaging of the right femur and right tibia fibula was performed according to the standard protocol.  COMPARISON:  None.  FINDINGS: There is no bony abnormality. There is no radiopaque foreign body. There is no soft tissue mass. There is no abscess or drainable fluid collection. There is no soft tissue gas.  There is soft tissue edema around the muscles of the right lower extremity, particularly around the adductor of the via and the quadriceps musculature. This is consistent with the clinically described suspicion of myositis. There is a small knee joint effusion.  IMPRESSION: Soft tissue edema surrounding the musculature of the right lower extremity consistent with myositis. No mass or drainable fluid collection.   Electronically Signed   By: Andreas Newport M.D.   On: 08/10/2014 03:49    Mr Femur Right Wo Contrast  08/12/2014   CLINICAL DATA:  Myositis of the right arm and right thigh. Status post physical strain 2 weeks ago.  EXAM: MRI OF THE RIGHT FEMUR WITHOUT CONTRAST  TECHNIQUE: Multiplanar, multisequence MR imaging of the right femur was performed. No intravenous contrast was administered.  COMPARISON:  CT right femur 08/10/2014  FINDINGS: There is severe muscle edema within the vastus medialis and intermedius muscle. There is a 14 mm more focal area of fluid signal within the  vastus medialis which may reflect a more focal edema versus a small fluid collection. There is mild muscle edema within the vastus lateralis and rectus femoris. There is severe muscle edema within the sartorius muscle. There is a small amount of fluid along the fascia of the flexor compartment. There is small amount of fluid along the fascia of the flexor compartment and around the adductor compartment. There is no drainable focal fluid collection. There is no hematoma.  There is no focal marrow signal abnormality. There is no fracture or subluxation. No periosteal reaction.  IMPRESSION: 1. Severe muscle edema within the vastus medialis, vastus intermedius and sartorius muscles most consistent with myositis secondary to an infectious or inflammatory etiology. 14 mm more focal area of fluid signal within the vastus medialis which may reflect a more focal edema versus a small fluid collection. Low level edema within the vastus lateralis and rectus femoris likely reflecting myositis to a much lesser degree.   Electronically Signed   By: Kathreen Devoid   On: 08/12/2014 08:57   Ir Fluoro Guide Cv Line Right  08/15/2014   CLINICAL DATA:  Diabetes, acute renal insufficiency  EXAM: EXAM RIGHT IJ CATHETER PLACEMENT UNDER ULTRASOUND AND FLUOROSCOPIC GUIDANCE  TECHNIQUE: The procedure, risks (including but not limited to bleeding, infection, organ damage, pneumothorax), benefits, and alternatives were explained to the patient.  Questions regarding the procedure were encouraged and answered. The patient understands and consents to the procedure. Patency of the right IJ vein was confirmed with ultrasound with image documentation. An appropriate skin site was determined. Skin site was marked. Region was prepped using maximum barrier technique including cap and mask, sterile gown, sterile gloves, large sterile sheet, and Chlorhexidine as cutaneous antisepsis. The region was infiltrated locally with 1% lidocaine. Under real-time ultrasound guidance, the right IJ vein was accessed with a 19 gauge needle; the needle tip within the vein was confirmed with ultrasound image documentation. The needle exchanged over a guidewire for vascular dilator which allowed advancement of a 20 cm Trialysis catheter. This was positioned with the tip at the cavoatrial junction. Spot chest radiograph shows good positioning and no pneumothorax. Catheter was flushed and sutured externally with 0-Prolene sutures. Patient tolerated the procedure well.  FLUOROSCOPY TIME:  1.1 mGy, 6 seconds  COMPLICATIONS: COMPLICATIONS none  IMPRESSION: 1. Technically successful right IJ Trialysis catheter placement.   Electronically Signed   By: Lucrezia Europe M.D.   On: 08/15/2014 11:00   Ir US Guide Vasc Access Right  08/15/2014   CLINICAL DATA:  Diabetes, acute renal insufficiency  EXAM: EXAM RIGHT IJ CATHETER PLACEMENT UNDER ULTRASOUND AND FLUOROSCOPIC GUIDANCE  TECHNIQUE: The procedure, risks (including but not limited to bleeding, infection, organ damage, pneumothorax), benefits, and alternatives were explained to the patient. Questions regarding the procedure were encouraged and answered. The patient understands and consents to the procedure. Patency of the right IJ vein was confirmed with ultrasound with image documentation. An appropriate skin site was determined. Skin site was marked. Region was prepped using maximum barrier technique including cap and mask, sterile gown,  sterile gloves, large sterile sheet, and Chlorhexidine as cutaneous antisepsis. The region was infiltrated locally with 1% lidocaine. Under real-time ultrasound guidance, the right IJ vein was accessed with a 19 gauge needle; the needle tip within the vein was confirmed with ultrasound image documentation. The needle exchanged over a guidewire for vascular dilator which allowed advancement of a 20 cm Trialysis catheter. This was positioned with the tip at the cavoatrial junction. Spot  chest radiograph shows good positioning and no pneumothorax. Catheter was flushed and sutured externally with 0-Prolene sutures. Patient tolerated the procedure well.  FLUOROSCOPY TIME:  1.1 mGy, 6 seconds  COMPLICATIONS: COMPLICATIONS none  IMPRESSION: 1. Technically successful right IJ Trialysis catheter placement.   Electronically Signed   By: Lucrezia Europe M.D.   On: 08/15/2014 11:00   Dg Femur, Min 2 Views Right  08/10/2014   CLINICAL DATA:  Right thigh pain. Concern for myositis. Rule out soft tissue air.  EXAM: RIGHT FEMUR 2 VIEWS  COMPARISON:  None.  FINDINGS: Cortical margins of the right femur are intact. No fracture, focal osseous lesion, erosion or periosteal reaction. No soft tissue air or radiopaque foreign body. Questionable soft tissue edema proximally.  IMPRESSION: Suspect mild soft tissue edema proximally. No soft tissue air, radiopaque foreign body, or acute osseous abnormality.   Electronically Signed   By: Jeb Levering M.D.   On: 08/10/2014 01:48     CBC  Recent Labs Lab 08/09/14 1819  08/11/14 0507 08/12/14 0538 08/13/14 0540 08/14/14 0846 08/16/14 0634  WBC 22.6*  < > 16.9* 15.0* 15.0* 18.4* 25.5*  HGB 9.5*  < > 7.4* 7.3* 7.3* 8.2* 7.0*  HCT 28.5*  < > 23.0* 22.8* 22.3* 25.1* 20.9*  PLT 308  < > 250 276 291 204 361  MCV 82.6  < > 84.2 83.8 82.9 83.9 83.3  MCH 27.5  < > 27.1 26.8 27.1 27.4 27.9  MCHC 33.3  < > 32.2 32.0 32.7 32.7 33.5  RDW 13.6  < > 14.0 13.9 14.0 14.5 14.4  LYMPHSABS 1.5   --  1.0  --  0.9  --   --   MONOABS 1.5*  --  1.5*  --  1.4*  --   --   EOSABS 0.0  --  0.1  --  0.1  --   --   BASOSABS 0.0  --  0.1  --  0.1  --   --   < > = values in this interval not displayed.  Chemistries   Recent Labs Lab 08/09/14 1819 08/09/14 2350 08/10/14 0128 08/11/14 0507 08/12/14 0538 08/13/14 0342 08/14/14 0846 08/16/14 0634  NA 136  --  135 132* 131* 130* 132* 132*  K 4.5  --  4.0 4.7 4.6 4.8 4.3 4.6  CL 105  --  103 105 102 102 104 104  CO2 22  --  21* 17* 15* 12* 17* 15*  GLUCOSE 68  --  101* 176* 215* 223* 103* 89  BUN 41*  --  39* 42* 47* 54* 58* 69*  CREATININE 5.46*  --  5.33* 5.39* 5.91* 6.37* 7.33* 8.65*  CALCIUM 9.0  --  8.2* 8.5* 8.5* 8.4* 8.7* 7.6*  MG  --  2.0  --   --   --   --   --   --   AST 28  --  38  --   --   --   --   --   ALT 15  --  18  --   --   --   --   --   ALKPHOS 127*  --  166*  --   --   --   --   --   BILITOT 0.5  --  0.6  --   --   --   --   --    ------------------------------------------------------------------------------------------------------------------ estimated creatinine clearance is 9.6 mL/min (by C-G formula based on Cr of 8.65). ------------------------------------------------------------------------------------------------------------------  No results for input(s): HGBA1C in the last 72 hours. ------------------------------------------------------------------------------------------------------------------ No results for input(s): CHOL, HDL, LDLCALC, TRIG, CHOLHDL, LDLDIRECT in the last 72 hours. ------------------------------------------------------------------------------------------------------------------ No results for input(s): TSH, T4TOTAL, T3FREE, THYROIDAB in the last 72 hours.  Invalid input(s): FREET3 ------------------------------------------------------------------------------------------------------------------ No results for input(s): VITAMINB12, FOLATE, FERRITIN, TIBC, IRON, RETICCTPCT in the last  72 hours.  Coagulation profile  Recent Labs Lab 08/09/14 2350 08/16/14 0635  INR 1.31 1.40    No results for input(s): DDIMER in the last 72 hours.  Cardiac Enzymes No results for input(s): CKMB, TROPONINI, MYOGLOBIN in the last 168 hours.  Invalid input(s): CK ------------------------------------------------------------------------------------------------------------------ Invalid input(s): POCBNP   Time Spent in minutes  35   SINGH,PRASHANT K M.D on 08/16/2014 at 11:12 AM  Between 7am to 7pm - Pager - [phone]  After 7pm go to [email or web address] - password [facility]  [name]   Office  [phone]

## 2014-08-16 NOTE — Progress Notes (Signed)
Patient ID: Tracy [name], female   DOB: 08/09/1985, 29 y.o.   MRN: [id number] Patient is awake alert and oriented at bedside  This is the best I've seen her in terms of her spirits and pain level  Culture results are noted. I would agree  with Dr. Hale Bogus assessment regarding antibiotics.  Certainly the area of biopsy and myositis were significant as my op note reflects. The small fluid area is central in the triceps is an and was noted to be focal as the MRI suggested and not necessarily pervasive throughout the muscle.  I discussed with the patient we will continue her drain today and plan for dressing change tomorrow.  I certainly recommend continued elevation finger motion and massage.  She remains neurovascularly intact and does not have any evidence of ascending infection. However she certainly gives [place] all a lot of concerned given this unusual presentation and course of events.  All questions have been encouraged and answered at bedside.  Plans for tomorrow noted.  Lilyan Prete M.D.

## 2014-08-16 NOTE — Progress Notes (Signed)
CRITICAL VALUE ALERT  Critical value received: Vancomycin trough  Date of notification:  08/16/14  Time of notification:  0715  Critical value read back:yes  Nurse who received alert:  Ocie Cornfield  MD notified (1st page):  Dr. Posey Pronto  Time of first page:  0720  MD notified (2nd page):  Time of second page:  Responding MD:  Dr. Posey Pronto  Time MD responded:  [phone]

## 2014-08-16 NOTE — Progress Notes (Signed)
Patient ID: Tracy [name], female   DOB: 09-12-1985, 29 y.o.   MRN: [id number]         [facility]    Date of Admission:  08/09/2014   Total days of antibiotics 8        Day 8 of vancomycin        Day 2 of clindamycin  Principal Problem:   Myositis Active Problems:   Proliferative diabetic retinopathy   Hypothyroidism   Normocytic anemia   Acute renal failure superimposed on stage 4 chronic kidney disease   Seizure   Sepsis   DM I (diabetes mellitus, type I), uncontrolled   Essential hypertension   . sodium chloride   Intravenous Once  . amLODipine  10 mg Oral Daily  . clindamycin (CLEOCIN) IV  900 mg Intravenous Q8H  . dextrose      . famotidine  40 mg Oral BID  . heparin  5,000 Units Subcutaneous 3 times per day  . insulin aspart  0-9 Units Subcutaneous TID WC  . [START ON 08/17/2014] insulin glargine  15 Units Subcutaneous Daily  . levothyroxine  125 mcg Oral QAC breakfast  . metoprolol tartrate  50 mg Oral BID  . multivitamin with minerals  1 tablet Oral Daily  . sodium bicarbonate  1,300 mg Oral BID  . sodium chloride  1,000 mL Intravenous Once  . sodium chloride  3 mL Intravenous Q12H    Subjective: Tracy [name] is a 29 year old female with T1DM and hypothyroidism who was admitted 6/28 with worsening right arm pain and erythema; MRI at that time revealed myositis of the painful extremity. She was started on IV vancomycin and aztreonam, and underwent debridement in the OR on 7/3. Tissue cultures have grown Staph aureus but sensitivities have not yet been identified.  Today, Tracy [name] states she feels "about the same" and would "like to get to the bottom of what's going on." She states that her pain in her right arm in unchanged from yesterday and also complains of a "pulled muscle" sensation on her inner thigh which she has had for the past several weeks. Originally, she chalked up her arm and thigh pain to pulling muscles after throwing  horse feed; however, she also states she was scratched by a small kitten about 2 weeks before her arm pain and also had a bug bite several days before her arm pain began which she scratched often. She denies any other symptoms or complaints at this time.  Review of Systems: Pertinent items are noted in HPI.  Past Medical History  Diagnosis Date  . Thyroid enlargement     "not on medication at this time"  . Urinary tract infection     hx of  . Anemia     presently on iron supplement  . Diabetes mellitus     insulin pump   . Hypothyroidism   . Anxiety   . HSV-2 (herpes simplex virus 2) infection   . HSV-1 (herpes simplex virus 1) infection   . Detached retina   . Yeast infection     took diflucan saturday   . Hypertension   . Irregular periods 07/05/2014  . Vaginal discharge 07/05/2014    History  Substance Use Topics  . Smoking status: Never Smoker   . Smokeless tobacco: Never Used  . Alcohol Use: No    Family History  Problem Relation Age of Onset  . Cancer Paternal Grandfather     prostate  .  Hyperlipidemia Paternal Grandfather   . Stroke Paternal Grandfather    Allergies  Allergen Reactions  . Penicillins Hives and Swelling  . Sulfa Antibiotics Hives    OBJECTIVE: Blood pressure 158/75, pulse 99, temperature 98.9 F (37.2 C), temperature source Oral, resp. rate 17, height 5\' 3"  (1.6 m), weight 82 kg (180 lb 12.4 oz), last menstrual period 07/18/2014, SpO2 94 %. General: Resting comfortably. Right arm is in surgical bandages. Skin: Crusted papule on the right hypothenar dorsal hand with generalized edema in right extremity. Elected not to observe surgical site given recency of debridement. No lesions observed on the right medial thigh.  Lungs: Breathing comfortably. Cor: Regular rate and rhythm, no murmurs, gallops, or rub. Dialysis port on right neck.  Lab Results Lab Results  Component Value Date   WBC 25.5* 08/16/2014   HGB 7.0* 08/16/2014   HCT 20.9*  08/16/2014   MCV 83.3 08/16/2014   PLT 361 08/16/2014    Lab Results  Component Value Date   CREATININE 8.65* 08/16/2014   BUN 69* 08/16/2014   NA 132* 08/16/2014   K 4.6 08/16/2014   CL 104 08/16/2014   CO2 15* 08/16/2014    Lab Results  Component Value Date   ALT 18 08/10/2014   AST 38 08/10/2014   ALKPHOS 166* 08/10/2014   BILITOT 0.6 08/10/2014     Microbiology: Recent Results (from the past 240 hour(s))  Urine culture     Status: None   Collection Time: 08/09/14  1:57 PM  Result Value Ref Range Status   Specimen Description URINE, CLEAN CATCH  Final   Special Requests NONE  Final   Culture >=100,000 COLONIES/mL LACTOBACILLUS SPECIES  Final   Report Status 08/11/2014 FINAL  Final  Culture, blood (x 2)     Status: None   Collection Time: 08/09/14 11:42 PM  Result Value Ref Range Status   Specimen Description BLOOD LEFT HAND  Final   Special Requests BOTTLES DRAWN AEROBIC ONLY [place]  Final   Culture NO GROWTH 5 DAYS  Final   Report Status 08/15/2014 FINAL  Final  Culture, blood (x 2)     Status: None   Collection Time: 08/09/14 11:50 PM  Result Value Ref Range Status   Specimen Description BLOOD RIGHT HAND  Final   Special Requests   Final    BOTTLES DRAWN AEROBIC AND ANAEROBIC [place] BLUE 5CC PURPLE   Culture NO GROWTH 5 DAYS  Final   Report Status 08/15/2014 FINAL  Final  Anaerobic culture     Status: None (Preliminary result)   Collection Time: 08/14/14  2:32 PM  Result Value Ref Range Status   Specimen Description FLUID  Final   Special Requests RIGHT ARM FLUID A  Final   Gram Stain   Final    MODERATE WBC PRESENT,BOTH PMN AND MONONUCLEAR NO SQUAMOUS EPITHELIAL CELLS SEEN RARE GRAM POSITIVE COCCI IN PAIRS IN CLUSTERS Performed at [facility]    Culture   Final    NO ANAEROBES ISOLATED; CULTURE IN PROGRESS FOR 5 DAYS Performed at [facility]    Report Status PENDING  Incomplete  Culture, routine-abscess     Status: None (Preliminary  result)   Collection Time: 08/14/14  2:32 PM  Result Value Ref Range Status   Specimen Description ARM RIGHT  Final   Special Requests NONE  Final   Gram Stain   Final    FEW WBC PRESENT, PREDOMINANTLY PMN NO SQUAMOUS EPITHELIAL CELLS SEEN RARE GRAM POSITIVE  COCCI IN CLUSTERS Performed at [facility]    Culture   Final    FEW STAPHYLOCOCCUS AUREUS Note: RIFAMPIN AND GENTAMICIN SHOULD NOT BE USED AS SINGLE DRUGS FOR TREATMENT OF STAPH INFECTIONS. Performed at [facility]    Report Status PENDING  Incomplete  Anaerobic culture     Status: None (Preliminary result)   Collection Time: 08/14/14  2:36 PM  Result Value Ref Range Status   Specimen Description TISSUE  Final   Special Requests RIGHT ARM B  Final   Gram Stain   Final    NO WBC SEEN NO SQUAMOUS EPITHELIAL CELLS SEEN RARE GRAM POSITIVE COCCI IN PAIRS IN CLUSTERS Performed at [facility]    Culture   Final    NO ANAEROBES ISOLATED; CULTURE IN PROGRESS FOR 5 DAYS Performed at [facility]    Report Status PENDING  Incomplete  Tissue culture     Status: None (Preliminary result)   Collection Time: 08/14/14  2:36 PM  Result Value Ref Range Status   Specimen Description TISSUE  Final   Special Requests RIGHT ARM B  Final   Gram Stain   Final    RARE WBC PRESENT, PREDOMINANTLY PMN NO SQUAMOUS EPITHELIAL CELLS SEEN RARE GRAM POSITIVE COCCI IN PAIRS Performed at [facility]    Culture   Final    MODERATE STAPHYLOCOCCUS AUREUS Note: RIFAMPIN AND GENTAMICIN SHOULD NOT BE USED AS SINGLE DRUGS FOR TREATMENT OF STAPH INFECTIONS. Performed at [facility]    Report Status PENDING  Incomplete    Assessment: Tracy [name] is a 29 year old female with pyomyositis of the right arm with cultures positive for Staph aureus, possibly secondary to an arthropod bite which she subsequently picked on her right dorsal hand in combination with immunosuppression from her longstanding  T1DM; however, cardiac emboli cannot be ruled out yet. Since changing her antibiotics from vancomycin/aztreonam to vancomycin/clindamycin, her WBC has spiked from 18 to 25 but otherwise she is symptomatically stable. Because the infectious etiology has been narrowed to S. Aureus and there are no signs of aneorobic growth at this time, we recommend discontinuing her clindamycin. Additionally, although we did not appreciate any murmurs on cardiac exam, we will follow up on the results of her transthoracicl echo for cardiac emboli.   Plan: 1. Discontinue clindamycin 2. Continue vancomycin 3. We will f/u results of TTE  Thank you for your consultation.  Loleta Chance, MD [facility] [place] [facility] 08/16/2014, 1:41 PM   Addendum:   I have seen and examined [name] and discussed her case with Dr. Melburn Hake. She is improving following incision and drainage of her right arm infection 2 days ago. Operative cultures have grown staph aureus. Interventional radiology is planning on aspirating her right thigh fluid collection tomorrow morning. We will continue IV vancomycin alone pending final susceptibility results.   Michel Bickers, MD [facility] [place] [facility] [phone] pager   [phone] cell 08/16/2014, 4:04 PM

## 2014-08-16 NOTE — Progress Notes (Signed)
Inpatient Diabetes Program Recommendations  AACE/ADA: New Consensus Statement on Inpatient Glycemic Control (2013)  Target Ranges:  Prepandial:   less than 140 mg/dL      Peak postprandial:   less than 180 mg/dL (1-2 hours)      Critically ill patients:  140 - 180 mg/dL   Pt with numerous low cbg's throughout the day. Please consider not using any lantus at this time and use sensitive q 4 hrs if needed. HgbA1C noted to be elevated, however patient's low hemoglobin levels may influence the actual result.  Will follow. Thank you Rosita Kea, RN, MSN, CDE  Diabetes Inpatient Program Office: [phone] Pager: [phone] 8:00 am to 5:00 pm

## 2014-08-16 NOTE — Progress Notes (Signed)
Occupational Therapy Treatment Patient Details Name: AIYONNA NOBEL MRN: [id number] DOB: 01/15/1986 Today's Date: 08/16/2014    History of present illness Myositis of the right arm and right thigh. Most likely post physical strain 2 weeks ago while throwing horse feed.   OT comments  Pt making progress with functional goals and should continue with acute OT services to increase level of function and safety. Pt states that she is "nervous" about HD line and just feels very tired and uncomfortable  Follow Up Recommendations  No OT follow up (therapy for R UE as appropriate per MD as needed)    Equipment Recommendations  None recommended by OT    Recommendations for Other Services      Precautions / Restrictions Precautions Precautions: None Restrictions Weight Bearing Restrictions: No       Mobility Bed Mobility Overal bed mobility: Needs Assistance Bed Mobility: Supine to Sit;Sit to Supine     Supine to sit: Supervision Sit to supine: Min assist   General bed mobility comments: min A with r LE back onto bed, assist for scooting to [facility] and positioning once back in bed  Transfers Overall transfer level: Needs assistance Equipment used:  (IV pole) Transfers: Sit to/from Stand Sit to Stand: Supervision Stand pivot transfers: Min assist       General transfer comment: sup for saefty, no physical asssit required    Balance Overall balance assessment: Needs assistance Sitting-balance support: Feet supported Sitting balance-Leahy Scale: Fair   Postural control: Posterior lean Standing balance support: Bilateral upper extremity supported;Single extremity supported;During functional activity Standing balance-Leahy Scale: Fair                     ADL       Grooming: Wash/dry hands;Wash/dry face;Set up;Standing;Supervision/safety       Lower Body Bathing: Minimal assistance;Sit to/from stand       Lower Body Dressing: Moderate assistance;Sit to/from  stand   Toilet Transfer: Supervision/safety;Ambulation;Regular Toilet;Grab bars   Toileting- Clothing Manipulation and Hygiene: Modified independent;Sitting/lateral lean;Sit to/from stand       Functional mobility during ADLs: Supervision/safety        Vision  no visual deficits                              Cognition   Behavior During Therapy: WFL for tasks assessed/performed Overall Cognitive Status: Within Functional Limits for tasks assessed                       Extremity/Trunk Assessment   R UE impaired, in sling/splint            Exercises  ROM R digits          General Comments  pt pleasant and cooperative, family supportive    Pertinent Vitals/ Pain       Pain Assessment: 0-10 Pain Score: 6  Faces Pain Scale: Hurts whole lot Pain Location: R UE, R LE, R neck area (HD line) Pain Descriptors / Indicators: Aching;Sore;Discomfort Pain Intervention(s): Monitored during session;Repositioned  Home Living  home alone                                        Prior Functioning/Environment  independent           Frequency Min 2X/week  Progress Toward Goals  OT Goals(current goals can now be found in the care plan section)  Progress towards OT goals: Progressing toward goals  Acute Rehab OT Goals Patient Stated Goal: feel better and go home  Plan Discharge plan remains appropriate    Co-evaluation    PT/OT/SLP Co-Evaluation/Treatment: Yes Reason for Co-Treatment: Complexity of the patient's impairments (multi-system involvement);For patient/therapist safety   OT goals addressed during session: ADL's and self-care      End of Session Equipment Utilized During Treatment: Gait belt   Activity Tolerance Patient limited by pain   Patient Left in bed;with call bell/phone within reach;with family/visitor present             Time: [id number] OT Time Calculation (min): 28 min  Charges: OT General  Charges $OT Visit: 1 Procedure OT Treatments $Self Care/Home Management : 8-22 mins  Britt Bottom 08/16/2014, 2:28 PM

## 2014-08-16 NOTE — Procedures (Signed)
Patient seen on Hemodialysis. QB 200, UF goal 1.5L Treatment adjusted as needed.  Elmarie Shiley MD [facility]. Office # [phone] Pager # [phone] 8:14 AM

## 2014-08-16 NOTE — Consult Note (Signed)
Chief Complaint: Chief Complaint  Patient presents with  . Arm Pain  Rt arm myositis and Rt leg myositis   Referring Physician(s): Dr Johnnye Sima [facility]  History of Present Illness: Tracy [name] is a 29 y.o. 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 Thyroid enlargement     "not on medication at this time"  . Urinary tract infection     hx of  . Anemia     presently on iron supplement  . Diabetes mellitus     insulin pump   . Hypothyroidism   . Anxiety   . HSV-2 (herpes simplex virus 2) infection   . HSV-1 (herpes simplex virus 1) infection   . Detached retina   . Yeast infection     took diflucan saturday   . Hypertension   . Irregular periods 07/05/2014  . Vaginal discharge 07/05/2014    Past Surgical History  Procedure Laterality Date  . Cholecystectomy    . Pilonidal cyst excision    . Wisdom tooth extraction    . Pars plana vitrectomy  10/01/2011    Procedure: PARS PLANA VITRECTOMY WITH 25 GAUGE;  Surgeon: Hayden Pedro, MD;  Location: [place];  Service: Ophthalmology;  Laterality: Right;  Repair Complex Traction Retinal Detachment  . Trigger finger release Right 09/21/13  . I&d extremity Right 08/14/2014    Procedure: IRRIGATION AND DEBRIDEMENT EXTREMITY WITH MUSCLE BIOPSY;  Surgeon: Roseanne Kaufman, MD;  Location: [place];  Service: Orthopedics;  Laterality: Right;     Allergies: Penicillins and Sulfa antibiotics  Medications: Prior to Admission medications   Medication Sig Start Date End Date Taking? Authorizing Provider  acyclovir (ZOVIRAX) 400 MG tablet Take 400 mg by mouth 2 (two) times daily as needed. Fever blisters   Yes Historical Provider, MD  amLODipine (NORVASC) 10 MG tablet Take 10 mg by mouth daily.   Yes Historical Provider, MD  furosemide (LASIX) 20 MG tablet Take 20 mg by mouth 2 (two) times daily as needed for fluid.    Yes Historical Provider, MD  HYDROcodone-acetaminophen (NORCO/VICODIN) 5-325 MG per tablet Take 1 tablet by mouth every 4 (four) hours as needed for moderate pain. 08/08/14  Yes Carole Civil, MD  ibuprofen (ADVIL,MOTRIN) 800 MG tablet Take 1 tablet (800 mg total) by mouth 3 (three) times daily. 08/05/14  Yes Orpah Greek, MD  insulin aspart (NOVOLOG FLEXPEN) 100 UNIT/ML FlexPen Inject 5 Units into the skin 3 (three) times daily with meals.    Yes Historical Provider, MD  Insulin Glargine (LANTUS SOLOSTAR) 100 UNIT/ML SOPN Inject 28 Units into the skin at bedtime. Patient taking differently: Inject 15 Units into the skin every morning.  01/22/13  Yes Samuella Cota, MD  levothyroxine (SYNTHROID, LEVOTHROID) 88 MCG tablet Take 1 tablet (88 mcg total) by mouth daily before breakfast. 01/25/14  Yes Verlee Monte, MD  Multiple Vitamins-Minerals (MULTI-VITAMIN GUMMIES PO) Take  by mouth daily.   Yes Historical Provider, MD  promethazine (PHENERGAN) 12.5 MG tablet Take 1 tablet (12.5 mg total) by mouth every 4 (four) hours as needed for nausea or vomiting. 08/08/14  Yes Carole Civil, MD  fluconazole (DIFLUCAN) 150 MG tablet Take 1 now and 1 in 3 days Patient not taking: Reported on 08/09/2014 07/05/14   Estill Dooms, NP     Family History  Problem Relation Age of Onset  . Cancer Paternal Grandfather     prostate  . Hyperlipidemia Paternal Grandfather   . Stroke Paternal Grandfather     History    Social History  . Marital Status: Legally Separated    Spouse Name: Audelia Acton  . Number of Children: 0  . Years of Education: College   Occupational History  .  [employer]   Social History Main Topics  . Smoking status: Never Smoker   . Smokeless tobacco: Never Used  . Alcohol Use: No  . Drug Use: No  . Sexual Activity: Not Currently    Birth Control/ Protection: None   Other Topics Concern  . None   Social History Narrative   Patient lives at home with family.   Caffeine Use: 1 soda daily    Review of Systems: A 12 point ROS discussed and pertinent positives are indicated in the HPI above.  All other systems are negative.  Review of Systems  Constitutional: Positive for activity change and appetite change. Negative for fever.  Respiratory: Negative for shortness of breath.   Gastrointestinal: Positive for abdominal pain.  Musculoskeletal: Positive for back pain and gait problem.  Neurological: Positive for weakness. Negative for dizziness.  Psychiatric/Behavioral: Negative for behavioral problems and confusion.    Vital Signs: BP 158/75 mmHg  Pulse 99  Temp(Src) 98.9 F (37.2 C) (Oral)  Resp 17  Ht 5\' 3"  (1.6 m)  Wt 180 lb 12.4 oz (82 kg)  BMI 32.03 kg/m2  SpO2 94%  LMP 07/18/2014  Physical Exam  Constitutional: She is oriented to person, place, and time.  Cardiovascular: Normal rate, regular rhythm and normal heart sounds.   Pulmonary/Chest: Effort normal and breath sounds normal. She has no wheezes.  Abdominal: Soft. Bowel sounds are normal.  Musculoskeletal: She exhibits edema and tenderness.  Rt arm in sling and post op bandages Rt leg stiff/painful; swollen   Neurological: She is alert and oriented to person, place, and time.  Skin: Skin is warm and dry.  Psychiatric: She has a normal mood and affect. Her behavior is normal. Judgment and thought content normal.  Nursing note and vitals reviewed.   Mallampati Score:  MD Evaluation Airway: WNL Heart:  WNL Abdomen: WNL Chest/ Lungs: WNL ASA  Classification: 2, 3 Mallampati/Airway Score: One  Imaging: Dg Elbow Complete Right  08/05/2014   CLINICAL DATA:  Right elbow pain that radiates to the shoulder. Pain started after picking up horse feed.  EXAM: RIGHT ELBOW - COMPLETE 3+ VIEW  COMPARISON:  None.  FINDINGS: There is no evidence of fracture, dislocation, or joint effusion. There is no evidence of arthropathy or other focal bone abnormality. Soft tissues are unremarkable.  IMPRESSION: Negative.   Electronically Signed   By: Markus Daft M.D.   On: 08/05/2014 08:05   Mr Humerus Right Wo Contrast  08/12/2014   CLINICAL DATA:  Myositis in the right arm.  Injury 2 weeks ago.  EXAM: MRI OF THE RIGHT HUMERUS WITHOUT CONTRAST  TECHNIQUE: Multiplanar, multisequence MR imaging was performed. No intravenous contrast  was administered.  COMPARISON:  08/10/2014  FINDINGS: Dependent atelectasis in the right lower lobe.  There is considerable abnormal edema in the lateral, long, and medial head of the triceps diffusely. The internal tendon of the medial head seems torn on images 20 through 23 of series 7, with an associated 2.2 by 0.6 by 1.4 cm fluid collection along the proximal margin of the tendon.  There is edema in the coracobrachialis muscle tendon a small medial portion of the brachialis muscle.  Left axillary lymph nodes measure up to 1.2 cm in short axis. Edema tracks along the axillary neurovascular structures.  Fluid signal tracks superficial to the posterior deltoid muscle and superficial to the latissimus dorsi and infraspinatus and teres minor muscles.  Subcutaneous edema tracks in the upper arm circumferentially, somewhat more confluent anteriorly.  Small elbow joint effusion.  No significant abnormal osseous edema.  IMPRESSION: 1. Again observed is extensive myositis primarily involving the triceps and coracobrachialis muscles, with a small intrasubstance fluid collection along the proximal tendinous  margin of the medial head of the triceps which may represent musculotendinous tear or less likely a very small abscess. This collection is similar to prior. Possibilities include posttraumatic myositis, infection, or a vascular etiology. Mild reactive axillary adenopathy on the right may suggest an infectious component. 2. Subcutaneous edema in the upper arm, mildly increased. Cellulitis not excluded. 3. Edema tracks along the superficial margin of the deltoid muscle, teres minor, and infraspinatus as well as the latissimus dorsi. This may be incidental but shear injury cannot be excluded.   Electronically Signed   By: Van Clines M.D.   On: 08/12/2014 08:46   Mr Humerus Right Wo Contrast  08/10/2014   CLINICAL DATA:  Right arm pain after an injury throwing a bag of horse feed 2 weeks ago.  EXAM: MRI OF THE RIGHT HUMERUS WITHOUT CONTRAST  TECHNIQUE: Multiplanar, multisequence MR imaging was performed. No intravenous contrast was administered.  COMPARISON:  MRI dated 08/08/2014 and radiograph dated 05/05/2014  FINDINGS: The edema in the right triceps muscle is even more extensive than on the prior study. The muscle is larger than on the prior study. There is only slight sparing of the proximal aspect of the muscle. Subcutaneous edema is more prominent. The underlying humerus and the neurovascular bundle are normal. The focal area of abnormal signal at the distal mild tendinous junction is unchanged.  IMPRESSION: Progressive edema of the triceps muscle consistent with myositis. Focal area of abnormal signal at the mild tendinous junction could represent a tear or small abscess but this is not progressed. Given the patient's elevated white blood count, infectious myositis must be considered.   Electronically Signed   By: Lorriane Shire M.D.   On: 08/10/2014 08:50   Mr Humerus Right Wo Contrast  08/09/2014   CLINICAL DATA:  Pain throughout entire right humerus for 2 weeks.  EXAM: MRI OF THE RIGHT HUMERUS  WITHOUT CONTRAST  TECHNIQUE: Multiplanar, multisequence MR imaging was performed. No intravenous contrast was administered.  COMPARISON:  None.  FINDINGS: No marrow signal abnormality. No fracture or dislocation. There is no bone destruction or periosteal reaction.  There is severe edema within the right triceps muscle with small areas of sparing proximally. At the distal musculotendinous junction of the medial head of the triceps, there is a complex 2 x 2 x 2.3 cm T2 signal abnormality with central T2 hyperintensity and peripheral intermediate signal. There is mild edema in the adjacent subcutaneous fat.  The biceps musculature  is normal. There is no other fluid collection or soft tissue mass. The neurovascular bundles are grossly normal. There are multiple prominent right axillary lymph nodes.  IMPRESSION: 1. Severe edema within the right triceps muscle with small areas of sparing proximally. At the distal musculotendinous junction of the medial head of the triceps, there is a complex 2 x 2 x 2.3 cm T2 signal abnormality. The overall appearance is concerning for myositis which may be secondary to an infectious or inflammatory etiology. The 2 cm area of focal abnormal signal which may reflect a hematoma secondary to a partial tear given the location is at the musculotendinous junction of the medial head of the triceps, but given the extensiveness of the muscle edema, this may reflect a developing abscess versus myonecrosis.   Electronically Signed   By: Kathreen Devoid   On: 08/09/2014 08:31   US Renal  08/10/2014   CLINICAL DATA:  Acute kidney injury.  Elevated creatinine level.  EXAM: RENAL / URINARY TRACT ULTRASOUND COMPLETE  COMPARISON:  CT examination 03/09/2013 and ultrasound 10/04/2008  FINDINGS: Right Kidney:  Length: 10.6 cm. The right kidney parenchyma is echogenic without hydronephrosis. There is a heterogeneous hypoechoic structure along the upper pole measuring up to 1.1 cm. Previously, the patient had  an abscess in the right kidney and unclear if this sequela from the abscess or a new small complex lesion.  Left Kidney:  Length: 10.6 cm. Left kidney is not well visualized but appears to be echogenic. Negative for hydronephrosis.  Bladder:  Appears normal for degree of bladder distention.  IMPRESSION: Negative for hydronephrosis.  Increased echogenicity of both kidneys. Findings raise concern for chronic medical renal disease.  There is a 1.1 cm heterogeneous hypoechoic structure in the right kidney upper pole. Unclear if this is the sequelae of previous renal abscess versus new small complex cystic lesion. Evaluation of this area will be difficult due to acute kidney injury and an elevated creatinine level. However, this may be better characterized with a non contrast MRI versus close surveillance with ultrasound.   Electronically Signed   By: Markus Daft M.D.   On: 08/10/2014 11:34   Ct Femur Right Wo Contrast  08/10/2014   CLINICAL DATA:  Myositis. Sudden onset of pain while lifting a feed bag 2 weeks ago.  EXAM: CT OF THE LOWER RIGHT EXTREMITY WITHOUT CONTRAST  TECHNIQUE: Multidetector CT imaging of the right femur and right tibia fibula was performed according to the standard protocol.  COMPARISON:  None.  FINDINGS: There is no bony abnormality. There is no radiopaque foreign body. There is no soft tissue mass. There is no abscess or drainable fluid collection. There is no soft tissue gas.  There is soft tissue edema around the muscles of the right lower extremity, particularly around the adductor of the via and the quadriceps musculature. This is consistent with the clinically described suspicion of myositis. There is a small knee joint effusion.  IMPRESSION: Soft tissue edema surrounding the musculature of the right lower extremity consistent with myositis. No mass or drainable fluid collection.   Electronically Signed   By: Andreas Newport M.D.   On: 08/10/2014 03:49   Ct Tibia Fibula Right Wo  Contrast  08/10/2014   CLINICAL DATA:  Myositis. Sudden onset of pain while lifting a feed bag 2 weeks ago.  EXAM: CT OF THE LOWER RIGHT EXTREMITY WITHOUT CONTRAST  TECHNIQUE: Multidetector CT imaging of the right femur and right tibia fibula was performed according to the  standard protocol.  COMPARISON:  None.  FINDINGS: There is no bony abnormality. There is no radiopaque foreign body. There is no soft tissue mass. There is no abscess or drainable fluid collection. There is no soft tissue gas.  There is soft tissue edema around the muscles of the right lower extremity, particularly around the adductor of the via and the quadriceps musculature. This is consistent with the clinically described suspicion of myositis. There is a small knee joint effusion.  IMPRESSION: Soft tissue edema surrounding the musculature of the right lower extremity consistent with myositis. No mass or drainable fluid collection.   Electronically Signed   By: Andreas Newport M.D.   On: 08/10/2014 03:49   Mr Femur Right Wo Contrast  08/12/2014   CLINICAL DATA:  Myositis of the right arm and right thigh. Status post physical strain 2 weeks ago.  EXAM: MRI OF THE RIGHT FEMUR WITHOUT CONTRAST  TECHNIQUE: Multiplanar, multisequence MR imaging of the right femur was performed. No intravenous contrast was administered.  COMPARISON:  CT right femur 08/10/2014  FINDINGS: There is severe muscle edema within the vastus medialis and intermedius muscle. There is a 14 mm more focal area of fluid signal within the vastus medialis which may reflect a more focal edema versus a small fluid collection. There is mild muscle edema within the vastus lateralis and rectus femoris. There is severe muscle edema within the sartorius muscle. There is a small amount of fluid along the fascia of the flexor compartment. There is small amount of fluid along the fascia of the flexor compartment and around the adductor compartment. There is no drainable focal fluid  collection. There is no hematoma.  There is no focal marrow signal abnormality. There is no fracture or subluxation. No periosteal reaction.  IMPRESSION: 1. Severe muscle edema within the vastus medialis, vastus intermedius and sartorius muscles most consistent with myositis secondary to an infectious or inflammatory etiology. 14 mm more focal area of fluid signal within the vastus medialis which may reflect a more focal edema versus a small fluid collection. Low level edema within the vastus lateralis and rectus femoris likely reflecting myositis to a much lesser degree.   Electronically Signed   By: Kathreen Devoid   On: 08/12/2014 08:57   Ir Fluoro Guide Cv Line Right  08/15/2014   CLINICAL DATA:  Diabetes, acute renal insufficiency  EXAM: EXAM RIGHT IJ CATHETER PLACEMENT UNDER ULTRASOUND AND FLUOROSCOPIC GUIDANCE  TECHNIQUE: The procedure, risks (including but not limited to bleeding, infection, organ damage, pneumothorax), benefits, and alternatives were explained to the patient. Questions regarding the procedure were encouraged and answered. The patient understands and consents to the procedure. Patency of the right IJ vein was confirmed with ultrasound with image documentation. An appropriate skin site was determined. Skin site was marked. Region was prepped using maximum barrier technique including cap and mask, sterile gown, sterile gloves, large sterile sheet, and Chlorhexidine as cutaneous antisepsis. The region was infiltrated locally with 1% lidocaine. Under real-time ultrasound guidance, the right IJ vein was accessed with a 19 gauge needle; the needle tip within the vein was confirmed with ultrasound image documentation. The needle exchanged over a guidewire for vascular dilator which allowed advancement of a 20 cm Trialysis catheter. This was positioned with the tip at the cavoatrial junction. Spot chest radiograph shows good positioning and no pneumothorax. Catheter was flushed and sutured externally  with 0-Prolene sutures. Patient tolerated the procedure well.  FLUOROSCOPY TIME:  1.1 mGy, 6 seconds  COMPLICATIONS:  COMPLICATIONS none  IMPRESSION: 1. Technically successful right IJ Trialysis catheter placement.   Electronically Signed   By: Lucrezia Europe M.D.   On: 08/15/2014 11:00   Ir US Guide Vasc Access Right  08/15/2014   CLINICAL DATA:  Diabetes, acute renal insufficiency  EXAM: EXAM RIGHT IJ CATHETER PLACEMENT UNDER ULTRASOUND AND FLUOROSCOPIC GUIDANCE  TECHNIQUE: The procedure, risks (including but not limited to bleeding, infection, organ damage, pneumothorax), benefits, and alternatives were explained to the patient. Questions regarding the procedure were encouraged and answered. The patient understands and consents to the procedure. Patency of the right IJ vein was confirmed with ultrasound with image documentation. An appropriate skin site was determined. Skin site was marked. Region was prepped using maximum barrier technique including cap and mask, sterile gown, sterile gloves, large sterile sheet, and Chlorhexidine as cutaneous antisepsis. The region was infiltrated locally with 1% lidocaine. Under real-time ultrasound guidance, the right IJ vein was accessed with a 19 gauge needle; the needle tip within the vein was confirmed with ultrasound image documentation. The needle exchanged over a guidewire for vascular dilator which allowed advancement of a 20 cm Trialysis catheter. This was positioned with the tip at the cavoatrial junction. Spot chest radiograph shows good positioning and no pneumothorax. Catheter was flushed and sutured externally with 0-Prolene sutures. Patient tolerated the procedure well.  FLUOROSCOPY TIME:  1.1 mGy, 6 seconds  COMPLICATIONS: COMPLICATIONS none  IMPRESSION: 1. Technically successful right IJ Trialysis catheter placement.   Electronically Signed   By: Lucrezia Europe M.D.   On: 08/15/2014 11:00   Dg Femur, Min 2 Views Right  08/10/2014   CLINICAL DATA:  Right thigh  pain. Concern for myositis. Rule out soft tissue air.  EXAM: RIGHT FEMUR 2 VIEWS  COMPARISON:  None.  FINDINGS: Cortical margins of the right femur are intact. No fracture, focal osseous lesion, erosion or periosteal reaction. No soft tissue air or radiopaque foreign body. Questionable soft tissue edema proximally.  IMPRESSION: Suspect mild soft tissue edema proximally. No soft tissue air, radiopaque foreign body, or acute osseous abnormality.   Electronically Signed   By: Jeb Levering M.D.   On: 08/10/2014 01:48    Labs:  CBC:  Recent Labs  08/12/14 0538 08/13/14 0540 08/14/14 0846 08/16/14 0634  WBC 15.0* 15.0* 18.4* 25.5*  HGB 7.3* 7.3* 8.2* 7.0*  HCT 22.8* 22.3* 25.1* 20.9*  PLT 276 291 204 361    COAGS:  Recent Labs  08/09/14 2350 08/16/14 0635  INR 1.31 1.40  APTT 42* 39*    BMP:  Recent Labs  08/12/14 0538 08/13/14 0342 08/14/14 0846 08/16/14 0634  NA 131* 130* 132* 132*  K 4.6 4.8 4.3 4.6  CL 102 102 104 104  CO2 15* 12* 17* 15*  GLUCOSE 215* 223* 103* 89  BUN 47* 54* 58* 69*  CALCIUM 8.5* 8.4* 8.7* 7.6*  CREATININE 5.91* 6.37* 7.33* 8.65*  GFRNONAA 9* 8* 7* 6*  GFRAA 10* 9* 8* 6*    LIVER FUNCTION TESTS:  Recent Labs  01/20/14 1140 01/24/14 0220 08/09/14 1819 08/10/14 0128 08/12/14 0538  BILITOT 0.4 <0.2* 0.5 0.6  --   AST 19 11 28  38  --   ALT 20 10 15 18   --   ALKPHOS 132* 97 127* 166*  --   PROT 7.6 5.8* 7.6 5.7*  --   ALBUMIN 3.6 2.4* 2.9* 2.2* 1.8*    TUMOR MARKERS: No results for input(s): AFPTM, CEA, CA199, CHROMGRNA in the last 8760 hours.  Assessment and Plan:  Rt arm and leg myositis Scheduled for Rt thigh collection aspiration and possible muscle biopsy Risks and Benefits discussed with the patient including, but not limited to bleeding, infection, damage to adjacent structures or low yield requiring additional tests. All of the patient's questions were answered, patient is agreeable to proceed. Consent signed and in  chart.   Thank you for this interesting consult.  I greatly enjoyed meeting NILYNN ARIZA and look forward to participating in their care.  Signed: Langdon Crosson A 08/16/2014, 12:55 PM   I spent a total of 40 Minutes    in face to face in clinical consultation, greater than 50% of which was counseling/coordinating care for Rt thigh collection aspiration/biopsy

## 2014-08-16 NOTE — Progress Notes (Signed)
  Echocardiogram 2D Echocardiogram has been performed.  Jennette Dubin 08/16/2014, 11:35 AM

## 2014-08-16 NOTE — Progress Notes (Signed)
Pt alert, completed HD with no complications. 1L fluid removed. Vss. Report given. Pt returned safely to room.

## 2014-08-16 NOTE — Progress Notes (Signed)
Patient ID: Tracy [name], female   DOB: [birth year], 29 y.o.   MRN: [id number]  [facility] Progress Note    Assessment/ Plan:   1. Acute renal failure on chronic kidney disease stage IV: Suspected to be hemodynamically mediated versus multifactorial ATN including NSAIDs. Noted to be having uremic symptoms emerge yesterday and planned for dialysis catheter placement by radiology-discussed with IR, temporary catheter placement at this time is preferable due to her leukocytosis. Hemodialysis today-missed yesterday. Plan for HD tomorrow 2. Myositis right arm/right thigh-status post open biopsy of right arm and placement of JP drain by surgery- cultures with Staph and GPC in pairs. Appreciate input from ID-- now on Clindamycin in addition to Vanco (levels high on labs today). Symptomatically better 3. Anemia: Secondary to infection/ESA resistance and some surgical losses-continue to trend hemoglobin levels for possible need to transfuse PRBCs 4. Hyponatremia: Most likely due to acute renal failure and defective free water handling-monitor with dialysis  Subjective:   Reports to be doing somewhat better today with regards to arm/thigh pain   Objective:   BP 177/93 mmHg  Pulse 96  Temp(Src) 98.9 F (37.2 C) (Oral)  Resp 18  Ht 5\' 3"  (1.6 m)  Wt 83 kg (182 lb 15.7 oz)  BMI 32.42 kg/m2  SpO2 96%  LMP 07/18/2014  Intake/Output Summary (Last 24 hours) at 08/16/14 [id number] Last data filed at 08/16/14 [id number]  Gross per 24 hour  Intake      0 ml  Output    100 ml  Net   -100 ml   Weight change:   Physical Exam: [id number] on HD CVS: Pulse RRR, normal s1 and s2 Resp:CTA bilaterally, no rales/rhonchi [id number], flat, NT, BS normal Ext: Trace LE edema with dressing/drain right arm  Imaging: Ir Fluoro Guide Cv Line Right  08/15/2014   CLINICAL DATA:  Diabetes, acute renal insufficiency  EXAM: EXAM RIGHT IJ CATHETER PLACEMENT UNDER ULTRASOUND AND FLUOROSCOPIC GUIDANCE   TECHNIQUE: The procedure, risks (including but not limited to bleeding, infection, organ damage, pneumothorax), benefits, and alternatives were explained to the patient. Questions regarding the procedure were encouraged and answered. The patient understands and consents to the procedure. Patency of the right IJ vein was confirmed with ultrasound with image documentation. An appropriate skin site was determined. Skin site was marked. Region was prepped using maximum barrier technique including cap and mask, sterile gown, sterile gloves, large sterile sheet, and Chlorhexidine as cutaneous antisepsis. The region was infiltrated locally with 1% lidocaine. Under real-time ultrasound guidance, the right IJ vein was accessed with a 19 gauge needle; the needle tip within the vein was confirmed with ultrasound image documentation. The needle exchanged over a guidewire for vascular dilator which allowed advancement of a 20 cm Trialysis catheter. This was positioned with the tip at the cavoatrial junction. Spot chest radiograph shows good positioning and no pneumothorax. Catheter was flushed and sutured externally with 0-Prolene sutures. Patient tolerated the procedure well.  FLUOROSCOPY TIME:  1.1 mGy, 6 seconds  COMPLICATIONS: COMPLICATIONS none  IMPRESSION: 1. Technically successful right IJ Trialysis catheter placement.   Electronically Signed   By: Lucrezia Europe M.D.   On: 08/15/2014 11:00   Ir US Guide Vasc Access Right  08/15/2014   CLINICAL DATA:  Diabetes, acute renal insufficiency  EXAM: EXAM RIGHT IJ CATHETER PLACEMENT UNDER ULTRASOUND AND FLUOROSCOPIC GUIDANCE  TECHNIQUE: The procedure, risks (including but not limited to bleeding, infection, organ damage, pneumothorax), benefits, and alternatives were explained to the patient. Questions regarding  the procedure were encouraged and answered. The patient understands and consents to the procedure. Patency of the right IJ vein was confirmed with ultrasound with image  documentation. An appropriate skin site was determined. Skin site was marked. Region was prepped using maximum barrier technique including cap and mask, sterile gown, sterile gloves, large sterile sheet, and Chlorhexidine as cutaneous antisepsis. The region was infiltrated locally with 1% lidocaine. Under real-time ultrasound guidance, the right IJ vein was accessed with a 19 gauge needle; the needle tip within the vein was confirmed with ultrasound image documentation. The needle exchanged over a guidewire for vascular dilator which allowed advancement of a 20 cm Trialysis catheter. This was positioned with the tip at the cavoatrial junction. Spot chest radiograph shows good positioning and no pneumothorax. Catheter was flushed and sutured externally with 0-Prolene sutures. Patient tolerated the procedure well.  FLUOROSCOPY TIME:  1.1 mGy, 6 seconds  COMPLICATIONS: COMPLICATIONS none  IMPRESSION: 1. Technically successful right IJ Trialysis catheter placement.   Electronically Signed   By: Lucrezia Europe M.D.   On: 08/15/2014 11:00    Labs: BMET  Recent Labs Lab 08/09/14 1819 08/09/14 2350 08/10/14 0128 08/11/14 0507 08/12/14 [id number] 08/13/14 0342 08/14/14 0846 08/16/14 0634  NA 136  --  135 132* 131* 130* 132* 132*  K 4.5  --  4.0 4.7 4.6 4.8 4.3 4.6  CL 105  --  103 105 102 102 104 104  CO2 22  --  21* 17* 15* 12* 17* 15*  GLUCOSE 68  --  101* 176* 215* 223* 103* 89  BUN 41*  --  39* 42* 47* 54* 58* 69*  CREATININE 5.46*  --  5.33* 5.39* 5.91* 6.37* 7.33* 8.65*  CALCIUM 9.0  --  8.2* 8.5* 8.5* 8.4* 8.7* 7.6*  PHOS  --  4.8*  --   --  6.0* 6.4* 6.5*  --    CBC  Recent Labs Lab 08/09/14 1819  08/11/14 0507 08/12/14 0538 08/13/14 0540 08/14/14 0846 08/16/14 0634  WBC 22.6*  < > 16.9* 15.0* 15.0* 18.4* 25.5*  NEUTROABS 19.5*  --  14.2*  --  12.6*  --   --   HGB 9.5*  < > 7.4* 7.3* 7.3* 8.2* 7.0*  HCT 28.5*  < > 23.0* 22.8* 22.3* 25.1* 20.9*  MCV 82.6  < > 84.2 83.8 82.9 83.9 83.3   PLT 308  < > 250 276 291 204 361  < > = values in this interval not displayed.  Medications:    . sodium chloride   Intravenous Once  . amLODipine  10 mg Oral Daily  . clindamycin (CLEOCIN) IV  900 mg Intravenous Q8H  . dextrose      . famotidine  40 mg Oral BID  . furosemide  20 mg Intravenous Once  . heparin  5,000 Units Subcutaneous 3 times per day  . insulin aspart  0-5 Units Subcutaneous QHS  . insulin aspart  0-9 Units Subcutaneous TID WC  . insulin glargine  20 Units Subcutaneous Daily  . levothyroxine  125 mcg Oral QAC breakfast  . metoprolol tartrate  50 mg Oral BID  . multivitamin with minerals  1 tablet Oral Daily  . sodium bicarbonate  1,300 mg Oral BID  . sodium chloride  1,000 mL Intravenous Once  . sodium chloride  3 mL Intravenous Q12H    Elmarie Shiley, MD 08/16/2014, 8:22 AM

## 2014-08-16 NOTE — Progress Notes (Signed)
Physical Therapy Treatment Patient Details Name: Tracy [name] MRN: [id number] DOB: 1985-07-08 Today's Date: 08/16/2014    History of Present Illness Myositis of the right arm and right thigh. Most likely post physical strain 2 weeks ago while throwing horse feed.    PT Comments    Pt has loose tape on her neck and was off her O2 when PT arrived.  Replaced O2 and talked with pt about getting OOB to walk.  Agreed and did talk about her line needing extra tape to reinforce and control the pulling against her skin.  Follow Up Recommendations  Outpatient PT     Equipment Recommendations  Cane    Recommendations for Other Services       Precautions / Restrictions Precautions Precautions: None Restrictions Weight Bearing Restrictions: No    Mobility  Bed Mobility Overal bed mobility: Needs Assistance Bed Mobility: Supine to Sit;Sit to Supine     Supine to sit: Supervision Sit to supine: Min assist   General bed mobility comments: min A with r LE back onto bed, assist for scooting to [facility] and positioning once back in bed  Transfers Overall transfer level: Needs assistance Equipment used:  (IV pole) Transfers: Sit to/from Stand Sit to Stand: Supervision Stand pivot transfers: Min assist       General transfer comment: sup for saefty, no physical asssit required  Ambulation/Gait Ambulation/Gait assistance: +2 physical assistance;+2 safety/equipment;Min assist Ambulation Distance (Feet): 35 Feet Assistive device:  (IV pole) Gait Pattern/deviations: Step-to pattern;Decreased stance time - right;Decreased dorsiflexion - right;Wide base of support;Trunk flexed Gait velocity: quite slow Gait velocity interpretation: Below normal speed for age/gender General Gait Details: using IV to control use of RLE, has procedure tomorrow to possibly to I & D and biopsy   Stairs            Wheelchair Mobility    Modified Rankin (Stroke Patients Only)       Balance  Overall balance assessment: Needs assistance Sitting-balance support: Feet supported Sitting balance-Leahy Scale: Fair   Postural control: Posterior lean Standing balance support: Bilateral upper extremity supported;Single extremity supported;During functional activity Standing balance-Leahy Scale: Fair                      Cognition Arousal/Alertness: Awake/alert Behavior During Therapy: WFL for tasks assessed/performed Overall Cognitive Status: Within Functional Limits for tasks assessed                      Exercises      General Comments General comments (skin integrity, edema, etc.): painful and fearful of her line in the neck being pulled out, and talked with nursing about pain meds and extra tape for line      Pertinent Vitals/Pain Pain Assessment: 0-10 Pain Score: 6  Faces Pain Scale: Hurts whole lot Pain Location: R UE, R LE, R neck area (HD line) Pain Descriptors / Indicators: Aching;Sore;Discomfort Pain Intervention(s): Monitored during session;Repositioned    Home Living                      Prior Function            PT Goals (current goals can now be found in the care plan section) Acute Rehab PT Goals Patient Stated Goal: wants to feel better Progress towards PT goals: Progressing toward goals    Frequency  Min 3X/week    PT Plan Current plan remains appropriate    Co-evaluation  Reason for Co-Treatment: Complexity of the patient's impairments (multi-system involvement);For patient/therapist safety   OT goals addressed during session: ADL's and self-care     End of Session Equipment Utilized During Treatment: Gait belt Activity Tolerance: Patient tolerated treatment well;Patient limited by pain Patient left: with call bell/phone within reach;in bed;with family/visitor present     Time: [id number] PT Time Calculation (min) (ACUTE ONLY): 28 min  Charges:  $Gait Training: 8-22 mins $Therapeutic Activity: 8-22  mins                    G Codes:      Ramond Dial 09/06/14, 2:25 PM   Mee Hives, PT MS Acute Rehab Dept. Number: [facility] [phone] and [place] [phone]

## 2014-08-17 ENCOUNTER — Inpatient Hospital Stay (HOSPITAL_COMMUNITY): Payer: BLUE CROSS/BLUE SHIELD

## 2014-08-17 DIAGNOSIS — A4901 Methicillin susceptible Staphylococcus aureus infection, unspecified site: Secondary | ICD-10-CM

## 2014-08-17 LAB — CULTURE, ROUTINE-ABSCESS

## 2014-08-17 LAB — CBC
HCT: 20.8 % — ABNORMAL LOW (ref 36.0–46.0)
HEMOGLOBIN: 6.9 g/dL — AB (ref 12.0–15.0)
MCH: 27.6 pg (ref 26.0–34.0)
MCHC: 33.2 g/dL (ref 30.0–36.0)
MCV: 83.2 fL (ref 78.0–100.0)
Platelets: 379 10*3/uL (ref 150–400)
RBC: 2.5 MIL/uL — ABNORMAL LOW (ref 3.87–5.11)
RDW: 14.2 % (ref 11.5–15.5)
WBC: 20.8 10*3/uL — ABNORMAL HIGH (ref 4.0–10.5)

## 2014-08-17 LAB — TISSUE CULTURE

## 2014-08-17 LAB — HEPATITIS B SURFACE ANTIBODY,QUALITATIVE: HEP B S AB: REACTIVE

## 2014-08-17 LAB — TSH: TSH: 7.256 u[IU]/mL — ABNORMAL HIGH (ref 0.350–4.500)

## 2014-08-17 LAB — GLUCOSE, CAPILLARY
GLUCOSE-CAPILLARY: 149 mg/dL — AB (ref 65–99)
Glucose-Capillary: 118 mg/dL — ABNORMAL HIGH (ref 65–99)
Glucose-Capillary: 319 mg/dL — ABNORMAL HIGH (ref 65–99)

## 2014-08-17 LAB — HEPATITIS B CORE ANTIBODY, TOTAL: Hep B Core Total Ab: NEGATIVE

## 2014-08-17 LAB — HEPATITIS B SURFACE ANTIGEN: Hepatitis B Surface Ag: NEGATIVE

## 2014-08-17 MED ORDER — MIDAZOLAM HCL 2 MG/2ML IJ SOLN
INTRAMUSCULAR | Status: AC
  Filled 2014-08-17: qty 2

## 2014-08-17 MED ORDER — CEFAZOLIN SODIUM-DEXTROSE 2-3 GM-% IV SOLR
2.0000 g | INTRAVENOUS | Status: DC
  Administered 2014-08-17 – 2014-09-01 (×16): 2 g via INTRAVENOUS
  Filled 2014-08-17 (×19): qty 50

## 2014-08-17 MED ORDER — FENTANYL CITRATE (PF) 100 MCG/2ML IJ SOLN
INTRAMUSCULAR | Status: AC | PRN
  Administered 2014-08-17 (×2): 25 ug via INTRAVENOUS

## 2014-08-17 MED ORDER — LEVOTHYROXINE SODIUM 75 MCG PO TABS
75.0000 ug | ORAL_TABLET | Freq: Every day | ORAL | Status: DC
  Administered 2014-08-18 – 2014-08-19 (×2): 75 ug via ORAL
  Filled 2014-08-17 (×4): qty 1

## 2014-08-17 MED ORDER — SODIUM CHLORIDE 0.9 % IV SOLN
INTRAVENOUS | Status: AC | PRN
  Administered 2014-08-17: 10 mL/h via INTRAVENOUS

## 2014-08-17 MED ORDER — MIDAZOLAM HCL 2 MG/2ML IJ SOLN
INTRAMUSCULAR | Status: AC | PRN
  Administered 2014-08-17: 1 mg via INTRAVENOUS
  Administered 2014-08-17: 0.5 mg via INTRAVENOUS

## 2014-08-17 MED ORDER — LIDOCAINE HCL (PF) 1 % IJ SOLN
INTRAMUSCULAR | Status: AC
  Filled 2014-08-17: qty 10

## 2014-08-17 MED ORDER — FENTANYL CITRATE (PF) 100 MCG/2ML IJ SOLN
INTRAMUSCULAR | Status: AC
  Filled 2014-08-17: qty 2

## 2014-08-17 NOTE — Progress Notes (Signed)
Subjective: 3 Days Post-Op Procedure(s) (LRB): IRRIGATION AND DEBRIDEMENT EXTREMITY WITH MUSCLE BIOPSY (Right) Patient reports pain as mild.  She is in much better spirits. She is eating a [employer].  She is without new complaints.  Facet best I've seen her in terms of her spirits and outlook today.  Objective: Vital signs in last 24 hours: Temp:  [97.7 F (36.5 C)-99.8 F (37.7 C)] 98.5 F (36.9 C) (07/06 1742) Pulse Rate:  [87-99] 95 (07/06 1742) Resp:  [15-22] 18 (07/06 1742) BP: (133-190)/(49-92) 133/66 mmHg (07/06 1742) SpO2:  [91 %-100 %] 94 % (07/06 1742) Weight:  [85.6 kg (188 lb 11.4 oz)-86 kg (189 lb 9.5 oz)] 85.6 kg (188 lb 11.4 oz) (07/06 1044)  Intake/Output from previous day: 07/05 0701 - 07/06 0700 In: 2995 [P.O.:1300; I.V.:1595; IV Piggyback:100] Out: 1133 [Urine:100; Drains:33] Intake/Output this shift:     Recent Labs  08/16/14 0634 08/17/14 0530  HGB 7.0* 6.9*    Recent Labs  08/16/14 0634 08/17/14 0530  WBC 25.5* 20.8*  RBC 2.51* 2.50*  HCT 20.9* 20.8*  PLT 361 379    Recent Labs  08/16/14 0634  NA 132*  K 4.6  CL 104  CO2 15*  BUN 69*  CREATININE 8.65*  GLUCOSE 89  CALCIUM 7.6*    Recent Labs  08/16/14 0635  INR 1.40    Physical exam: Patient is taken out of her bandage I showed her the hard splint and the soft bandage I then take down the soft bandage cleansed the back of her arm which looks excellent there is no erythema cellulitis throat problems she is moving it better and has less pain. Her triceps still is firm to palpation indicative of the advance - as. There is no active drainage dose at postop day 3 I removed her drain. There is no foul smell the patient was labs see the area of course and we took pictures so that she could see it. The family was in the room. At present time she is remaining neurovascularly intact there's no worsening and hopefully the arm will continue to respond. She has much improved physical  qualities today including her overall range of motion strength and active performance  At present time she doing fairly well  Assessment/Plan: 3 Days Post-Op Procedure(s) (LRB): IRRIGATION AND DEBRIDEMENT EXTREMITY WITH MUSCLE BIOPSY (Right) We'll plan to continue close observation and encourage range of motion and other measures. If she had a dramatic worsening we would redirect our course. At present time she is making improvements.  I'm encouraged regarding how well she looks.  Cultures have been noted. Family has been informed of all issues plans and findings.  Paulene Floor 08/17/2014, 7:11 PM

## 2014-08-17 NOTE — Progress Notes (Signed)
Patient ID: Tracy [name], female   DOB: 06/20/1985, 29 y.o.   MRN: [id number]  [facility] Progress Note    Assessment/ Plan:   1. Acute renal failure on chronic kidney disease stage IV: Suspected to be hemodynamically mediated versus multifactorial ATN including NSAIDs. Ongoing HD for clearance as she started having uremic symptoms-- unclear if indeed she has progressed to ESRD. Will assess daily for HD needs/access plans 2. Myositis right arm/right thigh-status post open biopsy of right arm and placement of JP drain by surgery- cultures with Staph - now on Clindamycin in addition to Vanco (levels high on labs yesterday). Symptomatically better and to get thigh aspiration/muscle biopsy today by IR 3. Anemia: Secondary to infection/ESA resistance and some surgical losses-PRBC transfusion at HD today 4. Hyponatremia: Most likely due to acute renal failure and defective free water handling-monitor with dialysis  Subjective:   Reports to be doing better today with regards to arm/thigh pain   Objective:   BP 166/92 mmHg  Pulse 93  Temp(Src) 98.8 F (37.1 C) (Oral)  Resp 16  Ht 5\' 3"  (1.6 m)  Wt 86 kg (189 lb 9.5 oz)  BMI 33.59 kg/m2  SpO2 96%  LMP 07/18/2014  Intake/Output Summary (Last 24 hours) at 08/17/14 0936 Last data filed at 08/17/14 0930  Gross per 24 hour  Intake   3430 ml  Output    133 ml  Net   3297 ml   Weight change: 13.5 kg (29 lb 12.2 oz)  Physical Exam: [id number] on HD CVS: Pulse RRR, normal s1 and s2 Resp:CTA bilaterally, no rales/rhonchi [id number], flat, NT, BS normal Ext: Trace LE edema with dressing/drain right arm  Imaging: Ir Fluoro Guide Cv Line Right  08/15/2014   CLINICAL DATA:  Diabetes, acute renal insufficiency  EXAM: EXAM RIGHT IJ CATHETER PLACEMENT UNDER ULTRASOUND AND FLUOROSCOPIC GUIDANCE  TECHNIQUE: The procedure, risks (including but not limited to bleeding, infection, organ damage, pneumothorax), benefits, and  alternatives were explained to the patient. Questions regarding the procedure were encouraged and answered. The patient understands and consents to the procedure. Patency of the right IJ vein was confirmed with ultrasound with image documentation. An appropriate skin site was determined. Skin site was marked. Region was prepped using maximum barrier technique including cap and mask, sterile gown, sterile gloves, large sterile sheet, and Chlorhexidine as cutaneous antisepsis. The region was infiltrated locally with 1% lidocaine. Under real-time ultrasound guidance, the right IJ vein was accessed with a 19 gauge needle; the needle tip within the vein was confirmed with ultrasound image documentation. The needle exchanged over a guidewire for vascular dilator which allowed advancement of a 20 cm Trialysis catheter. This was positioned with the tip at the cavoatrial junction. Spot chest radiograph shows good positioning and no pneumothorax. Catheter was flushed and sutured externally with 0-Prolene sutures. Patient tolerated the procedure well.  FLUOROSCOPY TIME:  1.1 mGy, 6 seconds  COMPLICATIONS: COMPLICATIONS none  IMPRESSION: 1. Technically successful right IJ Trialysis catheter placement.   Electronically Signed   By: Lucrezia Europe M.D.   On: 08/15/2014 11:00   Ir US Guide Vasc Access Right  08/15/2014   CLINICAL DATA:  Diabetes, acute renal insufficiency  EXAM: EXAM RIGHT IJ CATHETER PLACEMENT UNDER ULTRASOUND AND FLUOROSCOPIC GUIDANCE  TECHNIQUE: The procedure, risks (including but not limited to bleeding, infection, organ damage, pneumothorax), benefits, and alternatives were explained to the patient. Questions regarding the procedure were encouraged and answered. The patient understands and consents to the procedure. Patency  of the right IJ vein was confirmed with ultrasound with image documentation. An appropriate skin site was determined. Skin site was marked. Region was prepped using maximum barrier technique  including cap and mask, sterile gown, sterile gloves, large sterile sheet, and Chlorhexidine as cutaneous antisepsis. The region was infiltrated locally with 1% lidocaine. Under real-time ultrasound guidance, the right IJ vein was accessed with a 19 gauge needle; the needle tip within the vein was confirmed with ultrasound image documentation. The needle exchanged over a guidewire for vascular dilator which allowed advancement of a 20 cm Trialysis catheter. This was positioned with the tip at the cavoatrial junction. Spot chest radiograph shows good positioning and no pneumothorax. Catheter was flushed and sutured externally with 0-Prolene sutures. Patient tolerated the procedure well.  FLUOROSCOPY TIME:  1.1 mGy, 6 seconds  COMPLICATIONS: COMPLICATIONS none  IMPRESSION: 1. Technically successful right IJ Trialysis catheter placement.   Electronically Signed   By: Lucrezia Europe M.D.   On: 08/15/2014 11:00    Labs: BMET  Recent Labs Lab 08/11/14 0507 08/12/14 0538 08/13/14 0342 08/14/14 0846 08/16/14 0634  NA 132* 131* 130* 132* 132*  K 4.7 4.6 4.8 4.3 4.6  CL 105 102 102 104 104  CO2 17* 15* 12* 17* 15*  GLUCOSE 176* 215* 223* 103* 89  BUN 42* 47* 54* 58* 69*  CREATININE 5.39* 5.91* 6.37* 7.33* 8.65*  CALCIUM 8.5* 8.5* 8.4* 8.7* 7.6*  PHOS  --  6.0* 6.4* 6.5*  --    CBC  Recent Labs Lab 08/11/14 0507  08/13/14 0540 08/14/14 0846 08/16/14 0634 08/17/14 0530  WBC 16.9*  < > 15.0* 18.4* 25.5* 20.8*  NEUTROABS 14.2*  --  12.6*  --   --   --   HGB 7.4*  < > 7.3* 8.2* 7.0* 6.9*  HCT 23.0*  < > 22.3* 25.1* 20.9* 20.8*  MCV 84.2  < > 82.9 83.9 83.3 83.2  PLT 250  < > 291 204 361 379  < > = values in this interval not displayed.  Medications:    . sodium chloride   Intravenous Once  . amLODipine  10 mg Oral Daily  . famotidine  40 mg Oral BID  . heparin  5,000 Units Subcutaneous 3 times per day  . insulin aspart  0-9 Units Subcutaneous TID WC  . insulin glargine  15 Units  Subcutaneous Daily  . levothyroxine  125 mcg Oral QAC breakfast  . metoprolol tartrate  50 mg Oral BID  . multivitamin with minerals  1 tablet Oral Daily  . senna-docusate  2 tablet Oral QHS  . sodium chloride  1,000 mL Intravenous Once  . sodium chloride  3 mL Intravenous Q12H   Elmarie Shiley, MD 08/17/2014, 9:36 AM

## 2014-08-17 NOTE — Progress Notes (Signed)
PT Cancellation Note  Patient Details Name: Tracy [name] MRN: [id number] DOB: 1985/03/12   Cancelled Treatment:    Reason Eval/Treat Not Completed: Fatigue/lethargy limiting ability to participate. Pt stated " i have been up and walking more. i just got pain medicine and i have a procedure at noon". Pt deferred PT till later this afternoon, will re-attempt to see pt this afternoon (as time allows or at next available time). Pt encouraged to cont OOB activities with nursing.    [place], [place] [id number] 08/17/2014, 12:19 PM

## 2014-08-17 NOTE — Progress Notes (Signed)
CRITICAL VALUE ALERT  Critical value received:  Hemoglobin 6.9  Date of notification:  08/17/2014  Time of notification:  [phone]  Critical value read back:Yes.    Nurse who received alert:  Arlyss Queen  MD notified (1st page):  Baltazar Najjar NP  Time of first page:  [phone]

## 2014-08-17 NOTE — Procedures (Signed)
Successful US ASPIRATION OF RT THIGH MUSCLE ABSCESS 3CC PURULENT FLUID ASPIRATED GS/CX SENT NO COMP STABLE

## 2014-08-17 NOTE — Progress Notes (Addendum)
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 Penicillins Hives and Swelling  . Sulfa Antibiotics Hives    Patient Measurements: Height: 5\' 3"  (160 cm) Weight: 188 lb 11.4 oz (85.6 kg) IBW/kg (Calculated) : 52.4   Vital Signs: Temp: 98.9 F (37.2 C) (07/06 1113) Temp Source: Oral (07/06 1113) BP: 155/74 mmHg (07/06 1113) Pulse Rate: 96 (07/06 1044) Intake/Output from previous day: 07/05 0701 - 07/06 0700 In: 2995 [P.O.:1300; I.V.:1595; IV Piggyback:100] Out: 1133 [Urine:100; Drains:33] Intake/Output from this shift: Total I/O In: 764 [I.V.:100; Blood:664] Out: 336 [Other:336]  Labs:  Recent Labs  08/16/14 0634 08/17/14 0530  WBC 25.5* 20.8*  HGB 7.0* 6.9*  PLT 361 379  CREATININE 8.65*  --    Estimated Creatinine Clearance: 9.9 mL/min (by C-G formula based on Cr of 8.65).  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  WBC 25.5>20.8, Afebrile.  Pt with hx of allergic rxn to PCN: per pt she had urticaria to PCN in the 9th grade, no respiratory distress.  She got cefepime 2 mg x 1 on 01/20/2014 with no reaction.  She has gotten first dose of ancef with no allergic rxn per RN report.  New HD pt - today is day #2.  ARF on CKD.  Unclear if she has progressed to ESRD. To be assessed daily for HD needs/plans.  MSSA results and allergy data d/w Dr. Megan Salon and Dr. Candiss Norse.  Plan to change abx to ancef for MSSA myositis.   7/3 debridement in OR 7/6 R to biopsy and aspirate muscle of R thigh   6/28 vanc>>7/6 7/5 VR prior to pt's first HD = 29 mcg/ml (goal typically 15-25 mcg/ml prior to HD).  6/28 aztreonam >>7/4 7/4 clinda >>7/5 7/6 ancef>>  6/28 urine cx: >100,000 lactobacillus F 6/28 blood cx: negative F 7/3: Tissue/Abscess x 2: MSSA   Plan: -Ancef 2 gm IV q24, give after HD on HD  days -f/u for HD sessions, changes  Eudelia Bunch, Pharm.D. [id number] 08/17/2014 2:09 PM

## 2014-08-17 NOTE — Progress Notes (Signed)
Pt. returned from procedure; VSS, A&O x4. Site rt thigh with band-aid CDI; +CMS.  Right hand swollen, warm to touch, pulses1+; CMS fingers WNL. Sling and compression wrap in place. JP drain activated with small amt. bloody drng. Denies pain at present. Monitor.

## 2014-08-17 NOTE — Progress Notes (Signed)
Patient Demographics:    Tanara Penafiel, is a 29 y.o. female, DOB - Jul 22, 1985, [id number]  Admit date - 08/09/2014   Admitting Physician Ivor Costa, MD  Outpatient Primary MD for the patient is [name]  LOS - 8   Chief Complaint  Patient presents with  . Arm Pain        Subjective:    Hazen Vanderhart today has, No headache, No chest pain, No abdominal pain - No Nausea, No new weakness tingling or numbness, No Cough - SOB. Continues to have right arm and right thigh pain.   Assessment  & Plan :     1. Myositis of the right arm and right thigh. Most likely post physical strain 2 weeks ago while throwing horse feed with some muscle injury with secondary infection. Case discussed with rheumatologist Dr. Amil Amen over the phone on the day of admission and then on 08/14/2014, ANA is negative, aldolase unremarkable 2, CK is mildly elevated but has normalized on follow-up. Per Dr. Amil Amen hold steroids still biopsy shows inflammation or a need for steroids use. Also seen this admission by general surgery, hand surgery and orthopedics urgent.   She is post right arm muscle biopsy, fluid drainage and JP drain placement on 08/14/2014 by hand surgery, so far cultures growing MSSA. Been on vancomycin since admission which is being continued by ID diabetics might be changed later on 08/17/2014 by ID. Source of bacteremia unclear, echocardiogram unremarkable, denies any IV drug use.  Discussed with Dr. Edmonia Lynch orthopedic he requested  IR to do a CT-guided drainage of the right thigh fluid collection Which is to be done later on 08/17/2014. He will be following  the patient closely as well.  Ultrasound of the right upper arm does not show a clot.    2. Hypothyroidism. Was not taking Synthroid  for the last couple of months, counseled on compliance, TSH was 25, Synthroid dose adjusted TSH is improving.    3. Anemia - iron deficiency on into anemia panel, IV and replaced. Transfuse 2 units of packed RBC on 08/17/2014 with dialysis. Outpatient workup for iron deficiency anemia in a menstruating female as appropriate.    4.ARF of CKD 4 - baseline creatinine of 3, had some nausea vomiting and NSAID use prior to admission, likely ATN. Avoid nephrotoxins, continue hydration, renal ultrasound shows cyst but no obstruction, urine output stable, Renal following. Started on dialysis on 08/16/2014 via right IJ dialysis catheter placed by IR 08/15/2014 could require permanent dialysis.    5. DM1 - poor outpatient control with A1c of 8.9 , reduced Lantus and stopped QHS ISS as am CBG low,  monitor CBGs.  CBG (last 3)   Recent Labs  08/16/14 1205 08/16/14 1707 08/16/14 2028  GLUCAP 94 72 78    Lab Results  Component Value Date   HGBA1C 8.9* 08/10/2014     6. UA appears clean with 0-2 WBCs ruling out UTI, cultures growing lactobacilli which likely is contamination from vaginal flora. Monitor clinically.     Code Status : Full  Family Communication  : Mother bedside, boyfriend bedside  Disposition Plan  : Home   Consults  :  Orthopedics, hand surgery, general surgery, Renal, rheumatologist Dr. Amil Amen over the phone  2, ID  Procedures  :   R Arm - Muscle biopsy, fluid drainage and JP drain placement 08-14-14 by Dr Amedeo Plenty.  Right upper extremity venous duplex. No DVT, MRI right arm and right leg x 2, CT right thigh  Right IJ dialysis catheter placed by IR on 08/15/2014  Started on dialysis 08/16/2014  R Leg US guided Aspiration by IR on 08-17-14   TTE  - Left ventricle: The cavity size was normal. Wall thickness wasincreased in a pattern of mild LVH. Systolic function wasvigorous. The estimated ejection fraction was in the range of 65%to 70%. Wall motion was normal;  there were no regional wallmotion abnormalities. Left ventricular diastolic functionparameters were normal.  - Mitral valve: There was mild regurgitation.  - Pericardium, extracardiac: A trivial pericardial effusion wasidentified. Features were not consistent with tamponadephysiology.   DVT Prophylaxis  :   Heparin    Lab Results  Component Value Date   PLT 379 08/17/2014    Inpatient Medications  Scheduled Meds: . sodium chloride   Intravenous Once  . amLODipine  10 mg Oral Daily  .  ceFAZolin (ANCEF) IV  2 g Intravenous Q24H  . heparin  5,000 Units Subcutaneous 3 times per day  . insulin aspart  0-9 Units Subcutaneous TID WC  . insulin glargine  15 Units Subcutaneous Daily  . levothyroxine  125 mcg Oral QAC breakfast  . metoprolol tartrate  50 mg Oral BID  . multivitamin with minerals  1 tablet Oral Daily  . senna-docusate  2 tablet Oral QHS  . sodium chloride  1,000 mL Intravenous Once  . sodium chloride  3 mL Intravenous Q12H   Continuous Infusions:   PRN Meds:.acetaminophen **OR** acetaminophen, alum & mag hydroxide-simeth, diphenhydrAMINE, hydrALAZINE, HYDROcodone-acetaminophen, HYDROmorphone (DILAUDID) injection, ondansetron, polyethylene glycol, zolpidem  Antibiotics  :     Anti-infectives    Start     Dose/Rate Route Frequency Ordered Stop   08/17/14 1200  ceFAZolin (ANCEF) IVPB 2 g/50 mL premix     2 g 100 mL/hr over 30 Minutes Intravenous Every 24 hours 08/17/14 1003     08/15/14 1600  clindamycin (CLEOCIN) IVPB 900 mg  Status:  Discontinued     900 mg 100 mL/hr over 30 Minutes Intravenous Every 8 hours 08/15/14 1529 08/16/14 1559   08/15/14 1530  clindamycin (CLEOCIN) IVPB 300 mg  Status:  Discontinued     300 mg 100 mL/hr over 30 Minutes Intravenous 3 times per day 08/15/14 1522 08/15/14 1531   08/15/14 1430  aztreonam (AZACTAM) 500 mg in dextrose 5 % 50 mL IVPB  Status:  Discontinued     500 mg 100 mL/hr over 30 Minutes Intravenous Every 8 hours  08/15/14 1118 08/15/14 1522   08/11/14 2200  vancomycin (VANCOCIN) IVPB 1000 mg/200 mL premix  Status:  Discontinued     1,000 mg 200 mL/hr over 60 Minutes Intravenous Every 48 hours 08/09/14 2256 08/16/14 0716   08/10/14 1045  vancomycin (VANCOCIN) IVPB 750 mg/150 ml premix  Status:  Discontinued     750 mg 150 mL/hr over 60 Minutes Intravenous Every 12 hours 08/09/14 2252 08/09/14 2256   08/09/14 2230  aztreonam (AZACTAM) 1 g in dextrose 5 % 50 mL IVPB  Status:  Discontinued     1 g 100 mL/hr over 30 Minutes Intravenous Every 8 hours 08/09/14 2215 08/15/14 1118   08/09/14 2200  vancomycin (VANCOCIN) IVPB 1000 mg/200 mL premix  Status:  Discontinued     1,000 mg 200 mL/hr  over 60 Minutes Intravenous Every 48 hours 08/09/14 2159 08/09/14 2252   08/09/14 2145  vancomycin (VANCOCIN) IVPB 1000 mg/200 mL premix  Status:  Discontinued     1,000 mg 200 mL/hr over 60 Minutes Intravenous  Once 08/09/14 2137 08/09/14 2147        Objective:   Filed Vitals:   08/17/14 1015 08/17/14 1030 08/17/14 1044 08/17/14 1113  BP: 176/87 164/82 164/84 155/74  Pulse: 96 96 96   Temp: 98.7 F (37.1 C)  98.7 F (37.1 C) 98.9 F (37.2 C)  TempSrc: Oral  Oral Oral  Resp: 19 18 18 18   Height:      Weight:   85.6 kg (188 lb 11.4 oz)   SpO2:   96% 100%    Wt Readings from Last 3 Encounters:  08/17/14 85.6 kg (188 lb 11.4 oz)  08/08/14 68.493 kg (151 lb)  08/05/14 68.493 kg (151 lb)     Intake/Output Summary (Last 24 hours) at 08/17/14 1121 Last data filed at 08/17/14 1044  Gross per 24 hour  Intake   3429 ml  Output    444 ml  Net   2985 ml     Physical Exam  Awake Alert, Oriented X 3, No new F.N deficits, Normal affect [place].AT,PERRAL Supple Neck,No JVD, No cervical lymphadenopathy appriciated.  Symmetrical Chest wall movement, Good air movement bilaterally, CTAB RRR,No Gallops,Rubs or new Murmurs, No Parasternal Heave +ve B.Sounds, Abd Soft, No tenderness, No organomegaly appriciated, No  rebound - guarding or rigidity. No Cyanosis, Clubbing or edema, No new Rash or bruise  Swelling in the right arm and right thigh, some tenderness, no warmth, no signs of compartment syndrome    Data Review:   Micro Results Recent Results (from the past 240 hour(s))  Urine culture     Status: None   Collection Time: 08/09/14  1:57 PM  Result Value Ref Range Status   Specimen Description URINE, CLEAN CATCH  Final   Special Requests NONE  Final   Culture >=100,000 COLONIES/mL LACTOBACILLUS SPECIES  Final   Report Status 08/11/2014 FINAL  Final  Culture, blood (x 2)     Status: None   Collection Time: 08/09/14 11:42 PM  Result Value Ref Range Status   Specimen Description BLOOD LEFT HAND  Final   Special Requests BOTTLES DRAWN AEROBIC ONLY [place]  Final   Culture NO GROWTH 5 DAYS  Final   Report Status 08/15/2014 FINAL  Final  Culture, blood (x 2)     Status: None   Collection Time: 08/09/14 11:50 PM  Result Value Ref Range Status   Specimen Description BLOOD RIGHT HAND  Final   Special Requests   Final    BOTTLES DRAWN AEROBIC AND ANAEROBIC [place] BLUE 5CC PURPLE   Culture NO GROWTH 5 DAYS  Final   Report Status 08/15/2014 FINAL  Final  Anaerobic culture     Status: None (Preliminary result)   Collection Time: 08/14/14  2:32 PM  Result Value Ref Range Status   Specimen Description FLUID  Final   Special Requests RIGHT ARM FLUID A  Final   Gram Stain   Final    MODERATE WBC PRESENT,BOTH PMN AND MONONUCLEAR NO SQUAMOUS EPITHELIAL CELLS SEEN RARE GRAM POSITIVE COCCI IN PAIRS IN CLUSTERS Performed at [facility]    Culture   Final    NO ANAEROBES ISOLATED; CULTURE IN PROGRESS FOR 5 DAYS Performed at [facility]    Report Status PENDING  Incomplete  Culture,  routine-abscess     Status: None   Collection Time: 08/14/14  2:32 PM  Result Value Ref Range Status   Specimen Description ARM RIGHT  Final   Special Requests NONE  Final   Gram Stain   Final    FEW WBC  PRESENT, PREDOMINANTLY PMN NO SQUAMOUS EPITHELIAL CELLS SEEN RARE GRAM POSITIVE COCCI IN CLUSTERS Performed at [facility]    Culture   Final    FEW STAPHYLOCOCCUS AUREUS Note: RIFAMPIN AND GENTAMICIN SHOULD NOT BE USED AS SINGLE DRUGS FOR TREATMENT OF STAPH INFECTIONS. Performed at [facility]    Report Status 08/17/2014 FINAL  Final   Organism ID, Bacteria STAPHYLOCOCCUS AUREUS  Final      Susceptibility   Staphylococcus aureus - MIC*    CLINDAMYCIN <=0.25 SENSITIVE Sensitive     ERYTHROMYCIN <=0.25 SENSITIVE Sensitive     GENTAMICIN <=0.5 SENSITIVE Sensitive     LEVOFLOXACIN 0.25 SENSITIVE Sensitive     OXACILLIN <=0.25 SENSITIVE Sensitive     PENICILLIN 0.06 SENSITIVE Sensitive     RIFAMPIN <=0.5 SENSITIVE Sensitive     TRIMETH/SULFA <=10 SENSITIVE Sensitive     VANCOMYCIN 1 SENSITIVE Sensitive     TETRACYCLINE <=1 SENSITIVE Sensitive     MOXIFLOXACIN <=0.25 SENSITIVE Sensitive     * FEW STAPHYLOCOCCUS AUREUS  Anaerobic culture     Status: None (Preliminary result)   Collection Time: 08/14/14  2:36 PM  Result Value Ref Range Status   Specimen Description TISSUE  Final   Special Requests RIGHT ARM B  Final   Gram Stain   Final    NO WBC SEEN NO SQUAMOUS EPITHELIAL CELLS SEEN RARE GRAM POSITIVE COCCI IN PAIRS IN CLUSTERS Performed at [facility]    Culture   Final    NO ANAEROBES ISOLATED; CULTURE IN PROGRESS FOR 5 DAYS Performed at [facility]    Report Status PENDING  Incomplete  Tissue culture     Status: None   Collection Time: 08/14/14  2:36 PM  Result Value Ref Range Status   Specimen Description TISSUE  Final   Special Requests RIGHT ARM B  Final   Gram Stain   Final    RARE WBC PRESENT, PREDOMINANTLY PMN NO SQUAMOUS EPITHELIAL CELLS SEEN RARE GRAM POSITIVE COCCI IN PAIRS Performed at [facility]    Culture   Final    MODERATE STAPHYLOCOCCUS AUREUS Note: RIFAMPIN AND GENTAMICIN SHOULD NOT BE USED AS SINGLE  DRUGS FOR TREATMENT OF STAPH INFECTIONS. Performed at [facility]    Report Status 08/17/2014 FINAL  Final   Organism ID, Bacteria STAPHYLOCOCCUS AUREUS  Final      Susceptibility   Staphylococcus aureus - MIC*    CLINDAMYCIN <=0.25 SENSITIVE Sensitive     ERYTHROMYCIN <=0.25 SENSITIVE Sensitive     GENTAMICIN <=0.5 SENSITIVE Sensitive     LEVOFLOXACIN 0.25 SENSITIVE Sensitive     OXACILLIN <=0.25 SENSITIVE Sensitive     PENICILLIN 0.06 SENSITIVE Sensitive     RIFAMPIN <=0.5 SENSITIVE Sensitive     TRIMETH/SULFA <=10 SENSITIVE Sensitive     VANCOMYCIN 1 SENSITIVE Sensitive     TETRACYCLINE <=1 SENSITIVE Sensitive     MOXIFLOXACIN <=0.25 SENSITIVE Sensitive     * MODERATE STAPHYLOCOCCUS AUREUS    Radiology Reports Dg Elbow Complete Right  08/05/2014   CLINICAL DATA:  Right elbow pain that radiates to the shoulder. Pain started after picking up horse feed.  EXAM: RIGHT ELBOW - COMPLETE 3+ VIEW  COMPARISON:  None.  FINDINGS: There is no evidence of fracture, dislocation, or joint effusion. There is no evidence of arthropathy or other focal bone abnormality. Soft tissues are unremarkable.  IMPRESSION: Negative.   Electronically Signed   By: Markus Daft M.D.   On: 08/05/2014 08:05   Mr Humerus Right Wo Contrast  08/12/2014   CLINICAL DATA:  Myositis in the right arm.  Injury 2 weeks ago.  EXAM: MRI OF THE RIGHT HUMERUS WITHOUT CONTRAST  TECHNIQUE: Multiplanar, multisequence MR imaging was performed. No intravenous contrast was administered.  COMPARISON:  08/10/2014  FINDINGS: Dependent atelectasis in the right lower lobe.  There is considerable abnormal edema in the lateral, long, and medial head of the triceps diffusely. The internal tendon of the medial head seems torn on images 20 through 23 of series 7, with an associated 2.2 by 0.6 by 1.4 cm fluid collection along the proximal margin of the tendon.  There is edema in the coracobrachialis muscle tendon a small medial portion of the  brachialis muscle.  Left axillary lymph nodes measure up to 1.2 cm in short axis. Edema tracks along the axillary neurovascular structures.  Fluid signal tracks superficial to the posterior deltoid muscle and superficial to the latissimus dorsi and infraspinatus and teres minor muscles.  Subcutaneous edema tracks in the upper arm circumferentially, somewhat more confluent anteriorly.  Small elbow joint effusion.  No significant abnormal osseous edema.  IMPRESSION: 1. Again observed is extensive myositis primarily involving the triceps and coracobrachialis muscles, with a small intrasubstance fluid collection along the proximal tendinous margin of the medial head of the triceps which may represent musculotendinous tear or less likely a very small abscess. This collection is similar to prior. Possibilities include posttraumatic myositis, infection, or a vascular etiology. Mild reactive axillary adenopathy on the right may suggest an infectious component. 2. Subcutaneous edema in the upper arm, mildly increased. Cellulitis not excluded. 3. Edema tracks along the superficial margin of the deltoid muscle, teres minor, and infraspinatus as well as the latissimus dorsi. This may be incidental but shear injury cannot be excluded.   Electronically Signed   By: Van Clines M.D.   On: 08/12/2014 08:46   Mr Humerus Right Wo Contrast  08/10/2014   CLINICAL DATA:  Right arm pain after an injury throwing a bag of horse feed 2 weeks ago.  EXAM: MRI OF THE RIGHT HUMERUS WITHOUT CONTRAST  TECHNIQUE: Multiplanar, multisequence MR imaging was performed. No intravenous contrast was administered.  COMPARISON:  MRI dated 08/08/2014 and radiograph dated 05/05/2014  FINDINGS: The edema in the right triceps muscle is even more extensive than on the prior study. The muscle is larger than on the prior study. There is only slight sparing of the proximal aspect of the muscle. Subcutaneous edema is more prominent. The underlying humerus  and the neurovascular bundle are normal. The focal area of abnormal signal at the distal mild tendinous junction is unchanged.  IMPRESSION: Progressive edema of the triceps muscle consistent with myositis. Focal area of abnormal signal at the mild tendinous junction could represent a tear or small abscess but this is not progressed. Given the patient's elevated white blood count, infectious myositis must be considered.   Electronically Signed   By: Lorriane Shire M.D.   On: 08/10/2014 08:50   Mr Humerus Right Wo Contrast  08/09/2014   CLINICAL DATA:  Pain throughout entire right humerus for 2 weeks.  EXAM: MRI OF THE RIGHT HUMERUS WITHOUT CONTRAST  TECHNIQUE: Multiplanar, multisequence  MR imaging was performed. No intravenous contrast was administered.  COMPARISON:  None.  FINDINGS: No marrow signal abnormality. No fracture or dislocation. There is no bone destruction or periosteal reaction.  There is severe edema within the right triceps muscle with small areas of sparing proximally. At the distal musculotendinous junction of the medial head of the triceps, there is a complex 2 x 2 x 2.3 cm T2 signal abnormality with central T2 hyperintensity and peripheral intermediate signal. There is mild edema in the adjacent subcutaneous fat.  The biceps musculature is normal. There is no other fluid collection or soft tissue mass. The neurovascular bundles are grossly normal. There are multiple prominent right axillary lymph nodes.  IMPRESSION: 1. Severe edema within the right triceps muscle with small areas of sparing proximally. At the distal musculotendinous junction of the medial head of the triceps, there is a complex 2 x 2 x 2.3 cm T2 signal abnormality. The overall appearance is concerning for myositis which may be secondary to an infectious or inflammatory etiology. The 2 cm area of focal abnormal signal which may reflect a hematoma secondary to a partial tear given the location is at the musculotendinous junction  of the medial head of the triceps, but given the extensiveness of the muscle edema, this may reflect a developing abscess versus myonecrosis.   Electronically Signed   By: Kathreen Devoid   On: 08/09/2014 08:31   US Renal  08/10/2014   CLINICAL DATA:  Acute kidney injury.  Elevated creatinine level.  EXAM: RENAL / URINARY TRACT ULTRASOUND COMPLETE  COMPARISON:  CT examination 03/09/2013 and ultrasound 10/04/2008  FINDINGS: Right Kidney:  Length: 10.6 cm. The right kidney parenchyma is echogenic without hydronephrosis. There is a heterogeneous hypoechoic structure along the upper pole measuring up to 1.1 cm. Previously, the patient had an abscess in the right kidney and unclear if this sequela from the abscess or a new small complex lesion.  Left Kidney:  Length: 10.6 cm. Left kidney is not well visualized but appears to be echogenic. Negative for hydronephrosis.  Bladder:  Appears normal for degree of bladder distention.  IMPRESSION: Negative for hydronephrosis.  Increased echogenicity of both kidneys. Findings raise concern for chronic medical renal disease.  There is a 1.1 cm heterogeneous hypoechoic structure in the right kidney upper pole. Unclear if this is the sequelae of previous renal abscess versus new small complex cystic lesion. Evaluation of this area will be difficult due to acute kidney injury and an elevated creatinine level. However, this may be better characterized with a non contrast MRI versus close surveillance with ultrasound.   Electronically Signed   By: Markus Daft M.D.   On: 08/10/2014 11:34   Ct Femur Right Wo Contrast  08/10/2014   CLINICAL DATA:  Myositis. Sudden onset of pain while lifting a feed bag 2 weeks ago.  EXAM: CT OF THE LOWER RIGHT EXTREMITY WITHOUT CONTRAST  TECHNIQUE: Multidetector CT imaging of the right femur and right tibia fibula was performed according to the standard protocol.  COMPARISON:  None.  FINDINGS: There is no bony abnormality. There is no radiopaque foreign  body. There is no soft tissue mass. There is no abscess or drainable fluid collection. There is no soft tissue gas.  There is soft tissue edema around the muscles of the right lower extremity, particularly around the adductor of the via and the quadriceps musculature. This is consistent with the clinically described suspicion of myositis. There is a small knee joint effusion.  IMPRESSION: Soft tissue edema surrounding the musculature of the right lower extremity consistent with myositis. No mass or drainable fluid collection.   Electronically Signed   By: Andreas Newport M.D.   On: 08/10/2014 03:49   Ct Tibia Fibula Right Wo Contrast  08/10/2014   CLINICAL DATA:  Myositis. Sudden onset of pain while lifting a feed bag 2 weeks ago.  EXAM: CT OF THE LOWER RIGHT EXTREMITY WITHOUT CONTRAST  TECHNIQUE: Multidetector CT imaging of the right femur and right tibia fibula was performed according to the standard protocol.  COMPARISON:  None.  FINDINGS: There is no bony abnormality. There is no radiopaque foreign body. There is no soft tissue mass. There is no abscess or drainable fluid collection. There is no soft tissue gas.  There is soft tissue edema around the muscles of the right lower extremity, particularly around the adductor of the via and the quadriceps musculature. This is consistent with the clinically described suspicion of myositis. There is a small knee joint effusion.  IMPRESSION: Soft tissue edema surrounding the musculature of the right lower extremity consistent with myositis. No mass or drainable fluid collection.   Electronically Signed   By: Andreas Newport M.D.   On: 08/10/2014 03:49   Mr Femur Right Wo Contrast  08/12/2014   CLINICAL DATA:  Myositis of the right arm and right thigh. Status post physical strain 2 weeks ago.  EXAM: MRI OF THE RIGHT FEMUR WITHOUT CONTRAST  TECHNIQUE: Multiplanar, multisequence MR imaging of the right femur was performed. No intravenous contrast was administered.   COMPARISON:  CT right femur 08/10/2014  FINDINGS: There is severe muscle edema within the vastus medialis and intermedius muscle. There is a 14 mm more focal area of fluid signal within the vastus medialis which may reflect a more focal edema versus a small fluid collection. There is mild muscle edema within the vastus lateralis and rectus femoris. There is severe muscle edema within the sartorius muscle. There is a small amount of fluid along the fascia of the flexor compartment. There is small amount of fluid along the fascia of the flexor compartment and around the adductor compartment. There is no drainable focal fluid collection. There is no hematoma.  There is no focal marrow signal abnormality. There is no fracture or subluxation. No periosteal reaction.  IMPRESSION: 1. Severe muscle edema within the vastus medialis, vastus intermedius and sartorius muscles most consistent with myositis secondary to an infectious or inflammatory etiology. 14 mm more focal area of fluid signal within the vastus medialis which may reflect a more focal edema versus a small fluid collection. Low level edema within the vastus lateralis and rectus femoris likely reflecting myositis to a much lesser degree.   Electronically Signed   By: Kathreen Devoid   On: 08/12/2014 08:57   Ir Fluoro Guide Cv Line Right  08/15/2014   CLINICAL DATA:  Diabetes, acute renal insufficiency  EXAM: EXAM RIGHT IJ CATHETER PLACEMENT UNDER ULTRASOUND AND FLUOROSCOPIC GUIDANCE  TECHNIQUE: The procedure, risks (including but not limited to bleeding, infection, organ damage, pneumothorax), benefits, and alternatives were explained to the patient. Questions regarding the procedure were encouraged and answered. The patient understands and consents to the procedure. Patency of the right IJ vein was confirmed with ultrasound with image documentation. An appropriate skin site was determined. Skin site was marked. Region was prepped using maximum barrier technique  including cap and mask, sterile gown, sterile gloves, large sterile sheet, and Chlorhexidine as cutaneous antisepsis. The region was  infiltrated locally with 1% lidocaine. Under real-time ultrasound guidance, the right IJ vein was accessed with a 19 gauge needle; the needle tip within the vein was confirmed with ultrasound image documentation. The needle exchanged over a guidewire for vascular dilator which allowed advancement of a 20 cm Trialysis catheter. This was positioned with the tip at the cavoatrial junction. Spot chest radiograph shows good positioning and no pneumothorax. Catheter was flushed and sutured externally with 0-Prolene sutures. Patient tolerated the procedure well.  FLUOROSCOPY TIME:  1.1 mGy, 6 seconds  COMPLICATIONS: COMPLICATIONS none  IMPRESSION: 1. Technically successful right IJ Trialysis catheter placement.   Electronically Signed   By: Lucrezia Europe M.D.   On: 08/15/2014 11:00   Ir US Guide Vasc Access Right  08/15/2014   CLINICAL DATA:  Diabetes, acute renal insufficiency  EXAM: EXAM RIGHT IJ CATHETER PLACEMENT UNDER ULTRASOUND AND FLUOROSCOPIC GUIDANCE  TECHNIQUE: The procedure, risks (including but not limited to bleeding, infection, organ damage, pneumothorax), benefits, and alternatives were explained to the patient. Questions regarding the procedure were encouraged and answered. The patient understands and consents to the procedure. Patency of the right IJ vein was confirmed with ultrasound with image documentation. An appropriate skin site was determined. Skin site was marked. Region was prepped using maximum barrier technique including cap and mask, sterile gown, sterile gloves, large sterile sheet, and Chlorhexidine as cutaneous antisepsis. The region was infiltrated locally with 1% lidocaine. Under real-time ultrasound guidance, the right IJ vein was accessed with a 19 gauge needle; the needle tip within the vein was confirmed with ultrasound image documentation. The needle  exchanged over a guidewire for vascular dilator which allowed advancement of a 20 cm Trialysis catheter. This was positioned with the tip at the cavoatrial junction. Spot chest radiograph shows good positioning and no pneumothorax. Catheter was flushed and sutured externally with 0-Prolene sutures. Patient tolerated the procedure well.  FLUOROSCOPY TIME:  1.1 mGy, 6 seconds  COMPLICATIONS: COMPLICATIONS none  IMPRESSION: 1. Technically successful right IJ Trialysis catheter placement.   Electronically Signed   By: Lucrezia Europe M.D.   On: 08/15/2014 11:00   Dg Femur, Min 2 Views Right  08/10/2014   CLINICAL DATA:  Right thigh pain. Concern for myositis. Rule out soft tissue air.  EXAM: RIGHT FEMUR 2 VIEWS  COMPARISON:  None.  FINDINGS: Cortical margins of the right femur are intact. No fracture, focal osseous lesion, erosion or periosteal reaction. No soft tissue air or radiopaque foreign body. Questionable soft tissue edema proximally.  IMPRESSION: Suspect mild soft tissue edema proximally. No soft tissue air, radiopaque foreign body, or acute osseous abnormality.   Electronically Signed   By: Jeb Levering M.D.   On: 08/10/2014 01:48     CBC  Recent Labs Lab 08/11/14 0507 08/12/14 0538 08/13/14 0540 08/14/14 0846 08/16/14 0634 08/17/14 0530  WBC 16.9* 15.0* 15.0* 18.4* 25.5* 20.8*  HGB 7.4* 7.3* 7.3* 8.2* 7.0* 6.9*  HCT 23.0* 22.8* 22.3* 25.1* 20.9* 20.8*  PLT 250 276 291 204 361 379  MCV 84.2 83.8 82.9 83.9 83.3 83.2  MCH 27.1 26.8 27.1 27.4 27.9 27.6  MCHC 32.2 32.0 32.7 32.7 33.5 33.2  RDW 14.0 13.9 14.0 14.5 14.4 14.2  LYMPHSABS 1.0  --  0.9  --   --   --   MONOABS 1.5*  --  1.4*  --   --   --   EOSABS 0.1  --  0.1  --   --   --   BASOSABS  0.1  --  0.1  --   --   --     Chemistries   Recent Labs Lab 08/11/14 0507 08/12/14 0538 08/13/14 0342 08/14/14 0846 08/16/14 0634  NA 132* 131* 130* 132* 132*  K 4.7 4.6 4.8 4.3 4.6  CL 105 102 102 104 104  CO2 17* 15* 12* 17* 15*   GLUCOSE 176* 215* 223* 103* 89  BUN 42* 47* 54* 58* 69*  CREATININE 5.39* 5.91* 6.37* 7.33* 8.65*  CALCIUM 8.5* 8.5* 8.4* 8.7* 7.6*   ------------------------------------------------------------------------------------------------------------------ estimated creatinine clearance is 9.9 mL/min (by C-G formula based on Cr of 8.65). ------------------------------------------------------------------------------------------------------------------ No results for input(s): HGBA1C in the last 72 hours. ------------------------------------------------------------------------------------------------------------------ No results for input(s): CHOL, HDL, LDLCALC, TRIG, CHOLHDL, LDLDIRECT in the last 72 hours. ------------------------------------------------------------------------------------------------------------------  Recent Labs  08/17/14 0530  TSH 7.256*   ------------------------------------------------------------------------------------------------------------------ No results for input(s): VITAMINB12, FOLATE, FERRITIN, TIBC, IRON, RETICCTPCT in the last 72 hours.  Coagulation profile  Recent Labs Lab 08/16/14 0635  INR 1.40    No results for input(s): DDIMER in the last 72 hours.  Cardiac Enzymes No results for input(s): CKMB, TROPONINI, MYOGLOBIN in the last 168 hours.  Invalid input(s): CK ------------------------------------------------------------------------------------------------------------------ Invalid input(s): POCBNP   Time Spent in minutes  35   Starlene Consuegra K M.D on 08/17/2014 at 11:21 AM  Between 7am to 7pm - Pager - [phone]  After 7pm go to [email or web address] - password [facility]  [name]   Office  [phone]

## 2014-08-17 NOTE — Procedures (Signed)
Patient seen on Hemodialysis. QB 300, UF goal 1L Treatment adjusted as needed.  Elmarie Shiley MD [facility]. Office # [phone] Pager # [phone] 9:43 AM

## 2014-08-17 NOTE — Progress Notes (Signed)
Patient ID: Tracy [name], female   DOB: August 16, 1985, 29 y.o.   MRN: [id number]         [facility] Infectious Disease    Date of Admission:  08/09/2014   Total days of antibiotics 8        Day 1 Cefazolin  Principal Problem:   Myositis Active Problems:   Proliferative diabetic retinopathy   Hypothyroidism   Normocytic anemia   Acute renal failure superimposed on stage 4 chronic kidney disease   Seizure   Sepsis   DM I (diabetes mellitus, type I), uncontrolled   Essential hypertension   . sodium chloride   Intravenous Once  . amLODipine  10 mg Oral Daily  .  ceFAZolin (ANCEF) IV  2 g Intravenous Q24H  . heparin  5,000 Units Subcutaneous 3 times per day  . insulin aspart  0-9 Units Subcutaneous TID WC  . insulin glargine  15 Units Subcutaneous Daily  . [START ON 08/18/2014] levothyroxine  75 mcg Oral QAC breakfast  . metoprolol tartrate  50 mg Oral BID  . multivitamin with minerals  1 tablet Oral Daily  . senna-docusate  2 tablet Oral QHS  . sodium chloride  1,000 mL Intravenous Once  . sodium chloride  3 mL Intravenous Q12H    Subjective: Tracy [name] is a 29 year-old lady admitted 6/28 for myositis of the right upper extremity; the wound was debrided and surgical cultures have grown methicillin-sensitive Staph aureus. An MRI also showed possible fluid collection in the right medial thigh which will be drained by IR today. Today she states the pain in her arm and right thigh is unchanged over the past few days. There was question whether she had a true allergy to penicillins; today she states she got hives when she received a penicillin in middle school but she was not intubated and does not recall her throat swelling up or receiving a shot of epinephrine. She recently received cefepime in December 2012 and denies any adverse reactions at that time.  Review of Systems: Pertinent items are noted in HPI.  Past Medical History  Diagnosis Date  . Thyroid enlargement      "not on medication at this time"  . Urinary tract infection     hx of  . Anemia     presently on iron supplement  . Diabetes mellitus     insulin pump   . Hypothyroidism   . Anxiety   . HSV-2 (herpes simplex virus 2) infection   . HSV-1 (herpes simplex virus 1) infection   . Detached retina   . Yeast infection     took diflucan saturday   . Hypertension   . Irregular periods 07/05/2014  . Vaginal discharge 07/05/2014    History  Substance Use Topics  . Smoking status: Never Smoker   . Smokeless tobacco: Never Used  . Alcohol Use: No    Family History  Problem Relation Age of Onset  . Cancer Paternal Grandfather     prostate  . Hyperlipidemia Paternal Grandfather   . Stroke Paternal Grandfather    Allergies  Allergen Reactions  . Penicillins Hives and Swelling  . Sulfa Antibiotics Hives    OBJECTIVE: Blood pressure 155/74, pulse 96, temperature 98.9 F (37.2 C), temperature source Oral, resp. rate 18, height 5\' 3"  (1.6 m), weight 85.6 kg (188 lb 11.4 oz), last menstrual period 07/18/2014, SpO2 100 %. General: Lying in dialysis bed sleeping. Skin: Dry, warm. MSK: Right upper extremity bandaged. Right  thigh aspirated today with return of 3 mL of pus  Lab Results Lab Results  Component Value Date   WBC 20.8* 08/17/2014   HGB 6.9* 08/17/2014   HCT 20.8* 08/17/2014   MCV 83.2 08/17/2014   PLT 379 08/17/2014    Lab Results  Component Value Date   CREATININE 8.65* 08/16/2014   BUN 69* 08/16/2014   NA 132* 08/16/2014   K 4.6 08/16/2014   CL 104 08/16/2014   CO2 15* 08/16/2014    Lab Results  Component Value Date   ALT 18 08/10/2014   AST 38 08/10/2014   ALKPHOS 166* 08/10/2014   BILITOT 0.6 08/10/2014     Microbiology: Recent Results (from the past 240 hour(s))  Urine culture     Status: None   Collection Time: 08/09/14  1:57 PM  Result Value Ref Range Status   Specimen Description URINE, CLEAN CATCH  Final   Special Requests NONE  Final    Culture >=100,000 COLONIES/mL LACTOBACILLUS SPECIES  Final   Report Status 08/11/2014 FINAL  Final  Culture, blood (x 2)     Status: None   Collection Time: 08/09/14 11:42 PM  Result Value Ref Range Status   Specimen Description BLOOD LEFT HAND  Final   Special Requests BOTTLES DRAWN AEROBIC ONLY [place]  Final   Culture NO GROWTH 5 DAYS  Final   Report Status 08/15/2014 FINAL  Final  Culture, blood (x 2)     Status: None   Collection Time: 08/09/14 11:50 PM  Result Value Ref Range Status   Specimen Description BLOOD RIGHT HAND  Final   Special Requests   Final    BOTTLES DRAWN AEROBIC AND ANAEROBIC [place] BLUE 5CC PURPLE   Culture NO GROWTH 5 DAYS  Final   Report Status 08/15/2014 FINAL  Final  Anaerobic culture     Status: None (Preliminary result)   Collection Time: 08/14/14  2:32 PM  Result Value Ref Range Status   Specimen Description FLUID  Final   Special Requests RIGHT ARM FLUID A  Final   Gram Stain   Final    MODERATE WBC PRESENT,BOTH PMN AND MONONUCLEAR NO SQUAMOUS EPITHELIAL CELLS SEEN RARE GRAM POSITIVE COCCI IN PAIRS IN CLUSTERS Performed at [facility]    Culture   Final    NO ANAEROBES ISOLATED; CULTURE IN PROGRESS FOR 5 DAYS Performed at [facility]    Report Status PENDING  Incomplete  Culture, routine-abscess     Status: None   Collection Time: 08/14/14  2:32 PM  Result Value Ref Range Status   Specimen Description ARM RIGHT  Final   Special Requests NONE  Final   Gram Stain   Final    FEW WBC PRESENT, PREDOMINANTLY PMN NO SQUAMOUS EPITHELIAL CELLS SEEN RARE GRAM POSITIVE COCCI IN CLUSTERS Performed at [facility]    Culture   Final    FEW STAPHYLOCOCCUS AUREUS Note: RIFAMPIN AND GENTAMICIN SHOULD NOT BE USED AS SINGLE DRUGS FOR TREATMENT OF STAPH INFECTIONS. Performed at [facility]    Report Status 08/17/2014 FINAL  Final   Organism ID, Bacteria STAPHYLOCOCCUS AUREUS  Final      Susceptibility   Staphylococcus  aureus - MIC*    CLINDAMYCIN <=0.25 SENSITIVE Sensitive     ERYTHROMYCIN <=0.25 SENSITIVE Sensitive     GENTAMICIN <=0.5 SENSITIVE Sensitive     LEVOFLOXACIN 0.25 SENSITIVE Sensitive     OXACILLIN <=0.25 SENSITIVE Sensitive     PENICILLIN 0.06 SENSITIVE Sensitive  RIFAMPIN <=0.5 SENSITIVE Sensitive     TRIMETH/SULFA <=10 SENSITIVE Sensitive     VANCOMYCIN 1 SENSITIVE Sensitive     TETRACYCLINE <=1 SENSITIVE Sensitive     MOXIFLOXACIN <=0.25 SENSITIVE Sensitive     * FEW STAPHYLOCOCCUS AUREUS  Anaerobic culture     Status: None (Preliminary result)   Collection Time: 08/14/14  2:36 PM  Result Value Ref Range Status   Specimen Description TISSUE  Final   Special Requests RIGHT ARM B  Final   Gram Stain   Final    NO WBC SEEN NO SQUAMOUS EPITHELIAL CELLS SEEN RARE GRAM POSITIVE COCCI IN PAIRS IN CLUSTERS Performed at [facility]    Culture   Final    NO ANAEROBES ISOLATED; CULTURE IN PROGRESS FOR 5 DAYS Performed at [facility]    Report Status PENDING  Incomplete  Tissue culture     Status: None   Collection Time: 08/14/14  2:36 PM  Result Value Ref Range Status   Specimen Description TISSUE  Final   Special Requests RIGHT ARM B  Final   Gram Stain   Final    RARE WBC PRESENT, PREDOMINANTLY PMN NO SQUAMOUS EPITHELIAL CELLS SEEN RARE GRAM POSITIVE COCCI IN PAIRS Performed at [facility]    Culture   Final    MODERATE STAPHYLOCOCCUS AUREUS Note: RIFAMPIN AND GENTAMICIN SHOULD NOT BE USED AS SINGLE DRUGS FOR TREATMENT OF STAPH INFECTIONS. Performed at [facility]    Report Status 08/17/2014 FINAL  Final   Organism ID, Bacteria STAPHYLOCOCCUS AUREUS  Final      Susceptibility   Staphylococcus aureus - MIC*    CLINDAMYCIN <=0.25 SENSITIVE Sensitive     ERYTHROMYCIN <=0.25 SENSITIVE Sensitive     GENTAMICIN <=0.5 SENSITIVE Sensitive     LEVOFLOXACIN 0.25 SENSITIVE Sensitive     OXACILLIN <=0.25 SENSITIVE Sensitive     PENICILLIN  0.06 SENSITIVE Sensitive     RIFAMPIN <=0.5 SENSITIVE Sensitive     TRIMETH/SULFA <=10 SENSITIVE Sensitive     VANCOMYCIN 1 SENSITIVE Sensitive     TETRACYCLINE <=1 SENSITIVE Sensitive     MOXIFLOXACIN <=0.25 SENSITIVE Sensitive     * MODERATE STAPHYLOCOCCUS AUREUS    Assessment: Tracy [name] is a 29 year-old lady with right upper extremity pyomyositis with MSSA. There was debate whether to initiate a cephalosporin antibiotic given her history of allergy to penicillin; however, she denies any anaphylactoid reaction to penicillin and received a cephalosporin (cefepime) in the last few years without adverse reaction. Moreover, we recommend pursuing treatment with cefazolin given the evidence shows this is a more effective in treating MSSA than vancomycin. We recommend offering a standing order of benadryl if the patient develops urticaria and we will follow the results of the drainage performed today.  Plan: 1. Switch vancomycin to cefazolin 2. We will follow the result of drainage in the right medial thigh  Loleta Chance, MD [facility] 08/17/2014, 2:14 PM   Addendum: Tracy [name] has grown MSSA. She has a history of tolerating cephalexin for an's in the recent past and tolerated her first dose of cefazolin this morning. Aspiration of her right thigh confirms that she has infection there as well. Gram stain and cultures are pending. She will need several more weeks of IV cefazolin therapy. I discussed her case with Dr. Amedeo Plenty today.  Michel Bickers, MD [facility] [phone] pager   [phone] cell  08/17/2014, 4:20 PM

## 2014-08-18 LAB — RENAL FUNCTION PANEL
Albumin: 1.6 g/dL — ABNORMAL LOW (ref 3.5–5.0)
Anion gap: 14 (ref 5–15)
BUN: 35 mg/dL — ABNORMAL HIGH (ref 6–20)
CHLORIDE: 100 mmol/L — AB (ref 101–111)
CO2: 22 mmol/L (ref 22–32)
CREATININE: 5.11 mg/dL — AB (ref 0.44–1.00)
Calcium: 8.2 mg/dL — ABNORMAL LOW (ref 8.9–10.3)
GFR calc Af Amer: 12 mL/min — ABNORMAL LOW (ref >60)
GFR calc non Af Amer: 10 mL/min — ABNORMAL LOW (ref >60)
Glucose, Bld: 253 mg/dL — ABNORMAL HIGH (ref 65–99)
Phosphorus: 3.7 mg/dL (ref 2.5–4.6)
Potassium: 3.6 mmol/L (ref 3.5–5.1)
Sodium: 136 mmol/L (ref 135–145)

## 2014-08-18 LAB — CBC
HEMATOCRIT: 28.2 % — AB (ref 36.0–46.0)
HEMOGLOBIN: 9.4 g/dL — AB (ref 12.0–15.0)
MCH: 28.3 pg (ref 26.0–34.0)
MCHC: 33.3 g/dL (ref 30.0–36.0)
MCV: 84.9 fL (ref 78.0–100.0)
Platelets: 477 10*3/uL — ABNORMAL HIGH (ref 150–400)
RBC: 3.32 MIL/uL — AB (ref 3.87–5.11)
RDW: 14.5 % (ref 11.5–15.5)
WBC: 21.6 10*3/uL — AB (ref 4.0–10.5)

## 2014-08-18 LAB — TYPE AND SCREEN
ABO/RH(D): O POS
ANTIBODY SCREEN: NEGATIVE
UNIT DIVISION: 0
Unit division: 0

## 2014-08-18 LAB — GLUCOSE, CAPILLARY
GLUCOSE-CAPILLARY: 319 mg/dL — AB (ref 65–99)
Glucose-Capillary: 385 mg/dL — ABNORMAL HIGH (ref 65–99)
Glucose-Capillary: 181 mg/dL — ABNORMAL HIGH (ref 65–99)
Glucose-Capillary: 129 mg/dL — ABNORMAL HIGH (ref 65–99)

## 2014-08-18 MED ORDER — INSULIN ASPART 100 UNIT/ML ~~LOC~~ SOLN
2.0000 [IU] | Freq: Three times a day (TID) | SUBCUTANEOUS | Status: DC
  Administered 2014-08-18 – 2014-08-23 (×8): 2 [IU] via SUBCUTANEOUS

## 2014-08-18 MED ORDER — INSULIN ASPART 100 UNIT/ML ~~LOC~~ SOLN
0.0000 [IU] | Freq: Three times a day (TID) | SUBCUTANEOUS | Status: DC
  Administered 2014-08-18: 3 [IU] via SUBCUTANEOUS
  Administered 2014-08-18: 11 [IU] via SUBCUTANEOUS
  Administered 2014-08-19: 2 [IU] via SUBCUTANEOUS
  Administered 2014-08-19: 3 [IU] via SUBCUTANEOUS
  Administered 2014-08-19: 2 [IU] via SUBCUTANEOUS
  Administered 2014-08-21: 5 [IU] via SUBCUTANEOUS
  Administered 2014-08-21 – 2014-08-22 (×2): 2 [IU] via SUBCUTANEOUS

## 2014-08-18 MED ORDER — INSULIN ASPART 100 UNIT/ML ~~LOC~~ SOLN: 0.0000 [IU] | Freq: Every day | SUBCUTANEOUS | Status: DC

## 2014-08-18 NOTE — Progress Notes (Signed)
Patient ID: Tracy [name], female   DOB: 1985/08/31, 29 y.o.   MRN: [id number]  [facility] Progress Note    Assessment/ Plan:   1. Acute renal failure on chronic kidney disease stage IV: Suspected to be hemodynamically mediated ATN and NSAIDs. Suspect that she may have progressed onto ESRD after recent acute renal insufficiency episode. Urine output marginal. She is status post 2 dialysis treatments for uremic symptoms. Monitor for renal recovery over the weekend with a better idea of her renal prognosis during/after the weekend. 2. Myositis right arm/right thigh-status post open biopsy of right arm and placement of JP drain by surgery- cultures with Staph - now on Clindamycin in addition to Vanco (levels high on labs yesterday). Status post drainage of purulent fluid collection from right thigh by interventional radiology-cultures pending 3. Anemia: Secondary to infection/ESA resistance and some surgical losses-PRBC transfusion at HD today 4. Hyponatremia: Most likely due to acute renal failure and defective free water handling-monitor with dialysis  Subjective:   Reports to be doing better today with regards to arm/thigh pain- anxious about chronic hemodialysis    Objective:   BP 138/74 mmHg  Pulse 89  Temp(Src) 98.3 F (36.8 C) (Oral)  Resp 18  Ht 5\' 3"  (1.6 m)  Wt 85.7 kg (188 lb 15 oz)  BMI 33.48 kg/m2  SpO2 94%  LMP 07/18/2014  Intake/Output Summary (Last 24 hours) at 08/18/14 1022 Last data filed at 08/18/14 [id number]  Gross per 24 hour  Intake      0 ml  Output    637 ml  Net   -637 ml   Weight change: 4 kg (8 lb 13.1 oz)  Physical Exam: [id number] resting in bed CVS: Pulse RRR, normal s1 and s2 Resp:CTA bilaterally, no rales/rhonchi [id number], flat, NT, BS normal Ext: Trace LE edema with dressing/drain right arm  Imaging: US Aspiration  08/17/2014   CLINICAL DATA:  Acute infectious myositis  EXAM: ULTRASOUND RIGHT THIGH intramuscular ABSCESS  NEEDLE ASPIRATION  Date:  7/6/20167/07/2014 3:41 pm  Radiologist:  M. Daryll Brod, MD  Guidance:  Ultrasound  FLUOROSCOPY TIME:  None.  MEDICATIONS AND MEDICAL HISTORY: 1.5 mg Versed, 50 mcg fentanyl  ANESTHESIA/SEDATION: 15 minutes  CONTRAST:  None  COMPLICATIONS: None  PROCEDURE: Informed consent was obtained from the patient following explanation of the procedure, risks, benefits and alternatives. The patient understands, agrees and consents for the procedure. All questions were addressed. A time out was performed.  Maximal barrier sterile technique utilized including caps, mask, sterile gowns, sterile gloves, large sterile drape, hand hygiene, and ChloraPrep.  Previous MRI was reviewed. Preliminary ultrasound performed of the right thigh. A heterogeneous complex appearing fluid collection is identified in the right vastus medialis muscle. This correlates with the MRI finding.  Under sterile conditions and local anesthesia, an 18 gauge access needle was advanced percutaneously under direct ultrasound into the intramuscular complex fluid collection. Syringe aspiration yielded 3 cc purulent fluid compatible with an intramuscular abscess. Sample sent for Gram stain and culture. Needle removed. No immediate complication.  IMPRESSION: Successful ultrasound aspiration of a right thigh vastus medialis intramuscular abscess. Gram stain and culture sent.   Electronically Signed   By: Jerilynn Mages.  Shick M.D.   On: 08/17/2014 15:50    Labs: BMET  Recent Labs Lab 08/12/14 0538 08/13/14 0342 08/14/14 0846 08/16/14 0634  NA 131* 130* 132* 132*  K 4.6 4.8 4.3 4.6  CL 102 102 104 104  CO2 15* 12* 17* 15*  GLUCOSE 215*  223* 103* 89  BUN 47* 54* 58* 69*  CREATININE 5.91* 6.37* 7.33* 8.65*  CALCIUM 8.5* 8.4* 8.7* 7.6*  PHOS 6.0* 6.4* 6.5*  --    CBC  Recent Labs Lab 08/13/14 0540 08/14/14 0846 08/16/14 0634 08/17/14 0530 08/18/14 0916  WBC 15.0* 18.4* 25.5* 20.8* 21.6*  NEUTROABS 12.6*  --   --   --   --    HGB 7.3* 8.2* 7.0* 6.9* 9.4*  HCT 22.3* 25.1* 20.9* 20.8* 28.2*  MCV 82.9 83.9 83.3 83.2 84.9  PLT 291 204 361 379 477*    Medications:    . sodium chloride   Intravenous Once  . amLODipine  10 mg Oral Daily  .  ceFAZolin (ANCEF) IV  2 g Intravenous Q24H  . heparin  5,000 Units Subcutaneous 3 times per day  . insulin aspart  0-9 Units Subcutaneous TID WC  . insulin glargine  15 Units Subcutaneous Daily  . levothyroxine  75 mcg Oral QAC breakfast  . metoprolol tartrate  50 mg Oral BID  . multivitamin with minerals  1 tablet Oral Daily  . senna-docusate  2 tablet Oral QHS  . sodium chloride  1,000 mL Intravenous Once  . sodium chloride  3 mL Intravenous Q12H   Elmarie Shiley, MD 08/18/2014, 10:22 AM

## 2014-08-18 NOTE — Progress Notes (Signed)
     Subjective:  Patient reports pain as mild to moderate.  Right arm pain has improved after I/D. She also reports the thigh pain is better after aspiration 08/17/14.  Purulent fluid collected.  Cultures still pending.   Objective:   VITALS:   Filed Vitals:   08/17/14 1742 08/17/14 2033 08/18/14 0309 08/18/14 0535  BP: 133/66 154/73  138/74  Pulse: 95 98  89  Temp: 98.5 F (36.9 C) 97.5 F (36.4 C) 101 F (38.3 C) 98.3 F (36.8 C)  TempSrc: Oral Oral  Oral  Resp: 18 17  18   Height:      Weight:  85.7 kg (188 lb 15 oz)    SpO2: 94% 95%  94%    Neurologically intact ABD soft Neurovascular intact Sensation intact distally Intact pulses distally Dorsiflexion/Plantar flexion intact   Lab Results  Component Value Date   WBC 20.8* 08/17/2014   HGB 6.9* 08/17/2014   HCT 20.8* 08/17/2014   MCV 83.2 08/17/2014   PLT 379 08/17/2014   BMET    Component Value Date/Time   NA 132* 08/16/2014 0634   NA 133* 01/18/2013 1106   K 4.6 08/16/2014 0634   CL 104 08/16/2014 0634   CO2 15* 08/16/2014 0634   GLUCOSE 89 08/16/2014 0634   GLUCOSE 929* 01/18/2013 1106   BUN 69* 08/16/2014 0634   BUN 37* 01/18/2013 1106   CREATININE 8.65* 08/16/2014 0634   CALCIUM 7.6* 08/16/2014 0634   GFRNONAA 6* 08/16/2014 0634   GFRAA 6* 08/16/2014 0634     Assessment/Plan: 4 Days Post-Op   Principal Problem:   Myositis Active Problems:   Proliferative diabetic retinopathy   Hypothyroidism   Normocytic anemia   Acute renal failure superimposed on stage 4 chronic kidney disease   Seizure   Sepsis   DM I (diabetes mellitus, type I), uncontrolled   Essential hypertension   Up with therapy WBAT in the RLE  Hopeful that aspiration may take care of pain and infection in the R thigh.  Clinically much better this morning.  We may end up still needing to take to the OR for washout if improvement doesn't continue.   We will have more of a definitive plan once cultures are back and patient  has been able to work with PT today.    Theressa Piedra [name] 08/18/2014, 9:22 AM Cell [phone]

## 2014-08-18 NOTE — Progress Notes (Signed)
Subjective: 4 Days Post-Op Procedure(s) (LRB): IRRIGATION AND DEBRIDEMENT EXTREMITY WITH MUSCLE BIOPSY (Right) Patient reports pain as mild.   She remains elevated. She does not have any worsening symptoms.  Her white blood cell count still remains high. Objective: Vital signs in last 24 hours: Temp:  [98.3 F (36.8 C)-101 F (38.3 C)] 98.6 F (37 C) (07/07 1732) Pulse Rate:  [86-89] 86 (07/07 1732) Resp:  [18] 18 (07/07 1732) BP: (132-138)/(70-74) 132/70 mmHg (07/07 1732) SpO2:  [94 %-95 %] 95 % (07/07 1732)  Intake/Output from previous day: 07/06 0701 - 07/07 0700 In: 764 [I.V.:100; Blood:664] Out: 637 [Urine:300; Stool:1] Intake/Output this shift:     Recent Labs  08/16/14 0634 08/17/14 0530 08/18/14 0916  HGB 7.0* 6.9* 9.4*    Recent Labs  08/17/14 0530 08/18/14 0916  WBC 20.8* 21.6*  RBC 2.50* 3.32*  HCT 20.8* 28.2*  PLT 379 477*    Recent Labs  08/16/14 0634 08/18/14 1508  NA 132* 136  K 4.6 3.6  CL 104 100*  CO2 15* 22  BUN 69* 35*  CREATININE 8.65* 5.11*  GLUCOSE 89 253*  CALCIUM 7.6* 8.2*    Recent Labs  08/16/14 0635  INR 1.40   Physical exam: Right arm is stable. Dressing is changed. She has much less edema and swelling. She remains intact to neurovascular examination. There is no signs of worsening in the right upper extremity. Shoulder and elbow are stable. We have performed a dressing change that difficulty and she tolerated this well.   I discussed with her all issues and the plans.  We'll continue to watch her closely.  Assessment/Plan: 4 Days Post-Op Procedure(s) (LRB): IRRIGATION AND DEBRIDEMENT EXTREMITY WITH MUSCLE BIOPSY (Right) She remained stable. Her drain was removed yesterday and her arm is continuing to remain stable at this juncture.  I discussed her care with Dr. Percell Miller today.  We will continue a very careful cautious approach with serialexams, dressingchanges, and observation  Await culture results from her  thigh at this time.  All questions answered and chart thoroughly reviewed. Paulene Floor 08/18/2014, 8:57 PM

## 2014-08-18 NOTE — Anesthesia Preprocedure Evaluation (Addendum)
Anesthesia Evaluation  Patient identified by MRN, date of birth, ID band Patient awake    Reviewed: Allergy & Precautions, NPO status , Patient's Chart, lab work & pertinent test results  Airway Mallampati: II  TM Distance: >3 FB Neck ROM: Full    Dental   Pulmonary neg pulmonary ROS,  breath sounds clear to auscultation        Cardiovascular hypertension, Pt. on medications Rhythm:Regular Rate:Normal     Neuro/Psych Seizures -,  Anxiety    GI/Hepatic   Endo/Other  diabetes, Insulin DependentHypothyroidism   Renal/GU CRFRenal disease     Musculoskeletal   Abdominal   Peds  Hematology  (+) anemia ,   Anesthesia Other Findings   Reproductive/Obstetrics                           Anesthesia Physical Anesthesia Plan  ASA: III  Anesthesia Plan: General   Post-op Pain Management:    Induction: Intravenous  Airway Management Planned: LMA  Additional Equipment:   Intra-op Plan:   Post-operative Plan:   Informed Consent: I have reviewed the patients History and Physical, chart, labs and discussed the procedure including the risks, benefits and alternatives for the proposed anesthesia with the patient or authorized representative who has indicated his/her understanding and acceptance.   Dental advisory given  Plan Discussed with: CRNA  Anesthesia Plan Comments:         Anesthesia Quick Evaluation

## 2014-08-18 NOTE — Progress Notes (Signed)
Patient Demographics:    Tracy [name], is a 29 y.o. female, DOB - Sep 18, 1985, [id number]  Admit date - 08/09/2014   Admitting Physician Ivor Costa, MD  Outpatient Primary MD for the patient is [name]  LOS - 9   Chief Complaint  Patient presents with  . Arm Pain        Subjective:    Tracy [name] today has, No headache, No chest pain, No abdominal pain - No Nausea, No new weakness tingling or numbness, No Cough - SOB. Continues to have right arm and right thigh pain but overall feels much improved.   Assessment  & Plan :     1. Myositis of the right arm and right thigh. Most likely post physical strain 2 weeks ago while throwing horse feed with some muscle injury with secondary infection. Case discussed with rheumatologist Dr. Amil Amen over the phone on the day of admission and then on 08/14/2014 she was also seen this admission by general surgery, hand surgery and orthopedics.   She is post right arm muscle biopsy, fluid drainage and JP drain placement on 08/14/2014 by hand surgery and right leg CT-guided drained by IR on 08/18/2014, so far cultures growing MSSA. ID on board now on cefazolin IV per ID. Source of bacteremia unclear, echocardiogram unremarkable, denies any IV drug use.  Discussed with Dr. Edmonia Lynch orthopedic he will follow closely he will think about taking the patient to the OR for a open washout depending on the clinical situation in the next few days. Clinically she is improving with pain better in both right upper and right lower extremity.   Ultrasound of the right upper arm does not show a clot.    2. Hypothyroidism. Was not taking Synthroid for the last couple of months, counseled on compliance, TSH was 25, Synthroid dose adjusted TSH is  improving.    3. Anemia - iron deficiency on into anemia panel, IV and replaced. Transfuse 2 units of packed RBC on 08/17/2014 with dialysis. Outpatient workup for iron deficiency anemia in a menstruating female as appropriate.    4.ARF of CKD 4 - baseline creatinine of 3, had some nausea vomiting and NSAID use prior to admission, likely ATN. Avoid nephrotoxins, continue hydration, renal ultrasound shows cyst but no obstruction, urine output stable, Renal following. Started on dialysis on 08/16/2014 via right IJ dialysis catheter placed by IR 08/15/2014 could require permanent dialysis.    5. DM1 - poor outpatient control with A1c of 8.9 , on Lantus, sliding scale adjusted placed on pre-meal NovoLog, question dietary compliance with rapid fluctuation in sugars over the last 24 hours.  CBG (last 3)   Recent Labs  08/17/14 1641 08/17/14 2125 08/18/14 0743  GLUCAP 149* 319* 385*    Lab Results  Component Value Date   HGBA1C 8.9* 08/10/2014     6. UA appears clean with 0-2 WBCs ruling out UTI, cultures growing lactobacilli which likely is contamination from vaginal flora. Monitor clinically.     Code Status : Full  Family Communication  : Mother bedside, boyfriend bedside  Disposition Plan  : Home   Consults  :  Orthopedics, hand surgery, general surgery, Renal, rheumatologist Dr. Amil Amen over the phone 2, ID  Procedures  :  R Arm - Muscle biopsy, fluid drainage and JP drain placement 08-14-14 by Dr Amedeo Plenty.  Right upper extremity venous duplex. No DVT, MRI right arm and right leg x 2, CT right thigh  Right IJ dialysis catheter placed by IR on 08/15/2014  Started on dialysis 08/16/2014  R Leg US guided Aspiration by IR on 08-17-14   TTE  - Left ventricle: The cavity size was normal. Wall thickness wasincreased in a pattern of mild LVH. Systolic function wasvigorous. The estimated ejection fraction was in the range of 65%to 70%. Wall motion was normal; there were  no regional wallmotion abnormalities. Left ventricular diastolic functionparameters were normal.  - Mitral valve: There was mild regurgitation.  - Pericardium, extracardiac: A trivial pericardial effusion wasidentified. Features were not consistent with tamponadephysiology.   DVT Prophylaxis  :   Heparin    Lab Results  Component Value Date   PLT 477* 08/18/2014    Inpatient Medications  Scheduled Meds: . sodium chloride   Intravenous Once  . amLODipine  10 mg Oral Daily  .  ceFAZolin (ANCEF) IV  2 g Intravenous Q24H  . heparin  5,000 Units Subcutaneous 3 times per day  . insulin aspart  0-9 Units Subcutaneous TID WC  . insulin glargine  15 Units Subcutaneous Daily  . levothyroxine  75 mcg Oral QAC breakfast  . metoprolol tartrate  50 mg Oral BID  . multivitamin with minerals  1 tablet Oral Daily  . senna-docusate  2 tablet Oral QHS  . sodium chloride  1,000 mL Intravenous Once  . sodium chloride  3 mL Intravenous Q12H   Continuous Infusions:   PRN Meds:.acetaminophen **OR** acetaminophen, alum & mag hydroxide-simeth, diphenhydrAMINE, hydrALAZINE, HYDROcodone-acetaminophen, HYDROmorphone (DILAUDID) injection, ondansetron, polyethylene glycol, zolpidem  Antibiotics  :     Anti-infectives    Start     Dose/Rate Route Frequency Ordered Stop   08/17/14 1200  ceFAZolin (ANCEF) IVPB 2 g/50 mL premix     2 g 100 mL/hr over 30 Minutes Intravenous Every 24 hours 08/17/14 1003     08/15/14 1600  clindamycin (CLEOCIN) IVPB 900 mg  Status:  Discontinued     900 mg 100 mL/hr over 30 Minutes Intravenous Every 8 hours 08/15/14 1529 08/16/14 1559   08/15/14 1530  clindamycin (CLEOCIN) IVPB 300 mg  Status:  Discontinued     300 mg 100 mL/hr over 30 Minutes Intravenous 3 times per day 08/15/14 1522 08/15/14 1531   08/15/14 1430  aztreonam (AZACTAM) 500 mg in dextrose 5 % 50 mL IVPB  Status:  Discontinued     500 mg 100 mL/hr over 30 Minutes Intravenous Every 8 hours 08/15/14 1118  08/15/14 1522   08/11/14 2200  vancomycin (VANCOCIN) IVPB 1000 mg/200 mL premix  Status:  Discontinued     1,000 mg 200 mL/hr over 60 Minutes Intravenous Every 48 hours 08/09/14 2256 08/16/14 0716   08/10/14 1045  vancomycin (VANCOCIN) IVPB 750 mg/150 ml premix  Status:  Discontinued     750 mg 150 mL/hr over 60 Minutes Intravenous Every 12 hours 08/09/14 2252 08/09/14 2256   08/09/14 2230  aztreonam (AZACTAM) 1 g in dextrose 5 % 50 mL IVPB  Status:  Discontinued     1 g 100 mL/hr over 30 Minutes Intravenous Every 8 hours 08/09/14 2215 08/15/14 1118   08/09/14 2200  vancomycin (VANCOCIN) IVPB 1000 mg/200 mL premix  Status:  Discontinued     1,000 mg 200 mL/hr over 60 Minutes Intravenous Every 48 hours 08/09/14  2159 08/09/14 2252   08/09/14 2145  vancomycin (VANCOCIN) IVPB 1000 mg/200 mL premix  Status:  Discontinued     1,000 mg 200 mL/hr over 60 Minutes Intravenous  Once 08/09/14 2137 08/09/14 2147        Objective:   Filed Vitals:   08/17/14 1742 08/17/14 2033 08/18/14 0309 08/18/14 0535  BP: 133/66 154/73  138/74  Pulse: 95 98  89  Temp: 98.5 F (36.9 C) 97.5 F (36.4 C) 101 F (38.3 C) 98.3 F (36.8 C)  TempSrc: Oral Oral  Oral  Resp: 18 17  18   Height:      Weight:  85.7 kg (188 lb 15 oz)    SpO2: 94% 95%  94%    Wt Readings from Last 3 Encounters:  08/17/14 85.7 kg (188 lb 15 oz)  08/08/14 68.493 kg (151 lb)  08/05/14 68.493 kg (151 lb)     Intake/Output Summary (Last 24 hours) at 08/18/14 1100 Last data filed at 08/18/14 [id number]  Gross per 24 hour  Intake      0 ml  Output    301 ml  Net   -301 ml     Physical Exam  Awake Alert, Oriented X 3, No new F.N deficits, Normal affect [place].AT,PERRAL Supple Neck,No JVD, No cervical lymphadenopathy appriciated.  Symmetrical Chest wall movement, Good air movement bilaterally, CTAB RRR,No Gallops,Rubs or new Murmurs, No Parasternal Heave +ve B.Sounds, Abd Soft, No tenderness, No organomegaly appriciated, No rebound -  guarding or rigidity. No Cyanosis, Clubbing or edema, No new Rash or bruise  Swelling in the right arm and right thigh, some tenderness, no warmth, no signs of compartment syndrome    Data Review:   Micro Results Recent Results (from the past 240 hour(s))  Urine culture     Status: None   Collection Time: 08/09/14  1:57 PM  Result Value Ref Range Status   Specimen Description URINE, CLEAN CATCH  Final   Special Requests NONE  Final   Culture >=100,000 COLONIES/mL LACTOBACILLUS SPECIES  Final   Report Status 08/11/2014 FINAL  Final  Culture, blood (x 2)     Status: None   Collection Time: 08/09/14 11:42 PM  Result Value Ref Range Status   Specimen Description BLOOD LEFT HAND  Final   Special Requests BOTTLES DRAWN AEROBIC ONLY [place]  Final   Culture NO GROWTH 5 DAYS  Final   Report Status 08/15/2014 FINAL  Final  Culture, blood (x 2)     Status: None   Collection Time: 08/09/14 11:50 PM  Result Value Ref Range Status   Specimen Description BLOOD RIGHT HAND  Final   Special Requests   Final    BOTTLES DRAWN AEROBIC AND ANAEROBIC Vincent BLUE 5CC PURPLE   Culture NO GROWTH 5 DAYS  Final   Report Status 08/15/2014 FINAL  Final  Anaerobic culture     Status: None (Preliminary result)   Collection Time: 08/14/14  2:32 PM  Result Value Ref Range Status   Specimen Description FLUID  Final   Special Requests RIGHT ARM FLUID A  Final   Gram Stain   Final    MODERATE WBC PRESENT,BOTH PMN AND MONONUCLEAR NO SQUAMOUS EPITHELIAL CELLS SEEN RARE GRAM POSITIVE COCCI IN PAIRS IN CLUSTERS Performed at [facility]    Culture   Final    NO ANAEROBES ISOLATED; CULTURE IN PROGRESS FOR 5 DAYS Performed at [facility]    Report Status PENDING  Incomplete  Culture, routine-abscess  Status: None   Collection Time: 08/14/14  2:32 PM  Result Value Ref Range Status   Specimen Description ARM RIGHT  Final   Special Requests NONE  Final   Gram Stain   Final    FEW WBC PRESENT,  PREDOMINANTLY PMN NO SQUAMOUS EPITHELIAL CELLS SEEN RARE GRAM POSITIVE COCCI IN CLUSTERS Performed at [facility]    Culture   Final    FEW STAPHYLOCOCCUS AUREUS Note: RIFAMPIN AND GENTAMICIN SHOULD NOT BE USED AS SINGLE DRUGS FOR TREATMENT OF STAPH INFECTIONS. Performed at [facility]    Report Status 08/17/2014 FINAL  Final   Organism ID, Bacteria STAPHYLOCOCCUS AUREUS  Final      Susceptibility   Staphylococcus aureus - MIC*    CLINDAMYCIN <=0.25 SENSITIVE Sensitive     ERYTHROMYCIN <=0.25 SENSITIVE Sensitive     GENTAMICIN <=0.5 SENSITIVE Sensitive     LEVOFLOXACIN 0.25 SENSITIVE Sensitive     OXACILLIN <=0.25 SENSITIVE Sensitive     PENICILLIN 0.06 SENSITIVE Sensitive     RIFAMPIN <=0.5 SENSITIVE Sensitive     TRIMETH/SULFA <=10 SENSITIVE Sensitive     VANCOMYCIN 1 SENSITIVE Sensitive     TETRACYCLINE <=1 SENSITIVE Sensitive     MOXIFLOXACIN <=0.25 SENSITIVE Sensitive     * FEW STAPHYLOCOCCUS AUREUS  Anaerobic culture     Status: None (Preliminary result)   Collection Time: 08/14/14  2:36 PM  Result Value Ref Range Status   Specimen Description TISSUE  Final   Special Requests RIGHT ARM B  Final   Gram Stain   Final    NO WBC SEEN NO SQUAMOUS EPITHELIAL CELLS SEEN RARE GRAM POSITIVE COCCI IN PAIRS IN CLUSTERS Performed at [facility]    Culture   Final    NO ANAEROBES ISOLATED; CULTURE IN PROGRESS FOR 5 DAYS Performed at [facility]    Report Status PENDING  Incomplete  Tissue culture     Status: None   Collection Time: 08/14/14  2:36 PM  Result Value Ref Range Status   Specimen Description TISSUE  Final   Special Requests RIGHT ARM B  Final   Gram Stain   Final    RARE WBC PRESENT, PREDOMINANTLY PMN NO SQUAMOUS EPITHELIAL CELLS SEEN RARE GRAM POSITIVE COCCI IN PAIRS Performed at [facility]    Culture   Final    MODERATE STAPHYLOCOCCUS AUREUS Note: RIFAMPIN AND GENTAMICIN SHOULD NOT BE USED AS SINGLE DRUGS  FOR TREATMENT OF STAPH INFECTIONS. Performed at [facility]    Report Status 08/17/2014 FINAL  Final   Organism ID, Bacteria STAPHYLOCOCCUS AUREUS  Final      Susceptibility   Staphylococcus aureus - MIC*    CLINDAMYCIN <=0.25 SENSITIVE Sensitive     ERYTHROMYCIN <=0.25 SENSITIVE Sensitive     GENTAMICIN <=0.5 SENSITIVE Sensitive     LEVOFLOXACIN 0.25 SENSITIVE Sensitive     OXACILLIN <=0.25 SENSITIVE Sensitive     PENICILLIN 0.06 SENSITIVE Sensitive     RIFAMPIN <=0.5 SENSITIVE Sensitive     TRIMETH/SULFA <=10 SENSITIVE Sensitive     VANCOMYCIN 1 SENSITIVE Sensitive     TETRACYCLINE <=1 SENSITIVE Sensitive     MOXIFLOXACIN <=0.25 SENSITIVE Sensitive     * MODERATE STAPHYLOCOCCUS AUREUS    Radiology Reports Dg Elbow Complete Right  08/05/2014   CLINICAL DATA:  Right elbow pain that radiates to the shoulder. Pain started after picking up horse feed.  EXAM: RIGHT ELBOW - COMPLETE 3+ VIEW  COMPARISON:  None.  FINDINGS: There is no evidence of fracture, dislocation, or joint effusion. There is no evidence of arthropathy or other focal bone abnormality. Soft tissues are unremarkable.  IMPRESSION: Negative.   Electronically Signed   By: Markus Daft M.D.   On: 08/05/2014 08:05   Mr Humerus Right Wo Contrast  08/12/2014   CLINICAL DATA:  Myositis in the right arm.  Injury 2 weeks ago.  EXAM: MRI OF THE RIGHT HUMERUS WITHOUT CONTRAST  TECHNIQUE: Multiplanar, multisequence MR imaging was performed. No intravenous contrast was administered.  COMPARISON:  08/10/2014  FINDINGS: Dependent atelectasis in the right lower lobe.  There is considerable abnormal edema in the lateral, long, and medial head of the triceps diffusely. The internal tendon of the medial head seems torn on images 20 through 23 of series 7, with an associated 2.2 by 0.6 by 1.4 cm fluid collection along the proximal margin of the tendon.  There is edema in the coracobrachialis muscle tendon a small medial portion of the  brachialis muscle.  Left axillary lymph nodes measure up to 1.2 cm in short axis. Edema tracks along the axillary neurovascular structures.  Fluid signal tracks superficial to the posterior deltoid muscle and superficial to the latissimus dorsi and infraspinatus and teres minor muscles.  Subcutaneous edema tracks in the upper arm circumferentially, somewhat more confluent anteriorly.  Small elbow joint effusion.  No significant abnormal osseous edema.  IMPRESSION: 1. Again observed is extensive myositis primarily involving the triceps and coracobrachialis muscles, with a small intrasubstance fluid collection along the proximal tendinous margin of the medial head of the triceps which may represent musculotendinous tear or less likely a very small abscess. This collection is similar to prior. Possibilities include posttraumatic myositis, infection, or a vascular etiology. Mild reactive axillary adenopathy on the right may suggest an infectious component. 2. Subcutaneous edema in the upper arm, mildly increased. Cellulitis not excluded. 3. Edema tracks along the superficial margin of the deltoid muscle, teres minor, and infraspinatus as well as the latissimus dorsi. This may be incidental but shear injury cannot be excluded.   Electronically Signed   By: Van Clines M.D.   On: 08/12/2014 08:46   Mr Humerus Right Wo Contrast  08/10/2014   CLINICAL DATA:  Right arm pain after an injury throwing a bag of horse feed 2 weeks ago.  EXAM: MRI OF THE RIGHT HUMERUS WITHOUT CONTRAST  TECHNIQUE: Multiplanar, multisequence MR imaging was performed. No intravenous contrast was administered.  COMPARISON:  MRI dated 08/08/2014 and radiograph dated 05/05/2014  FINDINGS: The edema in the right triceps muscle is even more extensive than on the prior study. The muscle is larger than on the prior study. There is only slight sparing of the proximal aspect of the muscle. Subcutaneous edema is more prominent. The underlying humerus  and the neurovascular bundle are normal. The focal area of abnormal signal at the distal mild tendinous junction is unchanged.  IMPRESSION: Progressive edema of the triceps muscle consistent with myositis. Focal area of abnormal signal at the mild tendinous junction could represent a tear or small abscess but this is not progressed. Given the patient's elevated white blood count, infectious myositis must be considered.   Electronically Signed   By: Lorriane Shire M.D.   On: 08/10/2014 08:50   Mr Humerus Right Wo Contrast  08/09/2014   CLINICAL DATA:  Pain throughout entire right humerus for 2 weeks.  EXAM: MRI OF THE RIGHT HUMERUS WITHOUT CONTRAST  TECHNIQUE: Multiplanar, multisequence MR imaging was performed.  No intravenous contrast was administered.  COMPARISON:  None.  FINDINGS: No marrow signal abnormality. No fracture or dislocation. There is no bone destruction or periosteal reaction.  There is severe edema within the right triceps muscle with small areas of sparing proximally. At the distal musculotendinous junction of the medial head of the triceps, there is a complex 2 x 2 x 2.3 cm T2 signal abnormality with central T2 hyperintensity and peripheral intermediate signal. There is mild edema in the adjacent subcutaneous fat.  The biceps musculature is normal. There is no other fluid collection or soft tissue mass. The neurovascular bundles are grossly normal. There are multiple prominent right axillary lymph nodes.  IMPRESSION: 1. Severe edema within the right triceps muscle with small areas of sparing proximally. At the distal musculotendinous junction of the medial head of the triceps, there is a complex 2 x 2 x 2.3 cm T2 signal abnormality. The overall appearance is concerning for myositis which may be secondary to an infectious or inflammatory etiology. The 2 cm area of focal abnormal signal which may reflect a hematoma secondary to a partial tear given the location is at the musculotendinous junction  of the medial head of the triceps, but given the extensiveness of the muscle edema, this may reflect a developing abscess versus myonecrosis.   Electronically Signed   By: Kathreen Devoid   On: 08/09/2014 08:31   US Renal  08/10/2014   CLINICAL DATA:  Acute kidney injury.  Elevated creatinine level.  EXAM: RENAL / URINARY TRACT ULTRASOUND COMPLETE  COMPARISON:  CT examination 03/09/2013 and ultrasound 10/04/2008  FINDINGS: Right Kidney:  Length: 10.6 cm. The right kidney parenchyma is echogenic without hydronephrosis. There is a heterogeneous hypoechoic structure along the upper pole measuring up to 1.1 cm. Previously, the patient had an abscess in the right kidney and unclear if this sequela from the abscess or a new small complex lesion.  Left Kidney:  Length: 10.6 cm. Left kidney is not well visualized but appears to be echogenic. Negative for hydronephrosis.  Bladder:  Appears normal for degree of bladder distention.  IMPRESSION: Negative for hydronephrosis.  Increased echogenicity of both kidneys. Findings raise concern for chronic medical renal disease.  There is a 1.1 cm heterogeneous hypoechoic structure in the right kidney upper pole. Unclear if this is the sequelae of previous renal abscess versus new small complex cystic lesion. Evaluation of this area will be difficult due to acute kidney injury and an elevated creatinine level. However, this may be better characterized with a non contrast MRI versus close surveillance with ultrasound.   Electronically Signed   By: Markus Daft M.D.   On: 08/10/2014 11:34   Ct Femur Right Wo Contrast  08/10/2014   CLINICAL DATA:  Myositis. Sudden onset of pain while lifting a feed bag 2 weeks ago.  EXAM: CT OF THE LOWER RIGHT EXTREMITY WITHOUT CONTRAST  TECHNIQUE: Multidetector CT imaging of the right femur and right tibia fibula was performed according to the standard protocol.  COMPARISON:  None.  FINDINGS: There is no bony abnormality. There is no radiopaque foreign  body. There is no soft tissue mass. There is no abscess or drainable fluid collection. There is no soft tissue gas.  There is soft tissue edema around the muscles of the right lower extremity, particularly around the adductor of the via and the quadriceps musculature. This is consistent with the clinically described suspicion of myositis. There is a small knee joint effusion.  IMPRESSION: Soft tissue edema  surrounding the musculature of the right lower extremity consistent with myositis. No mass or drainable fluid collection.   Electronically Signed   By: Andreas Newport M.D.   On: 08/10/2014 03:49   Ct Tibia Fibula Right Wo Contrast  08/10/2014   CLINICAL DATA:  Myositis. Sudden onset of pain while lifting a feed bag 2 weeks ago.  EXAM: CT OF THE LOWER RIGHT EXTREMITY WITHOUT CONTRAST  TECHNIQUE: Multidetector CT imaging of the right femur and right tibia fibula was performed according to the standard protocol.  COMPARISON:  None.  FINDINGS: There is no bony abnormality. There is no radiopaque foreign body. There is no soft tissue mass. There is no abscess or drainable fluid collection. There is no soft tissue gas.  There is soft tissue edema around the muscles of the right lower extremity, particularly around the adductor of the via and the quadriceps musculature. This is consistent with the clinically described suspicion of myositis. There is a small knee joint effusion.  IMPRESSION: Soft tissue edema surrounding the musculature of the right lower extremity consistent with myositis. No mass or drainable fluid collection.   Electronically Signed   By: Andreas Newport M.D.   On: 08/10/2014 03:49   Mr Femur Right Wo Contrast  08/12/2014   CLINICAL DATA:  Myositis of the right arm and right thigh. Status post physical strain 2 weeks ago.  EXAM: MRI OF THE RIGHT FEMUR WITHOUT CONTRAST  TECHNIQUE: Multiplanar, multisequence MR imaging of the right femur was performed. No intravenous contrast was administered.   COMPARISON:  CT right femur 08/10/2014  FINDINGS: There is severe muscle edema within the vastus medialis and intermedius muscle. There is a 14 mm more focal area of fluid signal within the vastus medialis which may reflect a more focal edema versus a small fluid collection. There is mild muscle edema within the vastus lateralis and rectus femoris. There is severe muscle edema within the sartorius muscle. There is a small amount of fluid along the fascia of the flexor compartment. There is small amount of fluid along the fascia of the flexor compartment and around the adductor compartment. There is no drainable focal fluid collection. There is no hematoma.  There is no focal marrow signal abnormality. There is no fracture or subluxation. No periosteal reaction.  IMPRESSION: 1. Severe muscle edema within the vastus medialis, vastus intermedius and sartorius muscles most consistent with myositis secondary to an infectious or inflammatory etiology. 14 mm more focal area of fluid signal within the vastus medialis which may reflect a more focal edema versus a small fluid collection. Low level edema within the vastus lateralis and rectus femoris likely reflecting myositis to a much lesser degree.   Electronically Signed   By: Kathreen Devoid   On: 08/12/2014 08:57   US Aspiration  08/17/2014   CLINICAL DATA:  Acute infectious myositis  EXAM: ULTRASOUND RIGHT THIGH intramuscular ABSCESS NEEDLE ASPIRATION  Date:  7/6/20167/07/2014 3:41 pm  Radiologist:  M. Daryll Brod, MD  Guidance:  Ultrasound  FLUOROSCOPY TIME:  None.  MEDICATIONS AND MEDICAL HISTORY: 1.5 mg Versed, 50 mcg fentanyl  ANESTHESIA/SEDATION: 15 minutes  CONTRAST:  None  COMPLICATIONS: None  PROCEDURE: Informed consent was obtained from the patient following explanation of the procedure, risks, benefits and alternatives. The patient understands, agrees and consents for the procedure. All questions were addressed. A time out was performed.  Maximal barrier  sterile technique utilized including caps, mask, sterile gowns, sterile gloves, large sterile drape, hand hygiene, and ChloraPrep.  Previous MRI was reviewed. Preliminary ultrasound performed of the right thigh. A heterogeneous complex appearing fluid collection is identified in the right vastus medialis muscle. This correlates with the MRI finding.  Under sterile conditions and local anesthesia, an 18 gauge access needle was advanced percutaneously under direct ultrasound into the intramuscular complex fluid collection. Syringe aspiration yielded 3 cc purulent fluid compatible with an intramuscular abscess. Sample sent for Gram stain and culture. Needle removed. No immediate complication.  IMPRESSION: Successful ultrasound aspiration of a right thigh vastus medialis intramuscular abscess. Gram stain and culture sent.   Electronically Signed   By: Jerilynn Mages.  Shick M.D.   On: 08/17/2014 15:50   Ir Fluoro Guide Cv Line Right  08/15/2014   CLINICAL DATA:  Diabetes, acute renal insufficiency  EXAM: EXAM RIGHT IJ CATHETER PLACEMENT UNDER ULTRASOUND AND FLUOROSCOPIC GUIDANCE  TECHNIQUE: The procedure, risks (including but not limited to bleeding, infection, organ damage, pneumothorax), benefits, and alternatives were explained to the patient. Questions regarding the procedure were encouraged and answered. The patient understands and consents to the procedure. Patency of the right IJ vein was confirmed with ultrasound with image documentation. An appropriate skin site was determined. Skin site was marked. Region was prepped using maximum barrier technique including cap and mask, sterile gown, sterile gloves, large sterile sheet, and Chlorhexidine as cutaneous antisepsis. The region was infiltrated locally with 1% lidocaine. Under real-time ultrasound guidance, the right IJ vein was accessed with a 19 gauge needle; the needle tip within the vein was confirmed with ultrasound image documentation. The needle exchanged over a  guidewire for vascular dilator which allowed advancement of a 20 cm Trialysis catheter. This was positioned with the tip at the cavoatrial junction. Spot chest radiograph shows good positioning and no pneumothorax. Catheter was flushed and sutured externally with 0-Prolene sutures. Patient tolerated the procedure well.  FLUOROSCOPY TIME:  1.1 mGy, 6 seconds  COMPLICATIONS: COMPLICATIONS none  IMPRESSION: 1. Technically successful right IJ Trialysis catheter placement.   Electronically Signed   By: Lucrezia Europe M.D.   On: 08/15/2014 11:00   Ir US Guide Vasc Access Right  08/15/2014   CLINICAL DATA:  Diabetes, acute renal insufficiency  EXAM: EXAM RIGHT IJ CATHETER PLACEMENT UNDER ULTRASOUND AND FLUOROSCOPIC GUIDANCE  TECHNIQUE: The procedure, risks (including but not limited to bleeding, infection, organ damage, pneumothorax), benefits, and alternatives were explained to the patient. Questions regarding the procedure were encouraged and answered. The patient understands and consents to the procedure. Patency of the right IJ vein was confirmed with ultrasound with image documentation. An appropriate skin site was determined. Skin site was marked. Region was prepped using maximum barrier technique including cap and mask, sterile gown, sterile gloves, large sterile sheet, and Chlorhexidine as cutaneous antisepsis. The region was infiltrated locally with 1% lidocaine. Under real-time ultrasound guidance, the right IJ vein was accessed with a 19 gauge needle; the needle tip within the vein was confirmed with ultrasound image documentation. The needle exchanged over a guidewire for vascular dilator which allowed advancement of a 20 cm Trialysis catheter. This was positioned with the tip at the cavoatrial junction. Spot chest radiograph shows good positioning and no pneumothorax. Catheter was flushed and sutured externally with 0-Prolene sutures. Patient tolerated the procedure well.  FLUOROSCOPY TIME:  1.1 mGy, 6 seconds   COMPLICATIONS: COMPLICATIONS none  IMPRESSION: 1. Technically successful right IJ Trialysis catheter placement.   Electronically Signed   By: Lucrezia Europe M.D.   On: 08/15/2014 11:00   Dg Femur, Min  2 Views Right  08/10/2014   CLINICAL DATA:  Right thigh pain. Concern for myositis. Rule out soft tissue air.  EXAM: RIGHT FEMUR 2 VIEWS  COMPARISON:  None.  FINDINGS: Cortical margins of the right femur are intact. No fracture, focal osseous lesion, erosion or periosteal reaction. No soft tissue air or radiopaque foreign body. Questionable soft tissue edema proximally.  IMPRESSION: Suspect mild soft tissue edema proximally. No soft tissue air, radiopaque foreign body, or acute osseous abnormality.   Electronically Signed   By: Jeb Levering M.D.   On: 08/10/2014 01:48     CBC  Recent Labs Lab 08/13/14 0540 08/14/14 0846 08/16/14 0634 08/17/14 0530 08/18/14 0916  WBC 15.0* 18.4* 25.5* 20.8* 21.6*  HGB 7.3* 8.2* 7.0* 6.9* 9.4*  HCT 22.3* 25.1* 20.9* 20.8* 28.2*  PLT 291 204 361 379 477*  MCV 82.9 83.9 83.3 83.2 84.9  MCH 27.1 27.4 27.9 27.6 28.3  MCHC 32.7 32.7 33.5 33.2 33.3  RDW 14.0 14.5 14.4 14.2 14.5  LYMPHSABS 0.9  --   --   --   --   MONOABS 1.4*  --   --   --   --   EOSABS 0.1  --   --   --   --   BASOSABS 0.1  --   --   --   --     Chemistries   Recent Labs Lab 08/12/14 0538 08/13/14 0342 08/14/14 0846 08/16/14 0634  NA 131* 130* 132* 132*  K 4.6 4.8 4.3 4.6  CL 102 102 104 104  CO2 15* 12* 17* 15*  GLUCOSE 215* 223* 103* 89  BUN 47* 54* 58* 69*  CREATININE 5.91* 6.37* 7.33* 8.65*  CALCIUM 8.5* 8.4* 8.7* 7.6*   ------------------------------------------------------------------------------------------------------------------ estimated creatinine clearance is 9.9 mL/min (by C-G formula based on Cr of 8.65). ------------------------------------------------------------------------------------------------------------------ No results for input(s): HGBA1C in the last 72  hours. ------------------------------------------------------------------------------------------------------------------ No results for input(s): CHOL, HDL, LDLCALC, TRIG, CHOLHDL, LDLDIRECT in the last 72 hours. ------------------------------------------------------------------------------------------------------------------  Recent Labs  08/17/14 0530  TSH 7.256*   ------------------------------------------------------------------------------------------------------------------ No results for input(s): VITAMINB12, FOLATE, FERRITIN, TIBC, IRON, RETICCTPCT in the last 72 hours.  Coagulation profile  Recent Labs Lab 08/16/14 0635  INR 1.40    No results for input(s): DDIMER in the last 72 hours.  Cardiac Enzymes No results for input(s): CKMB, TROPONINI, MYOGLOBIN in the last 168 hours.  Invalid input(s): CK ------------------------------------------------------------------------------------------------------------------ Invalid input(s): POCBNP   Time Spent in minutes  35   SINGH,PRASHANT K M.D on 08/18/2014 at 11:00 AM  Between 7am to 7pm - Pager - [phone]  After 7pm go to [email or web address] - password [facility]  [name]   Office  [phone]

## 2014-08-18 NOTE — Progress Notes (Signed)
Physical Therapy Treatment Patient Details Name: Tracy [name] MRN: [id number] DOB: 1985-10-07 Today's Date: 08/18/2014    History of Present Illness Myositis of the right arm and right thigh. Most likely post physical strain 2 weeks ago while throwing horse feed.    PT Comments    Patient progressing with ambulation and therex this session both for arm and leg AROM as well as for lumbosacral mobility due to reports pain in back from lying in bed.  Patient able to negotiate stairs and feel appropriate for nursing assist for ambulation.  Will follow up.  Follow Up Recommendations  Outpatient PT     Equipment Recommendations  Cane    Recommendations for Other Services       Precautions / Restrictions Precautions Precautions: None    Mobility  Bed Mobility               General bed mobility comments: patient in recliner  Transfers     Transfers: Sit to/from Stand Sit to Stand: Modified independent (Device/Increase time);Supervision         General transfer comment: assist only for set up due to IV  Ambulation/Gait Ambulation/Gait assistance: Supervision;Min guard Ambulation Distance (Feet): 180 Feet Assistive device:  (pushing IV pole) Gait Pattern/deviations: Wide base of support;Antalgic;Step-through pattern     General Gait Details: mobilizing in room to bathroom per pt. frequently, IV for support due to some degree of antalgia and mostly due to thigh swelling   Stairs Stairs: Yes Stairs assistance: Min guard Stair Management: One rail Left Number of Stairs: 3 General stair comments: left rail ascending, right hand on rail and left HHA for descent; leads with left up and right down  Wheelchair Mobility    Modified Rankin (Stroke Patients Only)       Balance             Standing balance-Leahy Scale: Good Standing balance comment: able to turn on stairs with minguard                    Cognition Arousal/Alertness:  Awake/alert Behavior During Therapy: WFL for tasks assessed/performed Overall Cognitive Status: Within Functional Limits for tasks assessed                      Exercises General Exercises - Upper Extremity Shoulder Flexion: AROM;Right;Seated Elbow Flexion: AROM;Right;Seated;5 reps Elbow Extension: AROM;5 reps;Right;Seated Wrist Flexion: AROM;Right;5 reps;Seated General Exercises - Lower Extremity Ankle Circles/Pumps: AROM;Right;5 reps;Seated Long Arc Quad: AROM;Right;5 reps;Seated Other Exercises Other Exercises: demonstrated low trunk rotation with assist, left single knee to chest stretch and hamstring stretch for back and educated to perform prior to mobilizing due to stiffness in back worse after in bed long time    General Comments        Pertinent Vitals/Pain Pain Score: 5  Pain Location: back more than leg or arm due to lying in bed too much Pain Descriptors / Indicators: Aching Pain Intervention(s): Monitored during session;Repositioned    Home Living                      Prior Function            PT Goals (current goals can now be found in the care plan section) Progress towards PT goals: Progressing toward goals    Frequency  Min 3X/week    PT Plan Current plan remains appropriate    Co-evaluation  End of Session Equipment Utilized During Treatment: Gait belt Activity Tolerance: Patient tolerated treatment well Patient left: in chair;with call bell/phone within reach;with family/visitor present     Time: [id number] PT Time Calculation (min) (ACUTE ONLY): 28 min  Charges:  $Gait Training: 8-22 mins $Therapeutic Exercise: 8-22 mins                    G Codes:      Serenidy Waltz,CYNDI Sep 01, 2014, 3:18 PM  Magda Kiel, [place] 2014-09-01

## 2014-08-18 NOTE — Progress Notes (Signed)
Patient ID: Tracy [name], female   DOB: 09-02-1985, 29 y.o.   MRN: [id number]         [facility]    Date of Admission:  08/09/2014   Total days of antibiotics 9        Day 2 of cefazolin  Principal Problem:   Myositis Active Problems:   Proliferative diabetic retinopathy   Hypothyroidism   Normocytic anemia   Acute renal failure superimposed on stage 4 chronic kidney disease   Seizure   Sepsis   DM I (diabetes mellitus, type I), uncontrolled   Essential hypertension   . sodium chloride   Intravenous Once  . amLODipine  10 mg Oral Daily  .  ceFAZolin (ANCEF) IV  2 g Intravenous Q24H  . heparin  5,000 Units Subcutaneous 3 times per day  . insulin aspart  0-15 Units Subcutaneous TID WC  . insulin aspart  0-5 Units Subcutaneous QHS  . insulin aspart  2 Units Subcutaneous TID WC  . insulin glargine  15 Units Subcutaneous Daily  . levothyroxine  75 mcg Oral QAC breakfast  . metoprolol tartrate  50 mg Oral BID  . multivitamin with minerals  1 tablet Oral Daily  . senna-docusate  2 tablet Oral QHS  . sodium chloride  1,000 mL Intravenous Once  . sodium chloride  3 mL Intravenous Q12H    Subjective: Tracy [name] reports her arm pain is similar compared to yesterday and her right thigh is in minimal pain after her drainage by IR yesterday afternoon. She states she tolerated her cefazolin well yesterday as there was some concern of penicillin-induced urticaria. Her spirits were high after meeting with Dr. Amedeo Plenty who changed her bandages and gave her a good report.  Review of Systems: Pertinent items are noted in HPI.  Past Medical History  Diagnosis Date  . Thyroid enlargement     "not on medication at this time"  . Urinary tract infection     hx of  . Anemia     presently on iron supplement  . Diabetes mellitus     insulin pump   . Hypothyroidism   . Anxiety   . HSV-2 (herpes simplex virus 2) infection   . HSV-1 (herpes simplex virus 1)  infection   . Detached retina   . Yeast infection     took diflucan saturday   . Hypertension   . Irregular periods 07/05/2014  . Vaginal discharge 07/05/2014    History  Substance Use Topics  . Smoking status: Never Smoker   . Smokeless tobacco: Never Used  . Alcohol Use: No    Family History  Problem Relation Age of Onset  . Cancer Paternal Grandfather     prostate  . Hyperlipidemia Paternal Grandfather   . Stroke Paternal Grandfather    Allergies  Allergen Reactions  . Penicillins Hives and Swelling  . Sulfa Antibiotics Hives    OBJECTIVE: Blood pressure 138/74, pulse 89, temperature 98.3 F (36.8 C), temperature source Oral, resp. rate 18, height 5\' 3"  (1.6 m), weight 85.7 kg (188 lb 15 oz), last menstrual period 07/18/2014, SpO2 94 %. General: Lying in bed comfortably with bandage on right arm and thigh.  Lab Results Lab Results  Component Value Date   WBC 21.6* 08/18/2014   HGB 9.4* 08/18/2014   HCT 28.2* 08/18/2014   MCV 84.9 08/18/2014   PLT 477* 08/18/2014    Lab Results  Component Value Date   CREATININE 8.65* 08/16/2014  BUN 69* 08/16/2014   NA 132* 08/16/2014   K 4.6 08/16/2014   CL 104 08/16/2014   CO2 15* 08/16/2014    Lab Results  Component Value Date   ALT 18 08/10/2014   AST 38 08/10/2014   ALKPHOS 166* 08/10/2014   BILITOT 0.6 08/10/2014     Microbiology: Recent Results (from the past 240 hour(s))  Urine culture     Status: None   Collection Time: 08/09/14  1:57 PM  Result Value Ref Range Status   Specimen Description URINE, CLEAN CATCH  Final   Special Requests NONE  Final   Culture >=100,000 COLONIES/mL LACTOBACILLUS SPECIES  Final   Report Status 08/11/2014 FINAL  Final  Culture, blood (x 2)     Status: None   Collection Time: 08/09/14 11:42 PM  Result Value Ref Range Status   Specimen Description BLOOD LEFT HAND  Final   Special Requests BOTTLES DRAWN AEROBIC ONLY [place]  Final   Culture NO GROWTH 5 DAYS  Final   Report  Status 08/15/2014 FINAL  Final  Culture, blood (x 2)     Status: None   Collection Time: 08/09/14 11:50 PM  Result Value Ref Range Status   Specimen Description BLOOD RIGHT HAND  Final   Special Requests   Final    BOTTLES DRAWN AEROBIC AND ANAEROBIC [place] BLUE 5CC PURPLE   Culture NO GROWTH 5 DAYS  Final   Report Status 08/15/2014 FINAL  Final  Anaerobic culture     Status: None (Preliminary result)   Collection Time: 08/14/14  2:32 PM  Result Value Ref Range Status   Specimen Description FLUID  Final   Special Requests RIGHT ARM FLUID A  Final   Gram Stain   Final    MODERATE WBC PRESENT,BOTH PMN AND MONONUCLEAR NO SQUAMOUS EPITHELIAL CELLS SEEN RARE GRAM POSITIVE COCCI IN PAIRS IN CLUSTERS Performed at [facility]    Culture   Final    NO ANAEROBES ISOLATED; CULTURE IN PROGRESS FOR 5 DAYS Performed at [facility]    Report Status PENDING  Incomplete  Culture, routine-abscess     Status: None   Collection Time: 08/14/14  2:32 PM  Result Value Ref Range Status   Specimen Description ARM RIGHT  Final   Special Requests NONE  Final   Gram Stain   Final    FEW WBC PRESENT, PREDOMINANTLY PMN NO SQUAMOUS EPITHELIAL CELLS SEEN RARE GRAM POSITIVE COCCI IN CLUSTERS Performed at [facility]    Culture   Final    FEW STAPHYLOCOCCUS AUREUS Note: RIFAMPIN AND GENTAMICIN SHOULD NOT BE USED AS SINGLE DRUGS FOR TREATMENT OF STAPH INFECTIONS. Performed at [facility]    Report Status 08/17/2014 FINAL  Final   Organism ID, Bacteria STAPHYLOCOCCUS AUREUS  Final      Susceptibility   Staphylococcus aureus - MIC*    CLINDAMYCIN <=0.25 SENSITIVE Sensitive     ERYTHROMYCIN <=0.25 SENSITIVE Sensitive     GENTAMICIN <=0.5 SENSITIVE Sensitive     LEVOFLOXACIN 0.25 SENSITIVE Sensitive     OXACILLIN <=0.25 SENSITIVE Sensitive     PENICILLIN 0.06 SENSITIVE Sensitive     RIFAMPIN <=0.5 SENSITIVE Sensitive     TRIMETH/SULFA <=10 SENSITIVE Sensitive      VANCOMYCIN 1 SENSITIVE Sensitive     TETRACYCLINE <=1 SENSITIVE Sensitive     MOXIFLOXACIN <=0.25 SENSITIVE Sensitive     * FEW STAPHYLOCOCCUS AUREUS  Anaerobic culture     Status: None (Preliminary result)  Collection Time: 08/14/14  2:36 PM  Result Value Ref Range Status   Specimen Description TISSUE  Final   Special Requests RIGHT ARM B  Final   Gram Stain   Final    NO WBC SEEN NO SQUAMOUS EPITHELIAL CELLS SEEN RARE GRAM POSITIVE COCCI IN PAIRS IN CLUSTERS Performed at [facility]    Culture   Final    NO ANAEROBES ISOLATED; CULTURE IN PROGRESS FOR 5 DAYS Performed at [facility]    Report Status PENDING  Incomplete  Tissue culture     Status: None   Collection Time: 08/14/14  2:36 PM  Result Value Ref Range Status   Specimen Description TISSUE  Final   Special Requests RIGHT ARM B  Final   Gram Stain   Final    RARE WBC PRESENT, PREDOMINANTLY PMN NO SQUAMOUS EPITHELIAL CELLS SEEN RARE GRAM POSITIVE COCCI IN PAIRS Performed at [facility]    Culture   Final    MODERATE STAPHYLOCOCCUS AUREUS Note: RIFAMPIN AND GENTAMICIN SHOULD NOT BE USED AS SINGLE DRUGS FOR TREATMENT OF STAPH INFECTIONS. Performed at [facility]    Report Status 08/17/2014 FINAL  Final   Organism ID, Bacteria STAPHYLOCOCCUS AUREUS  Final      Susceptibility   Staphylococcus aureus - MIC*    CLINDAMYCIN <=0.25 SENSITIVE Sensitive     ERYTHROMYCIN <=0.25 SENSITIVE Sensitive     GENTAMICIN <=0.5 SENSITIVE Sensitive     LEVOFLOXACIN 0.25 SENSITIVE Sensitive     OXACILLIN <=0.25 SENSITIVE Sensitive     PENICILLIN 0.06 SENSITIVE Sensitive     RIFAMPIN <=0.5 SENSITIVE Sensitive     TRIMETH/SULFA <=10 SENSITIVE Sensitive     VANCOMYCIN 1 SENSITIVE Sensitive     TETRACYCLINE <=1 SENSITIVE Sensitive     MOXIFLOXACIN <=0.25 SENSITIVE Sensitive     * MODERATE STAPHYLOCOCCUS AUREUS    Assessment: Tracy [name] is a 29 year-old admitted for MSSA pyomyositis of the  right arm and is currently tolerating cefazolin well. The gram stain/cultures from her right thigh drainage from yesterday remain pending; we anticipate this is likely also MSSA so will continue cefazolin. Her WBC count jumped to 23 today and she also spiked a fever post-op which is not surprising given her drainage yesterday which likely dispersed toxins. We will continue to monitor these metrics but she is afebrile for now. Tracy [name] will require long-term antibiotic therapy; whether we deliver via PICC will be determined by her requirement for further dialysis as we do not want to impede future fistula sites. Thus we will follow with nephrology on their recommendations.  Plan: 1. Continue cefazolin 2. We will follow cultures of right thigh  Loleta Chance, MD [facility] for Infectious Disease [place] [facility] 08/18/2014, 1:08 PM   Addendum: I have seen and examined Tracy [name] and discussed her care with my resident, Dr. Melburn Hake. Tracy [name] is improving and feeling better. I recommend a total of 4 weeks of IV antibiotics therapy (19 more days of cefazolin).  Michel Bickers, MD [facility] for Infectious [place] [facility] [phone] pager   [phone] cell 08/18/2014, 2:27 PM

## 2014-08-19 DIAGNOSIS — B9561 Methicillin susceptible Staphylococcus aureus infection as the cause of diseases classified elsewhere: Secondary | ICD-10-CM

## 2014-08-19 DIAGNOSIS — M60000 Infective myositis, unspecified right arm: Secondary | ICD-10-CM

## 2014-08-19 DIAGNOSIS — M60051 Infective myositis, right thigh: Secondary | ICD-10-CM

## 2014-08-19 LAB — ANAEROBIC CULTURE: Gram Stain: NONE SEEN

## 2014-08-19 LAB — RENAL FUNCTION PANEL
ANION GAP: 11 (ref 5–15)
Albumin: 1.6 g/dL — ABNORMAL LOW (ref 3.5–5.0)
BUN: 35 mg/dL — ABNORMAL HIGH (ref 6–20)
CO2: 25 mmol/L (ref 22–32)
Calcium: 8 mg/dL — ABNORMAL LOW (ref 8.9–10.3)
Chloride: 99 mmol/L — ABNORMAL LOW (ref 101–111)
Creatinine, Ser: 5.29 mg/dL — ABNORMAL HIGH (ref 0.44–1.00)
GFR calc Af Amer: 12 mL/min — ABNORMAL LOW (ref >60)
GFR, EST NON AFRICAN AMERICAN: 10 mL/min — AB (ref >60)
GLUCOSE: 141 mg/dL — AB (ref 65–99)
PHOSPHORUS: 3.9 mg/dL (ref 2.5–4.6)
Potassium: 3.3 mmol/L — ABNORMAL LOW (ref 3.5–5.1)
Sodium: 135 mmol/L (ref 135–145)

## 2014-08-19 LAB — CBC
HCT: 27.1 % — ABNORMAL LOW (ref 36.0–46.0)
HEMOGLOBIN: 8.9 g/dL — AB (ref 12.0–15.0)
MCH: 27.9 pg (ref 26.0–34.0)
MCHC: 32.8 g/dL (ref 30.0–36.0)
MCV: 85 fL (ref 78.0–100.0)
Platelets: 449 10*3/uL — ABNORMAL HIGH (ref 150–400)
RBC: 3.19 MIL/uL — AB (ref 3.87–5.11)
RDW: 14.7 % (ref 11.5–15.5)
WBC: 19.9 10*3/uL — ABNORMAL HIGH (ref 4.0–10.5)

## 2014-08-19 LAB — GLUCOSE, CAPILLARY
GLUCOSE-CAPILLARY: 198 mg/dL — AB (ref 65–99)
GLUCOSE-CAPILLARY: 147 mg/dL — AB (ref 65–99)
GLUCOSE-CAPILLARY: 80 mg/dL (ref 65–99)
Glucose-Capillary: 128 mg/dL — ABNORMAL HIGH (ref 65–99)

## 2014-08-19 LAB — TSH: TSH: 15.859 u[IU]/mL — ABNORMAL HIGH (ref 0.350–4.500)

## 2014-08-19 NOTE — Progress Notes (Signed)
    Subjective:  Patient reports pain as increasing in the leg with increased tightness  Objective:   VITALS:   Filed Vitals:   08/18/14 1732 08/18/14 1900 08/19/14 0522 08/19/14 0846  BP: 132/70 149/78 149/70 126/86  Pulse: 86 84 91 85  Temp: 98.6 F (37 C) 98.9 F (37.2 C) 98.3 F (36.8 C) 98.8 F (37.1 C)  TempSrc: Oral Oral Oral Oral  Resp: 18 17 18 18   Height:      Weight:      SpO2: 95% 92% 90% 100%    Physical Exam Increased TTP and fullness in the R leg. Compartments soft  SILT DP/SP/S/S/T, 2+DP, +TA/GS/EHL  LABS  Results for orders placed or performed during the hospital encounter of 08/09/14 (from the past 24 hour(s))  Glucose, capillary     Status: Abnormal   Collection Time: 08/18/14  9:01 PM  Result Value Ref Range   Glucose-Capillary 129 (H) 65 - 99 mg/dL  Glucose, capillary     Status: Abnormal   Collection Time: 08/19/14  7:33 AM  Result Value Ref Range   Glucose-Capillary 198 (H) 65 - 99 mg/dL  Glucose, capillary     Status: Abnormal   Collection Time: 08/19/14 12:22 PM  Result Value Ref Range   Glucose-Capillary 147 (H) 65 - 99 mg/dL  CBC     Status: Abnormal   Collection Time: 08/19/14  2:36 PM  Result Value Ref Range   WBC 19.9 (H) 4.0 - 10.5 K/uL   RBC 3.19 (L) 3.87 - 5.11 MIL/uL   Hemoglobin 8.9 (L) 12.0 - 15.0 g/dL   HCT 27.1 (L) 36.0 - 46.0 %   MCV 85.0 78.0 - 100.0 fL   MCH 27.9 26.0 - 34.0 pg   MCHC 32.8 30.0 - 36.0 g/dL   RDW 14.7 11.5 - 15.5 %   Platelets 449 (H) 150 - 400 K/uL  Renal function panel     Status: Abnormal   Collection Time: 08/19/14  2:36 PM  Result Value Ref Range   Sodium 135 135 - 145 mmol/L   Potassium 3.3 (L) 3.5 - 5.1 mmol/L   Chloride 99 (L) 101 - 111 mmol/L   CO2 25 22 - 32 mmol/L   Glucose, Bld 141 (H) 65 - 99 mg/dL   BUN 35 (H) 6 - 20 mg/dL   Creatinine, Ser 5.29 (H) 0.44 - 1.00 mg/dL   Calcium 8.0 (L) 8.9 - 10.3 mg/dL   Phosphorus 3.9 2.5 - 4.6 mg/dL   Albumin 1.6 (L) 3.5 - 5.0 g/dL   GFR calc  non Af Amer 10 (L) >60 mL/min   GFR calc Af Amer 12 (L) >60 mL/min   Anion gap 11 5 - 15  TSH     Status: Abnormal   Collection Time: 08/19/14  2:36 PM  Result Value Ref Range   TSH 15.859 (H) 0.350 - 4.500 uIU/mL     Assessment/Plan: Principal Problem:   Myositis Active Problems:   Proliferative diabetic retinopathy   Hypothyroidism   Normocytic anemia   Acute renal failure superimposed on stage 4 chronic kidney disease   Seizure   Sepsis   DM I (diabetes mellitus, type I), uncontrolled   Essential hypertension I believe she has re-acumulated her fluid collection  PLAN: We will plan for Surgical I&D tomorrow morning   MURPHY, TIMOTHY D 08/19/2014, 4:48 PM   Edmonia Lynch, MD Cell [phone]

## 2014-08-19 NOTE — Progress Notes (Signed)
Patient ID: Tracy [name], female   DOB: [birth year], 29 y.o.   MRN: [id number]  [facility] Progress Note    Assessment/ Plan:   1. Acute renal failure on chronic kidney disease stage IV: Suspected to be hemodynamically mediated ATN and NSAIDs. Urine output marginalovernight at 650 mL . She is status post 2 dialysis treatments for uremic symptoms on Tuesday/Wednesday of this week with a plan to monitor her renal function/urine output over the next few days to see if indeed she has progressed to ESRD. 2. Pyomyositis right arm/right thigh-status post open biopsy of right arm and placement of JP drain by surgery- cultures with Staph - now on Clindamycin in addition to Vanco (levels high on labs yesterday). symptomatically better after percutaneous drainage of right thigh fluid collection-open I&D not warranted per surgery at this time. 4. Anemia: Secondary to infection/ESA resistance and some surgichemoglobin stable at this point, continue to monitor    Subjective:   Reports to be feeling well and denies any chest pain or shortness of breath     Objective:   BP 126/86 mmHg  Pulse 85  Temp(Src) 98.8 F (37.1 C) (Oral)  Resp 18  Ht 5\' 3"  (1.6 m)  Wt 87.3 kg (192 lb 7.4 oz)  BMI 34.10 kg/m2  SpO2 100%  LMP 07/18/2014  Intake/Output Summary (Last 24 hours) at 08/19/14 1014 Last data filed at 08/19/14 [id number]  Gross per 24 hour  Intake   1030 ml  Output    650 ml  Net    380 ml   Weight change:   Physical Exam: [id number] resting in recliner-grandmother at bedside CVS: Pulse RRR, normal s1 and s2 Resp:CTA bilaterally, no rales/rhonchi [id number], flat, NT, BS normal Ext: Trace LE edema with dressing/drain right arm  Imaging: US Aspiration  08/17/2014   CLINICAL DATA:  Acute infectious myositis  EXAM: ULTRASOUND RIGHT THIGH intramuscular ABSCESS NEEDLE ASPIRATION  Date:  7/6/20167/07/2014 3:41 pm  Radiologist:  M. Daryll Brod, MD  Guidance:  Ultrasound   FLUOROSCOPY TIME:  None.  MEDICATIONS AND MEDICAL HISTORY: 1.5 mg Versed, 50 mcg fentanyl  ANESTHESIA/SEDATION: 15 minutes  CONTRAST:  None  COMPLICATIONS: None  PROCEDURE: Informed consent was obtained from the patient following explanation of the procedure, risks, benefits and alternatives. The patient understands, agrees and consents for the procedure. All questions were addressed. A time out was performed.  Maximal barrier sterile technique utilized including caps, mask, sterile gowns, sterile gloves, large sterile drape, hand hygiene, and ChloraPrep.  Previous MRI was reviewed. Preliminary ultrasound performed of the right thigh. A heterogeneous complex appearing fluid collection is identified in the right vastus medialis muscle. This correlates with the MRI finding.  Under sterile conditions and local anesthesia, an 18 gauge access needle was advanced percutaneously under direct ultrasound into the intramuscular complex fluid collection. Syringe aspiration yielded 3 cc purulent fluid compatible with an intramuscular abscess. Sample sent for Gram stain and culture. Needle removed. No immediate complication.  IMPRESSION: Successful ultrasound aspiration of a right thigh vastus medialis intramuscular abscess. Gram stain and culture sent.   Electronically Signed   By: Jerilynn Mages.  Shick M.D.   On: 08/17/2014 15:50    Labs: BMET  Recent Labs Lab 08/13/14 0342 08/14/14 0846 08/16/14 0634 08/18/14 1508  NA 130* 132* 132* 136  K 4.8 4.3 4.6 3.6  CL 102 104 104 100*  CO2 12* 17* 15* 22  GLUCOSE 223* 103* 89 253*  BUN 54* 58* 69* 35*  CREATININE 6.37* 7.33*  8.65* 5.11*  CALCIUM 8.4* 8.7* 7.6* 8.2*  PHOS 6.4* 6.5*  --  3.7   CBC  Recent Labs Lab 08/13/14 0540 08/14/14 0846 08/16/14 0634 08/17/14 0530 08/18/14 0916  WBC 15.0* 18.4* 25.5* 20.8* 21.6*  NEUTROABS 12.6*  --   --   --   --   HGB 7.3* 8.2* 7.0* 6.9* 9.4*  HCT 22.3* 25.1* 20.9* 20.8* 28.2*  MCV 82.9 83.9 83.3 83.2 84.9  PLT 291 204 361  379 477*    Medications:    . sodium chloride   Intravenous Once  . amLODipine  10 mg Oral Daily  .  ceFAZolin (ANCEF) IV  2 g Intravenous Q24H  . heparin  5,000 Units Subcutaneous 3 times per day  . insulin aspart  0-15 Units Subcutaneous TID WC  . insulin aspart  0-5 Units Subcutaneous QHS  . insulin aspart  2 Units Subcutaneous TID WC  . insulin glargine  15 Units Subcutaneous Daily  . levothyroxine  75 mcg Oral QAC breakfast  . metoprolol tartrate  50 mg Oral BID  . multivitamin with minerals  1 tablet Oral Daily  . senna-docusate  2 tablet Oral QHS  . sodium chloride  1,000 mL Intravenous Once  . sodium chloride  3 mL Intravenous Q12H   Elmarie Shiley, MD 08/19/2014, 10:14 AM

## 2014-08-19 NOTE — Progress Notes (Signed)
     Subjective:  S/P R thigh aspiration on 08/17/2014. Patient reports pain as mild to moderate.  Resting comfortably in bed.  Husband at the bedside.  Feels about the same as yesterday.  Was able to get up and walk the halls and work on stairs with PT without too much difficulty.  Don't see any indication to take to surgery today.  Will hold off for now and see if she continues to do well today.  Objective:   VITALS:   Filed Vitals:   08/18/14 0535 08/18/14 1732 08/18/14 1900 08/19/14 0522  BP: 138/74 132/70 149/78 149/70  Pulse: 89 86 84 91  Temp: 98.3 F (36.8 C) 98.6 F (37 C) 98.9 F (37.2 C) 98.3 F (36.8 C)  TempSrc: Oral Oral Oral Oral  Resp: 18 18 17 18   Height:      Weight: 87.3 kg (192 lb 7.4 oz)     SpO2: 94% 95% 92% 90%    Neurologically intact ABD soft Neurovascular intact Sensation intact distally Intact pulses distally Dorsiflexion/Plantar flexion intact No erythema or warmth to the touch of the R thigh, much improved tenderness to palpation  Lab Results  Component Value Date   WBC 21.6* 08/18/2014   HGB 9.4* 08/18/2014   HCT 28.2* 08/18/2014   MCV 84.9 08/18/2014   PLT 477* 08/18/2014   BMET    Component Value Date/Time   NA 136 08/18/2014 1508   NA 133* 01/18/2013 1106   K 3.6 08/18/2014 1508   CL 100* 08/18/2014 1508   CO2 22 08/18/2014 1508   GLUCOSE 253* 08/18/2014 1508   GLUCOSE 929* 01/18/2013 1106   BUN 35* 08/18/2014 1508   BUN 37* 01/18/2013 1106   CREATININE 5.11* 08/18/2014 1508   CALCIUM 8.2* 08/18/2014 1508   GFRNONAA 10* 08/18/2014 1508   GFRAA 12* 08/18/2014 1508     Assessment/Plan: 5 Days Post-Op   Principal Problem:   Myositis Active Problems:   Proliferative diabetic retinopathy   Hypothyroidism   Normocytic anemia   Acute renal failure superimposed on stage 4 chronic kidney disease   Seizure   Sepsis   DM I (diabetes mellitus, type I), uncontrolled   Essential hypertension   Up with therapy WBAT in  RLE  We are hopeful that she will not require surgical wash out of the right thigh since pain is improving.  We will continue to monitor.   Tracy [name] [name] 08/19/2014, 6:33 AM Cell [phone]

## 2014-08-19 NOTE — Progress Notes (Signed)
Patient Demographics:    Tracy [name], is a 29 y.o. female, DOB - Jul 23, 1985, [id number]  Admit date - 08/09/2014   Admitting Physician Ivor Costa, MD  Outpatient Primary MD for the patient is [name]  LOS - 10   Chief Complaint  Patient presents with  . Arm Pain        Subjective:    Tracy [name] today has, No headache, No chest pain, No abdominal pain - No Nausea, No new weakness tingling or numbness, No Cough - SOB. Continues to have right arm and right thigh pain but overall feels much improved on a daily basis. She says now she is able to ambulate without feeling much pain in her legs.   Assessment  & Plan :     1. Myositis of the right arm and right thigh. Most likely post physical strain 2 weeks ago while throwing horse feed with some muscle injury with secondary infection. Case discussed with rheumatologist Dr. Amil Amen over the phone on the day of admission and then on 08/14/2014 she was also seen this admission by general surgery, hand surgery and orthopedics.   She is post right arm muscle biopsy, fluid drainage and JP drain placement on 08/14/2014 by hand surgery and right leg CT-guided drained by IR on 08/18/2014, so far cultures growing MSSA. ID on board now on cefazolin IV per ID. Source of bacteremia unclear, echocardiogram unremarkable, denies any IV drug use.  Discussed with Dr. Edmonia Lynch orthopedic he will follow closely he will think about taking the patient to the OR for a open washout depending on the clinical situation in the next few days. Clinically she is improving with pain better in both right upper and right lower extremity.     2. Hypothyroidism. Was not taking Synthroid for the last couple of months, counseled on compliance, TSH was 25, Synthroid  dose adjusted TSH is improving.    3. Anemia - iron deficiency on into anemia panel, IV and replaced. Transfuse 2 units of packed RBC on 08/17/2014 with dialysis. Outpatient workup for iron deficiency anemia in a menstruating female as appropriate.    4.ARF of CKD 4 - baseline creatinine of 3, had some nausea vomiting and NSAID use prior to admission, likely ATN. Avoid nephrotoxins, continue hydration, renal ultrasound shows cyst but no obstruction, urine output stable, Renal following. Started on dialysis on 08/16/2014 via right IJ dialysis catheter placed by IR 08/15/2014 could require permanent dialysis.    5. DM1 - poor outpatient control with A1c of 8.9 , on Lantus, sliding scale adjusted placed on pre-meal NovoLog, question dietary compliance with rapid fluctuation in sugars over the last 24 hours.  CBG (last 3)   Recent Labs  08/18/14 1642 08/18/14 2101 08/19/14 0733  GLUCAP 181* 129* 198*    Lab Results  Component Value Date   HGBA1C 8.9* 08/10/2014     6. UA appears clean with 0-2 WBCs ruling out UTI, cultures growing lactobacilli which likely is contamination from vaginal flora. Monitor clinically.     Code Status : Full  Family Communication  : Mother bedside, boyfriend bedside  Disposition Plan  : Home   Consults  :  Orthopedics, hand surgery, general surgery, Renal, rheumatologist Dr. Amil Amen over the  phone 2, ID, IR  Procedures  :   R Arm - Muscle biopsy, fluid drainage and JP drain placement 08-14-14 by Dr Amedeo Plenty.  Right upper extremity venous duplex. No DVT, MRI right arm and right leg x 2, CT right thigh  Right IJ dialysis catheter placed by IR on 08/15/2014  Started on dialysis 08/16/2014  R Leg US guided Aspiration by IR on 08-17-14   TTE  - Left ventricle: The cavity size was normal. Wall thickness wasincreased in a pattern of mild LVH. Systolic function wasvigorous. The estimated ejection fraction was in the range of 65%to 70%. Wall  motion was normal; there were no regional wallmotion abnormalities. Left ventricular diastolic functionparameters were normal.  - Mitral valve: There was mild regurgitation.  - Pericardium, extracardiac: A trivial pericardial effusion wasidentified. Features were not consistent with tamponadephysiology.   DVT Prophylaxis  :   Heparin    Lab Results  Component Value Date   PLT 477* 08/18/2014    Inpatient Medications  Scheduled Meds: . sodium chloride   Intravenous Once  . amLODipine  10 mg Oral Daily  .  ceFAZolin (ANCEF) IV  2 g Intravenous Q24H  . heparin  5,000 Units Subcutaneous 3 times per day  . insulin aspart  0-15 Units Subcutaneous TID WC  . insulin aspart  0-5 Units Subcutaneous QHS  . insulin aspart  2 Units Subcutaneous TID WC  . insulin glargine  15 Units Subcutaneous Daily  . levothyroxine  75 mcg Oral QAC breakfast  . metoprolol tartrate  50 mg Oral BID  . multivitamin with minerals  1 tablet Oral Daily  . senna-docusate  2 tablet Oral QHS  . sodium chloride  1,000 mL Intravenous Once  . sodium chloride  3 mL Intravenous Q12H   Continuous Infusions:   PRN Meds:.acetaminophen **OR** acetaminophen, alum & mag hydroxide-simeth, diphenhydrAMINE, hydrALAZINE, HYDROcodone-acetaminophen, HYDROmorphone (DILAUDID) injection, ondansetron, polyethylene glycol, zolpidem  Antibiotics  :     Anti-infectives    Start     Dose/Rate Route Frequency Ordered Stop   08/17/14 1200  ceFAZolin (ANCEF) IVPB 2 g/50 mL premix     2 g 100 mL/hr over 30 Minutes Intravenous Every 24 hours 08/17/14 1003     08/15/14 1600  clindamycin (CLEOCIN) IVPB 900 mg  Status:  Discontinued     900 mg 100 mL/hr over 30 Minutes Intravenous Every 8 hours 08/15/14 1529 08/16/14 1559   08/15/14 1530  clindamycin (CLEOCIN) IVPB 300 mg  Status:  Discontinued     300 mg 100 mL/hr over 30 Minutes Intravenous 3 times per day 08/15/14 1522 08/15/14 1531   08/15/14 1430  aztreonam (AZACTAM) 500 mg in  dextrose 5 % 50 mL IVPB  Status:  Discontinued     500 mg 100 mL/hr over 30 Minutes Intravenous Every 8 hours 08/15/14 1118 08/15/14 1522   08/11/14 2200  vancomycin (VANCOCIN) IVPB 1000 mg/200 mL premix  Status:  Discontinued     1,000 mg 200 mL/hr over 60 Minutes Intravenous Every 48 hours 08/09/14 2256 08/16/14 0716   08/10/14 1045  vancomycin (VANCOCIN) IVPB 750 mg/150 ml premix  Status:  Discontinued     750 mg 150 mL/hr over 60 Minutes Intravenous Every 12 hours 08/09/14 2252 08/09/14 2256   08/09/14 2230  aztreonam (AZACTAM) 1 g in dextrose 5 % 50 mL IVPB  Status:  Discontinued     1 g 100 mL/hr over 30 Minutes Intravenous Every 8 hours 08/09/14 2215 08/15/14 1118   08/09/14  2200  vancomycin (VANCOCIN) IVPB 1000 mg/200 mL premix  Status:  Discontinued     1,000 mg 200 mL/hr over 60 Minutes Intravenous Every 48 hours 08/09/14 2159 08/09/14 2252   08/09/14 2145  vancomycin (VANCOCIN) IVPB 1000 mg/200 mL premix  Status:  Discontinued     1,000 mg 200 mL/hr over 60 Minutes Intravenous  Once 08/09/14 2137 08/09/14 2147        Objective:   Filed Vitals:   08/18/14 1732 08/18/14 1900 08/19/14 0522 08/19/14 0846  BP: 132/70 149/78 149/70 126/86  Pulse: 86 84 91 85  Temp: 98.6 F (37 C) 98.9 F (37.2 C) 98.3 F (36.8 C) 98.8 F (37.1 C)  TempSrc: Oral Oral Oral Oral  Resp: 18 17 18 18   Height:      Weight:      SpO2: 95% 92% 90% 100%    Wt Readings from Last 3 Encounters:  08/18/14 87.3 kg (192 lb 7.4 oz)  08/08/14 68.493 kg (151 lb)  08/05/14 68.493 kg (151 lb)     Intake/Output Summary (Last 24 hours) at 08/19/14 1139 Last data filed at 08/19/14 0917  Gross per 24 hour  Intake    950 ml  Output    650 ml  Net    300 ml     Physical Exam  Awake Alert, Oriented X 3, No new F.N deficits, Normal affect [place].AT,PERRAL Supple Neck,No JVD, No cervical lymphadenopathy appriciated.  Symmetrical Chest wall movement, Good air movement bilaterally, CTAB RRR,No  Gallops,Rubs or new Murmurs, No Parasternal Heave +ve B.Sounds, Abd Soft, No tenderness, No organomegaly appriciated, No rebound - guarding or rigidity. No Cyanosis, Clubbing or edema, No new Rash or bruise  Swelling in the right arm and right thigh, some tenderness, no warmth, no signs of compartment syndrome    Data Review:   Micro Results Recent Results (from the past 240 hour(s))  Urine culture     Status: None   Collection Time: 08/09/14  1:57 PM  Result Value Ref Range Status   Specimen Description URINE, CLEAN CATCH  Final   Special Requests NONE  Final   Culture >=100,000 COLONIES/mL LACTOBACILLUS SPECIES  Final   Report Status 08/11/2014 FINAL  Final  Culture, blood (x 2)     Status: None   Collection Time: 08/09/14 11:42 PM  Result Value Ref Range Status   Specimen Description BLOOD LEFT HAND  Final   Special Requests BOTTLES DRAWN AEROBIC ONLY [place]  Final   Culture NO GROWTH 5 DAYS  Final   Report Status 08/15/2014 FINAL  Final  Culture, blood (x 2)     Status: None   Collection Time: 08/09/14 11:50 PM  Result Value Ref Range Status   Specimen Description BLOOD RIGHT HAND  Final   Special Requests   Final    BOTTLES DRAWN AEROBIC AND ANAEROBIC [place] BLUE 5CC PURPLE   Culture NO GROWTH 5 DAYS  Final   Report Status 08/15/2014 FINAL  Final  Anaerobic culture     Status: None   Collection Time: 08/14/14  2:32 PM  Result Value Ref Range Status   Specimen Description FLUID  Final   Special Requests RIGHT ARM FLUID A  Final   Gram Stain   Final    MODERATE WBC PRESENT,BOTH PMN AND MONONUCLEAR NO SQUAMOUS EPITHELIAL CELLS SEEN RARE GRAM POSITIVE COCCI IN PAIRS IN CLUSTERS Performed at [facility]    Culture   Final    NO ANAEROBES ISOLATED Performed at  [facility]    Report Status 08/19/2014 FINAL  Final  Culture, routine-abscess     Status: None   Collection Time: 08/14/14  2:32 PM  Result Value Ref Range Status   Specimen Description ARM RIGHT   Final   Special Requests NONE  Final   Gram Stain   Final    FEW WBC PRESENT, PREDOMINANTLY PMN NO SQUAMOUS EPITHELIAL CELLS SEEN RARE GRAM POSITIVE COCCI IN CLUSTERS Performed at [facility]    Culture   Final    FEW STAPHYLOCOCCUS AUREUS Note: RIFAMPIN AND GENTAMICIN SHOULD NOT BE USED AS SINGLE DRUGS FOR TREATMENT OF STAPH INFECTIONS. Performed at [facility]    Report Status 08/17/2014 FINAL  Final   Organism ID, Bacteria STAPHYLOCOCCUS AUREUS  Final      Susceptibility   Staphylococcus aureus - MIC*    CLINDAMYCIN <=0.25 SENSITIVE Sensitive     ERYTHROMYCIN <=0.25 SENSITIVE Sensitive     GENTAMICIN <=0.5 SENSITIVE Sensitive     LEVOFLOXACIN 0.25 SENSITIVE Sensitive     OXACILLIN <=0.25 SENSITIVE Sensitive     PENICILLIN 0.06 SENSITIVE Sensitive     RIFAMPIN <=0.5 SENSITIVE Sensitive     TRIMETH/SULFA <=10 SENSITIVE Sensitive     VANCOMYCIN 1 SENSITIVE Sensitive     TETRACYCLINE <=1 SENSITIVE Sensitive     MOXIFLOXACIN <=0.25 SENSITIVE Sensitive     * FEW STAPHYLOCOCCUS AUREUS  Anaerobic culture     Status: None   Collection Time: 08/14/14  2:36 PM  Result Value Ref Range Status   Specimen Description TISSUE  Final   Special Requests RIGHT ARM B  Final   Gram Stain   Final    NO WBC SEEN NO SQUAMOUS EPITHELIAL CELLS SEEN RARE GRAM POSITIVE COCCI IN PAIRS IN CLUSTERS Performed at [facility]    Culture   Final    NO ANAEROBES ISOLATED Performed at [facility]    Report Status 08/19/2014 FINAL  Final  Tissue culture     Status: None   Collection Time: 08/14/14  2:36 PM  Result Value Ref Range Status   Specimen Description TISSUE  Final   Special Requests RIGHT ARM B  Final   Gram Stain   Final    RARE WBC PRESENT, PREDOMINANTLY PMN NO SQUAMOUS EPITHELIAL CELLS SEEN RARE GRAM POSITIVE COCCI IN PAIRS Performed at [facility]    Culture   Final    MODERATE STAPHYLOCOCCUS AUREUS Note: RIFAMPIN AND GENTAMICIN  SHOULD NOT BE USED AS SINGLE DRUGS FOR TREATMENT OF STAPH INFECTIONS. Performed at [facility]    Report Status 08/17/2014 FINAL  Final   Organism ID, Bacteria STAPHYLOCOCCUS AUREUS  Final      Susceptibility   Staphylococcus aureus - MIC*    CLINDAMYCIN <=0.25 SENSITIVE Sensitive     ERYTHROMYCIN <=0.25 SENSITIVE Sensitive     GENTAMICIN <=0.5 SENSITIVE Sensitive     LEVOFLOXACIN 0.25 SENSITIVE Sensitive     OXACILLIN <=0.25 SENSITIVE Sensitive     PENICILLIN 0.06 SENSITIVE Sensitive     RIFAMPIN <=0.5 SENSITIVE Sensitive     TRIMETH/SULFA <=10 SENSITIVE Sensitive     VANCOMYCIN 1 SENSITIVE Sensitive     TETRACYCLINE <=1 SENSITIVE Sensitive     MOXIFLOXACIN <=0.25 SENSITIVE Sensitive     * MODERATE STAPHYLOCOCCUS AUREUS  Culture, routine-abscess     Status: None (Preliminary result)   Collection Time: 08/17/14  3:38 PM  Result Value Ref Range Status   Specimen Description ABSCESS RIGHT THIGH  Final   Special Requests Normal  Final   Gram Stain   Final    FEW WBC PRESENT, PREDOMINANTLY MONONUCLEAR NO SQUAMOUS EPITHELIAL CELLS SEEN MODERATE GRAM POSITIVE COCCI IN PAIRS IN CLUSTERS Performed at [facility]    Culture PENDING  Incomplete   Report Status PENDING  Incomplete  Anaerobic culture     Status: None (Preliminary result)   Collection Time: 08/17/14  3:38 PM  Result Value Ref Range Status   Specimen Description ABSCESS RIGHT THIGH  Final   Special Requests Normal  Final   Gram Stain   Final    FEW WBC PRESENT, PREDOMINANTLY MONONUCLEAR NO SQUAMOUS EPITHELIAL CELLS SEEN MODERATE GRAM POSITIVE COCCI IN PAIRS IN CLUSTERS Performed at [facility]    Culture PENDING  Incomplete   Report Status PENDING  Incomplete    Radiology Reports Dg Elbow Complete Right  08/05/2014   CLINICAL DATA:  Right elbow pain that radiates to the shoulder. Pain started after picking up horse feed.  EXAM: RIGHT ELBOW - COMPLETE 3+ VIEW  COMPARISON:  None.   FINDINGS: There is no evidence of fracture, dislocation, or joint effusion. There is no evidence of arthropathy or other focal bone abnormality. Soft tissues are unremarkable.  IMPRESSION: Negative.   Electronically Signed   By: Markus Daft M.D.   On: 08/05/2014 08:05   Mr Humerus Right Wo Contrast  08/12/2014   CLINICAL DATA:  Myositis in the right arm.  Injury 2 weeks ago.  EXAM: MRI OF THE RIGHT HUMERUS WITHOUT CONTRAST  TECHNIQUE: Multiplanar, multisequence MR imaging was performed. No intravenous contrast was administered.  COMPARISON:  08/10/2014  FINDINGS: Dependent atelectasis in the right lower lobe.  There is considerable abnormal edema in the lateral, long, and medial head of the triceps diffusely. The internal tendon of the medial head seems torn on images 20 through 23 of series 7, with an associated 2.2 by 0.6 by 1.4 cm fluid collection along the proximal margin of the tendon.  There is edema in the coracobrachialis muscle tendon a small medial portion of the brachialis muscle.  Left axillary lymph nodes measure up to 1.2 cm in short axis. Edema tracks along the axillary neurovascular structures.  Fluid signal tracks superficial to the posterior deltoid muscle and superficial to the latissimus dorsi and infraspinatus and teres minor muscles.  Subcutaneous edema tracks in the upper arm circumferentially, somewhat more confluent anteriorly.  Small elbow joint effusion.  No significant abnormal osseous edema.  IMPRESSION: 1. Again observed is extensive myositis primarily involving the triceps and coracobrachialis muscles, with a small intrasubstance fluid collection along the proximal tendinous margin of the medial head of the triceps which may represent musculotendinous tear or less likely a very small abscess. This collection is similar to prior. Possibilities include posttraumatic myositis, infection, or a vascular etiology. Mild reactive axillary adenopathy on the right may suggest an infectious  component. 2. Subcutaneous edema in the upper arm, mildly increased. Cellulitis not excluded. 3. Edema tracks along the superficial margin of the deltoid muscle, teres minor, and infraspinatus as well as the latissimus dorsi. This may be incidental but shear injury cannot be excluded.   Electronically Signed   By: Van Clines M.D.   On: 08/12/2014 08:46   Mr Humerus Right Wo Contrast  08/10/2014   CLINICAL DATA:  Right arm pain after an injury throwing a bag of horse feed 2 weeks ago.  EXAM: MRI OF THE RIGHT HUMERUS WITHOUT CONTRAST  TECHNIQUE: Multiplanar,  multisequence MR imaging was performed. No intravenous contrast was administered.  COMPARISON:  MRI dated 08/08/2014 and radiograph dated 05/05/2014  FINDINGS: The edema in the right triceps muscle is even more extensive than on the prior study. The muscle is larger than on the prior study. There is only slight sparing of the proximal aspect of the muscle. Subcutaneous edema is more prominent. The underlying humerus and the neurovascular bundle are normal. The focal area of abnormal signal at the distal mild tendinous junction is unchanged.  IMPRESSION: Progressive edema of the triceps muscle consistent with myositis. Focal area of abnormal signal at the mild tendinous junction could represent a tear or small abscess but this is not progressed. Given the patient's elevated white blood count, infectious myositis must be considered.   Electronically Signed   By: Lorriane Shire M.D.   On: 08/10/2014 08:50   Mr Humerus Right Wo Contrast  08/09/2014   CLINICAL DATA:  Pain throughout entire right humerus for 2 weeks.  EXAM: MRI OF THE RIGHT HUMERUS WITHOUT CONTRAST  TECHNIQUE: Multiplanar, multisequence MR imaging was performed. No intravenous contrast was administered.  COMPARISON:  None.  FINDINGS: No marrow signal abnormality. No fracture or dislocation. There is no bone destruction or periosteal reaction.  There is severe edema within the right triceps  muscle with small areas of sparing proximally. At the distal musculotendinous junction of the medial head of the triceps, there is a complex 2 x 2 x 2.3 cm T2 signal abnormality with central T2 hyperintensity and peripheral intermediate signal. There is mild edema in the adjacent subcutaneous fat.  The biceps musculature is normal. There is no other fluid collection or soft tissue mass. The neurovascular bundles are grossly normal. There are multiple prominent right axillary lymph nodes.  IMPRESSION: 1. Severe edema within the right triceps muscle with small areas of sparing proximally. At the distal musculotendinous junction of the medial head of the triceps, there is a complex 2 x 2 x 2.3 cm T2 signal abnormality. The overall appearance is concerning for myositis which may be secondary to an infectious or inflammatory etiology. The 2 cm area of focal abnormal signal which may reflect a hematoma secondary to a partial tear given the location is at the musculotendinous junction of the medial head of the triceps, but given the extensiveness of the muscle edema, this may reflect a developing abscess versus myonecrosis.   Electronically Signed   By: Kathreen Devoid   On: 08/09/2014 08:31   US Renal  08/10/2014   CLINICAL DATA:  Acute kidney injury.  Elevated creatinine level.  EXAM: RENAL / URINARY TRACT ULTRASOUND COMPLETE  COMPARISON:  CT examination 03/09/2013 and ultrasound 10/04/2008  FINDINGS: Right Kidney:  Length: 10.6 cm. The right kidney parenchyma is echogenic without hydronephrosis. There is a heterogeneous hypoechoic structure along the upper pole measuring up to 1.1 cm. Previously, the patient had an abscess in the right kidney and unclear if this sequela from the abscess or a new small complex lesion.  Left Kidney:  Length: 10.6 cm. Left kidney is not well visualized but appears to be echogenic. Negative for hydronephrosis.  Bladder:  Appears normal for degree of bladder distention.  IMPRESSION: Negative  for hydronephrosis.  Increased echogenicity of both kidneys. Findings raise concern for chronic medical renal disease.  There is a 1.1 cm heterogeneous hypoechoic structure in the right kidney upper pole. Unclear if this is the sequelae of previous renal abscess versus new small complex cystic lesion. Evaluation of this  area will be difficult due to acute kidney injury and an elevated creatinine level. However, this may be better characterized with a non contrast MRI versus close surveillance with ultrasound.   Electronically Signed   By: Markus Daft M.D.   On: 08/10/2014 11:34   Ct Femur Right Wo Contrast  08/10/2014   CLINICAL DATA:  Myositis. Sudden onset of pain while lifting a feed bag 2 weeks ago.  EXAM: CT OF THE LOWER RIGHT EXTREMITY WITHOUT CONTRAST  TECHNIQUE: Multidetector CT imaging of the right femur and right tibia fibula was performed according to the standard protocol.  COMPARISON:  None.  FINDINGS: There is no bony abnormality. There is no radiopaque foreign body. There is no soft tissue mass. There is no abscess or drainable fluid collection. There is no soft tissue gas.  There is soft tissue edema around the muscles of the right lower extremity, particularly around the adductor of the via and the quadriceps musculature. This is consistent with the clinically described suspicion of myositis. There is a small knee joint effusion.  IMPRESSION: Soft tissue edema surrounding the musculature of the right lower extremity consistent with myositis. No mass or drainable fluid collection.   Electronically Signed   By: Andreas Newport M.D.   On: 08/10/2014 03:49   Ct Tibia Fibula Right Wo Contrast  08/10/2014   CLINICAL DATA:  Myositis. Sudden onset of pain while lifting a feed bag 2 weeks ago.  EXAM: CT OF THE LOWER RIGHT EXTREMITY WITHOUT CONTRAST  TECHNIQUE: Multidetector CT imaging of the right femur and right tibia fibula was performed according to the standard protocol.  COMPARISON:  None.   FINDINGS: There is no bony abnormality. There is no radiopaque foreign body. There is no soft tissue mass. There is no abscess or drainable fluid collection. There is no soft tissue gas.  There is soft tissue edema around the muscles of the right lower extremity, particularly around the adductor of the via and the quadriceps musculature. This is consistent with the clinically described suspicion of myositis. There is a small knee joint effusion.  IMPRESSION: Soft tissue edema surrounding the musculature of the right lower extremity consistent with myositis. No mass or drainable fluid collection.   Electronically Signed   By: Andreas Newport M.D.   On: 08/10/2014 03:49   Mr Femur Right Wo Contrast  08/12/2014   CLINICAL DATA:  Myositis of the right arm and right thigh. Status post physical strain 2 weeks ago.  EXAM: MRI OF THE RIGHT FEMUR WITHOUT CONTRAST  TECHNIQUE: Multiplanar, multisequence MR imaging of the right femur was performed. No intravenous contrast was administered.  COMPARISON:  CT right femur 08/10/2014  FINDINGS: There is severe muscle edema within the vastus medialis and intermedius muscle. There is a 14 mm more focal area of fluid signal within the vastus medialis which may reflect a more focal edema versus a small fluid collection. There is mild muscle edema within the vastus lateralis and rectus femoris. There is severe muscle edema within the sartorius muscle. There is a small amount of fluid along the fascia of the flexor compartment. There is small amount of fluid along the fascia of the flexor compartment and around the adductor compartment. There is no drainable focal fluid collection. There is no hematoma.  There is no focal marrow signal abnormality. There is no fracture or subluxation. No periosteal reaction.  IMPRESSION: 1. Severe muscle edema within the vastus medialis, vastus intermedius and sartorius muscles most consistent with myositis secondary  to an infectious or inflammatory  etiology. 14 mm more focal area of fluid signal within the vastus medialis which may reflect a more focal edema versus a small fluid collection. Low level edema within the vastus lateralis and rectus femoris likely reflecting myositis to a much lesser degree.   Electronically Signed   By: Kathreen Devoid   On: 08/12/2014 08:57   US Aspiration  08/17/2014   CLINICAL DATA:  Acute infectious myositis  EXAM: ULTRASOUND RIGHT THIGH intramuscular ABSCESS NEEDLE ASPIRATION  Date:  7/6/20167/07/2014 3:41 pm  Radiologist:  M. Daryll Brod, MD  Guidance:  Ultrasound  FLUOROSCOPY TIME:  None.  MEDICATIONS AND MEDICAL HISTORY: 1.5 mg Versed, 50 mcg fentanyl  ANESTHESIA/SEDATION: 15 minutes  CONTRAST:  None  COMPLICATIONS: None  PROCEDURE: Informed consent was obtained from the patient following explanation of the procedure, risks, benefits and alternatives. The patient understands, agrees and consents for the procedure. All questions were addressed. A time out was performed.  Maximal barrier sterile technique utilized including caps, mask, sterile gowns, sterile gloves, large sterile drape, hand hygiene, and ChloraPrep.  Previous MRI was reviewed. Preliminary ultrasound performed of the right thigh. A heterogeneous complex appearing fluid collection is identified in the right vastus medialis muscle. This correlates with the MRI finding.  Under sterile conditions and local anesthesia, an 18 gauge access needle was advanced percutaneously under direct ultrasound into the intramuscular complex fluid collection. Syringe aspiration yielded 3 cc purulent fluid compatible with an intramuscular abscess. Sample sent for Gram stain and culture. Needle removed. No immediate complication.  IMPRESSION: Successful ultrasound aspiration of a right thigh vastus medialis intramuscular abscess. Gram stain and culture sent.   Electronically Signed   By: Jerilynn Mages.  Shick M.D.   On: 08/17/2014 15:50   Ir Fluoro Guide Cv Line Right  08/15/2014   CLINICAL  DATA:  Diabetes, acute renal insufficiency  EXAM: EXAM RIGHT IJ CATHETER PLACEMENT UNDER ULTRASOUND AND FLUOROSCOPIC GUIDANCE  TECHNIQUE: The procedure, risks (including but not limited to bleeding, infection, organ damage, pneumothorax), benefits, and alternatives were explained to the patient. Questions regarding the procedure were encouraged and answered. The patient understands and consents to the procedure. Patency of the right IJ vein was confirmed with ultrasound with image documentation. An appropriate skin site was determined. Skin site was marked. Region was prepped using maximum barrier technique including cap and mask, sterile gown, sterile gloves, large sterile sheet, and Chlorhexidine as cutaneous antisepsis. The region was infiltrated locally with 1% lidocaine. Under real-time ultrasound guidance, the right IJ vein was accessed with a 19 gauge needle; the needle tip within the vein was confirmed with ultrasound image documentation. The needle exchanged over a guidewire for vascular dilator which allowed advancement of a 20 cm Trialysis catheter. This was positioned with the tip at the cavoatrial junction. Spot chest radiograph shows good positioning and no pneumothorax. Catheter was flushed and sutured externally with 0-Prolene sutures. Patient tolerated the procedure well.  FLUOROSCOPY TIME:  1.1 mGy, 6 seconds  COMPLICATIONS: COMPLICATIONS none  IMPRESSION: 1. Technically successful right IJ Trialysis catheter placement.   Electronically Signed   By: Lucrezia Europe M.D.   On: 08/15/2014 11:00   Ir US Guide Vasc Access Right  08/15/2014   CLINICAL DATA:  Diabetes, acute renal insufficiency  EXAM: EXAM RIGHT IJ CATHETER PLACEMENT UNDER ULTRASOUND AND FLUOROSCOPIC GUIDANCE  TECHNIQUE: The procedure, risks (including but not limited to bleeding, infection, organ damage, pneumothorax), benefits, and alternatives were explained to the patient. Questions regarding the procedure  were encouraged and answered.  The patient understands and consents to the procedure. Patency of the right IJ vein was confirmed with ultrasound with image documentation. An appropriate skin site was determined. Skin site was marked. Region was prepped using maximum barrier technique including cap and mask, sterile gown, sterile gloves, large sterile sheet, and Chlorhexidine as cutaneous antisepsis. The region was infiltrated locally with 1% lidocaine. Under real-time ultrasound guidance, the right IJ vein was accessed with a 19 gauge needle; the needle tip within the vein was confirmed with ultrasound image documentation. The needle exchanged over a guidewire for vascular dilator which allowed advancement of a 20 cm Trialysis catheter. This was positioned with the tip at the cavoatrial junction. Spot chest radiograph shows good positioning and no pneumothorax. Catheter was flushed and sutured externally with 0-Prolene sutures. Patient tolerated the procedure well.  FLUOROSCOPY TIME:  1.1 mGy, 6 seconds  COMPLICATIONS: COMPLICATIONS none  IMPRESSION: 1. Technically successful right IJ Trialysis catheter placement.   Electronically Signed   By: Lucrezia Europe M.D.   On: 08/15/2014 11:00   Dg Femur, Min 2 Views Right  08/10/2014   CLINICAL DATA:  Right thigh pain. Concern for myositis. Rule out soft tissue air.  EXAM: RIGHT FEMUR 2 VIEWS  COMPARISON:  None.  FINDINGS: Cortical margins of the right femur are intact. No fracture, focal osseous lesion, erosion or periosteal reaction. No soft tissue air or radiopaque foreign body. Questionable soft tissue edema proximally.  IMPRESSION: Suspect mild soft tissue edema proximally. No soft tissue air, radiopaque foreign body, or acute osseous abnormality.   Electronically Signed   By: Jeb Levering M.D.   On: 08/10/2014 01:48     CBC  Recent Labs Lab 08/13/14 0540 08/14/14 0846 08/16/14 0634 08/17/14 0530 08/18/14 0916  WBC 15.0* 18.4* 25.5* 20.8* 21.6*  HGB 7.3* 8.2* 7.0* 6.9* 9.4*  HCT  22.3* 25.1* 20.9* 20.8* 28.2*  PLT 291 204 361 379 477*  MCV 82.9 83.9 83.3 83.2 84.9  MCH 27.1 27.4 27.9 27.6 28.3  MCHC 32.7 32.7 33.5 33.2 33.3  RDW 14.0 14.5 14.4 14.2 14.5  LYMPHSABS 0.9  --   --   --   --   MONOABS 1.4*  --   --   --   --   EOSABS 0.1  --   --   --   --   BASOSABS 0.1  --   --   --   --     Chemistries   Recent Labs Lab 08/13/14 0342 08/14/14 0846 08/16/14 0634 08/18/14 1508  NA 130* 132* 132* 136  K 4.8 4.3 4.6 3.6  CL 102 104 104 100*  CO2 12* 17* 15* 22  GLUCOSE 223* 103* 89 253*  BUN 54* 58* 69* 35*  CREATININE 6.37* 7.33* 8.65* 5.11*  CALCIUM 8.4* 8.7* 7.6* 8.2*   ------------------------------------------------------------------------------------------------------------------ estimated creatinine clearance is 16.9 mL/min (by C-G formula based on Cr of 5.11). ------------------------------------------------------------------------------------------------------------------ No results for input(s): HGBA1C in the last 72 hours. ------------------------------------------------------------------------------------------------------------------ No results for input(s): CHOL, HDL, LDLCALC, TRIG, CHOLHDL, LDLDIRECT in the last 72 hours. ------------------------------------------------------------------------------------------------------------------  Recent Labs  08/17/14 0530  TSH 7.256*   ------------------------------------------------------------------------------------------------------------------ No results for input(s): VITAMINB12, FOLATE, FERRITIN, TIBC, IRON, RETICCTPCT in the last 72 hours.  Coagulation profile  Recent Labs Lab 08/16/14 0635  INR 1.40    No results for input(s): DDIMER in the last 72 hours.  Cardiac Enzymes No results for input(s): CKMB, TROPONINI, MYOGLOBIN in the last 168 hours.  Invalid input(s):  CK ------------------------------------------------------------------------------------------------------------------ Invalid input(s): POCBNP   Time Spent in minutes  35   Lavar Rosenzweig K M.D on 08/19/2014 at 11:39 AM  Between 7am to 7pm - Pager - [phone]  After 7pm go to [email or web address] - password [facility]  [name]   Office  [phone]

## 2014-08-19 NOTE — Progress Notes (Signed)
Occupational Therapy Treatment Patient Details Name: Tracy [name] MRN: [id number] DOB: 1985-07-18 Today's Date: 08/19/2014    History of present illness Myositis of the right arm and right thigh. Most likely post physical strain 2 weeks ago while throwing horse feed. s/p I&D and biopsies R thigh and UE + for MSSA. Pt with renal failure, on temporary HD. PMH: uncontrolled DM, hypothyroidism.   OT comments  Pt demonstrating ability to perform toileting, standing grooming, and LB dressing at a modified independent level. She requested to walk in halls, pushed IV pole and provided supervision for safety.  Pt is routinely elevating R UE and LEs and performing ROM exercises.  Follow Up Recommendations  No OT follow up (follow up for R UE therapy as MD deems appropriate)    Equipment Recommendations  None recommended by OT    Recommendations for Other Services      Precautions / Restrictions Precautions Precautions: None       Mobility Bed Mobility               General bed mobility comments: patient in recliner  Transfers Overall transfer level: Needs assistance   Transfers: Sit to/from Stand Sit to Stand: Modified independent (Device/Increase time)              Balance                                   ADL       Grooming: Wash/dry hands;Brushing hair;Standing;Modified independent           Upper Body Dressing : Standing;Minimal assistance Upper Body Dressing Details (indicate cue type and reason): for front opening gown Lower Body Dressing: Modified independent;Sit to/from stand Lower Body Dressing Details (indicate cue type and reason): wearing flip flops, able to donn panties, starting over R foot  Toilet Transfer: Modified Independent;Ambulation;Regular Glass blower/designer Details (indicate cue type and reason): pt is routinely taking herself to the bathroom with IV pole Toileting- Clothing Manipulation and Hygiene: Modified  independent;Sit to/from stand       Functional mobility during ADLs: Supervision/safety (pushing IV pole in hall, mod I within room) General ADL Comments: Ambulated down hall pushing IV pole at pt's request.      Vision                     Perception     Praxis      Cognition   Behavior During Therapy: [facility] for tasks assessed/performed Overall Cognitive Status: Within Functional Limits for tasks assessed                       Extremity/Trunk Assessment               Exercises     Shoulder Instructions       General Comments      Pertinent Vitals/ Pain       Pain Assessment: Faces Faces Pain Scale: Hurts little more Pain Location: R LE Pain Descriptors / Indicators: Tightness Pain Intervention(s): Limited activity within patient's tolerance;Monitored during session;Repositioned  Home Living                                          Prior Functioning/Environment  Frequency Min 2X/week     Progress Toward Goals  OT Goals(current goals can now be found in the care plan section)  Progress towards OT goals: Progressing toward goals  Acute Rehab OT Goals Patient Stated Goal: feel better and go home  Plan Discharge plan remains appropriate    Co-evaluation                 End of Session     Activity Tolerance Patient tolerated treatment well   Patient Left in chair;with call bell/phone within reach;with family/visitor present   Nurse Communication  (IV alarming)        Time: 1141-1155 OT Time Calculation (min): 14 min  Charges: OT General Charges $OT Visit: 1 Procedure OT Treatments $Self Care/Home Management : 8-22 mins  Malka So 08/19/2014, 12:11 PM  [phone]

## 2014-08-19 NOTE — Progress Notes (Signed)
PT Cancellation Note  Patient Details Name: Tracy [name] MRN: [id number] DOB: Feb 14, 1985   Cancelled Treatment:    Reason Eval/Treat Not Completed: Fatigue/lethargy limiting ability to participate (LE edema).  Patient with increased edema BLE's - sitting with LE's elevated.  Had been ambulating earlier in day.  Declined PT at this time.  Will return at a later date for PT session.   Despina Pole 08/19/2014, 5:51 PM Carita Pian. Sanjuana Kava, [place] Pager [phone]

## 2014-08-19 NOTE — Progress Notes (Signed)
Ancef per pharmacy  ID: Abx day #11 for MSSA myositis/myonecrosis on RUE and RLE.  WBC 25.5>20.8>21.6, Afebrile.  Pt with hx of allergic rxn to PCN: per pt she had urticaria to PCN in the 9th grade, no respiratory distress.  She got cefepime 2 mg x 1 on 01/20/2014 with no reaction.  She has gotten first dose of ancef with no allergic rxn per RN report.  New HD pt - ARF on CKD.  Unclear if she has progressed to ESRD. To be assessed daily for HD needs/plans. Had HD on 07/05 and 07/06.    7/3 debridement in OR 7/6 R to biopsy and aspirate muscle of R thigh  6/28 vanc>>7/6 7/5 VR prior to pt's first HD = 29 mcg/ml (goal typically 15-25 mcg/ml prior to HD).  6/28 aztreonam >>7/4 7/4 clinda >>7/5 7/6 ancef>>  6/28 urine cx: >100,000 lactobacillus F 6/28 blood cx: negative F 7/3: Tissue/Abscess x 2: MSSA 7/6 thigh abscess>> gram positive cocci in clusters  Plan: -Ancef 2 gm IV q24, give after HD on HD days if HD for now. If no HD and renal function improves, will need to reassess dosing -f/u for HD sessions / changes

## 2014-08-19 NOTE — Progress Notes (Signed)
Patient ID: Tracy [name], female   DOB: 27-Jul-1985, 29 y.o.   MRN: [id number]         [facility]    Date of Admission:  08/09/2014   Total days of antibiotics 10 Day 3 of cefazolin  Principal Problem:   Myositis Active Problems:   Proliferative diabetic retinopathy   Hypothyroidism   Normocytic anemia   Acute renal failure superimposed on stage 4 chronic kidney disease   Seizure   Sepsis   DM I (diabetes mellitus, type I), uncontrolled   Essential hypertension   . sodium chloride   Intravenous Once  . amLODipine  10 mg Oral Daily  .  ceFAZolin (ANCEF) IV  2 g Intravenous Q24H  . heparin  5,000 Units Subcutaneous 3 times per day  . insulin aspart  0-15 Units Subcutaneous TID WC  . insulin aspart  0-5 Units Subcutaneous QHS  . insulin aspart  2 Units Subcutaneous TID WC  . insulin glargine  15 Units Subcutaneous Daily  . levothyroxine  75 mcg Oral QAC breakfast  . metoprolol tartrate  50 mg Oral BID  . multivitamin with minerals  1 tablet Oral Daily  . senna-docusate  2 tablet Oral QHS  . sodium chloride  1,000 mL Intravenous Once  . sodium chloride  3 mL Intravenous Q12H    Subjective: Tracy [name] was in high spirits this morning after discovering debridement of her right thigh was deemed unnecessary this morning by the orthopedic team. She states her pain is well controlled and has no complaints. Yesterday she stood and did some walking with physical therapy. Her pain is improving.  Review of Systems: Pertinent items are noted in HPI.  Past Medical History  Diagnosis Date  . Thyroid enlargement     "not on medication at this time"  . Urinary tract infection     hx of  . Anemia     presently on iron supplement  . Diabetes mellitus     insulin pump   . Hypothyroidism   . Anxiety   . HSV-2 (herpes simplex virus 2) infection   . HSV-1 (herpes  simplex virus 1) infection   . Detached retina   . Yeast infection     took diflucan saturday   . Hypertension   . Irregular periods 07/05/2014  . Vaginal discharge 07/05/2014    History  Substance Use Topics  . Smoking status: Never Smoker   . Smokeless tobacco: Never Used  . Alcohol Use: No    Family History  Problem Relation Age of Onset  . Cancer Paternal Grandfather     prostate  . Hyperlipidemia Paternal Grandfather   . Stroke Paternal Grandfather    Allergies  Allergen Reactions  . Penicillins Hives and Swelling  . Sulfa Antibiotics Hives    OBJECTIVE: Blood pressure 126/86, pulse 85, temperature 98.8 F (37.1 C), temperature source Oral, resp. rate 18, height 5\' 3"  (1.6 m), weight 87.3 kg (192 lb 7.4 oz), last menstrual period 07/18/2014, SpO2 100 %. General: Lying in bed eating. Skin: Warm, dry. Bandages on right arm and thigh.  Lab Results Lab Results  Component Value Date   WBC 21.6* 08/18/2014   HGB 9.4* 08/18/2014   HCT 28.2* 08/18/2014   MCV 84.9 08/18/2014   PLT 477* 08/18/2014    Lab Results  Component Value Date   CREATININE 5.11* 08/18/2014   BUN 35* 08/18/2014   NA 136 08/18/2014   K 3.6 08/18/2014   CL  100* 08/18/2014   CO2 22 08/18/2014    Lab Results  Component Value Date   ALT 18 08/10/2014   AST 38 08/10/2014   ALKPHOS 166* 08/10/2014   BILITOT 0.6 08/10/2014     Microbiology: Recent Results (from the past 240 hour(s))  Urine culture     Status: None   Collection Time: 08/09/14  1:57 PM  Result Value Ref Range Status   Specimen Description URINE, CLEAN CATCH  Final   Special Requests NONE  Final   Culture >=100,000 COLONIES/mL LACTOBACILLUS SPECIES  Final   Report Status 08/11/2014 FINAL  Final  Culture, blood (x 2)     Status: None   Collection Time: 08/09/14 11:42 PM  Result Value Ref Range Status   Specimen Description BLOOD LEFT HAND  Final   Special Requests BOTTLES DRAWN AEROBIC ONLY [place]  Final   Culture NO GROWTH  5 DAYS  Final   Report Status 08/15/2014 FINAL  Final  Culture, blood (x 2)     Status: None   Collection Time: 08/09/14 11:50 PM  Result Value Ref Range Status   Specimen Description BLOOD RIGHT HAND  Final   Special Requests   Final    BOTTLES DRAWN AEROBIC AND ANAEROBIC [place] BLUE 5CC PURPLE   Culture NO GROWTH 5 DAYS  Final   Report Status 08/15/2014 FINAL  Final  Anaerobic culture     Status: None   Collection Time: 08/14/14  2:32 PM  Result Value Ref Range Status   Specimen Description FLUID  Final   Special Requests RIGHT ARM FLUID A  Final   Gram Stain   Final    MODERATE WBC PRESENT,BOTH PMN AND MONONUCLEAR NO SQUAMOUS EPITHELIAL CELLS SEEN RARE GRAM POSITIVE COCCI IN PAIRS IN CLUSTERS Performed at [facility]    Culture   Final    NO ANAEROBES ISOLATED Performed at [facility]    Report Status 08/19/2014 FINAL  Final  Culture, routine-abscess     Status: None   Collection Time: 08/14/14  2:32 PM  Result Value Ref Range Status   Specimen Description ARM RIGHT  Final   Special Requests NONE  Final   Gram Stain   Final    FEW WBC PRESENT, PREDOMINANTLY PMN NO SQUAMOUS EPITHELIAL CELLS SEEN RARE GRAM POSITIVE COCCI IN CLUSTERS Performed at [facility]    Culture   Final    FEW STAPHYLOCOCCUS AUREUS Note: RIFAMPIN AND GENTAMICIN SHOULD NOT BE USED AS SINGLE DRUGS FOR TREATMENT OF STAPH INFECTIONS. Performed at [facility]    Report Status 08/17/2014 FINAL  Final   Organism ID, Bacteria STAPHYLOCOCCUS AUREUS  Final      Susceptibility   Staphylococcus aureus - MIC*    CLINDAMYCIN <=0.25 SENSITIVE Sensitive     ERYTHROMYCIN <=0.25 SENSITIVE Sensitive     GENTAMICIN <=0.5 SENSITIVE Sensitive     LEVOFLOXACIN 0.25 SENSITIVE Sensitive     OXACILLIN <=0.25 SENSITIVE Sensitive     PENICILLIN 0.06 SENSITIVE Sensitive     RIFAMPIN <=0.5 SENSITIVE Sensitive     TRIMETH/SULFA <=10 SENSITIVE Sensitive     VANCOMYCIN 1 SENSITIVE  Sensitive     TETRACYCLINE <=1 SENSITIVE Sensitive     MOXIFLOXACIN <=0.25 SENSITIVE Sensitive     * FEW STAPHYLOCOCCUS AUREUS  Anaerobic culture     Status: None   Collection Time: 08/14/14  2:36 PM  Result Value Ref Range Status   Specimen Description TISSUE  Final   Special Requests RIGHT  ARM B  Final   Gram Stain   Final    NO WBC SEEN NO SQUAMOUS EPITHELIAL CELLS SEEN RARE GRAM POSITIVE COCCI IN PAIRS IN CLUSTERS Performed at [facility]    Culture   Final    NO ANAEROBES ISOLATED Performed at [facility]    Report Status 08/19/2014 FINAL  Final  Tissue culture     Status: None   Collection Time: 08/14/14  2:36 PM  Result Value Ref Range Status   Specimen Description TISSUE  Final   Special Requests RIGHT ARM B  Final   Gram Stain   Final    RARE WBC PRESENT, PREDOMINANTLY PMN NO SQUAMOUS EPITHELIAL CELLS SEEN RARE GRAM POSITIVE COCCI IN PAIRS Performed at [facility]    Culture   Final    MODERATE STAPHYLOCOCCUS AUREUS Note: RIFAMPIN AND GENTAMICIN SHOULD NOT BE USED AS SINGLE DRUGS FOR TREATMENT OF STAPH INFECTIONS. Performed at [facility]    Report Status 08/17/2014 FINAL  Final   Organism ID, Bacteria STAPHYLOCOCCUS AUREUS  Final      Susceptibility   Staphylococcus aureus - MIC*    CLINDAMYCIN <=0.25 SENSITIVE Sensitive     ERYTHROMYCIN <=0.25 SENSITIVE Sensitive     GENTAMICIN <=0.5 SENSITIVE Sensitive     LEVOFLOXACIN 0.25 SENSITIVE Sensitive     OXACILLIN <=0.25 SENSITIVE Sensitive     PENICILLIN 0.06 SENSITIVE Sensitive     RIFAMPIN <=0.5 SENSITIVE Sensitive     TRIMETH/SULFA <=10 SENSITIVE Sensitive     VANCOMYCIN 1 SENSITIVE Sensitive     TETRACYCLINE <=1 SENSITIVE Sensitive     MOXIFLOXACIN <=0.25 SENSITIVE Sensitive     * MODERATE STAPHYLOCOCCUS AUREUS  Culture, routine-abscess     Status: None (Preliminary result)   Collection Time: 08/17/14  3:38 PM  Result Value Ref Range Status   Specimen Description  ABSCESS RIGHT THIGH  Final   Special Requests Normal  Final   Gram Stain   Final    FEW WBC PRESENT, PREDOMINANTLY MONONUCLEAR NO SQUAMOUS EPITHELIAL CELLS SEEN MODERATE GRAM POSITIVE COCCI IN PAIRS IN CLUSTERS Performed at [facility]    Culture PENDING  Incomplete   Report Status PENDING  Incomplete  Anaerobic culture     Status: None (Preliminary result)   Collection Time: 08/17/14  3:38 PM  Result Value Ref Range Status   Specimen Description ABSCESS RIGHT THIGH  Final   Special Requests Normal  Final   Gram Stain   Final    FEW WBC PRESENT, PREDOMINANTLY MONONUCLEAR NO SQUAMOUS EPITHELIAL CELLS SEEN MODERATE GRAM POSITIVE COCCI IN PAIRS IN CLUSTERS Performed at [facility]    Culture PENDING  Incomplete   Report Status PENDING  Incomplete    Assessment: Tracy [name]'s gram stain of her right thigh revealed GPCs in pairs and clusters suggestive of MSSA which was cultured in her right arm. We recommend continuing the cefazolin and we will follow cultures. We will follow her need for hemodialysis.  Plan: 1. Continue cefazolin for MSSA myositis for at least 3 weeks total. 2. We will follow cultures of her right thigh 3. Please call Dr. Carlyle Basques [phone]) for any infectious disease questions this weekend  Loleta Chance, [place] [facility] 08/19/2014, 11:35 AM

## 2014-08-19 NOTE — Progress Notes (Signed)
Patient ID: Tracy [name], female   DOB: [birth year], 29 y.o.   MRN: [id number] Patient is seen and examined  I have reviewed her right upper extremity at length.  Parietal and left upper extremities are nearly equal in terms of swelling. There is no signs of worsening problems. She is nontender to palpation. I dressed her wound cleansed the wound and note that she remains neurovascularly intact.  I feel that she's made ample progress. She flexes to 90 and the remaining aspects of her range of motion examination are benign.  At present time we'll await watch and see how this continues to evolve. I don't feel need to go back in for a second irrigation and debridement.  Dr. Percell Miller and I discussed a gentle manipulation under anesthesia of the arm tomorrow.  Should any problems occur I'll be available otherwise we'll see her Sunday night/Monday morning  Certainly she's made good progress. This is noted by multiple serial exams over the last 10 days and the irrigation and debridement last Sunday  Larone Kliethermes M.D.

## 2014-08-20 ENCOUNTER — Inpatient Hospital Stay (HOSPITAL_COMMUNITY): Payer: BLUE CROSS/BLUE SHIELD | Admitting: Anesthesiology

## 2014-08-20 ENCOUNTER — Encounter (HOSPITAL_COMMUNITY)
Admission: EM | Disposition: A | Discharge status: Home Health | Payer: Self-pay | Source: Home / Self Care | Attending: Internal Medicine

## 2014-08-20 ENCOUNTER — Encounter (HOSPITAL_COMMUNITY): Payer: Self-pay | Admitting: Anesthesiology

## 2014-08-20 HISTORY — PX: I&D EXTREMITY: SHX5045

## 2014-08-20 LAB — GLUCOSE, CAPILLARY
GLUCOSE-CAPILLARY: 189 mg/dL — AB (ref 65–99)
Glucose-Capillary: 174 mg/dL — ABNORMAL HIGH (ref 65–99)
Glucose-Capillary: 181 mg/dL — ABNORMAL HIGH (ref 65–99)
Glucose-Capillary: 199 mg/dL — ABNORMAL HIGH (ref 65–99)

## 2014-08-20 LAB — RENAL FUNCTION PANEL
ALBUMIN: 1.5 g/dL — AB (ref 3.5–5.0)
ANION GAP: 10 (ref 5–15)
BUN: 35 mg/dL — ABNORMAL HIGH (ref 6–20)
CALCIUM: 7.9 mg/dL — AB (ref 8.9–10.3)
CO2: 25 mmol/L (ref 22–32)
Chloride: 99 mmol/L — ABNORMAL LOW (ref 101–111)
Creatinine, Ser: 5.71 mg/dL — ABNORMAL HIGH (ref 0.44–1.00)
GFR calc Af Amer: 11 mL/min — ABNORMAL LOW (ref >60)
GFR calc non Af Amer: 9 mL/min — ABNORMAL LOW (ref >60)
Glucose, Bld: 181 mg/dL — ABNORMAL HIGH (ref 65–99)
Phosphorus: 4.2 mg/dL (ref 2.5–4.6)
Potassium: 3.5 mmol/L (ref 3.5–5.1)
SODIUM: 134 mmol/L — AB (ref 135–145)

## 2014-08-20 LAB — CBC
HCT: 25.8 % — ABNORMAL LOW (ref 36.0–46.0)
Hemoglobin: 8.4 g/dL — ABNORMAL LOW (ref 12.0–15.0)
MCH: 28.1 pg (ref 26.0–34.0)
MCHC: 32.6 g/dL (ref 30.0–36.0)
MCV: 86.3 fL (ref 78.0–100.0)
Platelets: 416 10*3/uL — ABNORMAL HIGH (ref 150–400)
RBC: 2.99 MIL/uL — ABNORMAL LOW (ref 3.87–5.11)
RDW: 15.1 % (ref 11.5–15.5)
WBC: 17.3 10*3/uL — ABNORMAL HIGH (ref 4.0–10.5)

## 2014-08-20 SURGERY — IRRIGATION AND DEBRIDEMENT EXTREMITY
Anesthesia: General | Site: Leg Upper | Laterality: Right

## 2014-08-20 MED ORDER — SODIUM CHLORIDE 0.9 % IJ SOLN
INTRAMUSCULAR | Status: AC
  Filled 2014-08-20: qty 10

## 2014-08-20 MED ORDER — HYDROMORPHONE HCL 1 MG/ML IJ SOLN
INTRAMUSCULAR | Status: AC
  Administered 2014-08-20: 0.5 mg via INTRAVENOUS
  Filled 2014-08-20: qty 1

## 2014-08-20 MED ORDER — ARTIFICIAL TEARS OP OINT
TOPICAL_OINTMENT | OPHTHALMIC | Status: AC
  Filled 2014-08-20: qty 3.5

## 2014-08-20 MED ORDER — PROMETHAZINE HCL 25 MG/ML IJ SOLN: 6.2500 mg | INTRAMUSCULAR | Status: DC | PRN

## 2014-08-20 MED ORDER — HYDROMORPHONE HCL 1 MG/ML IJ SOLN
0.5000 mg | INTRAMUSCULAR | Status: DC | PRN
  Administered 2014-08-20 (×2): 0.5 mg via INTRAVENOUS

## 2014-08-20 MED ORDER — EPHEDRINE SULFATE 50 MG/ML IJ SOLN
INTRAMUSCULAR | Status: AC
  Filled 2014-08-20: qty 1

## 2014-08-20 MED ORDER — MIDAZOLAM HCL 5 MG/5ML IJ SOLN
INTRAMUSCULAR | Status: DC | PRN
  Administered 2014-08-20 (×2): 1 mg via INTRAVENOUS

## 2014-08-20 MED ORDER — OXYCODONE HCL 5 MG PO TABS
ORAL_TABLET | ORAL | Status: AC
  Administered 2014-08-20: 5 mg via ORAL
  Filled 2014-08-20: qty 1

## 2014-08-20 MED ORDER — PROPOFOL 10 MG/ML IV BOLUS
INTRAVENOUS | Status: AC
  Filled 2014-08-20: qty 20

## 2014-08-20 MED ORDER — GLYCOPYRROLATE 0.2 MG/ML IJ SOLN
INTRAMUSCULAR | Status: AC
  Filled 2014-08-20: qty 1

## 2014-08-20 MED ORDER — HYDROMORPHONE HCL 1 MG/ML IJ SOLN
0.2500 mg | INTRAMUSCULAR | Status: DC | PRN
  Administered 2014-08-20 (×4): 0.5 mg via INTRAVENOUS

## 2014-08-20 MED ORDER — OXYCODONE HCL 5 MG PO TABS: 5.0000 mg | ORAL_TABLET | Freq: Once | ORAL | Status: DC

## 2014-08-20 MED ORDER — LIDOCAINE HCL (CARDIAC) 20 MG/ML IV SOLN
INTRAVENOUS | Status: DC | PRN
  Administered 2014-08-20: 60 mg via INTRAVENOUS

## 2014-08-20 MED ORDER — OXYCODONE HCL 5 MG PO TABS
5.0000 mg | ORAL_TABLET | Freq: Once | ORAL | Status: AC | PRN
  Administered 2014-08-20: 5 mg via ORAL

## 2014-08-20 MED ORDER — ACETAMINOPHEN 10 MG/ML IV SOLN
1000.0000 mg | Freq: Four times a day (QID) | INTRAVENOUS | Status: DC
  Administered 2014-08-20: 1000 mg via INTRAVENOUS

## 2014-08-20 MED ORDER — CEFAZOLIN SODIUM-DEXTROSE 2-3 GM-% IV SOLR
INTRAVENOUS | Status: DC | PRN
  Administered 2014-08-20: 2 g via INTRAVENOUS

## 2014-08-20 MED ORDER — MIDAZOLAM HCL 2 MG/2ML IJ SOLN
INTRAMUSCULAR | Status: AC
  Filled 2014-08-20: qty 2

## 2014-08-20 MED ORDER — ACETAMINOPHEN 10 MG/ML IV SOLN
INTRAVENOUS | Status: AC
  Administered 2014-08-20: 1000 mg via INTRAVENOUS
  Filled 2014-08-20: qty 100

## 2014-08-20 MED ORDER — OXYCODONE HCL 5 MG PO TABS
5.0000 mg | ORAL_TABLET | ORAL | Status: DC | PRN
  Administered 2014-08-20 – 2014-08-22 (×9): 10 mg via ORAL
  Filled 2014-08-20 (×9): qty 2
  Filled 2014-08-20: qty 1
  Filled 2014-08-20: qty 2

## 2014-08-20 MED ORDER — LEVOTHYROXINE SODIUM 100 MCG PO TABS
100.0000 ug | ORAL_TABLET | Freq: Every day | ORAL | Status: DC
  Administered 2014-08-21 – 2014-09-02 (×13): 100 ug via ORAL
  Filled 2014-08-20 (×18): qty 1

## 2014-08-20 MED ORDER — FENTANYL CITRATE (PF) 250 MCG/5ML IJ SOLN
INTRAMUSCULAR | Status: AC
  Filled 2014-08-20: qty 5

## 2014-08-20 MED ORDER — ROCURONIUM BROMIDE 50 MG/5ML IV SOLN
INTRAVENOUS | Status: AC
  Filled 2014-08-20: qty 1

## 2014-08-20 MED ORDER — 0.9 % SODIUM CHLORIDE (POUR BTL) OPTIME
TOPICAL | Status: DC | PRN
  Administered 2014-08-20: 1000 mL

## 2014-08-20 MED ORDER — OXYCODONE HCL 5 MG/5ML PO SOLN: 5.0000 mg | Freq: Once | ORAL | Status: AC | PRN

## 2014-08-20 MED ORDER — ONDANSETRON HCL 4 MG/2ML IJ SOLN
INTRAMUSCULAR | Status: DC | PRN
  Administered 2014-08-20: 4 mg via INTRAVENOUS

## 2014-08-20 MED ORDER — SODIUM CHLORIDE 0.9 % IR SOLN
Status: DC | PRN
  Administered 2014-08-20: 3000 mL

## 2014-08-20 MED ORDER — PROPOFOL 10 MG/ML IV BOLUS
INTRAVENOUS | Status: DC | PRN
  Administered 2014-08-20: 20 mg via INTRAVENOUS
  Administered 2014-08-20: 200 mg via INTRAVENOUS

## 2014-08-20 MED ORDER — SODIUM CHLORIDE 0.9 % IV SOLN
INTRAVENOUS | Status: DC
  Administered 2014-08-20 (×2): via INTRAVENOUS

## 2014-08-20 MED ORDER — LEVOTHYROXINE SODIUM 100 MCG PO TABS: 100.0000 ug | ORAL_TABLET | Freq: Every day | ORAL | Status: DC

## 2014-08-20 MED ORDER — FENTANYL CITRATE (PF) 100 MCG/2ML IJ SOLN
INTRAMUSCULAR | Status: DC | PRN
  Administered 2014-08-20 (×5): 50 ug via INTRAVENOUS

## 2014-08-20 MED ORDER — ONDANSETRON HCL 4 MG/2ML IJ SOLN
INTRAMUSCULAR | Status: AC
  Filled 2014-08-20: qty 2

## 2014-08-20 MED ORDER — LIDOCAINE HCL (CARDIAC) 20 MG/ML IV SOLN
INTRAVENOUS | Status: AC
  Filled 2014-08-20: qty 5

## 2014-08-20 MED ORDER — SUCCINYLCHOLINE CHLORIDE 20 MG/ML IJ SOLN
INTRAMUSCULAR | Status: AC
  Filled 2014-08-20: qty 1

## 2014-08-20 SURGICAL SUPPLY — 51 items
BANDAGE ELASTIC 4 VELCRO ST LF (GAUZE/BANDAGES/DRESSINGS) ×2 IMPLANT
BANDAGE ELASTIC 6 VELCRO ST LF (GAUZE/BANDAGES/DRESSINGS) ×2 IMPLANT
BLADE SURG 10 STRL SS (BLADE) ×1 IMPLANT
BNDG COHESIVE 4X5 TAN STRL (GAUZE/BANDAGES/DRESSINGS) ×2 IMPLANT
BNDG GAUZE ELAST 4 BULKY (GAUZE/BANDAGES/DRESSINGS) ×2 IMPLANT
BOOTCOVER CLEANROOM LRG (PROTECTIVE WEAR) ×2 IMPLANT
CANISTER WOUND CARE 500ML ATS (WOUND CARE) ×1 IMPLANT
CONT SPEC 4OZ CLIKSEAL STRL BL (MISCELLANEOUS) ×1 IMPLANT
COVER SURGICAL LIGHT HANDLE (MISCELLANEOUS) ×2 IMPLANT
CUFF TOURNIQUET SINGLE 34IN LL (TOURNIQUET CUFF) ×1 IMPLANT
DRAPE ORTHO SPLIT 77X108 STRL (DRAPES) ×6
DRAPE SURG ORHT 6 SPLT 77X108 (DRAPES) IMPLANT
DRSG VAC ATS SM SENSATRAC (GAUZE/BANDAGES/DRESSINGS) ×1 IMPLANT
DURAPREP 26ML APPLICATOR (WOUND CARE) ×2 IMPLANT
ELECT REM PT RETURN 9FT ADLT (ELECTROSURGICAL)
ELECTRODE REM PT RTRN 9FT ADLT (ELECTROSURGICAL) IMPLANT
EVACUATOR 1/8 PVC DRAIN (DRAIN) ×1 IMPLANT
FACESHIELD WRAPAROUND (MASK) IMPLANT
FACESHIELD WRAPAROUND OR TEAM (MASK) ×3 IMPLANT
GAUZE SPONGE 4X4 12PLY STRL (GAUZE/BANDAGES/DRESSINGS) ×2 IMPLANT
GAUZE XEROFORM 1X8 LF (GAUZE/BANDAGES/DRESSINGS) ×1 IMPLANT
GAUZE XEROFORM 5X9 LF (GAUZE/BANDAGES/DRESSINGS) ×1 IMPLANT
GLOVE BIO SURGEON STRL SZ7 (GLOVE) ×2 IMPLANT
GLOVE BIO SURGEON STRL SZ8 (GLOVE) ×2 IMPLANT
GLOVE BIOGEL PI IND STRL 7.0 (GLOVE) ×1 IMPLANT
GLOVE BIOGEL PI INDICATOR 7.0 (GLOVE) ×2
GLOVE ORTHO TXT STRL SZ7.5 (GLOVE) ×4 IMPLANT
GOWN STRL REUS W/ TWL LRG LVL3 (GOWN DISPOSABLE) ×3 IMPLANT
GOWN STRL REUS W/TWL 2XL LVL3 (GOWN DISPOSABLE) ×1 IMPLANT
GOWN STRL REUS W/TWL LRG LVL3 (GOWN DISPOSABLE) ×6
HANDPIECE INTERPULSE COAX TIP (DISPOSABLE)
KIT BASIN OR (CUSTOM PROCEDURE TRAY) ×2 IMPLANT
KIT ROOM TURNOVER OR (KITS) ×2 IMPLANT
MANIFOLD NEPTUNE II (INSTRUMENTS) ×2 IMPLANT
NS IRRIG 1000ML POUR BTL (IV SOLUTION) ×2 IMPLANT
PACK ORTHO EXTREMITY (CUSTOM PROCEDURE TRAY) ×2 IMPLANT
PAD ARMBOARD 7.5X6 YLW CONV (MISCELLANEOUS) ×4 IMPLANT
PENCIL BUTTON HOLSTER BLD 10FT (ELECTRODE) IMPLANT
SET HNDPC FAN SPRY TIP SCT (DISPOSABLE) IMPLANT
SPONGE LAP 18X18 X RAY DECT (DISPOSABLE) ×2 IMPLANT
STOCKINETTE IMPERVIOUS 9X36 MD (GAUZE/BANDAGES/DRESSINGS) ×2 IMPLANT
SUT ETHILON 2 0 FS 18 (SUTURE) ×1 IMPLANT
SUT ETHILON 3 0 PS 1 (SUTURE) ×2 IMPLANT
TOWEL OR 17X24 6PK STRL BLUE (TOWEL DISPOSABLE) ×2 IMPLANT
TOWEL OR 17X26 10 PK STRL BLUE (TOWEL DISPOSABLE) ×2 IMPLANT
TOWEL OR NON WOVEN STRL DISP B (DISPOSABLE) ×2 IMPLANT
TUBE ANAEROBIC SPECIMEN COL (MISCELLANEOUS) IMPLANT
TUBE CONNECTING 12X1/4 (SUCTIONS) ×2 IMPLANT
UNDERPAD 30X30 INCONTINENT (UNDERPADS AND DIAPERS) ×2 IMPLANT
WATER STERILE IRR 1000ML POUR (IV SOLUTION) ×2 IMPLANT
YANKAUER SUCT BULB TIP NO VENT (SUCTIONS) ×2 IMPLANT

## 2014-08-20 NOTE — Anesthesia Postprocedure Evaluation (Signed)
  Anesthesia Post-op Note  Patient: Tracy [name]  Procedure(s) Performed: Procedure(s):  I AND D LEG / THIGH ABSCESS AND MANIPULATION OF RIGHT ELBOW. (Right)  Patient Location: PACU  Anesthesia Type:General  Level of Consciousness: awake, alert  and oriented  Airway and Oxygen Therapy: Patient Spontanous Breathing  Post-op Pain: mild  Post-op Assessment: Post-op Vital signs reviewed              Post-op Vital Signs: Reviewed  Last Vitals:  Filed Vitals:   08/20/14 1326  BP: 140/70  Pulse: 88  Temp: 36.8 C  Resp: 16    Complications: No apparent anesthesia complications

## 2014-08-20 NOTE — Op Note (Signed)
08/09/2014 - 08/20/2014  11:02 AM  PATIENT:  Tracy [name]    PRE-OPERATIVE DIAGNOSIS:  abscess  POST-OPERATIVE DIAGNOSIS:  Same  PROCEDURE:   I AND D LEG / THIGH ABSCESS  SURGEON:  Devyn Sheerin, Ernesta Amble, MD  ASSISTANT: Lovett Calender, PA-C, She was present and scrubbed throughout the case, critical for completion in a timely fashion, and for retraction, instrumentation, and closure.  ANESTHESIA:   gen  PREOPERATIVE INDICATIONS:  Tracy [name] is a  29 y.o. female with a diagnosis of abscess who failed conservative measures and elected for surgical management.    The risks benefits and alternatives were discussed with the patient preoperatively including but not limited to the risks of infection, bleeding, nerve injury, cardiopulmonary complications, the need for revision surgery, among others, and the patient was willing to proceed.  OPERATIVE IMPLANTS: none  OPERATIVE FINDINGS: purulent collection in vastus medialis.   BLOOD LOSS: [id number]  COMPLICATIONS: none  TOURNIQUET TIME: none  OPERATIVE PROCEDURE:  Patient was identified in the preoperative holding area and site was marked by me She was transported to the operating theater and placed on the table in supine position taking care to pad all bony prominences. After a preincinduction time out anesthesia was induced. The RLE extremity was prepped and draped in normal sterile fashion and a pre-incision timeout was performed. She received ancef for preoperative antibiotics.     Prepping and draping I examined her right upper extremity her incision was benign. I previously discussed with Dr. Phillip Heal include been treating this and he felt she would benefit from a manipulation under anesthesia. I discussed this with the patient and she is in agreement to proceeding with a manipulation of her elbow to help with range of motion.  Prior to prepping and draping I performed a manipulation of her elbow she had full extension I flexed her  up to roughly 135 she had 90 of pronation and at 90 of supination I then replaced a sterile dressing over her elbow incision.  I then turned my attention to her right lower extremity this was prepped and draped in normal sterile fashion. I made a roughly 7 cm incision over her medial aspect of her quadriceps muscles. As I approached the muscle compartment I worked my way slightly anterior to avoid her neurovascular bundle. Once I identified her quads muscles I would enter them just medial to her rectus for Morris. Identified the femur worked my way just medial this saying periosteally adjacent. Identified to pockets of purulent fluid within her vastus medialis and just posterior to this. This was expressed and I opened up his compartments completely. We saw no brisk bleeding.  I debrided some necrotic-appearing appearing muscle but most of it did respond well to Bovie stimulation.  I then thoroughly irrigated with 6 L of saline.  I placed a deep drain in the spot. I then closed her skin leaving it somewhat loose to allow drainage if needed I placed a wound VAC dressing and she was taken to the PACU in stable condition.  POST OPERATIVE PLAN: continue abx, WBAT, PT    This note was generated using a template and dragon dictation system. In light of that, I have reviewed the note and all aspects of it are applicable to this case. Any dictation errors are due to the computerized dictation system.

## 2014-08-20 NOTE — Progress Notes (Signed)
Patient Demographics:    Tracy [name], is a 29 y.o. female, DOB - May 28, 1985, [id number]  Admit date - 08/09/2014   Admitting Physician Ivor Costa, MD  Outpatient Primary MD for the patient is [name]  LOS - 11   Chief Complaint  Patient presents with  . Arm Pain      Summary  This is a 29 year old Caucasian female with history of diabetes mellitus on insulin pump but poor control, hypothyroidism poor compliance she stopped taking her Synthroid 2 months ago for no known reason, chronic kidney disease stage IV baseline creatinine close to 3, who was admitted to the hospital 11 days ago for 2 week history of right arm and right leg pain, upon further workup with MRI was found that she had extensive myositis in both right-sided extremities. She was initially seen at [facility] in [place] and then transferred to [facility].   She was seen here by hand surgery, general surgery, orthopedic surgery, renal, infectious disease, phone consultation was made by me with rheumatologist Dr. Amil Amen. Initially no clear reason of myositis could be found, however since her clinical course did not show any improvement she was taken to the OR first by Dr. Amedeo Plenty about 5 days ago and right arm fluid was drained which was suggestive of infectious myositis, she has been on IV antibiotics throughout her hospital stay. On 08/20/2014 she is due to go to OR again by orthopedic surgery for drainage of right leg fluid collection. Her workup certainly suggests infectious myositis however reason for her bacteremia is not clear at all. ID is on board as well.  Note due to her illness patient's has progressed from chronic kidney disease stage IV to close to ESRD, currently under dialysis for the last 7 days. Renal  to decide whether this is ESRD or not.    Subjective:    Tracy [name] today has, No headache, No chest pain, No abdominal pain - No Nausea, No new weakness tingling or numbness, No Cough - SOB. Continues to have right arm and right thigh pain but overall feels much improved on a daily basis. She says now she is able to ambulate without feeling much pain in her legs.   Assessment  & Plan :     1. Myositis of the right arm and right thigh. Most likely post physical strain 2 weeks ago while throwing horse feed with some muscle injury with secondary infection. Case discussed with rheumatologist Dr. Amil Amen over the phone on the day of admission and then on 08/14/2014 she was also seen this admission by infectious disease, general surgery, hand surgery and orthopedics.   She is post right arm muscle biopsy, fluid drainage and JP drain placement on 08/14/2014 by hand surgery and right leg CT-guided drained by IR on 08/18/2014, so far cultures growing MSSA. ID on board now on cefazolin IV per ID. Source of bacteremia unclear, echocardiogram unremarkable, denies any IV drug use.  Orthopedics Dr. Edmonia Lynch following closely and taking the patient to the OR for washout of the right thigh on 08/20/2014.     2. Hypothyroidism. Was not taking Synthroid for the last couple of months, counseled on compliance, TSH was 25, Synthroid dose adjusted TSH is improving.  3. Anemia - iron deficiency on into anemia panel, IV Iron replaced. Transfused 2 units of packed RBC on 08/17/2014 with dialysis. Outpatient workup for iron deficiency anemia in a menstruating female as appropriate.    4.ARF of CKD 4 - baseline creatinine of 3, had some nausea vomiting and NSAID use prior to admission, likely ATN. Avoid nephrotoxins, continue hydration, renal ultrasound shows cyst but no obstruction, urine output stable, Renal following. Started on dialysis on 08/16/2014 via right IJ dialysis catheter placed by IR  08/15/2014 could require permanent dialysis.    5. DM1 - poor outpatient control with A1c of 8.9 , on Lantus, sliding scale adjusted placed on pre-meal NovoLog, question dietary compliance with rapid fluctuation in sugars over the last 24 hours.  CBG (last 3)   Recent Labs  08/19/14 1701 08/19/14 2049 08/20/14 0832  GLUCAP 128* 80 174*    Lab Results  Component Value Date   HGBA1C 8.9* 08/10/2014     6. UA appears clean with 0-2 WBCs ruling out UTI, cultures growing lactobacilli which likely is contamination from vaginal flora. Monitor clinically.     Code Status : Full  Family Communication  : Mother bedside, boyfriend bedside, dad bedside  Disposition Plan  : Home   Consults  :  Orthopedics, hand surgery, general surgery, ID, Renal, rheumatologist Dr. Amil Amen over the phone 2, ID, IR  Procedures  :   R Arm - Muscle biopsy, fluid drainage and JP drain placement 08-14-14 by Dr Amedeo Plenty.  Right upper extremity venous duplex. No DVT, MRI right arm and right leg x 2, CT right thigh  Right IJ dialysis catheter placed by IR on 08/15/2014  Started on dialysis 08/16/2014  R Leg US guided Aspiration by IR on 08-17-14  Right leg exploration in OR by Dr. Edmonia Lynch due on 08/20/2014   TTE  - Left ventricle: The cavity size was normal. Wall thickness wasincreased in a pattern of mild LVH. Systolic function wasvigorous. The estimated ejection fraction was in the range of 65%to 70%. Wall motion was normal; there were no regional wallmotion abnormalities. Left ventricular diastolic functionparameters were normal.  - Mitral valve: There was mild regurgitation.  - Pericardium, extracardiac: A trivial pericardial effusion wasidentified. Features were not consistent with tamponadephysiology.   DVT Prophylaxis  :   Heparin    Lab Results  Component Value Date   PLT 416* 08/20/2014    Inpatient Medications  Scheduled Meds: . [MAR Hold] sodium chloride    Intravenous Once  . [MAR Hold] amLODipine  10 mg Oral Daily  . [MAR Hold]  ceFAZolin (ANCEF) IV  2 g Intravenous Q24H  . [MAR Hold] heparin  5,000 Units Subcutaneous 3 times per day  . [MAR Hold] insulin aspart  0-15 Units Subcutaneous TID WC  . [MAR Hold] insulin aspart  0-5 Units Subcutaneous QHS  . [MAR Hold] insulin aspart  2 Units Subcutaneous TID WC  . [MAR Hold] insulin glargine  15 Units Subcutaneous Daily  . [MAR Hold] levothyroxine  75 mcg Oral QAC breakfast  . [MAR Hold] metoprolol tartrate  50 mg Oral BID  . [MAR Hold] multivitamin with minerals  1 tablet Oral Daily  . [MAR Hold] senna-docusate  2 tablet Oral QHS  . [MAR Hold] sodium chloride  1,000 mL Intravenous Once  . [MAR Hold] sodium chloride  3 mL Intravenous Q12H   Continuous Infusions: . sodium chloride 10 mL/hr at 08/20/14 0850   PRN Meds:.[MAR Hold] acetaminophen **OR** [MAR Hold] acetaminophen, [facility]  Hold] alum & mag hydroxide-simeth, [MAR Hold] diphenhydrAMINE, [MAR Hold] hydrALAZINE, [MAR Hold] HYDROcodone-acetaminophen, [MAR Hold]  HYDROmorphone (DILAUDID) injection, [MAR Hold] ondansetron, [MAR Hold] polyethylene glycol, [MAR Hold] zolpidem  Antibiotics  :     Anti-infectives    Start     Dose/Rate Route Frequency Ordered Stop   08/17/14 1200  [MAR Hold]  ceFAZolin (ANCEF) IVPB 2 g/50 mL premix     (MAR Hold since 08/20/14 0837)   2 g 100 mL/hr over 30 Minutes Intravenous Every 24 hours 08/17/14 1003     08/15/14 1600  clindamycin (CLEOCIN) IVPB 900 mg  Status:  Discontinued     900 mg 100 mL/hr over 30 Minutes Intravenous Every 8 hours 08/15/14 1529 08/16/14 1559   08/15/14 1530  clindamycin (CLEOCIN) IVPB 300 mg  Status:  Discontinued     300 mg 100 mL/hr over 30 Minutes Intravenous 3 times per day 08/15/14 1522 08/15/14 1531   08/15/14 1430  aztreonam (AZACTAM) 500 mg in dextrose 5 % 50 mL IVPB  Status:  Discontinued     500 mg 100 mL/hr over 30 Minutes Intravenous Every 8 hours 08/15/14 1118 08/15/14  1522   08/11/14 2200  vancomycin (VANCOCIN) IVPB 1000 mg/200 mL premix  Status:  Discontinued     1,000 mg 200 mL/hr over 60 Minutes Intravenous Every 48 hours 08/09/14 2256 08/16/14 0716   08/10/14 1045  vancomycin (VANCOCIN) IVPB 750 mg/150 ml premix  Status:  Discontinued     750 mg 150 mL/hr over 60 Minutes Intravenous Every 12 hours 08/09/14 2252 08/09/14 2256   08/09/14 2230  aztreonam (AZACTAM) 1 g in dextrose 5 % 50 mL IVPB  Status:  Discontinued     1 g 100 mL/hr over 30 Minutes Intravenous Every 8 hours 08/09/14 2215 08/15/14 1118   08/09/14 2200  vancomycin (VANCOCIN) IVPB 1000 mg/200 mL premix  Status:  Discontinued     1,000 mg 200 mL/hr over 60 Minutes Intravenous Every 48 hours 08/09/14 2159 08/09/14 2252   08/09/14 2145  vancomycin (VANCOCIN) IVPB 1000 mg/200 mL premix  Status:  Discontinued     1,000 mg 200 mL/hr over 60 Minutes Intravenous  Once 08/09/14 2137 08/09/14 2147        Objective:   Filed Vitals:   08/19/14 2051 08/20/14 0453 08/20/14 0513 08/20/14 0852  BP: 140/67 137/60  158/74  Pulse: 87 87  88  Temp: 98.3 F (36.8 C) 99 F (37.2 C)    TempSrc: Oral Oral    Resp: 17 16    Height:      Weight:   87.5 kg (192 lb 14.4 oz)   SpO2: 91% 92%  96%    Wt Readings from Last 3 Encounters:  08/20/14 87.5 kg (192 lb 14.4 oz)  08/08/14 68.493 kg (151 lb)  08/05/14 68.493 kg (151 lb)     Intake/Output Summary (Last 24 hours) at 08/20/14 0948 Last data filed at 08/20/14 0610  Gross per 24 hour  Intake    460 ml  Output    300 ml  Net    160 ml     Physical Exam  Awake Alert, Oriented X 3, No new F.N deficits, Normal affect [place].AT,PERRAL Supple Neck,No JVD, No cervical lymphadenopathy appriciated.  Symmetrical Chest wall movement, Good air movement bilaterally, CTAB RRR,No Gallops,Rubs or new Murmurs, No Parasternal Heave +ve B.Sounds, Abd Soft, No tenderness, No organomegaly appriciated, No rebound - guarding or rigidity. No Cyanosis, Clubbing  or edema, No new Rash  or bruise  Swelling in the right arm and right thigh, some tenderness, no warmth, no signs of compartment syndrome    Data Review:   Micro Results Recent Results (from the past 240 hour(s))  Anaerobic culture     Status: None   Collection Time: 08/14/14  2:32 PM  Result Value Ref Range Status   Specimen Description FLUID  Final   Special Requests RIGHT ARM FLUID A  Final   Gram Stain   Final    MODERATE WBC PRESENT,BOTH PMN AND MONONUCLEAR NO SQUAMOUS EPITHELIAL CELLS SEEN RARE GRAM POSITIVE COCCI IN PAIRS IN CLUSTERS Performed at [facility]    Culture   Final    NO ANAEROBES ISOLATED Performed at [facility]    Report Status 08/19/2014 FINAL  Final  Culture, routine-abscess     Status: None   Collection Time: 08/14/14  2:32 PM  Result Value Ref Range Status   Specimen Description ARM RIGHT  Final   Special Requests NONE  Final   Gram Stain   Final    FEW WBC PRESENT, PREDOMINANTLY PMN NO SQUAMOUS EPITHELIAL CELLS SEEN RARE GRAM POSITIVE COCCI IN CLUSTERS Performed at [facility]    Culture   Final    FEW STAPHYLOCOCCUS AUREUS Note: RIFAMPIN AND GENTAMICIN SHOULD NOT BE USED AS SINGLE DRUGS FOR TREATMENT OF STAPH INFECTIONS. Performed at [facility]    Report Status 08/17/2014 FINAL  Final   Organism ID, Bacteria STAPHYLOCOCCUS AUREUS  Final      Susceptibility   Staphylococcus aureus - MIC*    CLINDAMYCIN <=0.25 SENSITIVE Sensitive     ERYTHROMYCIN <=0.25 SENSITIVE Sensitive     GENTAMICIN <=0.5 SENSITIVE Sensitive     LEVOFLOXACIN 0.25 SENSITIVE Sensitive     OXACILLIN <=0.25 SENSITIVE Sensitive     PENICILLIN 0.06 SENSITIVE Sensitive     RIFAMPIN <=0.5 SENSITIVE Sensitive     TRIMETH/SULFA <=10 SENSITIVE Sensitive     VANCOMYCIN 1 SENSITIVE Sensitive     TETRACYCLINE <=1 SENSITIVE Sensitive     MOXIFLOXACIN <=0.25 SENSITIVE Sensitive     * FEW STAPHYLOCOCCUS AUREUS  Anaerobic culture     Status:  None   Collection Time: 08/14/14  2:36 PM  Result Value Ref Range Status   Specimen Description TISSUE  Final   Special Requests RIGHT ARM B  Final   Gram Stain   Final    NO WBC SEEN NO SQUAMOUS EPITHELIAL CELLS SEEN RARE GRAM POSITIVE COCCI IN PAIRS IN CLUSTERS Performed at [facility]    Culture   Final    NO ANAEROBES ISOLATED Performed at [facility]    Report Status 08/19/2014 FINAL  Final  Tissue culture     Status: None   Collection Time: 08/14/14  2:36 PM  Result Value Ref Range Status   Specimen Description TISSUE  Final   Special Requests RIGHT ARM B  Final   Gram Stain   Final    RARE WBC PRESENT, PREDOMINANTLY PMN NO SQUAMOUS EPITHELIAL CELLS SEEN RARE GRAM POSITIVE COCCI IN PAIRS Performed at [facility]    Culture   Final    MODERATE STAPHYLOCOCCUS AUREUS Note: RIFAMPIN AND GENTAMICIN SHOULD NOT BE USED AS SINGLE DRUGS FOR TREATMENT OF STAPH INFECTIONS. Performed at [facility]    Report Status 08/17/2014 FINAL  Final   Organism ID, Bacteria STAPHYLOCOCCUS AUREUS  Final      Susceptibility   Staphylococcus aureus - MIC*  CLINDAMYCIN <=0.25 SENSITIVE Sensitive     ERYTHROMYCIN <=0.25 SENSITIVE Sensitive     GENTAMICIN <=0.5 SENSITIVE Sensitive     LEVOFLOXACIN 0.25 SENSITIVE Sensitive     OXACILLIN <=0.25 SENSITIVE Sensitive     PENICILLIN 0.06 SENSITIVE Sensitive     RIFAMPIN <=0.5 SENSITIVE Sensitive     TRIMETH/SULFA <=10 SENSITIVE Sensitive     VANCOMYCIN 1 SENSITIVE Sensitive     TETRACYCLINE <=1 SENSITIVE Sensitive     MOXIFLOXACIN <=0.25 SENSITIVE Sensitive     * MODERATE STAPHYLOCOCCUS AUREUS  Culture, routine-abscess     Status: None (Preliminary result)   Collection Time: 08/17/14  3:38 PM  Result Value Ref Range Status   Specimen Description ABSCESS RIGHT THIGH  Final   Special Requests Normal  Final   Gram Stain   Final    FEW WBC PRESENT, PREDOMINANTLY MONONUCLEAR NO SQUAMOUS EPITHELIAL CELLS  SEEN MODERATE GRAM POSITIVE COCCI IN PAIRS IN CLUSTERS Performed at [facility]    Culture   Final    Culture reincubated for better growth Performed at [facility]    Report Status PENDING  Incomplete  Anaerobic culture     Status: None (Preliminary result)   Collection Time: 08/17/14  3:38 PM  Result Value Ref Range Status   Specimen Description ABSCESS RIGHT THIGH  Final   Special Requests Normal  Final   Gram Stain   Final    FEW WBC PRESENT, PREDOMINANTLY MONONUCLEAR NO SQUAMOUS EPITHELIAL CELLS SEEN MODERATE GRAM POSITIVE COCCI IN PAIRS IN CLUSTERS Performed at [facility]    Culture   Final    NO ANAEROBES ISOLATED; CULTURE IN PROGRESS FOR 5 DAYS Performed at [facility]    Report Status PENDING  Incomplete    Radiology Reports Dg Elbow Complete Right  08/05/2014   CLINICAL DATA:  Right elbow pain that radiates to the shoulder. Pain started after picking up horse feed.  EXAM: RIGHT ELBOW - COMPLETE 3+ VIEW  COMPARISON:  None.  FINDINGS: There is no evidence of fracture, dislocation, or joint effusion. There is no evidence of arthropathy or other focal bone abnormality. Soft tissues are unremarkable.  IMPRESSION: Negative.   Electronically Signed   By: Markus Daft M.D.   On: 08/05/2014 08:05   Mr Humerus Right Wo Contrast  08/12/2014   CLINICAL DATA:  Myositis in the right arm.  Injury 2 weeks ago.  EXAM: MRI OF THE RIGHT HUMERUS WITHOUT CONTRAST  TECHNIQUE: Multiplanar, multisequence MR imaging was performed. No intravenous contrast was administered.  COMPARISON:  08/10/2014  FINDINGS: Dependent atelectasis in the right lower lobe.  There is considerable abnormal edema in the lateral, long, and medial head of the triceps diffusely. The internal tendon of the medial head seems torn on images 20 through 23 of series 7, with an associated 2.2 by 0.6 by 1.4 cm fluid collection along the proximal margin of the tendon.  There is edema in the  coracobrachialis muscle tendon a small medial portion of the brachialis muscle.  Left axillary lymph nodes measure up to 1.2 cm in short axis. Edema tracks along the axillary neurovascular structures.  Fluid signal tracks superficial to the posterior deltoid muscle and superficial to the latissimus dorsi and infraspinatus and teres minor muscles.  Subcutaneous edema tracks in the upper arm circumferentially, somewhat more confluent anteriorly.  Small elbow joint effusion.  No significant abnormal osseous edema.  IMPRESSION: 1. Again observed is extensive myositis primarily involving the triceps and coracobrachialis muscles,  with a small intrasubstance fluid collection along the proximal tendinous margin of the medial head of the triceps which may represent musculotendinous tear or less likely a very small abscess. This collection is similar to prior. Possibilities include posttraumatic myositis, infection, or a vascular etiology. Mild reactive axillary adenopathy on the right may suggest an infectious component. 2. Subcutaneous edema in the upper arm, mildly increased. Cellulitis not excluded. 3. Edema tracks along the superficial margin of the deltoid muscle, teres minor, and infraspinatus as well as the latissimus dorsi. This may be incidental but shear injury cannot be excluded.   Electronically Signed   By: Van Clines M.D.   On: 08/12/2014 08:46   Mr Humerus Right Wo Contrast  08/10/2014   CLINICAL DATA:  Right arm pain after an injury throwing a bag of horse feed 2 weeks ago.  EXAM: MRI OF THE RIGHT HUMERUS WITHOUT CONTRAST  TECHNIQUE: Multiplanar, multisequence MR imaging was performed. No intravenous contrast was administered.  COMPARISON:  MRI dated 08/08/2014 and radiograph dated 05/05/2014  FINDINGS: The edema in the right triceps muscle is even more extensive than on the prior study. The muscle is larger than on the prior study. There is only slight sparing of the proximal aspect of the muscle.  Subcutaneous edema is more prominent. The underlying humerus and the neurovascular bundle are normal. The focal area of abnormal signal at the distal mild tendinous junction is unchanged.  IMPRESSION: Progressive edema of the triceps muscle consistent with myositis. Focal area of abnormal signal at the mild tendinous junction could represent a tear or small abscess but this is not progressed. Given the patient's elevated white blood count, infectious myositis must be considered.   Electronically Signed   By: Lorriane Shire M.D.   On: 08/10/2014 08:50   Mr Humerus Right Wo Contrast  08/09/2014   CLINICAL DATA:  Pain throughout entire right humerus for 2 weeks.  EXAM: MRI OF THE RIGHT HUMERUS WITHOUT CONTRAST  TECHNIQUE: Multiplanar, multisequence MR imaging was performed. No intravenous contrast was administered.  COMPARISON:  None.  FINDINGS: No marrow signal abnormality. No fracture or dislocation. There is no bone destruction or periosteal reaction.  There is severe edema within the right triceps muscle with small areas of sparing proximally. At the distal musculotendinous junction of the medial head of the triceps, there is a complex 2 x 2 x 2.3 cm T2 signal abnormality with central T2 hyperintensity and peripheral intermediate signal. There is mild edema in the adjacent subcutaneous fat.  The biceps musculature is normal. There is no other fluid collection or soft tissue mass. The neurovascular bundles are grossly normal. There are multiple prominent right axillary lymph nodes.  IMPRESSION: 1. Severe edema within the right triceps muscle with small areas of sparing proximally. At the distal musculotendinous junction of the medial head of the triceps, there is a complex 2 x 2 x 2.3 cm T2 signal abnormality. The overall appearance is concerning for myositis which may be secondary to an infectious or inflammatory etiology. The 2 cm area of focal abnormal signal which may reflect a hematoma secondary to a partial  tear given the location is at the musculotendinous junction of the medial head of the triceps, but given the extensiveness of the muscle edema, this may reflect a developing abscess versus myonecrosis.   Electronically Signed   By: Kathreen Devoid   On: 08/09/2014 08:31   US Renal  08/10/2014   CLINICAL DATA:  Acute kidney injury.  Elevated creatinine  level.  EXAM: RENAL / URINARY TRACT ULTRASOUND COMPLETE  COMPARISON:  CT examination 03/09/2013 and ultrasound 10/04/2008  FINDINGS: Right Kidney:  Length: 10.6 cm. The right kidney parenchyma is echogenic without hydronephrosis. There is a heterogeneous hypoechoic structure along the upper pole measuring up to 1.1 cm. Previously, the patient had an abscess in the right kidney and unclear if this sequela from the abscess or a new small complex lesion.  Left Kidney:  Length: 10.6 cm. Left kidney is not well visualized but appears to be echogenic. Negative for hydronephrosis.  Bladder:  Appears normal for degree of bladder distention.  IMPRESSION: Negative for hydronephrosis.  Increased echogenicity of both kidneys. Findings raise concern for chronic medical renal disease.  There is a 1.1 cm heterogeneous hypoechoic structure in the right kidney upper pole. Unclear if this is the sequelae of previous renal abscess versus new small complex cystic lesion. Evaluation of this area will be difficult due to acute kidney injury and an elevated creatinine level. However, this may be better characterized with a non contrast MRI versus close surveillance with ultrasound.   Electronically Signed   By: Markus Daft M.D.   On: 08/10/2014 11:34   Ct Femur Right Wo Contrast  08/10/2014   CLINICAL DATA:  Myositis. Sudden onset of pain while lifting a feed bag 2 weeks ago.  EXAM: CT OF THE LOWER RIGHT EXTREMITY WITHOUT CONTRAST  TECHNIQUE: Multidetector CT imaging of the right femur and right tibia fibula was performed according to the standard protocol.  COMPARISON:  None.  FINDINGS:  There is no bony abnormality. There is no radiopaque foreign body. There is no soft tissue mass. There is no abscess or drainable fluid collection. There is no soft tissue gas.  There is soft tissue edema around the muscles of the right lower extremity, particularly around the adductor of the via and the quadriceps musculature. This is consistent with the clinically described suspicion of myositis. There is a small knee joint effusion.  IMPRESSION: Soft tissue edema surrounding the musculature of the right lower extremity consistent with myositis. No mass or drainable fluid collection.   Electronically Signed   By: Andreas Newport M.D.   On: 08/10/2014 03:49   Ct Tibia Fibula Right Wo Contrast  08/10/2014   CLINICAL DATA:  Myositis. Sudden onset of pain while lifting a feed bag 2 weeks ago.  EXAM: CT OF THE LOWER RIGHT EXTREMITY WITHOUT CONTRAST  TECHNIQUE: Multidetector CT imaging of the right femur and right tibia fibula was performed according to the standard protocol.  COMPARISON:  None.  FINDINGS: There is no bony abnormality. There is no radiopaque foreign body. There is no soft tissue mass. There is no abscess or drainable fluid collection. There is no soft tissue gas.  There is soft tissue edema around the muscles of the right lower extremity, particularly around the adductor of the via and the quadriceps musculature. This is consistent with the clinically described suspicion of myositis. There is a small knee joint effusion.  IMPRESSION: Soft tissue edema surrounding the musculature of the right lower extremity consistent with myositis. No mass or drainable fluid collection.   Electronically Signed   By: Andreas Newport M.D.   On: 08/10/2014 03:49   Mr Femur Right Wo Contrast  08/12/2014   CLINICAL DATA:  Myositis of the right arm and right thigh. Status post physical strain 2 weeks ago.  EXAM: MRI OF THE RIGHT FEMUR WITHOUT CONTRAST  TECHNIQUE: Multiplanar, multisequence MR imaging of the right  femur was performed. No intravenous contrast was administered.  COMPARISON:  CT right femur 08/10/2014  FINDINGS: There is severe muscle edema within the vastus medialis and intermedius muscle. There is a 14 mm more focal area of fluid signal within the vastus medialis which may reflect a more focal edema versus a small fluid collection. There is mild muscle edema within the vastus lateralis and rectus femoris. There is severe muscle edema within the sartorius muscle. There is a small amount of fluid along the fascia of the flexor compartment. There is small amount of fluid along the fascia of the flexor compartment and around the adductor compartment. There is no drainable focal fluid collection. There is no hematoma.  There is no focal marrow signal abnormality. There is no fracture or subluxation. No periosteal reaction.  IMPRESSION: 1. Severe muscle edema within the vastus medialis, vastus intermedius and sartorius muscles most consistent with myositis secondary to an infectious or inflammatory etiology. 14 mm more focal area of fluid signal within the vastus medialis which may reflect a more focal edema versus a small fluid collection. Low level edema within the vastus lateralis and rectus femoris likely reflecting myositis to a much lesser degree.   Electronically Signed   By: Kathreen Devoid   On: 08/12/2014 08:57   US Aspiration  08/17/2014   CLINICAL DATA:  Acute infectious myositis  EXAM: ULTRASOUND RIGHT THIGH intramuscular ABSCESS NEEDLE ASPIRATION  Date:  7/6/20167/07/2014 3:41 pm  Radiologist:  M. Daryll Brod, MD  Guidance:  Ultrasound  FLUOROSCOPY TIME:  None.  MEDICATIONS AND MEDICAL HISTORY: 1.5 mg Versed, 50 mcg fentanyl  ANESTHESIA/SEDATION: 15 minutes  CONTRAST:  None  COMPLICATIONS: None  PROCEDURE: Informed consent was obtained from the patient following explanation of the procedure, risks, benefits and alternatives. The patient understands, agrees and consents for the procedure. All questions  were addressed. A time out was performed.  Maximal barrier sterile technique utilized including caps, mask, sterile gowns, sterile gloves, large sterile drape, hand hygiene, and ChloraPrep.  Previous MRI was reviewed. Preliminary ultrasound performed of the right thigh. A heterogeneous complex appearing fluid collection is identified in the right vastus medialis muscle. This correlates with the MRI finding.  Under sterile conditions and local anesthesia, an 18 gauge access needle was advanced percutaneously under direct ultrasound into the intramuscular complex fluid collection. Syringe aspiration yielded 3 cc purulent fluid compatible with an intramuscular abscess. Sample sent for Gram stain and culture. Needle removed. No immediate complication.  IMPRESSION: Successful ultrasound aspiration of a right thigh vastus medialis intramuscular abscess. Gram stain and culture sent.   Electronically Signed   By: Jerilynn Mages.  Shick M.D.   On: 08/17/2014 15:50   Ir Fluoro Guide Cv Line Right  08/15/2014   CLINICAL DATA:  Diabetes, acute renal insufficiency  EXAM: EXAM RIGHT IJ CATHETER PLACEMENT UNDER ULTRASOUND AND FLUOROSCOPIC GUIDANCE  TECHNIQUE: The procedure, risks (including but not limited to bleeding, infection, organ damage, pneumothorax), benefits, and alternatives were explained to the patient. Questions regarding the procedure were encouraged and answered. The patient understands and consents to the procedure. Patency of the right IJ vein was confirmed with ultrasound with image documentation. An appropriate skin site was determined. Skin site was marked. Region was prepped using maximum barrier technique including cap and mask, sterile gown, sterile gloves, large sterile sheet, and Chlorhexidine as cutaneous antisepsis. The region was infiltrated locally with 1% lidocaine. Under real-time ultrasound guidance, the right IJ vein was accessed with a 19 gauge needle; the needle tip  within the vein was confirmed with  ultrasound image documentation. The needle exchanged over a guidewire for vascular dilator which allowed advancement of a 20 cm Trialysis catheter. This was positioned with the tip at the cavoatrial junction. Spot chest radiograph shows good positioning and no pneumothorax. Catheter was flushed and sutured externally with 0-Prolene sutures. Patient tolerated the procedure well.  FLUOROSCOPY TIME:  1.1 mGy, 6 seconds  COMPLICATIONS: COMPLICATIONS none  IMPRESSION: 1. Technically successful right IJ Trialysis catheter placement.   Electronically Signed   By: Lucrezia Europe M.D.   On: 08/15/2014 11:00   Ir US Guide Vasc Access Right  08/15/2014   CLINICAL DATA:  Diabetes, acute renal insufficiency  EXAM: EXAM RIGHT IJ CATHETER PLACEMENT UNDER ULTRASOUND AND FLUOROSCOPIC GUIDANCE  TECHNIQUE: The procedure, risks (including but not limited to bleeding, infection, organ damage, pneumothorax), benefits, and alternatives were explained to the patient. Questions regarding the procedure were encouraged and answered. The patient understands and consents to the procedure. Patency of the right IJ vein was confirmed with ultrasound with image documentation. An appropriate skin site was determined. Skin site was marked. Region was prepped using maximum barrier technique including cap and mask, sterile gown, sterile gloves, large sterile sheet, and Chlorhexidine as cutaneous antisepsis. The region was infiltrated locally with 1% lidocaine. Under real-time ultrasound guidance, the right IJ vein was accessed with a 19 gauge needle; the needle tip within the vein was confirmed with ultrasound image documentation. The needle exchanged over a guidewire for vascular dilator which allowed advancement of a 20 cm Trialysis catheter. This was positioned with the tip at the cavoatrial junction. Spot chest radiograph shows good positioning and no pneumothorax. Catheter was flushed and sutured externally with 0-Prolene sutures. Patient tolerated  the procedure well.  FLUOROSCOPY TIME:  1.1 mGy, 6 seconds  COMPLICATIONS: COMPLICATIONS none  IMPRESSION: 1. Technically successful right IJ Trialysis catheter placement.   Electronically Signed   By: Lucrezia Europe M.D.   On: 08/15/2014 11:00   Dg Femur, Min 2 Views Right  08/10/2014   CLINICAL DATA:  Right thigh pain. Concern for myositis. Rule out soft tissue air.  EXAM: RIGHT FEMUR 2 VIEWS  COMPARISON:  None.  FINDINGS: Cortical margins of the right femur are intact. No fracture, focal osseous lesion, erosion or periosteal reaction. No soft tissue air or radiopaque foreign body. Questionable soft tissue edema proximally.  IMPRESSION: Suspect mild soft tissue edema proximally. No soft tissue air, radiopaque foreign body, or acute osseous abnormality.   Electronically Signed   By: Jeb Levering M.D.   On: 08/10/2014 01:48     CBC  Recent Labs Lab 08/16/14 0634 08/17/14 0530 08/18/14 0916 08/19/14 1436 08/20/14 0527  WBC 25.5* 20.8* 21.6* 19.9* 17.3*  HGB 7.0* 6.9* 9.4* 8.9* 8.4*  HCT 20.9* 20.8* 28.2* 27.1* 25.8*  PLT 361 379 477* 449* 416*  MCV 83.3 83.2 84.9 85.0 86.3  MCH 27.9 27.6 28.3 27.9 28.1  MCHC 33.5 33.2 33.3 32.8 32.6  RDW 14.4 14.2 14.5 14.7 15.1    Chemistries   Recent Labs Lab 08/14/14 0846 08/16/14 0634 08/18/14 1508 08/19/14 1436 08/20/14 0527  NA 132* 132* 136 135 134*  K 4.3 4.6 3.6 3.3* 3.5  CL 104 104 100* 99* 99*  CO2 17* 15* 22 25 25   GLUCOSE 103* 89 253* 141* 181*  BUN 58* 69* 35* 35* 35*  CREATININE 7.33* 8.65* 5.11* 5.29* 5.71*  CALCIUM 8.7* 7.6* 8.2* 8.0* 7.9*   ------------------------------------------------------------------------------------------------------------------ estimated creatinine clearance is  15.1 mL/min (by C-G formula based on Cr of 5.71). ------------------------------------------------------------------------------------------------------------------ No results for input(s): HGBA1C in the last 72  hours. ------------------------------------------------------------------------------------------------------------------ No results for input(s): CHOL, HDL, LDLCALC, TRIG, CHOLHDL, LDLDIRECT in the last 72 hours. ------------------------------------------------------------------------------------------------------------------  Recent Labs  08/19/14 1436  TSH 15.859*   ------------------------------------------------------------------------------------------------------------------ No results for input(s): VITAMINB12, FOLATE, FERRITIN, TIBC, IRON, RETICCTPCT in the last 72 hours.  Coagulation profile  Recent Labs Lab 08/16/14 0635  INR 1.40    No results for input(s): DDIMER in the last 72 hours.  Cardiac Enzymes No results for input(s): CKMB, TROPONINI, MYOGLOBIN in the last 168 hours.  Invalid input(s): CK ------------------------------------------------------------------------------------------------------------------ Invalid input(s): POCBNP   Time Spent in minutes  35   Sanna Porcaro K M.D on 08/20/2014 at 9:48 AM  Between 7am to 7pm - Pager - [phone]  After 7pm go to [email or web address] - password [email or web address]  [name]   Office  [phone]

## 2014-08-20 NOTE — Anesthesia Procedure Notes (Signed)
Procedure Name: LMA Insertion Date/Time: 08/20/2014 9:45 AM Performed by: Scheryl Darter Pre-anesthesia Checklist: Patient identified, Emergency Drugs available, Suction available, Patient being monitored and Timeout performed Patient Re-evaluated:Patient Re-evaluated prior to inductionOxygen Delivery Method: Circle system utilized Preoxygenation: Pre-oxygenation with 100% oxygen Intubation Type: IV induction Ventilation: Mask ventilation without difficulty LMA Size: 4.0 Number of attempts: 1 Placement Confirmation: positive ETCO2 and breath sounds checked- equal and bilateral Tube secured with: Tape Dental Injury: Teeth and Oropharynx as per pre-operative assessment

## 2014-08-20 NOTE — Transfer of Care (Signed)
Immediate Anesthesia Transfer of Care Note  Patient: Tracy [name]  Procedure(s) Performed: Procedure(s):  I AND D LEG / THIGH ABSCESS AND MANIPULATION OF RIGHT ELBOW. (Right)  Patient Location: PACU  Anesthesia Type:General  Level of Consciousness: awake, alert , oriented and sedated  Airway & Oxygen Therapy: Patient Spontanous Breathing and Patient connected to nasal cannula oxygen  Post-op Assessment: Report given to RN, Post -op Vital signs reviewed and stable and Patient moving all extremities  Post vital signs: Reviewed and stable  Last Vitals:  Filed Vitals:   08/20/14 0852  BP: 158/74  Pulse: 88  Temp:   Resp:     Complications: No apparent anesthesia complications

## 2014-08-21 ENCOUNTER — Inpatient Hospital Stay (HOSPITAL_COMMUNITY): Payer: BLUE CROSS/BLUE SHIELD

## 2014-08-21 DIAGNOSIS — J9601 Acute respiratory failure with hypoxia: Secondary | ICD-10-CM | POA: Clinically undetermined

## 2014-08-21 LAB — CBC
HEMATOCRIT: 22.7 % — AB (ref 36.0–46.0)
HEMOGLOBIN: 7.5 g/dL — AB (ref 12.0–15.0)
MCH: 29.1 pg (ref 26.0–34.0)
MCHC: 33 g/dL (ref 30.0–36.0)
MCV: 88 fL (ref 78.0–100.0)
Platelets: 389 10*3/uL (ref 150–400)
RBC: 2.58 MIL/uL — AB (ref 3.87–5.11)
RDW: 15.4 % (ref 11.5–15.5)
WBC: 15.6 10*3/uL — ABNORMAL HIGH (ref 4.0–10.5)

## 2014-08-21 LAB — CULTURE, ROUTINE-ABSCESS: SPECIAL REQUESTS: NORMAL

## 2014-08-21 LAB — GLUCOSE, CAPILLARY
GLUCOSE-CAPILLARY: 44 mg/dL — AB (ref 65–99)
Glucose-Capillary: 213 mg/dL — ABNORMAL HIGH (ref 65–99)
Glucose-Capillary: 133 mg/dL — ABNORMAL HIGH (ref 65–99)
Glucose-Capillary: 70 mg/dL (ref 65–99)
Glucose-Capillary: 91 mg/dL (ref 65–99)

## 2014-08-21 LAB — RENAL FUNCTION PANEL
ALBUMIN: 1.4 g/dL — AB (ref 3.5–5.0)
ANION GAP: 12 (ref 5–15)
BUN: 37 mg/dL — ABNORMAL HIGH (ref 6–20)
CHLORIDE: 100 mmol/L — AB (ref 101–111)
CO2: 22 mmol/L (ref 22–32)
Calcium: 7.8 mg/dL — ABNORMAL LOW (ref 8.9–10.3)
Creatinine, Ser: 5.91 mg/dL — ABNORMAL HIGH (ref 0.44–1.00)
GFR calc non Af Amer: 9 mL/min — ABNORMAL LOW (ref >60)
GFR, EST AFRICAN AMERICAN: 10 mL/min — AB (ref >60)
Glucose, Bld: 233 mg/dL — ABNORMAL HIGH (ref 65–99)
PHOSPHORUS: 4.5 mg/dL (ref 2.5–4.6)
POTASSIUM: 3.9 mmol/L (ref 3.5–5.1)
Sodium: 134 mmol/L — ABNORMAL LOW (ref 135–145)

## 2014-08-21 MED ORDER — SODIUM CHLORIDE 0.9 % IV SOLN
INTRAVENOUS | Status: DC
  Administered 2014-09-01: 14:00:00 via INTRAVENOUS

## 2014-08-21 MED ORDER — MORPHINE SULFATE 4 MG/ML IJ SOLN
4.0000 mg | INTRAMUSCULAR | Status: DC | PRN
  Administered 2014-08-21 – 2014-08-22 (×2): 4 mg via INTRAVENOUS
  Filled 2014-08-21 (×2): qty 1

## 2014-08-21 MED ORDER — HYDROMORPHONE HCL 1 MG/ML IJ SOLN
0.5000 mg | Freq: Once | INTRAMUSCULAR | Status: AC
  Administered 2014-08-21: 0.5 mg via INTRAVENOUS
  Filled 2014-08-21: qty 1

## 2014-08-21 MED ORDER — FUROSEMIDE 10 MG/ML IJ SOLN
INTRAMUSCULAR | Status: AC
Start: 2014-08-21 — End: 2014-08-22
  Filled 2014-08-21: qty 4

## 2014-08-21 MED ORDER — DARBEPOETIN ALFA 100 MCG/0.5ML IJ SOSY
100.0000 ug | PREFILLED_SYRINGE | INTRAMUSCULAR | Status: DC
  Administered 2014-08-21 – 2014-08-28 (×2): 100 ug via SUBCUTANEOUS
  Filled 2014-08-21 (×2): qty 0.5

## 2014-08-21 MED ORDER — FUROSEMIDE 10 MG/ML IJ SOLN
20.0000 mg | Freq: Two times a day (BID) | INTRAMUSCULAR | Status: DC
  Administered 2014-08-21 – 2014-08-22 (×2): 20 mg via INTRAVENOUS

## 2014-08-21 NOTE — Progress Notes (Signed)
Patient ID: Tracy [name], female   DOB: 01-21-1986, 29 y.o.   MRN: [id number]  [facility] Progress Note    Assessment/ Plan:   1. Acute renal failure on chronic kidney disease stage IV: Suspected to be hemodynamically mediated ATN and NSAIDs. Urine output not accurately charted overnight but after discussion with the patient/her sister who is at bedside it appears that she put out at least 1200 mL overnight. She is status post 2 dialysis treatments last week for uremic nausea/fatigue (on 7/5 and 7/6). Ongoing monitoring for renal recovery/continued dialysis needs-possible progression to ESRD. Currently, without any acute dialysis needs-no acute electrolyte abnormalities/uremic symptoms noted. If dialysis is needed chronically, she will need conversion of her temporary dialysis catheter to permanent catheter and evaluation/placement of permanent dialysis access (would not offer her PD with her hemoglobin A1c of 9%)  2. Pyomyositis status post incision and drainage of right arm with indwelling drain as well as right thigh/leg yesterday with indwelling drain. Remains on focal antibiotic therapy for MSSA with cefazolin. 3. Hyponatremia: Associated with acute renal failure/defective free water handling as well as concomitant hyperglycemia with pseudohyponatremia  4. Anemia: Secondary to infection/ESA resistance/surgical losses. Decreasing hemoglobin noted-with her ongoing infection would avoid intravenous iron. We'll give Aranesp today.  Subjective:    Improved right thigh pain overnight-  she denies any nausea, vomiting. Denies any jerky limb movements and family at bedside deny any confusion.    Objective:   BP 140/69 mmHg  Pulse 86  Temp(Src) 98.4 F (36.9 C) (Oral)  Resp 18  Ht 5\' 3"  (1.6 m)  Wt 87.5 kg (192 lb 14.4 oz)  BMI 34.18 kg/m2  SpO2 91%  LMP 07/18/2014  Intake/Output Summary (Last 24 hours) at 08/21/14 1026 Last data filed at 08/21/14 1000  Gross per 24 hour   Intake   1280 ml  Output     26 ml  Net   1254 ml   Weight change:   Physical Exam: [id number] resting in recliner- father and sister CVS: Pulse RRR, normal s1 and s2 Resp:CTA bilaterally, no rales/rhonchi [id number], flat, NT, BS normal Ext: Trace LE edema with dressing/drain right arm as well as right thigh  Imaging: No results found.  Labs: BMET  Recent Labs Lab 08/16/14 0634 08/18/14 1508 08/19/14 1436 08/20/14 0527 08/21/14 0500  NA 132* 136 135 134* 134*  K 4.6 3.6 3.3* 3.5 3.9  CL 104 100* 99* 99* 100*  CO2 15* 22 25 25 22   GLUCOSE 89 253* 141* 181* 233*  BUN 69* 35* 35* 35* 37*  CREATININE 8.65* 5.11* 5.29* 5.71* 5.91*  CALCIUM 7.6* 8.2* 8.0* 7.9* 7.8*  PHOS  --  3.7 3.9 4.2 4.5   CBC  Recent Labs Lab 08/18/14 0916 08/19/14 1436 08/20/14 0527 08/21/14 0500  WBC 21.6* 19.9* 17.3* 15.6*  HGB 9.4* 8.9* 8.4* 7.5*  HCT 28.2* 27.1* 25.8* 22.7*  MCV 84.9 85.0 86.3 88.0  PLT 477* 449* 416* 389    Medications:    . sodium chloride   Intravenous Once  . amLODipine  10 mg Oral Daily  .  ceFAZolin (ANCEF) IV  2 g Intravenous Q24H  . heparin  5,000 Units Subcutaneous 3 times per day  . insulin aspart  0-15 Units Subcutaneous TID WC  . insulin aspart  0-5 Units Subcutaneous QHS  . insulin aspart  2 Units Subcutaneous TID WC  . insulin glargine  15 Units Subcutaneous Daily  . levothyroxine  100 mcg Oral QAC breakfast  .  metoprolol tartrate  50 mg Oral BID  . multivitamin with minerals  1 tablet Oral Daily  . senna-docusate  2 tablet Oral QHS  . sodium chloride  1,000 mL Intravenous Once  . sodium chloride  3 mL Intravenous Q12H   Elmarie Shiley, MD 08/21/2014, 10:26 AM

## 2014-08-21 NOTE — Progress Notes (Signed)
Overall Tracy [name] is feeling better today. She has gotten out of bed and walked on her leg it is sore but she is not feeling the same pressure she fell prior to surgery yesterday.  Her right upper extremity continues to appear benign she is distally neurovascularly intact.  Her right lower extremity dressings are clean dry and intact there is no drainage and her drain there is roughly 10 mL and her wound VAC distally she is neurovascularly intact and her compartments are soft.  Overall she appears to be doing well if her drain continues to be dry we will pull that tomorrow and change her dressing. We could likely do a wound VAC change tomorrow afternoon and if her wound is dry could DC her wound VAC. She should continue to mobilize with PT. If all things are to improve at this point we could look towards DC.   Tracy [name] D

## 2014-08-21 NOTE — Progress Notes (Signed)
PROGRESS NOTE  Tracy [name] [id number] DOB: 15-Sep-1985 DOA: 08/09/2014 PCP: [name]  HPI/Recap of past 24 hours: This is a 29 year old Caucasian female with history of diabetes mellitus on insulin pump but poor control, hypothyroidism poor compliance she stopped taking her Synthroid 2 months ago for no known reason, chronic kidney disease stage IV baseline creatinine close to 3, who was admitted to the hospital 11 days ago for 2 week history of right arm and right leg pain, upon further workup with MRI was found that she had extensive myositis in both right-sided extremities. She was initially seen at [facility] in [place] and then transferred to [facility].  She was seen here by hand surgery, general surgery, orthopedic surgery, renal, infectious disease, phone consultation was made by me with rheumatologist Dr. Amil Amen. Initially no clear reason of myositis could be found, however since her clinical course did not show any improvement she was taken to the OR first by Dr. Amedeo Plenty about 5 days ago and right arm fluid was drained which was suggestive of infectious myositis, she has been on IV antibiotics throughout her hospital stay. On 08/20/2014 she returned to the OR again by orthopedic surgery for drainage of right leg fluid collection. Her workup certainly suggests infectious myositis however reason for her bacteremia is not clear at all. ID is on board as well.  Note due to her illness patient's has progressed from chronic kidney disease stage IV to close to ESRD, currently under dialysis for the last 7 days. Renal to decide whether this is ESRD or not.  In addition, nursing noted on 7/10 morning that patient is hypoxic off of oxygen and currently requiring 3 L to say above 90% patient has a mild nonproductive cough and some low-grade temperatures. Otherwise she feels a little sore but better overall.  Assessment/Plan: Principal Problem:   Myositis: Possibly from  post physical string 2 weeks prior from throwing horse feed along with muscle injury with secondary infection. Status post right arm muscle biopsy along with fluid drainage and JP drain as well as right leg ET guided drainage. Source of bacteremia unclear. Echocardiogram normal.  Active Problems:   Proliferative diabetic retinopathy   Hypothyroidism: We started on Synthroid after noncompliance.    Normocytic anemia: Iron deficiency and also in part from renal failure and chronic disease. Status post 2 units packed red blood cells transfusion on 7/6    Acute renal failure superimposed on stage 4 chronic kidney disease: Previous creatinine baseline at 3. Possibly from hypovolemia from nausea and vomiting plus NSAID use prior to admission causing ATN. Voiding effort toxins. Hydrating and renal ultrasound noting cyst but no obstruction. Urine output stable. Followed by nephrology. Interventional radiology placed ounces catheter on 7/4 and patient underwent dialysis on 7/5    DM I (diabetes mellitus, type I), uncontrolled with renal disease: Poor outpatient control. A1c of 8.9. Currently on Lantus plus sliding scale.    Essential hypertension   Acute respiratory failure with hypoxia: Unclear etiology. Continue oxygen. Could be pneumonia versus volume overload needing more dialysis versus medication suppressing respiratory drive.    Code Status: Full code  Family Communication: Dad and sister at the bedside  Disposition Plan: Continue inpatient until infection thought to be fully treated/hypoxia source discovered.   Consultants:  Case discussed with rheumatology by phone.  Interventional radiology.  Infectious disease.  Nephrology  General surgery  Hand surgery  Orthopedic surgery  Procedures:  Right arm muscle biopsy plus fluid drainage and  JP drain placement of right arm done 7/3  Right IJ dialysis catheter by interventional radiology 7/4  Dialysis session starting  7/5  Right leg ultrasound guided aspiration by interventional radiology 7/6  Leg expiration with drainage of fluid done 7/9  Transfusion 2 units packed red blood cells then 7/6  Antibiotics:  IV vancomycin 6/28-7/5  IV Azactam 6/28-7/4  IV clindamycin 7/4-7/5  IV Ancef 7/6-7/9   Objective: BP 133/64 mmHg  Pulse 86  Temp(Src) 98.3 F (36.8 C) (Oral)  Resp 18  Ht 5\' 3"  (1.6 m)  Wt 87.5 kg (192 lb 14.4 oz)  BMI 34.18 kg/m2  SpO2 95%  LMP 07/18/2014  Intake/Output Summary (Last 24 hours) at 08/21/14 1344 Last data filed at 08/21/14 1315  Gross per 24 hour  Intake    780 ml  Output    301 ml  Net    479 ml   Filed Weights   08/17/14 2033 08/18/14 0535 08/20/14 0513  Weight: 85.7 kg (188 lb 15 oz) 87.3 kg (192 lb 7.4 oz) 87.5 kg (192 lb 14.4 oz)    Exam:   General:  Alert and oriented 3, no acute distress  Cardiovascular: Regular rate and rhythm, S1-S2  Respiratory: Clear to auscultation bilaterally  Abdomen: Soft, nontender, nondistended, positive bowel sounds  Musculoskeletal: No clubbing or cyanosis, trace edema. Left lower extremity wrapped   Data Reviewed: Basic Metabolic Panel:  Recent Labs Lab 08/16/14 0634 08/18/14 1508 08/19/14 1436 08/20/14 0527 08/21/14 0500  NA 132* 136 135 134* 134*  K 4.6 3.6 3.3* 3.5 3.9  CL 104 100* 99* 99* 100*  CO2 15* 22 25 25 22   GLUCOSE 89 253* 141* 181* 233*  BUN 69* 35* 35* 35* 37*  CREATININE 8.65* 5.11* 5.29* 5.71* 5.91*  CALCIUM 7.6* 8.2* 8.0* 7.9* 7.8*  PHOS  --  3.7 3.9 4.2 4.5   Liver Function Tests:  Recent Labs Lab 08/18/14 1508 08/19/14 1436 08/20/14 0527 08/21/14 0500  ALBUMIN 1.6* 1.6* 1.5* 1.4*   No results for input(s): LIPASE, AMYLASE in the last 168 hours. No results for input(s): AMMONIA in the last 168 hours. CBC:  Recent Labs Lab 08/17/14 0530 08/18/14 0916 08/19/14 1436 08/20/14 0527 08/21/14 0500  WBC 20.8* 21.6* 19.9* 17.3* 15.6*  HGB 6.9* 9.4* 8.9* 8.4* 7.5*  HCT  20.8* 28.2* 27.1* 25.8* 22.7*  MCV 83.2 84.9 85.0 86.3 88.0  PLT 379 477* 449* 416* 389   Cardiac Enzymes:   No results for input(s): CKTOTAL, CKMB, CKMBINDEX, TROPONINI in the last 168 hours. BNP (last 3 results) No results for input(s): BNP in the last 8760 hours.  ProBNP (last 3 results) No results for input(s): PROBNP in the last 8760 hours.  CBG:  Recent Labs Lab 08/20/14 1122 08/20/14 1343 08/20/14 2136 08/21/14 0747 08/21/14 1121  GLUCAP 181* 189* 199* 213* 133*    Recent Results (from the past 240 hour(s))  Anaerobic culture     Status: None   Collection Time: 08/14/14  2:32 PM  Result Value Ref Range Status   Specimen Description FLUID  Final   Special Requests RIGHT ARM FLUID A  Final   Gram Stain   Final    MODERATE WBC PRESENT,BOTH PMN AND MONONUCLEAR NO SQUAMOUS EPITHELIAL CELLS SEEN RARE GRAM POSITIVE COCCI IN PAIRS IN CLUSTERS Performed at [facility]    Culture   Final    NO ANAEROBES ISOLATED Performed at [facility]    Report Status 08/19/2014 FINAL  Final  Culture, routine-abscess     Status: None   Collection Time: 08/14/14  2:32 PM  Result Value Ref Range Status   Specimen Description ARM RIGHT  Final   Special Requests NONE  Final   Gram Stain   Final    FEW WBC PRESENT, PREDOMINANTLY PMN NO SQUAMOUS EPITHELIAL CELLS SEEN RARE GRAM POSITIVE COCCI IN CLUSTERS Performed at [facility]    Culture   Final    FEW STAPHYLOCOCCUS AUREUS Note: RIFAMPIN AND GENTAMICIN SHOULD NOT BE USED AS SINGLE DRUGS FOR TREATMENT OF STAPH INFECTIONS. Performed at [facility]    Report Status 08/17/2014 FINAL  Final   Organism ID, Bacteria STAPHYLOCOCCUS AUREUS  Final      Susceptibility   Staphylococcus aureus - MIC*    CLINDAMYCIN <=0.25 SENSITIVE Sensitive     ERYTHROMYCIN <=0.25 SENSITIVE Sensitive     GENTAMICIN <=0.5 SENSITIVE Sensitive     LEVOFLOXACIN 0.25 SENSITIVE Sensitive     OXACILLIN <=0.25 SENSITIVE  Sensitive     PENICILLIN 0.06 SENSITIVE Sensitive     RIFAMPIN <=0.5 SENSITIVE Sensitive     TRIMETH/SULFA <=10 SENSITIVE Sensitive     VANCOMYCIN 1 SENSITIVE Sensitive     TETRACYCLINE <=1 SENSITIVE Sensitive     MOXIFLOXACIN <=0.25 SENSITIVE Sensitive     * FEW STAPHYLOCOCCUS AUREUS  Anaerobic culture     Status: None   Collection Time: 08/14/14  2:36 PM  Result Value Ref Range Status   Specimen Description TISSUE  Final   Special Requests RIGHT ARM B  Final   Gram Stain   Final    NO WBC SEEN NO SQUAMOUS EPITHELIAL CELLS SEEN RARE GRAM POSITIVE COCCI IN PAIRS IN CLUSTERS Performed at [facility]    Culture   Final    NO ANAEROBES ISOLATED Performed at [facility]    Report Status 08/19/2014 FINAL  Final  Tissue culture     Status: None   Collection Time: 08/14/14  2:36 PM  Result Value Ref Range Status   Specimen Description TISSUE  Final   Special Requests RIGHT ARM B  Final   Gram Stain   Final    RARE WBC PRESENT, PREDOMINANTLY PMN NO SQUAMOUS EPITHELIAL CELLS SEEN RARE GRAM POSITIVE COCCI IN PAIRS Performed at [facility]    Culture   Final    MODERATE STAPHYLOCOCCUS AUREUS Note: RIFAMPIN AND GENTAMICIN SHOULD NOT BE USED AS SINGLE DRUGS FOR TREATMENT OF STAPH INFECTIONS. Performed at [facility]    Report Status 08/17/2014 FINAL  Final   Organism ID, Bacteria STAPHYLOCOCCUS AUREUS  Final      Susceptibility   Staphylococcus aureus - MIC*    CLINDAMYCIN <=0.25 SENSITIVE Sensitive     ERYTHROMYCIN <=0.25 SENSITIVE Sensitive     GENTAMICIN <=0.5 SENSITIVE Sensitive     LEVOFLOXACIN 0.25 SENSITIVE Sensitive     OXACILLIN <=0.25 SENSITIVE Sensitive     PENICILLIN 0.06 SENSITIVE Sensitive     RIFAMPIN <=0.5 SENSITIVE Sensitive     TRIMETH/SULFA <=10 SENSITIVE Sensitive     VANCOMYCIN 1 SENSITIVE Sensitive     TETRACYCLINE <=1 SENSITIVE Sensitive     MOXIFLOXACIN <=0.25 SENSITIVE Sensitive     * MODERATE STAPHYLOCOCCUS  AUREUS  Culture, routine-abscess     Status: None   Collection Time: 08/17/14  3:38 PM  Result Value Ref Range Status   Specimen Description ABSCESS RIGHT THIGH  Final   Special Requests Normal  Final   Gram Stain  Final    FEW WBC PRESENT, PREDOMINANTLY MONONUCLEAR NO SQUAMOUS EPITHELIAL CELLS SEEN MODERATE GRAM POSITIVE COCCI IN PAIRS IN CLUSTERS Performed at [facility]    Culture   Final    ABUNDANT STAPHYLOCOCCUS AUREUS Note: RIFAMPIN AND GENTAMICIN SHOULD NOT BE USED AS SINGLE DRUGS FOR TREATMENT OF STAPH INFECTIONS. Performed at [facility]    Report Status 08/21/2014 FINAL  Final   Organism ID, Bacteria STAPHYLOCOCCUS AUREUS  Final      Susceptibility   Staphylococcus aureus - MIC*    CLINDAMYCIN <=0.25 SENSITIVE Sensitive     ERYTHROMYCIN <=0.25 SENSITIVE Sensitive     GENTAMICIN <=0.5 SENSITIVE Sensitive     LEVOFLOXACIN 0.25 SENSITIVE Sensitive     OXACILLIN <=0.25 SENSITIVE Sensitive     PENICILLIN 0.06 SENSITIVE Sensitive     RIFAMPIN <=0.5 SENSITIVE Sensitive     TRIMETH/SULFA <=10 SENSITIVE Sensitive     VANCOMYCIN <=0.5 SENSITIVE Sensitive     TETRACYCLINE <=1 SENSITIVE Sensitive     MOXIFLOXACIN <=0.25 SENSITIVE Sensitive     * ABUNDANT STAPHYLOCOCCUS AUREUS  Anaerobic culture     Status: None (Preliminary result)   Collection Time: 08/17/14  3:38 PM  Result Value Ref Range Status   Specimen Description ABSCESS RIGHT THIGH  Final   Special Requests Normal  Final   Gram Stain   Final    FEW WBC PRESENT, PREDOMINANTLY MONONUCLEAR NO SQUAMOUS EPITHELIAL CELLS SEEN MODERATE GRAM POSITIVE COCCI IN PAIRS IN CLUSTERS Performed at [facility]    Culture   Final    NO ANAEROBES ISOLATED; CULTURE IN PROGRESS FOR 5 DAYS Performed at [facility]    Report Status PENDING  Incomplete  Anaerobic culture     Status: None (Preliminary result)   Collection Time: 08/20/14 10:40 AM  Result Value Ref Range Status   Specimen  Description TISSUE  Final   Special Requests DEEP THIGH MUSCLE POF ANCEF CUP A  Final   Gram Stain PENDING  Incomplete   Culture   Final    NO ANAEROBES ISOLATED; CULTURE IN PROGRESS FOR 5 DAYS Performed at [facility]    Report Status PENDING  Incomplete     Studies: No results found.  Scheduled Meds: . sodium chloride   Intravenous Once  . amLODipine  10 mg Oral Daily  .  ceFAZolin (ANCEF) IV  2 g Intravenous Q24H  . darbepoetin (ARANESP) injection - NON-DIALYSIS  100 mcg Subcutaneous Q Sun-1800  . heparin  5,000 Units Subcutaneous 3 times per day  . insulin aspart  0-15 Units Subcutaneous TID WC  . insulin aspart  0-5 Units Subcutaneous QHS  . insulin aspart  2 Units Subcutaneous TID WC  . insulin glargine  15 Units Subcutaneous Daily  . levothyroxine  100 mcg Oral QAC breakfast  . metoprolol tartrate  50 mg Oral BID  . multivitamin with minerals  1 tablet Oral Daily  . senna-docusate  2 tablet Oral QHS  . sodium chloride  1,000 mL Intravenous Once  . sodium chloride  3 mL Intravenous Q12H    Continuous Infusions: . sodium chloride 10 mL/hr at 08/20/14 0850     Time spent: 25 minutes  [facility] Pager [phone]. If 7PM-7AM, please contact night-coverage at [email or web address], password [facility] 08/21/2014, 1:44 PM  LOS: 12 days

## 2014-08-22 ENCOUNTER — Encounter (HOSPITAL_COMMUNITY): Payer: Self-pay | Admitting: Orthopedic Surgery

## 2014-08-22 LAB — RENAL FUNCTION PANEL
ALBUMIN: 1.4 g/dL — AB (ref 3.5–5.0)
Anion gap: 10 (ref 5–15)
BUN: 39 mg/dL — ABNORMAL HIGH (ref 6–20)
CO2: 24 mmol/L (ref 22–32)
CREATININE: 6.04 mg/dL — AB (ref 0.44–1.00)
Calcium: 7.8 mg/dL — ABNORMAL LOW (ref 8.9–10.3)
Chloride: 101 mmol/L (ref 101–111)
GFR calc Af Amer: 10 mL/min — ABNORMAL LOW (ref >60)
GFR, EST NON AFRICAN AMERICAN: 9 mL/min — AB (ref >60)
GLUCOSE: 119 mg/dL — AB (ref 65–99)
Phosphorus: 4.2 mg/dL (ref 2.5–4.6)
Potassium: 3.6 mmol/L (ref 3.5–5.1)
Sodium: 135 mmol/L (ref 135–145)

## 2014-08-22 LAB — CBC
HCT: 21.6 % — ABNORMAL LOW (ref 36.0–46.0)
Hemoglobin: 7.2 g/dL — ABNORMAL LOW (ref 12.0–15.0)
MCH: 29 pg (ref 26.0–34.0)
MCHC: 33.3 g/dL (ref 30.0–36.0)
MCV: 87.1 fL (ref 78.0–100.0)
Platelets: 375 10*3/uL (ref 150–400)
RBC: 2.48 MIL/uL — ABNORMAL LOW (ref 3.87–5.11)
RDW: 15.7 % — ABNORMAL HIGH (ref 11.5–15.5)
WBC: 15.4 10*3/uL — AB (ref 4.0–10.5)

## 2014-08-22 LAB — GLUCOSE, CAPILLARY
GLUCOSE-CAPILLARY: 64 mg/dL — AB (ref 65–99)
Glucose-Capillary: 139 mg/dL — ABNORMAL HIGH (ref 65–99)
Glucose-Capillary: 122 mg/dL — ABNORMAL HIGH (ref 65–99)
Glucose-Capillary: 65 mg/dL (ref 65–99)
Glucose-Capillary: 88 mg/dL (ref 65–99)
Glucose-Capillary: 81 mg/dL (ref 65–99)

## 2014-08-22 LAB — CK: Total CK: 95 U/L (ref 38–234)

## 2014-08-22 MED ORDER — FUROSEMIDE 10 MG/ML IJ SOLN
40.0000 mg | Freq: Two times a day (BID) | INTRAMUSCULAR | Status: DC
  Administered 2014-08-22 – 2014-08-23 (×2): 40 mg via INTRAVENOUS
  Filled 2014-08-22 (×4): qty 4

## 2014-08-22 MED ORDER — FUROSEMIDE 10 MG/ML IJ SOLN
INTRAMUSCULAR | Status: AC
  Administered 2014-08-22: 20 mg via INTRAVENOUS
  Filled 2014-08-22: qty 4

## 2014-08-22 MED ORDER — HYDROMORPHONE HCL 2 MG PO TABS
1.0000 mg | ORAL_TABLET | ORAL | Status: DC | PRN
  Administered 2014-08-22 (×2): 1 mg via ORAL
  Filled 2014-08-22 (×3): qty 1

## 2014-08-22 MED ORDER — HYDROMORPHONE HCL 1 MG/ML IJ SOLN
0.5000 mg | INTRAMUSCULAR | Status: DC | PRN
  Administered 2014-08-22 – 2014-08-24 (×7): 0.5 mg via INTRAVENOUS
  Filled 2014-08-22 (×7): qty 1

## 2014-08-22 MED ORDER — OXYCODONE HCL ER 10 MG PO T12A
10.0000 mg | EXTENDED_RELEASE_TABLET | Freq: Two times a day (BID) | ORAL | Status: DC
Start: 2014-08-22 — End: 2014-08-23
  Administered 2014-08-22 – 2014-08-23 (×2): 10 mg via ORAL
  Filled 2014-08-22 (×2): qty 1

## 2014-08-22 NOTE — Progress Notes (Signed)
Hypoglycemic Event  CBG: 64  Treatment: 15 GM carbohydrate snack  Symptoms: None  Follow-up CBG: Time: 18:05 CBG Result: 65  Follow-up CBG: Time: 18:48 CBG Result: 88  Possible Reasons for Event: Inadequate meal intake  Comments/MD notified:    Jurnie Garritano R  Remember to initiate Hypoglycemia Order Set & complete

## 2014-08-22 NOTE — Progress Notes (Signed)
Patient ID: ZYRAH MORATH, female   DOB: 1985/11/13, 29 y.o.   MRN: [id number] S:Anxious to go home, no new complaints O:BP 122/63 mmHg  Pulse 90  Temp(Src) 99.7 F (37.6 C) (Oral)  Resp 16  Ht 5\' 3"  (1.6 m)  Wt 87.5 kg (192 lb 14.4 oz)  BMI 34.18 kg/m2  SpO2 94%  LMP 07/18/2014  Intake/Output Summary (Last 24 hours) at 08/22/14 1302 Last data filed at 08/22/14 1247  Gross per 24 hour  Intake    650 ml  Output   1600 ml  Net   -950 ml   Intake/Output: I/O last 3 completed shifts: In: [id number] [P.O.:720; I.V.:220; IV Piggyback:250] Out: 601 [Urine:601]  Intake/Output this shift:  Total I/O In: 240 [P.O.:240] Out: 1000 [Urine:1000] Weight change:  Gen:WD WN WF in NAD CVS:no rub Resp:cta [id number] Ext:+edema and tenderness   Recent Labs Lab 08/16/14 0634 08/18/14 1508 08/19/14 1436 08/20/14 0527 08/21/14 0500 08/22/14 0528  NA 132* 136 135 134* 134* 135  K 4.6 3.6 3.3* 3.5 3.9 3.6  CL 104 100* 99* 99* 100* 101  CO2 15* 22 25 25 22 24   GLUCOSE 89 253* 141* 181* 233* 119*  BUN 69* 35* 35* 35* 37* 39*  CREATININE 8.65* 5.11* 5.29* 5.71* 5.91* 6.04*  ALBUMIN  --  1.6* 1.6* 1.5* 1.4* 1.4*  CALCIUM 7.6* 8.2* 8.0* 7.9* 7.8* 7.8*  PHOS  --  3.7 3.9 4.2 4.5 4.2   Liver Function Tests:  Recent Labs Lab 08/20/14 0527 08/21/14 0500 08/22/14 0528  ALBUMIN 1.5* 1.4* 1.4*   No results for input(s): LIPASE, AMYLASE in the last 168 hours. No results for input(s): AMMONIA in the last 168 hours. CBC:  Recent Labs Lab 08/18/14 0916 08/19/14 1436 08/20/14 0527 08/21/14 0500 08/22/14 0528  WBC 21.6* 19.9* 17.3* 15.6* 15.4*  HGB 9.4* 8.9* 8.4* 7.5* 7.2*  HCT 28.2* 27.1* 25.8* 22.7* 21.6*  MCV 84.9 85.0 86.3 88.0 87.1  PLT 477* 449* 416* 389 375   Cardiac Enzymes: No results for input(s): CKTOTAL, CKMB, CKMBINDEX, TROPONINI in the last 168 hours. CBG:  Recent Labs Lab 08/21/14 1626 08/21/14 1751 08/21/14 2124 08/22/14 0756 08/22/14 1147  GLUCAP 44* 70  91 139* 122*    Iron Studies: No results for input(s): IRON, TIBC, TRANSFERRIN, FERRITIN in the last 72 hours. Studies/Results: Dg Chest 2 View  08/21/2014   CLINICAL DATA:  Cough and hypoxia for 3 days. Acute renal failure superimposed on chronic kidney disease.  EXAM: CHEST  2 VIEW  COMPARISON:  01/21/2014  FINDINGS: Moderate bilateral pleural effusions and bilateral lower lobe atelectasis or infiltrates are new since previous study. Heart size is normal.  New right jugular central venous catheter is seen with tip overlying the mid right atrium. No evidence of pneumothorax.  IMPRESSION: New moderate bilateral pleural effusions and bilateral lower lobe atelectasis versus infiltrates.  New right jugular central venous catheter tip overlies the right atrium. No pneumothorax visualized.   Electronically Signed   By: Earle Gell M.D.   On: 08/21/2014 14:58   . amLODipine  10 mg Oral Daily  .  ceFAZolin (ANCEF) IV  2 g Intravenous Q24H  . darbepoetin (ARANESP) injection - NON-DIALYSIS  100 mcg Subcutaneous Q Sun-1800  . furosemide  40 mg Intravenous Q12H  . heparin  5,000 Units Subcutaneous 3 times per day  . insulin aspart  0-15 Units Subcutaneous TID WC  . insulin aspart  0-5 Units Subcutaneous QHS  . insulin aspart  2 Units  Subcutaneous TID WC  . insulin glargine  15 Units Subcutaneous Daily  . levothyroxine  100 mcg Oral QAC breakfast  . metoprolol tartrate  50 mg Oral BID  . multivitamin with minerals  1 tablet Oral Daily  . senna-docusate  2 tablet Oral QHS  . sodium chloride  1,000 mL Intravenous Once  . sodium chloride  3 mL Intravenous Q12H    BMET    Component Value Date/Time   NA 135 08/22/2014 0528   NA 133* 01/18/2013 1106   K 3.6 08/22/2014 0528   CL 101 08/22/2014 0528   CO2 24 08/22/2014 0528   GLUCOSE 119* 08/22/2014 0528   GLUCOSE 929* 01/18/2013 1106   BUN 39* 08/22/2014 0528   BUN 37* 01/18/2013 1106   CREATININE 6.04* 08/22/2014 0528   CALCIUM 7.8* 08/22/2014  0528   GFRNONAA 9* 08/22/2014 0528   GFRAA 10* 08/22/2014 0528   CBC    Component Value Date/Time   WBC 15.4* 08/22/2014 0528   WBC 20.0* 01/18/2013 1106   RBC 2.48* 08/22/2014 0528   RBC 2.92* 08/10/2014 1220   RBC 3.70* 01/18/2013 1106   HGB 7.2* 08/22/2014 0528   HCT 21.6* 08/22/2014 0528   PLT 375 08/22/2014 0528   MCV 87.1 08/22/2014 0528   MCH 29.0 08/22/2014 0528   MCH 29.5 01/18/2013 1106   MCHC 33.3 08/22/2014 0528   MCHC 31.9 01/18/2013 1106   RDW 15.7* 08/22/2014 0528   RDW 12.7 01/18/2013 1106   LYMPHSABS 0.9 08/13/2014 0540   LYMPHSABS 1.4 01/18/2013 1106   MONOABS 1.4* 08/13/2014 0540   EOSABS 0.1 08/13/2014 0540   EOSABS 0.0 01/18/2013 1106   BASOSABS 0.1 08/13/2014 0540   BASOSABS 0.0 01/18/2013 1106   Assessment/Plan:  1. AKI/CKD- has advanced CKD at baseline due to poorly controlled DM and HTN further complicated by NSAIDs as well as likely ischemic ATN.  Increased UOP.  Underwent HD on 7/5 and 08/17/14 and since her last treatment her scr continues to slowly rise.   1. Hold off on HD for now and continue to follow closely 2. Will recheck CPK to r/o rhabdo as additional etiology of ongoing injury 3. No indication for dialysis at this time, however she is not stable for discharge due to possible need in the near future 4. Discussed treatment options including dialysis as well as kidney pancrease transplant (was under our care until January 2015 when she lost her insurance/job and has not come back since.  Has been referred to [facility], however if she requires longterm RRT, she will need to re-establish with our group after discharge). 2. Pyomyositis- s/p I&D of right arm as well as right thigh/leg with indwelling drains.  On Ancef for MSSA 3. Hyponatremia- due to #1, improved with HD 4. DM- poorly controlled, management per primary svc 5. Anemia- likely due to CKD s/p Aranesp 08/21/14.   6. Vascular access- will likely need tunneled HD cath if she does not have  return of renal function.  Will need to hold off on AVF until pyomyositis has resolved.    [place] A

## 2014-08-22 NOTE — Progress Notes (Signed)
PROGRESS NOTE  Tracy [name] [id number] DOB: 1985/04/11 DOA: 08/09/2014 PCP: [name]  HPI/Recap of past 24 hours: This is a 29 year old Caucasian female with history of diabetes mellitus on insulin pump but poor control, hypothyroidism poor compliance she stopped taking her Synthroid 2 months ago for no known reason, chronic kidney disease stage IV baseline creatinine close to 3, who was admitted to the hospital 11 days ago for 2 week history of right arm and right leg pain, upon further workup with MRI was found that she had extensive myositis in both right-sided extremities. She was initially seen at [facility] in [place] and then transferred to [facility].  She was seen here by hand surgery, general surgery, orthopedic surgery, renal, infectious disease, phone consultation was made by me with rheumatologist Dr. Amil Amen. Initially no clear reason of myositis could be found, however since her clinical course did not show any improvement she was taken to the OR first by Dr. Amedeo Plenty about 5 days ago and right arm fluid was drained which was suggestive of infectious myositis, she has been on IV antibiotics throughout her hospital stay. On 08/20/2014 she returned to the OR again by orthopedic surgery for drainage of right leg fluid collection. Her workup certainly suggests infectious myositis however reason for her bacteremia is not clear at all. ID is on board as well.  Note due to her illness patient's has progressed from chronic kidney disease stage IV to close to ESRD, currently under dialysis for the last 7 days. Renal to decide whether this is ESRD or not.  In addition, nursing noted on 7/10 morning that patient is hypoxic off of oxygen and currently requiring 3 L to say above 90% patient has a mild nonproductive cough and some low-grade temperatures. Chest x-ray done only noted mild pleural effusions but no infiltrate.  Patient today complains of some  soreness.   Assessment/Plan: Principal Problem:   Myositis: Possibly from post physical string 2 weeks prior from throwing horse feed along with muscle injury with secondary infection. Status post right arm muscle biopsy along with fluid drainage and JP drain as well as right leg ET guided drainage. Source of bacteremia unclear. Echocardiogram normal.  Active Problems:   Proliferative diabetic retinopathy   Hypothyroidism: We started on Synthroid after noncompliance.    Normocytic anemia: Iron deficiency and also in part from renal failure and chronic disease. Status post 2 units packed red blood cells transfusion on 7/6    Acute renal failure superimposed on stage 4 chronic kidney disease: Previous creatinine baseline at 3. Possibly from hypovolemia from nausea and vomiting plus NSAID use prior to admission causing ATN. Voiding effort toxins. Hydrating and renal ultrasound noting cyst but no obstruction. Urine output stable. Followed by nephrology. Interventional radiology placed ounces catheter on 7/4 and patient underwent dialysis on 7/5. Currently monitoring, but no plans for further dialysis unit    DM I (diabetes mellitus, type I), uncontrolled with renal disease: Poor outpatient control. A1c of 8.9. Currently on Lantus plus sliding scale.  CBGs better controlled today    Essential hypertension   Acute respiratory failure with hypoxia: Unclear etiology. Continue oxygen. Suspect volume overload. Patient is +16 L of fluid. Started Lasix. Have encouraged inspirometer use, currently she is only at 500 mL.    Code Status: Full code  Family Communication: grandmother at the bedside  Disposition Plan: Continue inpatient until infection felt to be treated, decision made about further dialysis and hypoxia resolved/diuresed fully  Consultants:  Case discussed with rheumatology by phone.  Interventional radiology.  Infectious disease.  Nephrology  General surgery  Hand  surgery  Orthopedic surgery  Procedures:  Right arm muscle biopsy plus fluid drainage and JP drain placement of right arm done 7/3  Right IJ dialysis catheter by interventional radiology 7/4  Dialysis session starting 7/5  Right leg ultrasound guided aspiration by interventional radiology 7/6  Leg expiration with drainage of fluid done 7/9  Transfusion 2 units packed red blood cells then 7/6  Antibiotics:  IV vancomycin 6/28-7/5  IV Azactam 6/28-7/4  IV clindamycin 7/4-7/5  IV Ancef 7/6-7/9   Objective: BP 122/63 mmHg  Pulse 90  Temp(Src) 99.7 F (37.6 C) (Oral)  Resp 16  Ht 5\' 3"  (1.6 m)  Wt 87.5 kg (192 lb 14.4 oz)  BMI 34.18 kg/m2  SpO2 94%  LMP 07/18/2014  Intake/Output Summary (Last 24 hours) at 08/22/14 1207 Last data filed at 08/22/14 1042  Gross per 24 hour  Intake    650 ml  Output   1300 ml  Net   -650 ml   Filed Weights   08/18/14 0535 08/20/14 0513 08/21/14 0920  Weight: 87.3 kg (192 lb 7.4 oz) 87.5 kg (192 lb 14.4 oz) 87.5 kg (192 lb 14.4 oz)    Exam:   General:  Alert and oriented 3, no acute distress  Cardiovascular: Regular rate and rhythm, S1-S2  Respiratory: decreased breath sounds bibasilar, poor inspiratory effort  Abdomen: Soft, nontender, nondistended, positive bowel sounds  Musculoskeletal: No clubbing or cyanosis, trace edema. Left lower extremity wrapped   Data Reviewed: Basic Metabolic Panel:  Recent Labs Lab 08/18/14 1508 08/19/14 1436 08/20/14 0527 08/21/14 0500 08/22/14 0528  NA 136 135 134* 134* 135  K 3.6 3.3* 3.5 3.9 3.6  CL 100* 99* 99* 100* 101  CO2 22 25 25 22 24   GLUCOSE 253* 141* 181* 233* 119*  BUN 35* 35* 35* 37* 39*  CREATININE 5.11* 5.29* 5.71* 5.91* 6.04*  CALCIUM 8.2* 8.0* 7.9* 7.8* 7.8*  PHOS 3.7 3.9 4.2 4.5 4.2   Liver Function Tests:  Recent Labs Lab 08/18/14 1508 08/19/14 1436 08/20/14 0527 08/21/14 0500 08/22/14 0528  ALBUMIN 1.6* 1.6* 1.5* 1.4* 1.4*   No results for  input(s): LIPASE, AMYLASE in the last 168 hours. No results for input(s): AMMONIA in the last 168 hours. CBC:  Recent Labs Lab 08/18/14 0916 08/19/14 1436 08/20/14 0527 08/21/14 0500 08/22/14 0528  WBC 21.6* 19.9* 17.3* 15.6* 15.4*  HGB 9.4* 8.9* 8.4* 7.5* 7.2*  HCT 28.2* 27.1* 25.8* 22.7* 21.6*  MCV 84.9 85.0 86.3 88.0 87.1  PLT 477* 449* 416* 389 375   Cardiac Enzymes:   No results for input(s): CKTOTAL, CKMB, CKMBINDEX, TROPONINI in the last 168 hours. BNP (last 3 results) No results for input(s): BNP in the last 8760 hours.  ProBNP (last 3 results) No results for input(s): PROBNP in the last 8760 hours.  CBG:  Recent Labs Lab 08/21/14 1626 08/21/14 1751 08/21/14 2124 08/22/14 0756 08/22/14 1147  GLUCAP 44* 70 91 139* 122*    Recent Results (from the past 240 hour(s))  Anaerobic culture     Status: None   Collection Time: 08/14/14  2:32 PM  Result Value Ref Range Status   Specimen Description FLUID  Final   Special Requests RIGHT ARM FLUID A  Final   Gram Stain   Final    MODERATE WBC PRESENT,BOTH PMN AND MONONUCLEAR NO SQUAMOUS EPITHELIAL CELLS SEEN RARE GRAM POSITIVE COCCI IN  PAIRS IN CLUSTERS Performed at [facility]    Culture   Final    NO ANAEROBES ISOLATED Performed at [facility]    Report Status 08/19/2014 FINAL  Final  Culture, routine-abscess     Status: None   Collection Time: 08/14/14  2:32 PM  Result Value Ref Range Status   Specimen Description ARM RIGHT  Final   Special Requests NONE  Final   Gram Stain   Final    FEW WBC PRESENT, PREDOMINANTLY PMN NO SQUAMOUS EPITHELIAL CELLS SEEN RARE GRAM POSITIVE COCCI IN CLUSTERS Performed at [facility]    Culture   Final    FEW STAPHYLOCOCCUS AUREUS Note: RIFAMPIN AND GENTAMICIN SHOULD NOT BE USED AS SINGLE DRUGS FOR TREATMENT OF STAPH INFECTIONS. Performed at [facility]    Report Status 08/17/2014 FINAL  Final   Organism ID, Bacteria STAPHYLOCOCCUS  AUREUS  Final      Susceptibility   Staphylococcus aureus - MIC*    CLINDAMYCIN <=0.25 SENSITIVE Sensitive     ERYTHROMYCIN <=0.25 SENSITIVE Sensitive     GENTAMICIN <=0.5 SENSITIVE Sensitive     LEVOFLOXACIN 0.25 SENSITIVE Sensitive     OXACILLIN <=0.25 SENSITIVE Sensitive     PENICILLIN 0.06 SENSITIVE Sensitive     RIFAMPIN <=0.5 SENSITIVE Sensitive     TRIMETH/SULFA <=10 SENSITIVE Sensitive     VANCOMYCIN 1 SENSITIVE Sensitive     TETRACYCLINE <=1 SENSITIVE Sensitive     MOXIFLOXACIN <=0.25 SENSITIVE Sensitive     * FEW STAPHYLOCOCCUS AUREUS  Anaerobic culture     Status: None   Collection Time: 08/14/14  2:36 PM  Result Value Ref Range Status   Specimen Description TISSUE  Final   Special Requests RIGHT ARM B  Final   Gram Stain   Final    NO WBC SEEN NO SQUAMOUS EPITHELIAL CELLS SEEN RARE GRAM POSITIVE COCCI IN PAIRS IN CLUSTERS Performed at [facility]    Culture   Final    NO ANAEROBES ISOLATED Performed at [facility]    Report Status 08/19/2014 FINAL  Final  Tissue culture     Status: None   Collection Time: 08/14/14  2:36 PM  Result Value Ref Range Status   Specimen Description TISSUE  Final   Special Requests RIGHT ARM B  Final   Gram Stain   Final    RARE WBC PRESENT, PREDOMINANTLY PMN NO SQUAMOUS EPITHELIAL CELLS SEEN RARE GRAM POSITIVE COCCI IN PAIRS Performed at [facility]    Culture   Final    MODERATE STAPHYLOCOCCUS AUREUS Note: RIFAMPIN AND GENTAMICIN SHOULD NOT BE USED AS SINGLE DRUGS FOR TREATMENT OF STAPH INFECTIONS. Performed at [facility]    Report Status 08/17/2014 FINAL  Final   Organism ID, Bacteria STAPHYLOCOCCUS AUREUS  Final      Susceptibility   Staphylococcus aureus - MIC*    CLINDAMYCIN <=0.25 SENSITIVE Sensitive     ERYTHROMYCIN <=0.25 SENSITIVE Sensitive     GENTAMICIN <=0.5 SENSITIVE Sensitive     LEVOFLOXACIN 0.25 SENSITIVE Sensitive     OXACILLIN <=0.25 SENSITIVE Sensitive      PENICILLIN 0.06 SENSITIVE Sensitive     RIFAMPIN <=0.5 SENSITIVE Sensitive     TRIMETH/SULFA <=10 SENSITIVE Sensitive     VANCOMYCIN 1 SENSITIVE Sensitive     TETRACYCLINE <=1 SENSITIVE Sensitive     MOXIFLOXACIN <=0.25 SENSITIVE Sensitive     * MODERATE STAPHYLOCOCCUS AUREUS  Culture, routine-abscess     Status: None  Collection Time: 08/17/14  3:38 PM  Result Value Ref Range Status   Specimen Description ABSCESS RIGHT THIGH  Final   Special Requests Normal  Final   Gram Stain   Final    FEW WBC PRESENT, PREDOMINANTLY MONONUCLEAR NO SQUAMOUS EPITHELIAL CELLS SEEN MODERATE GRAM POSITIVE COCCI IN PAIRS IN CLUSTERS Performed at [facility]    Culture   Final    ABUNDANT STAPHYLOCOCCUS AUREUS Note: RIFAMPIN AND GENTAMICIN SHOULD NOT BE USED AS SINGLE DRUGS FOR TREATMENT OF STAPH INFECTIONS. Performed at [facility]    Report Status 08/21/2014 FINAL  Final   Organism ID, Bacteria STAPHYLOCOCCUS AUREUS  Final      Susceptibility   Staphylococcus aureus - MIC*    CLINDAMYCIN <=0.25 SENSITIVE Sensitive     ERYTHROMYCIN <=0.25 SENSITIVE Sensitive     GENTAMICIN <=0.5 SENSITIVE Sensitive     LEVOFLOXACIN 0.25 SENSITIVE Sensitive     OXACILLIN <=0.25 SENSITIVE Sensitive     PENICILLIN 0.06 SENSITIVE Sensitive     RIFAMPIN <=0.5 SENSITIVE Sensitive     TRIMETH/SULFA <=10 SENSITIVE Sensitive     VANCOMYCIN <=0.5 SENSITIVE Sensitive     TETRACYCLINE <=1 SENSITIVE Sensitive     MOXIFLOXACIN <=0.25 SENSITIVE Sensitive     * ABUNDANT STAPHYLOCOCCUS AUREUS  Anaerobic culture     Status: None (Preliminary result)   Collection Time: 08/17/14  3:38 PM  Result Value Ref Range Status   Specimen Description ABSCESS RIGHT THIGH  Final   Special Requests Normal  Final   Gram Stain   Final    FEW WBC PRESENT, PREDOMINANTLY MONONUCLEAR NO SQUAMOUS EPITHELIAL CELLS SEEN MODERATE GRAM POSITIVE COCCI IN PAIRS IN CLUSTERS Performed at [facility]    Culture   Final     NO ANAEROBES ISOLATED; CULTURE IN PROGRESS FOR 5 DAYS Performed at [facility]    Report Status PENDING  Incomplete  Anaerobic culture     Status: None (Preliminary result)   Collection Time: 08/20/14 10:40 AM  Result Value Ref Range Status   Specimen Description TISSUE  Final   Special Requests DEEP THIGH MUSCLE POF ANCEF CUP A  Final   Gram Stain   Final    ABUNDANT WBC PRESENT,BOTH PMN AND MONONUCLEAR NO SQUAMOUS EPITHELIAL CELLS SEEN RARE GRAM POSITIVE COCCI IN CLUSTERS Performed at [facility]    Culture   Final    NO ANAEROBES ISOLATED; CULTURE IN PROGRESS FOR 5 DAYS Performed at [facility]    Report Status PENDING  Incomplete  Tissue culture     Status: None (Preliminary result)   Collection Time: 08/20/14 10:40 AM  Result Value Ref Range Status   Specimen Description TISSUE THIGH  Final   Special Requests POF ON ANCEF  Final   Gram Stain   Final    MODERATE WBC PRESENT,BOTH PMN AND MONONUCLEAR NO SQUAMOUS EPITHELIAL CELLS SEEN FEW GRAM POSITIVE COCCI IN CLUSTERS Performed at [facility]    Culture   Final    FEW STAPHYLOCOCCUS AUREUS Note: RIFAMPIN AND GENTAMICIN SHOULD NOT BE USED AS SINGLE DRUGS FOR TREATMENT OF STAPH INFECTIONS. Performed at [facility]    Report Status PENDING  Incomplete     Studies: Dg Chest 2 View  08/21/2014   CLINICAL DATA:  Cough and hypoxia for 3 days. Acute renal failure superimposed on chronic kidney disease.  EXAM: CHEST  2 VIEW  COMPARISON:  01/21/2014  FINDINGS: Moderate bilateral pleural effusions and bilateral lower lobe  atelectasis or infiltrates are new since previous study. Heart size is normal.  New right jugular central venous catheter is seen with tip overlying the mid right atrium. No evidence of pneumothorax.  IMPRESSION: New moderate bilateral pleural effusions and bilateral lower lobe atelectasis versus infiltrates.  New right jugular central venous catheter tip overlies the  right atrium. No pneumothorax visualized.   Electronically Signed   By: Earle Gell M.D.   On: 08/21/2014 14:58    Scheduled Meds: . amLODipine  10 mg Oral Daily  .  ceFAZolin (ANCEF) IV  2 g Intravenous Q24H  . darbepoetin (ARANESP) injection - NON-DIALYSIS  100 mcg Subcutaneous Q Sun-1800  . furosemide  40 mg Intravenous Q12H  . heparin  5,000 Units Subcutaneous 3 times per day  . insulin aspart  0-15 Units Subcutaneous TID WC  . insulin aspart  0-5 Units Subcutaneous QHS  . insulin aspart  2 Units Subcutaneous TID WC  . insulin glargine  15 Units Subcutaneous Daily  . levothyroxine  100 mcg Oral QAC breakfast  . metoprolol tartrate  50 mg Oral BID  . multivitamin with minerals  1 tablet Oral Daily  . senna-docusate  2 tablet Oral QHS  . sodium chloride  1,000 mL Intravenous Once  . sodium chloride  3 mL Intravenous Q12H    Continuous Infusions: . sodium chloride       Time spent: 25 minutes  [facility] Pager [phone]. If 7PM-7AM, please contact night-coverage at [email or web address], password [facility] 08/22/2014, 12:07 PM  LOS: 13 days

## 2014-08-22 NOTE — Progress Notes (Signed)
Patient ID: Tracy [name], female   DOB: 1985-11-26, 29 y.o.   MRN: [id number] Patient seen and examined at bedside.  Right arm looks very good.  Patient can fire her triceps actively. She has full pronation supination flexion and extension about the wrist. Her flexion and extension of the elbow is 20 extension and 100 flexion actively  No signs of compartment syndrome or, Kadian feature. Her wound is clean dry and intact. Swelling is minimal.  Continue close observation.  Events noted. We will plan for suture removal at the end of the week or weekend - regarding the right upper arm  Youa Deloney M.D.

## 2014-08-22 NOTE — Progress Notes (Signed)
Patient ID: Tracy [name], female   DOB: [birth year], 29 y.o.   MRN: [id number]         [facility]    Date of Admission:  08/09/2014   Total days of antibiotics 14 Day 6 of cefazolin  Principal Problem:   Myositis Active Problems:   Proliferative diabetic retinopathy   Hypothyroidism   Normocytic anemia   Acute renal failure superimposed on stage 4 chronic kidney disease   Seizure   Sepsis   DM I (diabetes mellitus, type I), uncontrolled   Essential hypertension   Acute respiratory failure with hypoxia   . amLODipine  10 mg Oral Daily  .  ceFAZolin (ANCEF) IV  2 g Intravenous Q24H  . darbepoetin (ARANESP) injection - NON-DIALYSIS  100 mcg Subcutaneous Q Sun-1800  . furosemide  40 mg Intravenous Q12H  . heparin  5,000 Units Subcutaneous 3 times per day  . insulin aspart  0-15 Units Subcutaneous TID WC  . insulin aspart  0-5 Units Subcutaneous QHS  . insulin aspart  2 Units Subcutaneous TID WC  . insulin glargine  15 Units Subcutaneous Daily  . levothyroxine  100 mcg Oral QAC breakfast  . metoprolol tartrate  50 mg Oral BID  . multivitamin with minerals  1 tablet Oral Daily  . senna-docusate  2 tablet Oral QHS  . sodium chloride  1,000 mL Intravenous Once  . sodium chloride  3 mL Intravenous Q12H    Subjective: Tracy [name] states she is doing well today. She only complains of a mild cough which began over the weekend. Her right medial thigh pyomyositis was surgically drained by Orthopedics on 08/20/2014; she states her pain is well-controlled. She feels that the pain and swelling in her arm and leg are improving.  Review of Systems: Pertinent items are noted in HPI.  Past Medical History  Diagnosis Date  . Thyroid enlargement     "not on medication at this time"  . Urinary tract infection     hx of  . Anemia     presently on iron supplement  .  Diabetes mellitus     insulin pump   . Hypothyroidism   . Anxiety   . HSV-2 (herpes simplex virus 2) infection   . HSV-1 (herpes simplex virus 1) infection   . Detached retina   . Yeast infection     took diflucan saturday   . Hypertension   . Irregular periods 07/05/2014  . Vaginal discharge 07/05/2014    History  Substance Use Topics  . Smoking status: Never Smoker   . Smokeless tobacco: Never Used  . Alcohol Use: No    Family History  Problem Relation [name] of Onset  . Cancer Paternal Grandfather     prostate  . Hyperlipidemia Paternal Grandfather   . Stroke Paternal Grandfather    Allergies  Allergen Reactions  . Penicillins Hives and Swelling  . Sulfa Antibiotics Hives    OBJECTIVE: Blood pressure 122/63, pulse 90, temperature 99.7 F (37.6 C), temperature source Oral, resp. rate 16, height 5\' 3"  (1.6 m), weight 87.5 kg (192 lb 14.4 oz), last menstrual period 07/18/2014, SpO2 94 %. General: Lying in bed comfortably Skin: No erythema, fluctuance, or tenderness over right medial upper arm. Wound vac over right medial thigh. Lungs: Bibasilar crackles present over posterior auscultation. No focal bronchial breath sounds. POX 94% on 4.5L O2.   Lab Results Lab Results  Component Value Date   WBC 15.4* 08/22/2014   HGB  7.2* 08/22/2014   HCT 21.6* 08/22/2014   MCV 87.1 08/22/2014   PLT 375 08/22/2014    Lab Results  Component Value Date   CREATININE 6.04* 08/22/2014   BUN 39* 08/22/2014   NA 135 08/22/2014   K 3.6 08/22/2014   CL 101 08/22/2014   CO2 24 08/22/2014    Lab Results  Component Value Date   ALT 18 08/10/2014   AST 38 08/10/2014   ALKPHOS 166* 08/10/2014   BILITOT 0.6 08/10/2014     Microbiology: Recent Results (from the past 240 hour(s))  Anaerobic culture     Status: None   Collection Time: 08/14/14  2:32 PM  Result Value Ref Range Status   Specimen Description FLUID  Final   Special Requests RIGHT ARM FLUID A  Final   Gram Stain    Final    MODERATE WBC PRESENT,BOTH PMN AND MONONUCLEAR NO SQUAMOUS EPITHELIAL CELLS SEEN RARE GRAM POSITIVE COCCI IN PAIRS IN CLUSTERS Performed at [facility]    Culture   Final    NO ANAEROBES ISOLATED Performed at [facility]    Report Status 08/19/2014 FINAL  Final  Culture, routine-abscess     Status: None   Collection Time: 08/14/14  2:32 PM  Result Value Ref Range Status   Specimen Description ARM RIGHT  Final   Special Requests NONE  Final   Gram Stain   Final    FEW WBC PRESENT, PREDOMINANTLY PMN NO SQUAMOUS EPITHELIAL CELLS SEEN RARE GRAM POSITIVE COCCI IN CLUSTERS Performed at [facility]    Culture   Final    FEW STAPHYLOCOCCUS AUREUS Note: RIFAMPIN AND GENTAMICIN SHOULD NOT BE USED AS SINGLE DRUGS FOR TREATMENT OF STAPH INFECTIONS. Performed at [facility]    Report Status 08/17/2014 FINAL  Final   Organism ID, Bacteria STAPHYLOCOCCUS AUREUS  Final      Susceptibility   Staphylococcus aureus - MIC*    CLINDAMYCIN <=0.25 SENSITIVE Sensitive     ERYTHROMYCIN <=0.25 SENSITIVE Sensitive     GENTAMICIN <=0.5 SENSITIVE Sensitive     LEVOFLOXACIN 0.25 SENSITIVE Sensitive     OXACILLIN <=0.25 SENSITIVE Sensitive     PENICILLIN 0.06 SENSITIVE Sensitive     RIFAMPIN <=0.5 SENSITIVE Sensitive     TRIMETH/SULFA <=10 SENSITIVE Sensitive     VANCOMYCIN 1 SENSITIVE Sensitive     TETRACYCLINE <=1 SENSITIVE Sensitive     MOXIFLOXACIN <=0.25 SENSITIVE Sensitive     * FEW STAPHYLOCOCCUS AUREUS  Anaerobic culture     Status: None   Collection Time: 08/14/14  2:36 PM  Result Value Ref Range Status   Specimen Description TISSUE  Final   Special Requests RIGHT ARM B  Final   Gram Stain   Final    NO WBC SEEN NO SQUAMOUS EPITHELIAL CELLS SEEN RARE GRAM POSITIVE COCCI IN PAIRS IN CLUSTERS Performed at [facility]    Culture   Final    NO ANAEROBES ISOLATED Performed at [facility]    Report Status 08/19/2014 FINAL   Final  Tissue culture     Status: None   Collection Time: 08/14/14  2:36 PM  Result Value Ref Range Status   Specimen Description TISSUE  Final   Special Requests RIGHT ARM B  Final   Gram Stain   Final    RARE WBC PRESENT, PREDOMINANTLY PMN NO SQUAMOUS EPITHELIAL CELLS SEEN RARE GRAM POSITIVE COCCI IN PAIRS Performed at [facility]  Final    MODERATE STAPHYLOCOCCUS AUREUS Note: RIFAMPIN AND GENTAMICIN SHOULD NOT BE USED AS SINGLE DRUGS FOR TREATMENT OF STAPH INFECTIONS. Performed at [facility]    Report Status 08/17/2014 FINAL  Final   Organism ID, Bacteria STAPHYLOCOCCUS AUREUS  Final      Susceptibility   Staphylococcus aureus - MIC*    CLINDAMYCIN <=0.25 SENSITIVE Sensitive     ERYTHROMYCIN <=0.25 SENSITIVE Sensitive     GENTAMICIN <=0.5 SENSITIVE Sensitive     LEVOFLOXACIN 0.25 SENSITIVE Sensitive     OXACILLIN <=0.25 SENSITIVE Sensitive     PENICILLIN 0.06 SENSITIVE Sensitive     RIFAMPIN <=0.5 SENSITIVE Sensitive     TRIMETH/SULFA <=10 SENSITIVE Sensitive     VANCOMYCIN 1 SENSITIVE Sensitive     TETRACYCLINE <=1 SENSITIVE Sensitive     MOXIFLOXACIN <=0.25 SENSITIVE Sensitive     * MODERATE STAPHYLOCOCCUS AUREUS  Culture, routine-abscess     Status: None   Collection Time: 08/17/14  3:38 PM  Result Value Ref Range Status   Specimen Description ABSCESS RIGHT THIGH  Final   Special Requests Normal  Final   Gram Stain   Final    FEW WBC PRESENT, PREDOMINANTLY MONONUCLEAR NO SQUAMOUS EPITHELIAL CELLS SEEN MODERATE GRAM POSITIVE COCCI IN PAIRS IN CLUSTERS Performed at [facility]    Culture   Final    ABUNDANT STAPHYLOCOCCUS AUREUS Note: RIFAMPIN AND GENTAMICIN SHOULD NOT BE USED AS SINGLE DRUGS FOR TREATMENT OF STAPH INFECTIONS. Performed at [facility]    Report Status 08/21/2014 FINAL  Final   Organism ID, Bacteria STAPHYLOCOCCUS AUREUS  Final      Susceptibility   Staphylococcus aureus - MIC*    CLINDAMYCIN  <=0.25 SENSITIVE Sensitive     ERYTHROMYCIN <=0.25 SENSITIVE Sensitive     GENTAMICIN <=0.5 SENSITIVE Sensitive     LEVOFLOXACIN 0.25 SENSITIVE Sensitive     OXACILLIN <=0.25 SENSITIVE Sensitive     PENICILLIN 0.06 SENSITIVE Sensitive     RIFAMPIN <=0.5 SENSITIVE Sensitive     TRIMETH/SULFA <=10 SENSITIVE Sensitive     VANCOMYCIN <=0.5 SENSITIVE Sensitive     TETRACYCLINE <=1 SENSITIVE Sensitive     MOXIFLOXACIN <=0.25 SENSITIVE Sensitive     * ABUNDANT STAPHYLOCOCCUS AUREUS  Anaerobic culture     Status: None (Preliminary result)   Collection Time: 08/17/14  3:38 PM  Result Value Ref Range Status   Specimen Description ABSCESS RIGHT THIGH  Final   Special Requests Normal  Final   Gram Stain   Final    FEW WBC PRESENT, PREDOMINANTLY MONONUCLEAR NO SQUAMOUS EPITHELIAL CELLS SEEN MODERATE GRAM POSITIVE COCCI IN PAIRS IN CLUSTERS Performed at [facility]    Culture   Final    NO ANAEROBES ISOLATED; CULTURE IN PROGRESS FOR 5 DAYS Performed at [facility]    Report Status PENDING  Incomplete  Anaerobic culture     Status: None (Preliminary result)   Collection Time: 08/20/14 10:40 AM  Result Value Ref Range Status   Specimen Description TISSUE  Final   Special Requests DEEP THIGH MUSCLE POF ANCEF CUP A  Final   Gram Stain   Final    ABUNDANT WBC PRESENT,BOTH PMN AND MONONUCLEAR NO SQUAMOUS EPITHELIAL CELLS SEEN RARE GRAM POSITIVE COCCI IN CLUSTERS Performed at [facility]    Culture   Final    NO ANAEROBES ISOLATED; CULTURE IN PROGRESS FOR 5 DAYS Performed at [facility]    Report Status PENDING  Incomplete  Tissue culture     Status: None (Preliminary result)   Collection Time: 08/20/14 10:40 AM  Result Value Ref Range Status   Specimen Description TISSUE THIGH  Final   Special Requests POF ON ANCEF  Final   Gram Stain   Final    MODERATE WBC PRESENT,BOTH PMN AND MONONUCLEAR NO SQUAMOUS EPITHELIAL CELLS SEEN FEW GRAM POSITIVE  COCCI IN CLUSTERS Performed at [facility]    Culture   Final    FEW STAPHYLOCOCCUS AUREUS Note: RIFAMPIN AND GENTAMICIN SHOULD NOT BE USED AS SINGLE DRUGS FOR TREATMENT OF STAPH INFECTIONS. Performed at [facility]    Report Status PENDING  Incomplete    Assessment: The cultures from Tracy [name] right medial thigh from 7/6 cultured MSSA despite 9 days of cefazolin therapy. We suspect this is because the myositis was walled-off and local antibiotic concentrations were inadequate to eradicate the bacteria. Surgical drainage performed over the weekend (7/9) should help. We will follow cultures from her surgery. We recommend continuing cefazolin for now and we will be following nephrology's notes on recommendation for future hemodialysis which will allow [place] to plan for PICC line for antibiotics.  Plan: 1. Continue cefazolin  Loleta Chance, MD [facility] 08/22/2014, 1:35 PM   Addendum: She is comfortable sitting up in a chair. She feels that the pain and swelling in her right arm and right thigh are improving. Despite antibiotics operative cultures from recent surgeries remain positive for MSSA. She should start to respond even better now that right thigh abscesses have been drained. I agree with continuing cefazolin. She will need several more weeks of therapy.  Michel Bickers, MD [facility] [place] [facility] [phone] pager   [phone] cell 08/22/2014, 2:01 PM

## 2014-08-22 NOTE — Progress Notes (Signed)
     Subjective:  POD#2 Irrigation and debridement of R thigh. Patient reports pain as mild to moderate.  Up to a chair today.   Able to tolerate ambulation in the halls with the assistance of a walker. Drain pulled today. Cultures grew MSSA.   Objective:   VITALS:   Filed Vitals:   08/21/14 1700 08/21/14 2012 08/22/14 0616 08/22/14 0954  BP: 118/61 128/60 164/66 122/63  Pulse: 84 86 95 90  Temp: 97.9 F (36.6 C) 99.3 F (37.4 C) 99.9 F (37.7 C) 99.7 F (37.6 C)  TempSrc: Oral Oral Oral Oral  Resp: 18 16 20 16   Height:      Weight:      SpO2: 100% 93% 90% 94%    Neurologically intact ABD soft Neurovascular intact Sensation intact distally Intact pulses distally Dorsiflexion/Plantar flexion intact Incision: dressing C/D/I Wound vac to the right thigh incision.   Lab Results  Component Value Date   WBC 15.4* 08/22/2014   HGB 7.2* 08/22/2014   HCT 21.6* 08/22/2014   MCV 87.1 08/22/2014   PLT 375 08/22/2014   BMET    Component Value Date/Time   NA 135 08/22/2014 0528   NA 133* 01/18/2013 1106   K 3.6 08/22/2014 0528   CL 101 08/22/2014 0528   CO2 24 08/22/2014 0528   GLUCOSE 119* 08/22/2014 0528   GLUCOSE 929* 01/18/2013 1106   BUN 39* 08/22/2014 0528   BUN 37* 01/18/2013 1106   CREATININE 6.04* 08/22/2014 0528   CALCIUM 7.8* 08/22/2014 0528   GFRNONAA 9* 08/22/2014 0528   GFRAA 10* 08/22/2014 0528     Assessment/Plan: 2 Days Post-Op   Principal Problem:   Myositis Active Problems:   Proliferative diabetic retinopathy   Hypothyroidism   Normocytic anemia   Acute renal failure superimposed on stage 4 chronic kidney disease   Seizure   Sepsis   DM I (diabetes mellitus, type I), uncontrolled   Essential hypertension   Acute respiratory failure with hypoxia   Up with therapy WBAT in the RLE Continue abx per ID team We pulled the drain today.  Will likely remove the wound vac tomorrow and change to a dry dressing. She report the thigh remain  sore but is improving from yesterday.    Tracy [name] 08/22/2014, 4:19 PM Cell [phone]

## 2014-08-22 NOTE — Progress Notes (Signed)
PT Cancellation Note  Patient Details Name: Tracy [name] MRN: [id number] DOB: Jun 18, 1985   Cancelled Treatment:    Reason Eval/Treat Not Completed: Other (comment) (Pt is ambulating with RN staff).  Pt reports compliance with HEP and is ambulating with RN staff and RW.  She reports compliance with LE exercises and I encouraged her to complete these to her pain tolerance level.  I also discussed that she did not have any weight bearing restrictions for her legs (this was also up to her pain tolerance).  She is agreeable for PT to check back later this week to see if she is ready to transition to a cane, but is currently preforming her own ambulation with RN staff supervision.  PT will check back hopefully Wednesday (08/24/14) as time allows. Thanks,    Barbarann Ehlers. Velinda Wrobel, PT, DPT [phone]   08/22/2014, 1:50 PM

## 2014-08-23 LAB — RENAL FUNCTION PANEL
ALBUMIN: 1.3 g/dL — AB (ref 3.5–5.0)
Anion gap: 11 (ref 5–15)
BUN: 40 mg/dL — ABNORMAL HIGH (ref 6–20)
CO2: 23 mmol/L (ref 22–32)
Calcium: 8 mg/dL — ABNORMAL LOW (ref 8.9–10.3)
Chloride: 100 mmol/L — ABNORMAL LOW (ref 101–111)
Creatinine, Ser: 6.17 mg/dL — ABNORMAL HIGH (ref 0.44–1.00)
GFR, EST AFRICAN AMERICAN: 10 mL/min — AB (ref >60)
GFR, EST NON AFRICAN AMERICAN: 8 mL/min — AB (ref >60)
GLUCOSE: 84 mg/dL (ref 65–99)
PHOSPHORUS: 4.3 mg/dL (ref 2.5–4.6)
POTASSIUM: 3.6 mmol/L (ref 3.5–5.1)
SODIUM: 134 mmol/L — AB (ref 135–145)

## 2014-08-23 LAB — TISSUE CULTURE

## 2014-08-23 LAB — CBC
HCT: 20.8 % — ABNORMAL LOW (ref 36.0–46.0)
Hemoglobin: 6.8 g/dL — CL (ref 12.0–15.0)
MCH: 28.5 pg (ref 26.0–34.0)
MCHC: 32.7 g/dL (ref 30.0–36.0)
MCV: 87 fL (ref 78.0–100.0)
Platelets: 401 10*3/uL — ABNORMAL HIGH (ref 150–400)
RBC: 2.39 MIL/uL — ABNORMAL LOW (ref 3.87–5.11)
RDW: 15.9 % — ABNORMAL HIGH (ref 11.5–15.5)
WBC: 14.1 10*3/uL — ABNORMAL HIGH (ref 4.0–10.5)

## 2014-08-23 LAB — GLUCOSE, CAPILLARY
GLUCOSE-CAPILLARY: 72 mg/dL (ref 65–99)
GLUCOSE-CAPILLARY: 66 mg/dL (ref 65–99)
GLUCOSE-CAPILLARY: 78 mg/dL (ref 65–99)
GLUCOSE-CAPILLARY: 69 mg/dL (ref 65–99)
GLUCOSE-CAPILLARY: 101 mg/dL — AB (ref 65–99)
Glucose-Capillary: 86 mg/dL (ref 65–99)
Glucose-Capillary: 53 mg/dL — ABNORMAL LOW (ref 65–99)
Glucose-Capillary: 55 mg/dL — ABNORMAL LOW (ref 65–99)
Glucose-Capillary: 67 mg/dL (ref 65–99)
Glucose-Capillary: 99 mg/dL (ref 65–99)

## 2014-08-23 LAB — ANAEROBIC CULTURE: SPECIAL REQUESTS: NORMAL

## 2014-08-23 LAB — PREPARE RBC (CROSSMATCH)

## 2014-08-23 MED ORDER — FUROSEMIDE 10 MG/ML IJ SOLN
60.0000 mg | Freq: Two times a day (BID) | INTRAMUSCULAR | Status: DC
  Administered 2014-08-23 – 2014-08-29 (×12): 60 mg via INTRAVENOUS
  Filled 2014-08-23 (×13): qty 6

## 2014-08-23 MED ORDER — INSULIN ASPART 100 UNIT/ML ~~LOC~~ SOLN
0.0000 [IU] | Freq: Three times a day (TID) | SUBCUTANEOUS | Status: DC
Start: 2014-08-23 — End: 2014-08-26
  Administered 2014-08-24 (×2): 1 [IU] via SUBCUTANEOUS
  Administered 2014-08-25: 3 [IU] via SUBCUTANEOUS
  Administered 2014-08-25: 1 [IU] via SUBCUTANEOUS
  Administered 2014-08-26: 7 [IU] via SUBCUTANEOUS

## 2014-08-23 MED ORDER — INSULIN GLARGINE 100 UNIT/ML ~~LOC~~ SOLN
12.0000 [IU] | Freq: Every day | SUBCUTANEOUS | Status: DC
  Administered 2014-08-24 – 2014-08-28 (×5): 12 [IU] via SUBCUTANEOUS
  Filled 2014-08-23 (×7): qty 0.12

## 2014-08-23 MED ORDER — OXYCODONE HCL ER 10 MG PO T12A
20.0000 mg | EXTENDED_RELEASE_TABLET | Freq: Two times a day (BID) | ORAL | Status: DC
  Administered 2014-08-23 – 2014-09-02 (×20): 20 mg via ORAL
  Filled 2014-08-23 (×21): qty 2

## 2014-08-23 MED ORDER — INSULIN ASPART 100 UNIT/ML ~~LOC~~ SOLN
0.0000 [IU] | Freq: Every day | SUBCUTANEOUS | Status: DC
  Administered 2014-08-25: 2 [IU] via SUBCUTANEOUS
  Administered 2014-08-26: 4 [IU] via SUBCUTANEOUS

## 2014-08-23 MED ORDER — SODIUM CHLORIDE 0.9 % IV SOLN
Freq: Once | INTRAVENOUS | Status: AC
  Administered 2014-08-23: 12:00:00 via INTRAVENOUS

## 2014-08-23 MED ORDER — FUROSEMIDE 10 MG/ML IJ SOLN
20.0000 mg | Freq: Once | INTRAMUSCULAR | Status: AC
  Administered 2014-08-23: 20 mg via INTRAVENOUS

## 2014-08-23 MED ORDER — INSULIN GLARGINE 100 UNIT/ML ~~LOC~~ SOLN: 13.0000 [IU] | Freq: Every day | SUBCUTANEOUS | Status: DC

## 2014-08-23 MED ORDER — FUROSEMIDE 10 MG/ML IJ SOLN
INTRAMUSCULAR | Status: AC
  Administered 2014-08-23: 15:00:00
  Filled 2014-08-23: qty 4

## 2014-08-23 NOTE — Progress Notes (Signed)
Pt recheck CBG 78

## 2014-08-23 NOTE — Progress Notes (Signed)
Patient ID: Tracy [name], female   DOB: 11/30/1985, 29 y.o.   MRN: [id number] S:Feels a little better O:BP 116/53 mmHg  Pulse 82  Temp(Src) 98.5 F (36.9 C) (Axillary)  Resp 18  Ht 5\' 3"  (1.6 m)  Wt 87.1 kg (192 lb 0.3 oz)  BMI 34.02 kg/m2  SpO2 92%  LMP 07/18/2014  Intake/Output Summary (Last 24 hours) at 08/23/14 1107 Last data filed at 08/23/14 0900  Gross per 24 hour  Intake    360 ml  Output    800 ml  Net   -440 ml   Intake/Output: I/O last 3 completed shifts: In: 480 [P.O.:480] Out: 1700 [Urine:1700]  Intake/Output this shift:  Total I/O In: 120 [P.O.:120] Out: -  Weight change: -0.4 kg (-14.1 oz) Gen:WD WN WF  CVS:no rub Resp:cta [id number] Ext:+edema   Recent Labs Lab 08/18/14 1508 08/19/14 1436 08/20/14 0527 08/21/14 0500 08/22/14 0528 08/23/14 0525  NA 136 135 134* 134* 135 134*  K 3.6 3.3* 3.5 3.9 3.6 3.6  CL 100* 99* 99* 100* 101 100*  CO2 22 25 25 22 24 23   GLUCOSE 253* 141* 181* 233* 119* 84  BUN 35* 35* 35* 37* 39* 40*  CREATININE 5.11* 5.29* 5.71* 5.91* 6.04* 6.17*  ALBUMIN 1.6* 1.6* 1.5* 1.4* 1.4* 1.3*  CALCIUM 8.2* 8.0* 7.9* 7.8* 7.8* 8.0*  PHOS 3.7 3.9 4.2 4.5 4.2 4.3   Liver Function Tests:  Recent Labs Lab 08/21/14 0500 08/22/14 0528 08/23/14 0525  ALBUMIN 1.4* 1.4* 1.3*   No results for input(s): LIPASE, AMYLASE in the last 168 hours. No results for input(s): AMMONIA in the last 168 hours. CBC:  Recent Labs Lab 08/19/14 1436 08/20/14 0527 08/21/14 0500 08/22/14 0528 08/23/14 0525  WBC 19.9* 17.3* 15.6* 15.4* 14.1*  HGB 8.9* 8.4* 7.5* 7.2* 6.8*  HCT 27.1* 25.8* 22.7* 21.6* 20.8*  MCV 85.0 86.3 88.0 87.1 87.0  PLT 449* 416* 389 375 401*   Cardiac Enzymes:  Recent Labs Lab 08/22/14 1550  CKTOTAL 95   CBG:  Recent Labs Lab 08/22/14 1805 08/22/14 1848 08/22/14 2107 08/23/14 0207 08/23/14 0749  GLUCAP 65 88 81 72 86    Iron Studies: No results for input(s): IRON, TIBC, TRANSFERRIN, FERRITIN in the  last 72 hours. Studies/Results: Dg Chest 2 View  08/21/2014   CLINICAL DATA:  Cough and hypoxia for 3 days. Acute renal failure superimposed on chronic kidney disease.  EXAM: CHEST  2 VIEW  COMPARISON:  01/21/2014  FINDINGS: Moderate bilateral pleural effusions and bilateral lower lobe atelectasis or infiltrates are new since previous study. Heart size is normal.  New right jugular central venous catheter is seen with tip overlying the mid right atrium. No evidence of pneumothorax.  IMPRESSION: New moderate bilateral pleural effusions and bilateral lower lobe atelectasis versus infiltrates.  New right jugular central venous catheter tip overlies the right atrium. No pneumothorax visualized.   Electronically Signed   By: Earle Gell M.D.   On: 08/21/2014 14:58   . sodium chloride   Intravenous Once  . amLODipine  10 mg Oral Daily  .  ceFAZolin (ANCEF) IV  2 g Intravenous Q24H  . darbepoetin (ARANESP) injection - NON-DIALYSIS  100 mcg Subcutaneous Q Sun-1800  . furosemide  20 mg Intravenous Once  . furosemide  60 mg Intravenous Q12H  . heparin  5,000 Units Subcutaneous 3 times per day  . insulin aspart  0-15 Units Subcutaneous TID WC  . insulin aspart  0-5 Units Subcutaneous QHS  .  insulin aspart  2 Units Subcutaneous TID WC  . insulin glargine  15 Units Subcutaneous Daily  . levothyroxine  100 mcg Oral QAC breakfast  . metoprolol tartrate  50 mg Oral BID  . multivitamin with minerals  1 tablet Oral Daily  . OxyCODONE  10 mg Oral Q12H  . senna-docusate  2 tablet Oral QHS  . sodium chloride  1,000 mL Intravenous Once  . sodium chloride  3 mL Intravenous Q12H    BMET    Component Value Date/Time   NA 134* 08/23/2014 0525   NA 133* 01/18/2013 1106   K 3.6 08/23/2014 0525   CL 100* 08/23/2014 0525   CO2 23 08/23/2014 0525   GLUCOSE 84 08/23/2014 0525   GLUCOSE 929* 01/18/2013 1106   BUN 40* 08/23/2014 0525   BUN 37* 01/18/2013 1106   CREATININE 6.17* 08/23/2014 0525   CALCIUM 8.0*  08/23/2014 0525   GFRNONAA 8* 08/23/2014 0525   GFRAA 10* 08/23/2014 0525   CBC    Component Value Date/Time   WBC 14.1* 08/23/2014 0525   WBC 20.0* 01/18/2013 1106   RBC 2.39* 08/23/2014 0525   RBC 2.92* 08/10/2014 1220   RBC 3.70* 01/18/2013 1106   HGB 6.8* 08/23/2014 0525   HCT 20.8* 08/23/2014 0525   PLT 401* 08/23/2014 0525   MCV 87.0 08/23/2014 0525   MCH 28.5 08/23/2014 0525   MCH 29.5 01/18/2013 1106   MCHC 32.7 08/23/2014 0525   MCHC 31.9 01/18/2013 1106   RDW 15.9* 08/23/2014 0525   RDW 12.7 01/18/2013 1106   LYMPHSABS 0.9 08/13/2014 0540   LYMPHSABS 1.4 01/18/2013 1106   MONOABS 1.4* 08/13/2014 0540   EOSABS 0.1 08/13/2014 0540   EOSABS 0.0 01/18/2013 1106   BASOSABS 0.1 08/13/2014 0540   BASOSABS 0.0 01/18/2013 1106     Assessment/Plan:  1. AKI/CKD- has advanced CKD at baseline due to poorly controlled DM and HTN further complicated by NSAIDs as well as likely ischemic ATN. Increased UOP. Underwent HD on 7/5 and 08/17/14 and since her last treatment her scr continues to slowly rise.  1. Hold off on HD for now and continue to follow closely 2. No indication for dialysis at this time, however she is not stable for discharge due to possible need in the near future 3. Discussed treatment options including dialysis as well as kidney pancrease transplant (was under our care until January 2015 when she lost her insurance/job and has not come back since. Has been referred to [facility] by her endocrinologist however if she requires longterm RRT, she will need to re-establish with our group after discharge). 2. Pyomyositis- s/p I&D of right arm as well as right thigh/leg with indwelling drains. On Ancef for MSSA 3. CKD stage 4 due to diabetic nephropathy- pt with h/o poorly controlled type 1 DM now with progressive CKD (Scr almost doubled since December 2015 with baseline of 3 and no labs until June 2016) 1. Discussed need for better DM control 2. Will need referral for  kidney pancreas once she stabilizes 3. Unclear if she will require ongoing HD (if she doesn't start to improve) 4. Hyponatremia- due to #1, improved with HD 5. DM- poorly controlled, management per primary svc 6. Anemia- likely due to CKD s/p Aranesp 08/21/14.  7. Vascular access- will likely need tunneled HD cath if she does not have return of renal function. Will need to hold off on AVF until pyomyositis has resolved.  [place] [facility]

## 2014-08-23 NOTE — Progress Notes (Signed)
PT walked with pt in hall with walker.  PT recommends a Prafo boot for right foot, paged Dr. [place] Pink for order.

## 2014-08-23 NOTE — Progress Notes (Signed)
Patient ID: Tracy [name], female   DOB: 04/30/1985, 30 y.o.   MRN: [id number]         [facility]    Date of Admission:  08/09/2014   Total days of antibiotics 15 Day 7 of cefazolin  Principal Problem:   Myositis Active Problems:   Proliferative diabetic retinopathy   Hypothyroidism   Normocytic anemia   Acute renal failure superimposed on stage 4 chronic kidney disease   Seizure   Sepsis   DM I (diabetes mellitus, type I), uncontrolled   Essential hypertension   Acute respiratory failure with hypoxia   . sodium chloride   Intravenous Once  . amLODipine  10 mg Oral Daily  .  ceFAZolin (ANCEF) IV  2 g Intravenous Q24H  . darbepoetin (ARANESP) injection - NON-DIALYSIS  100 mcg Subcutaneous Q Sun-1800  . furosemide  20 mg Intravenous Once  . furosemide  60 mg Intravenous Q12H  . heparin  5,000 Units Subcutaneous 3 times per day  . insulin aspart  0-15 Units Subcutaneous TID WC  . insulin aspart  0-5 Units Subcutaneous QHS  . insulin aspart  2 Units Subcutaneous TID WC  . insulin glargine  15 Units Subcutaneous Daily  . levothyroxine  100 mcg Oral QAC breakfast  . metoprolol tartrate  50 mg Oral BID  . multivitamin with minerals  1 tablet Oral Daily  . OxyCODONE  10 mg Oral Q12H  . senna-docusate  2 tablet Oral QHS  . sodium chloride  1,000 mL Intravenous Once  . sodium chloride  3 mL Intravenous Q12H    Subjective: Tracy [name] complains of moderate pain in her left thigh from her surgical drainage performed on 7/12. She says her cough is improving. She is "sick of being in the hospital" and is "feeling down."  Review of Systems: Pertinent items are noted in HPI.  Past Medical History  Diagnosis Date  . Thyroid enlargement     "not on medication at this time"  . Urinary tract infection     hx of  . Anemia     presently on iron supplement  .  Diabetes mellitus     insulin pump   . Hypothyroidism   . Anxiety   . HSV-2 (herpes simplex virus 2) infection   . HSV-1 (herpes simplex virus 1) infection   . Detached retina   . Yeast infection     took diflucan saturday   . Hypertension   . Irregular periods 07/05/2014  . Vaginal discharge 07/05/2014    History  Substance Use Topics  . Smoking status: Never Smoker   . Smokeless tobacco: Never Used  . Alcohol Use: No    Family History  Problem Relation Age of Onset  . Cancer Paternal Grandfather     prostate  . Hyperlipidemia Paternal Grandfather   . Stroke Paternal Grandfather    Allergies  Allergen Reactions  . Penicillins Hives and Swelling  . Sulfa Antibiotics Hives    OBJECTIVE: Blood pressure 116/53, pulse 82, temperature 98.5 F (36.9 C), temperature source Axillary, resp. rate 18, height 5\' 3"  (1.6 m), weight 87.1 kg (192 lb 0.3 oz), last menstrual period 07/18/2014, SpO2 92 %. General: Lying in bed, lethargic but conversational. Skin: Dry, intact. Right medial thigh with wound-vac in place with minimal erythema.  Lab Results Lab Results  Component Value Date   WBC 14.1* 08/23/2014   HGB 6.8* 08/23/2014   HCT 20.8* 08/23/2014   MCV 87.0 08/23/2014  PLT 401* 08/23/2014    Lab Results  Component Value Date   CREATININE 6.17* 08/23/2014   BUN 40* 08/23/2014   NA 134* 08/23/2014   K 3.6 08/23/2014   CL 100* 08/23/2014   CO2 23 08/23/2014    Lab Results  Component Value Date   ALT 18 08/10/2014   AST 38 08/10/2014   ALKPHOS 166* 08/10/2014   BILITOT 0.6 08/10/2014     Microbiology: Recent Results (from the past 240 hour(s))  Anaerobic culture     Status: None   Collection Time: 08/14/14  2:32 PM  Result Value Ref Range Status   Specimen Description FLUID  Final   Special Requests RIGHT ARM FLUID A  Final   Gram Stain   Final    MODERATE WBC PRESENT,BOTH PMN AND MONONUCLEAR NO SQUAMOUS EPITHELIAL CELLS SEEN RARE GRAM POSITIVE COCCI IN  PAIRS IN CLUSTERS Performed at [facility]    Culture   Final    NO ANAEROBES ISOLATED Performed at [facility]    Report Status 08/19/2014 FINAL  Final  Culture, routine-abscess     Status: None   Collection Time: 08/14/14  2:32 PM  Result Value Ref Range Status   Specimen Description ARM RIGHT  Final   Special Requests NONE  Final   Gram Stain   Final    FEW WBC PRESENT, PREDOMINANTLY PMN NO SQUAMOUS EPITHELIAL CELLS SEEN RARE GRAM POSITIVE COCCI IN CLUSTERS Performed at [facility]    Culture   Final    FEW STAPHYLOCOCCUS AUREUS Note: RIFAMPIN AND GENTAMICIN SHOULD NOT BE USED AS SINGLE DRUGS FOR TREATMENT OF STAPH INFECTIONS. Performed at [facility]    Report Status 08/17/2014 FINAL  Final   Organism ID, Bacteria STAPHYLOCOCCUS AUREUS  Final      Susceptibility   Staphylococcus aureus - MIC*    CLINDAMYCIN <=0.25 SENSITIVE Sensitive     ERYTHROMYCIN <=0.25 SENSITIVE Sensitive     GENTAMICIN <=0.5 SENSITIVE Sensitive     LEVOFLOXACIN 0.25 SENSITIVE Sensitive     OXACILLIN <=0.25 SENSITIVE Sensitive     PENICILLIN 0.06 SENSITIVE Sensitive     RIFAMPIN <=0.5 SENSITIVE Sensitive     TRIMETH/SULFA <=10 SENSITIVE Sensitive     VANCOMYCIN 1 SENSITIVE Sensitive     TETRACYCLINE <=1 SENSITIVE Sensitive     MOXIFLOXACIN <=0.25 SENSITIVE Sensitive     * FEW STAPHYLOCOCCUS AUREUS  Anaerobic culture     Status: None   Collection Time: 08/14/14  2:36 PM  Result Value Ref Range Status   Specimen Description TISSUE  Final   Special Requests RIGHT ARM B  Final   Gram Stain   Final    NO WBC SEEN NO SQUAMOUS EPITHELIAL CELLS SEEN RARE GRAM POSITIVE COCCI IN PAIRS IN CLUSTERS Performed at [facility]    Culture   Final    NO ANAEROBES ISOLATED Performed at [facility]    Report Status 08/19/2014 FINAL  Final  Tissue culture     Status: None   Collection Time: 08/14/14  2:36 PM  Result Value Ref Range Status    Specimen Description TISSUE  Final   Special Requests RIGHT ARM B  Final   Gram Stain   Final    RARE WBC PRESENT, PREDOMINANTLY PMN NO SQUAMOUS EPITHELIAL CELLS SEEN RARE GRAM POSITIVE COCCI IN PAIRS Performed at [facility]    Culture   Final    MODERATE STAPHYLOCOCCUS AUREUS Note: RIFAMPIN AND GENTAMICIN SHOULD NOT  BE USED AS SINGLE DRUGS FOR TREATMENT OF STAPH INFECTIONS. Performed at [facility]    Report Status 08/17/2014 FINAL  Final   Organism ID, Bacteria STAPHYLOCOCCUS AUREUS  Final      Susceptibility   Staphylococcus aureus - MIC*    CLINDAMYCIN <=0.25 SENSITIVE Sensitive     ERYTHROMYCIN <=0.25 SENSITIVE Sensitive     GENTAMICIN <=0.5 SENSITIVE Sensitive     LEVOFLOXACIN 0.25 SENSITIVE Sensitive     OXACILLIN <=0.25 SENSITIVE Sensitive     PENICILLIN 0.06 SENSITIVE Sensitive     RIFAMPIN <=0.5 SENSITIVE Sensitive     TRIMETH/SULFA <=10 SENSITIVE Sensitive     VANCOMYCIN 1 SENSITIVE Sensitive     TETRACYCLINE <=1 SENSITIVE Sensitive     MOXIFLOXACIN <=0.25 SENSITIVE Sensitive     * MODERATE STAPHYLOCOCCUS AUREUS  Culture, routine-abscess     Status: None   Collection Time: 08/17/14  3:38 PM  Result Value Ref Range Status   Specimen Description ABSCESS RIGHT THIGH  Final   Special Requests Normal  Final   Gram Stain   Final    FEW WBC PRESENT, PREDOMINANTLY MONONUCLEAR NO SQUAMOUS EPITHELIAL CELLS SEEN MODERATE GRAM POSITIVE COCCI IN PAIRS IN CLUSTERS Performed at [facility]    Culture   Final    ABUNDANT STAPHYLOCOCCUS AUREUS Note: RIFAMPIN AND GENTAMICIN SHOULD NOT BE USED AS SINGLE DRUGS FOR TREATMENT OF STAPH INFECTIONS. Performed at [facility]    Report Status 08/21/2014 FINAL  Final   Organism ID, Bacteria STAPHYLOCOCCUS AUREUS  Final      Susceptibility   Staphylococcus aureus - MIC*    CLINDAMYCIN <=0.25 SENSITIVE Sensitive     ERYTHROMYCIN <=0.25 SENSITIVE Sensitive     GENTAMICIN <=0.5 SENSITIVE Sensitive      LEVOFLOXACIN 0.25 SENSITIVE Sensitive     OXACILLIN <=0.25 SENSITIVE Sensitive     PENICILLIN 0.06 SENSITIVE Sensitive     RIFAMPIN <=0.5 SENSITIVE Sensitive     TRIMETH/SULFA <=10 SENSITIVE Sensitive     VANCOMYCIN <=0.5 SENSITIVE Sensitive     TETRACYCLINE <=1 SENSITIVE Sensitive     MOXIFLOXACIN <=0.25 SENSITIVE Sensitive     * ABUNDANT STAPHYLOCOCCUS AUREUS  Anaerobic culture     Status: None (Preliminary result)   Collection Time: 08/17/14  3:38 PM  Result Value Ref Range Status   Specimen Description ABSCESS RIGHT THIGH  Final   Special Requests Normal  Final   Gram Stain   Final    FEW WBC PRESENT, PREDOMINANTLY MONONUCLEAR NO SQUAMOUS EPITHELIAL CELLS SEEN MODERATE GRAM POSITIVE COCCI IN PAIRS IN CLUSTERS Performed at [facility]    Culture   Final    NO ANAEROBES ISOLATED; CULTURE IN PROGRESS FOR 5 DAYS Performed at [facility]    Report Status PENDING  Incomplete  Anaerobic culture     Status: None (Preliminary result)   Collection Time: 08/20/14 10:40 AM  Result Value Ref Range Status   Specimen Description TISSUE  Final   Special Requests DEEP THIGH MUSCLE POF ANCEF CUP A  Final   Gram Stain   Final    ABUNDANT WBC PRESENT,BOTH PMN AND MONONUCLEAR NO SQUAMOUS EPITHELIAL CELLS SEEN RARE GRAM POSITIVE COCCI IN CLUSTERS Performed at [facility]    Culture   Final    NO ANAEROBES ISOLATED; CULTURE IN PROGRESS FOR 5 DAYS Performed at [facility]    Report Status PENDING  Incomplete  Tissue culture     Status: None (Preliminary result)   Collection  Time: 08/20/14 10:40 AM  Result Value Ref Range Status   Specimen Description TISSUE THIGH  Final   Special Requests POF ON ANCEF  Final   Gram Stain   Final    MODERATE WBC PRESENT,BOTH PMN AND MONONUCLEAR NO SQUAMOUS EPITHELIAL CELLS SEEN FEW GRAM POSITIVE COCCI IN CLUSTERS Performed at [facility]    Culture   Final    FEW STAPHYLOCOCCUS AUREUS Note: RIFAMPIN  AND GENTAMICIN SHOULD NOT BE USED AS SINGLE DRUGS FOR TREATMENT OF STAPH INFECTIONS. 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 Both have been surgically drained by orthopedics. We recommend continuing cefazolin for at least a few more weeks. We will follow the surgical tissue cultures of her right thigh. She has not received dialysis in 5 days; we will be following nephrology's notes to determine the method of antibiotic delivery.  Plan: 1. Continue IV cefazolin for at least a few more weeks  Loleta Chance, MD [facility] 08/23/2014, 10:39 AM   Addendum: Given that she has had persistently positive cultures growing MSSA while on antibiotic therapy she will need to stay on antibiotics for at least 2-3 more weeks. There is an option of converting to oral cephalexin if she continues to improve.  Michel Bickers, MD [facility] [place] [facility] [phone] pager   [phone] cell 08/23/2014, 4:59 PM

## 2014-08-23 NOTE — Progress Notes (Signed)
Pt CBG re-check 53, 4 oz jucie given.

## 2014-08-23 NOTE — Progress Notes (Signed)
CBG re-check 99

## 2014-08-23 NOTE — Progress Notes (Signed)
Patient ID: Tracy [name], female   DOB: [birth year], 29 y.o.   MRN: [id number] Seen and examined.  No significant changes.  White blood cell count is the lowest it's been at 14 at this juncture. I discussed her these issues.  The arm is fairly unchanged. There is no erythema or complicating features or other issues. She has a small bandage on the arm without drainage.  At present juncture we'll continue observatory care.  Patient understands all issues plans etc.  [name] Md

## 2014-08-23 NOTE — Progress Notes (Signed)
CRITICAL VALUE ALERT  Critical value received:  Hemoglobin 6.8  Date of notification:  08/23/14  Time of notification:  0647  Critical value read back:Yes.    Nurse who received alert:  Dorothea Glassman   MD notified (1st page):  Chaney Malling  Time of first page:  [phone]  Responding MD:  [phone]  Time MD responded:  Chaney Malling

## 2014-08-23 NOTE — Progress Notes (Signed)
Orthopedic Tech Progress Note Patient Details:  Tracy [name] [birth year] [id number]  Ortho Devices Type of Ortho Device: Louretta Parma boot Ortho Device/Splint Location: RLE Ortho Device/Splint Interventions: Ordered, Application   Braulio Bosch 08/23/2014, 9:46 PM

## 2014-08-23 NOTE — Progress Notes (Signed)
Pt CBG 55 snack given and juice. Pt alert and oriented.

## 2014-08-23 NOTE — Progress Notes (Signed)
Occupational Therapy Treatment Patient Details Name: Tracy [name] MRN: [id number] DOB: 15-Aug-1985 Today's Date: 08/23/2014    History of present illness Myositis of the right arm and right thigh. Most likely post physical strain 2 weeks ago while throwing horse feed. s/p I&D and biopsies R thigh and UE + for MSSA. Pt with renal failure, on temporary HD. PMH: uncontrolled DM, hypothyroidism.   OT comments  Pt making steady progress still need supervision for toilet transfers and toileting using the RW and 3:1.  Still with VAC and O2 dependent as well with sats decreasing to 84% on 3Ls with mobility and increasing to 88% on 4Ls nasal cannula.  Pt needing assistance as well with moving the RLE into and out of the bed as well.  Recommend PRAFO for the RLE as in bed it rests in plantar flexion and external rotation.  Follow Up Recommendations  No OT follow up    Equipment Recommendations  None recommended by OT       Precautions / Restrictions Precautions Precautions: Fall Precaution Comments: wound vac RLE Restrictions Weight Bearing Restrictions: No       Mobility Bed Mobility   Bed Mobility: Supine to Sit;Sit to Supine     Supine to sit: Min assist Sit to supine: Min assist   General bed mobility comments: Pt requires assistance for moving the RLE into and out of the bed.   Transfers Overall transfer level: Needs assistance Equipment used: Rolling walker (2 wheeled) Transfers: Sit to/from [facility] Sit to Stand: Supervision Stand pivot transfers: Supervision            Balance     Sitting balance-Leahy Scale: Fair       Standing balance-Leahy Scale: Good                     ADL       Grooming: Wash/dry hands;Supervision/safety;Standing               Lower Body Dressing: Maximal assistance Lower Body Dressing Details (indicate cue type and reason): donning gripper socks Toilet Transfer:  Supervision/safety;BSC;RW;Ambulation   Toileting- Clothing Manipulation and Hygiene: Sit to/from stand;Supervision/safety       Functional mobility during ADLs: Min guard General ADL Comments: Pt utilized the RW for support with mobility.        Vision                            Cognition   Behavior During Therapy: WFL for tasks assessed/performed Overall Cognitive Status: Within Functional Limits for tasks assessed                                    Pertinent Vitals/ Pain       Pain Assessment: Faces Faces Pain Scale: Hurts even more Pain Location: RLE Pain Intervention(s): Limited activity within patient's tolerance;Premedicated before session         Frequency Min 2X/week     Progress Toward Goals  OT Goals(current goals can now be found in the care plan section)  Progress towards OT goals: Progressing toward goals  Acute Rehab OT Goals OT Goal Formulation: With patient Time For Goal Achievement: 08/30/14 Potential to Achieve Goals: Good  Plan Discharge plan remains appropriate       End of Session Equipment Utilized During Treatment: Rolling walker;Oxygen   Activity Tolerance Patient  tolerated treatment well   Patient Left in bed;with call bell/phone within reach;with family/visitor present   Nurse Communication Mobility status;Other (comment) (Need for [facility] for RLE positioning)        Time: [id number] OT Time Calculation (min): 41 min  Charges: OT General Charges $OT Visit: 1 Procedure OT Treatments $Self Care/Home Management : 23-37 mins $Therapeutic Activity: 8-22 mins  Mailey Landstrom OTR/L 08/23/2014, 4:37 PM

## 2014-08-23 NOTE — Progress Notes (Addendum)
Inpatient Diabetes Program Recommendations  AACE/ADA: New Consensus Statement on Inpatient Glycemic Control (2013)  Target Ranges:  Prepandial:   less than 140 mg/dL      Peak postprandial:   less than 180 mg/dL (1-2 hours)      Critically ill patients:  140 - 180 mg/dL    Results for MALAAK, EBELING (MRN [id number]) as of 08/23/2014 10:33  Ref. Range 08/21/2014 07:47 08/21/2014 11:21 08/21/2014 16:26 08/21/2014 17:51 08/21/2014 21:24  Glucose-Capillary Latest Ref Range: 65-99 mg/dL 213 (H) 133 (H) 44 (LL) 70 91    Results for ROZELIA, SCHLOSSER (MRN [id number]) as of 08/23/2014 10:33  Ref. Range 08/22/2014 07:56 08/22/2014 11:47 08/22/2014 17:29 08/22/2014 18:05 08/22/2014 18:48 08/22/2014 21:07  Glucose-Capillary Latest Ref Range: 65-99 mg/dL 139 (H) 122 (H) 64 (L) 65 88 81    Home DM Meds: Lantus 15 units QAM       Novolog 5 units tidwc  Current DM Orders: Lantus 15 units QAM            Novolog Moderate SSI (0-15 units) TID AC + HS            Novolog 2 units tidwc     -Patient with very poor PO intake.  -Having Hypoglycemia the last 2 days in the late afternoon.     MD- Please consider the following in-hospital insulin adjustments:  1. Discontinue Novolog 2 units tidwc for now (can resume once patient's PO improves)  2. Decrease Novolog SSI to Sensitive scale (0-9 units) [Currently ordered as Moderate scale- 0-15 units]  3. Decrease Lantus to 12 units QAM    Will follow Wyn Quaker RN, MSN, CDE Diabetes Coordinator Inpatient Glycemic Control Team Team Pager: [phone] (8a-5p)

## 2014-08-23 NOTE — Progress Notes (Signed)
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 Penicillins Hives and Swelling  . Sulfa Antibiotics Hives    Patient Measurements: Height: 5\' 3"  (160 cm) Weight: 192 lb 0.3 oz (87.1 kg) IBW/kg (Calculated) : 52.4 Adjusted Body Weight: n/a  Vital Signs: Temp: 98.8 F (37.1 C) (07/12 1151) Temp Source: Oral (07/12 1151) BP: 114/64 mmHg (07/12 1151) Pulse Rate: 72 (07/12 1151) Intake/Output from previous day: 07/11 0701 - 07/12 0700 In: 480 [P.O.:480] Out: 1500 [Urine:1500] Intake/Output from this shift: Total I/O In: 120 [P.O.:120] Out: -   Labs:  Recent Labs  08/21/14 0500 08/22/14 0528 08/23/14 0525  WBC 15.6* 15.4* 14.1*  HGB 7.5* 7.2* 6.8*  PLT 389 375 401*  CREATININE 5.91* 6.04* 6.17*   Estimated Creatinine Clearance: 14 mL/min (by C-G formula based on Cr of 6.17). No results for input(s): VANCOTROUGH, VANCOPEAK, VANCORANDOM, GENTTROUGH, GENTPEAK, GENTRANDOM, TOBRATROUGH, TOBRAPEAK, TOBRARND, AMIKACINPEAK, AMIKACINTROU, AMIKACIN in the last 72 hours.   Microbiology:     Assessment: Ancef # 7, abx #15 for MSSA myositis/myonecrosis on RUE and RLE.   Pt with hx of allergic rxn to PCN: per pt she had urticaria to PCN in the 9th grade, no respiratory distress.  She got cefepime 2 mg x 1 on 01/20/2014 with no reaction.  She has gotten first dose of ancef with no allergic rxn per RN report. S/p I&D on 07/09 New HD pt - ARF on CKD.  Unclear if she has progressed to ESRD. To be assessed daily for HD needs/plans. Afebrile, WBC down sl to 14.1. ID following. Needs several more wks of tx Ortho surgeon also following for wound. Looking good. Sutures to come out later this week or over the weekend. Still has a drain in leg  7/3 debridement in OR 7/6 R to biopsy and aspirate muscle of R thigh  vanc 6/26 >>7/6    7/5 VR prior to pt's first HD = 29 mcg/ml (goal  typically 15-25 mcg/ml prior to HD).  aztreonam 6/28 >>7/4 clinda 7/4 >>7/5 ancef 7/6 >>  6/28 urine cx: >100,000 lactobacillus F 6/28 blood cx: neg 7/3: Tissue/Abscess x 2: MSSA 7/6 right thigh abscess - abundant Staph aureus- MSSA 7/6 right thigh abscess anaerobic - none seen to date (7/11) 7/9 thigh tissue: few Staph aureus - pan-sensitive 7/9 deep thigh muscle anaerobic - none seen to date (7/11)  Plan: -Ancef 2 gm IV q24, give after HD on HD days if HD for now. If no HD and renal function improves, will need to reassess dosing -f/u for HD sessions / changes  Uvaldo Rising, BCPS  Clinical Pharmacist Pager [phone]  08/23/2014 1:48 PM

## 2014-08-23 NOTE — Progress Notes (Signed)
Pt CBG recheck 66, pt eating lunch will re-check

## 2014-08-23 NOTE — Progress Notes (Signed)
     Subjective:  POD#3 Irrigation and debridement of R thigh. Patient reports pain as moderate.  Resting comfortably in bed today.  Was up in the halls multiple times today and tolerated ambulation with mild discomfort.  Wound vac removed today and simple dressing applied.  Will remain on till follow up in clinic in one week.  Must stay clean and dry.  Stitches will be removed at follow-up.   Objective:   VITALS:   Filed Vitals:   08/23/14 1151 08/23/14 1500 08/23/14 1603 08/23/14 1610  BP: 114/64 116/67    Pulse: 72 72    Temp: 98.8 F (37.1 C) 98.5 F (36.9 C)    TempSrc: Oral Oral    Resp: 16 16    Height:      Weight:      SpO2: 94% 94% 88% 96%    Neurologically intact ABD soft Neurovascular intact Sensation intact distally Intact pulses distally Dorsiflexion/Plantar flexion intact Incision: dressing C/D/I   Lab Results  Component Value Date   WBC 14.1* 08/23/2014   HGB 6.8* 08/23/2014   HCT 20.8* 08/23/2014   MCV 87.0 08/23/2014   PLT 401* 08/23/2014   BMET    Component Value Date/Time   NA 134* 08/23/2014 0525   NA 133* 01/18/2013 1106   K 3.6 08/23/2014 0525   CL 100* 08/23/2014 0525   CO2 23 08/23/2014 0525   GLUCOSE 84 08/23/2014 0525   GLUCOSE 929* 01/18/2013 1106   BUN 40* 08/23/2014 0525   BUN 37* 01/18/2013 1106   CREATININE 6.17* 08/23/2014 0525   CALCIUM 8.0* 08/23/2014 0525   GFRNONAA 8* 08/23/2014 0525   GFRAA 10* 08/23/2014 0525     Assessment/Plan: 3 Days Post-Op   Principal Problem:   Myositis Active Problems:   Proliferative diabetic retinopathy   Hypothyroidism   Normocytic anemia   Acute renal failure superimposed on stage 4 chronic kidney disease   Seizure   Sepsis   DM I (diabetes mellitus, type I), uncontrolled   Essential hypertension   Acute respiratory failure with hypoxia   Up with therapy WBAT in the RLE Continue abx per infectious disease. Wound vac removed today at bedside.  New dressing applied. We will  follow as outpatient to remove stitches.    Tracy [name] 08/23/2014, 4:47 PM Cell [phone]

## 2014-08-23 NOTE — Progress Notes (Signed)
PROGRESS NOTE  Tracy [name] [id number] DOB: 01-21-1986 DOA: 08/09/2014 PCP: [name]  HPI/Recap of past 24 hours: This is a 29 year old Caucasian female with history of diabetes mellitus on insulin pump but poor control, hypothyroidism poor compliance she stopped taking her Synthroid 2 months ago for no known reason, chronic kidney disease stage IV baseline creatinine close to 3, who was admitted to the hospital 11 days ago for 2 week history of right arm and right leg pain, upon further workup with MRI was found that she had extensive myositis in both right-sided extremities. She was initially seen at [facility] in [place] and then transferred to [facility].  She was seen here by hand surgery, general surgery, orthopedic surgery, renal, infectious disease, phone consultation was made by me with rheumatologist Dr. Amil Amen. Initially no clear reason of myositis could be found, however since her clinical course did not show any improvement she was taken to the OR first by Dr. Amedeo Plenty about 5 days ago and right arm fluid was drained which was suggestive of infectious myositis, she has been on IV antibiotics throughout her hospital stay. On 08/20/2014 she returned to the OR again by orthopedic surgery for drainage of right leg fluid collection. Her workup certainly suggests infectious myositis however reason for her bacteremia is not clear at all. ID is on board as well.  Note due to her illness patient's has progressed from chronic kidney disease stage IV to close to ESRD, currently under dialysis for the last 7 days. Renal to decide whether this is ESRD or not.  In addition, nursing noted on 7/10 morning that patient is hypoxic off of oxygen and currently requiring 3 L to say above 90% patient has a mild nonproductive cough and some low-grade temperatures. Chest x-ray done only noted mild pleural effusions but no infiltrate.  Today, hemoglobin dropped below 7 so receiving  blood transfusion. Main complaint still is of pain not well controlled as well as cough and dyspnea on exertion.  Assessment/Plan: Principal Problem:   Myositis: Possibly from post physical string 2 weeks prior from throwing horse feed along with muscle injury with secondary infection. Status post right arm muscle biopsy along with fluid drainage and JP drain as well as right leg ET guided drainage. Source of bacteremia unclear. Echocardiogram normal.  White blood cell count continues to decrease  Active Problems:   Proliferative diabetic retinopathy   Hypothyroidism: We started on Synthroid after noncompliance.    Normocytic anemia: Iron deficiency and also in part from renal failure and chronic disease. Status post 2 units packed red blood cells transfusion on 7/6    Acute renal failure superimposed on stage 4 chronic kidney disease: Previous creatinine baseline at 3. Possibly from hypovolemia from nausea and vomiting plus NSAID use prior to admission causing ATN. Voiding effort toxins. Hydrating and renal ultrasound noting cyst but no obstruction. Urine output stable. Followed by nephrology. Interventional radiology placed ounces catheter on 7/4 and patient underwent dialysis on 7/5. Currently monitoring, but no plans for further dialysis unit    DM I (diabetes mellitus, type I), uncontrolled with renal disease: Poor outpatient control. A1c of 8.9. Currently on Lantus plus sliding scale.  CBGs slightly low and patient had episode of hypoglycemia, so decreased Lantus slightly, discontinued scheduled NovoLog and decreased sliding scale to sensitive.    Essential hypertension   Acute respiratory failure with hypoxia: Unclear etiology. Continue oxygen. Suspect volume overload. Patient is +16 L of fluid. Increased Lasix.. Have encouraged  inspirometer use, currently she is only at 500 mL.  Pain control: Had extensive discussion about short and long-acting pain medications. Have started extended  release OxyContin and ideally will be able to change her over to full by mouth soon    Code Status: Full code  Family Communication: Mother at the bedside  Disposition Plan: Continue inpatient until infection felt to be treated, decision made about further dialysis and hypoxia resolved/diuresed fully  Consultants:  Case discussed with rheumatology by phone.  Interventional radiology.  Infectious disease.  Nephrology  General surgery  Hand surgery  Orthopedic surgery  Procedures:  Right arm muscle biopsy plus fluid drainage and JP drain placement of right arm done 7/3  Right IJ dialysis catheter by interventional radiology 7/4  Dialysis session starting 7/5  Right leg ultrasound guided aspiration by interventional radiology 7/6  Leg expiration with drainage of fluid done 7/9  Transfusion 2 units packed red blood cells then 7/6  Antibiotics:  IV vancomycin 6/28-7/5  IV Azactam 6/28-7/4  IV clindamycin 7/4-7/5  IV Ancef 7/6-7/9   Objective: BP 114/64 mmHg  Pulse 72  Temp(Src) 98.8 F (37.1 C) (Oral)  Resp 16  Ht 5\' 3"  (1.6 m)  Wt 87.1 kg (192 lb 0.3 oz)  BMI 34.02 kg/m2  SpO2 94%  LMP 07/18/2014  Intake/Output Summary (Last 24 hours) at 08/23/14 1449 Last data filed at 08/23/14 0900  Gross per 24 hour  Intake    360 ml  Output    500 ml  Net   -140 ml   Filed Weights   08/20/14 0513 08/21/14 0920 08/22/14 2100  Weight: 87.5 kg (192 lb 14.4 oz) 87.5 kg (192 lb 14.4 oz) 87.1 kg (192 lb 0.3 oz)    Exam:   General:  Alert and oriented 3, no acute distress  Cardiovascular: Regular rate and rhythm, S1-S2  Respiratory: decreased breath sounds bibasilar, poor inspiratory effort, few rales   Abdomen: Soft, nontender, nondistended, positive bowel sounds  Musculoskeletal: No clubbing or cyanosis, trace edema. Left lower extremity wrapped   Data Reviewed: Basic Metabolic Panel:  Recent Labs Lab 08/19/14 1436 08/20/14 0527 08/21/14 0500  08/22/14 0528 08/23/14 0525  NA 135 134* 134* 135 134*  K 3.3* 3.5 3.9 3.6 3.6  CL 99* 99* 100* 101 100*  CO2 25 25 22 24 23   GLUCOSE 141* 181* 233* 119* 84  BUN 35* 35* 37* 39* 40*  CREATININE 5.29* 5.71* 5.91* 6.04* 6.17*  CALCIUM 8.0* 7.9* 7.8* 7.8* 8.0*  PHOS 3.9 4.2 4.5 4.2 4.3   Liver Function Tests:  Recent Labs Lab 08/19/14 1436 08/20/14 0527 08/21/14 0500 08/22/14 0528 08/23/14 0525  ALBUMIN 1.6* 1.5* 1.4* 1.4* 1.3*   No results for input(s): LIPASE, AMYLASE in the last 168 hours. No results for input(s): AMMONIA in the last 168 hours. CBC:  Recent Labs Lab 08/19/14 1436 08/20/14 0527 08/21/14 0500 08/22/14 0528 08/23/14 0525  WBC 19.9* 17.3* 15.6* 15.4* 14.1*  HGB 8.9* 8.4* 7.5* 7.2* 6.8*  HCT 27.1* 25.8* 22.7* 21.6* 20.8*  MCV 85.0 86.3 88.0 87.1 87.0  PLT 449* 416* 389 375 401*   Cardiac Enzymes:    Recent Labs Lab 08/22/14 1550  CKTOTAL 95   BNP (last 3 results) No results for input(s): BNP in the last 8760 hours.  ProBNP (last 3 results) No results for input(s): PROBNP in the last 8760 hours.  CBG:  Recent Labs Lab 08/23/14 0749 08/23/14 1156 08/23/14 1220 08/23/14 1243 08/23/14 1317  GLUCAP 86 55*  53* 66 78    Recent Results (from the past 240 hour(s))  Anaerobic culture     Status: None   Collection Time: 08/14/14  2:32 PM  Result Value Ref Range Status   Specimen Description FLUID  Final   Special Requests RIGHT ARM FLUID A  Final   Gram Stain   Final    MODERATE WBC PRESENT,BOTH PMN AND MONONUCLEAR NO SQUAMOUS EPITHELIAL CELLS SEEN RARE GRAM POSITIVE COCCI IN PAIRS IN CLUSTERS Performed at [facility]    Culture   Final    NO ANAEROBES ISOLATED Performed at [facility]    Report Status 08/19/2014 FINAL  Final  Culture, routine-abscess     Status: None   Collection Time: 08/14/14  2:32 PM  Result Value Ref Range Status   Specimen Description ARM RIGHT  Final   Special Requests NONE  Final    Gram Stain   Final    FEW WBC PRESENT, PREDOMINANTLY PMN NO SQUAMOUS EPITHELIAL CELLS SEEN RARE GRAM POSITIVE COCCI IN CLUSTERS Performed at [facility]    Culture   Final    FEW STAPHYLOCOCCUS AUREUS Note: RIFAMPIN AND GENTAMICIN SHOULD NOT BE USED AS SINGLE DRUGS FOR TREATMENT OF STAPH INFECTIONS. Performed at [facility]    Report Status 08/17/2014 FINAL  Final   Organism ID, Bacteria STAPHYLOCOCCUS AUREUS  Final      Susceptibility   Staphylococcus aureus - MIC*    CLINDAMYCIN <=0.25 SENSITIVE Sensitive     ERYTHROMYCIN <=0.25 SENSITIVE Sensitive     GENTAMICIN <=0.5 SENSITIVE Sensitive     LEVOFLOXACIN 0.25 SENSITIVE Sensitive     OXACILLIN <=0.25 SENSITIVE Sensitive     PENICILLIN 0.06 SENSITIVE Sensitive     RIFAMPIN <=0.5 SENSITIVE Sensitive     TRIMETH/SULFA <=10 SENSITIVE Sensitive     VANCOMYCIN 1 SENSITIVE Sensitive     TETRACYCLINE <=1 SENSITIVE Sensitive     MOXIFLOXACIN <=0.25 SENSITIVE Sensitive     * FEW STAPHYLOCOCCUS AUREUS  Anaerobic culture     Status: None   Collection Time: 08/14/14  2:36 PM  Result Value Ref Range Status   Specimen Description TISSUE  Final   Special Requests RIGHT ARM B  Final   Gram Stain   Final    NO WBC SEEN NO SQUAMOUS EPITHELIAL CELLS SEEN RARE GRAM POSITIVE COCCI IN PAIRS IN CLUSTERS Performed at [facility]    Culture   Final    NO ANAEROBES ISOLATED Performed at [facility]    Report Status 08/19/2014 FINAL  Final  Tissue culture     Status: None   Collection Time: 08/14/14  2:36 PM  Result Value Ref Range Status   Specimen Description TISSUE  Final   Special Requests RIGHT ARM B  Final   Gram Stain   Final    RARE WBC PRESENT, PREDOMINANTLY PMN NO SQUAMOUS EPITHELIAL CELLS SEEN RARE GRAM POSITIVE COCCI IN PAIRS Performed at [facility]    Culture   Final    MODERATE STAPHYLOCOCCUS AUREUS Note: RIFAMPIN AND GENTAMICIN SHOULD NOT BE USED AS SINGLE DRUGS FOR  TREATMENT OF STAPH INFECTIONS. Performed at [facility]    Report Status 08/17/2014 FINAL  Final   Organism ID, Bacteria STAPHYLOCOCCUS AUREUS  Final      Susceptibility   Staphylococcus aureus - MIC*    CLINDAMYCIN <=0.25 SENSITIVE Sensitive     ERYTHROMYCIN <=0.25 SENSITIVE Sensitive     GENTAMICIN <=0.5 SENSITIVE Sensitive  LEVOFLOXACIN 0.25 SENSITIVE Sensitive     OXACILLIN <=0.25 SENSITIVE Sensitive     PENICILLIN 0.06 SENSITIVE Sensitive     RIFAMPIN <=0.5 SENSITIVE Sensitive     TRIMETH/SULFA <=10 SENSITIVE Sensitive     VANCOMYCIN 1 SENSITIVE Sensitive     TETRACYCLINE <=1 SENSITIVE Sensitive     MOXIFLOXACIN <=0.25 SENSITIVE Sensitive     * MODERATE STAPHYLOCOCCUS AUREUS  Culture, routine-abscess     Status: None   Collection Time: 08/17/14  3:38 PM  Result Value Ref Range Status   Specimen Description ABSCESS RIGHT THIGH  Final   Special Requests Normal  Final   Gram Stain   Final    FEW WBC PRESENT, PREDOMINANTLY MONONUCLEAR NO SQUAMOUS EPITHELIAL CELLS SEEN MODERATE GRAM POSITIVE COCCI IN PAIRS IN CLUSTERS Performed at [facility]    Culture   Final    ABUNDANT STAPHYLOCOCCUS AUREUS Note: RIFAMPIN AND GENTAMICIN SHOULD NOT BE USED AS SINGLE DRUGS FOR TREATMENT OF STAPH INFECTIONS. Performed at [facility]    Report Status 08/21/2014 FINAL  Final   Organism ID, Bacteria STAPHYLOCOCCUS AUREUS  Final      Susceptibility   Staphylococcus aureus - MIC*    CLINDAMYCIN <=0.25 SENSITIVE Sensitive     ERYTHROMYCIN <=0.25 SENSITIVE Sensitive     GENTAMICIN <=0.5 SENSITIVE Sensitive     LEVOFLOXACIN 0.25 SENSITIVE Sensitive     OXACILLIN <=0.25 SENSITIVE Sensitive     PENICILLIN 0.06 SENSITIVE Sensitive     RIFAMPIN <=0.5 SENSITIVE Sensitive     TRIMETH/SULFA <=10 SENSITIVE Sensitive     VANCOMYCIN <=0.5 SENSITIVE Sensitive     TETRACYCLINE <=1 SENSITIVE Sensitive     MOXIFLOXACIN <=0.25 SENSITIVE Sensitive     * ABUNDANT  STAPHYLOCOCCUS AUREUS  Anaerobic culture     Status: None   Collection Time: 08/17/14  3:38 PM  Result Value Ref Range Status   Specimen Description ABSCESS RIGHT THIGH  Final   Special Requests Normal  Final   Gram Stain   Final    FEW WBC PRESENT, PREDOMINANTLY MONONUCLEAR NO SQUAMOUS EPITHELIAL CELLS SEEN MODERATE GRAM POSITIVE COCCI IN PAIRS IN CLUSTERS Performed at [facility]    Culture   Final    NO ANAEROBES ISOLATED Performed at [facility]    Report Status 08/23/2014 FINAL  Final  Anaerobic culture     Status: None (Preliminary result)   Collection Time: 08/20/14 10:40 AM  Result Value Ref Range Status   Specimen Description TISSUE  Final   Special Requests DEEP THIGH MUSCLE POF ANCEF CUP A  Final   Gram Stain   Final    ABUNDANT WBC PRESENT,BOTH PMN AND MONONUCLEAR NO SQUAMOUS EPITHELIAL CELLS SEEN RARE GRAM POSITIVE COCCI IN CLUSTERS Performed at [facility]    Culture   Final    NO ANAEROBES ISOLATED; CULTURE IN PROGRESS FOR 5 DAYS Performed at [facility]    Report Status PENDING  Incomplete  Tissue culture     Status: None   Collection Time: 08/20/14 10:40 AM  Result Value Ref Range Status   Specimen Description TISSUE THIGH  Final   Special Requests POF ON ANCEF  Final   Gram Stain   Final    MODERATE WBC PRESENT,BOTH PMN AND MONONUCLEAR NO SQUAMOUS EPITHELIAL CELLS SEEN FEW GRAM POSITIVE COCCI IN CLUSTERS Performed at [facility]    Culture   Final    FEW STAPHYLOCOCCUS AUREUS Note: RIFAMPIN AND GENTAMICIN SHOULD NOT BE USED AS  SINGLE DRUGS FOR TREATMENT OF STAPH INFECTIONS. Performed at [facility]    Report Status 08/23/2014 FINAL  Final   Organism ID, Bacteria STAPHYLOCOCCUS AUREUS  Final      Susceptibility   Staphylococcus aureus - MIC*    CLINDAMYCIN <=0.25 SENSITIVE Sensitive     ERYTHROMYCIN <=0.25 SENSITIVE Sensitive     GENTAMICIN <=0.5 SENSITIVE Sensitive     LEVOFLOXACIN 0.25  SENSITIVE Sensitive     OXACILLIN <=0.25 SENSITIVE Sensitive     PENICILLIN 0.12 SENSITIVE Sensitive     RIFAMPIN <=0.5 SENSITIVE Sensitive     TRIMETH/SULFA <=10 SENSITIVE Sensitive     VANCOMYCIN 1 SENSITIVE Sensitive     TETRACYCLINE <=1 SENSITIVE Sensitive     MOXIFLOXACIN <=0.25 SENSITIVE Sensitive     * FEW STAPHYLOCOCCUS AUREUS     Studies: No results found.  Scheduled Meds: . amLODipine  10 mg Oral Daily  .  ceFAZolin (ANCEF) IV  2 g Intravenous Q24H  . darbepoetin (ARANESP) injection - NON-DIALYSIS  100 mcg Subcutaneous Q Sun-1800  . furosemide  20 mg Intravenous Once  . furosemide  60 mg Intravenous Q12H  . heparin  5,000 Units Subcutaneous 3 times per day  . insulin aspart  0-5 Units Subcutaneous QHS  . insulin aspart  0-9 Units Subcutaneous TID WC  . insulin glargine  12 Units Subcutaneous QHS  . levothyroxine  100 mcg Oral QAC breakfast  . metoprolol tartrate  50 mg Oral BID  . multivitamin with minerals  1 tablet Oral Daily  . OxyCODONE  20 mg Oral Q12H  . senna-docusate  2 tablet Oral QHS  . sodium chloride  1,000 mL Intravenous Once  . sodium chloride  3 mL Intravenous Q12H    Continuous Infusions: . sodium chloride       Time spent: 25 minutes  [facility] Pager [phone]. If 7PM-7AM, please contact night-coverage at [email or web address], password [facility] 08/23/2014, 2:49 PM  LOS: 14 days

## 2014-08-24 LAB — RENAL FUNCTION PANEL
ALBUMIN: 1.4 g/dL — AB (ref 3.5–5.0)
Anion gap: 10 (ref 5–15)
BUN: 45 mg/dL — ABNORMAL HIGH (ref 6–20)
CHLORIDE: 101 mmol/L (ref 101–111)
CO2: 24 mmol/L (ref 22–32)
CREATININE: 6.63 mg/dL — AB (ref 0.44–1.00)
Calcium: 7.9 mg/dL — ABNORMAL LOW (ref 8.9–10.3)
GFR calc Af Amer: 9 mL/min — ABNORMAL LOW (ref >60)
GFR calc non Af Amer: 8 mL/min — ABNORMAL LOW (ref >60)
Glucose, Bld: 114 mg/dL — ABNORMAL HIGH (ref 65–99)
PHOSPHORUS: 4.8 mg/dL — AB (ref 2.5–4.6)
POTASSIUM: 4.5 mmol/L (ref 3.5–5.1)
Sodium: 135 mmol/L (ref 135–145)

## 2014-08-24 LAB — CBC
HCT: 24.3 % — ABNORMAL LOW (ref 36.0–46.0)
Hemoglobin: 7.8 g/dL — ABNORMAL LOW (ref 12.0–15.0)
MCH: 27.9 pg (ref 26.0–34.0)
MCHC: 32.1 g/dL (ref 30.0–36.0)
MCV: 86.8 fL (ref 78.0–100.0)
Platelets: 425 10*3/uL — ABNORMAL HIGH (ref 150–400)
RBC: 2.8 MIL/uL — AB (ref 3.87–5.11)
RDW: 16 % — ABNORMAL HIGH (ref 11.5–15.5)
WBC: 14.6 10*3/uL — ABNORMAL HIGH (ref 4.0–10.5)

## 2014-08-24 LAB — TYPE AND SCREEN
ABO/RH(D): O POS
ANTIBODY SCREEN: NEGATIVE
Unit division: 0

## 2014-08-24 LAB — GLUCOSE, CAPILLARY
GLUCOSE-CAPILLARY: 104 mg/dL — AB (ref 65–99)
Glucose-Capillary: 129 mg/dL — ABNORMAL HIGH (ref 65–99)
Glucose-Capillary: 136 mg/dL — ABNORMAL HIGH (ref 65–99)
Glucose-Capillary: 145 mg/dL — ABNORMAL HIGH (ref 65–99)

## 2014-08-24 MED ORDER — NEPRO/CARBSTEADY PO LIQD
237.0000 mL | Freq: Two times a day (BID) | ORAL | Status: DC
  Administered 2014-08-25 (×2): 237 mL via ORAL

## 2014-08-24 MED ORDER — FUROSEMIDE 10 MG/ML IJ SOLN
INTRAMUSCULAR | Status: AC
  Filled 2014-08-24: qty 4

## 2014-08-24 MED ORDER — ALBUMIN HUMAN 5 % IV SOLN
12.5000 g | Freq: Once | INTRAVENOUS | Status: AC
  Administered 2014-08-24: 12.5 g via INTRAVENOUS
  Filled 2014-08-24: qty 250

## 2014-08-24 MED ORDER — MAGIC MOUTHWASH W/LIDOCAINE
15.0000 mL | Freq: Four times a day (QID) | ORAL | Status: DC | PRN
  Administered 2014-08-24 – 2014-08-29 (×5): 15 mL via ORAL
  Filled 2014-08-24 (×4): qty 15

## 2014-08-24 MED ORDER — MEGESTROL ACETATE 40 MG PO TABS
40.0000 mg | ORAL_TABLET | Freq: Every day | ORAL | Status: DC
  Filled 2014-08-24 (×3): qty 1

## 2014-08-24 NOTE — Progress Notes (Signed)
Patient ID: Tracy [name], female   DOB: [birth year], 29 y.o.   MRN: [id number] S:not eating much because of painful ulcer in her mouth O:BP 116/65 mmHg  Pulse 77  Temp(Src) 98.6 F (37 C) (Oral)  Resp 18  Ht 5\' 3"  (1.6 m)  Wt 87.1 kg (192 lb 0.3 oz)  BMI 34.02 kg/m2  SpO2 94%  LMP 07/18/2014  Intake/Output Summary (Last 24 hours) at 08/24/14 0918 Last data filed at 08/24/14 0600  Gross per 24 hour  Intake    870 ml  Output   1700 ml  Net   -830 ml   Intake/Output: I/O last 3 completed shifts: In: 1110 [P.O.:780; Blood:330] Out: 2200 [Urine:2200]  Intake/Output this shift:    Weight change:  Gen:WD WN WF in NAD but appears fatigued CVS:no rub Resp:cta [id number] Ext:+edema   Recent Labs Lab 08/18/14 1508 08/19/14 1436 08/20/14 0527 08/21/14 0500 08/22/14 0528 08/23/14 0525 08/24/14 0600  NA 136 135 134* 134* 135 134* 135  K 3.6 3.3* 3.5 3.9 3.6 3.6 4.5  CL 100* 99* 99* 100* 101 100* 101  CO2 22 25 25 22 24 23 24   GLUCOSE 253* 141* 181* 233* 119* 84 114*  BUN 35* 35* 35* 37* 39* 40* 45*  CREATININE 5.11* 5.29* 5.71* 5.91* 6.04* 6.17* 6.63*  ALBUMIN 1.6* 1.6* 1.5* 1.4* 1.4* 1.3* 1.4*  CALCIUM 8.2* 8.0* 7.9* 7.8* 7.8* 8.0* 7.9*  PHOS 3.7 3.9 4.2 4.5 4.2 4.3 4.8*   Liver Function Tests:  Recent Labs Lab 08/22/14 0528 08/23/14 0525 08/24/14 0600  ALBUMIN 1.4* 1.3* 1.4*   No results for input(s): LIPASE, AMYLASE in the last 168 hours. No results for input(s): AMMONIA in the last 168 hours. CBC:  Recent Labs Lab 08/20/14 0527 08/21/14 0500 08/22/14 0528 08/23/14 0525 08/24/14 0600  WBC 17.3* 15.6* 15.4* 14.1* 14.6*  HGB 8.4* 7.5* 7.2* 6.8* 7.8*  HCT 25.8* 22.7* 21.6* 20.8* 24.3*  MCV 86.3 88.0 87.1 87.0 86.8  PLT 416* 389 375 401* 425*   Cardiac Enzymes:  Recent Labs Lab 08/22/14 1550  CKTOTAL 95   CBG:  Recent Labs Lab 08/23/14 1652 08/23/14 1718 08/23/14 1807 08/23/14 2143 08/24/14 0804  GLUCAP 67 69 99 101* 104*    Iron  Studies: No results for input(s): IRON, TIBC, TRANSFERRIN, FERRITIN in the last 72 hours. Studies/Results: No results found. Marland Kitchen amLODipine  10 mg Oral Daily  .  ceFAZolin (ANCEF) IV  2 g Intravenous Q24H  . darbepoetin (ARANESP) injection - NON-DIALYSIS  100 mcg Subcutaneous Q Sun-1800  . furosemide  60 mg Intravenous Q12H  . heparin  5,000 Units Subcutaneous 3 times per day  . insulin aspart  0-5 Units Subcutaneous QHS  . insulin aspart  0-9 Units Subcutaneous TID WC  . insulin glargine  12 Units Subcutaneous QHS  . levothyroxine  100 mcg Oral QAC breakfast  . metoprolol tartrate  50 mg Oral BID  . multivitamin with minerals  1 tablet Oral Daily  . OxyCODONE  20 mg Oral Q12H  . senna-docusate  2 tablet Oral QHS  . sodium chloride  1,000 mL Intravenous Once  . sodium chloride  3 mL Intravenous Q12H    BMET    Component Value Date/Time   NA 135 08/24/2014 0600   NA 133* 01/18/2013 1106   K 4.5 08/24/2014 0600   CL 101 08/24/2014 0600   CO2 24 08/24/2014 0600   GLUCOSE 114* 08/24/2014 0600   GLUCOSE 929* 01/18/2013 1106   BUN  45* 08/24/2014 0600   BUN 37* 01/18/2013 1106   CREATININE 6.63* 08/24/2014 0600   CALCIUM 7.9* 08/24/2014 0600   GFRNONAA 8* 08/24/2014 0600   GFRAA 9* 08/24/2014 0600   CBC    Component Value Date/Time   WBC 14.6* 08/24/2014 0600   WBC 20.0* 01/18/2013 1106   RBC 2.80* 08/24/2014 0600   RBC 2.92* 08/10/2014 1220   RBC 3.70* 01/18/2013 1106   HGB 7.8* 08/24/2014 0600   HCT 24.3* 08/24/2014 0600   PLT 425* 08/24/2014 0600   MCV 86.8 08/24/2014 0600   MCH 27.9 08/24/2014 0600   MCH 29.5 01/18/2013 1106   MCHC 32.1 08/24/2014 0600   MCHC 31.9 01/18/2013 1106   RDW 16.0* 08/24/2014 0600   RDW 12.7 01/18/2013 1106   LYMPHSABS 0.9 08/13/2014 0540   LYMPHSABS 1.4 01/18/2013 1106   MONOABS 1.4* 08/13/2014 0540   EOSABS 0.1 08/13/2014 0540   EOSABS 0.0 01/18/2013 1106   BASOSABS 0.1 08/13/2014 0540   BASOSABS 0.0 01/18/2013 1106      Assessment/Plan:  1. AKI/CKD- has advanced CKD at baseline due to poorly controlled DM and HTN further complicated by NSAIDs as well as likely ischemic ATN. Increased UOP. Underwent HD on 7/5 and 08/17/14 and since her last treatment her scr continues to slowly rise.  1. Starting to see a significant increase in UOP so will hold off on HD for another 24-48 hours and continue to follow closely 2. No indication for dialysis at this time, however she is not stable for discharge due to possible need in the near future 3. If Scr continues to climb and/or she develops uremic symptoms will plan another HD session. 4. Discussed treatment options including dialysis as well as kidney pancrease transplant (was under our care until January 2015 when she lost her insurance/job and has not come back since. Has been referred to [facility] by her endocrinologist however if she requires longterm RRT, she will need to re-establish with our group after discharge). 2. Pyomyositis- s/p I&D of right arm as well as right thigh/leg with indwelling drains. On Ancef for MSSA 3. CKD stage 4 due to diabetic nephropathy- pt with h/o poorly controlled type 1 DM now with progressive CKD (Scr almost doubled since December 2015 with baseline of 3 and no labs until June 2016) 1. Discussed need for better DM control 2. Will need referral for kidney pancreas once she stabilizes 3. Unclear if she will require ongoing HD (if she doesn't start to improve) 4. Hyponatremia- due to #1, improved with HD 5. DM- poorly controlled, management per primary svc 6. Anemia- likely due to CKD s/p Aranesp 08/21/14.  7. Vascular access- will likely need tunneled HD cath if she does not have return of renal function. Will need to hold off on AVF until pyomyositis has resolved.  [name]

## 2014-08-24 NOTE — Progress Notes (Signed)
PT Cancellation Note  Patient Details Name: Tracy [name] MRN: [id number] DOB: 1985/02/14   Cancelled Treatment:    Reason Eval/Treat Not Completed: Other (comment). Per patients mother, patient is on phone working out issue with new diabetes pump. Family stated that they will walk with her later this evening. Will follow up tomorrow   [name] 08/24/2014, 2:48 PM

## 2014-08-24 NOTE — Progress Notes (Signed)
PROGRESS NOTE  Tracy [name] [id number] DOB: [birth year] DOA: 08/09/2014 PCP: [name]  HPI/Recap of past 24 hours: This is a 29 year old Caucasian female with history of diabetes mellitus on insulin pump but poor control, hypothyroidism poor compliance she stopped taking her Synthroid 2 months ago for no known reason, chronic kidney disease stage IV baseline creatinine close to 3, who was admitted to the hospital 11 days ago for 2 week history of right arm and right leg pain, upon further workup with MRI was found that she had extensive myositis in both right-sided extremities. She was initially seen at [facility] in [place] and then transferred to [facility].  She was seen here by hand surgery, general surgery, orthopedic surgery, renal, infectious disease, phone consultation was made by me with rheumatologist Dr. Amil Amen. Initially no clear reason of myositis could be found, however since her clinical course did not show any improvement she was taken to the OR first by Dr. Amedeo Plenty about 5 days ago and right arm fluid was drained which was suggestive of infectious myositis, she has been on IV antibiotics throughout her hospital stay. On 08/20/2014 she returned to the OR again by orthopedic surgery for drainage of right leg fluid collection. Her workup certainly suggests infectious myositis however reason for her bacteremia is not clear at all. ID is on board as well.  Note due to her illness patient's has progressed from chronic kidney disease stage IV to close to ESRD, currently under dialysis for the last 7 days. Renal to decide whether this is ESRD or not.  In addition, nursing noted on 7/10 morning that patient is hypoxic off of oxygen and currently requiring 3 L to say above 90% patient has a mild nonproductive cough and some low-grade temperatures. Chest x-ray done only noted mild pleural effusions but no infiltrate.  On 7/12, hemoglobin dropped below 7 so receiving  blood transfusion. Patient responding to extended release OxyContin with overall pain she says better tolerating. Breathing a little bit easier. Her main complaint is of a mouth ulcer that developed a few days ago.  Assessment/Plan: Principal Problem: Mouth ulcer: When necessary Magic mouthwash with lidocaine    Myositis: Possibly from post physical string 2 weeks prior from throwing horse feed along with muscle injury with secondary infection. Status post right arm muscle biopsy along with fluid drainage and JP drain as well as right leg ET guided drainage. Source of bacteremia unclear. Echocardiogram normal.  White blood cell count stable with no further fevers  Active Problems:   Proliferative diabetic retinopathy   Hypothyroidism: We started on Synthroid after noncompliance.    Normocytic anemia: Iron deficiency and also in part from renal failure and chronic disease. Status post 2 units packed red blood cells transfusion on 7/6 and 7/12    Acute renal failure superimposed on stage 4 chronic kidney disease: Previous creatinine baseline at 3. Possibly from hypovolemia from nausea and vomiting plus NSAID use prior to admission causing ATN. Voiding effort toxins. Hydrating and renal ultrasound noting cyst but no obstruction. Urine output stable. Followed by nephrology. Interventional radiology placed ounces catheter on 7/4 and patient underwent dialysis on 7/5. Currently monitoring, but no plans for further dialysis yet. Noted creatinine slightly trending up, although this may be from Lasix. Urine output is good.    DM I (diabetes mellitus, type I), uncontrolled with renal disease: Poor outpatient control. A1c of 8.9. Currently on Lantus plus sliding scale.  CBGs slightly low and patient had episode  of hypoglycemia, so decreased Lantus slightly, discontinued scheduled NovoLog and decreased sliding scale to sensitive. CBGs well-controlled.    Essential hypertension   Acute respiratory failure  with hypoxia: Unclear etiology. Continue oxygen. Suspect volume overload. Patient is +16 L of fluid. Increased Lasix with only some response. We'll try IV albumen  Have encouraged inspirometer use, currently she is up to 1250 mL  Pain control: Had extensive discussion about short and long-acting pain medications. Have started extended release OxyContinand this seems to have made a difference.    Code Status: Full code  Family Communication: Mother at the bedside  Disposition Plan: Continue inpatient until infection felt to be treated, decision made about further dialysis and hypoxia resolved/diuresed fully  Consultants:  Case discussed with rheumatology by phone.  Interventional radiology.  Infectious disease.  Nephrology  General surgery  Hand surgery  Orthopedic surgery  Procedures:  Right arm muscle biopsy plus fluid drainage and JP drain placement of right arm done 7/3  Right IJ dialysis catheter by interventional radiology 7/4  Dialysis session starting 7/5  Right leg ultrasound guided aspiration by interventional radiology 7/6  Leg expiration with drainage of fluid done 7/9  Transfusion 2 units packed red blood cells on  7/6 And 7/12  Antibiotics:  IV vancomycin 6/28-7/5  IV Azactam 6/28-7/4  IV clindamycin 7/4-7/5  IV Ancef 7/6-present   Objective: BP 116/65 mmHg  Pulse 77  Temp(Src) 98.6 F (37 C) (Oral)  Resp 18  Ht 5\' 3"  (1.6 m)  Wt 87.1 kg (192 lb 0.3 oz)  BMI 34.02 kg/m2  SpO2 94%  LMP 07/18/2014  Intake/Output Summary (Last 24 hours) at 08/24/14 1252 Last data filed at 08/24/14 1109  Gross per 24 hour  Intake    660 ml  Output   1950 ml  Net  -1290 ml   Filed Weights   08/20/14 0513 08/21/14 0920 08/22/14 2100  Weight: 87.5 kg (192 lb 14.4 oz) 87.5 kg (192 lb 14.4 oz) 87.1 kg (192 lb 0.3 oz)    Exam:   General:  Alert and oriented 3, no acute distress  Cardiovascular: Regular rate and rhythm, S1-S2  Respiratory: Better  airway exchange, few rales   Abdomen: Soft, nontender, nondistended, positive bowel sounds  Musculoskeletal: No clubbing or cyanosis, trace edema. Left lower extremity wrapped   Data Reviewed: Basic Metabolic Panel:  Recent Labs Lab 08/20/14 0527 08/21/14 0500 08/22/14 0528 08/23/14 0525 08/24/14 0600  NA 134* 134* 135 134* 135  K 3.5 3.9 3.6 3.6 4.5  CL 99* 100* 101 100* 101  CO2 25 22 24 23 24   GLUCOSE 181* 233* 119* 84 114*  BUN 35* 37* 39* 40* 45*  CREATININE 5.71* 5.91* 6.04* 6.17* 6.63*  CALCIUM 7.9* 7.8* 7.8* 8.0* 7.9*  PHOS 4.2 4.5 4.2 4.3 4.8*   Liver Function Tests:  Recent Labs Lab 08/20/14 0527 08/21/14 0500 08/22/14 0528 08/23/14 0525 08/24/14 0600  ALBUMIN 1.5* 1.4* 1.4* 1.3* 1.4*   No results for input(s): LIPASE, AMYLASE in the last 168 hours. No results for input(s): AMMONIA in the last 168 hours. CBC:  Recent Labs Lab 08/20/14 0527 08/21/14 0500 08/22/14 0528 08/23/14 0525 08/24/14 0600  WBC 17.3* 15.6* 15.4* 14.1* 14.6*  HGB 8.4* 7.5* 7.2* 6.8* 7.8*  HCT 25.8* 22.7* 21.6* 20.8* 24.3*  MCV 86.3 88.0 87.1 87.0 86.8  PLT 416* 389 375 401* 425*   Cardiac Enzymes:    Recent Labs Lab 08/22/14 1550  CKTOTAL 95   BNP (last 3 results)  No results for input(s): BNP in the last 8760 hours.  ProBNP (last 3 results) No results for input(s): PROBNP in the last 8760 hours.  CBG:  Recent Labs Lab 08/23/14 1718 08/23/14 1807 08/23/14 2143 08/24/14 0804 08/24/14 1200  GLUCAP 69 99 101* 104* 129*    Recent Results (from the past 240 hour(s))  Anaerobic culture     Status: None   Collection Time: 08/14/14  2:32 PM  Result Value Ref Range Status   Specimen Description FLUID  Final   Special Requests RIGHT ARM FLUID A  Final   Gram Stain   Final    MODERATE WBC PRESENT,BOTH PMN AND MONONUCLEAR NO SQUAMOUS EPITHELIAL CELLS SEEN RARE GRAM POSITIVE COCCI IN PAIRS IN CLUSTERS Performed at [facility]    Culture   Final     NO ANAEROBES ISOLATED Performed at [facility]    Report Status 08/19/2014 FINAL  Final  Culture, routine-abscess     Status: None   Collection Time: 08/14/14  2:32 PM  Result Value Ref Range Status   Specimen Description ARM RIGHT  Final   Special Requests NONE  Final   Gram Stain   Final    FEW WBC PRESENT, PREDOMINANTLY PMN NO SQUAMOUS EPITHELIAL CELLS SEEN RARE GRAM POSITIVE COCCI IN CLUSTERS Performed at [facility]    Culture   Final    FEW STAPHYLOCOCCUS AUREUS Note: RIFAMPIN AND GENTAMICIN SHOULD NOT BE USED AS SINGLE DRUGS FOR TREATMENT OF STAPH INFECTIONS. Performed at [facility]    Report Status 08/17/2014 FINAL  Final   Organism ID, Bacteria STAPHYLOCOCCUS AUREUS  Final      Susceptibility   Staphylococcus aureus - MIC*    CLINDAMYCIN <=0.25 SENSITIVE Sensitive     ERYTHROMYCIN <=0.25 SENSITIVE Sensitive     GENTAMICIN <=0.5 SENSITIVE Sensitive     LEVOFLOXACIN 0.25 SENSITIVE Sensitive     OXACILLIN <=0.25 SENSITIVE Sensitive     PENICILLIN 0.06 SENSITIVE Sensitive     RIFAMPIN <=0.5 SENSITIVE Sensitive     TRIMETH/SULFA <=10 SENSITIVE Sensitive     VANCOMYCIN 1 SENSITIVE Sensitive     TETRACYCLINE <=1 SENSITIVE Sensitive     MOXIFLOXACIN <=0.25 SENSITIVE Sensitive     * FEW STAPHYLOCOCCUS AUREUS  Anaerobic culture     Status: None   Collection Time: 08/14/14  2:36 PM  Result Value Ref Range Status   Specimen Description TISSUE  Final   Special Requests RIGHT ARM B  Final   Gram Stain   Final    NO WBC SEEN NO SQUAMOUS EPITHELIAL CELLS SEEN RARE GRAM POSITIVE COCCI IN PAIRS IN CLUSTERS Performed at [facility]    Culture   Final    NO ANAEROBES ISOLATED Performed at [facility]    Report Status 08/19/2014 FINAL  Final  Tissue culture     Status: None   Collection Time: 08/14/14  2:36 PM  Result Value Ref Range Status   Specimen Description TISSUE  Final   Special Requests RIGHT ARM B  Final   Gram  Stain   Final    RARE WBC PRESENT, PREDOMINANTLY PMN NO SQUAMOUS EPITHELIAL CELLS SEEN RARE GRAM POSITIVE COCCI IN PAIRS Performed at [facility]    Culture   Final    MODERATE STAPHYLOCOCCUS AUREUS Note: RIFAMPIN AND GENTAMICIN SHOULD NOT BE USED AS SINGLE DRUGS FOR TREATMENT OF STAPH INFECTIONS. Performed at [facility]    Report Status 08/17/2014 FINAL  Final  Organism ID, Bacteria STAPHYLOCOCCUS AUREUS  Final      Susceptibility   Staphylococcus aureus - MIC*    CLINDAMYCIN <=0.25 SENSITIVE Sensitive     ERYTHROMYCIN <=0.25 SENSITIVE Sensitive     GENTAMICIN <=0.5 SENSITIVE Sensitive     LEVOFLOXACIN 0.25 SENSITIVE Sensitive     OXACILLIN <=0.25 SENSITIVE Sensitive     PENICILLIN 0.06 SENSITIVE Sensitive     RIFAMPIN <=0.5 SENSITIVE Sensitive     TRIMETH/SULFA <=10 SENSITIVE Sensitive     VANCOMYCIN 1 SENSITIVE Sensitive     TETRACYCLINE <=1 SENSITIVE Sensitive     MOXIFLOXACIN <=0.25 SENSITIVE Sensitive     * MODERATE STAPHYLOCOCCUS AUREUS  Culture, routine-abscess     Status: None   Collection Time: 08/17/14  3:38 PM  Result Value Ref Range Status   Specimen Description ABSCESS RIGHT THIGH  Final   Special Requests Normal  Final   Gram Stain   Final    FEW WBC PRESENT, PREDOMINANTLY MONONUCLEAR NO SQUAMOUS EPITHELIAL CELLS SEEN MODERATE GRAM POSITIVE COCCI IN PAIRS IN CLUSTERS Performed at [facility]    Culture   Final    ABUNDANT STAPHYLOCOCCUS AUREUS Note: RIFAMPIN AND GENTAMICIN SHOULD NOT BE USED AS SINGLE DRUGS FOR TREATMENT OF STAPH INFECTIONS. Performed at [facility]    Report Status 08/21/2014 FINAL  Final   Organism ID, Bacteria STAPHYLOCOCCUS AUREUS  Final      Susceptibility   Staphylococcus aureus - MIC*    CLINDAMYCIN <=0.25 SENSITIVE Sensitive     ERYTHROMYCIN <=0.25 SENSITIVE Sensitive     GENTAMICIN <=0.5 SENSITIVE Sensitive     LEVOFLOXACIN 0.25 SENSITIVE Sensitive     OXACILLIN <=0.25 SENSITIVE  Sensitive     PENICILLIN 0.06 SENSITIVE Sensitive     RIFAMPIN <=0.5 SENSITIVE Sensitive     TRIMETH/SULFA <=10 SENSITIVE Sensitive     VANCOMYCIN <=0.5 SENSITIVE Sensitive     TETRACYCLINE <=1 SENSITIVE Sensitive     MOXIFLOXACIN <=0.25 SENSITIVE Sensitive     * ABUNDANT STAPHYLOCOCCUS AUREUS  Anaerobic culture     Status: None   Collection Time: 08/17/14  3:38 PM  Result Value Ref Range Status   Specimen Description ABSCESS RIGHT THIGH  Final   Special Requests Normal  Final   Gram Stain   Final    FEW WBC PRESENT, PREDOMINANTLY MONONUCLEAR NO SQUAMOUS EPITHELIAL CELLS SEEN MODERATE GRAM POSITIVE COCCI IN PAIRS IN CLUSTERS Performed at [facility]    Culture   Final    NO ANAEROBES ISOLATED Performed at [facility]    Report Status 08/23/2014 FINAL  Final  Anaerobic culture     Status: None (Preliminary result)   Collection Time: 08/20/14 10:40 AM  Result Value Ref Range Status   Specimen Description TISSUE  Final   Special Requests DEEP THIGH MUSCLE POF ANCEF CUP A  Final   Gram Stain   Final    ABUNDANT WBC PRESENT,BOTH PMN AND MONONUCLEAR NO SQUAMOUS EPITHELIAL CELLS SEEN RARE GRAM POSITIVE COCCI IN CLUSTERS Performed at [facility]    Culture   Final    NO ANAEROBES ISOLATED; CULTURE IN PROGRESS FOR 5 DAYS Performed at [facility]    Report Status PENDING  Incomplete  Tissue culture     Status: None   Collection Time: 08/20/14 10:40 AM  Result Value Ref Range Status   Specimen Description TISSUE THIGH  Final   Special Requests POF ON ANCEF  Final   Gram Stain   Final  MODERATE WBC PRESENT,BOTH PMN AND MONONUCLEAR NO SQUAMOUS EPITHELIAL CELLS SEEN FEW GRAM POSITIVE COCCI IN CLUSTERS Performed at [facility]    Culture   Final    FEW STAPHYLOCOCCUS AUREUS Note: RIFAMPIN AND GENTAMICIN SHOULD NOT BE USED AS SINGLE DRUGS FOR TREATMENT OF STAPH INFECTIONS. Performed at [facility]    Report Status  08/23/2014 FINAL  Final   Organism ID, Bacteria STAPHYLOCOCCUS AUREUS  Final      Susceptibility   Staphylococcus aureus - MIC*    CLINDAMYCIN <=0.25 SENSITIVE Sensitive     ERYTHROMYCIN <=0.25 SENSITIVE Sensitive     GENTAMICIN <=0.5 SENSITIVE Sensitive     LEVOFLOXACIN 0.25 SENSITIVE Sensitive     OXACILLIN <=0.25 SENSITIVE Sensitive     PENICILLIN 0.12 SENSITIVE Sensitive     RIFAMPIN <=0.5 SENSITIVE Sensitive     TRIMETH/SULFA <=10 SENSITIVE Sensitive     VANCOMYCIN 1 SENSITIVE Sensitive     TETRACYCLINE <=1 SENSITIVE Sensitive     MOXIFLOXACIN <=0.25 SENSITIVE Sensitive     * FEW STAPHYLOCOCCUS AUREUS     Studies: No results found.  Scheduled Meds: . amLODipine  10 mg Oral Daily  .  ceFAZolin (ANCEF) IV  2 g Intravenous Q24H  . darbepoetin (ARANESP) injection - NON-DIALYSIS  100 mcg Subcutaneous Q Sun-1800  . furosemide  60 mg Intravenous Q12H  . heparin  5,000 Units Subcutaneous 3 times per day  . insulin aspart  0-5 Units Subcutaneous QHS  . insulin aspart  0-9 Units Subcutaneous TID WC  . insulin glargine  12 Units Subcutaneous QHS  . levothyroxine  100 mcg Oral QAC breakfast  . megestrol  40 mg Oral Daily  . metoprolol tartrate  50 mg Oral BID  . multivitamin with minerals  1 tablet Oral Daily  . OxyCODONE  20 mg Oral Q12H  . senna-docusate  2 tablet Oral QHS  . sodium chloride  1,000 mL Intravenous Once  . sodium chloride  3 mL Intravenous Q12H    Continuous Infusions: . sodium chloride       Time spent: 25 minutes  [facility] Pager [phone]. If 7PM-7AM, please contact night-coverage at [email or web address], password [facility] 08/24/2014, 12:52 PM  LOS: 15 days

## 2014-08-24 NOTE — Progress Notes (Signed)
Initial Nutrition Assessment  DOCUMENTATION CODES:   Not applicable  INTERVENTION:   Provide Nepro Shake po BID, each supplement provides 425 kcal and 19 grams protein  Encourage adequate PO intake.  NUTRITION DIAGNOSIS:   Inadequate oral intake related to mouth pain, poor appetite as evidenced by meal completion < 50%.  GOAL:   Patient will meet greater than or equal to 90% of their needs  MONITOR:   PO intake, Supplement acceptance, Weight trends, Labs, I & O's  REASON FOR ASSESSMENT:   Consult Assessment of nutrition requirement/status  ASSESSMENT:   29 y.o. female with PMH of hypertension, diabetes type 1, hypothyroidism, anxiety, anemia, CKD-IV, who presents with right arm and upper leg pain. further workup with MRI was found that she had extensive myositis in both right-sided extremities. Pt underwent I & D of R hand and R leg/thigh. Wound vac removed (7/12). CKD stage 4 progressing close to ESRD. Pt for diatek catheter placement today.   Pt reports having a decreased appetite which has been ongoing over the past 1 month due to pain. Pt reports prior to coming in she was having difficulties keeping her food down. Usual body weight reported to be ~150 lbs. Noted since admission net I/O: + 14 L. Current meal completion has been varied from 0-50%. Pt reports having mouth sores which has been causing pains when eating, however pt does report her sores have been improving today. Pt was offered a nutritional supplement to aid in caloric and protein needs, however pt reports she would like to try to consume her food today. RD to order Nepro for tomorrow as she reports wanting a chance to eat today. RD to continue to monitor.  NFPE completed. No observed signs of significant fat or muscle mass loss.   Labs: Low calcium and GFR. High BUN, creatinine, and phosphorous (4.8).  Diet Order:  Diet Carb Modified Fluid consistency:: Thin; Room service appropriate?: Yes  Skin:    (Incision on R arm and leg, +1 generalized edema)  Last BM:  7/9  Height:   Ht Readings from Last 1 Encounters:  08/10/14 5\' 3"  (1.6 m)    Weight:   Wt Readings from Last 1 Encounters:  08/22/14 192 lb 0.3 oz (87.1 kg)    Ideal Body Weight:  52.27 kg  Wt Readings from Last 10 Encounters:  08/22/14 192 lb 0.3 oz (87.1 kg)  08/08/14 151 lb (68.493 kg)  08/05/14 151 lb (68.493 kg)  07/05/14 147 lb 8 oz (66.906 kg)  01/20/14 141 lb 1.5 oz (64 kg)  06/25/13 140 lb (63.504 kg)  04/01/13 152 lb 9.6 oz (69.219 kg)  01/20/13 143 lb 15.4 oz (65.3 kg)  01/18/13 145 lb (65.772 kg)  01/06/13 141 lb (63.957 kg)    BMI:  Body mass index is 34.02 kg/(m^2).  Estimated Nutritional Needs:   Kcal:  1850-2100  Protein:  90-105 grams  Fluid:  Per MD  EDUCATION NEEDS:   No education needs identified at this time  Corrin Parker, MS, RD, LDN Pager # [phone] After hours/ weekend pager # [phone]

## 2014-08-24 NOTE — Progress Notes (Signed)
Patient ID: Tracy [name], female   DOB: Dec 30, 1985, 29 y.o.   MRN: [id number]         [facility]    Date of Admission:  08/09/2014   Total days of antibiotics 16 Day 8 of cefazolin  Principal Problem:   Myositis Active Problems:   Proliferative diabetic retinopathy   Hypothyroidism   Normocytic anemia   Acute renal failure superimposed on stage 4 chronic kidney disease   Seizure   Sepsis   DM I (diabetes mellitus, type I), uncontrolled   Essential hypertension   Acute respiratory failure with hypoxia   . albumin human  12.5 g Intravenous Once  . amLODipine  10 mg Oral Daily  .  ceFAZolin (ANCEF) IV  2 g Intravenous Q24H  . darbepoetin (ARANESP) injection - NON-DIALYSIS  100 mcg Subcutaneous Q Sun-1800  . furosemide  60 mg Intravenous Q12H  . heparin  5,000 Units Subcutaneous 3 times per day  . insulin aspart  0-5 Units Subcutaneous QHS  . insulin aspart  0-9 Units Subcutaneous TID WC  . insulin glargine  12 Units Subcutaneous QHS  . levothyroxine  100 mcg Oral QAC breakfast  . megestrol  40 mg Oral Daily  . metoprolol tartrate  50 mg Oral BID  . multivitamin with minerals  1 tablet Oral Daily  . OxyCODONE  20 mg Oral Q12H  . senna-docusate  2 tablet Oral QHS  . sodium chloride  1,000 mL Intravenous Once  . sodium chloride  3 mL Intravenous Q12H    Subjective: Tracy [name] says her pain is better controlled today compared to yesterday. Her wound wac was removed yesterday. She is eager to leave.  Review of Systems: Pertinent items are noted in HPI.  Past Medical History  Diagnosis Date  . Thyroid enlargement     "not on medication at this time"  . Urinary tract infection     hx of  . Anemia     presently on iron supplement  . Diabetes mellitus     insulin pump   . Hypothyroidism   . Anxiety   . HSV-2 (herpes simplex virus 2) infection   .  HSV-1 (herpes simplex virus 1) infection   . Detached retina   . Yeast infection     took diflucan saturday   . Hypertension   . Irregular periods 07/05/2014  . Vaginal discharge 07/05/2014    History  Substance Use Topics  . Smoking status: Never Smoker   . Smokeless tobacco: Never Used  . Alcohol Use: No    Family History  Problem Relation Age of Onset  . Cancer Paternal Grandfather     prostate  . Hyperlipidemia Paternal Grandfather   . Stroke Paternal Grandfather    Allergies  Allergen Reactions  . Penicillins Hives and Swelling  . Sulfa Antibiotics Hives    OBJECTIVE: Blood pressure 116/65, pulse 77, temperature 98.6 F (37 C), temperature source Oral, resp. rate 18, height 5\' 3"  (1.6 m), weight 87.1 kg (192 lb 0.3 oz), last menstrual period 07/18/2014, SpO2 94 %. General: Sitting in recliner watching television. More alert today than yesterday. Skin: Dry, intact. No erythema or signs of infection surrounding bandages of surgical sites.  Lab Results Lab Results  Component Value Date   WBC 14.6* 08/24/2014   HGB 7.8* 08/24/2014   HCT 24.3* 08/24/2014   MCV 86.8 08/24/2014   PLT 425* 08/24/2014    Lab Results  Component Value Date   CREATININE  6.63* 08/24/2014   BUN 45* 08/24/2014   NA 135 08/24/2014   K 4.5 08/24/2014   CL 101 08/24/2014   CO2 24 08/24/2014    Lab Results  Component Value Date   ALT 18 08/10/2014   AST 38 08/10/2014   ALKPHOS 166* 08/10/2014   BILITOT 0.6 08/10/2014     Microbiology: Recent Results (from the past 240 hour(s))  Anaerobic culture     Status: None   Collection Time: 08/14/14  2:32 PM  Result Value Ref Range Status   Specimen Description FLUID  Final   Special Requests RIGHT ARM FLUID A  Final   Gram Stain   Final    MODERATE WBC PRESENT,BOTH PMN AND MONONUCLEAR NO SQUAMOUS EPITHELIAL CELLS SEEN RARE GRAM POSITIVE COCCI IN PAIRS IN CLUSTERS Performed at [facility]    Culture   Final    NO ANAEROBES  ISOLATED Performed at [facility]    Report Status 08/19/2014 FINAL  Final  Culture, routine-abscess     Status: None   Collection Time: 08/14/14  2:32 PM  Result Value Ref Range Status   Specimen Description ARM RIGHT  Final   Special Requests NONE  Final   Gram Stain   Final    FEW WBC PRESENT, PREDOMINANTLY PMN NO SQUAMOUS EPITHELIAL CELLS SEEN RARE GRAM POSITIVE COCCI IN CLUSTERS Performed at [facility]    Culture   Final    FEW STAPHYLOCOCCUS AUREUS Note: RIFAMPIN AND GENTAMICIN SHOULD NOT BE USED AS SINGLE DRUGS FOR TREATMENT OF STAPH INFECTIONS. Performed at [facility]    Report Status 08/17/2014 FINAL  Final   Organism ID, Bacteria STAPHYLOCOCCUS AUREUS  Final      Susceptibility   Staphylococcus aureus - MIC*    CLINDAMYCIN <=0.25 SENSITIVE Sensitive     ERYTHROMYCIN <=0.25 SENSITIVE Sensitive     GENTAMICIN <=0.5 SENSITIVE Sensitive     LEVOFLOXACIN 0.25 SENSITIVE Sensitive     OXACILLIN <=0.25 SENSITIVE Sensitive     PENICILLIN 0.06 SENSITIVE Sensitive     RIFAMPIN <=0.5 SENSITIVE Sensitive     TRIMETH/SULFA <=10 SENSITIVE Sensitive     VANCOMYCIN 1 SENSITIVE Sensitive     TETRACYCLINE <=1 SENSITIVE Sensitive     MOXIFLOXACIN <=0.25 SENSITIVE Sensitive     * FEW STAPHYLOCOCCUS AUREUS  Anaerobic culture     Status: None   Collection Time: 08/14/14  2:36 PM  Result Value Ref Range Status   Specimen Description TISSUE  Final   Special Requests RIGHT ARM B  Final   Gram Stain   Final    NO WBC SEEN NO SQUAMOUS EPITHELIAL CELLS SEEN RARE GRAM POSITIVE COCCI IN PAIRS IN CLUSTERS Performed at [facility]    Culture   Final    NO ANAEROBES ISOLATED Performed at [facility]    Report Status 08/19/2014 FINAL  Final  Tissue culture     Status: None   Collection Time: 08/14/14  2:36 PM  Result Value Ref Range Status   Specimen Description TISSUE  Final   Special Requests RIGHT ARM B  Final   Gram Stain   Final     RARE WBC PRESENT, PREDOMINANTLY PMN NO SQUAMOUS EPITHELIAL CELLS SEEN RARE GRAM POSITIVE COCCI IN PAIRS Performed at [facility]    Culture   Final    MODERATE STAPHYLOCOCCUS AUREUS Note: RIFAMPIN AND GENTAMICIN SHOULD NOT BE USED AS SINGLE DRUGS FOR TREATMENT OF STAPH INFECTIONS. Performed at [facility]  Report Status 08/17/2014 FINAL  Final   Organism ID, Bacteria STAPHYLOCOCCUS AUREUS  Final      Susceptibility   Staphylococcus aureus - MIC*    CLINDAMYCIN <=0.25 SENSITIVE Sensitive     ERYTHROMYCIN <=0.25 SENSITIVE Sensitive     GENTAMICIN <=0.5 SENSITIVE Sensitive     LEVOFLOXACIN 0.25 SENSITIVE Sensitive     OXACILLIN <=0.25 SENSITIVE Sensitive     PENICILLIN 0.06 SENSITIVE Sensitive     RIFAMPIN <=0.5 SENSITIVE Sensitive     TRIMETH/SULFA <=10 SENSITIVE Sensitive     VANCOMYCIN 1 SENSITIVE Sensitive     TETRACYCLINE <=1 SENSITIVE Sensitive     MOXIFLOXACIN <=0.25 SENSITIVE Sensitive     * MODERATE STAPHYLOCOCCUS AUREUS  Culture, routine-abscess     Status: None   Collection Time: 08/17/14  3:38 PM  Result Value Ref Range Status   Specimen Description ABSCESS RIGHT THIGH  Final   Special Requests Normal  Final   Gram Stain   Final    FEW WBC PRESENT, PREDOMINANTLY MONONUCLEAR NO SQUAMOUS EPITHELIAL CELLS SEEN MODERATE GRAM POSITIVE COCCI IN PAIRS IN CLUSTERS Performed at [facility]    Culture   Final    ABUNDANT STAPHYLOCOCCUS AUREUS Note: RIFAMPIN AND GENTAMICIN SHOULD NOT BE USED AS SINGLE DRUGS FOR TREATMENT OF STAPH INFECTIONS. Performed at [facility]    Report Status 08/21/2014 FINAL  Final   Organism ID, Bacteria STAPHYLOCOCCUS AUREUS  Final      Susceptibility   Staphylococcus aureus - MIC*    CLINDAMYCIN <=0.25 SENSITIVE Sensitive     ERYTHROMYCIN <=0.25 SENSITIVE Sensitive     GENTAMICIN <=0.5 SENSITIVE Sensitive     LEVOFLOXACIN 0.25 SENSITIVE Sensitive     OXACILLIN <=0.25 SENSITIVE Sensitive      PENICILLIN 0.06 SENSITIVE Sensitive     RIFAMPIN <=0.5 SENSITIVE Sensitive     TRIMETH/SULFA <=10 SENSITIVE Sensitive     VANCOMYCIN <=0.5 SENSITIVE Sensitive     TETRACYCLINE <=1 SENSITIVE Sensitive     MOXIFLOXACIN <=0.25 SENSITIVE Sensitive     * ABUNDANT STAPHYLOCOCCUS AUREUS  Anaerobic culture     Status: None   Collection Time: 08/17/14  3:38 PM  Result Value Ref Range Status   Specimen Description ABSCESS RIGHT THIGH  Final   Special Requests Normal  Final   Gram Stain   Final    FEW WBC PRESENT, PREDOMINANTLY MONONUCLEAR NO SQUAMOUS EPITHELIAL CELLS SEEN MODERATE GRAM POSITIVE COCCI IN PAIRS IN CLUSTERS Performed at [facility]    Culture   Final    NO ANAEROBES ISOLATED Performed at [facility]    Report Status 08/23/2014 FINAL  Final  Anaerobic culture     Status: None (Preliminary result)   Collection Time: 08/20/14 10:40 AM  Result Value Ref Range Status   Specimen Description TISSUE  Final   Special Requests DEEP THIGH MUSCLE POF ANCEF CUP A  Final   Gram Stain   Final    ABUNDANT WBC PRESENT,BOTH PMN AND MONONUCLEAR NO SQUAMOUS EPITHELIAL CELLS SEEN RARE GRAM POSITIVE COCCI IN CLUSTERS Performed at [facility]    Culture   Final    NO ANAEROBES ISOLATED; CULTURE IN PROGRESS FOR 5 DAYS Performed at [facility]    Report Status PENDING  Incomplete  Tissue culture     Status: None   Collection Time: 08/20/14 10:40 AM  Result Value Ref Range Status   Specimen Description TISSUE THIGH  Final   Special Requests POF ON ANCEF  Final  Gram Stain   Final    MODERATE WBC PRESENT,BOTH PMN AND MONONUCLEAR NO SQUAMOUS EPITHELIAL CELLS SEEN FEW GRAM POSITIVE COCCI IN CLUSTERS Performed at [facility]    Culture   Final    FEW STAPHYLOCOCCUS AUREUS Note: RIFAMPIN AND GENTAMICIN SHOULD NOT BE USED AS SINGLE DRUGS FOR TREATMENT OF STAPH INFECTIONS. Performed at [facility]    Report Status 08/23/2014 FINAL   Final   Organism ID, Bacteria STAPHYLOCOCCUS AUREUS  Final      Susceptibility   Staphylococcus aureus - MIC*    CLINDAMYCIN <=0.25 SENSITIVE Sensitive     ERYTHROMYCIN <=0.25 SENSITIVE Sensitive     GENTAMICIN <=0.5 SENSITIVE Sensitive     LEVOFLOXACIN 0.25 SENSITIVE Sensitive     OXACILLIN <=0.25 SENSITIVE Sensitive     PENICILLIN 0.12 SENSITIVE Sensitive     RIFAMPIN <=0.5 SENSITIVE Sensitive     TRIMETH/SULFA <=10 SENSITIVE Sensitive     VANCOMYCIN 1 SENSITIVE Sensitive     TETRACYCLINE <=1 SENSITIVE Sensitive     MOXIFLOXACIN <=0.25 SENSITIVE Sensitive     * FEW STAPHYLOCOCCUS AUREUS    Assessment: Tracy [name]'s surgical cultures of her right inner thigh grew MSSA, consistent with the myositis in her right arm. Her surgical wounds appear to be healing well. We will continue cefazolin therapy.  Plan: 1. Continue IV cefazolin  Loleta Chance, MD [facility] 08/24/2014, 11:43 AM   Addendum: I have seen and examined Tracy [name] today and discussed her care with Dr. Melburn Hake. She continues to improve slowly. I will continue cefazolin while here and consider converting to oral cephalexin upon discharge.  Michel Bickers, MD [facility] [place] [facility] [phone] pager   [phone] cell 08/24/2014, 3:48 PM

## 2014-08-24 NOTE — Progress Notes (Signed)
Paged Dr. [place] Pink, pt requests to speak with him about FMLA, fax and email.

## 2014-08-24 NOTE — Progress Notes (Signed)
Pt gone down via bed for diatek catheter placement.

## 2014-08-25 ENCOUNTER — Inpatient Hospital Stay (HOSPITAL_COMMUNITY): Payer: BLUE CROSS/BLUE SHIELD

## 2014-08-25 LAB — CBC
HEMATOCRIT: 23.9 % — AB (ref 36.0–46.0)
HEMATOCRIT: 25.5 % — AB (ref 36.0–46.0)
HEMOGLOBIN: 8.3 g/dL — AB (ref 12.0–15.0)
Hemoglobin: 7.7 g/dL — ABNORMAL LOW (ref 12.0–15.0)
MCH: 28.3 pg (ref 26.0–34.0)
MCH: 28.6 pg (ref 26.0–34.0)
MCHC: 32.2 g/dL (ref 30.0–36.0)
MCHC: 32.5 g/dL (ref 30.0–36.0)
MCV: 87.9 fL (ref 78.0–100.0)
MCV: 87.9 fL (ref 78.0–100.0)
Platelets: 427 10*3/uL — ABNORMAL HIGH (ref 150–400)
Platelets: 485 10*3/uL — ABNORMAL HIGH (ref 150–400)
RBC: 2.72 MIL/uL — ABNORMAL LOW (ref 3.87–5.11)
RBC: 2.9 MIL/uL — AB (ref 3.87–5.11)
RDW: 16.2 % — ABNORMAL HIGH (ref 11.5–15.5)
RDW: 15.8 % — ABNORMAL HIGH (ref 11.5–15.5)
WBC: 12.1 10*3/uL — ABNORMAL HIGH (ref 4.0–10.5)
WBC: 13.2 10*3/uL — ABNORMAL HIGH (ref 4.0–10.5)

## 2014-08-25 LAB — ANAEROBIC CULTURE

## 2014-08-25 LAB — RENAL FUNCTION PANEL
ALBUMIN: 1.5 g/dL — AB (ref 3.5–5.0)
ANION GAP: 12 (ref 5–15)
BUN: 51 mg/dL — ABNORMAL HIGH (ref 6–20)
CALCIUM: 8.1 mg/dL — AB (ref 8.9–10.3)
CHLORIDE: 99 mmol/L — AB (ref 101–111)
CO2: 24 mmol/L (ref 22–32)
Creatinine, Ser: 7.12 mg/dL — ABNORMAL HIGH (ref 0.44–1.00)
GFR calc Af Amer: 8 mL/min — ABNORMAL LOW (ref >60)
GFR calc non Af Amer: 7 mL/min — ABNORMAL LOW (ref >60)
Glucose, Bld: 218 mg/dL — ABNORMAL HIGH (ref 65–99)
Phosphorus: 4.8 mg/dL — ABNORMAL HIGH (ref 2.5–4.6)
Potassium: 3.7 mmol/L (ref 3.5–5.1)
SODIUM: 135 mmol/L (ref 135–145)

## 2014-08-25 LAB — GLUCOSE, CAPILLARY
GLUCOSE-CAPILLARY: 139 mg/dL — AB (ref 65–99)
Glucose-Capillary: 206 mg/dL — ABNORMAL HIGH (ref 65–99)

## 2014-08-25 MED ORDER — OXYCODONE HCL 5 MG PO TABS
5.0000 mg | ORAL_TABLET | ORAL | Status: DC | PRN
Start: 2014-08-25 — End: 2014-09-02
  Administered 2014-09-01: 5 mg via ORAL
  Filled 2014-08-25: qty 1

## 2014-08-25 MED ORDER — POLYETHYLENE GLYCOL 3350 17 G PO PACK
17.0000 g | PACK | Freq: Every day | ORAL | Status: DC
  Administered 2014-08-26 – 2014-08-28 (×3): 17 g via ORAL
  Filled 2014-08-25 (×8): qty 1

## 2014-08-25 MED ORDER — LIDOCAINE HCL (PF) 1 % IJ SOLN: 5.0000 mL | INTRAMUSCULAR | Status: DC | PRN

## 2014-08-25 MED ORDER — HEPARIN SODIUM (PORCINE) 1000 UNIT/ML DIALYSIS: 20.0000 [IU]/kg | INTRAMUSCULAR | Status: DC | PRN

## 2014-08-25 MED ORDER — MEGESTROL ACETATE 400 MG/10ML PO SUSP
400.0000 mg | Freq: Every day | ORAL | Status: DC
  Administered 2014-08-25 – 2014-09-01 (×5): 400 mg via ORAL
  Filled 2014-08-25 (×9): qty 10

## 2014-08-25 MED ORDER — NEPRO/CARBSTEADY PO LIQD: 237.0000 mL | ORAL | Status: DC | PRN

## 2014-08-25 MED ORDER — SODIUM CHLORIDE 0.9 % IV SOLN: 100.0000 mL | INTRAVENOUS | Status: DC | PRN

## 2014-08-25 MED ORDER — HEPARIN SODIUM (PORCINE) 1000 UNIT/ML DIALYSIS: 1000.0000 [IU] | INTRAMUSCULAR | Status: DC | PRN

## 2014-08-25 MED ORDER — ALTEPLASE 2 MG IJ SOLR: 2.0000 mg | Freq: Once | INTRAMUSCULAR | Status: DC | PRN

## 2014-08-25 MED ORDER — PENTAFLUOROPROP-TETRAFLUOROETH EX AERO: 1.0000 "application " | INHALATION_SPRAY | CUTANEOUS | Status: DC | PRN

## 2014-08-25 MED ORDER — LIDOCAINE-PRILOCAINE 2.5-2.5 % EX CREA: 1.0000 "application " | TOPICAL_CREAM | CUTANEOUS | Status: DC | PRN

## 2014-08-25 NOTE — Progress Notes (Signed)
Pt completed HD with no complications. 2L fluid removed. Report given and pt returned safely to room.

## 2014-08-25 NOTE — Progress Notes (Addendum)
PROGRESS NOTE  Tracy [name] [id number] DOB: 1985/04/30 DOA: 08/09/2014 PCP: [name]  HPI/Recap of past 24 hours: This is a 29 year old Caucasian female with history of diabetes mellitus on insulin pump but poor control, hypothyroidism poor compliance she stopped taking her Synthroid 2 months ago due to insurance issues and medication affordability, chronic kidney disease stage IV baseline creatinine close to 3, who was admitted to the hospital 11 days ago for 2 week history of right arm and right leg pain, upon further workup with MRI was found that she had extensive myositis in both right-sided extremities. She was initially seen at [facility] in [place] and then transferred to [facility] [place].  She was seen here by hand surgery, general surgery, orthopedic surgery, renal, infectious disease, phone consultation with rheumatologist Dr. Amil Amen. Initially no clear reason of myositis could be found, however since her clinical course did not show any improvement she was taken to the OR first by Dr. Amedeo Plenty about 5 days ago and right arm fluid was drained which was suggestive of infectious myositis, she has been on IV antibiotics throughout her hospital stay. On 08/20/2014 she returned to the OR again by orthopedic surgery for drainage of right leg fluid collection. Her workup certainly suggests infectious myositis however reason for her bacteremia is not clear at all. ID is on board as well.  Note due to her illness patient's has progressed from chronic kidney disease stage IV.  She received dialysis on 7/5 and 7/6, but not since then. Renal to decide whether this is ESRD or not.  In addition, nursing noted on 7/10 morning that patient is hypoxic off of oxygen and currently requiring 3 L to say above 90% patient has a mild nonproductive cough and some low-grade temperatures. Chest x-ray done only noted mild pleural effusions but no infiltrate. attempts have been made to aggressively  diurese her and she's only had a moderate response. Her BUN and creatinine have subsequently risen and nephrology made decision to dialyze her again today 7/14.     On 7/12, hemoglobin dropped below 7 so receiving blood transfusion.  patient herself doing okay today. Her pain control is much better on extended release OxyContin that she has not required breakthrough pain medication at all for 2 days .  Has not had a bowel movement in several days   Assessment/Plan: Principal Problem: Mouth ulcer: When necessary Magic mouthwash with lidocaine.  Mouth ulcer improving    Myositis: Possibly from post physical string 2 weeks prior from throwing horse feed along with muscle injury with secondary infection. Status post right arm muscle biopsy along with fluid drainage and JP drain as well as right leg ET guided drainage. Source of bacteremia unclear. Echocardiogram normal.  White blood cell councontinues to trend downward .    Active Problems:   Proliferative diabetic retinopathy   Hypothyroidism: We started on Synthroid after noncompliance.    Normocytic anemia: Iron deficiency and also in part from renal failure and chronic disease. Status post 2 units packed red blood cells transfusion on 7/6 and 7/12    Acute renal failure superimposed on stage 4 chronic kidney disease: Previous creatinine baseline at 3. Possibly from hypovolemia from nausea and vomiting plus NSAID use prior to admission causing ATN. Voiding effort toxins. Hydrating and renal ultrasound noting cyst but no obstruction. Urine output stable. Followed by nephrology. Interventional radiology placed ounces catheter on 7/4 and patient underwent dialysis on 7/5In 7/6 . discussed with nephrology and given trend up  in BUN/creatinine likely because of Lasix, they will go ahead and dialyze today and therefore decrease her Lasix. If her creatinine continues to trend up after this, they will call this end-stage renal disease and plan for more  scheduled formal dialysis with access plans  Acute respiratory failure with hypoxia: Nursing noted that patient required at least 3 L of oxygen. Chest x-ray done. No signs of infiltrate, but did note mild pleural effusions leading [place] to start diuresing her aggressively. She still has had some intermittent cough and for the most part has been nonproductive although last night, she noted some slightly darker sputum. Given no fever and white count continuing to improve on Ancef, will not treat this as healthcare associated pneumonia yet. We'll plan to check repeat chest x-ray postdialysis and if infiltrate noted and/or If her hypoxia does not improve following dialysis, we'll start additional antibiotics for healthcare associated pneumonia    DM I (diabetes mellitus, type I), uncontrolled with renal disease: Poor outpatient control. A1c of 8.9. Currently on Lantus plus sliding scale.  CBGs slightly low and patient had episode of hypoglycemia, so decreased Lantus slightly, discontinued scheduled NovoLog and decreased sliding scale to sensitive. CBGs well-controlled.    Essential hypertension   Acute respiratory failure with hypoxia: Unclear etiology. Continue oxygen. Suspect volume overload. Patient is +16 L of fluid. Increased Lasix with only some response. We'll try IV albumen  Have encouraged inspirometer use, currently she is up to 1250 mL  Pain control: responding well to extended release OxyContin. No longer requiring when necessary medications. Change IVs to by mouth. If she continues this way, decrease oxy extended release from 20 mg to 15 on Friday or Saturday    Code Status: Full code  Family Communication: Mother at the bedside  Disposition Plan: Continue inpatient until infection felt to be treated, decision made about further dialysis and hypoxia resolved/diuresed fully  Consultants:  Case discussed with rheumatology by phone.  Interventional radiology.  Infectious  disease.  Nephrology  General surgery  Hand surgery  Orthopedic surgery  Procedures:  Right arm muscle biopsy plus fluid drainage and JP drain placement of right arm done 7/3  Right IJ dialysis catheter by interventional radiology 7/4  Dialysis session starting 7/5  Right leg ultrasound guided aspiration by interventional radiology 7/6  Leg expiration with drainage of fluid done 7/9  Transfusion 2 units packed red blood cells on  7/6 And 7/12  Antibiotics:  IV vancomycin 6/28-7/5  IV Azactam 6/28-7/4  IV clindamycin 7/4-7/5  IV Ancef 7/6-present   Objective: BP 120/62 mmHg  Pulse 79  Temp(Src) 98.5 F (36.9 C) (Oral)  Resp 18  Ht 5\' 3"  (1.6 m)  Wt 88.6 kg (195 lb 5.2 oz)  BMI 34.61 kg/m2  SpO2 93%  LMP 07/18/2014  Intake/Output Summary (Last 24 hours) at 08/25/14 1429 Last data filed at 08/25/14 1000  Gross per 24 hour  Intake    720 ml  Output   1600 ml  Net   -880 ml   Filed Weights   08/21/14 0920 08/22/14 2100 08/25/14 0412  Weight: 87.5 kg (192 lb 14.4 oz) 87.1 kg (192 lb 0.3 oz) 88.6 kg (195 lb 5.2 oz)    Exam:   General:  Alert and oriented 3, no acute distress  Cardiovascular: Regular rate and rhythm, S1-S2  Respiratory:  more clear to auscultation bilaterally, few rales   Abdomen: Soft, nontender, nondistended, positive bowel sounds  Musculoskeletal: No clubbing or cyanosis, trace edema. Left lower extremity  wrapped   Data Reviewed: Basic Metabolic Panel:  Recent Labs Lab 08/21/14 0500 08/22/14 0528 08/23/14 0525 08/24/14 0600 08/25/14 0521  NA 134* 135 134* 135 135  K 3.9 3.6 3.6 4.5 3.7  CL 100* 101 100* 101 99*  CO2 22 24 23 24 24   GLUCOSE 233* 119* 84 114* 218*  BUN 37* 39* 40* 45* 51*  CREATININE 5.91* 6.04* 6.17* 6.63* 7.12*  CALCIUM 7.8* 7.8* 8.0* 7.9* 8.1*  PHOS 4.5 4.2 4.3 4.8* 4.8*   Liver Function Tests:  Recent Labs Lab 08/21/14 0500 08/22/14 0528 08/23/14 0525 08/24/14 0600 08/25/14 0521   ALBUMIN 1.4* 1.4* 1.3* 1.4* 1.5*   No results for input(s): LIPASE, AMYLASE in the last 168 hours. No results for input(s): AMMONIA in the last 168 hours. CBC:  Recent Labs Lab 08/21/14 0500 08/22/14 0528 08/23/14 0525 08/24/14 0600 08/25/14 0521  WBC 15.6* 15.4* 14.1* 14.6* 12.1*  HGB 7.5* 7.2* 6.8* 7.8* 7.7*  HCT 22.7* 21.6* 20.8* 24.3* 23.9*  MCV 88.0 87.1 87.0 86.8 87.9  PLT 389 375 401* 425* 427*   Cardiac Enzymes:    Recent Labs Lab 08/22/14 1550  CKTOTAL 95   BNP (last 3 results) No results for input(s): BNP in the last 8760 hours.  ProBNP (last 3 results) No results for input(s): PROBNP in the last 8760 hours.  CBG:  Recent Labs Lab 08/24/14 1200 08/24/14 1644 08/24/14 2107 08/25/14 0821 08/25/14 1132  GLUCAP 129* 136* 145* 206* 139*    Recent Results (from the past 240 hour(s))  Culture, routine-abscess     Status: None   Collection Time: 08/17/14  3:38 PM  Result Value Ref Range Status   Specimen Description ABSCESS RIGHT THIGH  Final   Special Requests Normal  Final   Gram Stain   Final    FEW WBC PRESENT, PREDOMINANTLY MONONUCLEAR NO SQUAMOUS EPITHELIAL CELLS SEEN MODERATE GRAM POSITIVE COCCI IN PAIRS IN CLUSTERS Performed at [facility]    Culture   Final    ABUNDANT STAPHYLOCOCCUS AUREUS Note: RIFAMPIN AND GENTAMICIN SHOULD NOT BE USED AS SINGLE DRUGS FOR TREATMENT OF STAPH INFECTIONS. Performed at [facility]    Report Status 08/21/2014 FINAL  Final   Organism ID, Bacteria STAPHYLOCOCCUS AUREUS  Final      Susceptibility   Staphylococcus aureus - MIC*    CLINDAMYCIN <=0.25 SENSITIVE Sensitive     ERYTHROMYCIN <=0.25 SENSITIVE Sensitive     GENTAMICIN <=0.5 SENSITIVE Sensitive     LEVOFLOXACIN 0.25 SENSITIVE Sensitive     OXACILLIN <=0.25 SENSITIVE Sensitive     PENICILLIN 0.06 SENSITIVE Sensitive     RIFAMPIN <=0.5 SENSITIVE Sensitive     TRIMETH/SULFA <=10 SENSITIVE Sensitive     VANCOMYCIN <=0.5 SENSITIVE  Sensitive     TETRACYCLINE <=1 SENSITIVE Sensitive     MOXIFLOXACIN <=0.25 SENSITIVE Sensitive     * ABUNDANT STAPHYLOCOCCUS AUREUS  Anaerobic culture     Status: None   Collection Time: 08/17/14  3:38 PM  Result Value Ref Range Status   Specimen Description ABSCESS RIGHT THIGH  Final   Special Requests Normal  Final   Gram Stain   Final    FEW WBC PRESENT, PREDOMINANTLY MONONUCLEAR NO SQUAMOUS EPITHELIAL CELLS SEEN MODERATE GRAM POSITIVE COCCI IN PAIRS IN CLUSTERS Performed at [facility]    Culture   Final    NO ANAEROBES ISOLATED Performed at [facility]    Report Status 08/23/2014 FINAL  Final  Anaerobic culture  Status: None   Collection Time: 08/20/14 10:40 AM  Result Value Ref Range Status   Specimen Description TISSUE  Final   Special Requests DEEP THIGH MUSCLE POF ANCEF CUP A  Final   Gram Stain   Final    ABUNDANT WBC PRESENT,BOTH PMN AND MONONUCLEAR NO SQUAMOUS EPITHELIAL CELLS SEEN RARE GRAM POSITIVE COCCI IN CLUSTERS Performed at [facility]    Culture   Final    NO ANAEROBES ISOLATED Performed at [facility]    Report Status 08/25/2014 FINAL  Final  Tissue culture     Status: None   Collection Time: 08/20/14 10:40 AM  Result Value Ref Range Status   Specimen Description TISSUE THIGH  Final   Special Requests POF ON ANCEF  Final   Gram Stain   Final    MODERATE WBC PRESENT,BOTH PMN AND MONONUCLEAR NO SQUAMOUS EPITHELIAL CELLS SEEN FEW GRAM POSITIVE COCCI IN CLUSTERS Performed at [facility]    Culture   Final    FEW STAPHYLOCOCCUS AUREUS Note: RIFAMPIN AND GENTAMICIN SHOULD NOT BE USED AS SINGLE DRUGS FOR TREATMENT OF STAPH INFECTIONS. Performed at [facility]    Report Status 08/23/2014 FINAL  Final   Organism ID, Bacteria STAPHYLOCOCCUS AUREUS  Final      Susceptibility   Staphylococcus aureus - MIC*    CLINDAMYCIN <=0.25 SENSITIVE Sensitive     ERYTHROMYCIN <=0.25 SENSITIVE Sensitive      GENTAMICIN <=0.5 SENSITIVE Sensitive     LEVOFLOXACIN 0.25 SENSITIVE Sensitive     OXACILLIN <=0.25 SENSITIVE Sensitive     PENICILLIN 0.12 SENSITIVE Sensitive     RIFAMPIN <=0.5 SENSITIVE Sensitive     TRIMETH/SULFA <=10 SENSITIVE Sensitive     VANCOMYCIN 1 SENSITIVE Sensitive     TETRACYCLINE <=1 SENSITIVE Sensitive     MOXIFLOXACIN <=0.25 SENSITIVE Sensitive     * FEW STAPHYLOCOCCUS AUREUS     Studies: No results found.  Scheduled Meds: . amLODipine  10 mg Oral Daily  .  ceFAZolin (ANCEF) IV  2 g Intravenous Q24H  . darbepoetin (ARANESP) injection - NON-DIALYSIS  100 mcg Subcutaneous Q Sun-1800  . feeding supplement (NEPRO CARB STEADY)  237 mL Oral BID BM  . furosemide  60 mg Intravenous Q12H  . heparin  5,000 Units Subcutaneous 3 times per day  . insulin aspart  0-5 Units Subcutaneous QHS  . insulin aspart  0-9 Units Subcutaneous TID WC  . insulin glargine  12 Units Subcutaneous QHS  . levothyroxine  100 mcg Oral QAC breakfast  . megestrol  40 mg Oral Daily  . metoprolol tartrate  50 mg Oral BID  . multivitamin with minerals  1 tablet Oral Daily  . OxyCODONE  20 mg Oral Q12H  . [START ON 08/26/2014] polyethylene glycol  17 g Oral Daily  . senna-docusate  2 tablet Oral QHS  . sodium chloride  1,000 mL Intravenous Once  . sodium chloride  3 mL Intravenous Q12H    Continuous Infusions: . sodium chloride       Time spent: 25 minutes  [facility] Pager [phone]. If 7PM-7AM, please contact night-coverage at [email or web address], password [email or web address] 08/25/2014, 2:29 PM  LOS: 16 days

## 2014-08-25 NOTE — Progress Notes (Signed)
Patient ID: Tracy [name], female   DOB: 06-30-1985, 30 y.o.   MRN: [id number] S:no new complaints O:BP 117/58 mmHg  Pulse 74  Temp(Src) 98.9 F (37.2 C) (Oral)  Resp 18  Ht 5\' 3"  (1.6 m)  Wt 88.6 kg (195 lb 5.2 oz)  BMI 34.61 kg/m2  SpO2 96%  LMP 07/18/2014  Intake/Output Summary (Last 24 hours) at 08/25/14 1018 Last data filed at 08/25/14 0600  Gross per 24 hour  Intake    720 ml  Output   1850 ml  Net  -1130 ml   Intake/Output: I/O last 3 completed shifts: In: 960 [P.O.:960] Out: 2650 [Urine:2650]  Intake/Output this shift:    Weight change:  Gen:WD WN WF wearing O2 and sitting in chair CVS:no rub Resp:cta [id number] Ext:+edema   Recent Labs Lab 08/19/14 1436 08/20/14 0527 08/21/14 0500 08/22/14 0528 08/23/14 0525 08/24/14 0600 08/25/14 0521  NA 135 134* 134* 135 134* 135 135  K 3.3* 3.5 3.9 3.6 3.6 4.5 3.7  CL 99* 99* 100* 101 100* 101 99*  CO2 25 25 22 24 23 24 24   GLUCOSE 141* 181* 233* 119* 84 114* 218*  BUN 35* 35* 37* 39* 40* 45* 51*  CREATININE 5.29* 5.71* 5.91* 6.04* 6.17* 6.63* 7.12*  ALBUMIN 1.6* 1.5* 1.4* 1.4* 1.3* 1.4* 1.5*  CALCIUM 8.0* 7.9* 7.8* 7.8* 8.0* 7.9* 8.1*  PHOS 3.9 4.2 4.5 4.2 4.3 4.8* 4.8*   Liver Function Tests:  Recent Labs Lab 08/23/14 0525 08/24/14 0600 08/25/14 0521  ALBUMIN 1.3* 1.4* 1.5*   No results for input(s): LIPASE, AMYLASE in the last 168 hours. No results for input(s): AMMONIA in the last 168 hours. CBC:  Recent Labs Lab 08/21/14 0500 08/22/14 0528 08/23/14 0525 08/24/14 0600 08/25/14 0521  WBC 15.6* 15.4* 14.1* 14.6* 12.1*  HGB 7.5* 7.2* 6.8* 7.8* 7.7*  HCT 22.7* 21.6* 20.8* 24.3* 23.9*  MCV 88.0 87.1 87.0 86.8 87.9  PLT 389 375 401* 425* 427*   Cardiac Enzymes:  Recent Labs Lab 08/22/14 1550  CKTOTAL 95   CBG:  Recent Labs Lab 08/24/14 0804 08/24/14 1200 08/24/14 1644 08/24/14 2107 08/25/14 0821  GLUCAP 104* 129* 136* 145* 206*    Iron Studies: No results for input(s):  IRON, TIBC, TRANSFERRIN, FERRITIN in the last 72 hours. Studies/Results: No results found. Marland Kitchen amLODipine  10 mg Oral Daily  .  ceFAZolin (ANCEF) IV  2 g Intravenous Q24H  . darbepoetin (ARANESP) injection - NON-DIALYSIS  100 mcg Subcutaneous Q Sun-1800  . feeding supplement (NEPRO CARB STEADY)  237 mL Oral BID BM  . furosemide  60 mg Intravenous Q12H  . heparin  5,000 Units Subcutaneous 3 times per day  . insulin aspart  0-5 Units Subcutaneous QHS  . insulin aspart  0-9 Units Subcutaneous TID WC  . insulin glargine  12 Units Subcutaneous QHS  . levothyroxine  100 mcg Oral QAC breakfast  . megestrol  40 mg Oral Daily  . metoprolol tartrate  50 mg Oral BID  . multivitamin with minerals  1 tablet Oral Daily  . OxyCODONE  20 mg Oral Q12H  . senna-docusate  2 tablet Oral QHS  . sodium chloride  1,000 mL Intravenous Once  . sodium chloride  3 mL Intravenous Q12H    BMET    Component Value Date/Time   NA 135 08/25/2014 0521   NA 133* 01/18/2013 1106   K 3.7 08/25/2014 0521   CL 99* 08/25/2014 0521   CO2 24 08/25/2014 0521  GLUCOSE 218* 08/25/2014 0521   GLUCOSE 929* 01/18/2013 1106   BUN 51* 08/25/2014 0521   BUN 37* 01/18/2013 1106   CREATININE 7.12* 08/25/2014 0521   CALCIUM 8.1* 08/25/2014 0521   GFRNONAA 7* 08/25/2014 0521   GFRAA 8* 08/25/2014 0521   CBC    Component Value Date/Time   WBC 12.1* 08/25/2014 0521   WBC 20.0* 01/18/2013 1106   RBC 2.72* 08/25/2014 0521   RBC 2.92* 08/10/2014 1220   RBC 3.70* 01/18/2013 1106   HGB 7.7* 08/25/2014 0521   HCT 23.9* 08/25/2014 0521   PLT 427* 08/25/2014 0521   MCV 87.9 08/25/2014 0521   MCH 28.3 08/25/2014 0521   MCH 29.5 01/18/2013 1106   MCHC 32.2 08/25/2014 0521   MCHC 31.9 01/18/2013 1106   RDW 16.2* 08/25/2014 0521   RDW 12.7 01/18/2013 1106   LYMPHSABS 0.9 08/13/2014 0540   LYMPHSABS 1.4 01/18/2013 1106   MONOABS 1.4* 08/13/2014 0540   EOSABS 0.1 08/13/2014 0540   EOSABS 0.0 01/18/2013 1106   BASOSABS 0.1  08/13/2014 0540   BASOSABS 0.0 01/18/2013 1106     Assessment/Plan:  1. AKI/CKD vs. Progressive CKD to stage 5- has advanced CKD at baseline due to poorly controlled DM and HTN further complicated by NSAIDs as well as likely ischemic ATN. Increased UOP. Underwent HD on 7/5 and 08/17/14 and since her last treatment her scr continues to slowly rise.  1. Remains volume overloaded despite good response to IV lasix and albumin. 2. She has had progressive increase in BUN/Cr so will re-initiate dialysis today and follow UOP and Scr over the next 24-48 hours (if continues to climb will consider her ESRD and plan HD again on Saturday and arrange for tunneled HD cath and outpt dialysis). 3. Discussed treatment options including dialysis as well as kidney pancrease transplant and if she is now ESRD will handle all of the referrals from the dialysis unit/SW. 2. Pyomyositis- s/p I&D of right arm as well as right thigh/leg with indwelling drains. On Ancef for MSSA 3. CKD stage 4-5 due to diabetic nephropathy- pt with h/o poorly controlled type 1 DM now with progressive CKD (Scr almost doubled since December 2015 with baseline of 3 and no labs until June 2016) 1. Discussed need for better DM control 2. Will need referral for kidney pancreas once she stabilizes 4. Hyponatremia- due to #1, improved with HD 5. DM- poorly controlled, management per primary svc 6. Anemia- likely due to CKD s/p Aranesp 08/21/14.  7. Vascular access- will likely need tunneled HD cath if she does not have return of renal function. Will need to hold off on AVF until pyomyositis has resolved.  Loomis A

## 2014-08-25 NOTE — Progress Notes (Signed)
PT Cancellation Note  Patient Details Name: Tracy [name] MRN: [id number] DOB: 07-14-1985   Cancelled Treatment:    Reason Eval/Treat Not Completed: Medical issues which prohibited therapy;Patient at procedure or test/unavailable.  Patient with Hgb 7.7 to get blood, and now on HD.  Will return tomorrow for PT session.   Despina Pole 08/25/2014, 5:59 PM Carita Pian. Sanjuana Kava, [place] Pager [phone]

## 2014-08-25 NOTE — Progress Notes (Signed)
Patient ID: Tracy [name], female   DOB: [birth year], 29 y.o.   MRN: [id number]         [facility]    Date of Admission:  08/09/2014   Total days of antibiotics 17 Day 9 of cefazolin Principal Problem:   Myositis Active Problems:   Proliferative diabetic retinopathy   Hypothyroidism   Normocytic anemia   Acute renal failure superimposed on stage 4 chronic kidney disease   Seizure   Sepsis   DM I (diabetes mellitus, type I), uncontrolled   Essential hypertension   Acute respiratory failure with hypoxia   . amLODipine  10 mg Oral Daily  .  ceFAZolin (ANCEF) IV  2 g Intravenous Q24H  . darbepoetin (ARANESP) injection - NON-DIALYSIS  100 mcg Subcutaneous Q Sun-1800  . feeding supplement (NEPRO CARB STEADY)  237 mL Oral BID BM  . furosemide  60 mg Intravenous Q12H  . heparin  5,000 Units Subcutaneous 3 times per day  . insulin aspart  0-5 Units Subcutaneous QHS  . insulin aspart  0-9 Units Subcutaneous TID WC  . insulin glargine  12 Units Subcutaneous QHS  . levothyroxine  100 mcg Oral QAC breakfast  . megestrol  40 mg Oral Daily  . metoprolol tartrate  50 mg Oral BID  . multivitamin with minerals  1 tablet Oral Daily  . OxyCODONE  20 mg Oral Q12H  . [START ON 08/26/2014] polyethylene glycol  17 g Oral Daily  . senna-docusate  2 tablet Oral QHS  . sodium chloride  1,000 mL Intravenous Once  . sodium chloride  3 mL Intravenous Q12H    Subjective: [name] is low spirits today after getting word she will need another round of dialysis. She says her pain in her arm and leg are getting better. She is requesting a note to document her absence from work.  Review of Systems: Pertinent items are noted in HPI.  Past Medical History  Diagnosis Date  . Thyroid enlargement     "not on medication at this time"  . Urinary tract infection     hx of  . Anemia    presently on iron supplement  . Diabetes mellitus     insulin pump   . Hypothyroidism   . Anxiety   . HSV-2 (herpes simplex virus 2) infection   . HSV-1 (herpes simplex virus 1) infection   . Detached retina   . Yeast infection     took diflucan saturday   . Hypertension   . Irregular periods 07/05/2014  . Vaginal discharge 07/05/2014    History  Substance Use Topics  . Smoking status: Never Smoker   . Smokeless tobacco: Never Used  . Alcohol Use: No    Family History  Problem Relation Age of Onset  . Cancer Paternal Grandfather     prostate  . Hyperlipidemia Paternal Grandfather   . Stroke Paternal Grandfather    Allergies  Allergen Reactions  . Penicillins Hives and Swelling  . Sulfa Antibiotics Hives    OBJECTIVE: Blood pressure 120/62, pulse 79, temperature 98.5 F (36.9 C), temperature source Oral, resp. rate 18, height 5\' 3"  (1.6 m), weight 88.6 kg (195 lb 5.2 oz), last menstrual period 07/18/2014, SpO2 93 %. General: Sitting in recliner watching TV with her sister. Skin: Surgical sites without erythema or tenderness.  Lab Results Lab Results  Component Value Date   WBC 12.1* 08/25/2014   HGB 7.7* 08/25/2014   HCT 23.9* 08/25/2014  MCV 87.9 08/25/2014   PLT 427* 08/25/2014    Lab Results  Component Value Date   CREATININE 7.12* 08/25/2014   BUN 51* 08/25/2014   NA 135 08/25/2014   K 3.7 08/25/2014   CL 99* 08/25/2014   CO2 24 08/25/2014    Lab Results  Component Value Date   ALT 18 08/10/2014   AST 38 08/10/2014   ALKPHOS 166* 08/10/2014   BILITOT 0.6 08/10/2014     Microbiology: Recent Results (from the past 240 hour(s))  Culture, routine-abscess     Status: None   Collection Time: 08/17/14  3:38 PM  Result Value Ref Range Status   Specimen Description ABSCESS RIGHT THIGH  Final   Special Requests Normal  Final   Gram Stain   Final    FEW WBC PRESENT, PREDOMINANTLY MONONUCLEAR NO SQUAMOUS EPITHELIAL CELLS SEEN MODERATE GRAM POSITIVE  COCCI IN PAIRS IN CLUSTERS Performed at [facility]    Culture   Final    ABUNDANT STAPHYLOCOCCUS AUREUS Note: RIFAMPIN AND GENTAMICIN SHOULD NOT BE USED AS SINGLE DRUGS FOR TREATMENT OF STAPH INFECTIONS. Performed at [facility]    Report Status 08/21/2014 FINAL  Final   Organism ID, Bacteria STAPHYLOCOCCUS AUREUS  Final      Susceptibility   Staphylococcus aureus - MIC*    CLINDAMYCIN <=0.25 SENSITIVE Sensitive     ERYTHROMYCIN <=0.25 SENSITIVE Sensitive     GENTAMICIN <=0.5 SENSITIVE Sensitive     LEVOFLOXACIN 0.25 SENSITIVE Sensitive     OXACILLIN <=0.25 SENSITIVE Sensitive     PENICILLIN 0.06 SENSITIVE Sensitive     RIFAMPIN <=0.5 SENSITIVE Sensitive     TRIMETH/SULFA <=10 SENSITIVE Sensitive     VANCOMYCIN <=0.5 SENSITIVE Sensitive     TETRACYCLINE <=1 SENSITIVE Sensitive     MOXIFLOXACIN <=0.25 SENSITIVE Sensitive     * ABUNDANT STAPHYLOCOCCUS AUREUS  Anaerobic culture     Status: None   Collection Time: 08/17/14  3:38 PM  Result Value Ref Range Status   Specimen Description ABSCESS RIGHT THIGH  Final   Special Requests Normal  Final   Gram Stain   Final    FEW WBC PRESENT, PREDOMINANTLY MONONUCLEAR NO SQUAMOUS EPITHELIAL CELLS SEEN MODERATE GRAM POSITIVE COCCI IN PAIRS IN CLUSTERS Performed at [facility]    Culture   Final    NO ANAEROBES ISOLATED Performed at [facility]    Report Status 08/23/2014 FINAL  Final  Anaerobic culture     Status: None   Collection Time: 08/20/14 10:40 AM  Result Value Ref Range Status   Specimen Description TISSUE  Final   Special Requests DEEP THIGH MUSCLE POF ANCEF CUP A  Final   Gram Stain   Final    ABUNDANT WBC PRESENT,BOTH PMN AND MONONUCLEAR NO SQUAMOUS EPITHELIAL CELLS SEEN RARE GRAM POSITIVE COCCI IN CLUSTERS Performed at [facility]    Culture   Final    NO ANAEROBES ISOLATED Performed at [facility]    Report Status 08/25/2014 FINAL  Final  Tissue culture      Status: None   Collection Time: 08/20/14 10:40 AM  Result Value Ref Range Status   Specimen Description TISSUE THIGH  Final   Special Requests POF ON ANCEF  Final   Gram Stain   Final    MODERATE WBC PRESENT,BOTH PMN AND MONONUCLEAR NO SQUAMOUS EPITHELIAL CELLS SEEN FEW GRAM POSITIVE COCCI IN CLUSTERS Performed at [facility]    Culture   Final  FEW STAPHYLOCOCCUS AUREUS Note: RIFAMPIN AND GENTAMICIN SHOULD NOT BE USED AS SINGLE DRUGS FOR TREATMENT OF STAPH INFECTIONS. Performed at [facility]    Report Status 08/23/2014 FINAL  Final   Organism ID, Bacteria STAPHYLOCOCCUS AUREUS  Final      Susceptibility   Staphylococcus aureus - MIC*    CLINDAMYCIN <=0.25 SENSITIVE Sensitive     ERYTHROMYCIN <=0.25 SENSITIVE Sensitive     GENTAMICIN <=0.5 SENSITIVE Sensitive     LEVOFLOXACIN 0.25 SENSITIVE Sensitive     OXACILLIN <=0.25 SENSITIVE Sensitive     PENICILLIN 0.12 SENSITIVE Sensitive     RIFAMPIN <=0.5 SENSITIVE Sensitive     TRIMETH/SULFA <=10 SENSITIVE Sensitive     VANCOMYCIN 1 SENSITIVE Sensitive     TETRACYCLINE <=1 SENSITIVE Sensitive     MOXIFLOXACIN <=0.25 SENSITIVE Sensitive     * FEW STAPHYLOCOCCUS AUREUS    Assessment: We will continue cefazolin until patient is discharged at which point we will change her over to renally-based oral cephalexin.  Plan: 1. Continue IV cefazolin for MSSA pyomyositis  Loleta Chance, MD [facility] 08/25/2014, 1:08 PM   Addendum: I agree with continuing IV cefazolin until discharge and converting to oral cephalexin.  Michel Bickers, MD [facility] [phone] pager   [phone] cell 08/25/2014, 3:01 PM

## 2014-08-26 DIAGNOSIS — I10 Essential (primary) hypertension: Secondary | ICD-10-CM

## 2014-08-26 LAB — RENAL FUNCTION PANEL
ANION GAP: 12 (ref 5–15)
Albumin: 1.6 g/dL — ABNORMAL LOW (ref 3.5–5.0)
BUN: 35 mg/dL — ABNORMAL HIGH (ref 6–20)
CO2: 24 mmol/L (ref 22–32)
Calcium: 8 mg/dL — ABNORMAL LOW (ref 8.9–10.3)
Chloride: 98 mmol/L — ABNORMAL LOW (ref 101–111)
Creatinine, Ser: 5.22 mg/dL — ABNORMAL HIGH (ref 0.44–1.00)
GFR calc Af Amer: 12 mL/min — ABNORMAL LOW (ref >60)
GFR, EST NON AFRICAN AMERICAN: 10 mL/min — AB (ref >60)
Glucose, Bld: 329 mg/dL — ABNORMAL HIGH (ref 65–99)
PHOSPHORUS: 3.6 mg/dL (ref 2.5–4.6)
Potassium: 4.2 mmol/L (ref 3.5–5.1)
SODIUM: 134 mmol/L — AB (ref 135–145)

## 2014-08-26 LAB — GLUCOSE, CAPILLARY
GLUCOSE-CAPILLARY: 195 mg/dL — AB (ref 65–99)
Glucose-Capillary: 247 mg/dL — ABNORMAL HIGH (ref 65–99)
Glucose-Capillary: 329 mg/dL — ABNORMAL HIGH (ref 65–99)
Glucose-Capillary: 184 mg/dL — ABNORMAL HIGH (ref 65–99)

## 2014-08-26 LAB — CBC
HEMATOCRIT: 23.9 % — AB (ref 36.0–46.0)
Hemoglobin: 7.6 g/dL — ABNORMAL LOW (ref 12.0–15.0)
MCH: 28.1 pg (ref 26.0–34.0)
MCHC: 31.8 g/dL (ref 30.0–36.0)
MCV: 88.5 fL (ref 78.0–100.0)
Platelets: 360 10*3/uL (ref 150–400)
RBC: 2.7 MIL/uL — ABNORMAL LOW (ref 3.87–5.11)
RDW: 16 % — ABNORMAL HIGH (ref 11.5–15.5)
WBC: 12.7 10*3/uL — ABNORMAL HIGH (ref 4.0–10.5)

## 2014-08-26 MED ORDER — INSULIN ASPART 100 UNIT/ML ~~LOC~~ SOLN
0.0000 [IU] | Freq: Three times a day (TID) | SUBCUTANEOUS | Status: DC
  Administered 2014-08-26: 3 [IU] via SUBCUTANEOUS
  Administered 2014-08-26: 4 [IU] via SUBCUTANEOUS
  Administered 2014-08-27: 5 [IU] via SUBCUTANEOUS
  Administered 2014-08-27: 3 [IU] via SUBCUTANEOUS

## 2014-08-26 MED ORDER — SODIUM CHLORIDE 0.9 % IJ SOLN
10.0000 mL | INTRAMUSCULAR | Status: DC | PRN
  Administered 2014-08-27: 20 mL
  Administered 2014-08-28: 10 mL
  Filled 2014-08-26 (×2): qty 40

## 2014-08-26 MED ORDER — INSULIN ASPART 100 UNIT/ML ~~LOC~~ SOLN: 0.0000 [IU] | Freq: Three times a day (TID) | SUBCUTANEOUS | Status: DC

## 2014-08-26 NOTE — Progress Notes (Signed)
OT Discharge Note  Patient Details Name: Tracy [name] MRN: [id number] DOB: 1985/10/01   Cancelled Treatment:     Pt has been routinely toileting, bathing and dressing with assist of staff or her family and walks regularly with IV pole or RW.  Recommend RW for home.  Family has access to [facility] if needed and pt is aware she may use 3 in 1 as a shower seat when allowed to shower. Pt is continuing to elevate R UE for edema and is effectively using in ADL including self feeding.  Pt reports feeling much better and is in good spirits.  No further OT needs. Signing off.  Malka So 08/26/2014, 4:48 PM  [phone]

## 2014-08-26 NOTE — Progress Notes (Signed)
PROGRESS NOTE  Tracy [name] [id number] DOB: [birth year] DOA: 08/09/2014 PCP: [name]  HPI/Recap of past 24 hours: This is a 29 year old Caucasian female with history of diabetes mellitus on insulin pump but poor control, hypothyroidism poor compliance she stopped taking her Synthroid 2 months ago due to insurance issues and medication affordability, chronic kidney disease stage IV baseline creatinine close to 3, who was admitted to the hospital 11 days ago for 2 week history of right arm and right leg pain, upon further workup with MRI was found that she had extensive myositis in both right-sided extremities. She was initially seen at [facility] in [place] and then transferred to [facility].  She was seen here by hand surgery, general surgery, orthopedic surgery, renal, infectious disease, phone consultation with rheumatologist Dr. Amil Amen. Initially no clear reason of myositis could be found, however since her clinical course did not show any improvement she was taken to the OR first by Dr. Amedeo Plenty about 5 days ago and right arm fluid was drained which was suggestive of infectious myositis, she has been on IV antibiotics throughout her hospital stay. On 08/20/2014 she returned to the OR again by orthopedic surgery for drainage of right leg fluid collection. Her workup certainly suggests infectious myositis however reason for her bacteremia is not clear at all. ID is on board as well.  Note due to her illness patient's has progressed from chronic kidney disease stage IV.  She received dialysis on 7/5 and 7/6, but not since then. Renal to decide whether this is ESRD or not.  In addition, nursing noted on 7/10 morning that patient is hypoxic off of oxygen and currently requiring 3 L to say above 90% patient has a mild nonproductive cough and some low-grade temperatures. Chest x-ray done only noted mild pleural effusions but no infiltrate. attempts have been made to aggressively  diurese her and she's only had a moderate response. Her BUN and creatinine have subsequently risen and nephrology made decision to dialyze her again today 7/14.     On 7/12, hemoglobin dropped below 7 so receiving blood transfusion.  patient herself doing okay today. Her pain control is much better on extended release OxyContin that she has not required breakthrough pain medication at all for 2 days .  Has not had a bowel movement in several days   Subjective Eager to go home. States feeling better. Less SOB today  Assessment/Plan: Principal Problem: Mouth ulcer: Cont with PRN Magic mouthwash with lidocaine.  Mouth ulcer improving    Myositis: Possibly from post physical string 2 weeks prior from throwing horse feed along with muscle injury with secondary infection. Status post right arm muscle biopsy along with fluid drainage and JP drain as well as right leg ET guided drainage. Source of bacteremia remains unclear. Echocardiogram was normal.  White blood cell is slowly trending downward .    Active Problems:   Proliferative diabetic retinopathy   Hypothyroidism: Started on Synthroid after noncompliance.    Normocytic anemia: Iron deficiency and also in part from renal failure and chronic disease. Status post 2 units packed red blood cells transfusion on 7/6 and 7/12. Stable    Acute renal failure superimposed on stage 4 chronic kidney disease: Previous creatinine baseline at 3. Possibly from hypovolemia from nausea and vomiting plus NSAID use prior to admission causing ATN. Voiding effort toxins. Hydrating and renal ultrasound noting cyst but no obstruction. Urine output stable. Followed by nephrology. Interventional radiology placed ounces catheter on 7/4  and patient underwent dialysis on 7/5. Overnight pt underwent HD with improvement in Cr from 7.12 to 5.22. Nephrology continues to follow. Plan to repeat renal function in AM and determine at that time if pt would require further HD  session.  Acute respiratory failure with hypoxia: Nursing noted that patient required at least 3 L of oxygen recently. Chest x-ray with no signs of infiltrate, but did note mild pleural effusions leading [place] to start diuresing her aggressively. Given no fever and white count continuing to improve on Ancef, will not treat this as healthcare associated pneumonia yet. Suspect resp failure secondary to vol overload    DM I (diabetes mellitus, type I), uncontrolled with renal disease: Poor outpatient control. A1c of 8.9. Currently on Lantus plus sliding scale.  Very labile blood sugars. Have discontinued scheduled NovoLog and decreased sliding scale to sensitive. Per diabetic coordinator, will add pre-meal novolog 0-6mg , 1 unit per each 10gm in carbs eaten.    Essential hypertension   Acute respiratory failure with hypoxia: Unclear etiology. Continue oxygen. Suspect volume overload. Patient remains +11 L of fluid. Increased Lasix with only some response. Have encouraged inspirometer use  Pain control: responding well to extended release OxyContin. No longer requiring when necessary medications. Change IVs to by mouth. If she continues this way, decrease oxy extended release from 20 mg to 15 on Friday or Saturday    Code Status: Full code  Family Communication: Mother at the bedside  Disposition Plan: Continue inpatient until infection felt to be treated, decision made about further dialysis and hypoxia resolved/diuresed fully  Consultants:  Case discussed with rheumatology by phone.  Interventional radiology.  Infectious disease.  Nephrology  General surgery  Hand surgery  Orthopedic surgery  Procedures:  Right arm muscle biopsy plus fluid drainage and JP drain placement of right arm done 7/3  Right IJ dialysis catheter by interventional radiology 7/4  Dialysis session starting 7/5  Right leg ultrasound guided aspiration by interventional radiology 7/6  Leg expiration with  drainage of fluid done 7/9  Transfusion 2 units packed red blood cells on  7/6 And 7/12  Antibiotics:  IV vancomycin 6/28-7/5  IV Azactam 6/28-7/4  IV clindamycin 7/4-7/5  IV Ancef 7/6-present   Objective: BP 126/66 mmHg  Pulse 78  Temp(Src) 98.8 F (37.1 C) (Oral)  Resp 17  Ht 5\' 3"  (1.6 m)  Wt 82.9 kg (182 lb 12.2 oz)  BMI 32.38 kg/m2  SpO2 95%  LMP 07/18/2014  Intake/Output Summary (Last 24 hours) at 08/26/14 1208 Last data filed at 08/26/14 0900  Gross per 24 hour  Intake    480 ml  Output   2650 ml  Net  -2170 ml   Filed Weights   08/25/14 0412 08/25/14 1535 08/25/14 1835  Weight: 88.6 kg (195 lb 5.2 oz) 85.6 kg (188 lb 11.4 oz) 82.9 kg (182 lb 12.2 oz)    Exam:   General:  Alert and oriented 3, no acute distress  Cardiovascular: Regular rate and rhythm, S1-S2  Respiratory:  more clear to auscultation bilaterally, few rales   Abdomen: Soft, nontender, nondistended, positive bowel sounds  Musculoskeletal: No clubbing or cyanosis, trace edema. Left lower extremity wrapped   Data Reviewed: Basic Metabolic Panel:  Recent Labs Lab 08/22/14 0528 08/23/14 0525 08/24/14 0600 08/25/14 0521 08/26/14 0656  NA 135 134* 135 135 134*  K 3.6 3.6 4.5 3.7 4.2  CL 101 100* 101 99* 98*  CO2 24 23 24 24 24   GLUCOSE 119* 84 114*  218* 329*  BUN 39* 40* 45* 51* 35*  CREATININE 6.04* 6.17* 6.63* 7.12* 5.22*  CALCIUM 7.8* 8.0* 7.9* 8.1* 8.0*  PHOS 4.2 4.3 4.8* 4.8* 3.6   Liver Function Tests:  Recent Labs Lab 08/22/14 0528 08/23/14 0525 08/24/14 0600 08/25/14 0521 08/26/14 0656  ALBUMIN 1.4* 1.3* 1.4* 1.5* 1.6*   No results for input(s): LIPASE, AMYLASE in the last 168 hours. No results for input(s): AMMONIA in the last 168 hours. CBC:  Recent Labs Lab 08/23/14 0525 08/24/14 0600 08/25/14 0521 08/25/14 1555 08/26/14 0656  WBC 14.1* 14.6* 12.1* 13.2* 12.7*  HGB 6.8* 7.8* 7.7* 8.3* 7.6*  HCT 20.8* 24.3* 23.9* 25.5* 23.9*  MCV 87.0 86.8 87.9  87.9 88.5  PLT 401* 425* 427* 485* 360   Cardiac Enzymes:    Recent Labs Lab 08/22/14 1550  CKTOTAL 95   BNP (last 3 results) No results for input(s): BNP in the last 8760 hours.  ProBNP (last 3 results) No results for input(s): PROBNP in the last 8760 hours.  CBG:  Recent Labs Lab 08/25/14 0821 08/25/14 1132 08/25/14 2132 08/26/14 0736 08/26/14 1135  GLUCAP 206* 139* 247* 329* 184*    Recent Results (from the past 240 hour(s))  Culture, routine-abscess     Status: None   Collection Time: 08/17/14  3:38 PM  Result Value Ref Range Status   Specimen Description ABSCESS RIGHT THIGH  Final   Special Requests Normal  Final   Gram Stain   Final    FEW WBC PRESENT, PREDOMINANTLY MONONUCLEAR NO SQUAMOUS EPITHELIAL CELLS SEEN MODERATE GRAM POSITIVE COCCI IN PAIRS IN CLUSTERS Performed at [facility]    Culture   Final    ABUNDANT STAPHYLOCOCCUS AUREUS Note: RIFAMPIN AND GENTAMICIN SHOULD NOT BE USED AS SINGLE DRUGS FOR TREATMENT OF STAPH INFECTIONS. Performed at [facility]    Report Status 08/21/2014 FINAL  Final   Organism ID, Bacteria STAPHYLOCOCCUS AUREUS  Final      Susceptibility   Staphylococcus aureus - MIC*    CLINDAMYCIN <=0.25 SENSITIVE Sensitive     ERYTHROMYCIN <=0.25 SENSITIVE Sensitive     GENTAMICIN <=0.5 SENSITIVE Sensitive     LEVOFLOXACIN 0.25 SENSITIVE Sensitive     OXACILLIN <=0.25 SENSITIVE Sensitive     PENICILLIN 0.06 SENSITIVE Sensitive     RIFAMPIN <=0.5 SENSITIVE Sensitive     TRIMETH/SULFA <=10 SENSITIVE Sensitive     VANCOMYCIN <=0.5 SENSITIVE Sensitive     TETRACYCLINE <=1 SENSITIVE Sensitive     MOXIFLOXACIN <=0.25 SENSITIVE Sensitive     * ABUNDANT STAPHYLOCOCCUS AUREUS  Anaerobic culture     Status: None   Collection Time: 08/17/14  3:38 PM  Result Value Ref Range Status   Specimen Description ABSCESS RIGHT THIGH  Final   Special Requests Normal  Final   Gram Stain   Final    FEW WBC PRESENT, PREDOMINANTLY  MONONUCLEAR NO SQUAMOUS EPITHELIAL CELLS SEEN MODERATE GRAM POSITIVE COCCI IN PAIRS IN CLUSTERS Performed at [facility]    Culture   Final    NO ANAEROBES ISOLATED Performed at [facility]    Report Status 08/23/2014 FINAL  Final  Anaerobic culture     Status: None   Collection Time: 08/20/14 10:40 AM  Result Value Ref Range Status   Specimen Description TISSUE  Final   Special Requests DEEP THIGH MUSCLE POF ANCEF CUP A  Final   Gram Stain   Final    ABUNDANT WBC PRESENT,BOTH PMN AND MONONUCLEAR NO SQUAMOUS  EPITHELIAL CELLS SEEN RARE GRAM POSITIVE COCCI IN CLUSTERS Performed at [facility]    Culture   Final    NO ANAEROBES ISOLATED Performed at [facility]    Report Status 08/25/2014 FINAL  Final  Tissue culture     Status: None   Collection Time: 08/20/14 10:40 AM  Result Value Ref Range Status   Specimen Description TISSUE THIGH  Final   Special Requests POF ON ANCEF  Final   Gram Stain   Final    MODERATE WBC PRESENT,BOTH PMN AND MONONUCLEAR NO SQUAMOUS EPITHELIAL CELLS SEEN FEW GRAM POSITIVE COCCI IN CLUSTERS Performed at [facility]    Culture   Final    FEW STAPHYLOCOCCUS AUREUS Note: RIFAMPIN AND GENTAMICIN SHOULD NOT BE USED AS SINGLE DRUGS FOR TREATMENT OF STAPH INFECTIONS. Performed at [facility]    Report Status 08/23/2014 FINAL  Final   Organism ID, Bacteria STAPHYLOCOCCUS AUREUS  Final      Susceptibility   Staphylococcus aureus - MIC*    CLINDAMYCIN <=0.25 SENSITIVE Sensitive     ERYTHROMYCIN <=0.25 SENSITIVE Sensitive     GENTAMICIN <=0.5 SENSITIVE Sensitive     LEVOFLOXACIN 0.25 SENSITIVE Sensitive     OXACILLIN <=0.25 SENSITIVE Sensitive     PENICILLIN 0.12 SENSITIVE Sensitive     RIFAMPIN <=0.5 SENSITIVE Sensitive     TRIMETH/SULFA <=10 SENSITIVE Sensitive     VANCOMYCIN 1 SENSITIVE Sensitive     TETRACYCLINE <=1 SENSITIVE Sensitive     MOXIFLOXACIN <=0.25 SENSITIVE Sensitive     *  FEW STAPHYLOCOCCUS AUREUS     Studies: Dg Chest 2 View  08/25/2014   CLINICAL DATA:  CHEST PAIN AND BACK PAIN WHEN SHE COUGHS. NO FEVER X 24 HRS. NO N/V. NON SMOKER. HTN AND DIAB. PNA.  EXAM: CHEST  2 VIEW  COMPARISON:  08/21/2014  FINDINGS: Moderate bilateral pleural effusions, without significant change when allowing for differences in patient positioning. No evidence of pulmonary edema. There is dependent lower lobe opacity adjacent to the pleural effusions most likely all atelectasis. Pneumonia is not excluded. No pneumothorax.  Cardiac silhouette is normal in size. No mediastinal or hilar masses or evidence of adenopathy.  Right internal jugular dual-lumen central venous catheter is stable with its tip projecting in the right atrium.  IMPRESSION: 1. Moderate bilateral pleural effusions. No pulmonary edema. Dependent lower lobe opacity is likely atelectasis. Pneumonia is not excluded. 2. No significant change from the prior study.   Electronically Signed   By: Lajean Manes M.D.   On: 08/25/2014 21:15    Scheduled Meds: . amLODipine  10 mg Oral Daily  .  ceFAZolin (ANCEF) IV  2 g Intravenous Q24H  . darbepoetin (ARANESP) injection - NON-DIALYSIS  100 mcg Subcutaneous Q Sun-1800  . feeding supplement (NEPRO CARB STEADY)  237 mL Oral BID BM  . furosemide  60 mg Intravenous Q12H  . heparin  5,000 Units Subcutaneous 3 times per day  . insulin aspart  0-5 Units Subcutaneous QHS  . insulin aspart  0-6 Units Subcutaneous TID WC  . insulin aspart  0-9 Units Subcutaneous TID WC  . insulin glargine  12 Units Subcutaneous QHS  . levothyroxine  100 mcg Oral QAC breakfast  . megestrol  400 mg Oral Daily  . metoprolol tartrate  50 mg Oral BID  . multivitamin with minerals  1 tablet Oral Daily  . OxyCODONE  20 mg Oral Q12H  . polyethylene glycol  17 g Oral Daily  .  senna-docusate  2 tablet Oral QHS  . sodium chloride  1,000 mL Intravenous Once  . sodium chloride  3 mL Intravenous Q12H     Continuous Infusions: . sodium chloride        Symphony Demuro K  [name] Pager [phone]. If 7PM-7AM, please contact night-coverage at [email or web address], password [facility] 08/26/2014, 12:08 PM  LOS: 17 days

## 2014-08-26 NOTE — Progress Notes (Signed)
Patient ID: Tracy [name], female   DOB: 1985-02-25, 29 y.o.   MRN: [id number]         [facility]    Date of Admission:  08/09/2014   Total days of antibiotics 18 Day 10 of cefazolin Principal Problem:   Myositis Active Problems:   Proliferative diabetic retinopathy   Hypothyroidism   Normocytic anemia   Acute renal failure superimposed on stage 4 chronic kidney disease   Seizure   Sepsis   DM I (diabetes mellitus, type I), uncontrolled   Essential hypertension   Acute respiratory failure with hypoxia   . amLODipine  10 mg Oral Daily  .  ceFAZolin (ANCEF) IV  2 g Intravenous Q24H  . darbepoetin (ARANESP) injection - NON-DIALYSIS  100 mcg Subcutaneous Q Sun-1800  . feeding supplement (NEPRO CARB STEADY)  237 mL Oral BID BM  . furosemide  60 mg Intravenous Q12H  . heparin  5,000 Units Subcutaneous 3 times per day  . insulin aspart  0-5 Units Subcutaneous QHS  . insulin aspart  0-6 Units Subcutaneous TID WC  . insulin glargine  12 Units Subcutaneous QHS  . levothyroxine  100 mcg Oral QAC breakfast  . megestrol  400 mg Oral Daily  . metoprolol tartrate  50 mg Oral BID  . multivitamin with minerals  1 tablet Oral Daily  . OxyCODONE  20 mg Oral Q12H  . polyethylene glycol  17 g Oral Daily  . senna-docusate  2 tablet Oral QHS  . sodium chloride  1,000 mL Intravenous Once  . sodium chloride  3 mL Intravenous Q12H    Subjective: Tracy [name] is well [name]. Says she has developed a slight cough. The pain in her surgical sites is unchanged.  Review of Systems: Pertinent items are noted in HPI.  Past Medical History  Diagnosis Date  . Thyroid enlargement     "not on medication at this time"  . Urinary tract infection     hx of  . Anemia     presently on iron supplement  . Diabetes mellitus     insulin pump   . Hypothyroidism   . Anxiety   . HSV-2 (herpes  simplex virus 2) infection   . HSV-1 (herpes simplex virus 1) infection   . Detached retina   . Yeast infection     took diflucan saturday   . Hypertension   . Irregular periods 07/05/2014  . Vaginal discharge 07/05/2014    History  Substance Use Topics  . Smoking status: Never Smoker   . Smokeless tobacco: Never Used  . Alcohol Use: No    Family History  Problem Relation Age of Onset  . Cancer Paternal Grandfather     prostate  . Hyperlipidemia Paternal Grandfather   . Stroke Paternal Grandfather    Allergies  Allergen Reactions  . Penicillins Hives and Swelling  . Sulfa Antibiotics Hives    OBJECTIVE: Blood pressure 126/66, pulse 78, temperature 98.8 F (37.1 C), temperature source Oral, resp. rate 17, height 5\' 3"  (1.6 m), weight 82.9 kg (182 lb 12.2 oz), last menstrual period 07/18/2014, SpO2 95 %. General: Sitting in recliner comfortably. Skin: No erythema or tenderness over surgical sites.  Lab Results Lab Results  Component Value Date   WBC 12.7* 08/26/2014   HGB 7.6* 08/26/2014   HCT 23.9* 08/26/2014   MCV 88.5 08/26/2014   PLT 360 08/26/2014    Lab Results  Component Value Date   CREATININE 5.22* 08/26/2014  BUN 35* 08/26/2014   NA 134* 08/26/2014   K 4.2 08/26/2014   CL 98* 08/26/2014   CO2 24 08/26/2014    Lab Results  Component Value Date   ALT 18 08/10/2014   AST 38 08/10/2014   ALKPHOS 166* 08/10/2014   BILITOT 0.6 08/10/2014     Microbiology: Recent Results (from the past 240 hour(s))  Culture, routine-abscess     Status: None   Collection Time: 08/17/14  3:38 PM  Result Value Ref Range Status   Specimen Description ABSCESS RIGHT THIGH  Final   Special Requests Normal  Final   Gram Stain   Final    FEW WBC PRESENT, PREDOMINANTLY MONONUCLEAR NO SQUAMOUS EPITHELIAL CELLS SEEN MODERATE GRAM POSITIVE COCCI IN PAIRS IN CLUSTERS Performed at [facility]    Culture   Final    ABUNDANT STAPHYLOCOCCUS AUREUS Note: RIFAMPIN  AND GENTAMICIN SHOULD NOT BE USED AS SINGLE DRUGS FOR TREATMENT OF STAPH INFECTIONS. Performed at [facility]    Report Status 08/21/2014 FINAL  Final   Organism ID, Bacteria STAPHYLOCOCCUS AUREUS  Final      Susceptibility   Staphylococcus aureus - MIC*    CLINDAMYCIN <=0.25 SENSITIVE Sensitive     ERYTHROMYCIN <=0.25 SENSITIVE Sensitive     GENTAMICIN <=0.5 SENSITIVE Sensitive     LEVOFLOXACIN 0.25 SENSITIVE Sensitive     OXACILLIN <=0.25 SENSITIVE Sensitive     PENICILLIN 0.06 SENSITIVE Sensitive     RIFAMPIN <=0.5 SENSITIVE Sensitive     TRIMETH/SULFA <=10 SENSITIVE Sensitive     VANCOMYCIN <=0.5 SENSITIVE Sensitive     TETRACYCLINE <=1 SENSITIVE Sensitive     MOXIFLOXACIN <=0.25 SENSITIVE Sensitive     * ABUNDANT STAPHYLOCOCCUS AUREUS  Anaerobic culture     Status: None   Collection Time: 08/17/14  3:38 PM  Result Value Ref Range Status   Specimen Description ABSCESS RIGHT THIGH  Final   Special Requests Normal  Final   Gram Stain   Final    FEW WBC PRESENT, PREDOMINANTLY MONONUCLEAR NO SQUAMOUS EPITHELIAL CELLS SEEN MODERATE GRAM POSITIVE COCCI IN PAIRS IN CLUSTERS Performed at [facility]    Culture   Final    NO ANAEROBES ISOLATED Performed at [facility]    Report Status 08/23/2014 FINAL  Final  Anaerobic culture     Status: None   Collection Time: 08/20/14 10:40 AM  Result Value Ref Range Status   Specimen Description TISSUE  Final   Special Requests DEEP THIGH MUSCLE POF ANCEF CUP A  Final   Gram Stain   Final    ABUNDANT WBC PRESENT,BOTH PMN AND MONONUCLEAR NO SQUAMOUS EPITHELIAL CELLS SEEN RARE GRAM POSITIVE COCCI IN CLUSTERS Performed at [facility]    Culture   Final    NO ANAEROBES ISOLATED Performed at [facility]    Report Status 08/25/2014 FINAL  Final  Tissue culture     Status: None   Collection Time: 08/20/14 10:40 AM  Result Value Ref Range Status   Specimen Description TISSUE THIGH  Final    Special Requests POF ON ANCEF  Final   Gram Stain   Final    MODERATE WBC PRESENT,BOTH PMN AND MONONUCLEAR NO SQUAMOUS EPITHELIAL CELLS SEEN FEW GRAM POSITIVE COCCI IN CLUSTERS Performed at [facility]    Culture   Final    FEW STAPHYLOCOCCUS AUREUS Note: RIFAMPIN AND GENTAMICIN SHOULD NOT BE USED AS SINGLE DRUGS FOR TREATMENT OF STAPH INFECTIONS. Performed at [facility]    Report Status 08/23/2014 FINAL  Final   Organism ID, Bacteria STAPHYLOCOCCUS AUREUS  Final      Susceptibility   Staphylococcus aureus - MIC*    CLINDAMYCIN <=0.25 SENSITIVE Sensitive     ERYTHROMYCIN <=0.25 SENSITIVE Sensitive     GENTAMICIN <=0.5 SENSITIVE Sensitive     LEVOFLOXACIN 0.25 SENSITIVE Sensitive     OXACILLIN <=0.25 SENSITIVE Sensitive     PENICILLIN 0.12 SENSITIVE Sensitive     RIFAMPIN <=0.5 SENSITIVE Sensitive     TRIMETH/SULFA <=10 SENSITIVE Sensitive     VANCOMYCIN 1 SENSITIVE Sensitive     TETRACYCLINE <=1 SENSITIVE Sensitive     MOXIFLOXACIN <=0.25 SENSITIVE Sensitive     * FEW STAPHYLOCOCCUS AUREUS    Assessment: We recommend continuing IV cefazolin for MSSA pyomyositis while Tracy [name] is inpatient, then changing her to renally-dosed oral cephalexin for 2 more weeks (stop date of antibiotics 7/29). Her surgical sites appear to be healing well.  Plan: 1. Continue IV cefazolin until discharge 2. Upon discharge initiate oral cephalexin 500mg  twice daily, stop date 7/29  Loleta Chance, MD [facility] 08/26/2014, 2:58 PM   Addendum: I have seen and examined Tracy [name] and discussed her care with Dr. Melburn Hake. Tracy [name] is improving steadily. I recommend continuing antibiotics for 2 more weeks. This will give her a total of 4 weeks of therapy for her MSSA soft tissue infection of her right arm and right leg, including 3 weeks of antibiotics since her last positive culture 1 week ago. She can switch to oral  cephalexin renally dosed upon discharge and take antibiotics until 09/09/2014. I will sign off now but please call if I can be of further assistance.  Michel Bickers, MD [facility] [place] [facility] [phone] pager   [phone] cell 08/26/2014, 4:59 PM

## 2014-08-26 NOTE — Progress Notes (Signed)
Patient ID: Tracy [name], female   DOB: [birth year], 29 y.o.   MRN: [id number] S:feels well O:BP 126/66 mmHg  Pulse 78  Temp(Src) 98.8 F (37.1 C) (Oral)  Resp 17  Ht 5\' 3"  (1.6 m)  Wt 82.9 kg (182 lb 12.2 oz)  BMI 32.38 kg/m2  SpO2 95%  LMP 07/18/2014  Intake/Output Summary (Last 24 hours) at 08/26/14 0919 Last data filed at 08/25/14 1857  Gross per 24 hour  Intake    240 ml  Output   2650 ml  Net  -2410 ml   Intake/Output: I/O last 3 completed shifts: In: 600 [P.O.:600] Out: 4250 [Urine:2250; Other:2000]  Intake/Output this shift:    Weight change: -3 kg (-6 lb 9.8 oz) Gen:WD WN WF in NAD CVS:nor ub Resp:cta [id number] Ext:+edema   Recent Labs Lab 08/20/14 0527 08/21/14 0500 08/22/14 0528 08/23/14 0525 08/24/14 0600 08/25/14 0521 08/26/14 0656  NA 134* 134* 135 134* 135 135 134*  K 3.5 3.9 3.6 3.6 4.5 3.7 4.2  CL 99* 100* 101 100* 101 99* 98*  CO2 25 22 24 23 24 24 24   GLUCOSE 181* 233* 119* 84 114* 218* 329*  BUN 35* 37* 39* 40* 45* 51* 35*  CREATININE 5.71* 5.91* 6.04* 6.17* 6.63* 7.12* 5.22*  ALBUMIN 1.5* 1.4* 1.4* 1.3* 1.4* 1.5* 1.6*  CALCIUM 7.9* 7.8* 7.8* 8.0* 7.9* 8.1* 8.0*  PHOS 4.2 4.5 4.2 4.3 4.8* 4.8* 3.6   Liver Function Tests:  Recent Labs Lab 08/24/14 0600 08/25/14 0521 08/26/14 0656  ALBUMIN 1.4* 1.5* 1.6*   No results for input(s): LIPASE, AMYLASE in the last 168 hours. No results for input(s): AMMONIA in the last 168 hours. CBC:  Recent Labs Lab 08/23/14 0525 08/24/14 0600 08/25/14 0521 08/25/14 1555 08/26/14 0656  WBC 14.1* 14.6* 12.1* 13.2* 12.7*  HGB 6.8* 7.8* 7.7* 8.3* 7.6*  HCT 20.8* 24.3* 23.9* 25.5* 23.9*  MCV 87.0 86.8 87.9 87.9 88.5  PLT 401* 425* 427* 485* 360   Cardiac Enzymes:  Recent Labs Lab 08/22/14 1550  CKTOTAL 95   CBG:  Recent Labs Lab 08/24/14 2107 08/25/14 0821 08/25/14 1132 08/25/14 2132 08/26/14 0736  GLUCAP 145* 206* 139* 247* 329*    Iron Studies: No results for input(s):  IRON, TIBC, TRANSFERRIN, FERRITIN in the last 72 hours. Studies/Results: Dg Chest 2 View  08/25/2014   CLINICAL DATA:  CHEST PAIN AND BACK PAIN WHEN SHE COUGHS. NO FEVER X 24 HRS. NO N/V. NON SMOKER. HTN AND DIAB. PNA.  EXAM: CHEST  2 VIEW  COMPARISON:  08/21/2014  FINDINGS: Moderate bilateral pleural effusions, without significant change when allowing for differences in patient positioning. No evidence of pulmonary edema. There is dependent lower lobe opacity adjacent to the pleural effusions most likely all atelectasis. Pneumonia is not excluded. No pneumothorax.  Cardiac silhouette is normal in size. No mediastinal or hilar masses or evidence of adenopathy.  Right internal jugular dual-lumen central venous catheter is stable with its tip projecting in the right atrium.  IMPRESSION: 1. Moderate bilateral pleural effusions. No pulmonary edema. Dependent lower lobe opacity is likely atelectasis. Pneumonia is not excluded. 2. No significant change from the prior study.   Electronically Signed   By: Lajean Manes M.D.   On: 08/25/2014 21:15   . amLODipine  10 mg Oral Daily  .  ceFAZolin (ANCEF) IV  2 g Intravenous Q24H  . darbepoetin (ARANESP) injection - NON-DIALYSIS  100 mcg Subcutaneous Q Sun-1800  . feeding supplement (NEPRO CARB STEADY)  237  mL Oral BID BM  . furosemide  60 mg Intravenous Q12H  . heparin  5,000 Units Subcutaneous 3 times per day  . insulin aspart  0-5 Units Subcutaneous QHS  . insulin aspart  0-9 Units Subcutaneous TID WC  . insulin glargine  12 Units Subcutaneous QHS  . levothyroxine  100 mcg Oral QAC breakfast  . megestrol  400 mg Oral Daily  . metoprolol tartrate  50 mg Oral BID  . multivitamin with minerals  1 tablet Oral Daily  . OxyCODONE  20 mg Oral Q12H  . polyethylene glycol  17 g Oral Daily  . senna-docusate  2 tablet Oral QHS  . sodium chloride  1,000 mL Intravenous Once  . sodium chloride  3 mL Intravenous Q12H    BMET    Component Value Date/Time   NA 134*  08/26/2014 0656   NA 133* 01/18/2013 1106   K 4.2 08/26/2014 0656   CL 98* 08/26/2014 0656   CO2 24 08/26/2014 0656   GLUCOSE 329* 08/26/2014 0656   GLUCOSE 929* 01/18/2013 1106   BUN 35* 08/26/2014 0656   BUN 37* 01/18/2013 1106   CREATININE 5.22* 08/26/2014 0656   CALCIUM 8.0* 08/26/2014 0656   GFRNONAA 10* 08/26/2014 0656   GFRAA 12* 08/26/2014 0656   CBC    Component Value Date/Time   WBC 12.7* 08/26/2014 0656   WBC 20.0* 01/18/2013 1106   RBC 2.70* 08/26/2014 0656   RBC 2.92* 08/10/2014 1220   RBC 3.70* 01/18/2013 1106   HGB 7.6* 08/26/2014 0656   HCT 23.9* 08/26/2014 0656   PLT 360 08/26/2014 0656   MCV 88.5 08/26/2014 0656   MCH 28.1 08/26/2014 0656   MCH 29.5 01/18/2013 1106   MCHC 31.8 08/26/2014 0656   MCHC 31.9 01/18/2013 1106   RDW 16.0* 08/26/2014 0656   RDW 12.7 01/18/2013 1106   LYMPHSABS 0.9 08/13/2014 0540   LYMPHSABS 1.4 01/18/2013 1106   MONOABS 1.4* 08/13/2014 0540   EOSABS 0.1 08/13/2014 0540   EOSABS 0.0 01/18/2013 1106   BASOSABS 0.1 08/13/2014 0540   BASOSABS 0.0 01/18/2013 1106     Assessment/Plan:  1. AKI/CKD vs. Progressive CKD to stage 5- has advanced CKD at baseline due to poorly controlled DM and HTN further complicated by NSAIDs as well as likely ischemic ATN. Increased UOP. Underwent HD on 7/5 and 08/17/14 and since her last treatment her scr continues to slowly rise.  1. Remains volume overloaded despite good response to IV lasix and albumin. 2. She has had progressive increase in BUN/Cr so s/p another dialysis session on 08/25/14.   3. If Scr continues to climb will consider her ESRD and plan HD again on Saturday and arrange for tunneled HD cath and outpt dialysis. 4. Discussed treatment options including dialysis as well as kidney pancrease transplant and if she is now ESRD will handle all of the referrals from the dialysis unit/SW. 2. Pyomyositis- s/p I&D of right arm as well as right thigh/leg with indwelling drains. On Ancef  for MSSA 3. CKD stage 4-5 due to diabetic nephropathy- pt with h/o poorly controlled type 1 DM now with progressive CKD (Scr almost doubled since December 2015 with baseline of 3 and no labs until June 2016) 1. Discussed need for better DM control 2. Will need referral for kidney pancreas once she stabilizes 4. Hyponatremia- due to #1, improved with HD 5. DM- poorly controlled, management per primary svc 6. Anemia- likely due to CKD s/p Aranesp 08/21/14 and continue with it  weekly.  7. Vascular access- will likely need tunneled HD cath if she does not have return of renal function. Will need to hold off on AVF until pyomyositis has resolved.  [place] A

## 2014-08-26 NOTE — Progress Notes (Signed)
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 Penicillins Hives and Swelling  . Sulfa Antibiotics Hives    Patient Measurements: Height: 5\' 3"  (160 cm) Weight: 182 lb 12.2 oz (82.9 kg) IBW/kg (Calculated) : 52.4 Adjusted Body Weight: n/a  Vital Signs: Temp: 98.8 F (37.1 C) (07/15 0736) Temp Source: Oral (07/15 0736) BP: 126/66 mmHg (07/15 0736) Pulse Rate: 78 (07/15 0736) Intake/Output from previous day: 07/14 0701 - 07/15 0700 In: 480 [P.O.:480] Out: 2650 [Urine:650] Intake/Output from this shift: Total I/O In: 240 [P.O.:240] Out: -   Labs:  Recent Labs  08/24/14 0600 08/25/14 0521 08/25/14 1555 08/26/14 0656  WBC 14.6* 12.1* 13.2* 12.7*  HGB 7.8* 7.7* 8.3* 7.6*  PLT 425* 427* 485* 360  CREATININE 6.63* 7.12*  --  5.22*   Estimated Creatinine Clearance: 16.1 mL/min (by C-G formula based on Cr of 5.22). No results for input(s): VANCOTROUGH, VANCOPEAK, VANCORANDOM, GENTTROUGH, GENTPEAK, GENTRANDOM, TOBRATROUGH, TOBRAPEAK, TOBRARND, AMIKACINPEAK, AMIKACINTROU, AMIKACIN in the last 72 hours.   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   Pt with hx of allergic rxn to PCN: per pt she had urticaria to PCN in the 9th grade, no respiratory distress.  She got cefepime 2 mg x 1 on 01/20/2014 with no reaction.  She has gotten first dose of ancef with no allergic rxn per RN report. S/p I&D on 07/09 New HD pt - ARF on CKD.  Unclear if she has progressed to ESRD. To be assessed daily for HD needs/plans. Had  hemodialysis yesterday x 3hr, BFR 250+.  SCr down today to 5.22 << 7.12. Estimated CrCl ~ 16 ml/min. Urine occurrence 1x Afebrile, WBC slowly trending down 12.7 <13.2< 12.1.. ID following. Needs several more wks of tx. Ortho surgeon also following for wound. Looking good. Sutures to come out later this week or over the weekend. Still has a drain in leg  7/3 debridement in OR 7/6 R to biopsy and aspirate muscle of R thigh  vanc 6/26 >>7/6    7/5 VR prior to pt's first HD = 29 mcg/ml (goal typically 15-25 mcg/ml prior to HD).  aztreonam 6/28 >>7/4 clinda 7/4 >>7/5 ancef 7/6 >>  6/28 urine cx: >100,000 lactobacillus F 6/28 blood cx: neg 7/3: Tissue/Abscess x 2: MSSA 7/6 right thigh abscess - abundant Staph aureus- MSSA 7/6 right thigh abscess anaerobic - negative /final 7/12 7/9 thigh tissue: few Staph aureus - pan-sensitive 7/9 deep thigh muscle anaerobic - negative [place] (7/14)  Plan: -Ancef 2 gm IV q24, give after HD on HD days if HD for now. If no HD and renal function improves, will need to reassess dosing -f/u for HD sessions / changes  Thank you for allowing pharmacy to be part of this patients care team. Nicole Cella, RPh Clinical Pharmacist Pager: [phone]  08/26/2014 12:26 PM

## 2014-08-26 NOTE — Progress Notes (Signed)
Late entry for 08/25/14:  Patient ambulated in the hall way without oxygen, she desaturated to 69%, with 3L O2 [place] helped her sats back to 95%.

## 2014-08-26 NOTE — Progress Notes (Signed)
Inpatient Diabetes Program Recommendations  AACE/ADA: New Consensus Statement on Inpatient Glycemic Control (2013)  Target Ranges:  Prepandial:   less than 140 mg/dL      Peak postprandial:   less than 180 mg/dL (1-2 hours)      Critically ill patients:  140 - 180 mg/dL   Results for Tracy [name], Tracy [name] (MRN [id number]) as of 08/26/2014 11:12  Ref. Range 08/25/2014 08:21 08/25/2014 11:32 08/25/2014 21:32 08/26/2014 07:36  Glucose-Capillary Latest Ref Range: 65-99 mg/dL 206 (H) 139 (H) 247 (H) 329 (H)    Diabetes history: DM1 Current orders for Inpatient glycemic control: Lantus 12 units QHS, Novolog 0-9 units TID with meals  Inpatient Diabetes Program Recommendations Insulin - Meal Coverage: According to the chart patient ate 75% of breakfast and also has Nepro supplements BID between meals ordered (which have approximately 37 grams of carbohydrates in 237 ml). Patient has type 1 diabetes and will require insulin for carbohydrate coverage. Please consider ordering Novolog 0-6 units TID with meals (1 unit for every 10 grams of carbohydrates).  Thanks, Barnie Alderman, RN, MSN, CCRN, CDE Diabetes Coordinator Inpatient Diabetes Program [phone] (Team Pager from [place] to [place]) [phone] ([facility] office) [phone] [facility] office) [phone] [facility] office)

## 2014-08-26 NOTE — Progress Notes (Signed)
PT Cancellation Note  Patient Details Name: Tracy [name] MRN: [id number] DOB: [birth year]   Cancelled Treatment:    Reason Eval/Treat Not Completed: Other (comment).  Patient was ambulating with mother as PT entered room.  Patient reports she has been ambulating 3x/day with family/nursing.  Patient reports "I am doing fine".  No further PT needs - PT will sign off.  Encouraged patient to continue ambulation with supervision.   Despina Pole 08/26/2014, 4:01 PM Carita Pian. Sanjuana Kava, [place] Pager [phone]

## 2014-08-27 DIAGNOSIS — M609 Myositis, unspecified: Secondary | ICD-10-CM

## 2014-08-27 LAB — GLUCOSE, CAPILLARY
GLUCOSE-CAPILLARY: 373 mg/dL — AB (ref 65–99)
Glucose-Capillary: 304 mg/dL — ABNORMAL HIGH (ref 65–99)
Glucose-Capillary: 358 mg/dL — ABNORMAL HIGH (ref 65–99)
Glucose-Capillary: 333 mg/dL — ABNORMAL HIGH (ref 65–99)
Glucose-Capillary: 271 mg/dL — ABNORMAL HIGH (ref 65–99)

## 2014-08-27 LAB — CBC
HCT: 24.3 % — ABNORMAL LOW (ref 36.0–46.0)
Hemoglobin: 7.8 g/dL — ABNORMAL LOW (ref 12.0–15.0)
MCH: 28.7 pg (ref 26.0–34.0)
MCHC: 32.1 g/dL (ref 30.0–36.0)
MCV: 89.3 fL (ref 78.0–100.0)
PLATELETS: 399 10*3/uL (ref 150–400)
RBC: 2.72 MIL/uL — ABNORMAL LOW (ref 3.87–5.11)
RDW: 16.2 % — AB (ref 11.5–15.5)
WBC: 13.8 10*3/uL — ABNORMAL HIGH (ref 4.0–10.5)

## 2014-08-27 LAB — RENAL FUNCTION PANEL
ALBUMIN: 1.6 g/dL — AB (ref 3.5–5.0)
ANION GAP: 11 (ref 5–15)
BUN: 39 mg/dL — ABNORMAL HIGH (ref 6–20)
CHLORIDE: 97 mmol/L — AB (ref 101–111)
CO2: 25 mmol/L (ref 22–32)
Calcium: 8.1 mg/dL — ABNORMAL LOW (ref 8.9–10.3)
Creatinine, Ser: 5.96 mg/dL — ABNORMAL HIGH (ref 0.44–1.00)
GFR calc Af Amer: 10 mL/min — ABNORMAL LOW (ref >60)
GFR, EST NON AFRICAN AMERICAN: 9 mL/min — AB (ref >60)
Glucose, Bld: 401 mg/dL — ABNORMAL HIGH (ref 65–99)
POTASSIUM: 4.3 mmol/L (ref 3.5–5.1)
Phosphorus: 3.7 mg/dL (ref 2.5–4.6)
SODIUM: 133 mmol/L — AB (ref 135–145)

## 2014-08-27 MED ORDER — INSULIN ASPART 100 UNIT/ML ~~LOC~~ SOLN
0.0000 [IU] | Freq: Every day | SUBCUTANEOUS | Status: DC
  Administered 2014-08-27: 3 [IU] via SUBCUTANEOUS
  Administered 2014-08-28: 4 [IU] via SUBCUTANEOUS

## 2014-08-27 MED ORDER — INSULIN ASPART 100 UNIT/ML ~~LOC~~ SOLN
0.0000 [IU] | Freq: Three times a day (TID) | SUBCUTANEOUS | Status: DC
Start: 2014-08-27 — End: 2014-08-29
  Administered 2014-08-27: 11 [IU] via SUBCUTANEOUS
  Administered 2014-08-28: 5 [IU] via SUBCUTANEOUS
  Administered 2014-08-28: 8 [IU] via SUBCUTANEOUS
  Administered 2014-08-28: 11 [IU] via SUBCUTANEOUS
  Administered 2014-08-29: 15 [IU] via SUBCUTANEOUS
  Administered 2014-08-29: 5 [IU] via SUBCUTANEOUS

## 2014-08-27 NOTE — Progress Notes (Signed)
Patient ID: Tracy [name], female   DOB: 01-21-1986, 29 y.o.   MRN: [id number] S:had trouble sleeping last night O:BP 130/69 mmHg  Pulse 74  Temp(Src) 98.4 F (36.9 C) (Oral)  Resp 16  Ht 5\' 3"  (1.6 m)  Wt 87 kg (191 lb 12.8 oz)  BMI 33.98 kg/m2  SpO2 97%  LMP 07/18/2014  Intake/Output Summary (Last 24 hours) at 08/27/14 1132 Last data filed at 08/27/14 0841  Gross per 24 hour  Intake    360 ml  Output   1700 ml  Net  -1340 ml   Intake/Output: I/O last 3 completed shifts: In: 53 [P.O.:720] Out: 1700 [Urine:1700]  Intake/Output this shift:  Total I/O In: 120 [P.O.:120] Out: -  Weight change: 1.4 kg (3 lb 1.4 oz) Gen:WD WN WF in NAD CVS:no rub Resp:cta [id number] Ext:+edema   Recent Labs Lab 08/21/14 0500 08/22/14 0528 08/23/14 0525 08/24/14 0600 08/25/14 0521 08/26/14 0656 08/27/14 0555  NA 134* 135 134* 135 135 134* 133*  K 3.9 3.6 3.6 4.5 3.7 4.2 4.3  CL 100* 101 100* 101 99* 98* 97*  CO2 22 24 23 24 24 24 25   GLUCOSE 233* 119* 84 114* 218* 329* 401*  BUN 37* 39* 40* 45* 51* 35* 39*  CREATININE 5.91* 6.04* 6.17* 6.63* 7.12* 5.22* 5.96*  ALBUMIN 1.4* 1.4* 1.3* 1.4* 1.5* 1.6* 1.6*  CALCIUM 7.8* 7.8* 8.0* 7.9* 8.1* 8.0* 8.1*  PHOS 4.5 4.2 4.3 4.8* 4.8* 3.6 3.7   Liver Function Tests:  Recent Labs Lab 08/25/14 0521 08/26/14 0656 08/27/14 0555  ALBUMIN 1.5* 1.6* 1.6*   No results for input(s): LIPASE, AMYLASE in the last 168 hours. No results for input(s): AMMONIA in the last 168 hours. CBC:  Recent Labs Lab 08/24/14 0600 08/25/14 0521 08/25/14 1555 08/26/14 0656 08/27/14 0555  WBC 14.6* 12.1* 13.2* 12.7* 13.8*  HGB 7.8* 7.7* 8.3* 7.6* 7.8*  HCT 24.3* 23.9* 25.5* 23.9* 24.3*  MCV 86.8 87.9 87.9 88.5 89.3  PLT 425* 427* 485* 360 399   Cardiac Enzymes:  Recent Labs Lab 08/22/14 1550  CKTOTAL 95   CBG:  Recent Labs Lab 08/26/14 0736 08/26/14 1135 08/26/14 1551 08/26/14 2144 08/27/14 0712  GLUCAP 329* 184* 195* 304* 358*     Iron Studies: No results for input(s): IRON, TIBC, TRANSFERRIN, FERRITIN in the last 72 hours. Studies/Results: Dg Chest 2 View  08/25/2014   CLINICAL DATA:  CHEST PAIN AND BACK PAIN WHEN SHE COUGHS. NO FEVER X 24 HRS. NO N/V. NON SMOKER. HTN AND DIAB. PNA.  EXAM: CHEST  2 VIEW  COMPARISON:  08/21/2014  FINDINGS: Moderate bilateral pleural effusions, without significant change when allowing for differences in patient positioning. No evidence of pulmonary edema. There is dependent lower lobe opacity adjacent to the pleural effusions most likely all atelectasis. Pneumonia is not excluded. No pneumothorax.  Cardiac silhouette is normal in size. No mediastinal or hilar masses or evidence of adenopathy.  Right internal jugular dual-lumen central venous catheter is stable with its tip projecting in the right atrium.  IMPRESSION: 1. Moderate bilateral pleural effusions. No pulmonary edema. Dependent lower lobe opacity is likely atelectasis. Pneumonia is not excluded. 2. No significant change from the prior study.   Electronically Signed   By: Lajean Manes M.D.   On: 08/25/2014 21:15   . amLODipine  10 mg Oral Daily  .  ceFAZolin (ANCEF) IV  2 g Intravenous Q24H  . darbepoetin (ARANESP) injection - NON-DIALYSIS  100 mcg Subcutaneous Q Sun-1800  .  feeding supplement (NEPRO CARB STEADY)  237 mL Oral BID BM  . furosemide  60 mg Intravenous Q12H  . heparin  5,000 Units Subcutaneous 3 times per day  . insulin aspart  0-5 Units Subcutaneous QHS  . insulin aspart  0-6 Units Subcutaneous TID WC  . insulin glargine  12 Units Subcutaneous QHS  . levothyroxine  100 mcg Oral QAC breakfast  . megestrol  400 mg Oral Daily  . metoprolol tartrate  50 mg Oral BID  . multivitamin with minerals  1 tablet Oral Daily  . OxyCODONE  20 mg Oral Q12H  . polyethylene glycol  17 g Oral Daily  . senna-docusate  2 tablet Oral QHS  . sodium chloride  1,000 mL Intravenous Once  . sodium chloride  3 mL Intravenous Q12H     BMET    Component Value Date/Time   NA 133* 08/27/2014 0555   NA 133* 01/18/2013 1106   K 4.3 08/27/2014 0555   CL 97* 08/27/2014 0555   CO2 25 08/27/2014 0555   GLUCOSE 401* 08/27/2014 0555   GLUCOSE 929* 01/18/2013 1106   BUN 39* 08/27/2014 0555   BUN 37* 01/18/2013 1106   CREATININE 5.96* 08/27/2014 0555   CALCIUM 8.1* 08/27/2014 0555   GFRNONAA 9* 08/27/2014 0555   GFRAA 10* 08/27/2014 0555   CBC    Component Value Date/Time   WBC 13.8* 08/27/2014 0555   WBC 20.0* 01/18/2013 1106   RBC 2.72* 08/27/2014 0555   RBC 2.92* 08/10/2014 1220   RBC 3.70* 01/18/2013 1106   HGB 7.8* 08/27/2014 0555   HCT 24.3* 08/27/2014 0555   PLT 399 08/27/2014 0555   MCV 89.3 08/27/2014 0555   MCH 28.7 08/27/2014 0555   MCH 29.5 01/18/2013 1106   MCHC 32.1 08/27/2014 0555   MCHC 31.9 01/18/2013 1106   RDW 16.2* 08/27/2014 0555   RDW 12.7 01/18/2013 1106   LYMPHSABS 0.9 08/13/2014 0540   LYMPHSABS 1.4 01/18/2013 1106   MONOABS 1.4* 08/13/2014 0540   EOSABS 0.1 08/13/2014 0540   EOSABS 0.0 01/18/2013 1106   BASOSABS 0.1 08/13/2014 0540   BASOSABS 0.0 01/18/2013 1106    Assessment/Plan:  1. AKI/CKD vs. Progressive CKD to stage 5- has advanced CKD at baseline due to poorly controlled DM and HTN further complicated by NSAIDs as well as likely ischemic ATN. Increased UOP. Underwent HD on 7/5 and 08/17/14 and since her last treatment her scr continues to slowly rise.  1. Remains volume overloaded despite good response to IV lasix and albumin. 2. She has had progressive increase in BUN/Cr so s/p another dialysis session on 08/25/14.  3. If Scr continues to climb will consider her ESRD and plan HD again on Monday and arrange for tunneled HD cath and outpt dialysis. 4. Discussed treatment options including dialysis as well as kidney pancreas transplant and if she is now ESRD will handle all of the referrals from the dialysis unit/SW. 2. Pyomyositis- s/p I&D of right arm as well as  right thigh/leg with indwelling drains. On Ancef for MSSA 3. CKD stage 4-5 due to diabetic nephropathy- pt with h/o poorly controlled type 1 DM now with progressive CKD (Scr almost doubled since December 2015 with baseline of 3 and no labs until June 2016) 1. Discussed need for better DM control 2. Will need referral for kidney pancreas once she stabilizes 4. Hyponatremia- due to #1, improved with HD 5. DM- poorly controlled, management per primary svc 6. Anemia- likely due to CKD s/p Aranesp  08/21/14 and continue with it weekly.  7. Vascular access- will likely need tunneled HD cath if she does not have return of renal function. Will need to hold off on AVF until pyomyositis has resolved. 1. Will d/c trialysis catheter today and if Scr continues to climb, will need HD cath placed on Monday Clarity Ciszek A

## 2014-08-27 NOTE — Progress Notes (Signed)
Patient ID: Tracy [name], female   DOB: Jun 25, 1985, 29 y.o.   MRN: [id number] Patient is doing quite well today. I have reviewed her exam at length.  Right arm has sutures removed. Nursing staff will apply Steri-Strips.  She remains neurovascularly intact. There is no evidence of worsening and she continues to progress along nicely each day.  I discussed her all issues.  Would recommend continue aggressive range of motion passive stretch and active range of motion as well as active assisted motion.  I discussed her all issues.  We'll continue to watch her while she is in-house. I do feel that she would benefit from outpatient physical therapy once discharged to restore full motion and keep the muscles stretched out  Excellent improvement regarding her right arm  Jayln Madeira M.D.

## 2014-08-27 NOTE — Progress Notes (Signed)
Patient ID: Tracy [name], female   DOB: [birth year], 29 y.o.   MRN: [id number]  [name] PROGRESS NOTE  Tracy [name] [id number] DOB: Jan 02, 1986 DOA: 08/09/2014 PCP: [name]   Brief narrative:    29 year old Caucasian female with history of diabetes mellitus on insulin pump but poor control, hypothyroidism poor compliance she stopped taking her Synthroid 2 months ago due to insurance issues and medication affordability, chronic kidney disease stage IV baseline creatinine close to 3, who was admitted to the hospital with 2 week history of right arm and right leg pain, upon further workup with MRI was found that she had extensive myositis in both right-sided extremities. She was initially seen at [facility] in [place] and then transferred to [facility].  She was seen here by hand surgery, general surgery, orthopedic surgery, renal, infectious disease, phone consultation with rheumatologist Dr. Amil Amen. Initially no clear reason of myositis could be found, however since her clinical course did not show any improvement she was taken to the OR first by Dr. Amedeo Plenty, right arm fluid was drained which was suggestive of infectious myositis, she has been on IV antibiotics throughout her hospital stay. On 08/20/2014 she returned to the OR again by orthopedic surgery for drainage of right leg fluid collection. Her workup certainly suggests infectious myositis however reason for her bacteremia is not clear at all. ID is on board as well.  Note due to her illness patient's has progressed from chronic kidney disease stage IV. She received dialysis on 7/5 and 7/6, but not since then. Renal to decide whether this is ESRD or not. In addition, nursing noted on 7/10 morning that patient was hypoxic off of oxygen and requiring 3 L to stay above 90% with mild nonproductive cough and some low-grade temperatures. Chest x-ray done only noted mild pleural effusions but no infiltrate.  attempts have been made to aggressively diurese her and she's only had a moderate response. Her BUN and creatinine have subsequently risen and nephrology made decision to dialyze her again 7/14.   On 7/12, hemoglobin dropped below 7 so blood transfusion provided, patient herself doing better and  Wants to go home.  Assessment/Plan:    Mouth ulcer - Cont with PRN Magic mouthwash with lidocaine - improving  Myositis of the right arm, right thigh with MSSA - Status post right arm muscle biopsy along with fluid drainage and JP drain as well as right leg ET guided drainage - Source of bacteremia remains unclear. Echocardiogram was normal.  - per hand surgeon, recommend to continue aggressive ROM passive stretch and active ROM, active assisted motion. - will need outpatient OT - WBC count slowly improving  - per ID continue 2 more weeks of ABX, this will give pt toatl of 4 weeks for MSSA soft tissue infection - can switch to oral cephalexin renally once ready for d/c until 09/09/2014 - ID team has singed off 7/15 and the assistance greatly appreciated   Active Problems:  Proliferative diabetic retinopathy - in the setting of poorly controlled DM - close outpatient follow up   Hypothyroidism - continue synthroid    Normocytic anemia from CKD and uncontrolled DM - iron deficiency and also in part from renal failure and chronic disease - Status post 2 units packed red blood cells transfusion on 7/6 and 7/12 - Hg stable in the past 24 hours  - also received Aranesp 7/10, continue once weekly    ARF superimposed on stage 4 CKD, now ESRD, secondary to  diabetic nephropathy  - Previous creatinine baseline at 3.  - Possibly from hypovolemia from nausea and vomiting plus NSAID use prior to admission causing ATN.  - nephrology following  - if Cr continues trending up, will be considered ESRD & plan HD again on Monday, arrange for tunneled HD cath, outpt HD    Acute respiratory failure  with hypoxia secondary to volume overload  - Nursing noted that patient required at least 3 L of oxygen recently - Chest x-ray with no signs of infiltrate, but did note mild pleural effusions leading [place] to start diuresing her aggressively.  - Given no fever and white count and pt improving, no further broader ABX coverage instituted    DM I (diabetes mellitus, type I), uncontrolled with renal disease - Poor outpatient control. A1c of 8.9. Currently on Lantus plus sliding scale - appreciate diabetic educator assistance    Essential hypertension - continue Metoprolol, Norvasc, Lasix     Hyponatremia - from renal failure - improving with HD    Pain control - denies pain this AM - provide analgesia as needed  DVT prophylaxis - Heparin SQ  Code Status: Full.  Family Communication:  plan of care discussed with the patient Disposition Plan: Home when stable.   IV access:  Peripheral IV  Procedures and diagnostic studies:    7/03 Right arm muscle biopsy plus fluid drainage and JP drain placement of right arm  7/04 Right IJ dialysis catheter by interventional radiology  7/05 Dialysis session  7/06 Right leg ultrasound guided aspiration by interventional radiology  7/09 Leg expiration with drainage of fluid  7/06 and 7/12 Transfusion 2 units packed red blood cells  7/12 MR Humerus right WO contrast extensive myositis primarily involving the triceps and coracobrachialis muscles 7/12 MR Femur Right Wo Contrast severe muscle edema within the vastus medialis, vastus intermedius and sartorius  7/14/ CXR Moderate bilateral pleural effusions unchanged from 7/10  Medical Consultants:  Case discussed with rheumatology by phone. Interventional radiology. Infectious disease. Nephrology General surgery Hand surgery Orthopedic surgery  Other Consultants:  None   IAnti-Infectives:   IV vancomycin 6/28-7/5 IV Azactam 6/28-7/4 IV clindamycin 7/4-7/5 IV Ancef  7/6-present  Faye Ramsay, MD  [facility] Pager [phone]  If 7PM-7AM, please contact night-coverage [email or web address] Password [facility] 08/27/2014, 3:48 PM   LOS: 18 days   HPI/Subjective: No events overnight.   Objective: Filed Vitals:   08/26/14 1552 08/26/14 2140 08/27/14 0512 08/27/14 0840  BP: 115/68 118/65 126/62 130/69  Pulse: 72 81 76 74  Temp: 98.4 F (36.9 C) 98.9 F (37.2 C) 98.5 F (36.9 C) 98.4 F (36.9 C)  TempSrc: Oral   Oral  Resp: 17 19 17 16   Height:      Weight:  87 kg (191 lb 12.8 oz)    SpO2: 94% 91% 92% 97%    Intake/Output Summary (Last 24 hours) at 08/27/14 1548 Last data filed at 08/27/14 1312  Gross per 24 hour  Intake    240 ml  Output   2650 ml  Net  -2410 ml    Exam:   General:  Pt is alert, follows commands appropriately, not in acute distress  Cardiovascular: Regular rate and rhythm, S1/S2, no murmurs, no rubs, no gallops  Respiratory: Clear to auscultation bilaterally, no wheezing, no crackles, no rhonchi  Abdomen: Soft, non tender, non distended, bowel sounds present, no guarding  Extremities: RUE still with edema   Data Reviewed: Basic Metabolic Panel:  Recent Labs Lab 08/23/14 0525 08/24/14 0600  08/25/14 0521 08/26/14 0656 08/27/14 0555  NA 134* 135 135 134* 133*  K 3.6 4.5 3.7 4.2 4.3  CL 100* 101 99* 98* 97*  CO2 23 24 24 24 25   GLUCOSE 84 114* 218* 329* 401*  BUN 40* 45* 51* 35* 39*  CREATININE 6.17* 6.63* 7.12* 5.22* 5.96*  CALCIUM 8.0* 7.9* 8.1* 8.0* 8.1*  PHOS 4.3 4.8* 4.8* 3.6 3.7   Liver Function Tests:  Recent Labs Lab 08/23/14 0525 08/24/14 0600 08/25/14 0521 08/26/14 0656 08/27/14 0555  ALBUMIN 1.3* 1.4* 1.5* 1.6* 1.6*   CBC:  Recent Labs Lab 08/24/14 0600 08/25/14 0521 08/25/14 1555 08/26/14 0656 08/27/14 0555  WBC 14.6* 12.1* 13.2* 12.7* 13.8*  HGB 7.8* 7.7* 8.3* 7.6* 7.8*  HCT 24.3* 23.9* 25.5* 23.9* 24.3*  MCV 86.8 87.9 87.9 88.5 89.3  PLT 425* 427* 485* 360 399   Cardiac  Enzymes:  Recent Labs Lab 08/22/14 1550  CKTOTAL 95   CBG:  Recent Labs Lab 08/26/14 1135 08/26/14 1551 08/26/14 2144 08/27/14 0712 08/27/14 1151  GLUCAP 184* 195* 304* 358* 373*    Recent Results (from the past 240 hour(s))  Anaerobic culture     Status: None   Collection Time: 08/20/14 10:40 AM  Result Value Ref Range Status   Specimen Description TISSUE  Final   Special Requests DEEP THIGH MUSCLE POF ANCEF CUP A  Final   Gram Stain   Final    ABUNDANT WBC PRESENT,BOTH PMN AND MONONUCLEAR NO SQUAMOUS EPITHELIAL CELLS SEEN RARE GRAM POSITIVE COCCI IN CLUSTERS Performed at [facility]    Culture   Final    NO ANAEROBES ISOLATED Performed at [facility]    Report Status 08/25/2014 FINAL  Final  Tissue culture     Status: None   Collection Time: 08/20/14 10:40 AM  Result Value Ref Range Status   Specimen Description TISSUE THIGH  Final   Special Requests POF ON ANCEF  Final   Gram Stain   Final    MODERATE WBC PRESENT,BOTH PMN AND MONONUCLEAR NO SQUAMOUS EPITHELIAL CELLS SEEN FEW GRAM POSITIVE COCCI IN CLUSTERS Performed at [facility]    Culture   Final    FEW STAPHYLOCOCCUS AUREUS Note: RIFAMPIN AND GENTAMICIN SHOULD NOT BE USED AS SINGLE DRUGS FOR TREATMENT OF STAPH INFECTIONS. Performed at [facility]    Report Status 08/23/2014 FINAL  Final   Organism ID, Bacteria STAPHYLOCOCCUS AUREUS  Final      Susceptibility   Staphylococcus aureus - MIC*    CLINDAMYCIN <=0.25 SENSITIVE Sensitive     ERYTHROMYCIN <=0.25 SENSITIVE Sensitive     GENTAMICIN <=0.5 SENSITIVE Sensitive     LEVOFLOXACIN 0.25 SENSITIVE Sensitive     OXACILLIN <=0.25 SENSITIVE Sensitive     PENICILLIN 0.12 SENSITIVE Sensitive     RIFAMPIN <=0.5 SENSITIVE Sensitive     TRIMETH/SULFA <=10 SENSITIVE Sensitive     VANCOMYCIN 1 SENSITIVE Sensitive     TETRACYCLINE <=1 SENSITIVE Sensitive     MOXIFLOXACIN <=0.25 SENSITIVE Sensitive     * FEW  STAPHYLOCOCCUS AUREUS     Scheduled Meds: . amLODipine  10 mg Oral Daily  .  ceFAZolin (ANCEF) IV  2 g Intravenous Q24H  . darbepoetin (ARANESP) injection - NON-DIALYSIS  100 mcg Subcutaneous Q Sun-1800  . feeding supplement (NEPRO CARB STEADY)  237 mL Oral BID BM  . furosemide  60 mg Intravenous Q12H  . heparin  5,000 Units Subcutaneous 3 times per day  . insulin aspart  0-5 Units Subcutaneous QHS  . insulin aspart  0-6 Units Subcutaneous TID WC  . insulin glargine  12 Units Subcutaneous QHS  . levothyroxine  100 mcg Oral QAC breakfast  . megestrol  400 mg Oral Daily  . metoprolol tartrate  50 mg Oral BID  . multivitamin with minerals  1 tablet Oral Daily  . OxyCODONE  20 mg Oral Q12H  . polyethylene glycol  17 g Oral Daily  . senna-docusate  2 tablet Oral QHS  . sodium chloride  1,000 mL Intravenous Once  . sodium chloride  3 mL Intravenous Q12H   Continuous Infusions: . sodium chloride

## 2014-08-27 NOTE — Progress Notes (Signed)
MD removed stitches from right upper arm.  Steri strips placed.

## 2014-08-28 ENCOUNTER — Inpatient Hospital Stay (HOSPITAL_COMMUNITY): Payer: BLUE CROSS/BLUE SHIELD

## 2014-08-28 LAB — GLUCOSE, CAPILLARY
GLUCOSE-CAPILLARY: 331 mg/dL — AB (ref 65–99)
GLUCOSE-CAPILLARY: 205 mg/dL — AB (ref 65–99)
GLUCOSE-CAPILLARY: 298 mg/dL — AB (ref 65–99)
Glucose-Capillary: 350 mg/dL — ABNORMAL HIGH (ref 65–99)

## 2014-08-28 LAB — CBC
HCT: 24.8 % — ABNORMAL LOW (ref 36.0–46.0)
Hemoglobin: 7.9 g/dL — ABNORMAL LOW (ref 12.0–15.0)
MCH: 28.5 pg (ref 26.0–34.0)
MCHC: 31.9 g/dL (ref 30.0–36.0)
MCV: 89.5 fL (ref 78.0–100.0)
Platelets: 396 10*3/uL (ref 150–400)
RBC: 2.77 MIL/uL — AB (ref 3.87–5.11)
RDW: 16.4 % — ABNORMAL HIGH (ref 11.5–15.5)
WBC: 15.2 10*3/uL — AB (ref 4.0–10.5)

## 2014-08-28 LAB — RENAL FUNCTION PANEL
Albumin: 1.5 g/dL — ABNORMAL LOW (ref 3.5–5.0)
Anion gap: 8 (ref 5–15)
BUN: 42 mg/dL — ABNORMAL HIGH (ref 6–20)
CO2: 27 mmol/L (ref 22–32)
CREATININE: 5.85 mg/dL — AB (ref 0.44–1.00)
Calcium: 8.5 mg/dL — ABNORMAL LOW (ref 8.9–10.3)
Chloride: 98 mmol/L — ABNORMAL LOW (ref 101–111)
GFR calc Af Amer: 10 mL/min — ABNORMAL LOW (ref >60)
GFR, EST NON AFRICAN AMERICAN: 9 mL/min — AB (ref >60)
GLUCOSE: 340 mg/dL — AB (ref 65–99)
Phosphorus: 3.7 mg/dL (ref 2.5–4.6)
Potassium: 4.4 mmol/L (ref 3.5–5.1)
Sodium: 133 mmol/L — ABNORMAL LOW (ref 135–145)

## 2014-08-28 MED ORDER — NYSTATIN 100000 UNIT/GM EX POWD
Freq: Three times a day (TID) | CUTANEOUS | Status: DC
  Administered 2014-08-28: 18:00:00 via TOPICAL
  Administered 2014-08-28: 1 g via TOPICAL
  Administered 2014-08-29 – 2014-09-01 (×10): via TOPICAL
  Filled 2014-08-28: qty 15

## 2014-08-28 NOTE — Progress Notes (Signed)
Orthopedic Tech Progress Note Patient Details:  Tracy [name] [birth year] [id number] Applied Unna boot to RLE.  Pulses, sensation, motion intact before and after application.  Capillary refill less than 2 seconds before and after application. Ortho Devices Type of Ortho Device: Haematologist Ortho Device/Splint Location: RLE Ortho Device/Splint Interventions: Application   Darrol Poke 08/28/2014, 10:10 PM

## 2014-08-28 NOTE — Progress Notes (Signed)
Patient ID: Tracy [name], female   DOB: 14-Aug-1985, 29 y.o.   MRN: [id number]  [name] PROGRESS NOTE  Tracy [name] [id number] DOB: 1985-05-05 DOA: 08/09/2014 PCP: [name]   Brief narrative:    29 year old Caucasian female with history of diabetes mellitus on insulin pump but poor control, hypothyroidism poor compliance she stopped taking her Synthroid 2 months ago due to insurance issues and medication affordability, chronic kidney disease stage IV baseline creatinine close to 3, who was admitted to the hospital with 2 week history of right arm and right leg pain, upon further workup with MRI was found that she had extensive myositis in both right-sided extremities. She was initially seen at [facility] in [place] and then transferred to [facility].  She was seen here by hand surgery, general surgery, orthopedic surgery, renal, infectious disease, phone consultation with rheumatologist Dr. Amil Amen. Initially no clear reason of myositis could be found, however since her clinical course did not show any improvement she was taken to the OR first by Dr. Amedeo Plenty, right arm fluid was drained which was suggestive of infectious myositis, she has been on IV antibiotics throughout her hospital stay. On 08/20/2014 she returned to the OR again by orthopedic surgery for drainage of right leg fluid collection. Her workup certainly suggests infectious myositis however reason for her bacteremia is not clear at all. ID is on board as well.  Note due to her illness patient's has progressed from chronic kidney disease stage IV. She received dialysis on 7/5 and 7/6, but not since then. Renal to decide whether this is ESRD or not. In addition, nursing noted on 7/10 morning that patient was hypoxic off of oxygen and requiring 3 L to stay above 90% with mild nonproductive cough and some low-grade temperatures. Chest x-ray done only noted mild pleural effusions but no infiltrate.  attempts have been made to aggressively diurese her and she's only had a moderate response. Her BUN and creatinine have subsequently risen and nephrology made decision to dialyze her again 7/14.   On 7/12, hemoglobin dropped below 7 so blood transfusion provided, patient herself doing better and  Wants to go home.  Assessment/Plan:    Mouth ulcer - Cont with PRN Magic mouthwash with lidocaine - improving and almost resolved   Myositis of the right arm, right thigh with MSSA - Status post right arm muscle biopsy along with fluid drainage and JP drain as well as right leg ET guided drainage - Source of bacteremia remains unclear. Echocardiogram was normal.  - per hand surgeon, recommend to continue aggressive ROM passive stretch and active ROM, active assisted motion. - will need outpatient OT - WBC trending up, unclear why - per ID continue 2 more weeks of ABX, this will give pt toatl of 4 weeks for MSSA soft tissue infection - can switch to oral cephalexin orally once ready for d/c until 09/09/2014 - ID team has singed off 7/15 and the assistance greatly appreciated   Active Problems:  Proliferative diabetic retinopathy - in the setting of poorly controlled DM - close outpatient follow up   Hypothyroidism - continue synthroid    Normocytic anemia from CKD and uncontrolled DM - iron deficiency and also in part from renal failure and chronic disease - Status post 2 units packed red blood cells transfusion on 7/6 and 7/12 - Hg stable in the past 48 hours  - also received Aranesp 7/10, continue once weekly    ARF superimposed on stage 4 CKD,  now ESRD, secondary to diabetic nephropathy  - Previous creatinine baseline at 3.  - Possibly from hypovolemia from nausea and vomiting plus NSAID use prior to admission causing ATN.  - nephrology following  - if Cr continues trending up, will be considered ESRD & plan HD again on Monday, arrange for tunneled HD cath, outpt HD    Acute  respiratory failure with hypoxia secondary to volume overload  - Nursing noted that patient required at least 3 L of oxygen recently - Chest x-ray with no signs of infiltrate, but did note mild pleural effusions leading [place] to start diuresing her aggressively.  - pt with no fever but WBC up this AM, will repeat CXR 2 view    DM I (diabetes mellitus, type I), uncontrolled with renal disease - Poor outpatient control. A1c of 8.9. Currently on Lantus plus sliding scale - appreciate diabetic educator assistance    Essential hypertension - continue Metoprolol, Norvasc, Lasix     Hyponatremia - from renal failure - improving with HD    Pain control - denies pain this AM - provide analgesia as needed  DVT prophylaxis - Heparin SQ  Code Status: Full.  Family Communication:  plan of care discussed with the patient Disposition Plan: Home when stable.   IV access:  Peripheral IV  Procedures and diagnostic studies:    7/03 Right arm muscle biopsy plus fluid drainage and JP drain placement of right arm  7/04 Right IJ dialysis catheter by interventional radiology  7/05 Dialysis session  7/06 Right leg ultrasound guided aspiration by interventional radiology  7/09 Leg expiration with drainage of fluid  7/06 and 7/12 Transfusion 2 units packed red blood cells  7/12 MR Humerus right WO contrast extensive myositis primarily involving the triceps and coracobrachialis muscles 7/12 MR Femur Right Wo Contrast severe muscle edema within the vastus medialis, vastus intermedius and sartorius  7/14/ CXR Moderate bilateral pleural effusions unchanged from 7/10  Medical Consultants:  Case discussed with rheumatology by phone. Interventional radiology. Infectious disease. Nephrology General surgery Hand surgery Orthopedic surgery  Other Consultants:  None   IAnti-Infectives:   IV vancomycin 6/28-7/5 IV Azactam 6/28-7/4 IV clindamycin 7/4-7/5 IV Ancef 7/6-present  Faye Ramsay,  MD  [facility] Pager [phone]  If 7PM-7AM, please contact night-coverage [email or web address] Password [facility] 08/28/2014, 12:44 PM   LOS: 19 days   HPI/Subjective: No events overnight.   Objective: Filed Vitals:   08/27/14 2110 08/28/14 0500 08/28/14 0524 08/28/14 0949  BP: 127/63  130/67 121/65  Pulse: 78  78 81  Temp: 98.5 F (36.9 C)  98.2 F (36.8 C) 98.5 F (36.9 C)  TempSrc: Oral  Oral Oral  Resp: 17  18 18   Height:      Weight:  86.94 kg (191 lb 10.7 oz)    SpO2: 95%  96% 94%    Intake/Output Summary (Last 24 hours) at 08/28/14 1244 Last data filed at 08/28/14 [id number]  Gross per 24 hour  Intake   1620 ml  Output   2000 ml  Net   -380 ml    Exam:   General:  Pt is alert, follows commands appropriately, not in acute distress  Cardiovascular: Regular rate and rhythm, S1/S2, no murmurs, no rubs, no gallops  Respiratory: Clear to auscultation bilaterally, no wheezing, no crackles, no rhonchi  Abdomen: Soft, non tender, non distended, bowel sounds present, no guarding  Extremities: RUE still with edema, LE edema   Data Reviewed: Basic Metabolic Panel:  Recent Labs Lab 08/24/14 0600  08/25/14 0521 08/26/14 0656 08/27/14 0555 08/28/14 0555  NA 135 135 134* 133* 133*  K 4.5 3.7 4.2 4.3 4.4  CL 101 99* 98* 97* 98*  CO2 24 24 24 25 27   GLUCOSE 114* 218* 329* 401* 340*  BUN 45* 51* 35* 39* 42*  CREATININE 6.63* 7.12* 5.22* 5.96* 5.85*  CALCIUM 7.9* 8.1* 8.0* 8.1* 8.5*  PHOS 4.8* 4.8* 3.6 3.7 3.7   Liver Function Tests:  Recent Labs Lab 08/24/14 0600 08/25/14 0521 08/26/14 0656 08/27/14 0555 08/28/14 0555  ALBUMIN 1.4* 1.5* 1.6* 1.6* 1.5*   CBC:  Recent Labs Lab 08/25/14 0521 08/25/14 1555 08/26/14 0656 08/27/14 0555 08/28/14 0555  WBC 12.1* 13.2* 12.7* 13.8* 15.2*  HGB 7.7* 8.3* 7.6* 7.8* 7.9*  HCT 23.9* 25.5* 23.9* 24.3* 24.8*  MCV 87.9 87.9 88.5 89.3 89.5  PLT 427* 485* 360 399 396   Cardiac Enzymes:  Recent Labs Lab 08/22/14 1550  CKTOTAL  95   CBG:  Recent Labs Lab 08/27/14 1151 08/27/14 1640 08/27/14 2108 08/28/14 0751 08/28/14 1213  GLUCAP 373* 333* 271* 331* 205*    Recent Results (from the past 240 hour(s))  Anaerobic culture     Status: None   Collection Time: 08/20/14 10:40 AM  Result Value Ref Range Status   Specimen Description TISSUE  Final   Special Requests DEEP THIGH MUSCLE POF ANCEF CUP A  Final   Gram Stain   Final    ABUNDANT WBC PRESENT,BOTH PMN AND MONONUCLEAR NO SQUAMOUS EPITHELIAL CELLS SEEN RARE GRAM POSITIVE COCCI IN CLUSTERS Performed at [facility]    Culture   Final    NO ANAEROBES ISOLATED Performed at [facility]    Report Status 08/25/2014 FINAL  Final  Tissue culture     Status: None   Collection Time: 08/20/14 10:40 AM  Result Value Ref Range Status   Specimen Description TISSUE THIGH  Final   Special Requests POF ON ANCEF  Final   Gram Stain   Final    MODERATE WBC PRESENT,BOTH PMN AND MONONUCLEAR NO SQUAMOUS EPITHELIAL CELLS SEEN FEW GRAM POSITIVE COCCI IN CLUSTERS Performed at [facility]    Culture   Final    FEW STAPHYLOCOCCUS AUREUS Note: RIFAMPIN AND GENTAMICIN SHOULD NOT BE USED AS SINGLE DRUGS FOR TREATMENT OF STAPH INFECTIONS. Performed at [facility]    Report Status 08/23/2014 FINAL  Final   Organism ID, Bacteria STAPHYLOCOCCUS AUREUS  Final      Susceptibility   Staphylococcus aureus - MIC*    CLINDAMYCIN <=0.25 SENSITIVE Sensitive     ERYTHROMYCIN <=0.25 SENSITIVE Sensitive     GENTAMICIN <=0.5 SENSITIVE Sensitive     LEVOFLOXACIN 0.25 SENSITIVE Sensitive     OXACILLIN <=0.25 SENSITIVE Sensitive     PENICILLIN 0.12 SENSITIVE Sensitive     RIFAMPIN <=0.5 SENSITIVE Sensitive     TRIMETH/SULFA <=10 SENSITIVE Sensitive     VANCOMYCIN 1 SENSITIVE Sensitive     TETRACYCLINE <=1 SENSITIVE Sensitive     MOXIFLOXACIN <=0.25 SENSITIVE Sensitive     * FEW STAPHYLOCOCCUS AUREUS     Scheduled Meds: . amLODipine  10 mg  Oral Daily  .  ceFAZolin (ANCEF) IV  2 g Intravenous Q24H  . darbepoetin (ARANESP) injection - NON-DIALYSIS  100 mcg Subcutaneous Q Sun-1800  . feeding supplement (NEPRO CARB STEADY)  237 mL Oral BID BM  . furosemide  60 mg Intravenous Q12H  . heparin  5,000 Units Subcutaneous 3 times per day  .  insulin aspart  0-15 Units Subcutaneous TID WC  . insulin aspart  0-5 Units Subcutaneous QHS  . insulin glargine  12 Units Subcutaneous QHS  . levothyroxine  100 mcg Oral QAC breakfast  . megestrol  400 mg Oral Daily  . metoprolol tartrate  50 mg Oral BID  . multivitamin with minerals  1 tablet Oral Daily  . OxyCODONE  20 mg Oral Q12H  . polyethylene glycol  17 g Oral Daily  . senna-docusate  2 tablet Oral QHS  . sodium chloride  1,000 mL Intravenous Once  . sodium chloride  3 mL Intravenous Q12H   Continuous Infusions: . sodium chloride

## 2014-08-28 NOTE — Progress Notes (Signed)
Patient ID: KEMONIE DUCA, female   DOB: 1985/04/22, 29 y.o.   MRN: [id number] S:feels better but concerned about high blood sugars and not getting enough insulin O:BP 130/67 mmHg  Pulse 78  Temp(Src) 98.2 F (36.8 C) (Oral)  Resp 18  Ht 5\' 3"  (1.6 m)  Wt 86.94 kg (191 lb 10.7 oz)  BMI 33.96 kg/m2  SpO2 96%  LMP 07/18/2014  Intake/Output Summary (Last 24 hours) at 08/28/14 0939 Last data filed at 08/28/14 0710  Gross per 24 hour  Intake   1380 ml  Output   2000 ml  Net   -620 ml   Intake/Output: I/O last 3 completed shifts: In: 1500 [P.O.:1200; IV Piggyback:300] Out: 2000 [Urine:2000]  Intake/Output this shift:  Total I/O In: -  Out: 400 [Urine:400] Weight change: -0.06 kg (-2.1 oz) Gen:WD WN WF in NAD CVS:no rub Resp:cta [id number] Ext:+edema   Recent Labs Lab 08/22/14 0528 08/23/14 0525 08/24/14 0600 08/25/14 0521 08/26/14 0656 08/27/14 0555 08/28/14 0555  NA 135 134* 135 135 134* 133* 133*  K 3.6 3.6 4.5 3.7 4.2 4.3 4.4  CL 101 100* 101 99* 98* 97* 98*  CO2 24 23 24 24 24 25 27   GLUCOSE 119* 84 114* 218* 329* 401* 340*  BUN 39* 40* 45* 51* 35* 39* 42*  CREATININE 6.04* 6.17* 6.63* 7.12* 5.22* 5.96* 5.85*  ALBUMIN 1.4* 1.3* 1.4* 1.5* 1.6* 1.6* 1.5*  CALCIUM 7.8* 8.0* 7.9* 8.1* 8.0* 8.1* 8.5*  PHOS 4.2 4.3 4.8* 4.8* 3.6 3.7 3.7   Liver Function Tests:  Recent Labs Lab 08/26/14 0656 08/27/14 0555 08/28/14 0555  ALBUMIN 1.6* 1.6* 1.5*   No results for input(s): LIPASE, AMYLASE in the last 168 hours. No results for input(s): AMMONIA in the last 168 hours. CBC:  Recent Labs Lab 08/25/14 0521 08/25/14 1555 08/26/14 0656 08/27/14 0555 08/28/14 0555  WBC 12.1* 13.2* 12.7* 13.8* 15.2*  HGB 7.7* 8.3* 7.6* 7.8* 7.9*  HCT 23.9* 25.5* 23.9* 24.3* 24.8*  MCV 87.9 87.9 88.5 89.3 89.5  PLT 427* 485* 360 399 396   Cardiac Enzymes:  Recent Labs Lab 08/22/14 1550  CKTOTAL 95   CBG:  Recent Labs Lab 08/27/14 0712 08/27/14 1151  08/27/14 1640 08/27/14 2108 08/28/14 0751  GLUCAP 358* 373* 333* 271* 331*    Iron Studies: No results for input(s): IRON, TIBC, TRANSFERRIN, FERRITIN in the last 72 hours. Studies/Results: No results found. Marland Kitchen amLODipine  10 mg Oral Daily  .  ceFAZolin (ANCEF) IV  2 g Intravenous Q24H  . darbepoetin (ARANESP) injection - NON-DIALYSIS  100 mcg Subcutaneous Q Sun-1800  . feeding supplement (NEPRO CARB STEADY)  237 mL Oral BID BM  . furosemide  60 mg Intravenous Q12H  . heparin  5,000 Units Subcutaneous 3 times per day  . insulin aspart  0-15 Units Subcutaneous TID WC  . insulin aspart  0-5 Units Subcutaneous QHS  . insulin glargine  12 Units Subcutaneous QHS  . levothyroxine  100 mcg Oral QAC breakfast  . megestrol  400 mg Oral Daily  . metoprolol tartrate  50 mg Oral BID  . multivitamin with minerals  1 tablet Oral Daily  . OxyCODONE  20 mg Oral Q12H  . polyethylene glycol  17 g Oral Daily  . senna-docusate  2 tablet Oral QHS  . sodium chloride  1,000 mL Intravenous Once  . sodium chloride  3 mL Intravenous Q12H    BMET    Component Value Date/Time   NA 133* 08/28/2014 0555  NA 133* 01/18/2013 1106   K 4.4 08/28/2014 0555   CL 98* 08/28/2014 0555   CO2 27 08/28/2014 0555   GLUCOSE 340* 08/28/2014 0555   GLUCOSE 929* 01/18/2013 1106   BUN 42* 08/28/2014 0555   BUN 37* 01/18/2013 1106   CREATININE 5.85* 08/28/2014 0555   CALCIUM 8.5* 08/28/2014 0555   GFRNONAA 9* 08/28/2014 0555   GFRAA 10* 08/28/2014 0555   CBC    Component Value Date/Time   WBC 15.2* 08/28/2014 0555   WBC 20.0* 01/18/2013 1106   RBC 2.77* 08/28/2014 0555   RBC 2.92* 08/10/2014 1220   RBC 3.70* 01/18/2013 1106   HGB 7.9* 08/28/2014 0555   HCT 24.8* 08/28/2014 0555   PLT 396 08/28/2014 0555   MCV 89.5 08/28/2014 0555   MCH 28.5 08/28/2014 0555   MCH 29.5 01/18/2013 1106   MCHC 31.9 08/28/2014 0555   MCHC 31.9 01/18/2013 1106   RDW 16.4* 08/28/2014 0555   RDW 12.7 01/18/2013 1106    LYMPHSABS 0.9 08/13/2014 0540   LYMPHSABS 1.4 01/18/2013 1106   MONOABS 1.4* 08/13/2014 0540   EOSABS 0.1 08/13/2014 0540   EOSABS 0.0 01/18/2013 1106   BASOSABS 0.1 08/13/2014 0540   BASOSABS 0.0 01/18/2013 1106     Assessment/Plan:  1. AKI/CKD vs. Progressive CKD to stage 5- has advanced CKD at baseline due to poorly controlled DM and HTN further complicated by NSAIDs as well as likely ischemic ATN. Increased UOP. Underwent HD on 7/5 and 08/17/14 and since her last treatment her scr continues to slowly rise.  1. Remains non-oliguric and finally with improvement of Scr. 2. Last dialysis session on 08/25/14.  3. If her Scr starts to increase again will arrange for tunneled HD cath and outpt dialysis but hopefully we are seeing her recovering renal function.  Will d/c temp HD cath. 4. Discussed treatment options including dialysis as well as kidney pancreas transplant and if she is now ESRD will handle all of the referrals from the dialysis unit/SW. 2. Pyomyositis- s/p I&D of right arm as well as right thigh/leg with indwelling drains. On Ancef for MSSA 3. CKD stage 4-5 due to diabetic nephropathy- pt with h/o poorly controlled type 1 DM now with progressive CKD (Scr almost doubled since December 2015 with baseline of 3 and no labs until June 2016) 1. Discussed need for better DM control 2. Will need referral for kidney pancreas once she stabilizes 4. Hyponatremia- due to #1, improved with HD 5. DM- poorly controlled, management per primary svc, consider increasing night time lantus dose 6. Anemia- likely due to CKD s/p Aranesp 08/21/14 and continue with it weekly.  7. Vascular access- will likely need tunneled HD cath if she does not have return of renal function. Will need to hold off on AVF until pyomyositis has resolved. 1. Will d/c trialysis catheter today and if Scr starts to climb, will need HD cath placed on Monday Ladelle Teodoro A

## 2014-08-28 NOTE — Progress Notes (Signed)
Orthopedic Tech Progress Note Patient Details:  Tracy [name] 1985-11-29 [id number]  Ortho Devices Type of Ortho Device: Louretta Parma boot Ortho Device/Splint Location: Refused right Haematologist Ortho Device/Splint Interventions: Ordered, Application   Cammer, Theodoro Parma 08/28/2014, 3:00 PM

## 2014-08-29 LAB — RENAL FUNCTION PANEL
ALBUMIN: 1.8 g/dL — AB (ref 3.5–5.0)
ANION GAP: 10 (ref 5–15)
BUN: 45 mg/dL — ABNORMAL HIGH (ref 6–20)
CO2: 25 mmol/L (ref 22–32)
Calcium: 8.7 mg/dL — ABNORMAL LOW (ref 8.9–10.3)
Chloride: 100 mmol/L — ABNORMAL LOW (ref 101–111)
Creatinine, Ser: 6.32 mg/dL — ABNORMAL HIGH (ref 0.44–1.00)
GFR calc Af Amer: 9 mL/min — ABNORMAL LOW (ref >60)
GFR calc non Af Amer: 8 mL/min — ABNORMAL LOW (ref >60)
Glucose, Bld: 332 mg/dL — ABNORMAL HIGH (ref 65–99)
PHOSPHORUS: 3.9 mg/dL (ref 2.5–4.6)
Potassium: 4.4 mmol/L (ref 3.5–5.1)
Sodium: 135 mmol/L (ref 135–145)

## 2014-08-29 LAB — CBC
HEMATOCRIT: 25.8 % — AB (ref 36.0–46.0)
HEMOGLOBIN: 8.1 g/dL — AB (ref 12.0–15.0)
MCH: 28.2 pg (ref 26.0–34.0)
MCHC: 31.4 g/dL (ref 30.0–36.0)
MCV: 89.9 fL (ref 78.0–100.0)
PLATELETS: 382 10*3/uL (ref 150–400)
RBC: 2.87 MIL/uL — AB (ref 3.87–5.11)
RDW: 17.3 % — ABNORMAL HIGH (ref 11.5–15.5)
WBC: 14.1 10*3/uL — ABNORMAL HIGH (ref 4.0–10.5)

## 2014-08-29 LAB — GLUCOSE, CAPILLARY
GLUCOSE-CAPILLARY: 364 mg/dL — AB (ref 65–99)
Glucose-Capillary: 357 mg/dL — ABNORMAL HIGH (ref 65–99)
Glucose-Capillary: 245 mg/dL — ABNORMAL HIGH (ref 65–99)
Glucose-Capillary: 410 mg/dL — ABNORMAL HIGH (ref 65–99)

## 2014-08-29 LAB — IRON AND TIBC
Iron: 59 ug/dL (ref 28–170)
SATURATION RATIOS: 27 % (ref 10.4–31.8)
TIBC: 216 ug/dL — AB (ref 250–450)
UIBC: 157 ug/dL

## 2014-08-29 MED ORDER — INSULIN ASPART 100 UNIT/ML ~~LOC~~ SOLN
20.0000 [IU] | Freq: Once | SUBCUTANEOUS | Status: AC
  Administered 2014-08-29: 20 [IU] via SUBCUTANEOUS

## 2014-08-29 MED ORDER — FUROSEMIDE 80 MG PO TABS
80.0000 mg | ORAL_TABLET | Freq: Two times a day (BID) | ORAL | Status: DC
  Administered 2014-08-29 – 2014-09-01 (×7): 80 mg via ORAL
  Filled 2014-08-29 (×10): qty 1

## 2014-08-29 MED ORDER — AMLODIPINE BESYLATE 10 MG PO TABS
10.0000 mg | ORAL_TABLET | Freq: Every day | ORAL | Status: DC
  Administered 2014-08-29 – 2014-08-30 (×2): 10 mg via ORAL
  Filled 2014-08-29 (×3): qty 1

## 2014-08-29 MED ORDER — INSULIN ASPART 100 UNIT/ML ~~LOC~~ SOLN: 0.0000 [IU] | Freq: Three times a day (TID) | SUBCUTANEOUS | Status: DC

## 2014-08-29 MED ORDER — INSULIN ASPART 100 UNIT/ML ~~LOC~~ SOLN
0.0000 [IU] | Freq: Three times a day (TID) | SUBCUTANEOUS | Status: DC
  Administered 2014-08-30: 16 [IU] via SUBCUTANEOUS
  Administered 2014-08-30: 3 [IU] via SUBCUTANEOUS
  Administered 2014-08-30: 20 [IU] via SUBCUTANEOUS
  Administered 2014-08-31: 15 [IU] via SUBCUTANEOUS
  Administered 2014-08-31: 4 [IU] via SUBCUTANEOUS
  Administered 2014-09-01: 15 [IU] via SUBCUTANEOUS
  Administered 2014-09-02: 11 [IU] via SUBCUTANEOUS
  Administered 2014-09-02: 20 [IU] via SUBCUTANEOUS

## 2014-08-29 MED ORDER — INSULIN ASPART 100 UNIT/ML ~~LOC~~ SOLN
5.0000 [IU] | Freq: Three times a day (TID) | SUBCUTANEOUS | Status: DC
  Administered 2014-08-30 (×2): 5 [IU] via SUBCUTANEOUS

## 2014-08-29 MED ORDER — INSULIN GLARGINE 100 UNIT/ML ~~LOC~~ SOLN
15.0000 [IU] | Freq: Every day | SUBCUTANEOUS | Status: DC
  Administered 2014-08-29: 15 [IU] via SUBCUTANEOUS
  Filled 2014-08-29 (×2): qty 0.15

## 2014-08-29 MED ORDER — RENA-VITE PO TABS
1.0000 | ORAL_TABLET | Freq: Every day | ORAL | Status: DC
  Administered 2014-08-29 – 2014-09-01 (×4): 1 via ORAL
  Filled 2014-08-29 (×5): qty 1

## 2014-08-29 MED ORDER — DARBEPOETIN ALFA 200 MCG/0.4ML IJ SOSY: 200.0000 ug | PREFILLED_SYRINGE | INTRAMUSCULAR | Status: DC

## 2014-08-29 NOTE — Progress Notes (Signed)
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 Penicillins Hives and Swelling  . Sulfa Antibiotics Hives    Patient Measurements: Height: 5\' 3"  (160 cm) Weight: 191 lb 10.7 oz (86.94 kg) IBW/kg (Calculated) : 52.4 Adjusted Body Weight: n/a  Vital Signs:   Intake/Output from previous day: 07/17 0701 - 07/18 0700 In: 1010 [P.O.:960; IV Piggyback:50] Out: 1700 [Urine:1700] Intake/Output from this shift: Total I/O In: -  Out: 300 [Urine:300]  Labs:  Recent Labs  08/27/14 0555 08/28/14 0555 08/29/14 0540  WBC 13.8* 15.2* 14.1*  HGB 7.8* 7.9* 8.1*  PLT 399 396 382  CREATININE 5.96* 5.85* 6.32*   Estimated Creatinine Clearance: 13.6 mL/min (by C-G formula based on Cr of 6.32). No results for input(s): VANCOTROUGH, VANCOPEAK, VANCORANDOM, GENTTROUGH, GENTPEAK, GENTRANDOM, TOBRATROUGH, TOBRAPEAK, TOBRARND, AMIKACINPEAK, AMIKACINTROU, AMIKACIN in the last 72 hours.   Microbiology: 6/28 urine cx: >100,000 lactobacillus F 6/28 blood cx: neg 7/3: Tissue/Abscess x 2: MSSA 7/6 right thigh abscess - abundant Staph aureus- MSSA 7/6 right thigh abscess anaerobic - negative /final 7/12 7/9 thigh tissue: few Staph aureus - pan-sensitive 7/9 deep thigh muscle anaerobic - negative [place] (7/14)  Assessment: Ancef # 13, abx #21 for MSSA myositis/myonecrosis on RUE and RLE.   Pt with hx of allergic rxn to PCN: per pt she had urticaria to PCN in the 9th grade, no respiratory distress.  She got cefepime 2 mg x 1 on 01/20/2014 with no reaction.  She has gotten first dose of ancef with no allergic rxn per RN report. S/p I&D on 07/09  New HD pt - ARF on CKD.  Unclear if she has progressed to ESRD. To be assessed daily for HD needs/plans. Last HD 7/14 7/3 debridement in OR 7/6 R to biopsy and aspirate muscle of R thigh  vanc 6/26 >>7/6    7/5 VR prior to pt's first HD = 29 mcg/ml  (goal typically 15-25 mcg/ml prior to HD).  aztreonam 6/28 >>7/4 clinda 7/4 >>7/5 ancef 7/6 >>  Plan: -Ancef 2 gm IV q24, give after HD on HD days if HD for now. If no HD and renal function improves, will need to reassess dosing -f/u for HD sessions / changes  Thank you for allowing pharmacy to be part of this patients care team.  Uvaldo Rising, BCPS  Clinical Pharmacist Pager [phone]  08/29/2014 8:38 AM    08/29/2014 8:36 AM

## 2014-08-29 NOTE — Progress Notes (Signed)
     Subjective:  Patient reports pain as mild to moderate.  Resting comfortably in a chair.  Patient reports that pain is improving.  Dressing was changed yesterday due to some drainage, but no other issues at this time.   Objective:   VITALS:   Filed Vitals:   08/28/14 0524 08/28/14 0949 08/28/14 1743 08/29/14 0910  BP: 130/67 121/65 117/66 123/67  Pulse: 78 81 73 77  Temp: 98.2 F (36.8 C) 98.5 F (36.9 C) 98.5 F (36.9 C) 98 F (36.7 C)  TempSrc: Oral Oral Oral Oral  Resp: 18 18 20 20   Height:      Weight:      SpO2: 96% 94% 97% 91%    Neurologically intact ABD soft Neurovascular intact Sensation intact distally Intact pulses distally Dorsiflexion/Plantar flexion intact Incision: scant drainage   Lab Results  Component Value Date   WBC 14.1* 08/29/2014   HGB 8.1* 08/29/2014   HCT 25.8* 08/29/2014   MCV 89.9 08/29/2014   PLT 382 08/29/2014   BMET    Component Value Date/Time   NA 135 08/29/2014 0540   NA 133* 01/18/2013 1106   K 4.4 08/29/2014 0540   CL 100* 08/29/2014 0540   CO2 25 08/29/2014 0540   GLUCOSE 332* 08/29/2014 0540   GLUCOSE 929* 01/18/2013 1106   BUN 45* 08/29/2014 0540   BUN 37* 01/18/2013 1106   CREATININE 6.32* 08/29/2014 0540   CALCIUM 8.7* 08/29/2014 0540   GFRNONAA 8* 08/29/2014 0540   GFRAA 9* 08/29/2014 0540     Assessment/Plan: 9 Days Post-Op   Principal Problem:   Myositis Active Problems:   Proliferative diabetic retinopathy   Hypothyroidism   Normocytic anemia   Acute renal failure superimposed on stage 4 chronic kidney disease   Seizure   Sepsis   DM I (diabetes mellitus, type I), uncontrolled   Essential hypertension   Acute respiratory failure with hypoxia   Up with therapy WBAT in the RLE Patient seems to be improving.  We will continue to monitor while she is admitted. Once discharged we will see as outpatient.    Keshawn Fiorito [name] 08/29/2014, 4:50 PM Cell [phone]

## 2014-08-29 NOTE — Progress Notes (Signed)
Subjective: Interval History: has complaints edema, tired of being here.  Objective: Vital signs in last 24 hours: Temp:  [98 F (36.7 C)-98.5 F (36.9 C)] 98 F (36.7 C) (07/18 0910) Pulse Rate:  [73-81] 77 (07/18 0910) Resp:  [18-20] 20 (07/18 0910) BP: (117-123)/(65-67) 123/67 mmHg (07/18 0910) SpO2:  [91 %-97 %] 91 % (07/18 0910) Weight change:   Intake/Output from previous day: 07/17 0701 - 07/18 0700 In: 1010 [P.O.:960; IV Piggyback:50] Out: 1700 [Urine:1700] Intake/Output this shift: Total I/O In: 220 [P.O.:220] Out: 300 [Urine:300]  General appearance: alert, cooperative and pale Resp: diminished breath sounds bibasilar and rales bibasilar Cardio: S1, S2 normal and systolic murmur: holosystolic 2/6, blowing at apex GI: pos bs, liver down 4 cm Extremities: edema 3-4 and warm LE  Lab Results:  Recent Labs  08/28/14 0555 08/29/14 0540  WBC 15.2* 14.1*  HGB 7.9* 8.1*  HCT 24.8* 25.8*  PLT 396 382   BMET:  Recent Labs  08/28/14 0555 08/29/14 0540  NA 133* 135  K 4.4 4.4  CL 98* 100*  CO2 27 25  GLUCOSE 340* 332*  BUN 42* 45*  CREATININE 5.85* 6.32*  CALCIUM 8.5* 8.7*   No results for input(s): PTH in the last 72 hours. Iron Studies: No results for input(s): IRON, TIBC, TRANSFERRIN, FERRITIN in the last 72 hours.  Studies/Results: Dg Chest 2 View  08/28/2014   CLINICAL DATA:  29 year old female with cough, leukocytosis. Diabetes, hypertension, renal insufficiency recently started on hemodialysis. Right lower extremity abscess.  EXAM: CHEST  2 VIEW  COMPARISON:  07/26/2014 and earlier.  FINDINGS: Right IJ dialysis type catheter is been removed. Small to moderate bilateral pleural effusions persist. Associated lower lobe atelectasis. There are air bronchograms in the left lower lobe. Stable cardiac size and mediastinal contours. No acute pulmonary edema. No pneumothorax. Stable cholecystectomy clips. No acute osseous abnormality identified.  IMPRESSION:  Persistent small to moderate bilateral pleural effusions. Confluent bilateral lower lobe opacity is favored to be compressive atelectasis rather than pneumonia, but infection is difficult to exclude.   Electronically Signed   By: Genevie Ann M.D.   On: 08/28/2014 16:05    I have reviewed the patient's current medications.  Assessment/Plan: 1 CKD 4-5  Suspect with AKI now perm 5 .  Cr rising , if cont will need PC and perm access.  vol xs cont Lasix po. 2 Anemia ^ ESA,recheck Fe 3 HPTH check 4 DM per primary 5 Sepis  6 Pyomyositis improving P follow Cr, ^ ESA, AB, po Lasix    LOS: 20 days   Deneka Greenwalt L 08/29/2014,9:24 AM

## 2014-08-29 NOTE — Progress Notes (Signed)
Unna boot applied by ortho tech on the right lower extremity. Dressing change done aseptically on the surgical incision, cleansed with Betadine swab and dressed with Mepilex. Procedure tolerated well by pt.

## 2014-08-29 NOTE — Progress Notes (Signed)
Patient ID: Tracy [name], female   DOB: 1985-07-08, 29 y.o.   MRN: [id number]  [name] PROGRESS NOTE  Tracy [name] [id number] DOB: [birth year] DOA: 08/09/2014 PCP: [name]   Brief narrative:    29 year old Caucasian female with history of diabetes mellitus on insulin pump but poor control, hypothyroidism poor compliance she stopped taking her Synthroid 2 months ago due to insurance issues and medication affordability, chronic kidney disease stage IV baseline creatinine close to 3, who was admitted to the hospital with 2 week history of right arm and right leg pain, upon further workup with MRI was found that she had extensive myositis in both right-sided extremities. She was initially seen at [facility] in [place] and then transferred to [facility].  She was seen here by hand surgery, general surgery, orthopedic surgery, renal, infectious disease, phone consultation with rheumatologist Dr. Amil Amen. Initially no clear reason of myositis could be found, however since her clinical course did not show any improvement she was taken to the OR first by Dr. Amedeo Plenty, right arm fluid was drained which was suggestive of infectious myositis, she has been on IV antibiotics throughout her hospital stay. On 08/20/2014 she returned to the OR again by orthopedic surgery for drainage of right leg fluid collection. Her workup certainly suggests infectious myositis however reason for her bacteremia is not clear at all. ID is on board as well.  Note due to her illness patient's has progressed from chronic kidney disease stage IV. She received dialysis on 7/5 and 7/6, but not since then. Renal to decide whether this is ESRD or not. In addition, nursing noted on 7/10 morning that patient was hypoxic off of oxygen and requiring 3 L to stay above 90% with mild nonproductive cough and some low-grade temperatures. Chest x-ray done only noted mild pleural effusions but no infiltrate.  attempts have been made to aggressively diurese her and she's only had a moderate response. Her BUN and creatinine have subsequently risen and nephrology made decision to dialyze her again 7/14.   On 7/12, hemoglobin dropped below 7 so blood transfusion provided, patient herself doing better and  Wants to go home.  Assessment/Plan:    Mouth ulcer - Cont with PRN Magic mouthwash with lidocaine - improving and almost resolved   Myositis of the right arm, right thigh with MSSA - Status post right arm muscle biopsy along with fluid drainage and JP drain as well as right leg ET guided drainage - Source of bacteremia remains unclear. Echocardiogram was normal.  - per hand surgeon, recommend to continue aggressive ROM passive stretch and active ROM, active assisted motion. - will need outpatient OT - WBC trending down  - per ID continue 2 more weeks of ABX, this will give pt total of 4 weeks for MSSA soft tissue infection - can switch to oral cephalexin orally once ready for d/c until 09/09/2014 - ID team has singed off 7/15 and the assistance greatly appreciated   Active Problems:  Proliferative diabetic retinopathy - in the setting of poorly controlled DM - close outpatient follow up   Hypothyroidism - continue synthroid    Normocytic anemia from CKD and uncontrolled DM - iron deficiency and also in part from renal failure and chronic disease - Status post 2 units packed red blood cells transfusion on 7/6 and 7/12 - Hg stable in the past 48 hours, no sings of active bleeding  - also received Aranesp 7/10, continue once weekly    ARF superimposed  on stage 4 CKD, now ESRD, secondary to diabetic nephropathy  - Previous creatinine baseline at 3.  - nephrology following  - Cr continues trending up, will likely be considered ESRD & will need to arrange for tunneled HD cath, outpt HD    Acute respiratory failure with hypoxia secondary to volume overload  - Nursing noted that patient  required at least 3 L of oxygen recently - Chest x-ray with no signs of infiltrate, but did note mild pleural effusions leading [place] to start diuresing her aggressively.  - pt with no fever and WBC trending down    DM I (diabetes mellitus, type I), uncontrolled with renal disease - Poor outpatient control. A1c of 8.9. Currently on Lantus plus sliding scale - will increase lantus dose from 12 --> 15 U - appreciate diabetic educator assistance    Essential hypertension - continue Metoprolol, Norvasc, Lasix     Hyponatremia - from renal failure - improving with HD    Pain control - denies pain this AM - provide analgesia as needed  DVT prophylaxis - Heparin SQ  Code Status: Full.  Family Communication:  plan of care discussed with the patient and mother at bedside  Disposition Plan: Home when stable.   IV access:  Peripheral IV  Procedures and diagnostic studies:    7/03 Right arm muscle biopsy plus fluid drainage and JP drain placement of right arm  7/04 Right IJ dialysis catheter by interventional radiology  7/05 Dialysis session  7/06 Right leg ultrasound guided aspiration by interventional radiology  7/09 Leg expiration with drainage of fluid  7/06 and 7/12 Transfusion 2 units packed red blood cells  7/12 MR Humerus right WO contrast extensive myositis primarily involving the triceps and coracobrachialis muscles 7/12 MR Femur Right Wo Contrast severe muscle edema within the vastus medialis, vastus intermedius and sartorius  7/14/ CXR Moderate bilateral pleural effusions unchanged from 7/10  Medical Consultants:  Case discussed with rheumatology by phone. Interventional radiology. Infectious disease. Nephrology General surgery Hand surgery Orthopedic surgery  Other Consultants:  None   IAnti-Infectives:   IV vancomycin 6/28-7/5 IV Azactam 6/28-7/4 IV clindamycin 7/4-7/5 IV Ancef 7/6-present  Faye Ramsay, MD  [facility] Pager [phone]  If 7PM-7AM, please  contact night-coverage [email or web address] Password [email or web address] 08/29/2014, 1:48 PM   LOS: 20 days   HPI/Subjective: No events overnight.   Objective: Filed Vitals:   08/28/14 0524 08/28/14 0949 08/28/14 1743 08/29/14 0910  BP: 130/67 121/65 117/66 123/67  Pulse: 78 81 73 77  Temp: 98.2 F (36.8 C) 98.5 F (36.9 C) 98.5 F (36.9 C) 98 F (36.7 C)  TempSrc: Oral Oral Oral Oral  Resp: 18 18 20 20   Height:      Weight:      SpO2: 96% 94% 97% 91%    Intake/Output Summary (Last 24 hours) at 08/29/14 1348 Last data filed at 08/29/14 0910  Gross per 24 hour  Intake    750 ml  Output   1600 ml  Net   -850 ml    Exam:   General:  Pt is alert, follows commands appropriately, not in acute distress  Cardiovascular: Regular rate and rhythm, S1/S2, no murmurs, no rubs, no gallops  Respiratory: Clear to auscultation bilaterally, no wheezing, no crackles, no rhonchi  Abdomen: Soft, non tender, non distended, bowel sounds present, no guarding  Extremities: RUE still with edema, LE edema +3 up to thigh area   Data Reviewed: Basic Metabolic Panel:  Recent Labs Lab 08/25/14 0521 08/26/14 [id number]  08/27/14 0555 08/28/14 0555 08/29/14 0540  NA 135 134* 133* 133* 135  K 3.7 4.2 4.3 4.4 4.4  CL 99* 98* 97* 98* 100*  CO2 24 24 25 27 25   GLUCOSE 218* 329* 401* 340* 332*  BUN 51* 35* 39* 42* 45*  CREATININE 7.12* 5.22* 5.96* 5.85* 6.32*  CALCIUM 8.1* 8.0* 8.1* 8.5* 8.7*  PHOS 4.8* 3.6 3.7 3.7 3.9   Liver Function Tests:  Recent Labs Lab 08/25/14 0521 08/26/14 0656 08/27/14 0555 08/28/14 0555 08/29/14 0540  ALBUMIN 1.5* 1.6* 1.6* 1.5* 1.8*   CBC:  Recent Labs Lab 08/25/14 1555 08/26/14 0656 08/27/14 0555 08/28/14 0555 08/29/14 0540  WBC 13.2* 12.7* 13.8* 15.2* 14.1*  HGB 8.3* 7.6* 7.8* 7.9* 8.1*  HCT 25.5* 23.9* 24.3* 24.8* 25.8*  MCV 87.9 88.5 89.3 89.5 89.9  PLT 485* 360 399 396 382   Cardiac Enzymes:  Recent Labs Lab 08/22/14 1550  CKTOTAL 95   CBG:  Recent  Labs Lab 08/28/14 1213 08/28/14 1737 08/28/14 2114 08/29/14 0808 08/29/14 1149  GLUCAP 205* 298* 350* 357* 245*    Recent Results (from the past 240 hour(s))  Anaerobic culture     Status: None   Collection Time: 08/20/14 10:40 AM  Result Value Ref Range Status   Specimen Description TISSUE  Final   Special Requests DEEP THIGH MUSCLE POF ANCEF CUP A  Final   Gram Stain   Final    ABUNDANT WBC PRESENT,BOTH PMN AND MONONUCLEAR NO SQUAMOUS EPITHELIAL CELLS SEEN RARE GRAM POSITIVE COCCI IN CLUSTERS Performed at [facility]    Culture   Final    NO ANAEROBES ISOLATED Performed at [facility]    Report Status 08/25/2014 FINAL  Final  Tissue culture     Status: None   Collection Time: 08/20/14 10:40 AM  Result Value Ref Range Status   Specimen Description TISSUE THIGH  Final   Special Requests POF ON ANCEF  Final   Gram Stain   Final    MODERATE WBC PRESENT,BOTH PMN AND MONONUCLEAR NO SQUAMOUS EPITHELIAL CELLS SEEN FEW GRAM POSITIVE COCCI IN CLUSTERS Performed at [facility]    Culture   Final    FEW STAPHYLOCOCCUS AUREUS Note: RIFAMPIN AND GENTAMICIN SHOULD NOT BE USED AS SINGLE DRUGS FOR TREATMENT OF STAPH INFECTIONS. Performed at [facility]    Report Status 08/23/2014 FINAL  Final   Organism ID, Bacteria STAPHYLOCOCCUS AUREUS  Final      Susceptibility   Staphylococcus aureus - MIC*    CLINDAMYCIN <=0.25 SENSITIVE Sensitive     ERYTHROMYCIN <=0.25 SENSITIVE Sensitive     GENTAMICIN <=0.5 SENSITIVE Sensitive     LEVOFLOXACIN 0.25 SENSITIVE Sensitive     OXACILLIN <=0.25 SENSITIVE Sensitive     PENICILLIN 0.12 SENSITIVE Sensitive     RIFAMPIN <=0.5 SENSITIVE Sensitive     TRIMETH/SULFA <=10 SENSITIVE Sensitive     VANCOMYCIN 1 SENSITIVE Sensitive     TETRACYCLINE <=1 SENSITIVE Sensitive     MOXIFLOXACIN <=0.25 SENSITIVE Sensitive     * FEW STAPHYLOCOCCUS AUREUS     Scheduled Meds: . amLODipine  10 mg Oral QHS  .   ceFAZolin (ANCEF) IV  2 g Intravenous Q24H  . [START ON 09/04/2014] darbepoetin (ARANESP) injection - NON-DIALYSIS  200 mcg Subcutaneous Q Sun-1800  . feeding supplement (NEPRO CARB STEADY)  237 mL Oral BID BM  . furosemide  80 mg Oral BID  . heparin  5,000 Units Subcutaneous 3 times per day  .  insulin aspart  0-15 Units Subcutaneous TID WC  . insulin glargine  12 Units Subcutaneous QHS  . levothyroxine  100 mcg Oral QAC breakfast  . megestrol  400 mg Oral Daily  . metoprolol tartrate  50 mg Oral BID  . multivitamin  1 tablet Oral QHS  . nystatin   Topical TID  . OxyCODONE  20 mg Oral Q12H  . polyethylene glycol  17 g Oral Daily  . senna-docusate  2 tablet Oral QHS  . sodium chloride  1,000 mL Intravenous Once  . sodium chloride  3 mL Intravenous Q12H   Continuous Infusions: . sodium chloride

## 2014-08-29 NOTE — Progress Notes (Signed)
Inpatient Diabetes Program Recommendations  AACE/ADA: New Consensus Statement on Inpatient Glycemic Control (2013)  Target Ranges:  Prepandial:   less than 140 mg/dL      Peak postprandial:   less than 180 mg/dL (1-2 hours)      Critically ill patients:  140 - 180 mg/dL    Results for SHRINIDHI, BEALS (MRN [id number]) as of 08/29/2014 09:46  Ref. Range 08/28/2014 07:51 08/28/2014 12:13 08/28/2014 17:37 08/28/2014 21:14  Glucose-Capillary Latest Ref Range: 65-99 mg/dL 331 (H) 205 (H) 298 (H) 350 (H)    Results for CHINDA, SHINABERY (MRN [id number]) as of 08/29/2014 09:46  Ref. Range 08/29/2014 08:08  Glucose-Capillary Latest Ref Range: 65-99 mg/dL 357 (H)     Home DM Meds: Lantus 15 units QAM  Novolog 5 units tidwc  Current DM Orders: Lantus 12 units QHS  Novolog Moderate SSI (0-15 units) TID AC    -Eating 100% of meals.  -Note patient had Hypoglycemia on 07/10, 07/11/ and 07/12 last week.  -Having significant glucose elevations now.    MD- Please consider the following in-hospital insulin adjustments:  1. Increase Lantus to 15 units QHS (Home dose)  2. Add Novolog Meal Coverage- Novolog 5 units TIDWC (Home dose)    Will follow Wyn Quaker RN, MSN, CDE Diabetes Coordinator Inpatient Glycemic Control Team Team Pager: [phone] (8a-5p)

## 2014-08-30 ENCOUNTER — Inpatient Hospital Stay (HOSPITAL_COMMUNITY): Payer: BLUE CROSS/BLUE SHIELD

## 2014-08-30 DIAGNOSIS — Z01810 Encounter for preprocedural cardiovascular examination: Secondary | ICD-10-CM

## 2014-08-30 DIAGNOSIS — N186 End stage renal disease: Secondary | ICD-10-CM

## 2014-08-30 DIAGNOSIS — N184 Chronic kidney disease, stage 4 (severe): Secondary | ICD-10-CM

## 2014-08-30 LAB — RENAL FUNCTION PANEL
ALBUMIN: 2 g/dL — AB (ref 3.5–5.0)
ANION GAP: 12 (ref 5–15)
BUN: 50 mg/dL — ABNORMAL HIGH (ref 6–20)
CO2: 24 mmol/L (ref 22–32)
CREATININE: 6.84 mg/dL — AB (ref 0.44–1.00)
Calcium: 8.7 mg/dL — ABNORMAL LOW (ref 8.9–10.3)
Chloride: 96 mmol/L — ABNORMAL LOW (ref 101–111)
GFR, EST AFRICAN AMERICAN: 8 mL/min — AB (ref >60)
GFR, EST NON AFRICAN AMERICAN: 7 mL/min — AB (ref >60)
Glucose, Bld: 430 mg/dL — ABNORMAL HIGH (ref 65–99)
Phosphorus: 4.4 mg/dL (ref 2.5–4.6)
Potassium: 4.7 mmol/L (ref 3.5–5.1)
SODIUM: 132 mmol/L — AB (ref 135–145)

## 2014-08-30 LAB — CBC
HCT: 27.1 % — ABNORMAL LOW (ref 36.0–46.0)
HEMOGLOBIN: 8.4 g/dL — AB (ref 12.0–15.0)
MCH: 28.5 pg (ref 26.0–34.0)
MCHC: 31 g/dL (ref 30.0–36.0)
MCV: 91.9 fL (ref 78.0–100.0)
PLATELETS: 392 10*3/uL (ref 150–400)
RBC: 2.95 MIL/uL — AB (ref 3.87–5.11)
RDW: 17.8 % — ABNORMAL HIGH (ref 11.5–15.5)
WBC: 13.7 10*3/uL — ABNORMAL HIGH (ref 4.0–10.5)

## 2014-08-30 LAB — GLUCOSE, CAPILLARY
GLUCOSE-CAPILLARY: 290 mg/dL — AB (ref 65–99)
GLUCOSE-CAPILLARY: 183 mg/dL — AB (ref 65–99)
Glucose-Capillary: 419 mg/dL — ABNORMAL HIGH (ref 65–99)
Glucose-Capillary: 136 mg/dL — ABNORMAL HIGH (ref 65–99)

## 2014-08-30 LAB — PARATHYROID HORMONE, INTACT (NO CA): PTH: 36 pg/mL (ref 15–65)

## 2014-08-30 MED ORDER — INSULIN ASPART 100 UNIT/ML ~~LOC~~ SOLN
7.0000 [IU] | Freq: Three times a day (TID) | SUBCUTANEOUS | Status: DC
  Administered 2014-08-30 – 2014-09-02 (×5): 7 [IU] via SUBCUTANEOUS

## 2014-08-30 MED ORDER — INSULIN GLARGINE 100 UNIT/ML ~~LOC~~ SOLN
20.0000 [IU] | Freq: Every day | SUBCUTANEOUS | Status: DC
  Administered 2014-08-30 – 2014-09-01 (×3): 20 [IU] via SUBCUTANEOUS
  Filled 2014-08-30 (×4): qty 0.2

## 2014-08-30 NOTE — Progress Notes (Signed)
Right  Upper Extremity Vein Map    Cephalic  Segment Diameter Depth Comment  1. Axilla 1.56mm    2. Mid upper arm 1.42mm    3. Above [facility] 1.92mm    4. In [facility] 1.40mm    5. Below AC 2.47mm    6. Mid forearm 1.71mm    7. Wrist   Unable to visualize due to IV location.                  Basilic  Segment Diameter Depth Comment  1. Axilla 3.72mm 21.67mm   2. Mid upper arm 3.8mm 13.82mm   3. Above [facility] 3.60mm 6.55mm Branch  4. In [facility] 3.71mm 6.68mm Branch  5. Below AC 2.32mm 5.24mm   6. Mid forearm 1.56mm 4.52mm   7. Wrist   Unable to visualize due to IV location.                  Left Upper Extremity Vein Map    Cephalic  Segment Diameter Depth Comment  1. Axilla 3.13mm    2. Mid upper arm 2.54mm    3. Above [facility] 2.76mm  Branch  4. In [facility] 3.44mm    5. Below AC 2.61mm 26.60mm   6. Mid forearm   Unable to adequately evaluate  7. Wrist   Unable to adequately evaluate                  Basilic  Segment Diameter Depth Comment  1. Axilla 5.23mm 17.54mm   2. Mid upper arm 3.68mm 20.70mm   3. Above [facility] 3.52mm 16.38mm Branch  4. In [facility] 2.63mm 4.91mm   5. Below AC   Unable to adequately evaluate  6. Mid forearm   Unable to adequately evaluate  7. Wrist   Unable to adequately evaluate                  08/30/2014 5:46 PM Maudry Mayhew, RVT, RDCS, RDMS

## 2014-08-30 NOTE — Progress Notes (Addendum)
Inpatient Diabetes Program Recommendations  AACE/ADA: New Consensus Statement on Inpatient Glycemic Control (2013)  Target Ranges:  Prepandial:   less than 140 mg/dL      Peak postprandial:   less than 180 mg/dL (1-2 hours)      Critically ill patients:  140 - 180 mg/dL   Results for Tracy [name], Tracy [name] (MRN [id number]) as of 08/30/2014 12:51  Ref. Range 08/29/2014 08:08 08/29/2014 11:49 08/29/2014 17:17 08/29/2014 21:34  Glucose-Capillary Latest Ref Range: 65-99 mg/dL 357 (H) 245 (H) 410 (H) 364 (H)    Results for Tracy [name], Tracy [name] (MRN [id number]) as of 08/30/2014 12:51  Ref. Range 08/30/2014 08:21 08/30/2014 11:51  Glucose-Capillary Latest Ref Range: 65-99 mg/dL 419 (H) 290 (H)     Home DM Meds: Lantus 15 units QAM  Novolog 5 units tidwc  Current DM Orders: Lantus 15 units QHS  Novolog Resistant SSI (0-20 units) TID AC            Novolog 5 units TIDWC    -Eating 100% of meals.  -Note patient had Hypoglycemia on 07/10, 07/11/ and 07/12 last week.  -Having significant glucose elevations now.  -Note Lantus and SSI increased last PM.  Also note patient received 1st dose of Novolog Meal Coverage this AM.    MD- Please consider the following in-hospital insulin adjustments if patient continues to have significant glucose elevations:  1. Increase Lantus to 20 units QHS  2. Increase Novolog Meal Coverage to 7 units TIDWC   Will follow Wyn Quaker RN, MSN, CDE Diabetes Coordinator Inpatient Glycemic Control Team Team Pager: [phone] (8a-5p)

## 2014-08-30 NOTE — Progress Notes (Signed)
Subjective: Interval History: has complaints wants to get home.  Objective: Vital signs in last 24 hours: Temp:  [98.4 F (36.9 C)-99.2 F (37.3 C)] 98.9 F (37.2 C) (07/19 0823) Pulse Rate:  [68-81] 79 (07/19 0823) Resp:  [16-18] 17 (07/19 0823) BP: (119-128)/(63-68) 126/66 mmHg (07/19 0823) SpO2:  [90 %-98 %] 92 % (07/19 0823) Weight:  [83.008 kg (183 lb)] 83.008 kg (183 lb) (07/19 0416) Weight change:   Intake/Output from previous day: 07/18 0701 - 07/19 0700 In: 900 [P.O.:900] Out: 2325 [Urine:2325] Intake/Output this shift: Total I/O In: -  Out: 750 [Urine:750]  General appearance: alert, cooperative and pale Resp: diminished breath sounds bilaterally and rales bibasilar Cardio: S1, S2 normal and systolic murmur: holosystolic 2/6, blowing at apex GI: obese, pos bs, soft Extremities: edema 3+ and dressing L calf, incision RUA  Lab Results:  Recent Labs  08/29/14 0540 08/30/14 0546  WBC 14.1* 13.7*  HGB 8.1* 8.4*  HCT 25.8* 27.1*  PLT 382 392   BMET:  Recent Labs  08/29/14 0540 08/30/14 0546  NA 135 132*  K 4.4 4.7  CL 100* 96*  CO2 25 24  GLUCOSE 332* 430*  BUN 45* 50*  CREATININE 6.32* 6.84*  CALCIUM 8.7* 8.7*    Recent Labs  08/29/14 1031  PTH 36   Iron Studies:  Recent Labs  08/29/14 1031  IRON 59  TIBC 216*    Studies/Results: Dg Chest 2 View  08/28/2014   CLINICAL DATA:  29 year old female with cough, leukocytosis. Diabetes, hypertension, renal insufficiency recently started on hemodialysis. Right lower extremity abscess.  EXAM: CHEST  2 VIEW  COMPARISON:  07/26/2014 and earlier.  FINDINGS: Right IJ dialysis type catheter is been removed. Small to moderate bilateral pleural effusions persist. Associated lower lobe atelectasis. There are air bronchograms in the left lower lobe. Stable cardiac size and mediastinal contours. No acute pulmonary edema. No pneumothorax. Stable cholecystectomy clips. No acute osseous abnormality identified.   IMPRESSION: Persistent small to moderate bilateral pleural effusions. Confluent bilateral lower lobe opacity is favored to be compressive atelectasis rather than pneumonia, but infection is difficult to exclude.   Electronically Signed   By: Genevie Ann M.D.   On: 08/28/2014 16:05    I have reviewed the patient's current medications.  Assessment/Plan: 1 CKD 5 worsening, needs PC and perm access 2 Anemia stable esa 3 HPTH  4 vol xs 5 DM not controlled 6 pyomositis better P PC, perm access, diuresis    LOS: 21 days   Maley Venezia L 08/30/2014,9:13 AM

## 2014-08-30 NOTE — Progress Notes (Signed)
Patient ID: Tracy [name], female   DOB: April 08, 1985, 29 y.o.   MRN: [id number]  [name] PROGRESS NOTE  ADDA HUND [id number] DOB: May 06, 1985 DOA: 08/09/2014 PCP: [name]   Brief narrative:    29 year old Caucasian female with history of diabetes mellitus on insulin pump but poor control, hypothyroidism poor compliance she stopped taking her Synthroid 2 months ago due to insurance issues and medication affordability, chronic kidney disease stage IV baseline creatinine close to 3, who was admitted to the hospital with 2 week history of right arm and right leg pain, upon further workup with MRI was found that she had extensive myositis in both right-sided extremities. She was initially seen at [facility] in [place] and then transferred to [facility].  She was seen here by hand surgery, general surgery, orthopedic surgery, renal, infectious disease, phone consultation with rheumatologist Dr. Amil Amen. Initially no clear reason of myositis could be found, however since her clinical course did not show any improvement she was taken to the OR first by Dr. Amedeo Plenty, right arm fluid was drained which was suggestive of infectious myositis, she has been on IV antibiotics throughout her hospital stay. On 08/20/2014 she returned to the OR again by orthopedic surgery for drainage of right leg fluid collection. Her workup certainly suggests infectious myositis however reason for her bacteremia is not clear at all. ID is on board as well.  Note due to her illness patient's has progressed from chronic kidney disease stage IV. She received dialysis on 7/5 and 7/6, but not since then. Renal to decide whether this is ESRD or not. In addition, nursing noted on 7/10 morning that patient was hypoxic off of oxygen and requiring 3 L to stay above 90% with mild nonproductive cough and some low-grade temperatures. Chest x-ray done only noted mild pleural effusions but no infiltrate.  attempts have been made to aggressively diurese her and she's only had a moderate response. Her BUN and creatinine have subsequently risen and nephrology made decision to dialyze her again 7/14.   On 7/12, hemoglobin dropped below 7 so blood transfusion provided, patient herself doing better and  Wants to go home.  Assessment/Plan:    Mouth ulcer - resolved   Myositis of the right arm, right thigh with MSSA - Status post right arm muscle biopsy along with fluid drainage and JP drain as well as right leg ET guided drainage - Source of bacteremia remains unclear. Echocardiogram was normal.  - per hand surgeon, recommend to continue aggressive ROM passive stretch and active ROM, active assisted motion - per ID continue 2 more weeks of ABX, this will give pt total of 4 weeks for MSSA soft tissue infection - can switch to oral cephalexin orally once ready for d/c until 09/09/2014 - ID team has singed off 7/15 and the assistance greatly appreciated  - WBC is trending down   Active Problems:  Proliferative diabetic retinopathy - in the setting of poorly controlled DM - close outpatient follow up   Hypothyroidism - continue synthroid    Normocytic anemia from CKD and uncontrolled DM - iron deficiency and also in part from renal failure and chronic disease - Status post 2 units packed red blood cells transfusion on 7/6 and 7/12 - Hg stable in the past 48 hours, no sings of active bleeding  - also received Aranesp 7/10, continue once weekly    ARF superimposed on stage 4 CKD, now ESRD, secondary to diabetic nephropathy  - Previous creatinine baseline  at 3.  - nephrology following  - Cr continues trending up, considered ESRD & will need permanent access prior to discharge     Acute respiratory failure with hypoxia secondary to volume overload  - Nursing noted that patient required at least 3 L of oxygen recently - Chest x-ray with no signs of infiltrate, but did note mild pleural  effusions leading [place] to start diuresing her aggressively.  - pt with no fever and WBC trending down    DM I (diabetes mellitus, type I), uncontrolled with renal disease - Poor outpatient control. A1c of 8.9. Currently on Lantus plus sliding scale - will increase lantus dose to 20 U, meal coverage insulin up to 7 U - appreciate diabetic educator assistance    Essential hypertension - continue Metoprolol, Norvasc, Lasix     Hyponatremia - from renal failure - improving with HD    Pain control - denies pain this AM - provide analgesia as needed    Obesity  - Body mass index is 32.43 kg/(m^2).  DVT prophylaxis - Heparin SQ  Code Status: Full.  Family Communication:  plan of care discussed with the patient and mother at bedside  Disposition Plan: Home when stable.   IV access:  Peripheral IV  Procedures and diagnostic studies:    7/03 Right arm muscle biopsy plus fluid drainage and JP drain placement of right arm  7/04 Right IJ dialysis catheter by interventional radiology  7/05 Dialysis session  7/06 Right leg ultrasound guided aspiration by interventional radiology  7/09 Leg expiration with drainage of fluid  7/06 and 7/12 Transfusion 2 units packed red blood cells  7/12 MR Humerus right WO contrast extensive myositis primarily involving the triceps and coracobrachialis muscles 7/12 MR Femur Right Wo Contrast severe muscle edema within the vastus medialis, vastus intermedius and sartorius  7/14/ CXR Moderate bilateral pleural effusions unchanged from 7/10  Medical Consultants:  Case discussed with rheumatology by phone. Interventional radiology. Infectious disease. Nephrology General surgery Hand surgery Orthopedic surgery  Other Consultants:  None   IAnti-Infectives:   IV vancomycin 6/28-7/5 IV Azactam 6/28-7/4 IV clindamycin 7/4-7/5 IV Ancef 7/6-present  Faye Ramsay, MD  [facility] Pager [phone]  If 7PM-7AM, please contact  night-coverage [email or web address] Password [email or web address] 08/30/2014, 4:10 PM   LOS: 21 days   HPI/Subjective: No events overnight.   Objective: Filed Vitals:   08/29/14 2038 08/30/14 0416 08/30/14 0823 08/30/14 1106  BP: 128/68 119/63 126/66 129/71  Pulse: 81 68 79 77  Temp: 98.4 F (36.9 C) 99.2 F (37.3 C) 98.9 F (37.2 C)   TempSrc: Oral  Oral   Resp: 16 18 17    Height:      Weight:  83.008 kg (183 lb)    SpO2: 98% 92% 92%     Intake/Output Summary (Last 24 hours) at 08/30/14 1610 Last data filed at 08/30/14 1406  Gross per 24 hour  Intake    920 ml  Output   3475 ml  Net  -2555 ml    Exam:   General:  Pt is alert, follows commands appropriately, not in acute distress  Cardiovascular: Regular rate and rhythm, S1/S2, no murmurs, no rubs, no gallops  Respiratory: Clear to auscultation bilaterally, no wheezing, no crackles, no rhonchi  Abdomen: Soft, non tender, non distended, bowel sounds present, no guarding  Extremities: RUE still with edema, LE edema +3 up to thigh area   Data Reviewed: Basic Metabolic Panel:  Recent Labs Lab 08/26/14 0656 08/27/14 0555 08/28/14 0555 08/29/14 0540  08/30/14 0546  NA 134* 133* 133* 135 132*  K 4.2 4.3 4.4 4.4 4.7  CL 98* 97* 98* 100* 96*  CO2 24 25 27 25 24   GLUCOSE 329* 401* 340* 332* 430*  BUN 35* 39* 42* 45* 50*  CREATININE 5.22* 5.96* 5.85* 6.32* 6.84*  CALCIUM 8.0* 8.1* 8.5* 8.7* 8.7*  PHOS 3.6 3.7 3.7 3.9 4.4   Liver Function Tests:  Recent Labs Lab 08/26/14 0656 08/27/14 0555 08/28/14 0555 08/29/14 0540 08/30/14 0546  ALBUMIN 1.6* 1.6* 1.5* 1.8* 2.0*   CBC:  Recent Labs Lab 08/26/14 0656 08/27/14 0555 08/28/14 0555 08/29/14 0540 08/30/14 0546  WBC 12.7* 13.8* 15.2* 14.1* 13.7*  HGB 7.6* 7.8* 7.9* 8.1* 8.4*  HCT 23.9* 24.3* 24.8* 25.8* 27.1*  MCV 88.5 89.3 89.5 89.9 91.9  PLT 360 399 396 382 392   CBG:  Recent Labs Lab 08/29/14 1149 08/29/14 1717 08/29/14 2134 08/30/14 0821 08/30/14 1151   GLUCAP 245* 410* 364* 419* 290*    Scheduled Meds: . amLODipine  10 mg Oral QHS  .  ceFAZolin (ANCEF) IV  2 g Intravenous Q24H  . [START ON 09/04/2014] darbepoetin (ARANESP) injection - NON-DIALYSIS  200 mcg Subcutaneous Q Sun-1800  . feeding supplement (NEPRO CARB STEADY)  237 mL Oral BID BM  . furosemide  80 mg Oral BID  . heparin  5,000 Units Subcutaneous 3 times per day  . insulin aspart  0-20 Units Subcutaneous TID WC  . insulin aspart  5 Units Subcutaneous TID WC  . insulin glargine  15 Units Subcutaneous QHS  . levothyroxine  100 mcg Oral QAC breakfast  . megestrol  400 mg Oral Daily  . metoprolol tartrate  50 mg Oral BID  . multivitamin  1 tablet Oral QHS  . nystatin   Topical TID  . OxyCODONE  20 mg Oral Q12H  . polyethylene glycol  17 g Oral Daily  . sodium chloride  1,000 mL Intravenous Once  . sodium chloride  3 mL Intravenous Q12H   Continuous Infusions: . sodium chloride

## 2014-08-30 NOTE — Consult Note (Signed)
[facility] NOTE   MRN : [id number]  Reason for Consult: ESRD Referring Physician:Dr. Deterding  History of Present Illness: 29 y/o female with acute on chronic stage IV kidney disease with a current CR of 6.84.  We have been ask to provide permanent dialysis access as well as a diatek catheter.  Her past medical history includes hypertension managed on Norvasc and metoprolol and juvenile onset  DM managed with insulin.  Her initial presentation to the hospital was due to right upper and lower extremity pain after a musculoskeletal injury and accompanying nausea and vomiting.       Current Facility-Administered Medications  Medication Dose Route Frequency Provider Last Rate Last Dose  . 0.9 %  sodium chloride infusion   Intravenous Continuous Jeryl Columbia, NP      . acetaminophen (TYLENOL) tablet 650 mg  650 mg Oral Q6H PRN Ivor Costa, MD       Or  . acetaminophen (TYLENOL) suppository 650 mg  650 mg Rectal Q6H PRN Ivor Costa, MD      . amLODipine (NORVASC) tablet 10 mg  10 mg Oral QHS Mauricia Area, MD   10 mg at 08/29/14 2201  . ceFAZolin (ANCEF) IVPB 2 g/50 mL premix  2 g Intravenous Q24H Eudelia Bunch, RPH   2 g at 08/29/14 1653  . [START ON 09/04/2014] Darbepoetin Alfa (ARANESP) injection 200 mcg  200 mcg Subcutaneous Q Sun-1800 Mauricia Area, MD      . diphenhydrAMINE (BENADRYL) injection 25 mg  25 mg Intravenous Q6H PRN Thurnell Lose, MD      . feeding supplement (NEPRO CARB STEADY) liquid 237 mL  237 mL Oral BID BM Dale [place], RD   237 mL at 08/25/14 1400  . furosemide (LASIX) tablet 80 mg  80 mg Oral BID Mauricia Area, MD   80 mg at 08/30/14 0844  . heparin injection 5,000 Units  5,000 Units Subcutaneous 3 times per day Monia Sabal, PA-C   5,000 Units at 08/30/14 [id number]  . hydrALAZINE (APRESOLINE) injection 10 mg  10 mg Intravenous Q6H PRN Thurnell Lose, MD      . insulin aspart (novoLOG) injection 0-20 Units  0-20 Units  Subcutaneous TID WC Theodis Blaze, MD   20 Units at 08/30/14 0929  . insulin aspart (novoLOG) injection 5 Units  5 Units Subcutaneous TID WC Theodis Blaze, MD   5 Units at 08/30/14 [phone]  . insulin glargine (LANTUS) injection 15 Units  15 Units Subcutaneous QHS Theodis Blaze, MD   15 Units at 08/29/14 2202  . levothyroxine (SYNTHROID, LEVOTHROID) tablet 100 mcg  100 mcg Oral QAC breakfast Thurnell Lose, MD   100 mcg at 08/30/14 0844  . magic mouthwash w/lidocaine  15 mL Oral QID PRN Jeryl Columbia, NP   15 mL at 08/29/14 0804  . megestrol (MEGACE) 400 MG/10ML suspension 400 mg  400 mg Oral Daily Annita Brod, MD   400 mg at 08/28/14 1030  . metoprolol (LOPRESSOR) tablet 50 mg  50 mg Oral BID Thurnell Lose, MD   50 mg at 08/29/14 2201  . multivitamin (RENA-VIT) tablet 1 tablet  1 tablet Oral QHS Mauricia Area, MD   1 tablet at 08/29/14 2201  . nystatin (MYCOSTATIN/NYSTOP) topical powder   Topical TID Theodis Blaze, MD      . ondansetron [facility]) injection 4 mg  4 mg Intravenous Q8H PRN Ivor Costa, MD  4 mg at 08/16/14 1359  . oxyCODONE (Oxy IR/ROXICODONE) immediate release tablet 5 mg  5 mg Oral Q4H PRN Annita Brod, MD      . OxyCODONE (OXYCONTIN) 12 hr tablet 20 mg  20 mg Oral Q12H Annita Brod, MD   20 mg at 08/29/14 2201  . polyethylene glycol (MIRALAX / GLYCOLAX) packet 17 g  17 g Oral Daily Annita Brod, MD   17 g at 08/28/14 1029  . sodium chloride 0.9 % bolus 1,000 mL  1,000 mL Intravenous Once [employer], PA-C   1,000 mL at 08/09/14 2202  . sodium chloride 0.9 % injection 10-40 mL  10-40 mL Intracatheter PRN Elmarie Shiley, MD   10 mL at 08/28/14 0555  . sodium chloride 0.9 % injection 3 mL  3 mL Intravenous Q12H Ivor Costa, MD   3 mL at 08/29/14 2200  . zolpidem (AMBIEN) tablet 5 mg  5 mg Oral QHS PRN Gardiner Barefoot, NP   5 mg at 08/21/14 2148    Pt meds include: Statin :No Betablocker: Yes ASA: No Other anticoagulants/antiplatelets: none  Past  Medical History  Diagnosis Date  . Thyroid enlargement     "not on medication at this time"  . Urinary tract infection     hx of  . Anemia     presently on iron supplement  . Diabetes mellitus     insulin pump   . Hypothyroidism   . Anxiety   . HSV-2 (herpes simplex virus 2) infection   . HSV-1 (herpes simplex virus 1) infection   . Detached retina   . Yeast infection     took diflucan saturday   . Hypertension   . Irregular periods 07/05/2014  . Vaginal discharge 07/05/2014    Past Surgical History  Procedure Laterality Date  . Cholecystectomy    . Pilonidal cyst excision    . Wisdom tooth extraction    . Pars plana vitrectomy  10/01/2011    Procedure: PARS PLANA VITRECTOMY WITH 25 GAUGE;  Surgeon: Hayden Pedro, MD;  Location: [place];  Service: Ophthalmology;  Laterality: Right;  Repair Complex Traction Retinal Detachment  . Trigger finger release Right 09/21/13  . I&d extremity Right 08/14/2014    Procedure: IRRIGATION AND DEBRIDEMENT EXTREMITY WITH MUSCLE BIOPSY;  Surgeon: Roseanne Kaufman, MD;  Location: [place];  Service: Orthopedics;  Laterality: Right;  . I&d extremity Right 08/20/2014    Procedure:  I AND D LEG / THIGH ABSCESS AND MANIPULATION OF RIGHT ELBOW.;  Surgeon: Renette Butters, MD;  Location: [place];  Service: Orthopedics;  Laterality: Right;    Social History History  Substance Use Topics  . Smoking status: Never Smoker   . Smokeless tobacco: Never Used  . Alcohol Use: No    Family History Family History  Problem Relation Age of Onset  . Cancer Paternal Grandfather     prostate  . Hyperlipidemia Paternal Grandfather   . Stroke Paternal Grandfather     Allergies  Allergen Reactions  . Penicillins Hives and Swelling  . Sulfa Antibiotics Hives     REVIEW OF SYSTEMS  General: [ ]  Weight loss, [ ]  Fever, [ ]  chills Neurologic: [ ]  Dizziness, [ ]  Blackouts, [ ]  Seizure [ ]  Stroke, [ ]  "Mini stroke", [ ]  Slurred speech, [ ]  Temporary blindness; [ ]   weakness in arms or legs, [ ]  Hoarseness [ ]  Dysphagia Cardiac: [ ]  Chest pain/pressure, [ ]  Shortness of breath at rest [ ]   Shortness of breath with exertion, [ ]  Atrial fibrillation or irregular heartbeat  Vascular: [ ]  Pain in legs with walking, [ ]  Pain in legs at rest, [ ]  Pain in legs at night,  [ ]  Non-healing ulcer, [ ]  Blood clot in vein/DVT,   Pulmonary: [ ]  Home oxygen, [ ]  Productive cough, [ ]  Coughing up blood, [ ]  Asthma,  [ ]  Wheezing [ ]  COPD Musculoskeletal:  [ ]  Arthritis, [ ]  Low back pain, [ ]  Joint pain Hematologic: [ ]  Easy Bruising, [ ]  Anemia; [ ]  Hepatitis Gastrointestinal: [ ]  Blood in stool, [ ]  Gastroesophageal Reflux/heartburn, Urinary: [x ] chronic Kidney disease, [ ]  on HD - [ ]  MWF or [ ]  TTHS, [ ]  Burning with urination, [ ]  Difficulty urinating Skin: [ ]  Rashes, [x ] Wounds Psychological: [ x] Anxiety, [ ]  Depression  Physical Examination Filed Vitals:   08/29/14 1719 08/29/14 2038 08/30/14 0416 08/30/14 0823  BP: 127/64 128/68 119/63 126/66  Pulse: 77 81 68 79  Temp: 99.1 F (37.3 C) 98.4 F (36.9 C) 99.2 F (37.3 C) 98.9 F (37.2 C)  TempSrc: Oral Oral  Oral  Resp: 18 16 18 17   Height:      Weight:   183 lb (83.008 kg)   SpO2: 90% 98% 92% 92%   Body mass index is 32.43 kg/(m^2).  General:  WDWN in NAD HENT: WNL Eyes: Pupils equal Pulmonary: normal non-labored breathing , without Rales, rhonchi,  wheezing Cardiac: RRR, without  Murmurs, rubs or gallops; No carotid bruits Abdomen: soft, NT, no masses Skin: no rashes, ulcers noted;  no Gangrene , no cellulitis; no open wounds; right LE dressing s/p ID and right upper arm about the triceps region.  Vascular Exam/Pulses:palpable radial and brachial pulses bilaterally   Musculoskeletal: no muscle wasting or atrophy; generalized edema  Neurologic: A&O X 3; Appropriate Affect ;  SENSATION: normal; MOTOR FUNCTION: 5/5 Symmetric Speech is fluent/normal   Significant Diagnostic  Studies: CBC Lab Results  Component Value Date   WBC 13.7* 08/30/2014   HGB 8.4* 08/30/2014   HCT 27.1* 08/30/2014   MCV 91.9 08/30/2014   PLT 392 08/30/2014    BMET    Component Value Date/Time   NA 132* 08/30/2014 0546   NA 133* 01/18/2013 1106   K 4.7 08/30/2014 0546   CL 96* 08/30/2014 0546   CO2 24 08/30/2014 0546   GLUCOSE 430* 08/30/2014 0546   GLUCOSE 929* 01/18/2013 1106   BUN 50* 08/30/2014 0546   BUN 37* 01/18/2013 1106   CREATININE 6.84* 08/30/2014 0546   CALCIUM 8.7* 08/30/2014 0546   GFRNONAA 7* 08/30/2014 0546   GFRAA 8* 08/30/2014 0546   Estimated Creatinine Clearance: 12.3 mL/min (by C-G formula based on Cr of 6.84).  COAG Lab Results  Component Value Date   INR 1.40 08/16/2014   INR 1.31 08/09/2014   Cultures positive staphylococcus   Non-Invasive Vascular Imaging: pending vein mapping  ASSESSMENT/PLAN:  Acute on CKD stage IV She was on temporary dialysis via IR placed cath on 08/15/2014, but this was removed She is right hand dominant pending vein mapping we will creat  a left UE AV fistula and place a diatek catheter.   Her WBC are elevated 13.7, but are decreasing.  She is currently on IV antibiotics for Saph infection post I & D per Orthopedics.   Laurence Sl[facility] 08/30/2014 9:56 AM   Pt admitted with right thigh abscess.  Sensitive to methcillin.  She is  currently on antibiotics for this.  She is right handed.  No prior access procedures. She still has significant edema in both legs.  She was dialyzed several times via a temp cath which is now out.  Will need vein mapping [place].  If inadequate vein for fistula will need abscess resolved prior to placing graft.  Will be back to make plan for pt for access when vein map complete.  We will also place a diatek catheter at time of procedure.  Ruta Hinds, MD [facility] [place] Office: [phone] Pager: [phone]

## 2014-08-31 LAB — RENAL FUNCTION PANEL
ALBUMIN: 2 g/dL — AB (ref 3.5–5.0)
ANION GAP: 11 (ref 5–15)
BUN: 53 mg/dL — AB (ref 6–20)
CO2: 26 mmol/L (ref 22–32)
CREATININE: 7.04 mg/dL — AB (ref 0.44–1.00)
Calcium: 8.7 mg/dL — ABNORMAL LOW (ref 8.9–10.3)
Chloride: 96 mmol/L — ABNORMAL LOW (ref 101–111)
GFR calc Af Amer: 8 mL/min — ABNORMAL LOW (ref >60)
GFR, EST NON AFRICAN AMERICAN: 7 mL/min — AB (ref >60)
Glucose, Bld: 311 mg/dL — ABNORMAL HIGH (ref 65–99)
Phosphorus: 4.3 mg/dL (ref 2.5–4.6)
Potassium: 4.6 mmol/L (ref 3.5–5.1)
SODIUM: 133 mmol/L — AB (ref 135–145)

## 2014-08-31 LAB — GLUCOSE, CAPILLARY
GLUCOSE-CAPILLARY: 257 mg/dL — AB (ref 65–99)
Glucose-Capillary: 342 mg/dL — ABNORMAL HIGH (ref 65–99)
Glucose-Capillary: 156 mg/dL — ABNORMAL HIGH (ref 65–99)
Glucose-Capillary: 79 mg/dL (ref 65–99)

## 2014-08-31 MED ORDER — AMLODIPINE BESYLATE 5 MG PO TABS
5.0000 mg | ORAL_TABLET | Freq: Every day | ORAL | Status: DC
  Administered 2014-08-31 – 2014-09-01 (×2): 5 mg via ORAL
  Filled 2014-08-31 (×2): qty 1

## 2014-08-31 MED ORDER — BARRIER CREAM NON-SPECIFIED
1.0000 "application " | TOPICAL_CREAM | Freq: Two times a day (BID) | TOPICAL | Status: DC | PRN
  Filled 2014-08-31: qty 1

## 2014-08-31 MED ORDER — NEPRO/CARBSTEADY PO LIQD: 237.0000 mL | Freq: Every day | ORAL | Status: DC

## 2014-08-31 NOTE — Progress Notes (Signed)
Patient ID: Tracy [name], female   DOB: 02-Jun-1985, 29 y.o.   MRN: [id number]  [name] PROGRESS NOTE  CRISSY ANTILL [id number] DOB: 1985-05-30 DOA: 08/09/2014 PCP: [name]   Brief narrative:    29 year old Caucasian female with history of diabetes mellitus on insulin pump but poor control, hypothyroidism poor compliance she stopped taking her Synthroid 2 months ago due to insurance issues and medication affordability, chronic kidney disease stage IV baseline creatinine close to 3, who was admitted to the hospital with 2 week history of right arm and right leg pain, upon further workup with MRI was found that she had extensive myositis in both right-sided extremities. She was initially seen at [facility] in [place] and then transferred to [facility].  She was seen here by hand surgery, general surgery, orthopedic surgery, renal, infectious disease, phone consultation with rheumatologist Dr. Amil Amen. Initially no clear reason of myositis could be found, however since her clinical course did not show any improvement she was taken to the OR first by Dr. Amedeo Plenty, right arm fluid was drained which was suggestive of infectious myositis, she has been on IV antibiotics throughout her hospital stay. On 08/20/2014 she returned to the OR again by orthopedic surgery for drainage of right leg fluid collection. Her workup certainly suggests infectious myositis however reason for her bacteremia is not clear at all. ID is on board as well.  Note due to her illness patient's has progressed from chronic kidney disease stage IV. She received dialysis on 7/5 and 7/6, but not since then. Renal to decide whether this is ESRD or not. In addition, nursing noted on 7/10 morning that patient was hypoxic off of oxygen and requiring 3 L to stay above 90% with mild nonproductive cough and some low-grade temperatures. Chest x-ray done only noted mild pleural effusions but no infiltrate.  attempts have been made to aggressively diurese her and she's only had a moderate response. Her BUN and creatinine have subsequently risen and nephrology made decision to dialyze her again 7/14.   On 7/12, hemoglobin dropped below 7 so blood transfusion provided, patient herself doing better and  Wants to go home.  Assessment/Plan:    Mouth ulcer - resolving   Myositis of the right arm, right thigh with MSSA - Status post right arm muscle biopsy along with fluid drainage and JP drain as well as right leg ET guided drainage - Source of bacteremia remains unclear. Echocardiogram was normal.  - per hand surgeon, recommend to continue aggressive ROM passive stretch and active ROM, active assisted motion - per ID continue 2 more weeks of ABX, this will give pt total of 4 weeks for MSSA soft tissue infection - can switch to oral cephalexin orally once ready for d/c until 09/09/2014 - ID team has singed off 7/15 and the assistance greatly appreciated  - WBC is trending down   Active Problems:  Proliferative diabetic retinopathy - in the setting of poorly controlled DM - close outpatient follow up   Hypothyroidism - continue synthroid    Normocytic anemia from CKD and uncontrolled DM - iron deficiency and also in part from renal failure and chronic disease - Status post 2 units packed red blood cells transfusion on 7/6 and 7/12 - Hg stable in the past 72 hours, no sings of active bleeding  - also received Aranesp 7/10, continue once weekly    ESRD, secondary to diabetic nephropathy  - nephrology following  - Cr continues trending up, considered ESRD &  will need permanent access prior to discharge  - vein mapping for tomorrow     Acute respiratory failure with hypoxia secondary to volume overload  - Nursing noted that patient required at least 3 L of oxygen recently - Chest x-ray with no signs of infiltrate, but did note mild pleural effusions leading [place] to start diuresing her  aggressively.  - pt with no fever and WBC trending down  - volume control with HD   DM I (diabetes mellitus, type I), uncontrolled with renal disease - Poor outpatient control. A1c of 8.9. Currently on Lantus plus sliding scale - continue 20 U, meal coverage insulin up to 7 U - appreciate diabetic educator assistance    Essential hypertension - continue Metoprolol, Norvasc, Lasix     Hyponatremia - from renal failure - improving    Pain control - denies pain this AM - provide analgesia as needed    Obesity  - Body mass index is 32.43 kg/(m^2).  DVT prophylaxis - Heparin SQ  Code Status: Full.  Family Communication:  plan of care discussed with the patient and mother at bedside  Disposition Plan: Home when stable.   IV access:  Peripheral IV  Procedures and diagnostic studies:    7/03 Right arm muscle biopsy plus fluid drainage and JP drain placement of right arm  7/04 Right IJ dialysis catheter by interventional radiology  7/05 Dialysis session  7/06 Right leg ultrasound guided aspiration by interventional radiology  7/09 Leg expiration with drainage of fluid  7/06 and 7/12 Transfusion 2 units packed red blood cells  7/12 MR Humerus right WO contrast extensive myositis primarily involving the triceps and coracobrachialis muscles 7/12 MR Femur Right Wo Contrast severe muscle edema within the vastus medialis, vastus intermedius and sartorius  7/14/ CXR Moderate bilateral pleural effusions unchanged from 7/10  Medical Consultants:  Case discussed with rheumatology by phone. Interventional radiology. Infectious disease. Nephrology General surgery Hand surgery Orthopedic surgery  Other Consultants:  None   IAnti-Infectives:   IV vancomycin 6/28-7/5 IV Azactam 6/28-7/4 IV clindamycin 7/4-7/5 IV Ancef 7/6-present  Faye Ramsay, MD  [facility] Pager [phone]  If 7PM-7AM, please contact night-coverage [email or web address] Password [email or web address] 08/31/2014, 1:55 PM   LOS:  22 days   HPI/Subjective: No events overnight.   Objective: Filed Vitals:   08/30/14 1637 08/30/14 2103 08/31/14 0538 08/31/14 0802  BP: 120/64 126/69 139/70 128/65  Pulse:  81 75 78  Temp: 98.7 F (37.1 C) 98.9 F (37.2 C) 98.7 F (37.1 C) 98.5 F (36.9 C)  TempSrc: Oral Oral Oral Oral  Resp:  16 18 17   Height:      Weight:   85.2 kg (187 lb 13.3 oz)   SpO2: 96% 95% 95% 92%    Intake/Output Summary (Last 24 hours) at 08/31/14 1355 Last data filed at 08/31/14 1343  Gross per 24 hour  Intake    840 ml  Output   1900 ml  Net  -1060 ml    Exam:   General:  Pt is alert, follows commands appropriately, not in acute distress  Cardiovascular: Regular rate and rhythm, S1/S2, no murmurs, no rubs, no gallops  Respiratory: Clear to auscultation bilaterally, no wheezing, no crackles, no rhonchi  Abdomen: Soft, non tender, non distended, bowel sounds present, no guarding  Extremities: RUE still with edema, LE edema +3 up to thigh area   Data Reviewed: Basic Metabolic Panel:  Recent Labs Lab 08/27/14 0555 08/28/14 0555 08/29/14 0540 08/30/14 0546 08/31/14 0422  NA 133*  133* 135 132* 133*  K 4.3 4.4 4.4 4.7 4.6  CL 97* 98* 100* 96* 96*  CO2 25 27 25 24 26   GLUCOSE 401* 340* 332* 430* 311*  BUN 39* 42* 45* 50* 53*  CREATININE 5.96* 5.85* 6.32* 6.84* 7.04*  CALCIUM 8.1* 8.5* 8.7* 8.7* 8.7*  PHOS 3.7 3.7 3.9 4.4 4.3   Liver Function Tests:  Recent Labs Lab 08/27/14 0555 08/28/14 0555 08/29/14 0540 08/30/14 0546 08/31/14 0422  ALBUMIN 1.6* 1.5* 1.8* 2.0* 2.0*   CBC:  Recent Labs Lab 08/26/14 0656 08/27/14 0555 08/28/14 0555 08/29/14 0540 08/30/14 0546  WBC 12.7* 13.8* 15.2* 14.1* 13.7*  HGB 7.6* 7.8* 7.9* 8.1* 8.4*  HCT 23.9* 24.3* 24.8* 25.8* 27.1*  MCV 88.5 89.3 89.5 89.9 91.9  PLT 360 399 396 382 392   CBG:  Recent Labs Lab 08/30/14 1151 08/30/14 1635 08/30/14 2107 08/31/14 0736 08/31/14 1112  GLUCAP 290* 136* 183* 342* 156*     Scheduled Meds: . amLODipine  5 mg Oral QHS  .  ceFAZolin (ANCEF) IV  2 g Intravenous Q24H  . [START ON 09/04/2014] darbepoetin (ARANESP) injection - NON-DIALYSIS  200 mcg Subcutaneous Q Sun-1800  . feeding supplement (NEPRO CARB STEADY)  237 mL Oral BID BM  . furosemide  80 mg Oral BID  . heparin  5,000 Units Subcutaneous 3 times per day  . insulin aspart  0-20 Units Subcutaneous TID WC  . insulin aspart  7 Units Subcutaneous TID WC  . insulin glargine  20 Units Subcutaneous QHS  . levothyroxine  100 mcg Oral QAC breakfast  . megestrol  400 mg Oral Daily  . metoprolol tartrate  50 mg Oral BID  . multivitamin  1 tablet Oral QHS  . nystatin   Topical TID  . OxyCODONE  20 mg Oral Q12H  . polyethylene glycol  17 g Oral Daily  . sodium chloride  1,000 mL Intravenous Once  . sodium chloride  3 mL Intravenous Q12H   Continuous Infusions: . sodium chloride

## 2014-08-31 NOTE — Consult Note (Addendum)
Vein mapping reviewed.   Has adequate vein for left brachial cephalic AVF Plan discussed with pt and her mother   Dr Bridgett Larsson to place left arm AVF tomorrow as well as Diatek catheter Risk benefit possible complications discussed including but not limited to bleeding infection fistula non maturation ischemic steal NPO p midnight Consent  Pt to continue her current antibiotics.  She is on [place], MD [facility] [place] Office: [phone] Pager: [phone]

## 2014-08-31 NOTE — Progress Notes (Signed)
Subjective: Interval History: has complaints wants to get this done ASAP .  Objective: Vital signs in last 24 hours: Temp:  [98.5 F (36.9 C)-98.9 F (37.2 C)] 98.5 F (36.9 C) (07/20 0802) Pulse Rate:  [75-81] 78 (07/20 0802) Resp:  [16-18] 17 (07/20 0802) BP: (120-139)/(64-71) 128/65 mmHg (07/20 0802) SpO2:  [92 %-96 %] 92 % (07/20 0802) Weight:  [85.2 kg (187 lb 13.3 oz)] 85.2 kg (187 lb 13.3 oz) (07/20 0538) Weight change: 2.192 kg (4 lb 13.3 oz)  Intake/Output from previous day: 07/19 0701 - 07/20 0700 In: 580 [P.O.:580] Out: 1450 [Urine:1450] Intake/Output this shift: Total I/O In: -  Out: 950 [Urine:950]  General appearance: alert, cooperative, mildly obese and pale Resp: clear to auscultation bilaterally Cardio: S1, S2 normal and systolic murmur: holosystolic 2/6, blowing at apex GI: pos bs, liver down 4 cm Extremities: edema 3+, dressing RLE, incision RUE and mapping done  Lab Results:  Recent Labs  08/29/14 0540 08/30/14 0546  WBC 14.1* 13.7*  HGB 8.1* 8.4*  HCT 25.8* 27.1*  PLT 382 392   BMET:  Recent Labs  08/30/14 0546 08/31/14 0422  NA 132* 133*  K 4.7 4.6  CL 96* 96*  CO2 24 26  GLUCOSE 430* 311*  BUN 50* 53*  CREATININE 6.84* 7.04*  CALCIUM 8.7* 8.7*    Recent Labs  08/29/14 1031  PTH 36   Iron Studies:  Recent Labs  08/29/14 1031  IRON 59  TIBC 216*    Studies/Results: No results found.  I have reviewed the patient's current medications.  Assessment/Plan: 1 CKD 5 for access.  Vol xs.diuresing 2 Anemia esa/fe 3 HPTH meds 3 DM volatile, per primary 4 HTN coming down 5 hypothyroid 6 myositis resolving P access, start HD, lower bp meds., CLIP    LOS: 22 days   Tracy [name] L 08/31/2014,8:34 AM

## 2014-08-31 NOTE — Progress Notes (Signed)
Nutrition Follow-up  DOCUMENTATION CODES:   Not applicable  INTERVENTION:   Provide Nepro Shake po once daily, each supplement provides 425 kcal and 19 grams protein  Encourage adequate PO intake.   NUTRITION DIAGNOSIS:   Inadequate oral intake related to mouth pain, poor appetite as evidenced by meal completion < 50%; improving  GOAL:   Patient will meet greater than or equal to 90% of their needs; progressing  MONITOR:   PO intake, Supplement acceptance, Weight trends, Labs, I & O's  REASON FOR ASSESSMENT:   Consult Assessment of nutrition requirement/status  ASSESSMENT:   29 y.o. female with PMH of hypertension, diabetes type 1, hypothyroidism, anxiety, anemia, CKD-IV, who presents with right arm and upper leg pain.  Meal completion has been 75-100%. Mouth pains have resolved. RD to modify Nepro orders to once daily as po intake has improved. Plans for L arm AVF and Diatek catheter tomorrow.   Labs: Low sodium and GFR. High BUN and creatinine.   Diet Order:  Diet renal/carb modified with fluid restriction Diet-HS Snack?: Nothing; Room service appropriate?: Yes; Fluid consistency:: Thin Diet NPO time specified Except for: Sips with Meds  Skin:   (Incision on R arm and leg, +2 generalized edema)  Last BM:  7/18  Height:   Ht Readings from Last 1 Encounters:  08/10/14 5\' 3"  (1.6 m)    Weight:   Wt Readings from Last 1 Encounters:  08/31/14 187 lb 13.3 oz (85.2 kg)  Net I/O since admit: +5.8 L  Ideal Body Weight:  52.27 kg  Wt Readings from Last 10 Encounters:  08/31/14 187 lb 13.3 oz (85.2 kg)  08/08/14 151 lb (68.493 kg)  08/05/14 151 lb (68.493 kg)  07/05/14 147 lb 8 oz (66.906 kg)  01/20/14 141 lb 1.5 oz (64 kg)  06/25/13 140 lb (63.504 kg)  04/01/13 152 lb 9.6 oz (69.219 kg)  01/20/13 143 lb 15.4 oz (65.3 kg)  01/18/13 145 lb (65.772 kg)  01/06/13 141 lb (63.957 kg)    BMI:  Body mass index is 33.28 kg/(m^2).  Estimated Nutritional Needs:    Kcal:  1850-2100  Protein:  90-105 grams  Fluid:  1.2 L/day  EDUCATION NEEDS:   No education needs identified at this time  Corrin Parker, MS, RD, LDN Pager # [phone] After hours/ weekend pager # [phone]

## 2014-09-01 ENCOUNTER — Inpatient Hospital Stay (HOSPITAL_COMMUNITY): Payer: BLUE CROSS/BLUE SHIELD | Admitting: Certified Registered Nurse Anesthetist

## 2014-09-01 ENCOUNTER — Encounter (HOSPITAL_COMMUNITY): Payer: Self-pay | Admitting: Anesthesiology

## 2014-09-01 ENCOUNTER — Encounter (HOSPITAL_COMMUNITY)
Admission: EM | Disposition: A | Discharge status: Home Health | Payer: Self-pay | Source: Home / Self Care | Attending: Internal Medicine

## 2014-09-01 ENCOUNTER — Inpatient Hospital Stay (HOSPITAL_COMMUNITY): Payer: BLUE CROSS/BLUE SHIELD

## 2014-09-01 HISTORY — PX: AV FISTULA PLACEMENT: SHX1204

## 2014-09-01 HISTORY — PX: INSERTION OF DIALYSIS CATHETER: SHX1324

## 2014-09-01 LAB — GLUCOSE, CAPILLARY
GLUCOSE-CAPILLARY: 328 mg/dL — AB (ref 65–99)
GLUCOSE-CAPILLARY: 261 mg/dL — AB (ref 65–99)
Glucose-Capillary: 312 mg/dL — ABNORMAL HIGH (ref 65–99)
Glucose-Capillary: 115 mg/dL — ABNORMAL HIGH (ref 65–99)
Glucose-Capillary: 89 mg/dL (ref 65–99)
Glucose-Capillary: 75 mg/dL (ref 65–99)
Glucose-Capillary: 142 mg/dL — ABNORMAL HIGH (ref 65–99)

## 2014-09-01 LAB — RENAL FUNCTION PANEL
ALBUMIN: 2.1 g/dL — AB (ref 3.5–5.0)
Albumin: 2.1 g/dL — ABNORMAL LOW (ref 3.5–5.0)
Anion gap: 10 (ref 5–15)
Anion gap: 12 (ref 5–15)
BUN: 56 mg/dL — ABNORMAL HIGH (ref 6–20)
BUN: 56 mg/dL — ABNORMAL HIGH (ref 6–20)
CO2: 25 mmol/L (ref 22–32)
CO2: 24 mmol/L (ref 22–32)
Calcium: 8.9 mg/dL (ref 8.9–10.3)
Calcium: 8.7 mg/dL — ABNORMAL LOW (ref 8.9–10.3)
Chloride: 100 mmol/L — ABNORMAL LOW (ref 101–111)
Chloride: 100 mmol/L — ABNORMAL LOW (ref 101–111)
Creatinine, Ser: 7.22 mg/dL — ABNORMAL HIGH (ref 0.44–1.00)
Creatinine, Ser: 7.28 mg/dL — ABNORMAL HIGH (ref 0.44–1.00)
GFR calc Af Amer: 8 mL/min — ABNORMAL LOW (ref >60)
GFR calc non Af Amer: 7 mL/min — ABNORMAL LOW (ref >60)
GFR, EST AFRICAN AMERICAN: 8 mL/min — AB (ref >60)
GFR, EST NON AFRICAN AMERICAN: 7 mL/min — AB (ref >60)
GLUCOSE: 195 mg/dL — AB (ref 65–99)
Glucose, Bld: 299 mg/dL — ABNORMAL HIGH (ref 65–99)
PHOSPHORUS: 5.9 mg/dL — AB (ref 2.5–4.6)
POTASSIUM: 5.1 mmol/L (ref 3.5–5.1)
Phosphorus: 5.3 mg/dL — ABNORMAL HIGH (ref 2.5–4.6)
Potassium: 4.6 mmol/L (ref 3.5–5.1)
SODIUM: 135 mmol/L (ref 135–145)
Sodium: 136 mmol/L (ref 135–145)

## 2014-09-01 LAB — CBC
HCT: 28.8 % — ABNORMAL LOW (ref 36.0–46.0)
HEMATOCRIT: 28.8 % — AB (ref 36.0–46.0)
HEMOGLOBIN: 9.1 g/dL — AB (ref 12.0–15.0)
Hemoglobin: 8.8 g/dL — ABNORMAL LOW (ref 12.0–15.0)
MCH: 29.4 pg (ref 26.0–34.0)
MCH: 28.4 pg (ref 26.0–34.0)
MCHC: 31.6 g/dL (ref 30.0–36.0)
MCHC: 30.6 g/dL (ref 30.0–36.0)
MCV: 92.9 fL (ref 78.0–100.0)
MCV: 92.9 fL (ref 78.0–100.0)
PLATELETS: 391 10*3/uL (ref 150–400)
Platelets: 398 10*3/uL (ref 150–400)
RBC: 3.1 MIL/uL — ABNORMAL LOW (ref 3.87–5.11)
RBC: 3.1 MIL/uL — AB (ref 3.87–5.11)
RDW: 18.5 % — AB (ref 11.5–15.5)
RDW: 18.8 % — ABNORMAL HIGH (ref 11.5–15.5)
WBC: 11.2 10*3/uL — ABNORMAL HIGH (ref 4.0–10.5)
WBC: 13.6 10*3/uL — AB (ref 4.0–10.5)

## 2014-09-01 LAB — SURGICAL PCR SCREEN
MRSA, PCR: NEGATIVE
Staphylococcus aureus: NEGATIVE

## 2014-09-01 SURGERY — ARTERIOVENOUS (AV) FISTULA CREATION
Anesthesia: General | Site: Neck

## 2014-09-01 MED ORDER — DEXAMETHASONE SODIUM PHOSPHATE 4 MG/ML IJ SOLN
INTRAMUSCULAR | Status: AC
  Filled 2014-09-01: qty 1

## 2014-09-01 MED ORDER — SODIUM CHLORIDE 0.9 % IV SOLN: 100.0000 mL | INTRAVENOUS | Status: DC | PRN

## 2014-09-01 MED ORDER — LIDOCAINE HCL 1 % IJ SOLN
INTRAMUSCULAR | Status: DC | PRN
  Administered 2014-09-01: 30 mL

## 2014-09-01 MED ORDER — PHENYLEPHRINE HCL 10 MG/ML IJ SOLN
INTRAMUSCULAR | Status: DC | PRN
  Administered 2014-09-01 (×2): 40 ug via INTRAVENOUS

## 2014-09-01 MED ORDER — DEXAMETHASONE SODIUM PHOSPHATE 4 MG/ML IJ SOLN
INTRAMUSCULAR | Status: DC | PRN
  Administered 2014-09-01: 4 mg via INTRAVENOUS

## 2014-09-01 MED ORDER — METOPROLOL TARTRATE 25 MG PO TABS
25.0000 mg | ORAL_TABLET | Freq: Two times a day (BID) | ORAL | Status: DC
  Administered 2014-09-01: 25 mg via ORAL
  Filled 2014-09-01 (×2): qty 1

## 2014-09-01 MED ORDER — 0.9 % SODIUM CHLORIDE (POUR BTL) OPTIME
TOPICAL | Status: DC | PRN
  Administered 2014-09-01: 1000 mL

## 2014-09-01 MED ORDER — HYDROMORPHONE HCL 1 MG/ML IJ SOLN
INTRAMUSCULAR | Status: AC
  Filled 2014-09-01: qty 1

## 2014-09-01 MED ORDER — HEPARIN SODIUM (PORCINE) 1000 UNIT/ML IJ SOLN
INTRAMUSCULAR | Status: AC
  Filled 2014-09-01: qty 1

## 2014-09-01 MED ORDER — CALCITRIOL 0.5 MCG PO CAPS
0.5000 ug | ORAL_CAPSULE | Freq: Every day | ORAL | Status: DC
  Administered 2014-09-02: 0.5 ug via ORAL
  Filled 2014-09-01: qty 1

## 2014-09-01 MED ORDER — PROPOFOL 10 MG/ML IV BOLUS
INTRAVENOUS | Status: DC | PRN
  Administered 2014-09-01: 140 mg via INTRAVENOUS

## 2014-09-01 MED ORDER — ONDANSETRON HCL 4 MG/2ML IJ SOLN
INTRAMUSCULAR | Status: AC
  Filled 2014-09-01: qty 2

## 2014-09-01 MED ORDER — HEPARIN SODIUM (PORCINE) 1000 UNIT/ML IJ SOLN
INTRAMUSCULAR | Status: DC | PRN
  Administered 2014-09-01: 4.6 mL via INTRAVENOUS

## 2014-09-01 MED ORDER — SODIUM CHLORIDE 0.9 % IR SOLN
Status: DC | PRN
  Administered 2014-09-01: 15:00:00

## 2014-09-01 MED ORDER — HEPARIN SODIUM (PORCINE) 1000 UNIT/ML DIALYSIS
100.0000 [IU]/kg | INTRAMUSCULAR | Status: DC | PRN
  Filled 2014-09-01: qty 9

## 2014-09-01 MED ORDER — MIDAZOLAM HCL 2 MG/2ML IJ SOLN
INTRAMUSCULAR | Status: AC
  Filled 2014-09-01: qty 2

## 2014-09-01 MED ORDER — NEPRO/CARBSTEADY PO LIQD
237.0000 mL | ORAL | Status: DC | PRN
Start: 2014-09-01 — End: 2014-09-01

## 2014-09-01 MED ORDER — LIDOCAINE-PRILOCAINE 2.5-2.5 % EX CREA
1.0000 "application " | TOPICAL_CREAM | CUTANEOUS | Status: DC | PRN
  Filled 2014-09-01: qty 5

## 2014-09-01 MED ORDER — PENTAFLUOROPROP-TETRAFLUOROETH EX AERO: 1.0000 "application " | INHALATION_SPRAY | CUTANEOUS | Status: DC | PRN

## 2014-09-01 MED ORDER — ONDANSETRON HCL 4 MG/2ML IJ SOLN: 4.0000 mg | Freq: Once | INTRAMUSCULAR | Status: DC | PRN

## 2014-09-01 MED ORDER — HEPARIN SODIUM (PORCINE) 1000 UNIT/ML DIALYSIS: 1000.0000 [IU] | INTRAMUSCULAR | Status: DC | PRN

## 2014-09-01 MED ORDER — HYDROMORPHONE HCL 1 MG/ML IJ SOLN
0.2500 mg | INTRAMUSCULAR | Status: DC | PRN
  Administered 2014-09-01 (×2): 0.5 mg via INTRAVENOUS
  Filled 2014-09-01 (×2): qty 1

## 2014-09-01 MED ORDER — FENTANYL CITRATE (PF) 250 MCG/5ML IJ SOLN
INTRAMUSCULAR | Status: AC
  Filled 2014-09-01: qty 5

## 2014-09-01 MED ORDER — ALTEPLASE 2 MG IJ SOLR
2.0000 mg | Freq: Once | INTRAMUSCULAR | Status: DC | PRN
  Filled 2014-09-01: qty 2

## 2014-09-01 MED ORDER — FENTANYL CITRATE (PF) 100 MCG/2ML IJ SOLN
INTRAMUSCULAR | Status: DC | PRN
  Administered 2014-09-01: 25 ug via INTRAVENOUS
  Administered 2014-09-01: 100 ug via INTRAVENOUS
  Administered 2014-09-01 (×2): 25 ug via INTRAVENOUS

## 2014-09-01 MED ORDER — THROMBIN 20000 UNITS EX SOLR
CUTANEOUS | Status: AC
  Filled 2014-09-01: qty 20000

## 2014-09-01 MED ORDER — LIDOCAINE HCL (PF) 1 % IJ SOLN
INTRAMUSCULAR | Status: AC
  Filled 2014-09-01: qty 30

## 2014-09-01 MED ORDER — SODIUM CHLORIDE 0.9 % IV SOLN
510.0000 mg | Freq: Once | INTRAVENOUS | Status: AC
  Administered 2014-09-01: 510 mg via INTRAVENOUS
  Filled 2014-09-01 (×2): qty 17

## 2014-09-01 MED ORDER — MIDAZOLAM HCL 5 MG/5ML IJ SOLN
INTRAMUSCULAR | Status: DC | PRN
  Administered 2014-09-01: 2 mg via INTRAVENOUS

## 2014-09-01 MED ORDER — LIDOCAINE HCL (PF) 1 % IJ SOLN: 5.0000 mL | INTRAMUSCULAR | Status: DC | PRN

## 2014-09-01 SURGICAL SUPPLY — 57 items
ARMBAND PINK RESTRICT EXTREMIT (MISCELLANEOUS) ×3 IMPLANT
BAG BANDED W/RUBBER/TAPE 36X54 (MISCELLANEOUS) ×1 IMPLANT
BAG EQP BAND 135X91 W/RBR TAPE (MISCELLANEOUS) ×2
BIOPATCH RED 1 DISK 7.0 (GAUZE/BANDAGES/DRESSINGS) ×3 IMPLANT
CANISTER SUCTION 2500CC (MISCELLANEOUS) ×3 IMPLANT
CATH CANNON HEMO 15FR 23CM (HEMODIALYSIS SUPPLIES) ×1 IMPLANT
CATH STRAIGHT 5FR 65CM (CATHETERS) IMPLANT
CLIP TI MEDIUM 6 (CLIP) ×3 IMPLANT
CLIP TI WIDE RED SMALL 6 (CLIP) ×3 IMPLANT
COVER PROBE W GEL 5X96 (DRAPES) ×3 IMPLANT
DECANTER SPIKE VIAL GLASS SM (MISCELLANEOUS) ×3 IMPLANT
DRAPE C-ARM 42X72 X-RAY (DRAPES) ×3 IMPLANT
DRAPE CHEST BREAST 15X10 FENES (DRAPES) ×3 IMPLANT
ELECT REM PT RETURN 9FT ADLT (ELECTROSURGICAL) ×3
ELECTRODE REM PT RTRN 9FT ADLT (ELECTROSURGICAL) ×2 IMPLANT
GAUZE SPONGE 2X2 8PLY NS (GAUZE/BANDAGES/DRESSINGS) ×1 IMPLANT
GAUZE SPONGE 2X2 8PLY STRL LF (GAUZE/BANDAGES/DRESSINGS) ×2 IMPLANT
GAUZE SPONGE 4X4 16PLY XRAY LF (GAUZE/BANDAGES/DRESSINGS) ×3 IMPLANT
GLOVE BIO SURGEON STRL SZ 6.5 (GLOVE) ×4 IMPLANT
GLOVE BIO SURGEON STRL SZ7 (GLOVE) ×3 IMPLANT
GLOVE BIOGEL PI IND STRL 6.5 (GLOVE) IMPLANT
GLOVE BIOGEL PI IND STRL 7.5 (GLOVE) ×2 IMPLANT
GLOVE BIOGEL PI INDICATOR 6.5 (GLOVE) ×2
GLOVE BIOGEL PI INDICATOR 7.5 (GLOVE) ×1
GLOVE SURG SS PI 7.0 STRL IVOR (GLOVE) ×1 IMPLANT
GOWN STRL REUS W/ TWL LRG LVL3 (GOWN DISPOSABLE) ×6 IMPLANT
GOWN STRL REUS W/TWL LRG LVL3 (GOWN DISPOSABLE) ×12
KIT BASIN OR (CUSTOM PROCEDURE TRAY) ×3 IMPLANT
KIT ROOM TURNOVER OR (KITS) ×3 IMPLANT
LIQUID BAND (GAUZE/BANDAGES/DRESSINGS) ×4 IMPLANT
NDL 18GX1X1/2 (RX/OR ONLY) (NEEDLE) ×2 IMPLANT
NDL HYPO 25GX1X1/2 BEV (NEEDLE) ×2 IMPLANT
NEEDLE 18GX1X1/2 (RX/OR ONLY) (NEEDLE) ×6 IMPLANT
NEEDLE HYPO 25GX1X1/2 BEV (NEEDLE) ×3 IMPLANT
NS IRRIG 1000ML POUR BTL (IV SOLUTION) ×3 IMPLANT
PACK CV ACCESS (CUSTOM PROCEDURE TRAY) ×3 IMPLANT
PACK SURGICAL SETUP 50X90 (CUSTOM PROCEDURE TRAY) ×2 IMPLANT
PAD ARMBOARD 7.5X6 YLW CONV (MISCELLANEOUS) ×6 IMPLANT
SET MICROPUNCTURE 5F STIFF (MISCELLANEOUS) IMPLANT
SOAP 2 % CHG 4 OZ (WOUND CARE) ×3 IMPLANT
SPONGE GAUZE 2X2 STER 10/PKG (GAUZE/BANDAGES/DRESSINGS)
SPONGE SURGIFOAM ABS GEL 100 (HEMOSTASIS) IMPLANT
SUT ETHILON 3 0 PS 1 (SUTURE) ×3 IMPLANT
SUT MNCRL AB 4-0 PS2 18 (SUTURE) ×4 IMPLANT
SUT PROLENE 6 0 BV (SUTURE) ×1 IMPLANT
SUT PROLENE 7 0 BV 1 (SUTURE) ×3 IMPLANT
SUT VIC AB 3-0 SH 27 (SUTURE) ×3
SUT VIC AB 3-0 SH 27X BRD (SUTURE) ×2 IMPLANT
SYR 20CC LL (SYRINGE) ×5 IMPLANT
SYR 3ML LL SCALE MARK (SYRINGE) ×3 IMPLANT
SYR 5ML LL (SYRINGE) ×3 IMPLANT
SYR CONTROL 10ML LL (SYRINGE) ×2 IMPLANT
SYRINGE 10CC LL (SYRINGE) ×4 IMPLANT
TAPE CLOTH SURG 4X10 WHT LF (GAUZE/BANDAGES/DRESSINGS) ×1 IMPLANT
UNDERPAD 30X30 INCONTINENT (UNDERPADS AND DIAPERS) ×3 IMPLANT
WATER STERILE IRR 1000ML POUR (IV SOLUTION) ×3 IMPLANT
WIRE AMPLATZ SS-J .035X180CM (WIRE) IMPLANT

## 2014-09-01 NOTE — Anesthesia Postprocedure Evaluation (Signed)
  Anesthesia Post-op Note  Patient: Tracy [name]  Procedure(s) Performed: Procedure(s): BRACHIOCEPHALIC ARTERIOVENOUS (AV) FISTULA CREATION (Left) INSERTION OF DIALYSIS CATHETER IN RIGHT INTERNAL JUGUILAR (N/A)  Patient Location: PACU  Anesthesia Type:MAC  Level of Consciousness: awake, alert , oriented and patient cooperative  Airway and Oxygen Therapy: Patient Spontanous Breathing  Post-op Pain: mild  Post-op Assessment: Post-op Vital signs reviewed, Patient's Cardiovascular Status Stable, Respiratory Function Stable, Patent Airway, No signs of Nausea or vomiting and Pain level controlled              Post-op Vital Signs: stable  Last Vitals:  Filed Vitals:   09/01/14 1715  BP:   Pulse: 74  Temp:   Resp: 16    Complications: No apparent anesthesia complications

## 2014-09-01 NOTE — Progress Notes (Signed)
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 Penicillins Hives and Swelling  . Sulfa Antibiotics Hives    Patient Measurements: Height: 5\' 3"  (160 cm) Weight: 186 lb 11.7 oz (84.7 kg) IBW/kg (Calculated) : 52.4 Adjusted Body Weight: n/a  Vital Signs: Temp: 98.5 F (36.9 C) (07/21 0859) Temp Source: Oral (07/21 0859) BP: 150/72 mmHg (07/21 0859) Pulse Rate: 79 (07/21 0859) Intake/Output from previous day: 07/20 0701 - 07/21 0700 In: 720 [P.O.:720] Out: 2100 [Urine:2100] Intake/Output from this shift: Total I/O In: 0  Out: 600 [Urine:600]  Labs:  Recent Labs  08/30/14 0546 08/31/14 0422 09/01/14 0518  WBC 13.7*  --  11.2*  HGB 8.4*  --  9.1*  PLT 392  --  398  CREATININE 6.84* 7.04* 7.22*   Estimated Creatinine Clearance: 11.7 mL/min (by C-G formula based on Cr of 7.22). No results for input(s): VANCOTROUGH, VANCOPEAK, VANCORANDOM, GENTTROUGH, GENTPEAK, GENTRANDOM, TOBRATROUGH, TOBRAPEAK, TOBRARND, AMIKACINPEAK, AMIKACINTROU, AMIKACIN in the last 72 hours.   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 Ancef 2 gm IV q24, to be given after HD on HD days. Afebrile, Tm 99.1, WBC 11.2K <13.7K, SCr 7.22 <7.04,  BUN 56 < 53. New HD pt - ARF on CKD.  Unclear if she has progressed to ESRD. To be assessed daily for HD needs/plans. Nephrologist notes CKD stage 5 for PC and fistula today and for hemodialysis today. No new cultures  Note that pt with hx of allergic rxn to PCN: per pt she had urticaria to PCN in the 9th grade,  no respiratory distress.  She got cefepime 2 mg x 1 on 01/20/2014 with no reaction. First dose of ancef on 7/6  with no allergic rxn per RN report.  Ancef continues without adverse effect noted.   7/3 debridement in OR 7/6 R to biopsy and aspirate muscle of R thigh  vanc 6/26 >>7/6    7/5 VR prior to pt's first HD = 29 mcg/ml (goal typically 15-25 mcg/ml prior to HD).  aztreonam 6/28 >>7/4 clinda 7/4 >>7/5 ancef 7/6 >>   (abx to continue thru 09/09/14)  Plan: -Ancef 2 gm IV q24, give after HD on HD days. If no HD and renal function improves, will need to reassess dosing -f/u for HD sessions / changes  Thank you for allowing pharmacy to be part of this patients care team. Nicole Cella, RPh Clinical Pharmacist Pager: [phone]  09/01/2014 10:04 AM

## 2014-09-01 NOTE — Interval H&P Note (Signed)
[facility] [place]  History and Physical Update  The patient was interviewed and re-examined.  The patient's previous History and Physical has been reviewed and is unchanged from Dr. Nona Dell consult.  There is no change in the plan of care: [facility] placement, L BC AVF.  Risk, benefits, and alternatives to access surgery were discussed.  The patient is aware the risks include but are not limited to: bleeding, infection, steal syndrome, nerve damage, ischemic monomelic neuropathy, failure to mature, need for additional procedures, death and stroke.  The patient is aware the risks of tunneled dialysis catheter placement include but are not limited to: bleeding, infection, central venous injury, pneumothorax, possible venous stenosis, possible malpositioning in the venous system, and possible infections related to long-term catheter presence. The patient was aware of these risks and agreed to proceed. Adele Barthel, MD [facility] [place] Office: [phone] Pager: [phone]  09/01/2014, 2:05 PM

## 2014-09-01 NOTE — Progress Notes (Signed)
Pt returned from the OR. Pt lethargic but will arouse. VS stable. Fistula site clean dry and intact with adhesive glue dressing. Bruit and thrill present. HD catheter placement site on right chest dressing clean, dry, intact. Family at bedside. Will continue to monitor.

## 2014-09-01 NOTE — Progress Notes (Addendum)
Hemodialysis- Pt tolerated treatment well. New catheter-difficulty with flows during last hour of treatment. Treatment ended with 20 minutes remaining d/t VP 400 even with flushing manually q 5 minutes. Unable to meet goal d/t system clotting x1 (less than 100cc blood loss). Total UF 1.9L without issue. Catheter care explained to patient. States good understanding. Tolerated clear liquids and graham crackers. Report given to primary RN.

## 2014-09-01 NOTE — Progress Notes (Signed)
Subjective: Interval History: has complaints still xs fluid.  Objective: Vital signs in last 24 hours: Temp:  [98.5 F (36.9 C)-99.1 F (37.3 C)] 98.5 F (36.9 C) (07/21 0859) Pulse Rate:  [72-79] 79 (07/21 0859) Resp:  [17-18] 18 (07/21 0859) BP: (103-150)/(63-72) 150/72 mmHg (07/21 0859) SpO2:  [97 %-100 %] 100 % (07/21 0859) Weight:  [84.7 kg (186 lb 11.7 oz)] 84.7 kg (186 lb 11.7 oz) (07/21 0500) Weight change: -0.5 kg (-1 lb 1.6 oz)  Intake/Output from previous day: 07/20 0701 - 07/21 0700 In: 720 [P.O.:720] Out: 2100 [Urine:2100] Intake/Output this shift:    General appearance: alert, cooperative and no distress Resp: rales bibasilar Cardio: S1, S2 normal and systolic murmur: holosystolic 2/6, blowing at apex GI: pos bs,liver down 5 cm Extremities: edema 3+  Lab Results:  Recent Labs  08/30/14 0546 09/01/14 0518  WBC 13.7* 11.2*  HGB 8.4* 9.1*  HCT 27.1* 28.8*  PLT 392 398   BMET:  Recent Labs  08/31/14 0422 09/01/14 0518  NA 133* 135  K 4.6 4.6  CL 96* 100*  CO2 26 25  GLUCOSE 311* 299*  BUN 53* 56*  CREATININE 7.04* 7.22*  CALCIUM 8.7* 8.9    Recent Labs  08/29/14 1031  PTH 36   Iron Studies:  Recent Labs  08/29/14 1031  IRON 59  TIBC 216*    Studies/Results: No results found.  I have reviewed the patient's current medications.  Assessment/Plan: 1 CKD 5 for PC and fistula, will dialyze, CLIP and see if can d/c.  Vol xs yet and diuresing 2 HTn eval with vol lower 3 Anemia esa/Fe 4 HPTH 5 DM volatile 6 pyomysitis P PC, HD, esa, Clip, vit D    LOS: 23 days   Deshanae Lindo L 09/01/2014,9:03 AM

## 2014-09-01 NOTE — Anesthesia Preprocedure Evaluation (Addendum)
Anesthesia Evaluation  Patient identified by MRN, date of birth, ID band Patient awake    Reviewed: Allergy & Precautions, NPO status , Patient's Chart, lab work & pertinent test results  Airway Mallampati: I       Dental  (+) Teeth Intact, Dental Advisory Given   Pulmonary    Pulmonary exam normal       Cardiovascular hypertension, Normal cardiovascular exam    Neuro/Psych Seizures -,  Anxiety  Neuromuscular disease    GI/Hepatic   Endo/Other  diabetes, Type 1, Insulin DependentHypothyroidism   Renal/GU ESRF, Dialysis and CRFRenal disease     Musculoskeletal   Abdominal   Peds  Hematology  (+) anemia ,   Anesthesia Other Findings   Reproductive/Obstetrics                           Anesthesia Physical Anesthesia Plan  ASA: III  Anesthesia Plan: General   Post-op Pain Management:    Induction: Intravenous  Airway Management Planned: LMA  Additional Equipment:   Intra-op Plan:   Post-operative Plan: Extubation in OR  Informed Consent: I have reviewed the patients History and Physical, chart, labs and discussed the procedure including the risks, benefits and alternatives for the proposed anesthesia with the patient or authorized representative who has indicated his/her understanding and acceptance.     Plan Discussed with: CRNA, Anesthesiologist and Surgeon  Anesthesia Plan Comments:         Anesthesia Quick Evaluation

## 2014-09-01 NOTE — Transfer of Care (Signed)
Immediate Anesthesia Transfer of Care Note  Patient: Tracy [name]  Procedure(s) Performed: Procedure(s): BRACHIOCEPHALIC ARTERIOVENOUS (AV) FISTULA CREATION (Left) INSERTION OF DIALYSIS CATHETER IN RIGHT INTERNAL JUGUILAR (N/A)  Patient Location: PACU  Anesthesia Type:General  Level of Consciousness: awake, alert  and oriented  Airway & Oxygen Therapy: Patient Spontanous Breathing and Patient connected to nasal cannula oxygen  Post-op Assessment: Report given to RN and Post -op Vital signs reviewed and stable  Post vital signs: Reviewed and stable  Last Vitals:  Filed Vitals:   09/01/14 0859  BP: 150/72  Pulse: 79  Temp: 36.9 C  Resp: 18    Complications: No apparent anesthesia complications

## 2014-09-01 NOTE — Progress Notes (Signed)
Patient ID: Tracy [name], female   DOB: 1985-10-21, 29 y.o.   MRN: [id number]  [name] PROGRESS NOTE  Tracy [name] [id number] DOB: November 10, 1985 DOA: 08/09/2014 PCP: [name]   Brief narrative:    29 year old Caucasian female with history of diabetes mellitus on insulin pump but poor control, hypothyroidism poor compliance she stopped taking her Synthroid 2 months ago due to insurance issues and medication affordability, chronic kidney disease stage IV baseline creatinine close to 3, who was admitted to the hospital with 2 week history of right arm and right leg pain, upon further workup with MRI was found that she had extensive myositis in both right-sided extremities. She was initially seen at [facility] in [place] and then transferred to [facility].  She was seen here by hand surgery, general surgery, orthopedic surgery, renal, infectious disease, phone consultation with rheumatologist Dr. Amil Amen. Initially no clear reason of myositis could be found, however since her clinical course did not show any improvement she was taken to the OR first by Dr. Amedeo Plenty, right arm fluid was drained which was suggestive of infectious myositis, she has been on IV antibiotics throughout her hospital stay. On 08/20/2014 she returned to the OR again by orthopedic surgery for drainage of right leg fluid collection. Her workup certainly suggests infectious myositis however reason for her bacteremia is not clear at all. ID is on board as well.  Note due to her illness patient's has progressed from chronic kidney disease stage IV. She received dialysis on 7/5 and 7/6, but not since then. Renal to decide whether this is ESRD or not. In addition, nursing noted on 7/10 morning that patient was hypoxic off of oxygen and requiring 3 L to stay above 90% with mild nonproductive cough and some low-grade temperatures. Chest x-ray done only noted mild pleural effusions but no infiltrate.  attempts have been made to aggressively diurese her and she's only had a moderate response. Her BUN and creatinine have subsequently risen and nephrology made decision to dialyze her again 7/14.   On 7/12, hemoglobin dropped below 7 so blood transfusion provided, patient herself doing better and  Wants to go home.  Assessment/Plan:    Mouth ulcer - resolving   Myositis of the right arm, right thigh with MSSA - Status post right arm muscle biopsy along with fluid drainage and JP drain as well as right leg ET guided drainage - Source of bacteremia remains unclear. Echocardiogram was normal.  - per hand surgeon, recommend to continue aggressive ROM passive stretch and active ROM, active assisted motion - per ID continue 2 more weeks of ABX, this will give pt total of 4 weeks for MSSA soft tissue infection - can switch to oral cephalexin orally once ready for d/c until 09/09/2014 - ID team has singed off 7/15 and the assistance greatly appreciated  - WBC continue trending down   Active Problems:  Proliferative diabetic retinopathy - in the setting of poorly controlled DM - close outpatient follow up   Hypothyroidism - continue synthroid    Normocytic anemia from CKD and uncontrolled DM - iron deficiency and also in part from renal failure and chronic disease - Status post 2 units packed red blood cells transfusion on 7/6 and 7/12 - Hg stable in the past 72 hours, no sings of active bleeding  - also received Aranesp 7/10, continue once weekly    ESRD, secondary to diabetic nephropathy  - nephrology following  - Cr continues trending up, considered ESRD -  vein mapping for today and placement of permanent access     Acute respiratory failure with hypoxia secondary to volume overload  - Nursing noted that patient required at least 3 L of oxygen recently - Chest x-ray with no signs of infiltrate, but did note mild pleural effusions leading [place] to start diuresing her aggressively.   - pt with no fever and WBC trending down  - volume control with HD   DM I (diabetes mellitus, type I), uncontrolled with renal disease - Poor outpatient control. A1c of 8.9. Currently on Lantus plus sliding scale - continue 20 U, meal coverage insulin up to 7 U - appreciate diabetic educator assistance    Essential hypertension - continue Metoprolol, Norvasc, Lasix     Hyponatremia - from renal failure - improving    Pain control - denies pain this AM - provide analgesia as needed    Obesity  - Body mass index is 32.43 kg/(m^2).  DVT prophylaxis - Heparin SQ  Code Status: Full.  Family Communication:  plan of care discussed with the patient and mother at bedside  Disposition Plan: Home when stable.   IV access:  Peripheral IV  Procedures and diagnostic studies:    7/03 Right arm muscle biopsy plus fluid drainage and JP drain placement of right arm  7/04 Right IJ dialysis catheter by interventional radiology  7/05 Dialysis session  7/06 Right leg ultrasound guided aspiration by interventional radiology  7/09 Leg expiration with drainage of fluid  7/06 and 7/12 Transfusion 2 units packed red blood cells  7/12 MR Humerus right WO contrast extensive myositis primarily involving the triceps and coracobrachialis muscles 7/12 MR Femur Right Wo Contrast severe muscle edema within the vastus medialis, vastus intermedius and sartorius  7/14/ CXR Moderate bilateral pleural effusions unchanged from 7/10  Medical Consultants:  Case discussed with rheumatology by phone. Interventional radiology. Infectious disease. Nephrology General surgery Hand surgery Orthopedic surgery  Other Consultants:  None   IAnti-Infectives:   IV vancomycin 6/28-7/5 IV Azactam 6/28-7/4 IV clindamycin 7/4-7/5 IV Ancef 7/6-present  Faye Ramsay, MD  [facility] Pager [phone]  If 7PM-7AM, please contact night-coverage [email or web address] Password [email or web address] 09/01/2014, 1:15 PM   LOS: 23 days    HPI/Subjective: No events overnight.   Objective: Filed Vitals:   08/31/14 1611 08/31/14 2100 09/01/14 0500 09/01/14 0859  BP: 103/63 128/69 128/68 150/72  Pulse: 72 75 75 79  Temp: 99 F (37.2 C) 99 F (37.2 C) 99.1 F (37.3 C) 98.5 F (36.9 C)  TempSrc: Oral Oral Oral Oral  Resp: 17 18 18 18   Height:      Weight:   84.7 kg (186 lb 11.7 oz)   SpO2: 99% 98% 97% 100%    Intake/Output Summary (Last 24 hours) at 09/01/14 1315 Last data filed at 09/01/14 [id number]  Gross per 24 hour  Intake    480 ml  Output   1500 ml  Net  -1020 ml    Exam:   General:  Pt is alert, follows commands appropriately, not in acute distress  Cardiovascular: Regular rate and rhythm, S1/S2, no murmurs, no rubs, no gallops  Respiratory: Clear to auscultation bilaterally, no wheezing, no crackles, no rhonchi  Abdomen: Soft, non tender, non distended, bowel sounds present, no guarding  Extremities: RUE still with edema, LE edema +3 up to thigh area   Data Reviewed: Basic Metabolic Panel:  Recent Labs Lab 08/28/14 0555 08/29/14 0540 08/30/14 0546 08/31/14 0422 09/01/14 0518  NA 133* 135 132* 133* 135  K 4.4 4.4 4.7 4.6 4.6  CL 98* 100* 96* 96* 100*  CO2 27 25 24 26 25   GLUCOSE 340* 332* 430* 311* 299*  BUN 42* 45* 50* 53* 56*  CREATININE 5.85* 6.32* 6.84* 7.04* 7.22*  CALCIUM 8.5* 8.7* 8.7* 8.7* 8.9  PHOS 3.7 3.9 4.4 4.3 5.3*   Liver Function Tests:  Recent Labs Lab 08/28/14 0555 08/29/14 0540 08/30/14 0546 08/31/14 0422 09/01/14 0518  ALBUMIN 1.5* 1.8* 2.0* 2.0* 2.1*   CBC:  Recent Labs Lab 08/27/14 0555 08/28/14 0555 08/29/14 0540 08/30/14 0546 09/01/14 0518  WBC 13.8* 15.2* 14.1* 13.7* 11.2*  HGB 7.8* 7.9* 8.1* 8.4* 9.1*  HCT 24.3* 24.8* 25.8* 27.1* 28.8*  MCV 89.3 89.5 89.9 91.9 92.9  PLT 399 396 382 392 398   CBG:  Recent Labs Lab 08/31/14 1609 08/31/14 2132 09/01/14 0727 09/01/14 0859 09/01/14 1214  GLUCAP 79 257* 328* 312* 115*    Scheduled  Meds: . [MAR Hold] amLODipine  5 mg Oral QHS  . [MAR Hold] calcitRIOL  0.5 mcg Oral Daily  . [MAR Hold]  ceFAZolin (ANCEF) IV  2 g Intravenous Q24H  . [MAR Hold] darbepoetin (ARANESP) injection - NON-DIALYSIS  200 mcg Subcutaneous Q Sun-1800  . [MAR Hold] feeding supplement (NEPRO CARB STEADY)  237 mL Oral Q1500  . ferumoxytol  510 mg Intravenous Once  . [MAR Hold] heparin  5,000 Units Subcutaneous 3 times per day  . [MAR Hold] insulin aspart  0-20 Units Subcutaneous TID WC  . [MAR Hold] insulin aspart  7 Units Subcutaneous TID WC  . [MAR Hold] insulin glargine  20 Units Subcutaneous QHS  . [MAR Hold] levothyroxine  100 mcg Oral QAC breakfast  . [MAR Hold] megestrol  400 mg Oral Daily  . [MAR Hold] metoprolol tartrate  25 mg Oral BID  . [MAR Hold] multivitamin  1 tablet Oral QHS  . [MAR Hold] nystatin   Topical TID  . [MAR Hold] OxyCODONE  20 mg Oral Q12H  . [MAR Hold] polyethylene glycol  17 g Oral Daily  . [MAR Hold] sodium chloride  1,000 mL Intravenous Once  . [MAR Hold] sodium chloride  3 mL Intravenous Q12H   Continuous Infusions: . sodium chloride

## 2014-09-01 NOTE — Anesthesia Procedure Notes (Signed)
Procedure Name: LMA Insertion Date/Time: 09/01/2014 2:25 PM Performed by: Ollen Bowl Pre-anesthesia Checklist: Patient identified, Emergency Drugs available, Suction available, Patient being monitored and Timeout performed Patient Re-evaluated:Patient Re-evaluated prior to inductionOxygen Delivery Method: Circle system utilized and Simple face mask Preoxygenation: Pre-oxygenation with 100% oxygen Intubation Type: IV induction Ventilation: Mask ventilation without difficulty LMA: LMA inserted LMA Size: 4.0 Number of attempts: 1 Airway Equipment and Method: Patient positioned with wedge pillow Placement Confirmation: positive ETCO2 and breath sounds checked- equal and bilateral Tube secured with: Tape Dental Injury: Teeth and Oropharynx as per pre-operative assessment

## 2014-09-01 NOTE — Discharge Instructions (Signed)
° ° °  09/01/2014 REGINE GERMER [id number] [birth year]  Surgeon(s): Conrad Brandon, MD  Procedure(s): BRACHIOCEPHALIC ARTERIOVENOUS (AV) FISTULA CREATION-left INSERTION OF DIALYSIS CATHETER IN RIGHT INTERNAL JUGULAR  x Do not stick fistula for 12 weeks

## 2014-09-01 NOTE — H&P (View-Only) (Signed)
Vein mapping reviewed.   Has adequate vein for left brachial cephalic AVF Plan discussed with pt and her mother   Dr Bridgett Larsson to place left arm AVF tomorrow as well as Diatek catheter Risk benefit possible complications discussed including but not limited to bleeding infection fistula non maturation ischemic steal NPO p midnight Consent  Pt to continue her current antibiotics.  She is on [place], MD [facility] [place] Office: [phone] Pager: [phone]

## 2014-09-01 NOTE — Op Note (Addendum)
OPERATIVE NOTE  PROCEDURE: 1. Right internal jugular vein tunneled dialysis catheter placement 2. Right internal jugular vein cannulation under ultrasound guidance 3. Left brachiocephalic arteriovenous fistula    PRE-OPERATIVE DIAGNOSIS: end-stage renal failure  POST-OPERATIVE DIAGNOSIS: same as above  SURGEON: Adele Barthel, MD  ANESTHESIA: none  ESTIMATED BLOOD LOSS: 30 cc  FINDING(S): 1.  Tips of the catheter in the right atrium on fluoroscopy 2.  No obvious pneumothorax on fluoroscopy  SPECIMEN(S):  none  INDICATIONS:   Tracy [name] is a 29 y.o. female who presents with end stage renal disease.  The patient presents for tunneled dialysis catheter placement.  The patient is aware the risks of tunneled dialysis catheter placement include but are not limited to: bleeding, infection, central venous injury, pneumothorax, possible venous stenosis, possible malpositioning in the venous system, and possible infections related to long-term catheter presence.  The patient was aware of these risks and agreed to proceed.  DESCRIPTION: After written full informed consent was obtained from the patient, the patient was taken back to the operating room.  Prior to induction, the patient was given IV antibiotics.  After obtaining adequate sedation, the patient was prepped and draped in the standard fashion for a chest or neck tunneled dialysis catheter placement.   The cannulation site, the catheter exit site, and tract for the subcutaneous tunnel were then anesthestized with a total of 10c of a 1:1 mixture of 0.5% Marcaine without epinepherine and 1% Lidocaine with epinepherine.  Under ultrasound guidance, the right internal jugular vein was cannulated with the 18 gauge needle.  A J-wire was then placed down into the inferior vena cava under fluoroscopic guidance.  The wire was then secured in place with a clamp to the drapes.  I then made stab incisions at the neck and exit sites.   I  dissected from the exit site to the cannulation site with a metal tunneler.   The subcutaneous tunnel was dilated by passing a plastic dilator over the metal dissector. The wire was then unclamped and I removed the needle.  The skin tract and venotomy was dilated serially with dilators.  Finally, the dilator-sheath was placed under fluoroscopic guidance into the superior vena cava.  The dilator and wire were removed.  A 23 cm Diatek catheter was placed under fluoroscopic guidance down into the right atrium.  The sheath was broken and peeled away while holding the catheter cuff at the level of the skin.  The back end of this catheter was transected, revealing the two lumens of this catheter.  The ports were docked onto these two lumens.  The catheter hub was then screwed into place.  Each port was tested by aspirating and flushing.  No resistance was noted.  Each port was then thoroughly flushed with heparinized saline.  The catheter was secured in placed with two interrupted stitches of 3-0 Nylon tied to the catheter.  The neck incision was closed with a U-stitch of 4-0 Monocryl.  The neck and chest incision were cleaned and sterile bandages applied.  Each port was then loaded with concentrated heparin (1000 Units/mL) at the manufacturer recommended volumes to each port.  Sterile caps were applied to each port.  On completion fluoroscopy, the tips of the catheter were in the right atrium, and there was no evidence of pneumothorax.  The drapes were taken down and the patient was repositioned for left arm access.  The patient was redraped and reprepped for a left arm access procedure.  I  turned my attention first to identifying the patient's cephalic vein and brachial artery.  Using SonoSite guidance, the location of these vessels were marked out on the skin.   I made a transverse incision at the level of the antecubitum and dissected through the subcutaneous tissue and fascia to gain exposure of the brachial  artery.  This was noted to be 3 mm in diameter externally.  This was dissected out proximally and distally and controlled with vessel loops .  I then dissected out the cephalic vein.  This was noted to be 3.0-3.5 mm in diameter externally.  The distal segment of the vein was ligated with a  2-0 silk, and the vein was transected.  The proximal segment was interrogated with serial dilators.  The vein accepted up to a 3 mm dilator without any difficulty.  I then instilled the heparinized saline into the vein and clamped it.  At this point, I reset my exposure of the brachial artery and placed the artery under tension proximally and distally.  I made an arteriotomy with a #11 blade, and then I extended the arteriotomy with a Potts scissor.  I injected heparinized saline proximal and distal to this arteriotomy.  The vein was then sewn to the artery in an end-to-side configuration with a running stitch of 7-0 Prolene.  Prior to completing this anastomosis, I allowed the vein and artery to backbleed.  There was no evidence of clot from any vessels.  I completed the anastomosis in the usual fashion and then released all vessel loops and clamps.  There was a palpable thrill in the venous outflow, and there was a dopplerable radial signal which did not augment much.  At this point, I irrigated out the surgical wound.  There was no further active bleeding.  The subcutaneous tissue was reapproximated with a running stitch of 3-0 Vicryl.  The skin was then reapproximated with a running subcuticular stitch of 4-0 Vicryl.  The skin was then cleaned, dried, and reinforced with Dermabond.  The patient tolerated this procedure well.    COMPLICATIONS: none  CONDITION: stable   Adele Barthel, MD [facility] [place] Office: [phone] Pager: [phone]  09/01/2014, 3:12 PM

## 2014-09-02 ENCOUNTER — Encounter (HOSPITAL_COMMUNITY): Payer: Self-pay | Admitting: Vascular Surgery

## 2014-09-02 LAB — GLUCOSE, CAPILLARY
GLUCOSE-CAPILLARY: 525 mg/dL — AB (ref 65–99)
Glucose-Capillary: 260 mg/dL — ABNORMAL HIGH (ref 65–99)
Glucose-Capillary: 309 mg/dL — ABNORMAL HIGH (ref 65–99)

## 2014-09-02 LAB — CBC
HEMATOCRIT: 26.8 % — AB (ref 36.0–46.0)
Hemoglobin: 8.4 g/dL — ABNORMAL LOW (ref 12.0–15.0)
MCH: 29.1 pg (ref 26.0–34.0)
MCHC: 31.3 g/dL (ref 30.0–36.0)
MCV: 92.7 fL (ref 78.0–100.0)
PLATELETS: 365 10*3/uL (ref 150–400)
RBC: 2.89 MIL/uL — ABNORMAL LOW (ref 3.87–5.11)
RDW: 18.3 % — ABNORMAL HIGH (ref 11.5–15.5)
WBC: 10.6 10*3/uL — AB (ref 4.0–10.5)

## 2014-09-02 LAB — RENAL FUNCTION PANEL
ANION GAP: 14 (ref 5–15)
Albumin: 2 g/dL — ABNORMAL LOW (ref 3.5–5.0)
BUN: 43 mg/dL — ABNORMAL HIGH (ref 6–20)
CO2: 24 mmol/L (ref 22–32)
Calcium: 8.2 mg/dL — ABNORMAL LOW (ref 8.9–10.3)
Chloride: 95 mmol/L — ABNORMAL LOW (ref 101–111)
Creatinine, Ser: 6.18 mg/dL — ABNORMAL HIGH (ref 0.44–1.00)
GFR calc Af Amer: 10 mL/min — ABNORMAL LOW (ref >60)
GFR calc non Af Amer: 8 mL/min — ABNORMAL LOW (ref >60)
Glucose, Bld: 503 mg/dL — ABNORMAL HIGH (ref 65–99)
PHOSPHORUS: 5.6 mg/dL — AB (ref 2.5–4.6)
Potassium: 5 mmol/L (ref 3.5–5.1)
SODIUM: 133 mmol/L — AB (ref 135–145)

## 2014-09-02 MED ORDER — BARRIER CREAM NON-SPECIFIED: 1.0000 "application " | TOPICAL_CREAM | Freq: Two times a day (BID) | TOPICAL | Status: DC | PRN

## 2014-09-02 MED ORDER — HYDROCODONE-ACETAMINOPHEN 5-325 MG PO TABS: 1.0000 | ORAL_TABLET | ORAL | Status: DC | PRN

## 2014-09-02 MED ORDER — RENA-VITE PO TABS: 1.0000 | ORAL_TABLET | Freq: Every day | ORAL | Status: DC

## 2014-09-02 MED ORDER — FLUCONAZOLE 100 MG PO TABS: ORAL_TABLET | ORAL | Status: DC

## 2014-09-02 MED ORDER — SODIUM CHLORIDE 0.9 % IV SOLN: 100.0000 mL | INTRAVENOUS | Status: DC | PRN

## 2014-09-02 MED ORDER — SODIUM CHLORIDE 0.9 % IV SOLN
100.0000 mL | INTRAVENOUS | Status: DC | PRN
Start: 2014-09-02 — End: 2014-09-02

## 2014-09-02 MED ORDER — HEPARIN SODIUM (PORCINE) 1000 UNIT/ML DIALYSIS: 1000.0000 [IU] | INTRAMUSCULAR | Status: DC | PRN

## 2014-09-02 MED ORDER — CEFAZOLIN SODIUM-DEXTROSE 2-3 GM-% IV SOLR: 2.0000 g | INTRAVENOUS | Status: DC

## 2014-09-02 MED ORDER — INSULIN GLARGINE 100 UNIT/ML SOLOSTAR PEN: 28.0000 [IU] | PEN_INJECTOR | Freq: Every day | SUBCUTANEOUS | Status: DC

## 2014-09-02 MED ORDER — CALCITRIOL 0.5 MCG PO CAPS: 0.5000 ug | ORAL_CAPSULE | Freq: Every day | ORAL | Status: DC

## 2014-09-02 MED ORDER — ALTEPLASE 2 MG IJ SOLR: 2.0000 mg | Freq: Once | INTRAMUSCULAR | Status: DC | PRN

## 2014-09-02 MED ORDER — PROMETHAZINE HCL 12.5 MG PO TABS
12.5000 mg | ORAL_TABLET | ORAL | Status: DC | PRN
Start: 2014-09-02 — End: 2014-10-05

## 2014-09-02 MED ORDER — LIDOCAINE HCL (PF) 1 % IJ SOLN
5.0000 mL | INTRAMUSCULAR | Status: DC | PRN
Start: 2014-09-02 — End: 2014-09-02

## 2014-09-02 MED ORDER — PENTAFLUOROPROP-TETRAFLUOROETH EX AERO
1.0000 | INHALATION_SPRAY | CUTANEOUS | Status: DC | PRN
Start: 2014-09-02 — End: 2014-09-02

## 2014-09-02 MED ORDER — OXYCODONE HCL ER 20 MG PO T12A: 20.0000 mg | EXTENDED_RELEASE_TABLET | Freq: Two times a day (BID) | ORAL | Status: DC

## 2014-09-02 MED ORDER — LIDOCAINE-PRILOCAINE 2.5-2.5 % EX CREA
1.0000 | TOPICAL_CREAM | CUTANEOUS | Status: DC | PRN
Start: 2014-09-02 — End: 2014-09-02

## 2014-09-02 MED ORDER — CEFAZOLIN SODIUM-DEXTROSE 2-3 GM-% IV SOLR
2.0000 g | Freq: Once | INTRAVENOUS | Status: AC
  Administered 2014-09-02: 2 g via INTRAVENOUS
  Filled 2014-09-02: qty 50

## 2014-09-02 MED ORDER — NEPRO/CARBSTEADY PO LIQD: 237.0000 mL | ORAL | Status: DC | PRN

## 2014-09-02 MED ORDER — HEPARIN SODIUM (PORCINE) 1000 UNIT/ML DIALYSIS: 100.0000 [IU]/kg | INTRAMUSCULAR | Status: DC | PRN

## 2014-09-02 MED ORDER — INSULIN ASPART 100 UNIT/ML FLEXPEN: 7.0000 [IU] | PEN_INJECTOR | Freq: Three times a day (TID) | SUBCUTANEOUS | Status: DC

## 2014-09-02 MED ORDER — ZOLPIDEM TARTRATE 5 MG PO TABS: 5.0000 mg | ORAL_TABLET | Freq: Every evening | ORAL | Status: DC | PRN

## 2014-09-02 MED ORDER — CEPHALEXIN 500 MG PO CAPS: 500.0000 mg | ORAL_CAPSULE | Freq: Four times a day (QID) | ORAL | Status: DC

## 2014-09-02 NOTE — Progress Notes (Signed)
Subjective: Interval History: has complaints wants to get home.  Objective: Vital signs in last 24 hours: Temp:  [98 F (36.7 C)-99.5 F (37.5 C)] 99.5 F (37.5 C) (07/22 0400) Pulse Rate:  [69-81] 81 (07/22 0400) Resp:  [10-18] 18 (07/22 0400) BP: (109-128)/(41-74) 121/59 mmHg (07/22 0400) SpO2:  [90 %-98 %] 93 % (07/22 0400) Weight:  [82.4 kg (181 lb 10.5 oz)-84.1 kg (185 lb 6.5 oz)] 82.4 kg (181 lb 10.5 oz) (07/21 2158) Weight change: -0.6 kg (-1 lb 5.2 oz)  Intake/Output from previous day: 07/21 0701 - 07/22 0700 In: 0  Out: 2547 [Urine:600] Intake/Output this shift: Total I/O In: 240 [P.O.:240] Out: -   General appearance: alert, cooperative and no distress Resp: rales bibasilar Cardio: S1, S2 normal and systolic murmur: holosystolic 2/6, blowing at apex GI: soft, pos bs, liver down 5 cm Extremities: RIJ cath PC, AVF LUA B&T, edema 2-3+  Lab Results:  Recent Labs  09/01/14 0518 09/01/14 2020  WBC 11.2* 13.6*  HGB 9.1* 8.8*  HCT 28.8* 28.8*  PLT 398 391   BMET:  Recent Labs  09/01/14 2000 09/02/14 0519  NA 136 133*  K 5.1 5.0  CL 100* 95*  CO2 24 24  GLUCOSE 195* 503*  BUN 56* 43*  CREATININE 7.28* 6.18*  CALCIUM 8.7* 8.2*   No results for input(s): PTH in the last 72 hours. Iron Studies: No results for input(s): IRON, TIBC, TRANSFERRIN, FERRITIN in the last 72 hours.  Studies/Results: Dg Chest Port 1 View  09/01/2014   CLINICAL DATA:  Dialysis catheter insertion  EXAM: PORTABLE CHEST - 1 VIEW  COMPARISON:  Portable exam 1708 hours compared to 08/28/2014  FINDINGS: RIGHT jugular dual-lumen central venous catheter with tip projecting over inferior RIGHT atrium.  Enlargement of cardiac silhouette with vascular congestion.  Mediastinal contours normal.  Small RIGHT pleural effusion.  Persistent LEFT lower lobe opacity question atelectasis versus consolidation.  No pneumothorax.  IMPRESSION: No pneumothorax following RIGHT jugular line placement.  Small  RIGHT pleural effusion with persistent atelectasis versus consolidation LEFT lower lobe.   Electronically Signed   By: Lavonia Dana M.D.   On: 09/01/2014 17:37    I have reviewed the patient's current medications.  Assessment/Plan: 1 ESRD HD yest, do again. Now improving vol, solute, CLIP in process 2 Anemia esa/Fe 3 HPTH vit D 4 DM volatile 5 HTN lower meds 6 pyomyositis better P HD, esa, CLIP    LOS: 24 days   Saree Krogh L 09/02/2014,9:08 AM

## 2014-09-02 NOTE — Progress Notes (Signed)
09/02/2014 11:45 AM  Per Baldo Ash from [employer] spoke with patient regarding Kidney treatment options. Stated she would meet patient at their outpatient clinic once discharged.   [employer], RN-BC, [facility] Phone [phone]

## 2014-09-02 NOTE — Care Management Note (Signed)
Case Management Note  Patient Details  Name: Tracy [name] MRN: [id number] Date of Birth: [birth year]  Subjective/Objective:          CM has following for duration of visit for progression and d/c planning          Action/Plan: 09/02/2014 Met with pt and mother re [place] needs. Pt selected [facility] for HH needs. Pt hopes to return to work part time as soon as possible,this CM explained the [facility] would be available only while the pt is home bound. This pt and her mother stated understanding of this.   Expected Discharge Date:       09/02/2014           Expected Discharge Plan:  [place]  In-House Referral:  NA  Discharge planning Services  NA, CM Consult  Post Acute Care Choice:  NA Choice offered to:  NA  DME Arranged:    DME Agency:     HH Arranged:  RN, PT, OT, Nurse's Aide [place] Agency:  [place]  Status of Service:  Completed, signed off  Medicare Important Message Given:    Date Medicare IM Given:    Medicare IM give by:    Date Additional Medicare IM Given:    Additional Medicare Important Message give by:     If discussed at [place] of Stay Meetings, dates discussed:    Additional Comments:  Adron Bene, RN 09/02/2014, 2:55 PM

## 2014-09-02 NOTE — Progress Notes (Signed)
Inpatient Diabetes Program Recommendations  AACE/ADA: New Consensus Statement on Inpatient Glycemic Control (2013)  Target Ranges:  Prepandial:   less than 140 mg/dL      Peak postprandial:   less than 180 mg/dL (1-2 hours)      Critically ill patients:  140 - 180 mg/dL   Review of Glycemic control:  Results for DEKIYA, OBEIRNE (MRN [id number]) as of 09/02/2014 10:09  Ref. Range 09/01/2014 13:57 09/01/2014 16:41 09/01/2014 17:57 09/01/2014 22:39 09/02/2014 07:39  Glucose-Capillary Latest Ref Range: 65-99 mg/dL 89 75 142 (H) 261 (H) 525 (H)   It appears that patient got 4 mg of Decadron yesterday at 1445 and blood sugars increased this morning.  CBG's have been labile.  Patient now having dialysis which will likely help with blood glucose.  Called and talked with RN.  Note that patient received large dose of Novolog this morning.  Will need to monitor closely for hypoglycemia signs and symptoms this afternoon.  Discussed with RN and asked her to discuss with patient on discharge.  Thanks, Adah Perl, RN, BC-ADM Inpatient Diabetes Coordinator Pager [phone] (8a-5p)

## 2014-09-02 NOTE — Discharge Summary (Signed)
Physician Discharge Summary  Tracy [name] [id number] DOB: Aug 30, 1985 DOA: 08/09/2014  PCP: [name]  Admit date: 08/09/2014 Discharge date: 09/02/2014  Recommendations for Outpatient Follow-up:  1. Pt will need to follow up with PCP in 1-2 weeks post discharge 2. Per ortho team, recommended up with therapy and WBAT in the RLE 3. Plan to remove stiches upon follow up visit, pt was allowed to shower, pat dry wound post shower recommended 4. Please note that on 09/01/2014 pt underwent following procedures by vascular surgeon Dr. Bridgett Larsson  - Right internal jugular vein tunneled dialysis catheter placement - Right internal jugular vein cannulation under ultrasound guidance - Left brachiocephalic arteriovenous fistula  5. Pt advised to continue Keflex until 09/09/2014 as recommended by ID specialist Dr. Megan Salon    Discharge Diagnoses:  Principal Problem:   Myositis Active Problems:   Proliferative diabetic retinopathy   Hypothyroidism   Normocytic anemia   Acute renal failure superimposed on stage 4 chronic kidney disease   Seizure   Sepsis   DM I (diabetes mellitus, type I), uncontrolled   Essential hypertension   Acute respiratory failure with hypoxia  Discharge Condition: Stable  Diet recommendation: renal diet    Brief narrative:    29 year old Caucasian female with history of diabetes mellitus on insulin pump but poor control, hypothyroidism poor compliance she stopped taking her Synthroid 2 months ago due to insurance issues and medication affordability, chronic kidney disease stage IV baseline creatinine close to 3, who was admitted to the hospital with 2 week history of right arm and right leg pain, upon further workup with MRI was found that she had extensive myositis in both right-sided extremities. She was initially seen at [facility] in [place] and then transferred to [facility].  She was seen here by hand surgery, general surgery, orthopedic  surgery, renal, infectious disease, phone consultation with rheumatologist Dr. Amil Amen. Initially no clear reason of myositis could be found, however since her clinical course did not show any improvement she was taken to the OR first by Dr. Amedeo Plenty, right arm fluid was drained which was suggestive of infectious myositis, she has been on IV antibiotics throughout her hospital stay. On 08/20/2014 she returned to the OR again by orthopedic surgery for drainage of right leg fluid collection. Her workup certainly suggests infectious myositis however reason for her bacteremia is not clear at all. ID is on board as well.  Note due to her illness patient's has progressed from chronic kidney disease stage IV. She received dialysis on 7/5 and 7/6, but not since then. Renal to decide whether this is ESRD or not. In addition, nursing noted on 7/10 morning that patient was hypoxic off of oxygen and requiring 3 L to stay above 90% with mild nonproductive cough and some low-grade temperatures. Chest x-ray done only noted mild pleural effusions but no infiltrate. attempts have been made to aggressively diurese her and she's only had a moderate response. Her BUN and creatinine have subsequently risen and nephrology made decision to dialyze her again 7/14.   On 7/12, hemoglobin dropped below 7 so blood transfusion provided, patient herself doing better and Wants to go home.  Assessment/Plan:    Mouth ulcer - resolving   Myositis of the right arm, right thigh with MSSA - Status post right arm muscle biopsy along with fluid drainage and JP drain as well as right leg ET guided drainage - Source of bacteremia remains unclear. Echocardiogram was normal.  - per hand surgeon, recommend to  continue aggressive ROM passive stretch and active ROM, active assisted motion - per ID continue 2 more weeks of ABX, this will give pt total of 4 weeks for MSSA soft tissue infection - switch to oral cephalexin orally once ready  for d/c until 09/09/2014 - ID team has singed off 7/15 and the assistance greatly appreciated   Active Problems:  Proliferative diabetic retinopathy - in the setting of poorly controlled DM - close outpatient follow up   Hypothyroidism - continue synthroid    Normocytic anemia from CKD and uncontrolled DM - iron deficiency and also in part from renal failure and chronic disease - Status post 2 units packed red blood cells transfusion on 7/6 and 7/12 - Hg stable in the past 72 hours, no sings of active bleeding  - also received Aranesp 7/10, continue once weekly    ESRD, secondary to diabetic nephropathy  - nephrology following  - now ESRD requiring HD   Acute respiratory failure with hypoxia secondary to volume overload  - Nursing noted that patient required at least 3 L of oxygen recently - Chest x-ray with no signs of infiltrate, but did note mild pleural effusions leading [place] to start diuresing her aggressively.  - pt with no fever  - volume control with HD   DM I (diabetes mellitus, type I), uncontrolled with renal disease - Poor outpatient control. A1c of 8.9. Currently on Lantus plus sliding scale - continue insulin lantus upon discharge    Essential hypertension - continue Metoprolol, Norvasc   Hyponatremia - from renal failure - improving   Pain control - denies pain this AM - provide analgesia as needed   Obesity  - Body mass index is 32.43 kg/(m^2).  DVT prophylaxis - Heparin SQ  Code Status: Full.  Family Communication: plan of care discussed with the patient and mother at bedside  Disposition Plan: Home    IV access:  Peripheral IV  Procedures and diagnostic studies:   7/03 Right arm muscle biopsy plus fluid drainage and JP drain placement of right arm  7/04 Right IJ dialysis catheter by interventional radiology  7/05 Dialysis session  7/06 Right leg ultrasound guided aspiration by interventional radiology  7/09 Leg  expiration with drainage of fluid  7/06 and 7/12 Transfusion 2 units packed red blood cells  7/12 MR Humerus right WO contrast extensive myositis primarily involving the triceps and coracobrachialis muscles 7/12 MR Femur Right Wo Contrast severe muscle edema within the vastus medialis, vastus intermedius and sartorius  7/14/ CXR Moderate bilateral pleural effusions unchanged from 7/10  Medical Consultants:  Case discussed with rheumatology by phone. Interventional radiology. Infectious disease. Nephrology General surgery Hand surgery Orthopedic surgery  Other Consultants:  None   IAnti-Infectives:   IV vancomycin 6/28-7/5 IV Azactam 6/28-7/4 IV clindamycin 7/4-7/5 IV Ancef 7/6-change to keflex upon discharge   Faye Ramsay, MD [facility] Pager [phone]      Discharge Exam: Filed Vitals:   09/02/14 0932  BP: 116/60  Pulse: 81  Temp: 98.4 F (36.9 C)  Resp: 18   Filed Vitals:   09/01/14 2158 09/01/14 2241 09/02/14 0400 09/02/14 0932  BP: 109/62 119/61 121/59 116/60  Pulse: 72 81 81 81  Temp:  98.7 F (37.1 C) 99.5 F (37.5 C) 98.4 F (36.9 C)  TempSrc:  Oral Oral Oral  Resp: 17 18 18 18   Height:      Weight: 82.4 kg (181 lb 10.5 oz)     SpO2: 98% 95% 93% 95%  General: Pt is alert, follows commands appropriately, not in acute distress Cardiovascular: Regular rate and rhythm, no rubs, no gallops Respiratory: Clear to auscultation bilaterally, no wheezing, no crackles, no rhonchi Abdominal: Soft, non tender, non distended, bowel sounds +, no guarding Extremities: Bilateral LE edema +2, no cyanosis, pulses palpable bilaterally DP and PT Neuro: Grossly nonfocal  Discharge Instructions  Discharge Instructions    Diet - low sodium heart healthy    Complete by:  As directed      Increase activity slowly    Complete by:  As directed             Medication List    STOP taking these medications        acyclovir 400 MG tablet   Commonly known as:  ZOVIRAX     furosemide 20 MG tablet  Commonly known as:  LASIX     ibuprofen 800 MG tablet  Commonly known as:  ADVIL,MOTRIN      TAKE these medications        amLODipine 10 MG tablet  Commonly known as:  NORVASC  Take 10 mg by mouth daily.     barrier cream Crea  Commonly known as:  non-specified  Apply 1 application topically 2 (two) times daily as needed.     calcitRIOL 0.5 MCG capsule  Commonly known as:  ROCALTROL  Take 1 capsule (0.5 mcg total) by mouth daily.     cephALEXin 500 MG capsule  Commonly known as:  KEFLEX  Take 1 capsule (500 mg total) by mouth 4 (four) times daily.     fluconazole 100 MG tablet  Commonly known as:  DIFLUCAN  Take 1 now and 1 in 3 days     HYDROcodone-acetaminophen 5-325 MG per tablet  Commonly known as:  NORCO/VICODIN  Take 1 tablet by mouth every 4 (four) hours as needed for moderate pain.     insulin aspart 100 UNIT/ML FlexPen  Commonly known as:  NOVOLOG FLEXPEN  Inject 7 Units into the skin 3 (three) times daily with meals.     Insulin Glargine 100 UNIT/ML Solostar Pen  Commonly known as:  LANTUS SOLOSTAR  Inject 28 Units into the skin at bedtime.     levothyroxine 88 MCG tablet  Commonly known as:  SYNTHROID, LEVOTHROID  Take 1 tablet (88 mcg total) by mouth daily before breakfast.     MULTI-VITAMIN GUMMIES PO  Take by mouth daily.     multivitamin Tabs tablet  Take 1 tablet by mouth at bedtime.     OxyCODONE 20 mg T12a 12 hr tablet  Commonly known as:  OXYCONTIN  Take 1 tablet (20 mg total) by mouth every 12 (twelve) hours.     promethazine 12.5 MG tablet  Commonly known as:  PHENERGAN  Take 1 tablet (12.5 mg total) by mouth every 4 (four) hours as needed for nausea or vomiting.     zolpidem 5 MG tablet  Commonly known as:  AMBIEN  Take 1 tablet (5 mg total) by mouth at bedtime as needed for sleep.            Follow-up Information    Follow up with MURPHY, TIMOTHY D, MD In 1 week.    Specialty:  Orthopedic Surgery   Contact information:   [address] [place] [address] [phone]       Follow up with Adele Barthel, MD In 4 weeks.   Specialties:  Vascular Surgery, Cardiology   Why:  Office will call you to arrange your appt (sent)   Contact information:   [address] [place] [address] [phone]       Follow up with JONES,ERIN, PA-C.   Specialty:  Family Medicine   Contact information:   [place] [address] [phone]       Follow up with Faye Ramsay, MD.   Specialty:  Internal Medicine   Why:  As needed call my cell phone [phone]   Contact information:   [address] [place] [address] [phone]        The results of significant diagnostics from this hospitalization (including imaging, microbiology, ancillary and laboratory) are listed below for reference.     Microbiology: Recent Results (from the past 240 hour(s))  Surgical pcr screen     Status: None   Collection Time: 09/01/14  5:59 AM  Result Value Ref Range Status   MRSA, PCR NEGATIVE NEGATIVE Final   Staphylococcus aureus NEGATIVE NEGATIVE Final    Comment:        The Xpert SA Assay (FDA approved for NASAL specimens in patients over 26 years of age), is one component of a comprehensive surveillance program.  Test performance has been validated by [facility] for patients greater than or equal to 12 year old. It is not intended to diagnose infection nor to guide or monitor treatment.      Labs: Basic Metabolic Panel:  Recent Labs Lab 08/30/14 0546 08/31/14 0422 09/01/14 0518 09/01/14 2000 09/02/14 0519  NA 132* 133* 135 136 133*  K 4.7 4.6 4.6 5.1 5.0  CL 96* 96* 100* 100* 95*  CO2 24 26 25 24 24   GLUCOSE 430* 311* 299* 195* 503*  BUN 50* 53* 56* 56* 43*  CREATININE 6.84* 7.04* 7.22* 7.28* 6.18*  CALCIUM 8.7* 8.7* 8.9 8.7* 8.2*  PHOS 4.4 4.3 5.3* 5.9* 5.6*   Liver Function  Tests:  Recent Labs Lab 08/30/14 0546 08/31/14 0422 09/01/14 0518 09/01/14 2000 09/02/14 0519  ALBUMIN 2.0* 2.0* 2.1* 2.1* 2.0*   CBC:  Recent Labs Lab 08/28/14 0555 08/29/14 0540 08/30/14 0546 09/01/14 0518 09/01/14 2020  WBC 15.2* 14.1* 13.7* 11.2* 13.6*  HGB 7.9* 8.1* 8.4* 9.1* 8.8*  HCT 24.8* 25.8* 27.1* 28.8* 28.8*  MCV 89.5 89.9 91.9 92.9 92.9  PLT 396 382 392 398 391   CBG:  Recent Labs Lab 09/01/14 1357 09/01/14 1641 09/01/14 1757 09/01/14 2239 09/02/14 0739  GLUCAP 89 75 142* 261* 525*   SIGNED: Time coordinating discharge:  30 minutes  MAGICK-Maddalyn Lutze, MD  [name] 09/02/2014, 9:59 AM Pager [phone]  If 7PM-7AM, please contact night-coverage [email or web address] Password [email or web address]

## 2014-09-02 NOTE — Progress Notes (Signed)
Please note that letter has been written, it is under the letter section in the [facility] system. Please print prior to discharge.  Faye Ramsay, MD  [name] Pager [phone]  If 7PM-7AM, please contact night-coverage [email or web address] Password [email or web address]

## 2014-09-02 NOTE — Procedures (Signed)
I was present at this session.  I have reviewed the session itself and made appropriate changes.  HD via R IJ PC.  Vol xs, 2nd HD going slow.    Vernette Moise L 7/22/201612:24 PM

## 2014-09-02 NOTE — Progress Notes (Addendum)
Patient Discharge: Disposition: Patient discharged to home with family. Education: Patient reviewed medications, prescriptions, follow-up appointments, and discharge instructions, understood and acknowledged.  Given hand out on dialysis diet.  Given information about holding her hypertensive's and antibiotics when scheduled for dialysis. Given information about restricting fluids. IV: Discontinued IV before discharge. Transportation: Patient transported in w/c with the family and staff accompanying her out of the unit. Belongings: Patient took all her belongings with her.

## 2014-09-02 NOTE — Progress Notes (Signed)
     Subjective:  Patient reports pain as mild.  Resting comfortably in a chair.  Requesting to have the dressing on her right lower leg removed.   Objective:   VITALS:   Filed Vitals:   09/02/14 1130 09/02/14 1200 09/02/14 1230 09/02/14 1239  BP: 111/62 110/60 110/64 115/64  Pulse: 79 75 78 78  Temp:    98.3 F (36.8 C)  TempSrc:    Oral  Resp:    18  Height:      Weight:    76.6 kg (168 lb 14 oz)  SpO2:    95%    Neurologically intact ABD soft Neurovascular intact Sensation intact distally Intact pulses distally Dorsiflexion/Plantar flexion intact Incision: dressing C/D/I   Lab Results  Component Value Date   WBC 10.6* 09/02/2014   HGB 8.4* 09/02/2014   HCT 26.8* 09/02/2014   MCV 92.7 09/02/2014   PLT 365 09/02/2014   BMET    Component Value Date/Time   NA 133* 09/02/2014 0519   NA 133* 01/18/2013 1106   K 5.0 09/02/2014 0519   CL 95* 09/02/2014 0519   CO2 24 09/02/2014 0519   GLUCOSE 503* 09/02/2014 0519   GLUCOSE 929* 01/18/2013 1106   BUN 43* 09/02/2014 0519   BUN 37* 01/18/2013 1106   CREATININE 6.18* 09/02/2014 0519   CALCIUM 8.2* 09/02/2014 0519   GFRNONAA 8* 09/02/2014 0519   GFRAA 10* 09/02/2014 0519     Assessment/Plan: 1 Day Post-Op   Principal Problem:   Myositis Active Problems:   Proliferative diabetic retinopathy   Hypothyroidism   Normocytic anemia   Acute renal failure superimposed on stage 4 chronic kidney disease   Seizure   Sepsis   DM I (diabetes mellitus, type I), uncontrolled   Essential hypertension   Acute respiratory failure with hypoxia   Up with therapy WBAT in the RLE Asked to possibly remove stitches before discharge home today.  We are going to hold off for a few more days and remove in the office.  It is ok at this time for the patient to remove the dressing and get wet in the shower.  Gentle soap and water and then pat dry.  Can keep covered with a dry dressing.  OK for discharge from ortho standpoint.     Ivadell Gaul [name] 09/02/2014, 3:52 PM Cell [phone]

## 2014-09-02 NOTE — Progress Notes (Signed)
Patient CBG 525 this morning, called Dr. Doyle Askew, given order to give the sliding scale and the meal coverage as ordered previously.  So, given 27 units of Novolog this am.  Patient and Dr aware.

## 2014-09-05 ENCOUNTER — Telehealth: Payer: Self-pay | Admitting: Vascular Surgery

## 2014-09-05 NOTE — Telephone Encounter (Signed)
LM for pt re appt, dpm °

## 2014-09-05 NOTE — Telephone Encounter (Signed)
-----   Message from Mena Goes, RN sent at 09/01/2014  4:42 PM EDT ----- Regarding: Schedule   ----- Message -----    From: Gabriel Earing, PA-C    Sent: 09/01/2014   4:29 PM      To: [facility] Charge Pool  S/p left BC AVF 09/01/14. F/u with Dr. Bridgett Larsson in 4 weeks.  She does not need duplex.  Thanks, Aldona Bar

## 2014-09-05 NOTE — Telephone Encounter (Signed)
-----   Message from Denman George, RN sent at 09/02/2014 10:45 AM EDT ----- Regarding: Tracy [name]; also needs 6 wk. f/u with [facility]   ----- Message -----    From: Dario Ave    Sent: 09/02/2014   7:29 AM      To: [facility] Charge Pool Subject: Kay's log                                        ----- Message -----    From: Conrad St. Leonard, MD    Sent: 09/01/2014   4:48 PM      To: [address]  NAYDEAN KINNIE LS:[id number] 1985/04/13  Procedure: 1. Right internal jugular vein tunneled dialysis catheter placement 2. Right internal jugular vein cannulation under ultrasound guidance 3. Left brachiocephalic arteriovenous fistula    Asst: Leontine Locket, PAC   Follow-up: 6 weeks

## 2014-09-22 ENCOUNTER — Telehealth: Payer: Self-pay | Admitting: *Deleted

## 2014-09-22 ENCOUNTER — Encounter: Payer: Self-pay | Admitting: Internal Medicine

## 2014-09-22 ENCOUNTER — Other Ambulatory Visit: Payer: Self-pay | Admitting: *Deleted

## 2014-09-22 ENCOUNTER — Ambulatory Visit (INDEPENDENT_AMBULATORY_CARE_PROVIDER_SITE_OTHER): Payer: BLUE CROSS/BLUE SHIELD | Admitting: Internal Medicine

## 2014-09-22 VITALS — BP 154/76 | HR 96 | Temp 98.3°F | Ht 63.0 in | Wt 137.8 lb

## 2014-09-22 DIAGNOSIS — A4901 Methicillin susceptible Staphylococcus aureus infection, unspecified site: Secondary | ICD-10-CM

## 2014-09-22 DIAGNOSIS — R630 Anorexia: Secondary | ICD-10-CM

## 2014-09-22 MED ORDER — MEGESTROL ACETATE 40 MG/ML PO SUSP: 240.0000 mg | Freq: Every day | ORAL | Status: DC

## 2014-09-22 MED ORDER — CEPHALEXIN 500 MG PO CAPS: 500.0000 mg | ORAL_CAPSULE | Freq: Two times a day (BID) | ORAL | Status: DC

## 2014-09-22 NOTE — Telephone Encounter (Addendum)
Patient called c/o no appetite and unable to eat for two or three days. Per Dr. Megan Salon ok for Megace liquid once daily x 30 days with one refill. Patient notified and Rx sent to [facility]

## 2014-09-22 NOTE — Telephone Encounter (Signed)
Patient receives dialysis at [facility] (p:[phone], f:[phone]).  She is under the care of Dr. Erling Cruz.  Care Team updated.   Orders faxed per Dr. Megan Salon for Cefazolin 1 gram post hemodialysis Tuesday, Thursday, Saturday for 4 weeks.  Spoke to [place], sent fax attention Clair Gulling. Patient should start this on her dialysis appointment.  She is to continue her oral antibiotics until that time. Patient verbalized understanding, agreement. Landis Gandy, RN

## 2014-09-22 NOTE — Progress Notes (Signed)
[facility] for Infectious Disease  Patient Active Problem List   Diagnosis Date Noted  . MSSA (methicillin susceptible Staphylococcus aureus) infection 09/22/2014    Priority: High  . Myositis 08/09/2014    Priority: High  . Acute respiratory failure with hypoxia 08/21/2014  . Essential hypertension 08/10/2014  . Sepsis 08/09/2014  . DM I (diabetes mellitus, type I), uncontrolled 08/09/2014  . Irregular periods 07/05/2014  . Vaginal discharge 07/05/2014  . Yeast infection 07/05/2014  . Acute respiratory failure   . Seizure 01/20/2014  . Vomiting 01/20/2014  . Lactic acid acidosis 01/20/2014  . DKA (diabetic ketoacidoses) 01/18/2013  . Leukocytosis, unspecified 01/18/2013  . Acute encephalopathy 01/18/2013  . Hyperkalemia 01/18/2013  . Normocytic anemia 01/18/2013  . Acute renal failure superimposed on stage 4 chronic kidney disease 01/18/2013  . 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 Hypothyroidism 10/15/2012  . Proliferative diabetic retinopathy 09/27/2011    Patient's Medications  New Prescriptions   No medications on file  Previous Medications   AMLODIPINE (NORVASC) 10 MG TABLET    Take 10 mg by mouth daily.   BARRIER CREAM (NON-SPECIFIED) CREA    Apply 1 application topically 2 (two) times daily as needed.   CALCITRIOL (ROCALTROL) 0.5 MCG CAPSULE    Take 1 capsule (0.5 mcg total) by mouth daily.   HYDROCODONE-ACETAMINOPHEN (NORCO/VICODIN) 5-325 MG PER TABLET    Take 1 tablet by mouth every 4 (four) hours as needed for moderate pain.   INSULIN ASPART (NOVOLOG FLEXPEN) 100 UNIT/ML FLEXPEN    Inject 7 Units into the skin 3 (three) times daily with meals.   INSULIN GLARGINE (LANTUS SOLOSTAR) 100 UNIT/ML SOLOSTAR PEN    Inject 28 Units into the skin at bedtime.   LEVOTHYROXINE (SYNTHROID, LEVOTHROID) 88 MCG TABLET    Take 1 tablet (88 mcg total) by mouth daily before breakfast.   MULTIVITAMIN (RENA-VIT) TABS TABLET    Take 1 tablet by mouth at bedtime.   OXYCODONE (OXYCONTIN) 20 MG T12A 12 HR TABLET    Take 1 tablet (20 mg total) by mouth every 12 (twelve) hours.   PROMETHAZINE (PHENERGAN) 12.5 MG TABLET    Take 1 tablet (12.5 mg total) by mouth every 4 (four) hours as needed for nausea or vomiting.   ZOLPIDEM (AMBIEN) 5 MG TABLET    Take 1 tablet (5 mg total) by mouth at bedtime as needed for sleep.  Modified Medications   Modified Medication Previous Medication   CEPHALEXIN (KEFLEX) 500 MG CAPSULE cephALEXin (KEFLEX) 500 MG capsule      Take 1 capsule (500 mg total) by mouth 2 (two) times daily.    Take 1 capsule (500 mg total) by mouth 4 (four) times daily.  Discontinued Medications   FLUCONAZOLE (DIFLUCAN) 100 MG TABLET    Take 1 now and 1 in 3 days   MULTIPLE VITAMINS-MINERALS (MULTI-VITAMIN GUMMIES PO)    Take by mouth daily.    Subjective: [name] is in for hospital follow-up visit. She was hospitalized last month with severe soft tissue infection in her right arm and right thigh due to MSSA. She underwent multiple surgeries for debridement and abscess drainage. She has diabetes and advanced to end-stage renal disease while hospitalized and was started on hemodialysis. She completed 4 weeks of antibiotics therapy on 09/09/2014. Unfortunately, over the last 3-4 days she has had increased swelling in her right thigh associated with severe pain.  Her sutures were removed recently. She still has some occasional clear drainage from 2 areas of her wound. She has not had any documented fever but has had recent night sweats. Because of the changes in her thighs she had blood cultures obtained while on hemodialysis 2 days ago. She was told today that the cultures remain negative.  Review of Systems: Pertinent items are noted in HPI.  Past Medical History  Diagnosis Date  . Thyroid enlargement     "not on medication at this time"  . Urinary tract infection     hx of    . Anemia     presently on iron supplement  . Diabetes mellitus     insulin pump   . Hypothyroidism   . Anxiety   . HSV-2 (herpes simplex virus 2) infection   . HSV-1 (herpes simplex virus 1) infection   . Detached retina   . Yeast infection     took diflucan saturday   . Hypertension   . Irregular periods 07/05/2014  . Vaginal discharge 07/05/2014    Social History  Substance Use Topics  . Smoking status: Never Smoker   . Smokeless tobacco: Never Used  . Alcohol Use: No    Family History  Problem Relation Age of Onset  . Cancer Paternal Grandfather     prostate  . Hyperlipidemia Paternal Grandfather   . Stroke Paternal Grandfather     Allergies  Allergen Reactions  . Penicillins Hives and Swelling  . Sulfa Antibiotics Hives    Objective: Filed Vitals:   09/22/14 1541  BP: 154/76  Pulse: 96  Temp: 98.3 F (36.8 C)  TempSrc: Oral  Height: 5\' 3"  (1.6 m)  Weight: 137 lb 12.8 oz (62.506 kg)   Body mass index is 24.42 kg/(m^2).  General: She appears appropriately worried but otherwise in no distress Skin: No rash Lungs: Clear Cor: Regular S1 and S2 with no murmurs Right chest dialysis catheter site appears normal Abdomen: Soft and nontender Right leg: She has diffuse swelling of her thigh down to the level of the knee. She has pain with palpation. There is no drainage expressible from her medial thigh incision. I do not see any effusion of her right knee.     Problem List Items Addressed This Visit      High   MSSA (methicillin susceptible Staphylococcus aureus) infection - Primary    I strongly suspect that she is having early relapse of her MSSA soft tissue infection. She is scheduled for MRI scan tomorrow at noon. I will start her back on oral cephalexin now until she can restart IV cefazolin after hemodialysis on Saturday. If her MRI shows any new abscess I would consider aspiration or incision and drainage with repeat specimen sent for stain and culture  just to be sure that there is no superinfection requiring broader antibiotics therapy. I will call her once I have the MRI results. She is also being followed by Dr. Fredonia Highland who performed her thigh surgery when she was hospitalized.      Relevant Medications   cephALEXin (KEFLEX) 500 MG capsule       Michel Bickers, MD [facility] [place] [phone] pager   [phone] cell 09/22/2014, 4:29 PM

## 2014-09-22 NOTE — Assessment & Plan Note (Signed)
I strongly suspect that she is having early relapse of her MSSA soft tissue infection. She is scheduled for MRI scan tomorrow at noon. I will start her back on oral cephalexin now until she can restart IV cefazolin after hemodialysis on Saturday. If her MRI shows any new abscess I would consider aspiration or incision and drainage with repeat specimen sent for stain and culture just to be sure that there is no superinfection requiring broader antibiotics therapy. I will call her once I have the MRI results. She is also being followed by Dr. Fredonia Highland who performed her thigh surgery when she was hospitalized.

## 2014-09-23 NOTE — Telephone Encounter (Signed)
error 

## 2014-09-26 ENCOUNTER — Telehealth: Payer: Self-pay | Admitting: Internal Medicine

## 2014-09-26 ENCOUNTER — Telehealth: Payer: Self-pay

## 2014-09-26 NOTE — Telephone Encounter (Signed)
Please text or page me tomorrow and the MRI fax report comes in.

## 2014-09-26 NOTE — Telephone Encounter (Signed)
Patient is calling to see if MRI results from this morning have been received. Please call once received.  [phone]   MRI completed at [place] on [facility].  Called office and left message to fax report .

## 2014-09-26 NOTE — Telephone Encounter (Signed)
I received a fax copy of her MRI report late this afternoon. There is extensive myositis and possibly some synovitis with effusion of her right knee. There is a complex fluid collection in her right thigh measuring 7.7 x 3.6 cm and probably represents residual abscess. I left a message on her answering machine and will try to reach her tomorrow morning.

## 2014-09-27 ENCOUNTER — Telehealth: Payer: Self-pay | Admitting: Internal Medicine

## 2014-09-27 ENCOUNTER — Encounter (HOSPITAL_COMMUNITY): Payer: Self-pay | Admitting: *Deleted

## 2014-09-27 ENCOUNTER — Telehealth: Payer: Self-pay | Admitting: *Deleted

## 2014-09-27 ENCOUNTER — Inpatient Hospital Stay (HOSPITAL_COMMUNITY)
Admission: EM | Admit: 2014-09-27 | Discharge: 2014-10-05 | DRG: 570 | Disposition: A | Discharge status: Home / Self Care | Payer: BLUE CROSS/BLUE SHIELD | Attending: Internal Medicine | Admitting: Internal Medicine

## 2014-09-27 DIAGNOSIS — D649 Anemia, unspecified: Secondary | ICD-10-CM | POA: Diagnosis not present

## 2014-09-27 DIAGNOSIS — Z79891 Long term (current) use of opiate analgesic: Secondary | ICD-10-CM | POA: Diagnosis not present

## 2014-09-27 DIAGNOSIS — D638 Anemia in other chronic diseases classified elsewhere: Secondary | ICD-10-CM | POA: Diagnosis present

## 2014-09-27 DIAGNOSIS — N2581 Secondary hyperparathyroidism of renal origin: Secondary | ICD-10-CM | POA: Diagnosis present

## 2014-09-27 DIAGNOSIS — R609 Edema, unspecified: Secondary | ICD-10-CM | POA: Diagnosis not present

## 2014-09-27 DIAGNOSIS — I10 Essential (primary) hypertension: Secondary | ICD-10-CM | POA: Diagnosis not present

## 2014-09-27 DIAGNOSIS — Z79899 Other long term (current) drug therapy: Secondary | ICD-10-CM

## 2014-09-27 DIAGNOSIS — B009 Herpesviral infection, unspecified: Secondary | ICD-10-CM | POA: Diagnosis present

## 2014-09-27 DIAGNOSIS — L02415 Cutaneous abscess of right lower limb: Secondary | ICD-10-CM | POA: Diagnosis present

## 2014-09-27 DIAGNOSIS — N926 Irregular menstruation, unspecified: Secondary | ICD-10-CM | POA: Diagnosis present

## 2014-09-27 DIAGNOSIS — E1065 Type 1 diabetes mellitus with hyperglycemia: Secondary | ICD-10-CM | POA: Diagnosis present

## 2014-09-27 DIAGNOSIS — N186 End stage renal disease: Secondary | ICD-10-CM | POA: Diagnosis present

## 2014-09-27 DIAGNOSIS — L0291 Cutaneous abscess, unspecified: Secondary | ICD-10-CM

## 2014-09-27 DIAGNOSIS — Z794 Long term (current) use of insulin: Secondary | ICD-10-CM | POA: Diagnosis not present

## 2014-09-27 DIAGNOSIS — N898 Other specified noninflammatory disorders of vagina: Secondary | ICD-10-CM | POA: Diagnosis present

## 2014-09-27 DIAGNOSIS — M79604 Pain in right leg: Secondary | ICD-10-CM | POA: Diagnosis present

## 2014-09-27 DIAGNOSIS — K5900 Constipation, unspecified: Secondary | ICD-10-CM | POA: Diagnosis present

## 2014-09-27 DIAGNOSIS — I120 Hypertensive chronic kidney disease with stage 5 chronic kidney disease or end stage renal disease: Secondary | ICD-10-CM | POA: Diagnosis present

## 2014-09-27 DIAGNOSIS — B9689 Other specified bacterial agents as the cause of diseases classified elsewhere: Secondary | ICD-10-CM | POA: Diagnosis not present

## 2014-09-27 DIAGNOSIS — Z992 Dependence on renal dialysis: Secondary | ICD-10-CM

## 2014-09-27 DIAGNOSIS — Z881 Allergy status to other antibiotic agents status: Secondary | ICD-10-CM

## 2014-09-27 DIAGNOSIS — Z9641 Presence of insulin pump (external) (internal): Secondary | ICD-10-CM | POA: Diagnosis present

## 2014-09-27 DIAGNOSIS — E1022 Type 1 diabetes mellitus with diabetic chronic kidney disease: Secondary | ICD-10-CM | POA: Diagnosis present

## 2014-09-27 DIAGNOSIS — F419 Anxiety disorder, unspecified: Secondary | ICD-10-CM | POA: Diagnosis present

## 2014-09-27 DIAGNOSIS — E10649 Type 1 diabetes mellitus with hypoglycemia without coma: Secondary | ICD-10-CM | POA: Diagnosis not present

## 2014-09-27 DIAGNOSIS — M79652 Pain in left thigh: Secondary | ICD-10-CM | POA: Diagnosis not present

## 2014-09-27 DIAGNOSIS — M609 Myositis, unspecified: Secondary | ICD-10-CM | POA: Diagnosis present

## 2014-09-27 DIAGNOSIS — E039 Hypothyroidism, unspecified: Secondary | ICD-10-CM | POA: Diagnosis present

## 2014-09-27 DIAGNOSIS — L02416 Cutaneous abscess of left lower limb: Secondary | ICD-10-CM | POA: Diagnosis not present

## 2014-09-27 DIAGNOSIS — Z880 Allergy status to penicillin: Secondary | ICD-10-CM | POA: Diagnosis not present

## 2014-09-27 DIAGNOSIS — Z9889 Other specified postprocedural states: Secondary | ICD-10-CM | POA: Diagnosis not present

## 2014-09-27 DIAGNOSIS — IMO0002 Reserved for concepts with insufficient information to code with codable children: Secondary | ICD-10-CM | POA: Diagnosis present

## 2014-09-27 HISTORY — DX: Cutaneous abscess of right lower limb: L02.415

## 2014-09-27 LAB — I-STAT CG4 LACTIC ACID, ED: Lactic Acid, Venous: 0.65 mmol/L (ref 0.5–2.0)

## 2014-09-27 LAB — CBC WITH DIFFERENTIAL/PLATELET
Basophils Absolute: 0.1 10*3/uL (ref 0.0–0.1)
Basophils Relative: 1 % (ref 0–1)
EOS ABS: 0.1 10*3/uL (ref 0.0–0.7)
EOS PCT: 1 % (ref 0–5)
HCT: 31.8 % — ABNORMAL LOW (ref 36.0–46.0)
Hemoglobin: 10.3 g/dL — ABNORMAL LOW (ref 12.0–15.0)
LYMPHS ABS: 1 10*3/uL (ref 0.7–4.0)
Lymphocytes Relative: 11 % — ABNORMAL LOW (ref 12–46)
MCH: 28.7 pg (ref 26.0–34.0)
MCHC: 32.4 g/dL (ref 30.0–36.0)
MCV: 88.6 fL (ref 78.0–100.0)
Monocytes Absolute: 0.7 10*3/uL (ref 0.1–1.0)
Monocytes Relative: 8 % (ref 3–12)
Neutro Abs: 6.9 10*3/uL (ref 1.7–7.7)
Neutrophils Relative %: 79 % — ABNORMAL HIGH (ref 43–77)
PLATELETS: 299 10*3/uL (ref 150–400)
RBC: 3.59 MIL/uL — ABNORMAL LOW (ref 3.87–5.11)
RDW: 14.3 % (ref 11.5–15.5)
WBC: 8.7 10*3/uL (ref 4.0–10.5)

## 2014-09-27 LAB — COMPREHENSIVE METABOLIC PANEL
ALK PHOS: 97 U/L (ref 38–126)
ALT: 6 U/L — AB (ref 14–54)
AST: 24 U/L (ref 15–41)
Albumin: 3.4 g/dL — ABNORMAL LOW (ref 3.5–5.0)
Anion gap: 13 (ref 5–15)
BILIRUBIN TOTAL: 0.5 mg/dL (ref 0.3–1.2)
BUN: 13 mg/dL (ref 6–20)
CALCIUM: 9.5 mg/dL (ref 8.9–10.3)
CO2: 27 mmol/L (ref 22–32)
CREATININE: 3.31 mg/dL — AB (ref 0.44–1.00)
Chloride: 97 mmol/L — ABNORMAL LOW (ref 101–111)
GFR calc Af Amer: 20 mL/min — ABNORMAL LOW (ref >60)
GFR calc non Af Amer: 18 mL/min — ABNORMAL LOW (ref >60)
Glucose, Bld: 212 mg/dL — ABNORMAL HIGH (ref 65–99)
Potassium: 3.5 mmol/L (ref 3.5–5.1)
Sodium: 137 mmol/L (ref 135–145)
Total Protein: 8.1 g/dL (ref 6.5–8.1)

## 2014-09-27 LAB — GLUCOSE, CAPILLARY: GLUCOSE-CAPILLARY: 205 mg/dL — AB (ref 65–99)

## 2014-09-27 MED ORDER — HYDROMORPHONE HCL 1 MG/ML IJ SOLN
1.0000 mg | INTRAMUSCULAR | Status: DC | PRN
  Administered 2014-09-27: 1 mg via INTRAVENOUS
  Filled 2014-09-27: qty 1

## 2014-09-27 MED ORDER — ONDANSETRON HCL 4 MG/2ML IJ SOLN
4.0000 mg | Freq: Once | INTRAMUSCULAR | Status: AC
  Administered 2014-09-27: 4 mg via INTRAVENOUS
  Filled 2014-09-27: qty 2

## 2014-09-27 MED ORDER — ONDANSETRON HCL 4 MG/2ML IJ SOLN: 4.0000 mg | Freq: Three times a day (TID) | INTRAMUSCULAR | Status: DC | PRN

## 2014-09-27 MED ORDER — OXYCODONE HCL 5 MG PO TABS: 5.0000 mg | ORAL_TABLET | ORAL | Status: DC | PRN

## 2014-09-27 MED ORDER — HYDROMORPHONE HCL 1 MG/ML IJ SOLN
1.0000 mg | Freq: Once | INTRAMUSCULAR | Status: AC
Start: 2014-09-27 — End: 2014-09-27
  Administered 2014-09-27: 1 mg via INTRAVENOUS
  Filled 2014-09-27: qty 1

## 2014-09-27 MED ORDER — SODIUM CHLORIDE 0.9 % IV BOLUS (SEPSIS): 500.0000 mL | Freq: Once | INTRAVENOUS | Status: DC

## 2014-09-27 MED ORDER — HYDROMORPHONE HCL 1 MG/ML IJ SOLN
1.0000 mg | Freq: Once | INTRAMUSCULAR | Status: AC
  Administered 2014-09-27: 1 mg via INTRAVENOUS
  Filled 2014-09-27: qty 1

## 2014-09-27 NOTE — ED Notes (Signed)
PA at the bedside.

## 2014-09-27 NOTE — Telephone Encounter (Signed)
Patient eager to know the plan, pain severe.  MD please advise and notify the patient.  She is ready to be hospitalized.  [phone]

## 2014-09-27 NOTE — ED Notes (Signed)
Patient states she has been NPO for about 2 hours.  She had one chicken nugget then.  She has not had anything else to drink.

## 2014-09-27 NOTE — Telephone Encounter (Signed)
I reviewed the MRI findings with Dr. Fredonia Highland and called Tracy [name] again. She remains in "excruciating pain. I recommended that she come to the ED at [facility] for readmission.

## 2014-09-27 NOTE — Telephone Encounter (Signed)
MRI f/u, severe leg pain, willing to be hospialized d/t pain.  Sent MD a text page with this information asking MD to call the pt.

## 2014-09-27 NOTE — Telephone Encounter (Signed)
I spoke to Tracy [name] by phone this morning. She is currently at the hemodialysis center completing dialysis. She is receiving cefazolin but states that her right thigh pain is worse and was unbearable overnight. I informed her that her MRI shows extensive myositis and a complex fluid that probably represents phlegmon or abscess. I believe she needs readmission and further surgery but will need to discuss with Dr. Fredonia Highland.

## 2014-09-27 NOTE — ED Notes (Signed)
Admitting at the bedside.  

## 2014-09-27 NOTE — Consult Note (Signed)
ORTHOPAEDIC CONSULTATION  REQUESTING PHYSICIAN: Rise Patience, MD  Chief Complaint: R thigh pain  HPI: Tracy [name] is a 29 y.o. female who complains of R thigh pain that began last Monday.  She reports that the pain has been increasing.  This morning she was having difficulty standing and ambulation.  She feels the pain is more in the posterior aspect of the knee and distal thigh.  She was recently hospitalized with septic myositis that required surgical I/D of the R arm and R thigh.  She was discharged home on abx and was doing well till this past Monday.  She feels that the pain she is experiencing at this time is similar to before her previous wash out.  She denies recent fevers/chills.  A repeat CBC done last week in the office was not evaluated.  The patient also reports that preliminary results from blood cultures taken at dialysis a few days ago were negative.    Past Medical History  Diagnosis Date  . Thyroid enlargement     "not on medication at this time"  . Urinary tract infection     hx of  . Anemia     presently on iron supplement  . Diabetes mellitus     insulin pump   . Hypothyroidism   . Anxiety   . HSV-2 (herpes simplex virus 2) infection   . HSV-1 (herpes simplex virus 1) infection   . Detached retina   . Yeast infection     took diflucan saturday   . Hypertension   . Irregular periods 07/05/2014  . Vaginal discharge 07/05/2014   Past Surgical History  Procedure Laterality Date  . Cholecystectomy    . Pilonidal cyst excision    . Wisdom tooth extraction    . Pars plana vitrectomy  10/01/2011    Procedure: PARS PLANA VITRECTOMY WITH 25 GAUGE;  Surgeon: Hayden Pedro, MD;  Location: [place];  Service: Ophthalmology;  Laterality: Right;  Repair Complex Traction Retinal Detachment  . Trigger finger release Right 09/21/13  . I&d extremity Right 08/14/2014    Procedure: IRRIGATION AND DEBRIDEMENT EXTREMITY WITH MUSCLE BIOPSY;  Surgeon: Roseanne Kaufman,  MD;  Location: [place];  Service: Orthopedics;  Laterality: Right;  . I&d extremity Right 08/20/2014    Procedure:  I AND D LEG / THIGH ABSCESS AND MANIPULATION OF RIGHT ELBOW.;  Surgeon: Renette Butters, MD;  Location: [place];  Service: Orthopedics;  Laterality: Right;  . Av fistula placement Left 09/01/2014    Procedure: BRACHIOCEPHALIC ARTERIOVENOUS (AV) FISTULA CREATION;  Surgeon: Conrad Remington, MD;  Location: [place];  Service: Vascular;  Laterality: Left;  . Insertion of dialysis catheter N/A 09/01/2014    Procedure: INSERTION OF DIALYSIS CATHETER IN RIGHT INTERNAL JUGUILAR;  Surgeon: Conrad Kirkland, MD;  Location: [place];  Service: Vascular;  Laterality: N/A;   Social History   Social History  . Marital Status: Legally Separated    Spouse Name: Audelia Acton  . Number of Children: 0  . Years of Education: College   Occupational History  .  [employer]   Social History Main Topics  . Smoking status: Never Smoker   . Smokeless tobacco: Never Used  . Alcohol Use: No  . Drug Use: No  . Sexual Activity: Not Currently    Birth Control/ Protection: None   Other Topics Concern  . None   Social History Narrative   Patient lives at home with family.   Caffeine Use:  1 soda daily   Family History  Problem Relation Age of Onset  . Cancer Paternal Grandfather     prostate  . Hyperlipidemia Paternal Grandfather   . Stroke Paternal Grandfather    Allergies  Allergen Reactions  . Penicillins Hives and Swelling  . Sulfa Antibiotics Hives   Prior to Admission medications   Medication Sig Start Date End Date Taking? Authorizing Provider  acyclovir (ZOVIRAX) 400 MG tablet Take 400 mg by mouth 2 (two) times daily as needed.   Yes Historical Provider, MD  amLODipine (NORVASC) 10 MG tablet Take 10 mg by mouth daily.   Yes Historical Provider, MD  calcitRIOL (ROCALTROL) 0.5 MCG capsule Take 1 capsule (0.5 mcg total) by mouth daily. 09/02/14  Yes Theodis Blaze, MD  HYDROcodone-acetaminophen (NORCO/VICODIN)  5-325 MG per tablet Take 1 tablet by mouth every 4 (four) hours as needed for moderate pain. 09/02/14  Yes Theodis Blaze, MD  insulin aspart (NOVOLOG FLEXPEN) 100 UNIT/ML FlexPen Inject 7 Units into the skin 3 (three) times daily with meals. Patient taking differently: Inject 5 Units into the skin 3 (three) times daily with meals.  09/02/14  Yes Theodis Blaze, MD  Insulin Glargine (LANTUS SOLOSTAR) 100 UNIT/ML Solostar Pen Inject 28 Units into the skin at bedtime. Patient taking differently: Inject 18 Units into the skin at bedtime.  09/02/14  Yes Theodis Blaze, MD  levothyroxine (SYNTHROID, LEVOTHROID) 75 MCG tablet Take 75 mcg by mouth daily. 09/02/14  Yes Historical Provider, MD  multivitamin (RENA-VIT) TABS tablet Take 1 tablet by mouth at bedtime. 09/02/14  Yes Theodis Blaze, MD  OxyCODONE (OXYCONTIN) 20 mg T12A 12 hr tablet Take 1 tablet (20 mg total) by mouth every 12 (twelve) hours. 09/02/14  Yes Theodis Blaze, MD  sevelamer carbonate (RENVELA) 800 MG tablet Take 800 mg by mouth 3 (three) times daily with meals.   Yes Historical Provider, MD  barrier cream (NON-SPECIFIED) CREA Apply 1 application topically 2 (two) times daily as needed. Patient not taking: Reported on 09/27/2014 09/02/14   Theodis Blaze, MD  cephALEXin (KEFLEX) 500 MG capsule Take 1 capsule (500 mg total) by mouth 2 (two) times daily. Patient not taking: Reported on 09/27/2014 09/22/14   Michel Bickers, MD  levothyroxine (SYNTHROID, LEVOTHROID) 88 MCG tablet Take 1 tablet (88 mcg total) by mouth daily before breakfast. Patient not taking: Reported on 09/27/2014 01/25/14   Verlee Monte, MD  megestrol (MEGACE) 40 MG/ML suspension Take 6 mLs (240 mg total) by mouth daily. Patient not taking: Reported on 09/27/2014 09/22/14   Michel Bickers, MD  promethazine (PHENERGAN) 12.5 MG tablet Take 1 tablet (12.5 mg total) by mouth every 4 (four) hours as needed for nausea or vomiting. Patient not taking: Reported on 09/27/2014 09/02/14   Theodis Blaze,  MD  zolpidem (AMBIEN) 5 MG tablet Take 1 tablet (5 mg total) by mouth at bedtime as needed for sleep. Patient not taking: Reported on 09/22/2014 09/02/14   Theodis Blaze, MD   No results found.  Positive ROS: All other systems have been reviewed and were otherwise negative with the exception of those mentioned in the HPI and as above.  Labs cbc  Recent Labs  09/27/14 2025  WBC 8.7  HGB 10.3*  HCT 31.8*  PLT 299    Labs inflam No results for input(s): CRP in the last 72 hours.  Invalid input(s): ESR  Labs coag No results for input(s): INR, PTT in the last 72 hours.  Invalid input(s): PT   Recent Labs  09/27/14 2025  NA 137  K 3.5  CL 97*  CO2 27  GLUCOSE 212*  BUN 13  CREATININE 3.31*  CALCIUM 9.5    Physical Exam: Filed Vitals:   09/27/14 2245  BP: 121/65  Pulse: 86  Temp:   Resp:    General: Alert, no acute distress Cardiovascular: No pedal edema Respiratory: No cyanosis, no use of accessory musculature GI: No organomegaly, abdomen is soft and non-tender Skin: No lesions in the area of chief complaint other than those listed below in MSK exam.  Neurologic: Sensation intact distally Psychiatric: Patient is competent for consent with normal mood and affect Lymphatic: No axillary or cervical lymphadenopathy  MUSCULOSKELETAL:  R thigh has a healing incision to the inner thigh.  The thigh is also swollen extending down to the right knee.  She is very tender to palpation of the superior portion of the knee as well as the inner distal thigh. She has not tenderness over the incision site or proximal thigh.  No warmth or erythema was noted.  ROM of the knee is limited due to pain and swelling.  Sensation is intact with 2+ distal pulses.  Other extremities are atraumatic with painless ROM and NVI.  Assessment: R thigh and knee pain  Plan: The plan will be to [place] the R knee and thigh.  She will be NPO after midnight.  She does not appear septic with a normal CBC  and blood cultures.  We will see what the US reveals.  If a pocket of fluid is located, we will plan to take to the OR for I/D.  ID is on board and following.  We appreciate their input on abx therapy.   Weight Bearing Status: Onnie Graham Cell [phone]   09/27/2014 11:11 PM

## 2014-09-27 NOTE — ED Provider Notes (Signed)
CSN: [id number]     Arrival date & time 09/27/14  1911 History   First MD Initiated Contact with Patient 09/27/14 2052     Chief Complaint  Patient presents with  . Leg Problem     (Consider location/radiation/quality/duration/timing/severity/associated sxs/prior Treatment) HPI Comments: Patient is a 29 year old female with a past medical history of type 1 diabetes and recent history of myositis and abscess of right thigh who presents with worsening right thigh pain for the past 3 days. Symptoms started gradually and progressively worsened since the onset. The pain is throbbing and severe and radiating down her right leg. Palpation makes the pain worse. Patient reports 20mg  oxycodone at home is not helping with the pain. Patient had an MRI done yesterday which shows worsening infection evening after receiving IV cefazolin. Patient was contacted and told to come to the ED and Dr. Percell Miller will take her to the OR. Patient also sees Dr. Megan Salon of Infectious Disease. Patient's first surgery done 7.21.2016   Past Medical History  Diagnosis Date  . Thyroid enlargement     "not on medication at this time"  . Urinary tract infection     hx of  . Anemia     presently on iron supplement  . Diabetes mellitus     insulin pump   . Hypothyroidism   . Anxiety   . HSV-2 (herpes simplex virus 2) infection   . HSV-1 (herpes simplex virus 1) infection   . Detached retina   . Yeast infection     took diflucan saturday   . Hypertension   . Irregular periods 07/05/2014  . Vaginal discharge 07/05/2014   Past Surgical History  Procedure Laterality Date  . Cholecystectomy    . Pilonidal cyst excision    . Wisdom tooth extraction    . Pars plana vitrectomy  10/01/2011    Procedure: PARS PLANA VITRECTOMY WITH 25 GAUGE;  Surgeon: Hayden Pedro, MD;  Location: [place];  Service: Ophthalmology;  Laterality: Right;  Repair Complex Traction Retinal Detachment  . Trigger finger release Right 09/21/13  . I&d  extremity Right 08/14/2014    Procedure: IRRIGATION AND DEBRIDEMENT EXTREMITY WITH MUSCLE BIOPSY;  Surgeon: Roseanne Kaufman, MD;  Location: [place];  Service: Orthopedics;  Laterality: Right;  . I&d extremity Right 08/20/2014    Procedure:  I AND D LEG / THIGH ABSCESS AND MANIPULATION OF RIGHT ELBOW.;  Surgeon: Renette Butters, MD;  Location: [place];  Service: Orthopedics;  Laterality: Right;  . Av fistula placement Left 09/01/2014    Procedure: BRACHIOCEPHALIC ARTERIOVENOUS (AV) FISTULA CREATION;  Surgeon: Conrad Paradise, MD;  Location: [place];  Service: Vascular;  Laterality: Left;  . Insertion of dialysis catheter N/A 09/01/2014    Procedure: INSERTION OF DIALYSIS CATHETER IN RIGHT INTERNAL JUGUILAR;  Surgeon: Conrad Ethelsville, MD;  Location: [facility] OR;  Service: Vascular;  Laterality: N/A;   Family History  Problem Relation Age of Onset  . Cancer Paternal Grandfather     prostate  . Hyperlipidemia Paternal Grandfather   . Stroke Paternal Grandfather    Social History  Substance Use Topics  . Smoking status: Never Smoker   . Smokeless tobacco: Never Used  . Alcohol Use: No   OB History    Gravida Para Term Preterm AB TAB SAB Ectopic Multiple Living   2    2  2         Review of Systems  Musculoskeletal: Positive for myalgias.  Skin: Positive for  wound.  All other systems reviewed and are negative.     Allergies  Penicillins and Sulfa antibiotics  Home Medications   Prior to Admission medications   Medication Sig Start Date End Date Taking? Authorizing Provider  amLODipine (NORVASC) 10 MG tablet Take 10 mg by mouth daily.    Historical Provider, MD  barrier cream (NON-SPECIFIED) CREA Apply 1 application topically 2 (two) times daily as needed. 09/02/14   Theodis Blaze, MD  calcitRIOL (ROCALTROL) 0.5 MCG capsule Take 1 capsule (0.5 mcg total) by mouth daily. 09/02/14   Theodis Blaze, MD  cephALEXin (KEFLEX) 500 MG capsule Take 1 capsule (500 mg total) by mouth 2 (two) times daily. 09/22/14    Michel Bickers, MD  HYDROcodone-acetaminophen (NORCO/VICODIN) 5-325 MG per tablet Take 1 tablet by mouth every 4 (four) hours as needed for moderate pain. 09/02/14   Theodis Blaze, MD  insulin aspart (NOVOLOG FLEXPEN) 100 UNIT/ML FlexPen Inject 7 Units into the skin 3 (three) times daily with meals. Patient taking differently: Inject 5 Units into the skin 3 (three) times daily with meals.  09/02/14   Theodis Blaze, MD  Insulin Glargine (LANTUS SOLOSTAR) 100 UNIT/ML Solostar Pen Inject 28 Units into the skin at bedtime. Patient taking differently: Inject 18 Units into the skin at bedtime.  09/02/14   Theodis Blaze, MD  levothyroxine (SYNTHROID, LEVOTHROID) 88 MCG tablet Take 1 tablet (88 mcg total) by mouth daily before breakfast. 01/25/14   Verlee Monte, MD  megestrol (MEGACE) 40 MG/ML suspension Take 6 mLs (240 mg total) by mouth daily. 09/22/14   Michel Bickers, MD  multivitamin (RENA-VIT) TABS tablet Take 1 tablet by mouth at bedtime. 09/02/14   Theodis Blaze, MD  OxyCODONE (OXYCONTIN) 20 mg T12A 12 hr tablet Take 1 tablet (20 mg total) by mouth every 12 (twelve) hours. 09/02/14   Theodis Blaze, MD  promethazine (PHENERGAN) 12.5 MG tablet Take 1 tablet (12.5 mg total) by mouth every 4 (four) hours as needed for nausea or vomiting. 09/02/14   Theodis Blaze, MD  zolpidem (AMBIEN) 5 MG tablet Take 1 tablet (5 mg total) by mouth at bedtime as needed for sleep. Patient not taking: Reported on 09/22/2014 09/02/14   Theodis Blaze, MD   BP 143/71 mmHg  Pulse 89  Temp(Src) 99.3 F (37.4 C) (Oral)  Resp 16  Ht 5\' 3"  (1.6 m)  Wt 137 lb (62.143 kg)  BMI 24.27 kg/m2  SpO2 100%  LMP 09/12/2014 Physical Exam  Constitutional: She is oriented to person, place, and time. She appears well-developed and well-nourished. No distress.  HENT:  Head: Normocephalic and atraumatic.  Eyes: Conjunctivae and EOM are normal.  Neck: Normal range of motion.  Cardiovascular: Normal rate and regular rhythm.  Exam reveals no  gallop and no friction rub.   No murmur heard. Pulmonary/Chest: Effort normal and breath sounds normal. She has no wheezes. She has no rales. She exhibits no tenderness.  Abdominal: Soft. She exhibits no distension. There is no tenderness. There is no rebound.  Musculoskeletal: Normal range of motion.  Right thigh generalized swelling, mild erythema and slight warmth to touch. The swelling extends distal to the knee.There is a well healed surgical scar of the right distal medial thigh without drainage.   Neurological: She is alert and oriented to person, place, and time. Coordination normal.  Speech is goal-oriented. Moves limbs without ataxia.   Skin: Skin is warm and dry.  Psychiatric: She has a normal  mood and affect. Her behavior is normal.  Nursing note and vitals reviewed.   ED Course  Procedures (including critical care time) Labs Review Labs Reviewed  COMPREHENSIVE METABOLIC PANEL - Abnormal; Notable for the following:    Chloride 97 (*)    Glucose, Bld 212 (*)    Creatinine, Ser 3.31 (*)    Albumin 3.4 (*)    ALT 6 (*)    GFR calc non Af Amer 18 (*)    GFR calc Af Amer 20 (*)    All other components within normal limits  CBC WITH DIFFERENTIAL/PLATELET - Abnormal; Notable for the following:    RBC 3.59 (*)    Hemoglobin 10.3 (*)    HCT 31.8 (*)    Neutrophils Relative % 79 (*)    Lymphocytes Relative 11 (*)    All other components within normal limits  GLUCOSE, CAPILLARY - Abnormal; Notable for the following:    Glucose-Capillary 205 (*)    All other components within normal limits  COMPREHENSIVE METABOLIC PANEL  CBC WITH DIFFERENTIAL/PLATELET  PREGNANCY, URINE  I-STAT CG4 LACTIC ACID, ED    Imaging Review No results found. I have personally reviewed and evaluated these images and lab results as part of my medical decision-making.   EKG Interpretation None      MDM   Final diagnoses:  Myositis    10:21 PM Patient will be admitted to Hospitalist and  Dr. Percell Miller will see the patient for surgery. Vitals stable and patient afebrile.    Alvina Chou, PA-C 09/28/14 [place], MD 09/28/14 [phone]

## 2014-09-27 NOTE — ED Notes (Signed)
Phlebotomy at the bedside  

## 2014-09-27 NOTE — ED Notes (Signed)
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 Has had surgery in leg. Infection has caused pt to go into renal failure. MRI done on rt leg yesterday. Dr Megan Salon and Dr Percell Miller instructed pt to come to ED for IV abx and pain medicine.

## 2014-09-27 NOTE — ED Notes (Signed)
Attempted Report x1.   

## 2014-09-28 ENCOUNTER — Inpatient Hospital Stay (HOSPITAL_COMMUNITY): Payer: BLUE CROSS/BLUE SHIELD

## 2014-09-28 ENCOUNTER — Encounter (HOSPITAL_COMMUNITY): Payer: Self-pay | Admitting: Internal Medicine

## 2014-09-28 ENCOUNTER — Encounter (HOSPITAL_COMMUNITY)
Admission: EM | Disposition: A | Discharge status: Home / Self Care | Payer: Self-pay | Source: Home / Self Care | Attending: Internal Medicine

## 2014-09-28 ENCOUNTER — Ambulatory Visit (HOSPITAL_COMMUNITY): Payer: BLUE CROSS/BLUE SHIELD

## 2014-09-28 DIAGNOSIS — L02415 Cutaneous abscess of right lower limb: Secondary | ICD-10-CM | POA: Diagnosis present

## 2014-09-28 DIAGNOSIS — M79604 Pain in right leg: Secondary | ICD-10-CM | POA: Diagnosis present

## 2014-09-28 DIAGNOSIS — R609 Edema, unspecified: Secondary | ICD-10-CM

## 2014-09-28 DIAGNOSIS — D649 Anemia, unspecified: Secondary | ICD-10-CM | POA: Diagnosis present

## 2014-09-28 LAB — CBC WITH DIFFERENTIAL/PLATELET
BASOS ABS: 0.1 10*3/uL (ref 0.0–0.1)
Basophils Relative: 1 % (ref 0–1)
EOS PCT: 3 % (ref 0–5)
Eosinophils Absolute: 0.2 10*3/uL (ref 0.0–0.7)
HCT: 28.7 % — ABNORMAL LOW (ref 36.0–46.0)
Hemoglobin: 9.1 g/dL — ABNORMAL LOW (ref 12.0–15.0)
LYMPHS PCT: 23 % (ref 12–46)
Lymphs Abs: 1.3 10*3/uL (ref 0.7–4.0)
MCH: 28.3 pg (ref 26.0–34.0)
MCHC: 31.7 g/dL (ref 30.0–36.0)
MCV: 89.1 fL (ref 78.0–100.0)
MONO ABS: 0.7 10*3/uL (ref 0.1–1.0)
Monocytes Relative: 12 % (ref 3–12)
Neutro Abs: 3.5 10*3/uL (ref 1.7–7.7)
Neutrophils Relative %: 61 % (ref 43–77)
PLATELETS: 222 10*3/uL (ref 150–400)
RBC: 3.22 MIL/uL — ABNORMAL LOW (ref 3.87–5.11)
RDW: 14.4 % (ref 11.5–15.5)
WBC: 5.7 10*3/uL (ref 4.0–10.5)

## 2014-09-28 LAB — COMPREHENSIVE METABOLIC PANEL WITH GFR
ALT: <5 U/L — ABNORMAL LOW (ref 14–54)
AST: 18 U/L (ref 15–41)
Albumin: 2.7 g/dL — ABNORMAL LOW (ref 3.5–5.0)
Alkaline Phosphatase: 78 U/L (ref 38–126)
Anion gap: 9 (ref 5–15)
BUN: 17 mg/dL (ref 6–20)
CO2: 28 mmol/L (ref 22–32)
Calcium: 9.1 mg/dL (ref 8.9–10.3)
Chloride: 102 mmol/L (ref 101–111)
Creatinine, Ser: 3.94 mg/dL — ABNORMAL HIGH (ref 0.44–1.00)
GFR calc Af Amer: 17 mL/min — ABNORMAL LOW
GFR calc non Af Amer: 14 mL/min — ABNORMAL LOW
Glucose, Bld: 103 mg/dL — ABNORMAL HIGH (ref 65–99)
Potassium: 3.3 mmol/L — ABNORMAL LOW (ref 3.5–5.1)
Sodium: 139 mmol/L (ref 135–145)
Total Bilirubin: 0.2 mg/dL — ABNORMAL LOW (ref 0.3–1.2)
Total Protein: 6.6 g/dL (ref 6.5–8.1)

## 2014-09-28 LAB — GLUCOSE, CAPILLARY
GLUCOSE-CAPILLARY: 73 mg/dL (ref 65–99)
Glucose-Capillary: 106 mg/dL — ABNORMAL HIGH (ref 65–99)
Glucose-Capillary: 74 mg/dL (ref 65–99)
Glucose-Capillary: 98 mg/dL (ref 65–99)
Glucose-Capillary: 95 mg/dL (ref 65–99)

## 2014-09-28 LAB — PREGNANCY, URINE: Preg Test, Ur: NEGATIVE

## 2014-09-28 LAB — SURGICAL PCR SCREEN
MRSA, PCR: NEGATIVE
Staphylococcus aureus: NEGATIVE

## 2014-09-28 SURGERY — IRRIGATION AND DEBRIDEMENT EXTREMITY
Anesthesia: General | Laterality: Right

## 2014-09-28 MED ORDER — HYDROMORPHONE HCL 1 MG/ML IJ SOLN
1.0000 mg | INTRAMUSCULAR | Status: DC | PRN
  Administered 2014-09-28 – 2014-09-30 (×16): 2 mg via INTRAVENOUS
  Administered 2014-09-30 (×2): 1 mg via INTRAVENOUS
  Administered 2014-09-30 (×2): 2 mg via INTRAVENOUS
  Administered 2014-09-30 (×2): 1 mg via INTRAVENOUS
  Administered 2014-10-01 – 2014-10-02 (×11): 2 mg via INTRAVENOUS
  Administered 2014-10-02: 1 mg via INTRAVENOUS
  Administered 2014-10-02: 2 mg via INTRAVENOUS
  Administered 2014-10-03 (×2): 1 mg via INTRAVENOUS
  Filled 2014-09-28 (×2): qty 2
  Filled 2014-09-28: qty 1
  Filled 2014-09-28 (×7): qty 2
  Filled 2014-09-28: qty 1
  Filled 2014-09-28 (×3): qty 2
  Filled 2014-09-28: qty 1
  Filled 2014-09-28 (×2): qty 2
  Filled 2014-09-28 (×2): qty 1
  Filled 2014-09-28 (×6): qty 2
  Filled 2014-09-28: qty 1
  Filled 2014-09-28 (×7): qty 2

## 2014-09-28 MED ORDER — HYDROMORPHONE HCL 1 MG/ML IJ SOLN
1.0000 mg | INTRAMUSCULAR | Status: DC | PRN
  Administered 2014-09-28 (×3): 1 mg via INTRAVENOUS
  Filled 2014-09-28 (×4): qty 1

## 2014-09-28 MED ORDER — INSULIN ASPART 100 UNIT/ML ~~LOC~~ SOLN
0.0000 [IU] | SUBCUTANEOUS | Status: DC
  Administered 2014-09-28: 3 [IU] via SUBCUTANEOUS
  Administered 2014-09-29: 2 [IU] via SUBCUTANEOUS
  Administered 2014-09-30 – 2014-10-01 (×2): 1 [IU] via SUBCUTANEOUS
  Administered 2014-10-01 (×2): 3 [IU] via SUBCUTANEOUS
  Administered 2014-10-02: 2 [IU] via SUBCUTANEOUS
  Administered 2014-10-02: 5 [IU] via SUBCUTANEOUS
  Administered 2014-10-02 (×2): 2 [IU] via SUBCUTANEOUS
  Administered 2014-10-02: 9 [IU] via SUBCUTANEOUS
  Administered 2014-10-03 (×2): 7 [IU] via SUBCUTANEOUS
  Administered 2014-10-03 – 2014-10-04 (×2): 3 [IU] via SUBCUTANEOUS

## 2014-09-28 MED ORDER — SEVELAMER CARBONATE 800 MG PO TABS
800.0000 mg | ORAL_TABLET | Freq: Three times a day (TID) | ORAL | Status: DC
  Administered 2014-09-28 – 2014-10-01 (×4): 800 mg via ORAL
  Filled 2014-09-28 (×15): qty 1

## 2014-09-28 MED ORDER — LIDOCAINE HCL (PF) 1 % IJ SOLN
INTRAMUSCULAR | Status: AC
  Filled 2014-09-28: qty 10

## 2014-09-28 MED ORDER — ONDANSETRON HCL 4 MG/2ML IJ SOLN
INTRAMUSCULAR | Status: AC
  Filled 2014-09-28: qty 2

## 2014-09-28 MED ORDER — MIDAZOLAM HCL 2 MG/2ML IJ SOLN
INTRAMUSCULAR | Status: AC
  Filled 2014-09-28: qty 4

## 2014-09-28 MED ORDER — ACETAMINOPHEN 325 MG PO TABS: 650.0000 mg | ORAL_TABLET | Freq: Four times a day (QID) | ORAL | Status: DC | PRN

## 2014-09-28 MED ORDER — CEFAZOLIN SODIUM-DEXTROSE 2-3 GM-% IV SOLR
2.0000 g | Freq: Two times a day (BID) | INTRAVENOUS | Status: DC
  Administered 2014-09-28 – 2014-09-29 (×2): 2 g via INTRAVENOUS
  Filled 2014-09-28 (×4): qty 50

## 2014-09-28 MED ORDER — CALCITRIOL 0.5 MCG PO CAPS: 0.5000 ug | ORAL_CAPSULE | Freq: Every day | ORAL | Status: DC

## 2014-09-28 MED ORDER — ONDANSETRON HCL 4 MG/2ML IJ SOLN
4.0000 mg | Freq: Four times a day (QID) | INTRAMUSCULAR | Status: DC | PRN
  Administered 2014-09-30: 4 mg via INTRAVENOUS

## 2014-09-28 MED ORDER — INSULIN GLARGINE 100 UNIT/ML ~~LOC~~ SOLN
12.0000 [IU] | Freq: Every day | SUBCUTANEOUS | Status: DC
  Administered 2014-09-28 (×2): 12 [IU] via SUBCUTANEOUS
  Filled 2014-09-28 (×3): qty 0.12

## 2014-09-28 MED ORDER — PROPOFOL 10 MG/ML IV BOLUS
INTRAVENOUS | Status: AC
  Filled 2014-09-28: qty 20

## 2014-09-28 MED ORDER — RENA-VITE PO TABS
1.0000 | ORAL_TABLET | Freq: Every day | ORAL | Status: DC
  Administered 2014-09-28 – 2014-10-04 (×8): 1 via ORAL
  Filled 2014-09-28 (×8): qty 1

## 2014-09-28 MED ORDER — SODIUM CHLORIDE 0.9 % IJ SOLN
3.0000 mL | Freq: Two times a day (BID) | INTRAMUSCULAR | Status: DC
  Administered 2014-09-28 – 2014-10-01 (×4): 3 mL via INTRAVENOUS

## 2014-09-28 MED ORDER — OXYCODONE HCL ER 10 MG PO T12A
20.0000 mg | EXTENDED_RELEASE_TABLET | Freq: Two times a day (BID) | ORAL | Status: DC
  Administered 2014-09-28 – 2014-10-05 (×14): 20 mg via ORAL
  Filled 2014-09-28 (×15): qty 2

## 2014-09-28 MED ORDER — DARBEPOETIN ALFA 100 MCG/0.5ML IJ SOSY
100.0000 ug | PREFILLED_SYRINGE | INTRAMUSCULAR | Status: DC
  Administered 2014-09-29: 100 ug via INTRAVENOUS
  Filled 2014-09-28: qty 0.5

## 2014-09-28 MED ORDER — LEVOTHYROXINE SODIUM 75 MCG PO TABS
75.0000 ug | ORAL_TABLET | Freq: Every day | ORAL | Status: DC
  Administered 2014-09-28 – 2014-10-05 (×8): 75 ug via ORAL
  Filled 2014-09-28 (×8): qty 1

## 2014-09-28 MED ORDER — SODIUM CHLORIDE 0.9 % IV SOLN
125.0000 mg | INTRAVENOUS | Status: DC
  Administered 2014-09-29: 125 mg via INTRAVENOUS
  Filled 2014-09-28 (×3): qty 10

## 2014-09-28 MED ORDER — ONDANSETRON HCL 4 MG PO TABS: 4.0000 mg | ORAL_TABLET | Freq: Four times a day (QID) | ORAL | Status: DC | PRN

## 2014-09-28 MED ORDER — ACETAMINOPHEN 650 MG RE SUPP: 650.0000 mg | Freq: Four times a day (QID) | RECTAL | Status: DC | PRN

## 2014-09-28 MED ORDER — AMLODIPINE BESYLATE 10 MG PO TABS
10.0000 mg | ORAL_TABLET | Freq: Every day | ORAL | Status: DC
  Administered 2014-09-28 – 2014-09-29 (×2): 10 mg via ORAL
  Filled 2014-09-28 (×3): qty 1

## 2014-09-28 MED ORDER — HYDROCODONE-ACETAMINOPHEN 5-325 MG PO TABS
1.0000 | ORAL_TABLET | ORAL | Status: DC | PRN
  Administered 2014-09-29: 1 via ORAL
  Filled 2014-09-28: qty 1

## 2014-09-28 MED ORDER — FENTANYL CITRATE (PF) 250 MCG/5ML IJ SOLN
INTRAMUSCULAR | Status: AC
Start: 2014-09-28 — End: 2014-09-28
  Filled 2014-09-28: qty 5

## 2014-09-28 NOTE — H&P (Signed)
[name] History and Physical  Tracy [name] [id number] DOB: 02-19-1985 DOA: 09/27/2014  Referring physician: Ms.Szekalsiki. PCP: [name]  Specialists: Dr. Megan Salon from infectious disease. Dr. Percell Miller from orthopedic surgery.  Chief Complaint: Right thigh pain.  HPI: Tracy [name] is a 29 y.o. female who was recently admitted last month for myositis and was found to have MSSA bacteremia and had finished course of antibiotics started experiencing right lower extremity pain last week. Patient hadn't followed up with orthopedic surgery and infectious disease and a head MRI done at an outside facility and as per the notes from infectious disease consultant was showing loculated fluid collection concerning for phlegmon. Patient was advised to come to the ER for further surgical debridement and management. Patient states the pain is being getting worsen. Presently patient does not look septic. Patient has been restarted on cefazolin by infectious disease and has received these doses during the dialysis. Patient otherwise denies any nausea vomiting abdominal pain that get chest pain or shortness of breath. Patient gets dialyzed on Tuesday Thursdays and Saturday.   Review of Systems: As presented in the history of presenting illness, rest negative.  Past Medical History  Diagnosis Date  . Thyroid enlargement     "not on medication at this time"  . Urinary tract infection     hx of  . Anemia     presently on iron supplement  . Diabetes mellitus     insulin pump   . Hypothyroidism   . Anxiety   . HSV-2 (herpes simplex virus 2) infection   . HSV-1 (herpes simplex virus 1) infection   . Detached retina   . Yeast infection     took diflucan saturday   . Hypertension   . Irregular periods 07/05/2014  . Vaginal discharge 07/05/2014   Past Surgical History  Procedure Laterality Date  . Cholecystectomy    . Pilonidal cyst excision    . Wisdom tooth extraction     . Pars plana vitrectomy  10/01/2011    Procedure: PARS PLANA VITRECTOMY WITH 25 GAUGE;  Surgeon: Hayden Pedro, MD;  Location: [place];  Service: Ophthalmology;  Laterality: Right;  Repair Complex Traction Retinal Detachment  . Trigger finger release Right 09/21/13  . I&d extremity Right 08/14/2014    Procedure: IRRIGATION AND DEBRIDEMENT EXTREMITY WITH MUSCLE BIOPSY;  Surgeon: Roseanne Kaufman, MD;  Location: [place];  Service: Orthopedics;  Laterality: Right;  . I&d extremity Right 08/20/2014    Procedure:  I AND D LEG / THIGH ABSCESS AND MANIPULATION OF RIGHT ELBOW.;  Surgeon: Renette Butters, MD;  Location: [place];  Service: Orthopedics;  Laterality: Right;  . Av fistula placement Left 09/01/2014    Procedure: BRACHIOCEPHALIC ARTERIOVENOUS (AV) FISTULA CREATION;  Surgeon: Conrad [place], MD;  Location: [place];  Service: Vascular;  Laterality: Left;  . Insertion of dialysis catheter N/A 09/01/2014    Procedure: INSERTION OF DIALYSIS CATHETER IN RIGHT INTERNAL JUGUILAR;  Surgeon: Conrad [place], MD;  Location: [place];  Service: Vascular;  Laterality: N/A;   Social History:  reports that she has never smoked. She has never used smokeless tobacco. She reports that she does not drink alcohol or use illicit drugs. Where does patient live home. Can patient participate in ADLs? Yes.  Allergies  Allergen Reactions  . Penicillins Hives and Swelling  . Sulfa Antibiotics Hives    Family History:  Family History  Problem Relation Age of Onset  . Cancer Paternal Grandfather  prostate  . Hyperlipidemia Paternal Grandfather   . Stroke Paternal Grandfather       Prior to Admission medications   Medication Sig Start Date End Date Taking? Authorizing Provider  acyclovir (ZOVIRAX) 400 MG tablet Take 400 mg by mouth 2 (two) times daily as needed.   Yes Historical Provider, MD  amLODipine (NORVASC) 10 MG tablet Take 10 mg by mouth daily.   Yes Historical Provider, MD  calcitRIOL (ROCALTROL) 0.5 MCG capsule Take  1 capsule (0.5 mcg total) by mouth daily. 09/02/14  Yes Theodis Blaze, MD  HYDROcodone-acetaminophen (NORCO/VICODIN) 5-325 MG per tablet Take 1 tablet by mouth every 4 (four) hours as needed for moderate pain. 09/02/14  Yes Theodis Blaze, MD  insulin aspart (NOVOLOG FLEXPEN) 100 UNIT/ML FlexPen Inject 7 Units into the skin 3 (three) times daily with meals. Patient taking differently: Inject 5 Units into the skin 3 (three) times daily with meals.  09/02/14  Yes Theodis Blaze, MD  Insulin Glargine (LANTUS SOLOSTAR) 100 UNIT/ML Solostar Pen Inject 28 Units into the skin at bedtime. Patient taking differently: Inject 18 Units into the skin at bedtime.  09/02/14  Yes Theodis Blaze, MD  levothyroxine (SYNTHROID, LEVOTHROID) 75 MCG tablet Take 75 mcg by mouth daily. 09/02/14  Yes Historical Provider, MD  multivitamin (RENA-VIT) TABS tablet Take 1 tablet by mouth at bedtime. 09/02/14  Yes Theodis Blaze, MD  OxyCODONE (OXYCONTIN) 20 mg T12A 12 hr tablet Take 1 tablet (20 mg total) by mouth every 12 (twelve) hours. 09/02/14  Yes Theodis Blaze, MD  sevelamer carbonate (RENVELA) 800 MG tablet Take 800 mg by mouth 3 (three) times daily with meals.   Yes Historical Provider, MD  barrier cream (NON-SPECIFIED) CREA Apply 1 application topically 2 (two) times daily as needed. Patient not taking: Reported on 09/27/2014 09/02/14   Theodis Blaze, MD  cephALEXin (KEFLEX) 500 MG capsule Take 1 capsule (500 mg total) by mouth 2 (two) times daily. Patient not taking: Reported on 09/27/2014 09/22/14   Michel Bickers, MD  levothyroxine (SYNTHROID, LEVOTHROID) 88 MCG tablet Take 1 tablet (88 mcg total) by mouth daily before breakfast. Patient not taking: Reported on 09/27/2014 01/25/14   Verlee Monte, MD  megestrol (MEGACE) 40 MG/ML suspension Take 6 mLs (240 mg total) by mouth daily. Patient not taking: Reported on 09/27/2014 09/22/14   Michel Bickers, MD  promethazine (PHENERGAN) 12.5 MG tablet Take 1 tablet (12.5 mg total) by mouth every  4 (four) hours as needed for nausea or vomiting. Patient not taking: Reported on 09/27/2014 09/02/14   Theodis Blaze, MD  zolpidem (AMBIEN) 5 MG tablet Take 1 tablet (5 mg total) by mouth at bedtime as needed for sleep. Patient not taking: Reported on 09/22/2014 09/02/14   Theodis Blaze, MD    Physical Exam: Filed Vitals:   09/27/14 2200 09/27/14 2230 09/27/14 2245 09/27/14 2308  BP: 130/68 140/72 121/65 127/82  Pulse: 90 89 86 94  Temp:    98.9 F (37.2 C)  TempSrc:    Oral  Resp:    18  Height:    5\' 3"  (1.6 m)  Weight:    64.8 kg (142 lb 13.7 oz)  SpO2: 98% 100% 100% 100%     General:  Moderately built and nourished.  Eyes: Anicteric no pallor.  ENT: No discharge from the ears eyes nose and mouth.  Neck: No mass felt.  Cardiovascular: S1-S2 heard.  Respiratory: No rhonchi or crepitations.  Abdomen: Soft  nontender bowel sounds present.  Skin: No skin rash.  Musculoskeletal: Swelling of the right lower extremity extending up to the ankle. Pain on moving the right knee.  Psychiatric: Appears normal.  Neurologic: Alert oriented to time place and person. Moves all extremities.  Labs on Admission:  Basic Metabolic Panel:  Recent Labs Lab 09/27/14 2025  NA 137  K 3.5  CL 97*  CO2 27  GLUCOSE 212*  BUN 13  CREATININE 3.31*  CALCIUM 9.5   Liver Function Tests:  Recent Labs Lab 09/27/14 2025  AST 24  ALT 6*  ALKPHOS 97  BILITOT 0.5  PROT 8.1  ALBUMIN 3.4*   No results for input(s): LIPASE, AMYLASE in the last 168 hours. No results for input(s): AMMONIA in the last 168 hours. CBC:  Recent Labs Lab 09/27/14 2025  WBC 8.7  NEUTROABS 6.9  HGB 10.3*  HCT 31.8*  MCV 88.6  PLT 299   Cardiac Enzymes: No results for input(s): CKTOTAL, CKMB, CKMBINDEX, TROPONINI in the last 168 hours.  BNP (last 3 results) No results for input(s): BNP in the last 8760 hours.  ProBNP (last 3 results) No results for input(s): PROBNP in the last 8760  hours.  CBG:  Recent Labs Lab 09/27/14 2325  GLUCAP 205*    Radiological Exams on Admission: No results found.   Assessment/Plan Principal Problem:   Myositis Active Problems:   Hypothyroidism   DM I (diabetes mellitus, type I), uncontrolled   Essential hypertension   Chronic anemia   Lower extremity pain, right   1. Right lower extremity pain with possible recurrence of myositis with abscess and phlegmon - appreciate orthopedic surgery consult. Patient will be kept nothing by mouth past midnight for possible surgical procedure in a.m. I have continued patient on cefazolin for now. Further antibiotics dose will be based on cultures obtained during the surgical procedure and blood cultures. Consult infectious disease in a.m. 2. ESRD on hemodialysis - Tuesday Thursday and Saturday. Patient was recently started on dialysis last month. Consult nephrology for dialysis. 3. Diabetes mellitus type 1 - patient usually takes insulin 18 units at bedtime. As patient is nothing by mouth I have placed patient on Lantus 12 units now and may change her home dose after surgery and once patient starts eating. Sliding scale coverage. 4. Hypertension - continue home medications. 5. Hypothyroidism - on Synthroid. 6. Chronic anemia - follow CBC.  I have reviewed patient's old charts are labs and reviewed orthopedic surgery consult and recommendations.  Since patient has swelling extending up to the ankle I have ordered Dopplers to rule out DVT.   DVT Prophylaxis SCDs.  Code Status: Full code.  Family Communication: Patient's mother at the bedside.  Disposition Plan: Admit to inpatient.    Ziv Welchel N. [name] Pager [phone].  If 7PM-7AM, please contact night-coverage [email or web address] Password [email or web address] 09/28/2014, 12:20 AM

## 2014-09-28 NOTE — Progress Notes (Signed)
29 year old female admitted for recurrent right thigh pain and recurrence of POLYMYOSITIS.  She was admitted by Dr Hal Hope earlier today, see his note in detail.  Resume IV antibiotics and increased her pain meds.   Hosie Poisson MD [phone]

## 2014-09-28 NOTE — Assessment & Plan Note (Signed)
Her recent MRI and today's ultrasound both show complex fluid collections in her right thigh. This flared up shortly after completing recent antibiotic therapy for MSSA soft tissue infection. I will continue cefazolin and await the results of needle aspiration.

## 2014-09-28 NOTE — Consult Note (Signed)
[facility] Renal Consultation Note  Indication for Consultation:  Management of ESRD/hemodialysis; anemia, hypertension/volume and secondary hyperparathyroidism  HPI: Tracy [name] is a 29 y.o. female admitted with Right lower extremity pain ( r thigh and knee pain ) with possible recurrence of myositis with abscess. HO admit last month for MSSA Bacteremia and  Myositis(Started HD in [place]) and has [facility]f  [facility]/ started experiencing right lower extremity pain last week. Noted per  hpi =she had MRI done at an outside facility and as per the notes from infectious disease consultant was showing loculated fluid collection concerning for phlegmon.    She is on HD (ESRD [facility])TTS ([facility]) has been on Ancef since last dc at op [facility]. Denies fevers, chills, N/V , reports some constipation , related to Pain meds per pt. Has L U AVF(09/01/14 Insert) ("was to see VVS 19th in OV") and permcath used at hd without any  reported HD problems.Now in room awaiting IR aspiration of thigh and knee.    Past Medical History  Diagnosis Date  . Thyroid enlargement     "not on medication at this time"  . Urinary tract infection     hx of  . Anemia     presently on iron supplement  . Diabetes mellitus     insulin pump   . Hypothyroidism   . Anxiety   . HSV-2 (herpes simplex virus 2) infection   . HSV-1 (herpes simplex virus 1) infection   . Detached retina   . Yeast infection     took diflucan saturday   . Hypertension   . Irregular periods 07/05/2014  . Vaginal discharge 07/05/2014    Past Surgical History  Procedure Laterality Date  . Cholecystectomy    . Pilonidal cyst excision    . Wisdom tooth extraction    . Pars plana vitrectomy  10/01/2011    Procedure: PARS PLANA VITRECTOMY WITH 25 GAUGE;  Surgeon: Hayden Pedro, MD;  Location: [place];  Service: Ophthalmology;  Laterality: Right;  Repair Complex Traction Retinal Detachment  . Trigger finger  release Right 09/21/13  . I&d extremity Right 08/14/2014    Procedure: IRRIGATION AND DEBRIDEMENT EXTREMITY WITH MUSCLE BIOPSY;  Surgeon: Roseanne Kaufman, MD;  Location: [place];  Service: Orthopedics;  Laterality: Right;  . I&d extremity Right 08/20/2014    Procedure:  I AND D LEG / THIGH ABSCESS AND MANIPULATION OF RIGHT ELBOW.;  Surgeon: Renette Butters, MD;  Location: [place];  Service: Orthopedics;  Laterality: Right;  . Av fistula placement Left 09/01/2014    Procedure: BRACHIOCEPHALIC ARTERIOVENOUS (AV) FISTULA CREATION;  Surgeon: Conrad [place], MD;  Location: [place];  Service: Vascular;  Laterality: Left;  . Insertion of dialysis catheter N/A 09/01/2014    Procedure: INSERTION OF DIALYSIS CATHETER IN RIGHT INTERNAL JUGUILAR;  Surgeon: Conrad , MD;  Location: [facility];  Service: Vascular;  Laterality: N/A;      Family History  Problem Relation Age of Onset  . Cancer Paternal Grandfather     prostate  . Hyperlipidemia Paternal Grandfather   . Stroke Paternal Grandfather       reports that she has never smoked. She has never used smokeless tobacco. She reports that she does not drink alcohol or use illicit drugs.   Allergies  Allergen Reactions  . Penicillins Hives and Swelling  . Sulfa Antibiotics Hives    Prior to Admission medications   Medication Sig Start  Date End Date Taking? Authorizing Provider  acyclovir (ZOVIRAX) 400 MG tablet Take 400 mg by mouth 2 (two) times daily as needed.   Yes Historical Provider, MD  amLODipine (NORVASC) 10 MG tablet Take 10 mg by mouth daily.   Yes Historical Provider, MD  calcitRIOL (ROCALTROL) 0.5 MCG capsule Take 1 capsule (0.5 mcg total) by mouth daily. 09/02/14  Yes Theodis Blaze, MD  HYDROcodone-acetaminophen (NORCO/VICODIN) 5-325 MG per tablet Take 1 tablet by mouth every 4 (four) hours as needed for moderate pain. 09/02/14  Yes Theodis Blaze, MD  insulin aspart (NOVOLOG FLEXPEN) 100 UNIT/ML FlexPen Inject 7 Units into the skin 3 (three) times  daily with meals. Patient taking differently: Inject 5 Units into the skin 3 (three) times daily with meals.  09/02/14  Yes Theodis Blaze, MD  Insulin Glargine (LANTUS SOLOSTAR) 100 UNIT/ML Solostar Pen Inject 28 Units into the skin at bedtime. Patient taking differently: Inject 18 Units into the skin at bedtime.  09/02/14  Yes Theodis Blaze, MD  levothyroxine (SYNTHROID, LEVOTHROID) 75 MCG tablet Take 75 mcg by mouth daily. 09/02/14  Yes Historical Provider, MD  multivitamin (RENA-VIT) TABS tablet Take 1 tablet by mouth at bedtime. 09/02/14  Yes Theodis Blaze, MD  OxyCODONE (OXYCONTIN) 20 mg T12A 12 hr tablet Take 1 tablet (20 mg total) by mouth every 12 (twelve) hours. 09/02/14  Yes Theodis Blaze, MD  sevelamer carbonate (RENVELA) 800 MG tablet Take 800 mg by mouth 3 (three) times daily with meals.   Yes Historical Provider, MD  barrier cream (NON-SPECIFIED) CREA Apply 1 application topically 2 (two) times daily as needed. Patient not taking: Reported on 09/27/2014 09/02/14   Theodis Blaze, MD  cephALEXin (KEFLEX) 500 MG capsule Take 1 capsule (500 mg total) by mouth 2 (two) times daily. Patient not taking: Reported on 09/27/2014 09/22/14   Michel Bickers, MD  levothyroxine (SYNTHROID, LEVOTHROID) 88 MCG tablet Take 1 tablet (88 mcg total) by mouth daily before breakfast. Patient not taking: Reported on 09/27/2014 01/25/14   Verlee Monte, MD  megestrol (MEGACE) 40 MG/ML suspension Take 6 mLs (240 mg total) by mouth daily. Patient not taking: Reported on 09/27/2014 09/22/14   Michel Bickers, MD  promethazine (PHENERGAN) 12.5 MG tablet Take 1 tablet (12.5 mg total) by mouth every 4 (four) hours as needed for nausea or vomiting. Patient not taking: Reported on 09/27/2014 09/02/14   Theodis Blaze, MD  zolpidem (AMBIEN) 5 MG tablet Take 1 tablet (5 mg total) by mouth at bedtime as needed for sleep. Patient not taking: Reported on 09/22/2014 09/02/14   Theodis Blaze, MD     Anti-infectives    Start     Dose/Rate  Route Frequency Ordered Stop   09/28/14 1300  ceFAZolin (ANCEF) IVPB 2 g/50 mL premix     2 g 100 mL/hr over 30 Minutes Intravenous Every 12 hours 09/28/14 0035        Results for orders placed or performed during the hospital encounter of 09/27/14 (from the past 48 hour(s))  Comprehensive metabolic panel     Status: Abnormal   Collection Time: 09/27/14  8:25 PM  Result Value Ref Range   Sodium 137 135 - 145 mmol/L   Potassium 3.5 3.5 - 5.1 mmol/L   Chloride 97 (L) 101 - 111 mmol/L   CO2 27 22 - 32 mmol/L   Glucose, Bld 212 (H) 65 - 99 mg/dL   BUN 13 6 - 20 mg/dL  Creatinine, Ser 3.31 (H) 0.44 - 1.00 mg/dL   Calcium 9.5 8.9 - 10.3 mg/dL   Total Protein 8.1 6.5 - 8.1 g/dL   Albumin 3.4 (L) 3.5 - 5.0 g/dL   AST 24 15 - 41 U/L   ALT 6 (L) 14 - 54 U/L   Alkaline Phosphatase 97 38 - 126 U/L   Total Bilirubin 0.5 0.3 - 1.2 mg/dL   GFR calc non Af Amer 18 (L) >60 mL/min   GFR calc Af Amer 20 (L) >60 mL/min    Comment: (NOTE) The eGFR has been calculated using the CKD EPI equation. This calculation has not been validated in all clinical situations. eGFR's persistently <60 mL/min signify possible Chronic Kidney Disease.    Anion gap 13 5 - 15  CBC with Differential     Status: Abnormal   Collection Time: 09/27/14  8:25 PM  Result Value Ref Range   WBC 8.7 4.0 - 10.5 K/uL   RBC 3.59 (L) 3.87 - 5.11 MIL/uL   Hemoglobin 10.3 (L) 12.0 - 15.0 g/dL   HCT 31.8 (L) 36.0 - 46.0 %   MCV 88.6 78.0 - 100.0 fL   MCH 28.7 26.0 - 34.0 pg   MCHC 32.4 30.0 - 36.0 g/dL   RDW 14.3 11.5 - 15.5 %   Platelets 299 150 - 400 K/uL   Neutrophils Relative % 79 (H) 43 - 77 %   Neutro Abs 6.9 1.7 - 7.7 K/uL   Lymphocytes Relative 11 (L) 12 - 46 %   Lymphs Abs 1.0 0.7 - 4.0 K/uL   Monocytes Relative 8 3 - 12 %   Monocytes Absolute 0.7 0.1 - 1.0 K/uL   Eosinophils Relative 1 0 - 5 %   Eosinophils Absolute 0.1 0.0 - 0.7 K/uL   Basophils Relative 1 0 - 1 %   Basophils Absolute 0.1 0.0 - 0.1 K/uL   I-Stat CG4 Lactic Acid, ED  (not at [facility])     Status: None   Collection Time: 09/27/14  8:37 PM  Result Value Ref Range   Lactic Acid, Venous 0.65 0.5 - 2.0 mmol/L  Glucose, capillary     Status: Abnormal   Collection Time: 09/27/14 11:25 PM  Result Value Ref Range   Glucose-Capillary 205 (H) 65 - 99 mg/dL  Surgical pcr screen     Status: None   Collection Time: 09/28/14  1:31 AM  Result Value Ref Range   MRSA, PCR NEGATIVE NEGATIVE   Staphylococcus aureus NEGATIVE NEGATIVE    Comment:        The Xpert SA Assay (FDA approved for NASAL specimens in patients over 50 years of age), is one component of a comprehensive surveillance program.  Test performance has been validated by [facility] for patients greater than or equal to 86 year old. It is not intended to diagnose infection nor to guide or monitor treatment.   Glucose, capillary     Status: Abnormal   Collection Time: 09/28/14  5:10 AM  Result Value Ref Range   Glucose-Capillary 106 (H) 65 - 99 mg/dL  Comprehensive metabolic panel     Status: Abnormal   Collection Time: 09/28/14  5:50 AM  Result Value Ref Range   Sodium 139 135 - 145 mmol/L   Potassium 3.3 (L) 3.5 - 5.1 mmol/L   Chloride 102 101 - 111 mmol/L   CO2 28 22 - 32 mmol/L   Glucose, Bld 103 (H) 65 - 99 mg/dL   BUN 17 6 -  20 mg/dL   Creatinine, Ser 3.94 (H) 0.44 - 1.00 mg/dL   Calcium 9.1 8.9 - 10.3 mg/dL   Total Protein 6.6 6.5 - 8.1 g/dL   Albumin 2.7 (L) 3.5 - 5.0 g/dL   AST 18 15 - 41 U/L   ALT <5 (L) 14 - 54 U/L   Alkaline Phosphatase 78 38 - 126 U/L   Total Bilirubin 0.2 (L) 0.3 - 1.2 mg/dL   GFR calc non Af Amer 14 (L) >60 mL/min   GFR calc Af Amer 17 (L) >60 mL/min    Comment: (NOTE) The eGFR has been calculated using the CKD EPI equation. This calculation has not been validated in all clinical situations. eGFR's persistently <60 mL/min signify possible Chronic Kidney Disease.    Anion gap 9 5 - 15  CBC WITH DIFFERENTIAL     Status:  Abnormal   Collection Time: 09/28/14  5:50 AM  Result Value Ref Range   WBC 5.7 4.0 - 10.5 K/uL   RBC 3.22 (L) 3.87 - 5.11 MIL/uL   Hemoglobin 9.1 (L) 12.0 - 15.0 g/dL   HCT 28.7 (L) 36.0 - 46.0 %   MCV 89.1 78.0 - 100.0 fL   MCH 28.3 26.0 - 34.0 pg   MCHC 31.7 30.0 - 36.0 g/dL   RDW 14.4 11.5 - 15.5 %   Platelets 222 150 - 400 K/uL   Neutrophils Relative % 61 43 - 77 %   Neutro Abs 3.5 1.7 - 7.7 K/uL   Lymphocytes Relative 23 12 - 46 %   Lymphs Abs 1.3 0.7 - 4.0 K/uL   Monocytes Relative 12 3 - 12 %   Monocytes Absolute 0.7 0.1 - 1.0 K/uL   Eosinophils Relative 3 0 - 5 %   Eosinophils Absolute 0.2 0.0 - 0.7 K/uL   Basophils Relative 1 0 - 1 %   Basophils Absolute 0.1 0.0 - 0.1 K/uL  Glucose, capillary     Status: None   Collection Time: 09/28/14  9:18 AM  Result Value Ref Range   Glucose-Capillary 74 65 - 99 mg/dL   Comment 1 Notify RN    Comment 2 Document in Chart      MZT:AEWY pos as in hpi    Physical Exam: Filed Vitals:   09/28/14 0508  BP: 124/60  Pulse: 78  Temp: 99.3 F (37.4 C)  Resp: 18     General: alert pleasant  young WF  HEENT: Trail, MMM, N[place]ter[place] Neck: no jvd, supple  Heart: RRR, no mur , rub or gallop Lungs: CTA  bilat , non labored  Abdomen: BS pos. Soft NT, ND Extremities: 1+ R pedal edema with some swelling of R patella  Skin: no overt rash  Neuro: ox3 , pain with r lower exrem movements  Dialysis Access: pos bruit L UA AVF/ R IJ perm cath  Dialysis Orders: [facility]  on TTS . EDW 62.5 and leaving below HD Bath 2k,2ca  Time 4hrs Heparin 7600. Access R ij p.cath/ LUA AVF       mircera 50 mcg q2wks  hd     Assessment/Plan  1. Right Knee and Thigh  pain with possible recurrence of myositis with abscess and phlegmon- Per admit team with Ortho(dr. Percell Miller seeing / ID to see/ 2.  ESRD -  HD on normal schedule TTS , no hd needs today , k and volume stable  3. Hypertension/volume  - bp stable / lowering edw as op /some related to decr  appetite and  new ESRD pt  4. Anemia  - hgb 9.1 on esa and fe weekly  hd 5. Metabolic bone disease -  Renvela binder  and no vit d 6. Nutrition - renal/carb mod diet and renal vit . 7. IDDM Type 1 - per admt  Ernest Haber, PA-C [place] [phone] 09/28/2014, 11:08 AM

## 2014-09-28 NOTE — Progress Notes (Signed)
VASCULAR LAB PRELIMINARY  PRELIMINARY  PRELIMINARY  PRELIMINARY  Right lower extremity venous duplex completed.    Preliminary report:  Right:  No evidence of DVT, superficial thrombosis, or Baker's cyst.  Kedric Bumgarner, RVS 09/28/2014, 2:20 PM

## 2014-09-28 NOTE — H&P (Signed)
Chief Complaint: Patient was seen in consultation today for  Chief Complaint  Patient presents with  . Leg Problem   at the request of Dr.  Percell Miller  Referring Physician(s): Dr. Percell Miller  History of Present Illness: Tracy [name] is a 29 y.o. female with PMHx of diabetes and is on dialysis was recently admitted last month for myositis and was found to have MSSA bacteremia and had finished course of antibiotics started experiencing right lower extremity pain last week.  She had an incision and drainage of her right thigh by Dr. Percell Miller on July 9th.  She hadn't followed up with orthopedic surgery and infectious disease and a head MRI done at an outside facility and as per the notes from infectious disease consultant was showing loculated fluid collection concerning for phlegmon.   She was advised to come to the ER for further surgical debridement and management.   She states the pain has gotten much worse. She her knee hurts much worse than her thigh. She states she cannot stand.  She currently is stable and does not look septic.   She has been restarted on cefazolin by infectious disease and has received these doses during the dialysis.    Past Medical History  Diagnosis Date  . Thyroid enlargement     "not on medication at this time"  . Urinary tract infection     hx of  . Anemia     presently on iron supplement  . Diabetes mellitus     insulin pump   . Hypothyroidism   . Anxiety   . HSV-2 (herpes simplex virus 2) infection   . HSV-1 (herpes simplex virus 1) infection   . Detached retina   . Yeast infection     took diflucan saturday   . Hypertension   . Irregular periods 07/05/2014  . Vaginal discharge 07/05/2014    Past Surgical History  Procedure Laterality Date  . Cholecystectomy    . Pilonidal cyst excision    . Wisdom tooth extraction    . Pars plana vitrectomy  10/01/2011    Procedure: PARS PLANA VITRECTOMY WITH 25 GAUGE;  Surgeon: Hayden Pedro,  MD;  Location: [place];  Service: Ophthalmology;  Laterality: Right;  Repair Complex Traction Retinal Detachment  . Trigger finger release Right 09/21/13  . I&d extremity Right 08/14/2014    Procedure: IRRIGATION AND DEBRIDEMENT EXTREMITY WITH MUSCLE BIOPSY;  Surgeon: Roseanne Kaufman, MD;  Location: [place];  Service: Orthopedics;  Laterality: Right;  . I&d extremity Right 08/20/2014    Procedure:  I AND D LEG / THIGH ABSCESS AND MANIPULATION OF RIGHT ELBOW.;  Surgeon: Renette Butters, MD;  Location: [place];  Service: Orthopedics;  Laterality: Right;  . Av fistula placement Left 09/01/2014    Procedure: BRACHIOCEPHALIC ARTERIOVENOUS (AV) FISTULA CREATION;  Surgeon: Conrad Geneva, MD;  Location: [place];  Service: Vascular;  Laterality: Left;  . Insertion of dialysis catheter N/A 09/01/2014    Procedure: INSERTION OF DIALYSIS CATHETER IN RIGHT INTERNAL JUGUILAR;  Surgeon: Conrad Penn, MD;  Location: [facility] OR;  Service: Vascular;  Laterality: N/A;    Allergies: Penicillins and Sulfa antibiotics  Medications: Prior to Admission medications   Medication Sig Start Date End Date Taking? Authorizing Provider  acyclovir (ZOVIRAX) 400 MG tablet Take 400 mg by mouth 2 (two) times daily as needed.   Yes Historical Provider, MD  amLODipine (NORVASC) 10 MG tablet Take 10 mg by mouth daily.   Yes  Historical Provider, MD  calcitRIOL (ROCALTROL) 0.5 MCG capsule Take 1 capsule (0.5 mcg total) by mouth daily. 09/02/14  Yes Theodis Blaze, MD  HYDROcodone-acetaminophen (NORCO/VICODIN) 5-325 MG per tablet Take 1 tablet by mouth every 4 (four) hours as needed for moderate pain. 09/02/14  Yes Theodis Blaze, MD  insulin aspart (NOVOLOG FLEXPEN) 100 UNIT/ML FlexPen Inject 7 Units into the skin 3 (three) times daily with meals. Patient taking differently: Inject 5 Units into the skin 3 (three) times daily with meals.  09/02/14  Yes Theodis Blaze, MD  Insulin Glargine (LANTUS SOLOSTAR) 100 UNIT/ML Solostar Pen Inject 28 Units into the  skin at bedtime. Patient taking differently: Inject 18 Units into the skin at bedtime.  09/02/14  Yes Theodis Blaze, MD  levothyroxine (SYNTHROID, LEVOTHROID) 75 MCG tablet Take 75 mcg by mouth daily. 09/02/14  Yes Historical Provider, MD  multivitamin (RENA-VIT) TABS tablet Take 1 tablet by mouth at bedtime. 09/02/14  Yes Theodis Blaze, MD  OxyCODONE (OXYCONTIN) 20 mg T12A 12 hr tablet Take 1 tablet (20 mg total) by mouth every 12 (twelve) hours. 09/02/14  Yes Theodis Blaze, MD  sevelamer carbonate (RENVELA) 800 MG tablet Take 800 mg by mouth 3 (three) times daily with meals.   Yes Historical Provider, MD  barrier cream (NON-SPECIFIED) CREA Apply 1 application topically 2 (two) times daily as needed. Patient not taking: Reported on 09/27/2014 09/02/14   Theodis Blaze, MD  cephALEXin (KEFLEX) 500 MG capsule Take 1 capsule (500 mg total) by mouth 2 (two) times daily. Patient not taking: Reported on 09/27/2014 09/22/14   Michel Bickers, MD  levothyroxine (SYNTHROID, LEVOTHROID) 88 MCG tablet Take 1 tablet (88 mcg total) by mouth daily before breakfast. Patient not taking: Reported on 09/27/2014 01/25/14   Verlee Monte, MD  megestrol (MEGACE) 40 MG/ML suspension Take 6 mLs (240 mg total) by mouth daily. Patient not taking: Reported on 09/27/2014 09/22/14   Michel Bickers, MD  promethazine (PHENERGAN) 12.5 MG tablet Take 1 tablet (12.5 mg total) by mouth every 4 (four) hours as needed for nausea or vomiting. Patient not taking: Reported on 09/27/2014 09/02/14   Theodis Blaze, MD  zolpidem (AMBIEN) 5 MG tablet Take 1 tablet (5 mg total) by mouth at bedtime as needed for sleep. Patient not taking: Reported on 09/22/2014 09/02/14   Theodis Blaze, MD     Family History  Problem Relation Age of Onset  . Cancer Paternal Grandfather     prostate  . Hyperlipidemia Paternal Grandfather   . Stroke Paternal Grandfather     Social History   Social History  . Marital Status: Legally Separated    Spouse Name: Audelia Acton  .  Number of Children: 0  . Years of Education: College   Occupational History  .  [employer]   Social History Main Topics  . Smoking status: Never Smoker   . Smokeless tobacco: Never Used  . Alcohol Use: No  . Drug Use: No  . Sexual Activity: Not Currently    Birth Control/ Protection: None   Other Topics Concern  . None   Social History Narrative   Patient lives at home with family.   Caffeine Use: 1 soda daily     Review of Systems  Constitutional: Positive for activity change. Negative for fever, chills and appetite change.  Respiratory: Negative for cough, chest tightness and shortness of breath.   Cardiovascular: Negative for chest pain.  Gastrointestinal: Negative for nausea, vomiting and abdominal  pain.  Musculoskeletal:       Knee and thigh pain, unable to bear weight  Skin: Negative.   Neurological: Negative.   Psychiatric/Behavioral: Negative.     Vital Signs: BP 124/60 mmHg  Pulse 78  Temp(Src) 99.3 F (37.4 C) (Oral)  Resp 18  Ht 5\' 3"  (1.6 m)  Wt 140 lb 10.5 oz (63.8 kg)  BMI 24.92 kg/m2  SpO2 99%  LMP 09/12/2014  Physical Exam  Constitutional: She is oriented to person, place, and time. She appears well-developed and well-nourished.  HENT:  Head: Atraumatic.  Eyes: EOM are normal.  Neck: Normal range of motion. Neck supple.  Cardiovascular: Normal rate and regular rhythm.   Murmur heard. Pulmonary/Chest: Effort normal and breath sounds normal.  Abdominal: Soft. Bowel sounds are normal. There is no tenderness.  Musculoskeletal: Normal range of motion. She exhibits tenderness.  Right medial knee is very tender to palpation. 2+ swelling of the right knee. Very mild erythema medial knee.  Neurological: She is alert and oriented to person, place, and time.  Skin: Skin is warm and dry.  Psychiatric: She has a normal mood and affect. Her behavior is normal. Judgment and thought content normal.  Vitals reviewed.   Mallampati Score:  MD  Evaluation Airway: WNL Heart: WNL Abdomen: WNL Chest/ Lungs: WNL ASA  Classification: 3 Mallampati/Airway Score: Two  Imaging: Dg Chest Port 1 View  09/01/2014   CLINICAL DATA:  Dialysis catheter insertion  EXAM: PORTABLE CHEST - 1 VIEW  COMPARISON:  Portable exam 1708 hours compared to 08/28/2014  FINDINGS: RIGHT jugular dual-lumen central venous catheter with tip projecting over inferior RIGHT atrium.  Enlargement of cardiac silhouette with vascular congestion.  Mediastinal contours normal.  Small RIGHT pleural effusion.  Persistent LEFT lower lobe opacity question atelectasis versus consolidation.  No pneumothorax.  IMPRESSION: No pneumothorax following RIGHT jugular line placement.  Small RIGHT pleural effusion with persistent atelectasis versus consolidation LEFT lower lobe.   Electronically Signed   By: Lavonia Dana M.D.   On: 09/01/2014 17:37    Labs:  CBC:  Recent Labs  09/01/14 2020 09/02/14 1027 09/27/14 2025 09/28/14 0550  WBC 13.6* 10.6* 8.7 5.7  HGB 8.8* 8.4* 10.3* 9.1*  HCT 28.8* 26.8* 31.8* 28.7*  PLT 391 365 299 222    COAGS:  Recent Labs  08/09/14 2350 08/16/14 0635  INR 1.31 1.40  APTT 42* 39*    BMP:  Recent Labs  09/01/14 2000 09/02/14 0519 09/27/14 2025 09/28/14 0550  NA 136 133* 137 139  K 5.1 5.0 3.5 3.3*  CL 100* 95* 97* 102  CO2 24 24 27 28   GLUCOSE 195* 503* 212* 103*  BUN 56* 43* 13 17  CALCIUM 8.7* 8.2* 9.5 9.1  CREATININE 7.28* 6.18* 3.31* 3.94*  GFRNONAA 7* 8* 18* 14*  GFRAA 8* 10* 20* 17*    LIVER FUNCTION TESTS:  Recent Labs  08/09/14 1819 08/10/14 0128  09/01/14 2000 09/02/14 0519 09/27/14 2025 09/28/14 0550  BILITOT 0.5 0.6  --   --   --  0.5 0.2*  AST 28 38  --   --   --  24 18  ALT 15 18  --   --   --  6* <5*  ALKPHOS 127* 166*  --   --   --  97 78  PROT 7.6 5.7*  --   --   --  8.1 6.6  ALBUMIN 2.9* 2.2*  < > 2.1* 2.0* 3.4* 2.7*  < > =  values in this interval not displayed.  TUMOR MARKERS: No results for  input(s): AFPTM, CEA, CA199, CHROMGRNA in the last 8760 hours.  Assessment and Plan:  Fluid collection/possible abscess right thigh and knee.  Discussed case with Dr. Percell Miller and with Dr. Earleen Newport  Will proceed with US guided aspiration of thigh and knee today.  .Risks and Benefits discussed with the patient including bleeding, infection, damage to adjacent structures, bowel perforation/fistula connection, and sepsis. All of the patient's questions were answered, patient is agreeable to proceed. Consent signed and in chart.  Thank you for this interesting consult.  I greatly enjoyed meeting Tracy [name] and look forward to participating in their care.  A copy of this report was sent to the requesting provider on this date.  Signed: Murrell Redden PA-C 09/28/2014, 10:19 AM   I spent a total of 20 Minutes   in face to face in clinical consultation, greater than 50% of which was counseling/coordinating care for US guided aspiration of thigh and knee.

## 2014-09-28 NOTE — Progress Notes (Signed)
ANTIBIOTIC CONSULT NOTE - INITIAL  Pharmacy Consult for Cefazolin  Indication: Cellulitis   Allergies  Allergen Reactions  . Penicillins Hives and Swelling  . Sulfa Antibiotics Hives    Patient Measurements: Height: 5\' 3"  (160 cm) Weight: 142 lb 13.7 oz (64.8 kg) IBW/kg (Calculated) : 52.4  Vital Signs: Temp: 98.9 F (37.2 C) (08/16 2308) Temp Source: Oral (08/16 2308) BP: 127/82 mmHg (08/16 2308) Pulse Rate: 94 (08/16 2308) Intake/Output from previous day:   Intake/Output from this shift:    Labs:  Recent Labs  09/27/14 2025  WBC 8.7  HGB 10.3*  PLT 299  CREATININE 3.31*   Estimated Creatinine Clearance: 22.5 mL/min (by C-G formula based on Cr of 3.31). No results for input(s): VANCOTROUGH, VANCOPEAK, VANCORANDOM, GENTTROUGH, GENTPEAK, GENTRANDOM, TOBRATROUGH, TOBRAPEAK, TOBRARND, AMIKACINPEAK, AMIKACINTROU, AMIKACIN in the last 72 hours.   Microbiology: Recent Results (from the past 720 hour(s))  Surgical pcr screen     Status: None   Collection Time: 09/01/14  5:59 AM  Result Value Ref Range Status   MRSA, PCR NEGATIVE NEGATIVE Final   Staphylococcus aureus NEGATIVE NEGATIVE Final    Comment:        The Xpert SA Assay (FDA approved for NASAL specimens in patients over 31 years of age), is one component of a comprehensive surveillance program.  Test performance has been validated by [facility] for patients greater than or equal to 31 year old. It is not intended to diagnose infection nor to guide or monitor treatment.     Medical History: Past Medical History  Diagnosis Date  . Thyroid enlargement     "not on medication at this time"  . Urinary tract infection     hx of  . Anemia     presently on iron supplement  . Diabetes mellitus     insulin pump   . Hypothyroidism   . Anxiety   . HSV-2 (herpes simplex virus 2) infection   . HSV-1 (herpes simplex virus 1) infection   . Detached retina   . Yeast infection     took diflucan saturday    . Hypertension   . Irregular periods 07/05/2014  . Vaginal discharge 07/05/2014    Medications:  Prescriptions prior to admission  Medication Sig Dispense Refill Last Dose  . acyclovir (ZOVIRAX) 400 MG tablet Take 400 mg by mouth 2 (two) times daily as needed.   2-3 weeks  . amLODipine (NORVASC) 10 MG tablet Take 10 mg by mouth daily.   09/26/2014 at Unknown time  . calcitRIOL (ROCALTROL) 0.5 MCG capsule Take 1 capsule (0.5 mcg total) by mouth daily. 30 capsule 1 09/26/2014 at Unknown time  . HYDROcodone-acetaminophen (NORCO/VICODIN) 5-325 MG per tablet Take 1 tablet by mouth every 4 (four) hours as needed for moderate pain. 60 tablet 0 09/27/2014 at 1130  . insulin aspart (NOVOLOG FLEXPEN) 100 UNIT/ML FlexPen Inject 7 Units into the skin 3 (three) times daily with meals. (Patient taking differently: Inject 5 Units into the skin 3 (three) times daily with meals. ) 15 mL 1 09/26/2014 at Unknown time  . Insulin Glargine (LANTUS SOLOSTAR) 100 UNIT/ML Solostar Pen Inject 28 Units into the skin at bedtime. (Patient taking differently: Inject 18 Units into the skin at bedtime. ) 15 mL 11 09/26/2014 at Unknown time  . levothyroxine (SYNTHROID, LEVOTHROID) 75 MCG tablet Take 75 mcg by mouth daily.  0 09/27/2014 at Unknown time  . multivitamin (RENA-VIT) TABS tablet Take 1 tablet by mouth at bedtime. 30 tablet  1 09/26/2014 at Unknown time  . OxyCODONE (OXYCONTIN) 20 mg T12A 12 hr tablet Take 1 tablet (20 mg total) by mouth every 12 (twelve) hours. 60 tablet 0 09/27/2014 at Unknown time  . sevelamer carbonate (RENVELA) 800 MG tablet Take 800 mg by mouth 3 (three) times daily with meals.   09/25/2014  . barrier cream (NON-SPECIFIED) CREA Apply 1 application topically 2 (two) times daily as needed. (Patient not taking: Reported on 09/27/2014) 1 each 1 Not Taking at Unknown time  . cephALEXin (KEFLEX) 500 MG capsule Take 1 capsule (500 mg total) by mouth 2 (two) times daily. (Patient not taking: Reported on 09/27/2014)  60 capsule 1 Not Taking at Unknown time  . levothyroxine (SYNTHROID, LEVOTHROID) 88 MCG tablet Take 1 tablet (88 mcg total) by mouth daily before breakfast. (Patient not taking: Reported on 09/27/2014) 30 tablet 0 Not Taking at Unknown time  . megestrol (MEGACE) 40 MG/ML suspension Take 6 mLs (240 mg total) by mouth daily. (Patient not taking: Reported on 09/27/2014) 240 mL 1 Not Taking at Unknown time  . promethazine (PHENERGAN) 12.5 MG tablet Take 1 tablet (12.5 mg total) by mouth every 4 (four) hours as needed for nausea or vomiting. (Patient not taking: Reported on 09/27/2014) 60 tablet 0 Not Taking at Unknown time  . zolpidem (AMBIEN) 5 MG tablet Take 1 tablet (5 mg total) by mouth at bedtime as needed for sleep. (Patient not taking: Reported on 09/22/2014) 30 tablet 0 Not Taking at Unknown time   Assessment: 29 YOF who complains of R thigh pain that began last Monday. She was recently hospitalized with septic myositis that required I&D of R arm and R thigh. She had completed a course of cephalexin prior to admission with no resolution of pain. Pharmacy consulted to start IV ancef with plans for possible I&D tomorrow. ID believes this is a relapse of her MSSA infection. WBC wnl. Afebrile. CrCl ~ 22 mL/min   Goal of Therapy:  Resolution of infection   Plan:  -Start cefazolin 2 gm IV Q 12 hours -Monitor CBC, renal fx, cultures and clinical progress  Albertina Parr, PharmD., BCPS Clinical Pharmacist Pager [phone]

## 2014-09-28 NOTE — Progress Notes (Addendum)
Patient ID: Tracy [name], female   DOB: [birth year], 29 y.o.   MRN: [id number]         [facility] for Infectious Disease    Date of Admission:  09/27/2014           Day 5 cefazolin ( second round of therapy)  Principal Problem:   Myositis Active Problems:   Hypothyroidism   DM I (diabetes mellitus, type I), uncontrolled   Essential hypertension   Chronic anemia   Lower extremity pain, right   Abscess   . amLODipine  10 mg Oral Daily  .  ceFAZolin (ANCEF) IV  2 g Intravenous Q12H  . [START ON 09/29/2014] darbepoetin (ARANESP) injection - DIALYSIS  100 mcg Intravenous Q Thu-HD  . [START ON 09/29/2014] ferric gluconate (FERRLECIT/NULECIT) IV  125 mg Intravenous Q Thu-HD  . insulin aspart  0-9 Units Subcutaneous Q4H  . insulin glargine  12 Units Subcutaneous QHS  . levothyroxine  75 mcg Oral QAC breakfast  . lidocaine (PF)      . multivitamin  1 tablet Oral QHS  . OxyCODONE  20 mg Oral Q12H  . sevelamer carbonate  800 mg Oral TID WC  . sodium chloride  500 mL Intravenous Once  . sodium chloride  3 mL Intravenous Q12H     Past Medical History  Diagnosis Date  . Thyroid enlargement     "not on medication at this time"  . Urinary tract infection     hx of  . Anemia     presently on iron supplement  . Diabetes mellitus     insulin pump   . Hypothyroidism   . Anxiety   . HSV-2 (herpes simplex virus 2) infection   . HSV-1 (herpes simplex virus 1) infection   . Detached retina   . Yeast infection     took diflucan saturday   . Hypertension   . Irregular periods 07/05/2014  . Vaginal discharge 07/05/2014    Social History  Substance Use Topics  . Smoking status: Never Smoker   . Smokeless tobacco: Never Used  . Alcohol Use: No    Family History  Problem Relation Age of Onset  . Cancer Paternal Grandfather     prostate  . Hyperlipidemia Paternal Grandfather   . Stroke Paternal Grandfather    Allergies  Allergen Reactions  . Penicillins Hives and  Swelling  . Sulfa Antibiotics Hives    OBJECTIVE: Filed Vitals:   09/27/14 2308 09/28/14 0508 09/28/14 1440 09/28/14 1446  BP: 127/82 124/60 150/71 150/71  Pulse: 94 78 84   Temp: 98.9 F (37.2 C) 99.3 F (37.4 C) 99 F (37.2 C)   TempSrc: Oral Oral Oral   Resp: 18 18 18    Height: 5\' 3"  (1.6 m)     Weight: 142 lb 13.7 oz (64.8 kg) 140 lb 10.5 oz (63.8 kg)    SpO2: 100% 99% 100%    Body mass index is 24.92 kg/(m^2).  General: she was out of her room in radiology when I came to see her today  Lab Results Lab Results  Component Value Date   WBC 5.7 09/28/2014   HGB 9.1* 09/28/2014   HCT 28.7* 09/28/2014   MCV 89.1 09/28/2014   PLT 222 09/28/2014    Lab Results  Component Value Date   CREATININE 3.94* 09/28/2014   BUN 17 09/28/2014   NA 139 09/28/2014   K 3.3* 09/28/2014   CL 102 09/28/2014   CO2 28 09/28/2014  Lab Results  Component Value Date   ALT <5* 09/28/2014   AST 18 09/28/2014   ALKPHOS 78 09/28/2014   BILITOT 0.2* 09/28/2014     Microbiology: Recent Results (from the past 240 hour(s))  Surgical pcr screen     Status: None   Collection Time: 09/28/14  1:31 AM  Result Value Ref Range Status   MRSA, PCR NEGATIVE NEGATIVE Final   Staphylococcus aureus NEGATIVE NEGATIVE Final    Comment:        The Xpert SA Assay (FDA approved for NASAL specimens in patients over 3 years of age), is one component of a comprehensive surveillance program.  Test performance has been validated by [facility] for patients greater than or equal to 34 year old. It is not intended to diagnose infection nor to guide or monitor treatment.    ULTRASOUND right LOWER EXTREMITY LIMITED 09/28/2014  TECHNIQUE: Ultrasound examination of the lower extremity soft tissues was performed in the area of clinical concern.  COMPARISON: MRI 08/12/2014  FINDINGS: Targeted ultrasound in 2 separate regions of clinical concern along the medial thigh and at the lateral knee.  Grayscale and color imaging performed.  There is a heterogeneously echoic, hypoechoic region in the medial thigh a near at a region of prior surgery. Largest collection at this site measures 3.4 cm x 1.0 cm, just subjacent to the skin surface.  There is a anechoic fluid collection with through transmission at the lateral knee without internal flow measuring 4.4 cm  IMPRESSION: Focal fluid collection at the lateral right knee, may represent hematoma, seroma, or abscess. In addition there is a focal region at the medial thigh and, underlying prior surgical incision with more complex features, potentially representing old hematoma, seroma, or abscess. Image guided aspiration appears possible.  Electronically Signed  By: Corrie Mckusick D.O.  On: 09/28/2014 15:47  Problem List Items Addressed This Visit      High   * (Principal)Myositis - Primary     Unprioritized   Abscess    Her recent MRI and today's ultrasound both show complex fluid collections in her right thigh. This flared up shortly after completing recent antibiotic therapy for MSSA soft tissue infection. I will continue cefazolin and await the results of needle aspiration.      Relevant Orders   US Aspiration   [place] Extrem Low Right Ltd (Completed)    Other Visit Diagnoses    Edema        Relevant Orders    VAS [place] LOWER EXTREMITY VENOUS (DVT) (Completed)        Michel Bickers, MD [facility] [place] [facility] [phone] pager   [phone] cell 09/28/2014, 4:46 PM

## 2014-09-28 NOTE — Procedures (Signed)
Interventional Radiology Procedure Note  Procedure: US guided aspiration of 2 sites of pain/inflammation of RLE.   1 = medial thigh, where there is post-op chages/scar. 2 = lateral knee.  Fluid collection  Findings:   1 = medial thigh.  No significant fluid.  Hypoechoic region is likely phelgmon/complex tissue.  1-2cc aspirated for cx. 2 = lateral thigh.  Fluid collection.  Possibly communicates with the joint space.  Difficult to prove with [place]. 10-15 cc of turbid fluid aspirated.  Complications: None Recommendations:  - Follow up cultures -   - Routine care   Signed,  Dulcy Fanny. Earleen Newport, DO

## 2014-09-29 ENCOUNTER — Encounter: Payer: Self-pay | Admitting: Vascular Surgery

## 2014-09-29 DIAGNOSIS — B9689 Other specified bacterial agents as the cause of diseases classified elsewhere: Secondary | ICD-10-CM

## 2014-09-29 DIAGNOSIS — L02416 Cutaneous abscess of left lower limb: Secondary | ICD-10-CM

## 2014-09-29 DIAGNOSIS — Z992 Dependence on renal dialysis: Secondary | ICD-10-CM

## 2014-09-29 DIAGNOSIS — M79652 Pain in left thigh: Secondary | ICD-10-CM

## 2014-09-29 DIAGNOSIS — M25561 Pain in right knee: Secondary | ICD-10-CM

## 2014-09-29 DIAGNOSIS — L0291 Cutaneous abscess, unspecified: Secondary | ICD-10-CM

## 2014-09-29 LAB — CBC WITH DIFFERENTIAL/PLATELET
BASOS ABS: 0 10*3/uL (ref 0.0–0.1)
Basophils Relative: 0 % (ref 0–1)
Eosinophils Absolute: 0.1 10*3/uL (ref 0.0–0.7)
Eosinophils Relative: 1 % (ref 0–5)
HEMATOCRIT: 27.9 % — AB (ref 36.0–46.0)
HEMOGLOBIN: 9.1 g/dL — AB (ref 12.0–15.0)
LYMPHS PCT: 10 % — AB (ref 12–46)
Lymphs Abs: 1 10*3/uL (ref 0.7–4.0)
MCH: 28.9 pg (ref 26.0–34.0)
MCHC: 32.6 g/dL (ref 30.0–36.0)
MCV: 88.6 fL (ref 78.0–100.0)
MONO ABS: 0.7 10*3/uL (ref 0.1–1.0)
Monocytes Relative: 6 % (ref 3–12)
NEUTROS ABS: 8.5 10*3/uL — AB (ref 1.7–7.7)
NEUTROS PCT: 83 % — AB (ref 43–77)
Platelets: 250 10*3/uL (ref 150–400)
RBC: 3.15 MIL/uL — AB (ref 3.87–5.11)
RDW: 14.1 % (ref 11.5–15.5)
WBC: 10.3 10*3/uL (ref 4.0–10.5)

## 2014-09-29 LAB — GLUCOSE, CAPILLARY
GLUCOSE-CAPILLARY: 116 mg/dL — AB (ref 65–99)
GLUCOSE-CAPILLARY: 57 mg/dL — AB (ref 65–99)
GLUCOSE-CAPILLARY: 68 mg/dL (ref 65–99)
Glucose-Capillary: 144 mg/dL — ABNORMAL HIGH (ref 65–99)
Glucose-Capillary: 179 mg/dL — ABNORMAL HIGH (ref 65–99)
Glucose-Capillary: 75 mg/dL (ref 65–99)
Glucose-Capillary: 86 mg/dL (ref 65–99)

## 2014-09-29 LAB — RENAL FUNCTION PANEL
ALBUMIN: 2.6 g/dL — AB (ref 3.5–5.0)
ANION GAP: 13 (ref 5–15)
BUN: 23 mg/dL — ABNORMAL HIGH (ref 6–20)
CO2: 26 mmol/L (ref 22–32)
Calcium: 9.2 mg/dL (ref 8.9–10.3)
Chloride: 99 mmol/L — ABNORMAL LOW (ref 101–111)
Creatinine, Ser: 5.29 mg/dL — ABNORMAL HIGH (ref 0.44–1.00)
GFR calc Af Amer: 12 mL/min — ABNORMAL LOW (ref >60)
GFR calc non Af Amer: 10 mL/min — ABNORMAL LOW (ref >60)
GLUCOSE: 88 mg/dL (ref 65–99)
PHOSPHORUS: 4.2 mg/dL (ref 2.5–4.6)
POTASSIUM: 3.5 mmol/L (ref 3.5–5.1)
Sodium: 138 mmol/L (ref 135–145)

## 2014-09-29 MED ORDER — SORBITOL 70 % SOLN: 30.0000 mL | Freq: Four times a day (QID) | Status: DC | PRN

## 2014-09-29 MED ORDER — HYDROMORPHONE HCL 1 MG/ML IJ SOLN
INTRAMUSCULAR | Status: AC
  Filled 2014-09-29: qty 2

## 2014-09-29 MED ORDER — CEFAZOLIN SODIUM 1-5 GM-% IV SOLN
1.0000 g | INTRAVENOUS | Status: AC
  Administered 2014-09-29: 1 g via INTRAVENOUS
  Filled 2014-09-29: qty 50

## 2014-09-29 MED ORDER — CEFAZOLIN SODIUM-DEXTROSE 2-3 GM-% IV SOLR
2.0000 g | INTRAVENOUS | Status: DC
  Filled 2014-09-29: qty 50

## 2014-09-29 MED ORDER — DARBEPOETIN ALFA 100 MCG/0.5ML IJ SOSY
PREFILLED_SYRINGE | INTRAMUSCULAR | Status: AC
  Filled 2014-09-29: qty 0.5

## 2014-09-29 MED ORDER — HYDROCODONE-ACETAMINOPHEN 5-325 MG PO TABS
2.0000 | ORAL_TABLET | ORAL | Status: DC | PRN
  Administered 2014-09-29 – 2014-09-30 (×3): 2 via ORAL
  Filled 2014-09-29 (×4): qty 2

## 2014-09-29 MED ORDER — POLYETHYLENE GLYCOL 3350 17 G PO PACK
17.0000 g | PACK | Freq: Every day | ORAL | Status: DC
  Administered 2014-09-29 – 2014-10-02 (×3): 17 g via ORAL
  Filled 2014-09-29 (×7): qty 1

## 2014-09-29 MED ORDER — INSULIN GLARGINE 100 UNIT/ML ~~LOC~~ SOLN
6.0000 [IU] | Freq: Every day | SUBCUTANEOUS | Status: DC
  Administered 2014-09-29 – 2014-10-02 (×4): 6 [IU] via SUBCUTANEOUS
  Filled 2014-09-29 (×5): qty 0.06

## 2014-09-29 MED ORDER — CEFAZOLIN SODIUM-DEXTROSE 2-3 GM-% IV SOLR
2.0000 g | INTRAVENOUS | Status: DC
  Administered 2014-10-01 – 2014-10-04 (×4): 2 g via INTRAVENOUS
  Filled 2014-09-29 (×4): qty 50

## 2014-09-29 MED ORDER — SENNOSIDES-DOCUSATE SODIUM 8.6-50 MG PO TABS
2.0000 | ORAL_TABLET | Freq: Two times a day (BID) | ORAL | Status: DC
  Administered 2014-09-29 – 2014-10-05 (×10): 2 via ORAL
  Filled 2014-09-29 (×12): qty 2

## 2014-09-29 NOTE — Progress Notes (Signed)
Pt complains of constipation. Reported to MD. Requested stool sofner/laxative order.

## 2014-09-29 NOTE — Progress Notes (Signed)
Patient ID: Tracy [name], female   DOB: [birth year], 29 y.o.   MRN: [id number]         [facility]    Date of Admission:  09/27/2014   Total days of antibiotics 6            Principal Problem:   Myositis Active Problems:   Hypothyroidism   DM I (diabetes mellitus, type I), uncontrolled   Essential hypertension   Chronic anemia   Lower extremity pain, right   Abscess   . amLODipine  10 mg Oral Daily  .  ceFAZolin (ANCEF) IV  1 g Intravenous Q T,Th,Sat-1800  . [START ON 10/01/2014]  ceFAZolin (ANCEF) IV  2 g Intravenous Q T,Th,Sat-1800  . darbepoetin (ARANESP) injection - DIALYSIS  100 mcg Intravenous Q Thu-HD  . ferric gluconate (FERRLECIT/NULECIT) IV  125 mg Intravenous Q Thu-HD  . HYDROmorphone      . insulin aspart  0-9 Units Subcutaneous Q4H  . insulin glargine  12 Units Subcutaneous QHS  . levothyroxine  75 mcg Oral QAC breakfast  . multivitamin  1 tablet Oral QHS  . OxyCODONE  20 mg Oral Q12H  . polyethylene glycol  17 g Oral Daily  . senna-docusate  2 tablet Oral BID  . sevelamer carbonate  800 mg Oral TID WC  . sodium chloride  500 mL Intravenous Once  . sodium chloride  3 mL Intravenous Q12H    Subjective: She is still having severe right thigh and knee pain.  Review of Systems: Pertinent items are noted in HPI.  Past Medical History  Diagnosis Date  . Thyroid enlargement     "not on medication at this time"  . Urinary tract infection     hx of  . Anemia     presently on iron supplement  . Diabetes mellitus     insulin pump   . Hypothyroidism   . Anxiety   . HSV-2 (herpes simplex virus 2) infection   . HSV-1 (herpes simplex virus 1) infection   . Detached retina   . Yeast infection     took diflucan saturday   . Hypertension   . Irregular periods 07/05/2014  . Vaginal discharge 07/05/2014    Social History  Substance Use Topics  . Smoking status: Never Smoker   . Smokeless tobacco: Never Used  . Alcohol Use:  No    Family History  Problem Relation Age of Onset  . Cancer Paternal Grandfather     prostate  . Hyperlipidemia Paternal Grandfather   . Stroke Paternal Grandfather    Allergies  Allergen Reactions  . Penicillins Hives and Swelling  . Sulfa Antibiotics Hives    OBJECTIVE: Filed Vitals:   09/29/14 0600 09/29/14 1010 09/29/14 1030 09/29/14 1230  BP:  150/74 142/69 136/74  Pulse: 86 88 88 88  Temp: 97.8 F (36.6 C) 99.1 F (37.3 C)    TempSrc:  Oral    Resp: 19 18 18 18   Height:      Weight: 139 lb 9.6 oz (63.322 kg) 137 lb 2 oz (62.2 kg)    SpO2:       Body mass index is 24.3 kg/(m^2).  General: she is comfortable on hemodialysis Lungs: clear Cor: regular S1 and S2 with no murmurs Right leg: Not as swollen as when I last saw her last week but still tender to palpation  Lab Results Lab Results  Component Value Date   WBC 10.3 09/29/2014   HGB  9.1* 09/29/2014   HCT 27.9* 09/29/2014   MCV 88.6 09/29/2014   PLT 250 09/29/2014    Lab Results  Component Value Date   CREATININE 5.29* 09/29/2014   BUN 23* 09/29/2014   NA 138 09/29/2014   K 3.5 09/29/2014   CL 99* 09/29/2014   CO2 26 09/29/2014    Lab Results  Component Value Date   ALT <5* 09/28/2014   AST 18 09/28/2014   ALKPHOS 78 09/28/2014   BILITOT 0.2* 09/28/2014     Microbiology: Recent Results (from the past 240 hour(s))  Surgical pcr screen     Status: None   Collection Time: 09/28/14  1:31 AM  Result Value Ref Range Status   MRSA, PCR NEGATIVE NEGATIVE Final   Staphylococcus aureus NEGATIVE NEGATIVE Final    Comment:        The Xpert SA Assay (FDA approved for NASAL specimens in patients over 96 years of age), is one component of a comprehensive surveillance program.  Test performance has been validated by [facility] for patients greater than or equal to 89 year old. It is not intended to diagnose infection nor to guide or monitor treatment.   Culture, routine-abscess     Status:  None (Preliminary result)   Collection Time: 09/28/14  5:30 PM  Result Value Ref Range Status   Specimen Description ABSCESS RIGHT KNEE  Final   Special Requests NO2  Final   Gram Stain   Final    RARE WBC PRESENT, PREDOMINANTLY PMN NO SQUAMOUS EPITHELIAL CELLS SEEN NO ORGANISMS SEEN Performed at [facility]    Culture PENDING  Incomplete   Report Status PENDING  Incomplete  Culture, routine-abscess     Status: None (Preliminary result)   Collection Time: 09/28/14  5:30 PM  Result Value Ref Range Status   Specimen Description ABSCESS LEFT THIGH  Final   Special Requests NO1  Final   Gram Stain   Final    RARE WBC PRESENT, PREDOMINANTLY PMN NO SQUAMOUS EPITHELIAL CELLS SEEN NO ORGANISMS SEEN Performed at [facility]    Culture PENDING  Incomplete   Report Status PENDING  Incomplete   Turbid fluid was aspirated from her thigh. Gram stain is negative and cultures are pending. I will continue Ancef for now. She is scheduled to go back to the OR for further debridement.  Michel Bickers, MD [facility] [place] [facility] [phone] pager   [phone] cell 09/29/2014, 1:58 PM

## 2014-09-29 NOTE — Progress Notes (Signed)
     Subjective:  Patient reports pain as moderate.  Resting comfortably in bed.  Family is at the bedside.  Aspiration of the R thigh revealed no significant fluid at the medial thigh in the area of the previous surgical incision.  Aspiration of the lateral thigh was able to collect 10-15cc of turbid fluid.  There was possible concern for communication with the joint space. Preliminary results of culture show no growth.    Objective:   VITALS:   Filed Vitals:   09/28/14 2016 09/29/14 0000 09/29/14 0500 09/29/14 0600  BP: 131/63 131/65 128/68   Pulse: 85 84 96 86  Temp: 98.9 F (37.2 C) 98.8 F (37.1 C) 97.8 F (36.6 C) 97.8 F (36.6 C)  TempSrc:  Oral    Resp: 18 18 19 19   Height:      Weight:    63.322 kg (139 lb 9.6 oz)  SpO2: 98% 100%      Neurologically intact ABD soft Neurovascular intact Sensation intact distally Intact pulses distally Dorsiflexion/Plantar flexion intact  R distal thigh pain on the lateral and medial side is worse today with palpation than yesterday.  Very limited ROM of the knee due to the pain.  Proximal aspect to the R thigh remain pain free.  No erythema or warmth to the right thigh.   Sensation is intact with 2+ distal pulses.   Lab Results  Component Value Date   WBC 5.7 09/28/2014   HGB 9.1* 09/28/2014   HCT 28.7* 09/28/2014   MCV 89.1 09/28/2014   PLT 222 09/28/2014   BMET    Component Value Date/Time   NA 139 09/28/2014 0550   NA 133* 01/18/2013 1106   K 3.3* 09/28/2014 0550   CL 102 09/28/2014 0550   CO2 28 09/28/2014 0550   GLUCOSE 103* 09/28/2014 0550   GLUCOSE 929* 01/18/2013 1106   BUN 17 09/28/2014 0550   BUN 37* 01/18/2013 1106   CREATININE 3.94* 09/28/2014 0550   CALCIUM 9.1 09/28/2014 0550   GFRNONAA 14* 09/28/2014 0550   GFRAA 17* 09/28/2014 0550     Assessment/Plan: 1 Day Post-Op   Principal Problem:   Myositis Active Problems:   Hypothyroidism   DM I (diabetes mellitus, type I), uncontrolled   Essential  hypertension   Chronic anemia   Lower extremity pain, right   Abscess   Up with therapy WBAT in the RLE Preliminary results of culture show no growth.  Patient reports significant increase in pain today in comparison to yesterday.  Dr. Mardelle Matte plans to see the patient today.  Tentative plan is to take to the OR tomorrow for repeat wash out of the R thigh.    Teal Bontrager [name] 09/29/2014, 9:29 AM Cell [phone]

## 2014-09-29 NOTE — Progress Notes (Signed)
ANTIBIOTIC CONSULT NOTE - FOLLOW UP  Pharmacy Consult for Cefazolin Indication: RLE abscess/cellulitis  Allergies  Allergen Reactions  . Penicillins Hives and Swelling  . Sulfa Antibiotics Hives    Patient Measurements: Height: 5\' 3"  (160 cm) Weight: 137 lb 2 oz (62.2 kg) IBW/kg (Calculated) : 52.4  Vital Signs: Temp: 99.1 F (37.3 C) (08/18 1010) Temp Source: Oral (08/18 1010) BP: 136/74 mmHg (08/18 1230) Pulse Rate: 88 (08/18 1230) Intake/Output from previous day: 08/17 0701 - 08/18 0700 In: 290 [P.O.:240; IV Piggyback:50] Out: 550 [Urine:550] Intake/Output from this shift: Total I/O In: 210 [P.O.:210] Out: 250 [Urine:250]  Labs:  Recent Labs  09/27/14 2025 09/28/14 0550 09/29/14 1057  WBC 8.7 5.7 10.3  HGB 10.3* 9.1* 9.1*  PLT 299 222 250  CREATININE 3.31* 3.94* 5.29*   Estimated Creatinine Clearance: 12.9 mL/min (by C-G formula based on Cr of 5.29). No results for input(s): VANCOTROUGH, VANCOPEAK, VANCORANDOM, GENTTROUGH, GENTPEAK, GENTRANDOM, TOBRATROUGH, TOBRAPEAK, TOBRARND, AMIKACINPEAK, AMIKACINTROU, AMIKACIN in the last 72 hours.   Microbiology: Recent Results (from the past 720 hour(s))  Surgical pcr screen     Status: None   Collection Time: 09/01/14  5:59 AM  Result Value Ref Range Status   MRSA, PCR NEGATIVE NEGATIVE Final   Staphylococcus aureus NEGATIVE NEGATIVE Final    Comment:        The Xpert SA Assay (FDA approved for NASAL specimens in patients over 66 years of age), is one component of a comprehensive surveillance program.  Test performance has been validated by [facility] for patients greater than or equal to 40 year old. It is not intended to diagnose infection nor to guide or monitor treatment.   Surgical pcr screen     Status: None   Collection Time: 09/28/14  1:31 AM  Result Value Ref Range Status   MRSA, PCR NEGATIVE NEGATIVE Final   Staphylococcus aureus NEGATIVE NEGATIVE Final    Comment:        The Xpert SA Assay  (FDA approved for NASAL specimens in patients over 56 years of age), is one component of a comprehensive surveillance program.  Test performance has been validated by [facility] for patients greater than or equal to 56 year old. It is not intended to diagnose infection nor to guide or monitor treatment.   Culture, routine-abscess     Status: None (Preliminary result)   Collection Time: 09/28/14  5:30 PM  Result Value Ref Range Status   Specimen Description ABSCESS RIGHT KNEE  Final   Special Requests NO2  Final   Gram Stain   Final    RARE WBC PRESENT, PREDOMINANTLY PMN NO SQUAMOUS EPITHELIAL CELLS SEEN NO ORGANISMS SEEN Performed at [facility]    Culture PENDING  Incomplete   Report Status PENDING  Incomplete  Culture, routine-abscess     Status: None (Preliminary result)   Collection Time: 09/28/14  5:30 PM  Result Value Ref Range Status   Specimen Description ABSCESS LEFT THIGH  Final   Special Requests NO1  Final   Gram Stain   Final    RARE WBC PRESENT, PREDOMINANTLY PMN NO SQUAMOUS EPITHELIAL CELLS SEEN NO ORGANISMS SEEN Performed at [facility]    Culture PENDING  Incomplete   Report Status PENDING  Incomplete    Anti-infectives    Start     Dose/Rate Route Frequency Ordered Stop   09/28/14 1300  ceFAZolin (ANCEF) IVPB 2 g/50 mL premix     2 g 100 mL/hr over  30 Minutes Intravenous Every 12 hours 09/28/14 0035        Assessment: 29 YOF who continues on Ancef for RLE abscess/cellulitis concerning for MSSA. The patient is noted to be ESRD receiving HD on T/Th/Sat - will adjust antibiotics accordingly. Since the patient has received ~4 grams in the past 24 hours - will only give 1g post HD today and then will resume the usual 2g/HD starting on 8/20  Goal of Therapy:  Proper antibiotics for infection/cultures adjusted for renal/hepatic function   Plan:  1. Cefazolin 1g post HD today 2. Start Cefazolin 2g post HD-T/Th/Sat starting on  8/20 3. Will continue to follow HD schedule/duration, culture results, LOT, and antibiotic de-escalation plans   Alycia Rossetti, PharmD, BCPS Clinical Pharmacist Pager: [phone] 09/29/2014 1:17 PM

## 2014-09-29 NOTE — Procedures (Signed)
Patient seen on Hemodialysis. QB 400, UF goal 2L Treatment adjusted as needed.  Elmarie Shiley MD [facility]. Office # [phone] Pager # [phone] 11:00 AM

## 2014-09-29 NOTE — Progress Notes (Addendum)
[name] PROGRESS NOTE  Tracy [name] [id number] DOB: 23-May-1985 DOA: 09/27/2014 PCP: [name]  Assessment/Plan: 1. Right lower extremity pain with possible recurrence of myositis with abscess and phlegmon; Orthopedics consulted and underwent ultrasound guided aspiration of the abscess and sent for cultures. Meanwhile patient was restarted on cefazolin and Dr Megan Salon assisting [place] with antibiotics.  Pain control and further debridement by orthopedics in am.   2. ESRD on hemodialysis - Tuesday Thursday and Saturday. Patient was recently started on dialysis last month. Consulted nephrology for dialysis.  3. Type 1 diabetes Mellitus: CBG (last 3)   Recent Labs  09/29/14 0424 09/29/14 0639 09/29/14 1621  GLUCAP 57* 86 116*    Resume SSI. As she was hypoglycemic this am, will cut the dose of lantus to 6 units .    Anemia: Anemia of chronic disease.   Hypertension: Controlled.    Hypothyroidism: Resume synthroid.    Constipation: Senna, colace and miralax ordered.    Code Status: full code.  Family Communication: none at bedside Disposition Plan: pending.    Consultants:  Radiology  ID  Renal.  Procedures: US guided aspiration of 2 sites of pain/inflammation of RLE.  1 = medial thigh, where there is post-op chages/scar. 2 = lateral knee. Fluid collection  Antibiotics:  cefazolin  HPI/Subjective: Pain not well controlled. And constipated.   Objective: Filed Vitals:   09/29/14 1230  BP: 136/74  Pulse: 88  Temp:   Resp: 18    Intake/Output Summary (Last 24 hours) at 09/29/14 1826 Last data filed at 09/29/14 0856  Gross per 24 hour  Intake    500 ml  Output    800 ml  Net   -300 ml   Filed Weights   09/28/14 0508 09/29/14 0600 09/29/14 1010  Weight: 63.8 kg (140 lb 10.5 oz) 63.322 kg (139 lb 9.6 oz) 62.2 kg (137 lb 2 oz)    Exam:   General:  Alert in mild distress from pain .  Cardiovascular:  s1s2  Respiratory: ctab  Abdomen: soft NT ND BS+  Musculoskeletal: RIGHT thigh swelling and tenderness.   Data Reviewed: Basic Metabolic Panel:  Recent Labs Lab 09/27/14 2025 09/28/14 0550 09/29/14 1057  NA 137 139 138  K 3.5 3.3* 3.5  CL 97* 102 99*  CO2 27 28 26   GLUCOSE 212* 103* 88  BUN 13 17 23*  CREATININE 3.31* 3.94* 5.29*  CALCIUM 9.5 9.1 9.2  PHOS  --   --  4.2   Liver Function Tests:  Recent Labs Lab 09/27/14 2025 09/28/14 0550 09/29/14 1057  AST 24 18  --   ALT 6* <5*  --   ALKPHOS 97 78  --   BILITOT 0.5 0.2*  --   PROT 8.1 6.6  --   ALBUMIN 3.4* 2.7* 2.6*   No results for input(s): LIPASE, AMYLASE in the last 168 hours. No results for input(s): AMMONIA in the last 168 hours. CBC:  Recent Labs Lab 09/27/14 2025 09/28/14 0550 09/29/14 1057  WBC 8.7 5.7 10.3  NEUTROABS 6.9 3.5 8.5*  HGB 10.3* 9.1* 9.1*  HCT 31.8* 28.7* 27.9*  MCV 88.6 89.1 88.6  PLT 299 222 250   Cardiac Enzymes: No results for input(s): CKTOTAL, CKMB, CKMBINDEX, TROPONINI in the last 168 hours. BNP (last 3 results) No results for input(s): BNP in the last 8760 hours.  ProBNP (last 3 results) No results for input(s): PROBNP in the last 8760 hours.  CBG:  Recent Labs Lab 09/29/14 0009  09/29/14 0103 09/29/14 0424 09/29/14 0639 09/29/14 1621  GLUCAP 68 75 57* 86 116*    Recent Results (from the past 240 hour(s))  Surgical pcr screen     Status: None   Collection Time: 09/28/14  1:31 AM  Result Value Ref Range Status   MRSA, PCR NEGATIVE NEGATIVE Final   Staphylococcus aureus NEGATIVE NEGATIVE Final    Comment:        The Xpert SA Assay (FDA approved for NASAL specimens in patients over 30 years of age), is one component of a comprehensive surveillance program.  Test performance has been validated by [facility] for patients greater than or equal to 63 year old. It is not intended to diagnose infection nor to guide or monitor treatment.   Culture,  routine-abscess     Status: None (Preliminary result)   Collection Time: 09/28/14  5:30 PM  Result Value Ref Range Status   Specimen Description ABSCESS RIGHT KNEE  Final   Special Requests NO2  Final   Gram Stain   Final    RARE WBC PRESENT, PREDOMINANTLY PMN NO SQUAMOUS EPITHELIAL CELLS SEEN NO ORGANISMS SEEN Performed at [facility]    Culture PENDING  Incomplete   Report Status PENDING  Incomplete  Culture, routine-abscess     Status: None (Preliminary result)   Collection Time: 09/28/14  5:30 PM  Result Value Ref Range Status   Specimen Description ABSCESS LEFT THIGH  Final   Special Requests NO1  Final   Gram Stain   Final    RARE WBC PRESENT, PREDOMINANTLY PMN NO SQUAMOUS EPITHELIAL CELLS SEEN NO ORGANISMS SEEN Performed at [facility]    Culture PENDING  Incomplete   Report Status PENDING  Incomplete     Studies: [place] Extrem Low Right Ltd  09/28/2014   CLINICAL DATA:  29 year old female with a history of myositis. She has inflammatory changes at a site of prior surgical incision, as well as knee tenderness and inflammation.  EXAM: ULTRASOUND right LOWER EXTREMITY LIMITED  TECHNIQUE: Ultrasound examination of the lower extremity soft tissues was performed in the area of clinical concern.  COMPARISON:  MRI 08/12/2014  FINDINGS: Targeted ultrasound in 2 separate regions of clinical concern along the medial thigh and at the lateral knee. Grayscale and color imaging performed.  There is a heterogeneously echoic, hypoechoic region in the medial thigh a near at a region of prior surgery. Largest collection at this site measures 3.4 cm x 1.0 cm, just subjacent to the skin surface.  There is a anechoic fluid collection with through transmission at the lateral knee without internal flow measuring 4.4 cm  IMPRESSION: Focal fluid collection at the lateral right knee, may represent hematoma, seroma, or abscess. In addition there is a focal region at the medial thigh and,  underlying prior surgical incision with more complex features, potentially representing old hematoma, seroma, or abscess. Image guided aspiration appears possible.  Signed,  Dulcy Fanny. Earleen Newport, DO  [facility]  [facility]   Electronically Signed   By: Corrie Mckusick D.O.   On: 09/28/2014 15:47   US Aspiration  09/28/2014   CLINICAL DATA:  29 year old female with a history of myositis.  The patient has had a recent right-sided incision and drainage of the medial thigh, and now presents with tenderness at this site as well as inflammation/tenderness at the right knee.  Prior MRI demonstrates myositis of the thigh musculature, as well as fluid collection at the knee.  She has been referred for image guided sampling.  EXAM: ULTRASOUND GUIDED ASPIRATION BIOPSY OF MEDIAL RIGHT THIGH FLUID COLLECTION/PHLEGMON, AND LATERAL RIGHT KNEE FLUID COLLECTION  MEDICATIONS: No sedation  PROCEDURE: The procedure, risks, benefits, and alternatives were explained to the patient. Questions regarding the procedure were encouraged and answered. The patient understands and consents to the procedure.  Ultrasound survey was performed with images stored and sent to PACs.  Medial thigh:  The medial thigh was prepped with Betadine in a sterile fashion, and a sterile drape was applied covering the operative field. A sterile gown and sterile gloves were used for the procedure. Local anesthesia was provided with 1% Lidocaine.  Once the patient is prepped and draped sterilely, the skin and subcutaneous tissues were generously infiltrated with 1% lidocaine. Using ultrasound guidance, a Yueh needle was advanced into the region of the abnormal soft tissue/collection with the lowest echogenicity. Aspiration was attempted, with only 1-2 cc of thin yellow fluid aspirated. The sample was sent for analysis.  The Yueh catheter was removed and a sterile dressing was placed.  Lateral knee:  The lateral knee was  prepped with chlorhexidine in a sterile fashion, and a sterile drape was applied covering the operative field. A sterile gown and sterile gloves were used for the procedure. Local anesthesia was provided with 1% Lidocaine.  Once the patient was prepped and draped sterilely, the skin and subcutaneous tissues were generously infiltrated with 1% lidocaine. Using ultrasound guidance, a Yueh needle was advanced into the anechoic fluid collection of the lateral knee. It is difficult to determine whether this fluid collection was connected with the joint space under ultrasound.  Approximately 10 cc-15 cc of turbid yellow fluid was aspirated. Sample was sent to the lab for analysis.  The Yueh catheter was removed and a sterile dressing was placed.  Patient tolerated the procedure well and remained hemodynamically stable throughout.  No complications were encountered and no significant blood loss was encountered.  COMPLICATIONS: None.  FINDINGS: Ultrasound survey of the medial right thigh demonstrates complex heterogeneous collection, which was difficult aspirate. Only 1-2 cc of fluid was aspirated. This is felt to represent phlegmonous change or postoperative changes. No significant fluid collection was present.  Ultrasound survey of the lateral right knee demonstrates anechoic fluid collection. It is difficult to determine whether this connects with the joint space, though this is possible.  Approximately 10-15 cc of turbid yellow fluid was aspirated.  IMPRESSION: Status post ultrasound-guided sampling of medial right thigh and lateral right knee fluid collections. Sample was sent to the lab for analysis from each site.  No significant low viscosity fluid was present at the right medial thigh, with the ultrasound appearance felt to represent organized hematoma and/or phlegmon.  Signed,  Dulcy Fanny. Earleen Newport, DO  [facility]  [facility]   Electronically Signed   By: Corrie Mckusick  D.O.   On: 09/28/2014 18:01    Scheduled Meds: . amLODipine  10 mg Oral Daily  . [START ON 10/01/2014]  ceFAZolin (ANCEF) IV  2 g Intravenous Q T,Th,Sat-1800  . darbepoetin (ARANESP) injection - DIALYSIS  100 mcg Intravenous Q Thu-HD  . ferric gluconate (FERRLECIT/NULECIT) IV  125 mg Intravenous Q Thu-HD  . insulin aspart  0-9 Units Subcutaneous Q4H  . insulin glargine  12 Units Subcutaneous QHS  . levothyroxine  75 mcg Oral QAC breakfast  . multivitamin  1 tablet Oral QHS  . OxyCODONE  20 mg Oral Q12H  . polyethylene glycol  17 g Oral Daily  . senna-docusate  2 tablet Oral BID  . sevelamer carbonate  800 mg Oral TID WC  . sodium chloride  500 mL Intravenous Once  . sodium chloride  3 mL Intravenous Q12H   Continuous Infusions:   Principal Problem:   Myositis Active Problems:   Hypothyroidism   DM I (diabetes mellitus, type I), uncontrolled   Essential hypertension   Chronic anemia   Lower extremity pain, right   Abscess    Time spent: 25 minutes    [place] [facility] Pager [phone]. If 7PM-7AM, please contact night-coverage at [email or web address], password [facility] 09/29/2014, 6:26 PM  LOS: 2 days

## 2014-09-29 NOTE — Progress Notes (Signed)
Inpatient Diabetes Program Recommendations  AACE/ADA: New Consensus Statement on Inpatient Glycemic Control (2013)  Target Ranges:  Prepandial:   less than 140 mg/dL      Peak postprandial:   less than 180 mg/dL (1-2 hours)      Critically ill patients:  140 - 180 mg/dL   Review of Glycemic Control:   Results for REESA, GIRDLEY (MRN [id number]) as of 09/29/2014 13:28  Ref. Range 09/28/2014 20:06 09/29/2014 00:09 09/29/2014 01:03 09/29/2014 04:24 09/29/2014 06:39  Glucose-Capillary Latest Ref Range: 65-99 mg/dL 95 68 75 57 (L) 86   Diabetes history:  Type 1 diabetes Outpatient Diabetes medications: Novolog 5 units tid with meals, Lantus 18 units q HS Current orders for Inpatient glycemic control:  Lantus 12 units q HS, Novolog sensitive q 4 hours  CBG's less than goal.  Please consider reducing Lantus to 8 units q HS.    Thanks, Adah Perl, RN, BC-ADM Inpatient Diabetes Coordinator Pager [phone] (8a-5p)

## 2014-09-29 NOTE — Progress Notes (Signed)
Subjective:  For hd today,cos R lower extrem pain   Objective Vital signs in last 24 hours: Filed Vitals:   09/28/14 2016 09/29/14 0000 09/29/14 0500 09/29/14 0600  BP: 131/63 131/65 128/68   Pulse: 85 84 96 86  Temp: 98.9 F (37.2 C) 98.8 F (37.1 C) 97.8 F (36.6 C) 97.8 F (36.6 C)  TempSrc:  Oral    Resp: 18 18 19 19   Height:      Weight:    63.322 kg (139 lb 9.6 oz)  SpO2: 98% 100%     Weight change: 1.179 kg (2 lb 9.6 oz)  Physical Exam: General: alert, co left lower etrem . Discomfort but not in Distress   Heart: RRR, no mur , rub or gallop Lungs: CTA bilat , non labored  Abdomen: BS pos. Soft NT, ND Extremities: trace R pedal edema with some swelling of R patella  Dialysis Access: pos bruit L UA AVF/ R IJ perm cath  Dialysis Orders: [facility] . EDW 62.5 and leaving below HD Bath 2k,2ca Time 4hrs Heparin 7600. Access R ij p.cath/ LUA AVF  mircera 50 mcg q2wks hd    Problem/Plan:  1. Right Knee and Thigh pain with possible recurrence of myositis with abscess and phlegmon- Per admit team with Ortho(dr. Percell Miller seeing / ID Cr. Megan Salon seeing placed  On Ancef/ IR Yest Aspiration Cultures pending  2. ESRD - HD on normal schedule TTS , k and volume stable  3. Hypertension/volume - bp 128/68  this am pre hd / lowering edw as op /some related to decr appetite and new ESRD pt  4. Anemia - hgb 9.1 on esa and fe weekly hd 5. Metabolic bone disease - Renvela binder and no vit d/ Corect. Ca 10.1 yesterday /co severe Constipation change to [place] (she does not  Want binder to chew or crush med )[facility]  6. Nutrition - renal/carb mod diet and renal vit .alb 2.7 7. IDDM Type 1 - per admt Constipation - Prob . Sec to pain meds /immobility/ ????renvela binder   Ernest Haber, PA-C [facility] Beeper [phone] 09/29/2014,9:06 AM  LOS: 2 days   Labs: Basic Metabolic Panel:  Recent Labs Lab 09/27/14 2025  09/28/14 0550  NA 137 139  K 3.5 3.3*  CL 97* 102  CO2 27 28  GLUCOSE 212* 103*  BUN 13 17  CREATININE 3.31* 3.94*  CALCIUM 9.5 9.1   Liver Function Tests:  Recent Labs Lab 09/27/14 2025 09/28/14 0550  AST 24 18  ALT 6* <5*  ALKPHOS 97 78  BILITOT 0.5 0.2*  PROT 8.1 6.6  ALBUMIN 3.4* 2.7*   CBC:  Recent Labs Lab 09/27/14 2025 09/28/14 0550  WBC 8.7 5.7  NEUTROABS 6.9 3.5  HGB 10.3* 9.1*  HCT 31.8* 28.7*  MCV 88.6 89.1  PLT 299 222   Cardiac Enzymes: No results for input(s): CKTOTAL, CKMB, CKMBINDEX, TROPONINI in the last 168 hours. CBG:  Recent Labs Lab 09/28/14 2006 09/29/14 0009 09/29/14 0103 09/29/14 0424 09/29/14 0639  GLUCAP 95 68 75 57* 86    Studies/Results: [place] Extrem Low Right Ltd  09/28/2014   CLINICAL DATA:  29 year old female with a history of myositis. She has inflammatory changes at a site of prior surgical incision, as well as knee tenderness and inflammation.  EXAM: ULTRASOUND right LOWER EXTREMITY LIMITED  TECHNIQUE: Ultrasound examination of the lower extremity soft tissues was performed in the area of clinical concern.  COMPARISON:  MRI 08/12/2014  FINDINGS: Targeted ultrasound in 2 separate regions of clinical concern along the medial thigh and at the lateral knee. Grayscale and color imaging performed.  There is a heterogeneously echoic, hypoechoic region in the medial thigh a near at a region of prior surgery. Largest collection at this site measures 3.4 cm x 1.0 cm, just subjacent to the skin surface.  There is a anechoic fluid collection with through transmission at the lateral knee without internal flow measuring 4.4 cm  IMPRESSION: Focal fluid collection at the lateral right knee, may represent hematoma, seroma, or abscess. In addition there is a focal region at the medial thigh and, underlying prior surgical incision with more complex features, potentially representing old hematoma, seroma, or abscess. Image guided aspiration appears  possible.  Signed,  Dulcy Fanny. Earleen Newport, DO  [facility]  [facility]   Electronically Signed   By: Corrie Mckusick D.O.   On: 09/28/2014 15:47   US Aspiration  09/28/2014   CLINICAL DATA:  29 year old female with a history of myositis.  The patient has had a recent right-sided incision and drainage of the medial thigh, and now presents with tenderness at this site as well as inflammation/tenderness at the right knee.  Prior MRI demonstrates myositis of the thigh musculature, as well as fluid collection at the knee.  She has been referred for image guided sampling.  EXAM: ULTRASOUND GUIDED ASPIRATION BIOPSY OF MEDIAL RIGHT THIGH FLUID COLLECTION/PHLEGMON, AND LATERAL RIGHT KNEE FLUID COLLECTION  MEDICATIONS: No sedation  PROCEDURE: The procedure, risks, benefits, and alternatives were explained to the patient. Questions regarding the procedure were encouraged and answered. The patient understands and consents to the procedure.  Ultrasound survey was performed with images stored and sent to PACs.  Medial thigh:  The medial thigh was prepped with Betadine in a sterile fashion, and a sterile drape was applied covering the operative field. A sterile gown and sterile gloves were used for the procedure. Local anesthesia was provided with 1% Lidocaine.  Once the patient is prepped and draped sterilely, the skin and subcutaneous tissues were generously infiltrated with 1% lidocaine. Using ultrasound guidance, a Yueh needle was advanced into the region of the abnormal soft tissue/collection with the lowest echogenicity. Aspiration was attempted, with only 1-2 cc of thin yellow fluid aspirated. The sample was sent for analysis.  The Yueh catheter was removed and a sterile dressing was placed.  Lateral knee:  The lateral knee was prepped with chlorhexidine in a sterile fashion, and a sterile drape was applied covering the operative field. A sterile gown and sterile gloves were used  for the procedure. Local anesthesia was provided with 1% Lidocaine.  Once the patient was prepped and draped sterilely, the skin and subcutaneous tissues were generously infiltrated with 1% lidocaine. Using ultrasound guidance, a Yueh needle was advanced into the anechoic fluid collection of the lateral knee. It is difficult to determine whether this fluid collection was connected with the joint space under ultrasound.  Approximately 10 cc-15 cc of turbid yellow fluid was aspirated. Sample was sent to the lab for analysis.  The Yueh catheter was removed and a sterile dressing was placed.  Patient tolerated the procedure well and remained hemodynamically stable throughout.  No complications were encountered and no significant blood loss was encountered.  COMPLICATIONS: None.  FINDINGS: Ultrasound survey of the medial right thigh demonstrates complex heterogeneous collection, which was difficult aspirate. Only 1-2 cc of fluid was aspirated. This is felt to represent  phlegmonous change or postoperative changes. No significant fluid collection was present.  Ultrasound survey of the lateral right knee demonstrates anechoic fluid collection. It is difficult to determine whether this connects with the joint space, though this is possible.  Approximately 10-15 cc of turbid yellow fluid was aspirated.  IMPRESSION: Status post ultrasound-guided sampling of medial right thigh and lateral right knee fluid collections. Sample was sent to the lab for analysis from each site.  No significant low viscosity fluid was present at the right medial thigh, with the ultrasound appearance felt to represent organized hematoma and/or phlegmon.  Signed,  Dulcy Fanny. Earleen Newport, DO  [facility]  [facility]   Electronically Signed   By: Corrie Mckusick D.O.   On: 09/28/2014 18:01   Medications:   . amLODipine  10 mg Oral Daily  .  ceFAZolin (ANCEF) IV  2 g Intravenous Q12H  . darbepoetin (ARANESP)  injection - DIALYSIS  100 mcg Intravenous Q Thu-HD  . ferric gluconate (FERRLECIT/NULECIT) IV  125 mg Intravenous Q Thu-HD  . insulin aspart  0-9 Units Subcutaneous Q4H  . insulin glargine  12 Units Subcutaneous QHS  . levothyroxine  75 mcg Oral QAC breakfast  . multivitamin  1 tablet Oral QHS  . OxyCODONE  20 mg Oral Q12H  . polyethylene glycol  17 g Oral Daily  . sevelamer carbonate  800 mg Oral TID WC  . sodium chloride  500 mL Intravenous Once  . sodium chloride  3 mL Intravenous Q12H

## 2014-09-30 ENCOUNTER — Encounter (HOSPITAL_COMMUNITY): Payer: Self-pay | Admitting: General Practice

## 2014-09-30 ENCOUNTER — Encounter (HOSPITAL_COMMUNITY)
Admission: EM | Disposition: A | Discharge status: Home / Self Care | Payer: Self-pay | Source: Home / Self Care | Attending: Internal Medicine

## 2014-09-30 ENCOUNTER — Encounter: Payer: BLUE CROSS/BLUE SHIELD | Admitting: Vascular Surgery

## 2014-09-30 ENCOUNTER — Inpatient Hospital Stay (HOSPITAL_COMMUNITY): Payer: BLUE CROSS/BLUE SHIELD | Admitting: Certified Registered Nurse Anesthetist

## 2014-09-30 DIAGNOSIS — Z9889 Other specified postprocedural states: Secondary | ICD-10-CM

## 2014-09-30 HISTORY — PX: I&D EXTREMITY: SHX5045

## 2014-09-30 LAB — SYNOVIAL CELL COUNT + DIFF, W/ CRYSTALS
Crystals, Fluid: NONE SEEN
EOSINOPHILS-SYNOVIAL: 0 % (ref 0–1)
LYMPHOCYTES-SYNOVIAL FLD: 18 % (ref 0–20)
MONOCYTE-MACROPHAGE-SYNOVIAL FLUID: 6 % — AB (ref 50–90)
Neutrophil, Synovial: 76 % — ABNORMAL HIGH (ref 0–25)
WBC, Synovial: 16 /mm3 (ref 0–200)

## 2014-09-30 LAB — GLUCOSE, CAPILLARY
GLUCOSE-CAPILLARY: 117 mg/dL — AB (ref 65–99)
GLUCOSE-CAPILLARY: 125 mg/dL — AB (ref 65–99)
GLUCOSE-CAPILLARY: 143 mg/dL — AB (ref 65–99)
GLUCOSE-CAPILLARY: 108 mg/dL — AB (ref 65–99)
Glucose-Capillary: 84 mg/dL (ref 65–99)
Glucose-Capillary: 184 mg/dL — ABNORMAL HIGH (ref 65–99)

## 2014-09-30 LAB — BASIC METABOLIC PANEL
Anion gap: 11 (ref 5–15)
BUN: 10 mg/dL (ref 6–20)
CHLORIDE: 97 mmol/L — AB (ref 101–111)
CO2: 26 mmol/L (ref 22–32)
Calcium: 9.2 mg/dL (ref 8.9–10.3)
Creatinine, Ser: 3.2 mg/dL — ABNORMAL HIGH (ref 0.44–1.00)
GFR calc Af Amer: 21 mL/min — ABNORMAL LOW (ref >60)
GFR calc non Af Amer: 18 mL/min — ABNORMAL LOW (ref >60)
GLUCOSE: 118 mg/dL — AB (ref 65–99)
POTASSIUM: 3.9 mmol/L (ref 3.5–5.1)
Sodium: 134 mmol/L — ABNORMAL LOW (ref 135–145)

## 2014-09-30 LAB — GRAM STAIN

## 2014-09-30 SURGERY — IRRIGATION AND DEBRIDEMENT EXTREMITY
Anesthesia: General | Site: Thigh | Laterality: Right

## 2014-09-30 MED ORDER — PROPOFOL 10 MG/ML IV BOLUS
INTRAVENOUS | Status: DC | PRN
  Administered 2014-09-30: 150 mg via INTRAVENOUS

## 2014-09-30 MED ORDER — METHOCARBAMOL 500 MG PO TABS
500.0000 mg | ORAL_TABLET | Freq: Four times a day (QID) | ORAL | Status: DC | PRN
  Administered 2014-10-02 – 2014-10-05 (×6): 500 mg via ORAL
  Filled 2014-09-30 (×6): qty 1

## 2014-09-30 MED ORDER — FENTANYL CITRATE (PF) 100 MCG/2ML IJ SOLN
INTRAMUSCULAR | Status: DC | PRN
  Administered 2014-09-30: 50 ug via INTRAVENOUS
  Administered 2014-09-30: 25 ug via INTRAVENOUS
  Administered 2014-09-30: 100 ug via INTRAVENOUS
  Administered 2014-09-30 (×2): 25 ug via INTRAVENOUS
  Administered 2014-09-30 (×2): 50 ug via INTRAVENOUS
  Administered 2014-09-30: 25 ug via INTRAVENOUS
  Administered 2014-09-30: 50 ug via INTRAVENOUS
  Administered 2014-09-30: 100 ug via INTRAVENOUS

## 2014-09-30 MED ORDER — ENOXAPARIN SODIUM 30 MG/0.3ML ~~LOC~~ SOLN
30.0000 mg | SUBCUTANEOUS | Status: DC
  Administered 2014-10-01 – 2014-10-05 (×4): 30 mg via SUBCUTANEOUS
  Filled 2014-09-30 (×4): qty 0.3

## 2014-09-30 MED ORDER — MORPHINE SULFATE (PF) 4 MG/ML IV SOLN
INTRAVENOUS | Status: AC
  Administered 2014-09-30: 2 mg
  Filled 2014-09-30: qty 1

## 2014-09-30 MED ORDER — DIAZEPAM 5 MG/ML IJ SOLN
5.0000 mg | Freq: Four times a day (QID) | INTRAMUSCULAR | Status: DC | PRN
  Administered 2014-09-30 – 2014-10-03 (×8): 5 mg via INTRAVENOUS
  Filled 2014-09-30 (×8): qty 2

## 2014-09-30 MED ORDER — PROMETHAZINE HCL 25 MG/ML IJ SOLN
6.2500 mg | INTRAMUSCULAR | Status: DC | PRN
  Administered 2014-09-30: 12.5 mg via INTRAVENOUS

## 2014-09-30 MED ORDER — MIDAZOLAM HCL 2 MG/2ML IJ SOLN: 0.5000 mg | Freq: Once | INTRAMUSCULAR | Status: DC | PRN

## 2014-09-30 MED ORDER — PROMETHAZINE HCL 25 MG/ML IJ SOLN
INTRAMUSCULAR | Status: AC
  Filled 2014-09-30: qty 1

## 2014-09-30 MED ORDER — BISACODYL 10 MG RE SUPP
10.0000 mg | Freq: Every day | RECTAL | Status: DC | PRN
  Administered 2014-10-02: 10 mg via RECTAL
  Filled 2014-09-30 (×2): qty 1

## 2014-09-30 MED ORDER — METOCLOPRAMIDE HCL 5 MG PO TABS: 5.0000 mg | ORAL_TABLET | Freq: Three times a day (TID) | ORAL | Status: DC | PRN

## 2014-09-30 MED ORDER — DIPHENHYDRAMINE HCL 12.5 MG/5ML PO ELIX: 12.5000 mg | ORAL_SOLUTION | ORAL | Status: DC | PRN

## 2014-09-30 MED ORDER — POLYETHYLENE GLYCOL 3350 17 G PO PACK: 17.0000 g | PACK | Freq: Every day | ORAL | Status: DC | PRN

## 2014-09-30 MED ORDER — MIDAZOLAM HCL 2 MG/2ML IJ SOLN
INTRAMUSCULAR | Status: AC
  Filled 2014-09-30: qty 4

## 2014-09-30 MED ORDER — HYDROMORPHONE HCL 1 MG/ML IJ SOLN: 0.5000 mg | INTRAMUSCULAR | Status: DC | PRN

## 2014-09-30 MED ORDER — SODIUM CHLORIDE 0.9 % IR SOLN
Status: DC | PRN
  Administered 2014-09-30: 6000 mL

## 2014-09-30 MED ORDER — SODIUM CHLORIDE 0.9 % IR SOLN
Status: DC | PRN
  Administered 2014-09-30: 1000 mL

## 2014-09-30 MED ORDER — DIPHENHYDRAMINE HCL 50 MG/ML IJ SOLN
INTRAMUSCULAR | Status: AC
  Filled 2014-09-30: qty 1

## 2014-09-30 MED ORDER — OXYCODONE HCL 5 MG PO TABS
10.0000 mg | ORAL_TABLET | ORAL | Status: DC | PRN
  Administered 2014-09-30 – 2014-10-01 (×4): 20 mg via ORAL
  Filled 2014-09-30 (×3): qty 4
  Filled 2014-09-30: qty 2
  Filled 2014-09-30: qty 4

## 2014-09-30 MED ORDER — SODIUM CHLORIDE 0.9 % IV SOLN: INTRAVENOUS | Status: DC

## 2014-09-30 MED ORDER — HYDROMORPHONE HCL 1 MG/ML IJ SOLN
INTRAMUSCULAR | Status: AC
  Filled 2014-09-30: qty 1

## 2014-09-30 MED ORDER — FENTANYL CITRATE (PF) 250 MCG/5ML IJ SOLN
INTRAMUSCULAR | Status: AC
  Filled 2014-09-30: qty 5

## 2014-09-30 MED ORDER — SODIUM CHLORIDE 0.9 % IV SOLN
INTRAVENOUS | Status: DC | PRN
  Administered 2014-09-30 (×2): via INTRAVENOUS

## 2014-09-30 MED ORDER — METOCLOPRAMIDE HCL 5 MG/ML IJ SOLN: 5.0000 mg | Freq: Three times a day (TID) | INTRAMUSCULAR | Status: DC | PRN

## 2014-09-30 MED ORDER — METHOCARBAMOL 1000 MG/10ML IJ SOLN
500.0000 mg | Freq: Four times a day (QID) | INTRAVENOUS | Status: DC | PRN
  Filled 2014-09-30: qty 5

## 2014-09-30 MED ORDER — AMLODIPINE BESYLATE 10 MG PO TABS
10.0000 mg | ORAL_TABLET | Freq: Every day | ORAL | Status: DC
  Administered 2014-10-01 – 2014-10-04 (×4): 10 mg via ORAL
  Filled 2014-09-30 (×4): qty 1

## 2014-09-30 MED ORDER — MORPHINE SULFATE (PF) 2 MG/ML IV SOLN
2.0000 mg | INTRAVENOUS | Status: DC | PRN
  Administered 2014-09-30 (×2): 2 mg via INTRAVENOUS

## 2014-09-30 MED ORDER — HYDROMORPHONE HCL 1 MG/ML IJ SOLN
0.2500 mg | INTRAMUSCULAR | Status: DC | PRN
  Administered 2014-09-30 (×2): 1 mg via INTRAVENOUS

## 2014-09-30 MED ORDER — MEPERIDINE HCL 25 MG/ML IJ SOLN: 6.2500 mg | INTRAMUSCULAR | Status: DC | PRN

## 2014-09-30 MED ORDER — MIDAZOLAM HCL 5 MG/5ML IJ SOLN
INTRAMUSCULAR | Status: DC | PRN
  Administered 2014-09-30: 2 mg via INTRAVENOUS

## 2014-09-30 MED ORDER — ONDANSETRON HCL 4 MG/2ML IJ SOLN
INTRAMUSCULAR | Status: AC
  Filled 2014-09-30: qty 2

## 2014-09-30 MED ORDER — LIDOCAINE HCL 1 % IJ SOLN
INTRAMUSCULAR | Status: DC | PRN
  Administered 2014-09-30: 40 mg via INTRADERMAL

## 2014-09-30 MED ORDER — DIPHENHYDRAMINE HCL 50 MG/ML IJ SOLN
INTRAMUSCULAR | Status: DC | PRN
  Administered 2014-09-30: 12.5 mg via INTRAVENOUS

## 2014-09-30 MED ORDER — MIDAZOLAM HCL 2 MG/2ML IJ SOLN
INTRAMUSCULAR | Status: AC
  Filled 2014-09-30: qty 2

## 2014-09-30 MED ORDER — MAGNESIUM CITRATE PO SOLN: 1.0000 | Freq: Once | ORAL | Status: DC | PRN

## 2014-09-30 MED ORDER — ARTIFICIAL TEARS OP OINT
TOPICAL_OINTMENT | OPHTHALMIC | Status: AC
  Filled 2014-09-30: qty 3.5

## 2014-09-30 MED ORDER — ROCURONIUM BROMIDE 50 MG/5ML IV SOLN
INTRAVENOUS | Status: AC
  Filled 2014-09-30: qty 1

## 2014-09-30 SURGICAL SUPPLY — 45 items
BANDAGE ELASTIC 4 VELCRO ST LF (GAUZE/BANDAGES/DRESSINGS) ×3 IMPLANT
BANDAGE ELASTIC 6 VELCRO ST LF (GAUZE/BANDAGES/DRESSINGS) ×3 IMPLANT
BLADE SURG 10 STRL SS (BLADE) ×2 IMPLANT
BNDG COHESIVE 4X5 TAN STRL (GAUZE/BANDAGES/DRESSINGS) ×3 IMPLANT
BNDG GAUZE ELAST 4 BULKY (GAUZE/BANDAGES/DRESSINGS) ×3 IMPLANT
BOOTCOVER CLEANROOM LRG (PROTECTIVE WEAR) ×6 IMPLANT
CANISTER WOUND CARE 500ML ATS (WOUND CARE) ×2 IMPLANT
CONT SPEC 4OZ CLIKSEAL STRL BL (MISCELLANEOUS) ×2 IMPLANT
COVER SURGICAL LIGHT HANDLE (MISCELLANEOUS) ×3 IMPLANT
CUFF TOURNIQUET SINGLE 34IN LL (TOURNIQUET CUFF) IMPLANT
DRSG MEPILEX BORDER 4X4 (GAUZE/BANDAGES/DRESSINGS) ×4 IMPLANT
DRSG MEPITEL 3X4 ME34 (GAUZE/BANDAGES/DRESSINGS) ×2 IMPLANT
DRSG VAC ATS SM SENSATRAC (GAUZE/BANDAGES/DRESSINGS) ×2 IMPLANT
DURAPREP 26ML APPLICATOR (WOUND CARE) ×3 IMPLANT
ELECT REM PT RETURN 9FT ADLT (ELECTROSURGICAL)
ELECTRODE REM PT RTRN 9FT ADLT (ELECTROSURGICAL) IMPLANT
EVACUATOR 1/8 PVC DRAIN (DRAIN) IMPLANT
GAUZE SPONGE 4X4 12PLY STRL (GAUZE/BANDAGES/DRESSINGS) ×3 IMPLANT
GAUZE XEROFORM 1X8 LF (GAUZE/BANDAGES/DRESSINGS) ×5 IMPLANT
GLOVE BIOGEL PI ORTHO PRO SZ8 (GLOVE) ×2
GLOVE ORTHO TXT STRL SZ7.5 (GLOVE) ×3 IMPLANT
GLOVE PI ORTHO PRO STRL SZ8 (GLOVE) ×1 IMPLANT
GLOVE SURG ORTHO 8.0 STRL STRW (GLOVE) ×6 IMPLANT
GOWN STRL REUS W/ TWL LRG LVL3 (GOWN DISPOSABLE) IMPLANT
GOWN STRL REUS W/TWL LRG LVL3 (GOWN DISPOSABLE)
HANDPIECE INTERPULSE COAX TIP (DISPOSABLE)
KIT BASIN OR (CUSTOM PROCEDURE TRAY) ×3 IMPLANT
KIT ROOM TURNOVER OR (KITS) ×3 IMPLANT
MANIFOLD NEPTUNE II (INSTRUMENTS) ×3 IMPLANT
NS IRRIG 1000ML POUR BTL (IV SOLUTION) ×3 IMPLANT
PACK ORTHO EXTREMITY (CUSTOM PROCEDURE TRAY) ×3 IMPLANT
PAD ARMBOARD 7.5X6 YLW CONV (MISCELLANEOUS) ×6 IMPLANT
SET HNDPC FAN SPRY TIP SCT (DISPOSABLE) IMPLANT
SET IRRIG Y TYPE TUR BLADDER L (SET/KITS/TRAYS/PACK) ×2 IMPLANT
SPONGE LAP 18X18 X RAY DECT (DISPOSABLE) ×3 IMPLANT
STOCKINETTE IMPERVIOUS 9X36 MD (GAUZE/BANDAGES/DRESSINGS) ×3 IMPLANT
SUT ETHILON 3 0 PS 1 (SUTURE) IMPLANT
TOWEL OR 17X24 6PK STRL BLUE (TOWEL DISPOSABLE) ×3 IMPLANT
TOWEL OR 17X26 10 PK STRL BLUE (TOWEL DISPOSABLE) ×3 IMPLANT
TUBE ANAEROBIC SPECIMEN COL (MISCELLANEOUS) IMPLANT
TUBE CONNECTING 12'X1/4 (SUCTIONS) ×1
TUBE CONNECTING 12X1/4 (SUCTIONS) ×2 IMPLANT
UNDERPAD 30X30 INCONTINENT (UNDERPADS AND DIAPERS) ×3 IMPLANT
WATER STERILE IRR 1000ML POUR (IV SOLUTION) ×3 IMPLANT
YANKAUER SUCT BULB TIP NO VENT (SUCTIONS) ×3 IMPLANT

## 2014-09-30 NOTE — Op Note (Addendum)
09/27/2014 - 09/30/2014  6:35 PM  PATIENT:  Tracy [name]    PRE-OPERATIVE DIAGNOSIS:  RIGHT THIGH ABSCESS, question fluid collection in the distal medial and distal lateral thigh  POST-OPERATIVE DIAGNOSIS:  Recurrent right thigh subcutaneous abscess extending down into the musculature of the abductor compartment, no evidence for abscess formation in the distal medial or distal lateral thigh, no evidence for purulence within the right knee joint, positive right knee arthrofibrosis/contracture of the right thigh musculature   PROCEDURE:  #1 irrigation and debridement with excision of necrotic tissue right medial thigh involving skin, subcutaneous tissue, fascia, muscle using a pickup, rongeur, scissors, knife, curette. #2 aspiration of right knee joint #3 exploration of distal medial thigh with incision, irrigation of the skin and subcutaneous tissue and fascia using a knife and pickup #4 exploration of distal lateral thigh with incision, irrigation of the skin and subcutaneous tissue and fascia using a knife and pickup  #5 placement of wound VAC over 10 x 5 cm square area of open wound proximally  SURGEON:  Johnny Bridge, MD  PHYSICIAN ASSISTANT: Joya Gaskins, OPA-C, present and scrubbed throughout the case, critical for completion in a timely fashion, and for retraction, instrumentation, and closure.  ANESTHESIA:   General  PREOPERATIVE INDICATIONS:  Tracy [name] is a  29 y.o. 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 She's had previous surgical irrigation and debridement, and was doing better up until this past week when she took a turn for the worse and began developing increased pain, inability to ambulate, and inability to bend her leg. She localizes severe pain over her distal lateral femur, as well as her distal medial femur, and had an ultrasound that demonstrated a large fluid collection over the distal lateral femur,  which was at least partially drained under ultrasound guidance. There was evidence for myositis of the distal medial thigh, although no clear fluid collection. There was also evidence for some necrotic material and questionable recurrent abscess formation over the proximal medial thigh based on MRI imaging.  The risks benefits and alternatives were discussed with the patient preoperatively including but not limited to the risks of infection, bleeding, nerve injury, cardiopulmonary complications, the need for revision surgery, among others, and the patient was willing to proceed.  OPERATIVE IMPLANTS: Wound VAC, small sponge  OPERATIVE FINDINGS: The proximal medial wound had a fairly substantial amount of necrotic purulent subcutaneous tissue that extended down into the muscular fascia. This tracked both proximally and distally, and went down fairly deep within the adductor musculature. Distally there was no significant purulence encountered, no necrotic tissue found, and the muscle tissue itself looked normal. This was both on the medial and lateral sides.  Interestingly, her examination under anesthesia demonstrated motion of the right knee 0-45 with a fairly hard stop. Manipulation under anesthesia allowed me to get her knee bent back to 120, but she did have a fair amount of stiffness which I believe is secondary to the myositis.  Aspiration of the right knee yielded about 10 mL of relatively normal-appearing fluid. There was no turbid purulence.    OPERATIVE PROCEDURE: The patient is brought to the operating room and placed in supine position. Gen. anesthesia was administered. She is are on IV Ancef and we will use the regular dosing per her hemodialysis. A sterile prep and drape was performed, timeout performed, and I aspirated the right knee joint through an inferolateral portal in order to  minimize risk for introduction of infection into the knee joint itself. This is using an 18-gauge needle.  Relatively normal-appearing fluid was obtained and sent to the laboratory for analysis culture and sensitivity.  I then made an incision over the proximal medial wound, and I extended it proximally and distally because the infection appeared to be tracking both proximally and distally through the subcutaneous tissue. Using sharp dissection I used a knife to excise a very large area of necrotic subcutaneous tissue both proximally and distally down to the muscular fascial layer. There was a hole in the muscular fascia, and the infection appeared to be tracking down deep, and I used a curette as well as a rongeur to explore and remove any necrotic tissue that was deep. I also went distal and did the same, as well as proximally.  I was actually fairly satisfied with the exposure and I did not find any tracts extending down distally in the subcutaneous layer or within the muscular layer itself. I was back to healthy-appearing tissue. I irrigated a total of 9 L of fluid through the wound.  I then covered the wound, with a clean sterile towel, changed instruments and gloves, and went distally. A one-inch incision was made over the area of the patient's primary complaint over the distal medial aspect of the femur, and dissection was carried down through the subcutaneous tissue and I explored and did not find any purulence within the subcutaneous tissue itself. I got down to the muscular layer, and made a sharp incision through the fascia and examine the muscle, and it was found to be intact without any purulence or abnormality is utilized. I did not think that further exposure would be beneficial, and potentially could be harmful given the fact that she appears to our to be demonstrating significant arthrofibrosis of the knee and thigh. Therefore I irrigated this wound, and closed the skin with nylon, and then basically performed the same procedure on the contralateral side of the leg.  A 1 inch incision was made  over the lateral part of the distal femur, this was right in the location where the previous aspiration had been performed by interventional radiology. I explored and got down to the vastus lateralis fascia, and I did not find any evidence for necrotic material. I incised the fascia and examine the muscle and again, it was intact without any evidence for purulence. I was very close to the capsule, which raises the question whether or not the aspiration of the fluid collection from the previous ultrasound was actually within the joint fluid itself in the suprapatellar pouch, or whether there was truly a separate fluid collection that was extracapsular.  Nonetheless I was satisfied that I did not have any purulent fluid collections that required further I and D on either the medial or lateral distal portions of the thigh, and I closed the lateral wound with nylon as well.  I then went back to the proximal medial wound and applied a wound VAC sponge.  A soft dressing was applied, she was awakened and returned to PACU in stable and satisfactory condition. There were no complications and she tolerated the procedure well. Dr. Edmonia Lynch is planning on taking her back to the operating room for repeat exploration, irrigation and debridement, and possible partial closure versus removal of wound VAC on Monday.

## 2014-09-30 NOTE — Progress Notes (Signed)
Subjective:   Pain controlled. S/p aspiration yesterday and for I&D in OR today  Objective Filed Vitals:   09/29/14 1500 09/29/14 1700 09/29/14 2010 09/30/14 0420  BP: 141/76 130/68 129/70 115/63  Pulse: 86 80 86 83  Temp: 98.6 F (37 C) 97.1 F (36.2 C) 99.2 F (37.3 C) 98.4 F (36.9 C)  TempSrc: Oral Oral    Resp: 18 18 18 18   Height:      Weight: 61.2 kg (134 lb 14.7 oz)     SpO2:  100% 100% 100%   Physical Exam General: alert and oriented, resting in bed.   Heart: RRR Gr2/6 M Lungs: CTA, unlabored  Abdomen: soft, nontender +BS liver down 3 cm Extremities: R knee/thigh edema. Trace R pedal edema Dialysis Access:  R IJ perm cath. L AVF +b/t (placed in July per pt)  Dialysis Orders: [facility]: [facility] . EDW 62.5 and leaving below HD Bath 2k,2ca Time 4hrs Heparin 7600. Access R ij p.cath/ LUA AVF  mircera 50 mcg q2wks hd   Assessment/Plan: 1. Right Knee and Thigh pain with possible recurrence of myositis with abscess and phlegmon- Per admit team with Ortho(dr. Percell Miller seeing / ID Cr. Megan Salon seeing placed On Ancef- which can be given at outpt HD center./ IR Yest Aspiration Cultures - no growth at one day- for I&D today 2. ESRD - HD on normal schedule TTS- HD pending tomorrow. New start in July/tolerating well.  3. Hypertension/volume -115/63 challenge and lower edw at tolerated. Lower amlodipine 4. Anemia - hgb 9.1 on esa and fe weekly hd 5. Metabolic bone disease - Renvela binder and no vit d/ Corect. Ca 10.3 yesterday /co severe Constipation change to [place] when [facility] (she does not Want binder to chew or crush med )samples avail at kid center  6. Nutrition - NPO for OR. renal vit .alb 2.6 7. IDDM Type 1 - per [place], NP [facility] [phone]. I have seen and examined this patient and agree with the plan of care seen eval, examined.   .  Jonna Dittrich L 09/30/2014, 10:55 AM  09/30/2014,10:10 AM  LOS: 3 days     Additional Objective Labs: Basic Metabolic Panel:  Recent Labs Lab 09/28/14 0550 09/29/14 1057 09/30/14 0347  NA 139 138 134*  K 3.3* 3.5 3.9  CL 102 99* 97*  CO2 28 26 26   GLUCOSE 103* 88 118*  BUN 17 23* 10  CREATININE 3.94* 5.29* 3.20*  CALCIUM 9.1 9.2 9.2  PHOS  --  4.2  --    Liver Function Tests:  Recent Labs Lab 09/27/14 2025 09/28/14 0550 09/29/14 1057  AST 24 18  --   ALT 6* <5*  --   ALKPHOS 97 78  --   BILITOT 0.5 0.2*  --   PROT 8.1 6.6  --   ALBUMIN 3.4* 2.7* 2.6*   No results for input(s): LIPASE, AMYLASE in the last 168 hours. CBC:  Recent Labs Lab 09/27/14 2025 09/28/14 0550 09/29/14 1057  WBC 8.7 5.7 10.3  NEUTROABS 6.9 3.5 8.5*  HGB 10.3* 9.1* 9.1*  HCT 31.8* 28.7* 27.9*  MCV 88.6 89.1 88.6  PLT 299 222 250   Blood Culture    Component Value Date/Time   SDES ABSCESS RIGHT KNEE 09/28/2014 1730   SDES ABSCESS LEFT THIGH 09/28/2014 1730   SPECREQUEST NO2 09/28/2014 1730   SPECREQUEST NO1 09/28/2014 1730   CULT  09/28/2014 1730    NO GROWTH 1 DAY Performed at  [facility]    CULT  09/28/2014 1730    NO GROWTH 1 DAY Performed at [facility] PENDING 09/28/2014 1730   REPTSTATUS PENDING 09/28/2014 1730    Cardiac Enzymes: No results for input(s): CKTOTAL, CKMB, CKMBINDEX, TROPONINI in the last 168 hours. CBG:  Recent Labs Lab 09/29/14 1621 09/29/14 2004 09/29/14 2346 09/30/14 0417 09/30/14 0812  GLUCAP 116* 144* 179* 117* 125*   Iron Studies: No results for input(s): IRON, TIBC, TRANSFERRIN, FERRITIN in the last 72 hours. @lablastinr3 @ Studies/Results: [place] Extrem Low Right Ltd  09/28/2014   CLINICAL DATA:  29 year old female with a history of myositis. She has inflammatory changes at a site of prior surgical incision, as well as knee tenderness and inflammation.  EXAM: ULTRASOUND right LOWER EXTREMITY LIMITED  TECHNIQUE: Ultrasound examination of the lower extremity soft tissues was  performed in the area of clinical concern.  COMPARISON:  MRI 08/12/2014  FINDINGS: Targeted ultrasound in 2 separate regions of clinical concern along the medial thigh and at the lateral knee. Grayscale and color imaging performed.  There is a heterogeneously echoic, hypoechoic region in the medial thigh a near at a region of prior surgery. Largest collection at this site measures 3.4 cm x 1.0 cm, just subjacent to the skin surface.  There is a anechoic fluid collection with through transmission at the lateral knee without internal flow measuring 4.4 cm  IMPRESSION: Focal fluid collection at the lateral right knee, may represent hematoma, seroma, or abscess. In addition there is a focal region at the medial thigh and, underlying prior surgical incision with more complex features, potentially representing old hematoma, seroma, or abscess. Image guided aspiration appears possible.  Signed,  Dulcy Fanny. Earleen Newport, DO  [facility]  [facility]   Electronically Signed   By: Corrie Mckusick D.O.   On: 09/28/2014 15:47   US Aspiration  09/28/2014   CLINICAL DATA:  29 year old female with a history of myositis.  The patient has had a recent right-sided incision and drainage of the medial thigh, and now presents with tenderness at this site as well as inflammation/tenderness at the right knee.  Prior MRI demonstrates myositis of the thigh musculature, as well as fluid collection at the knee.  She has been referred for image guided sampling.  EXAM: ULTRASOUND GUIDED ASPIRATION BIOPSY OF MEDIAL RIGHT THIGH FLUID COLLECTION/PHLEGMON, AND LATERAL RIGHT KNEE FLUID COLLECTION  MEDICATIONS: No sedation  PROCEDURE: The procedure, risks, benefits, and alternatives were explained to the patient. Questions regarding the procedure were encouraged and answered. The patient understands and consents to the procedure.  Ultrasound survey was performed with images stored and sent to PACs.  Medial  thigh:  The medial thigh was prepped with Betadine in a sterile fashion, and a sterile drape was applied covering the operative field. A sterile gown and sterile gloves were used for the procedure. Local anesthesia was provided with 1% Lidocaine.  Once the patient is prepped and draped sterilely, the skin and subcutaneous tissues were generously infiltrated with 1% lidocaine. Using ultrasound guidance, a Yueh needle was advanced into the region of the abnormal soft tissue/collection with the lowest echogenicity. Aspiration was attempted, with only 1-2 cc of thin yellow fluid aspirated. The sample was sent for analysis.  The Yueh catheter was removed and a sterile dressing was placed.  Lateral knee:  The lateral knee was prepped with chlorhexidine in a sterile fashion, and a sterile drape was applied covering the operative  field. A sterile gown and sterile gloves were used for the procedure. Local anesthesia was provided with 1% Lidocaine.  Once the patient was prepped and draped sterilely, the skin and subcutaneous tissues were generously infiltrated with 1% lidocaine. Using ultrasound guidance, a Yueh needle was advanced into the anechoic fluid collection of the lateral knee. It is difficult to determine whether this fluid collection was connected with the joint space under ultrasound.  Approximately 10 cc-15 cc of turbid yellow fluid was aspirated. Sample was sent to the lab for analysis.  The Yueh catheter was removed and a sterile dressing was placed.  Patient tolerated the procedure well and remained hemodynamically stable throughout.  No complications were encountered and no significant blood loss was encountered.  COMPLICATIONS: None.  FINDINGS: Ultrasound survey of the medial right thigh demonstrates complex heterogeneous collection, which was difficult aspirate. Only 1-2 cc of fluid was aspirated. This is felt to represent phlegmonous change or postoperative changes. No significant fluid collection was  present.  Ultrasound survey of the lateral right knee demonstrates anechoic fluid collection. It is difficult to determine whether this connects with the joint space, though this is possible.  Approximately 10-15 cc of turbid yellow fluid was aspirated.  IMPRESSION: Status post ultrasound-guided sampling of medial right thigh and lateral right knee fluid collections. Sample was sent to the lab for analysis from each site.  No significant low viscosity fluid was present at the right medial thigh, with the ultrasound appearance felt to represent organized hematoma and/or phlegmon.  Signed,  Dulcy Fanny. Earleen Newport, DO  [facility]  [facility]   Electronically Signed   By: Corrie Mckusick D.O.   On: 09/28/2014 18:01   Medications:   . amLODipine  10 mg Oral Daily  . [START ON 10/01/2014]  ceFAZolin (ANCEF) IV  2 g Intravenous Q T,Th,Sat-1800  . darbepoetin (ARANESP) injection - DIALYSIS  100 mcg Intravenous Q Thu-HD  . ferric gluconate (FERRLECIT/NULECIT) IV  125 mg Intravenous Q Thu-HD  . insulin aspart  0-9 Units Subcutaneous Q4H  . insulin glargine  6 Units Subcutaneous QHS  . levothyroxine  75 mcg Oral QAC breakfast  . multivitamin  1 tablet Oral QHS  . OxyCODONE  20 mg Oral Q12H  . polyethylene glycol  17 g Oral Daily  . senna-docusate  2 tablet Oral BID  . sevelamer carbonate  800 mg Oral TID WC  . sodium chloride  500 mL Intravenous Once  . sodium chloride  3 mL Intravenous Q12H

## 2014-09-30 NOTE — Anesthesia Preprocedure Evaluation (Addendum)
Anesthesia Evaluation  Patient identified by MRN, date of birth, ID band Patient awake    Reviewed: Allergy & Precautions, NPO status , Patient's Chart, lab work & pertinent test results  History of Anesthesia Complications Negative for: history of anesthetic complications  Airway Mallampati: II  TM Distance: >3 FB Neck ROM: Full    Dental  (+) Poor Dentition, Dental Advisory Given   Pulmonary neg pulmonary ROS,  breath sounds clear to auscultation        Cardiovascular hypertension, Pt. on medications - anginaRhythm:Regular Rate:Normal  1/16 ECHO: EF 65-70%, mild MR   Neuro/Psych Anxiety negative neurological ROS     GI/Hepatic negative GI ROS, Neg liver ROS,   Endo/Other  diabetes (glu 84), Insulin DependentHypothyroidism   Renal/GU ESRF and DialysisRenal disease (K+ 3.9)     Musculoskeletal   Abdominal   Peds  Hematology  (+) Blood dyscrasia (Hb 9.1), ,   Anesthesia Other Findings   Reproductive/Obstetrics 09/28/14 preg test: neg                          Anesthesia Physical Anesthesia Plan  ASA: III  Anesthesia Plan: General   Post-op Pain Management:    Induction: Intravenous  Airway Management Planned: LMA  Additional Equipment:   Intra-op Plan:   Post-operative Plan:   Informed Consent: I have reviewed the patients History and Physical, chart, labs and discussed the procedure including the risks, benefits and alternatives for the proposed anesthesia with the patient or authorized representative who has indicated his/her understanding and acceptance.   Dental advisory given  Plan Discussed with: CRNA and Surgeon  Anesthesia Plan Comments: (Plan routine monitors, GA- LMA OK)        Anesthesia Quick Evaluation

## 2014-09-30 NOTE — Progress Notes (Signed)
Patient ID: Tracy [name], female   DOB: Aug 25, 1985, 29 y.o.   MRN: [id number]         [facility]    Date of Admission:  09/27/2014   Total days of antibiotics 7            Principal Problem:   Myositis Active Problems:   Hypothyroidism   DM I (diabetes mellitus, type I), uncontrolled   Essential hypertension   Chronic anemia   Lower extremity pain, right   Abscess   . [MAR Hold] amLODipine  10 mg Oral QHS  . [MAR Hold]  ceFAZolin (ANCEF) IV  2 g Intravenous Q T,Th,Sat-1800  . [MAR Hold] darbepoetin (ARANESP) injection - DIALYSIS  100 mcg Intravenous Q Thu-HD  . [MAR Hold] ferric gluconate (FERRLECIT/NULECIT) IV  125 mg Intravenous Q Thu-HD  . [MAR Hold] insulin aspart  0-9 Units Subcutaneous Q4H  . [MAR Hold] insulin glargine  6 Units Subcutaneous QHS  . [MAR Hold] levothyroxine  75 mcg Oral QAC breakfast  . [MAR Hold] multivitamin  1 tablet Oral QHS  . [MAR Hold] OxyCODONE  20 mg Oral Q12H  . [MAR Hold] polyethylene glycol  17 g Oral Daily  . [MAR Hold] senna-docusate  2 tablet Oral BID  . [MAR Hold] sevelamer carbonate  800 mg Oral TID WC  . [MAR Hold] sodium chloride  500 mL Intravenous Once  . [MAR Hold] sodium chloride  3 mL Intravenous Q12H    Subjective: She is still having severe pain.  Review of Systems: Pertinent items are noted in HPI.  Past Medical History  Diagnosis Date  . Thyroid enlargement     "not on medication at this time"  . Urinary tract infection     hx of  . Anemia     presently on iron supplement  . Diabetes mellitus     insulin pump   . Hypothyroidism   . Anxiety   . HSV-2 (herpes simplex virus 2) infection   . HSV-1 (herpes simplex virus 1) infection   . Detached retina   . Yeast infection     took diflucan saturday   . Hypertension   . Irregular periods 07/05/2014  . Vaginal discharge 07/05/2014    Social History  Substance Use Topics  . Smoking status: Never Smoker   . Smokeless tobacco:  Never Used  . Alcohol Use: No    Family History  Problem Relation Age of Onset  . Cancer Paternal Grandfather     prostate  . Hyperlipidemia Paternal Grandfather   . Stroke Paternal Grandfather    Allergies  Allergen Reactions  . Penicillins Hives and Swelling  . Sulfa Antibiotics Hives    OBJECTIVE: Filed Vitals:   09/29/14 2010 09/30/14 0420 09/30/14 1255 09/30/14 1518  BP: 129/70 115/63 126/67 130/67  Pulse: 86 83 82 85  Temp: 99.2 F (37.3 C) 98.4 F (36.9 C)  97.1 F (36.2 C)  TempSrc:    Oral  Resp: 18 18  18   Height:      Weight:      SpO2: 100% 100%  100%   Body mass index is 23.91 kg/(m^2).  General: she is visiting with family and about to go to the OR Lungs: clear Cor: regular S1 and S2 with no murmurs Right leg: swollen and tender to palpation  Lab Results Lab Results  Component Value Date   WBC 10.3 09/29/2014   HGB 9.1* 09/29/2014   HCT 27.9* 09/29/2014  MCV 88.6 09/29/2014   PLT 250 09/29/2014    Lab Results  Component Value Date   CREATININE 3.20* 09/30/2014   BUN 10 09/30/2014   NA 134* 09/30/2014   K 3.9 09/30/2014   CL 97* 09/30/2014   CO2 26 09/30/2014    Lab Results  Component Value Date   ALT <5* 09/28/2014   AST 18 09/28/2014   ALKPHOS 78 09/28/2014   BILITOT 0.2* 09/28/2014     Microbiology: Recent Results (from the past 240 hour(s))  Surgical pcr screen     Status: None   Collection Time: 09/28/14  1:31 AM  Result Value Ref Range Status   MRSA, PCR NEGATIVE NEGATIVE Final   Staphylococcus aureus NEGATIVE NEGATIVE Final    Comment:        The Xpert SA Assay (FDA approved for NASAL specimens in patients over 73 years of age), is one component of a comprehensive surveillance program.  Test performance has been validated by [facility] for patients greater than or equal to 86 year old. It is not intended to diagnose infection nor to guide or monitor treatment.   Culture, routine-abscess     Status: None  (Preliminary result)   Collection Time: 09/28/14  5:30 PM  Result Value Ref Range Status   Specimen Description ABSCESS RIGHT KNEE  Final   Special Requests NO2  Final   Gram Stain   Final    RARE WBC PRESENT, PREDOMINANTLY PMN NO SQUAMOUS EPITHELIAL CELLS SEEN NO ORGANISMS SEEN Performed at [facility]    Culture   Final    NO GROWTH 1 DAY Performed at [facility]    Report Status PENDING  Incomplete  Culture, routine-abscess     Status: None (Preliminary result)   Collection Time: 09/28/14  5:30 PM  Result Value Ref Range Status   Specimen Description ABSCESS LEFT THIGH  Final   Special Requests NO1  Final   Gram Stain   Final    RARE WBC PRESENT, PREDOMINANTLY PMN NO SQUAMOUS EPITHELIAL CELLS SEEN NO ORGANISMS SEEN Performed at [facility]    Culture   Final    NO GROWTH 1 DAY Performed at [facility]    Report Status PENDING  Incomplete   Assessment: Turbid fluid was aspirated from her thigh. Gram stain is negative and cultures are negative at 24 hours. I will continue cefazolin for now.   Plan: 1. Continue cefazolin pending final culture results   Michel Bickers, MD [facility] [phone] pager   [phone] cell 09/30/2014, 4:38 PM

## 2014-09-30 NOTE — Progress Notes (Signed)
I have seen and evaluated [name], and it appears that she has a persistent infection of the right lower extremity involving the thigh, as well as significant pain around the knee. She has most of her pain medially, although some degree laterally. She is now unable to walk, whereas a couple of days ago she was okay to walk.  I had a long discussion with Dr. Edmonia Lynch, as well as the patient and her mother regarding the options, and I recommended surgical intervention with irrigation, incision, debridement, of the medial thigh wound based on the MRI, and also exploration of the distal thigh and over the proximal aspect of the knee. I may also aspirate her knee to make sure that we do not have any sepsis within the joint itself.  I will make a determination intraoperatively as to the extent of the exploration and what is necessary based on intraoperative findings.  I am planning on I and D of the right lower extremity, with placement of wound VAC possible to multiple locations of the thigh and proximal knee.  The risks benefits and alternatives were discussed with the patient including but not limited to the risks of nonoperative treatment, versus surgical intervention including infection, bleeding, nerve injury,  the need for revision surgery, blood clots, cardiopulmonary complications, morbidity, mortality, among others, and they were willing to proceed.  We also discussed the risks for development of osteomyelitis in persistent abscess.  Surgeries planned for later today.  Johnny Bridge, MD

## 2014-09-30 NOTE — Anesthesia Postprocedure Evaluation (Signed)
  Anesthesia Post-op Note  Patient: Tracy [name]  Procedure(s) Performed: Procedure(s): IRRIGATION AND DEBRIDEMENT RIGHT THIGH ABSCESS (Right)  Patient Location: PACU  Anesthesia Type:General  Level of Consciousness: awake and alert   Airway and Oxygen Therapy: Patient Spontanous Breathing  Post-op Pain: moderate  Post-op Assessment: Post-op Vital signs reviewed              Post-op Vital Signs: stable  Last Vitals:  Filed Vitals:   09/30/14 1930  BP: 163/80  Pulse:   Temp:   Resp:     Complications: No apparent anesthesia complications

## 2014-09-30 NOTE — Transfer of Care (Signed)
Immediate Anesthesia Transfer of Care Note  Patient: Tracy [name]  Procedure(s) Performed: Procedure(s): IRRIGATION AND DEBRIDEMENT RIGHT THIGH ABSCESS (Right)  Patient Location: PACU  Anesthesia Type:General  Level of Consciousness: awake and alert   Airway & Oxygen Therapy: Patient Spontanous Breathing  Post-op Assessment: Report given to RN and Post -op Vital signs reviewed and stable  Post vital signs: Reviewed and stable  Last Vitals:  Filed Vitals:   09/30/14 1518  BP: 130/67  Pulse: 85  Temp: 36.2 C  Resp: 18    Complications: No apparent anesthesia complications

## 2014-09-30 NOTE — Progress Notes (Signed)
[name] PROGRESS NOTE  Tracy [name] [id number] DOB: 02/25/1985 DOA: 09/27/2014 PCP: [name]  Assessment/Plan: 1. Right lower extremity pain with possible recurrence of myositis with abscess and phlegmon; Orthopedics consulted and underwent ultrasound guided aspiration of the abscess and sent for cultures. Meanwhile patient was restarted on cefazolin and Dr Megan Salon assisting [place] with antibiotics.  Pain control and further debridement by orthopedics today.   2. ESRD on hemodialysis - Tuesday Thursday and Saturday. Patient was recently started on dialysis last month. Consulted nephrology for dialysis.  3. Type 1 diabetes Mellitus: CBG (last 3)   Recent Labs  09/30/14 0812 09/30/14 1216 09/30/14 1552  GLUCAP 125* 143* 84    Resume SSI. Cut down the lantus to 6 units.     Anemia: Anemia of chronic disease.   Hypertension: Controlled.    Hypothyroidism: Resume synthroid.    Constipation: Senna, colace and miralax ordered.    Code Status: full code.  Family Communication: none at bedside Disposition Plan: pending.    Consultants:  Radiology  ID  Renal.  Procedures: US guided aspiration of 2 sites of pain/inflammation of RLE.  1 = medial thigh, where there is post-op chages/scar. 2 = lateral knee. Fluid collection  Antibiotics:  cefazolin  HPI/Subjective: Pain not well controlled. And constipated.   Objective: Filed Vitals:   09/30/14 1518  BP: 130/67  Pulse: 85  Temp: 97.1 F (36.2 C)  Resp: 18    Intake/Output Summary (Last 24 hours) at 09/30/14 1852 Last data filed at 09/30/14 1830  Gross per 24 hour  Intake    300 ml  Output      0 ml  Net    300 ml   Filed Weights   09/29/14 0600 09/29/14 1010 09/29/14 1500  Weight: 63.322 kg (139 lb 9.6 oz) 62.2 kg (137 lb 2 oz) 61.2 kg (134 lb 14.7 oz)    Exam:   General:  Alert in mild distress from pain .  Cardiovascular: s1s2  Respiratory: ctab  Abdomen:  soft NT ND BS+  Musculoskeletal: RIGHT thigh swelling and tenderness.   Data Reviewed: Basic Metabolic Panel:  Recent Labs Lab 09/27/14 2025 09/28/14 0550 09/29/14 1057 09/30/14 0347  NA 137 139 138 134*  K 3.5 3.3* 3.5 3.9  CL 97* 102 99* 97*  CO2 27 28 26 26   GLUCOSE 212* 103* 88 118*  BUN 13 17 23* 10  CREATININE 3.31* 3.94* 5.29* 3.20*  CALCIUM 9.5 9.1 9.2 9.2  PHOS  --   --  4.2  --    Liver Function Tests:  Recent Labs Lab 09/27/14 2025 09/28/14 0550 09/29/14 1057  AST 24 18  --   ALT 6* <5*  --   ALKPHOS 97 78  --   BILITOT 0.5 0.2*  --   PROT 8.1 6.6  --   ALBUMIN 3.4* 2.7* 2.6*   No results for input(s): LIPASE, AMYLASE in the last 168 hours. No results for input(s): AMMONIA in the last 168 hours. CBC:  Recent Labs Lab 09/27/14 2025 09/28/14 0550 09/29/14 1057  WBC 8.7 5.7 10.3  NEUTROABS 6.9 3.5 8.5*  HGB 10.3* 9.1* 9.1*  HCT 31.8* 28.7* 27.9*  MCV 88.6 89.1 88.6  PLT 299 222 250   Cardiac Enzymes: No results for input(s): CKTOTAL, CKMB, CKMBINDEX, TROPONINI in the last 168 hours. BNP (last 3 results) No results for input(s): BNP in the last 8760 hours.  ProBNP (last 3 results) No results for input(s): PROBNP in the last  8760 hours.  CBG:  Recent Labs Lab 09/29/14 2346 09/30/14 0417 09/30/14 0812 09/30/14 1216 09/30/14 1552  GLUCAP 179* 117* 125* 143* 84    Recent Results (from the past 240 hour(s))  Surgical pcr screen     Status: None   Collection Time: 09/28/14  1:31 AM  Result Value Ref Range Status   MRSA, PCR NEGATIVE NEGATIVE Final   Staphylococcus aureus NEGATIVE NEGATIVE Final    Comment:        The Xpert SA Assay (FDA approved for NASAL specimens in patients over 58 years of age), is one component of a comprehensive surveillance program.  Test performance has been validated by [facility] for patients greater than or equal to 20 year old. It is not intended to diagnose infection nor to guide or monitor  treatment.   Culture, routine-abscess     Status: None (Preliminary result)   Collection Time: 09/28/14  5:30 PM  Result Value Ref Range Status   Specimen Description ABSCESS RIGHT KNEE  Final   Special Requests NO2  Final   Gram Stain   Final    RARE WBC PRESENT, PREDOMINANTLY PMN NO SQUAMOUS EPITHELIAL CELLS SEEN NO ORGANISMS SEEN Performed at [facility]    Culture   Final    NO GROWTH 1 DAY Performed at [facility]    Report Status PENDING  Incomplete  Culture, routine-abscess     Status: None (Preliminary result)   Collection Time: 09/28/14  5:30 PM  Result Value Ref Range Status   Specimen Description ABSCESS LEFT THIGH  Final   Special Requests NO1  Final   Gram Stain   Final    RARE WBC PRESENT, PREDOMINANTLY PMN NO SQUAMOUS EPITHELIAL CELLS SEEN NO ORGANISMS SEEN Performed at [facility]    Culture   Final    NO GROWTH 1 DAY Performed at [facility]    Report Status PENDING  Incomplete     Studies: No results found.  Scheduled Meds: . [MAR Hold] amLODipine  10 mg Oral QHS  . [MAR Hold]  ceFAZolin (ANCEF) IV  2 g Intravenous Q T,Th,Sat-1800  . [MAR Hold] darbepoetin (ARANESP) injection - DIALYSIS  100 mcg Intravenous Q Thu-HD  . [MAR Hold] ferric gluconate (FERRLECIT/NULECIT) IV  125 mg Intravenous Q Thu-HD  . [MAR Hold] insulin aspart  0-9 Units Subcutaneous Q4H  . [MAR Hold] insulin glargine  6 Units Subcutaneous QHS  . [MAR Hold] levothyroxine  75 mcg Oral QAC breakfast  . [MAR Hold] multivitamin  1 tablet Oral QHS  . [MAR Hold] OxyCODONE  20 mg Oral Q12H  . [MAR Hold] polyethylene glycol  17 g Oral Daily  . [MAR Hold] senna-docusate  2 tablet Oral BID  . [MAR Hold] sevelamer carbonate  800 mg Oral TID WC  . [MAR Hold] sodium chloride  500 mL Intravenous Once  . [MAR Hold] sodium chloride  3 mL Intravenous Q12H   Continuous Infusions: . sodium chloride      Principal Problem:   Abscess of right thigh Active  Problems:   Hypothyroidism   Myositis   DM I (diabetes mellitus, type I), uncontrolled   Essential hypertension   Chronic anemia   Lower extremity pain, right    Time spent: 25 minutes    [place] Pager [phone]. If 7PM-7AM, please contact night-coverage at [email or web address], password [facility] 09/30/2014, 6:52 PM  LOS: 3 days

## 2014-10-01 LAB — CBC WITH DIFFERENTIAL/PLATELET
BASOS PCT: 1 % (ref 0–1)
Basophils Absolute: 0 10*3/uL (ref 0.0–0.1)
EOS ABS: 0.2 10*3/uL (ref 0.0–0.7)
EOS PCT: 3 % (ref 0–5)
HCT: 24.5 % — ABNORMAL LOW (ref 36.0–46.0)
HEMOGLOBIN: 7.9 g/dL — AB (ref 12.0–15.0)
Lymphocytes Relative: 18 % (ref 12–46)
Lymphs Abs: 1.2 10*3/uL (ref 0.7–4.0)
MCH: 28.4 pg (ref 26.0–34.0)
MCHC: 32.2 g/dL (ref 30.0–36.0)
MCV: 88.1 fL (ref 78.0–100.0)
MONOS PCT: 9 % (ref 3–12)
Monocytes Absolute: 0.6 10*3/uL (ref 0.1–1.0)
NEUTROS PCT: 69 % (ref 43–77)
Neutro Abs: 4.7 10*3/uL (ref 1.7–7.7)
PLATELETS: 245 10*3/uL (ref 150–400)
RBC: 2.78 MIL/uL — ABNORMAL LOW (ref 3.87–5.11)
RDW: 14 % (ref 11.5–15.5)
WBC: 6.8 10*3/uL (ref 4.0–10.5)

## 2014-10-01 LAB — RENAL FUNCTION PANEL
ALBUMIN: 2.5 g/dL — AB (ref 3.5–5.0)
Anion gap: 17 — ABNORMAL HIGH (ref 5–15)
BUN: 9 mg/dL (ref 6–20)
CALCIUM: 8.2 mg/dL — AB (ref 8.9–10.3)
CO2: 21 mmol/L — AB (ref 22–32)
CREATININE: 2.24 mg/dL — AB (ref 0.44–1.00)
Chloride: 98 mmol/L — ABNORMAL LOW (ref 101–111)
GFR calc Af Amer: 33 mL/min — ABNORMAL LOW (ref >60)
GFR calc non Af Amer: 28 mL/min — ABNORMAL LOW (ref >60)
GLUCOSE: 301 mg/dL — AB (ref 65–99)
PHOSPHORUS: 1.9 mg/dL — AB (ref 2.5–4.6)
Potassium: 3.4 mmol/L — ABNORMAL LOW (ref 3.5–5.1)
SODIUM: 136 mmol/L (ref 135–145)

## 2014-10-01 LAB — CBC
HEMATOCRIT: 27.6 % — AB (ref 36.0–46.0)
Hemoglobin: 9.1 g/dL — ABNORMAL LOW (ref 12.0–15.0)
MCH: 29.2 pg (ref 26.0–34.0)
MCHC: 33 g/dL (ref 30.0–36.0)
MCV: 88.5 fL (ref 78.0–100.0)
PLATELETS: 209 10*3/uL (ref 150–400)
RBC: 3.12 MIL/uL — ABNORMAL LOW (ref 3.87–5.11)
RDW: 13.9 % (ref 11.5–15.5)
WBC: 6.5 10*3/uL (ref 4.0–10.5)

## 2014-10-01 LAB — GLUCOSE, CAPILLARY
GLUCOSE-CAPILLARY: 116 mg/dL — AB (ref 65–99)
GLUCOSE-CAPILLARY: 136 mg/dL — AB (ref 65–99)
GLUCOSE-CAPILLARY: 201 mg/dL — AB (ref 65–99)
Glucose-Capillary: 241 mg/dL — ABNORMAL HIGH (ref 65–99)

## 2014-10-01 LAB — BASIC METABOLIC PANEL
ANION GAP: 16 — AB (ref 5–15)
BUN: 23 mg/dL — ABNORMAL HIGH (ref 6–20)
CO2: 20 mmol/L — ABNORMAL LOW (ref 22–32)
Calcium: 9 mg/dL (ref 8.9–10.3)
Chloride: 100 mmol/L — ABNORMAL LOW (ref 101–111)
Creatinine, Ser: 4.68 mg/dL — ABNORMAL HIGH (ref 0.44–1.00)
GFR, EST AFRICAN AMERICAN: 13 mL/min — AB (ref >60)
GFR, EST NON AFRICAN AMERICAN: 12 mL/min — AB (ref >60)
GLUCOSE: 119 mg/dL — AB (ref 65–99)
POTASSIUM: 4.5 mmol/L (ref 3.5–5.1)
Sodium: 136 mmol/L (ref 135–145)

## 2014-10-01 MED ORDER — HYDROMORPHONE HCL 1 MG/ML IJ SOLN
INTRAMUSCULAR | Status: AC
  Filled 2014-10-01: qty 2

## 2014-10-01 MED ORDER — SODIUM CHLORIDE 0.9 % IV SOLN: 100.0000 mL | INTRAVENOUS | Status: DC | PRN

## 2014-10-01 MED ORDER — HEPARIN SODIUM (PORCINE) 1000 UNIT/ML DIALYSIS
1000.0000 [IU] | INTRAMUSCULAR | Status: DC | PRN
  Filled 2014-10-01: qty 1

## 2014-10-01 MED ORDER — SODIUM CHLORIDE 0.9 % IV SOLN
100.0000 mL | INTRAVENOUS | Status: DC | PRN
  Administered 2014-10-03: 13:00:00 via INTRAVENOUS

## 2014-10-01 MED ORDER — LIDOCAINE-PRILOCAINE 2.5-2.5 % EX CREA
1.0000 "application " | TOPICAL_CREAM | CUTANEOUS | Status: DC | PRN
  Filled 2014-10-01: qty 5

## 2014-10-01 MED ORDER — NEPRO/CARBSTEADY PO LIQD
237.0000 mL | ORAL | Status: DC | PRN
  Filled 2014-10-01: qty 237

## 2014-10-01 MED ORDER — ALTEPLASE 2 MG IJ SOLR
2.0000 mg | Freq: Once | INTRAMUSCULAR | Status: DC | PRN
  Filled 2014-10-01: qty 2

## 2014-10-01 MED ORDER — PENTAFLUOROPROP-TETRAFLUOROETH EX AERO: 1.0000 "application " | INHALATION_SPRAY | CUTANEOUS | Status: DC | PRN

## 2014-10-01 MED ORDER — LIDOCAINE HCL (PF) 1 % IJ SOLN: 5.0000 mL | INTRAMUSCULAR | Status: DC | PRN

## 2014-10-01 MED ORDER — DIAZEPAM 5 MG/ML IJ SOLN
INTRAMUSCULAR | Status: AC
  Filled 2014-10-01: qty 2

## 2014-10-01 NOTE — Progress Notes (Signed)
[name] PROGRESS NOTE  Tracy [name] [id number] DOB: August 30, 1985 DOA: 09/27/2014 PCP: [name]  Assessment/Plan: 1. Right lower extremity pain with possible recurrence of myositis with abscess and phlegmon; Orthopedics consulted and underwent ultrasound guided aspiration of the abscess and sent for cultures. Meanwhile patient was restarted on cefazolin and Dr Megan Salon assisting [place] with antibiotics.  Pain control and underwent  further debridement by orthopedics.   2. ESRD on hemodialysis - Tuesday Thursday and Saturday. Patient was recently started on dialysis last month. Consulted nephrology for dialysis.  3. Type 1 diabetes Mellitus: CBG (last 3)   Recent Labs  10/01/14 0025 10/01/14 0502 10/01/14 0757  GLUCAP 201* 136* 116*    Resume SSI. Cut down the lantus to 6 units.     Anemia: Anemia of chronic disease and anemia of blood loss:  Monitor hemoglobin and transfuse if hemoglobin is less than 7.    Hypertension: Controlled.    Hypothyroidism: Resume synthroid.    Constipation: Senna, colace and miralax ordered.    Code Status: full code.  Family Communication: familly at bedside Disposition Plan: pending.    Consultants:  Radiology  ID  Renal.  Procedures: US guided aspiration of 2 sites of pain/inflammation of RLE.  1 = medial thigh, where there is post-op chages/scar. 2 = lateral knee. Fluid collection  Debridement of the abscess by orthopedics on 8/19  Antibiotics:  cefazolin  HPI/Subjective: Pain is moderate, no nausea, or vomiting.   Objective: Filed Vitals:   10/01/14 1700  BP: 132/66  Pulse: 93  Temp:   Resp:     Intake/Output Summary (Last 24 hours) at 10/01/14 1754 Last data filed at 10/01/14 1400  Gross per 24 hour  Intake    690 ml  Output    440 ml  Net    250 ml   Filed Weights   09/29/14 1010 09/29/14 1500 10/01/14 0500  Weight: 62.2 kg (137 lb 2 oz) 61.2 kg (134 lb 14.7 oz) 59.6 kg  (131 lb 6.3 oz)    Exam:   General:  Alert in mild distress from pain .  Cardiovascular: s1s2  Respiratory: ctab  Abdomen: soft NT ND BS+  Musculoskeletal: RIGHT thigh swelling and tenderness.   Data Reviewed: Basic Metabolic Panel:  Recent Labs Lab 09/27/14 2025 09/28/14 0550 09/29/14 1057 09/30/14 0347 10/01/14 0409  NA 137 139 138 134* 136  K 3.5 3.3* 3.5 3.9 4.5  CL 97* 102 99* 97* 100*  CO2 27 28 26 26  20*  GLUCOSE 212* 103* 88 118* 119*  BUN 13 17 23* 10 23*  CREATININE 3.31* 3.94* 5.29* 3.20* 4.68*  CALCIUM 9.5 9.1 9.2 9.2 9.0  PHOS  --   --  4.2  --   --    Liver Function Tests:  Recent Labs Lab 09/27/14 2025 09/28/14 0550 09/29/14 1057  AST 24 18  --   ALT 6* <5*  --   ALKPHOS 97 78  --   BILITOT 0.5 0.2*  --   PROT 8.1 6.6  --   ALBUMIN 3.4* 2.7* 2.6*   No results for input(s): LIPASE, AMYLASE in the last 168 hours. No results for input(s): AMMONIA in the last 168 hours. CBC:  Recent Labs Lab 09/27/14 2025 09/28/14 0550 09/29/14 1057 10/01/14 1619  WBC 8.7 5.7 10.3 6.8  NEUTROABS 6.9 3.5 8.5* 4.7  HGB 10.3* 9.1* 9.1* 7.9*  HCT 31.8* 28.7* 27.9* 24.5*  MCV 88.6 89.1 88.6 88.1  PLT 299 222 250 245  Cardiac Enzymes: No results for input(s): CKTOTAL, CKMB, CKMBINDEX, TROPONINI in the last 168 hours. BNP (last 3 results) No results for input(s): BNP in the last 8760 hours.  ProBNP (last 3 results) No results for input(s): PROBNP in the last 8760 hours.  CBG:  Recent Labs Lab 09/30/14 1915 09/30/14 2129 10/01/14 0025 10/01/14 0502 10/01/14 0757  GLUCAP 108* 184* 201* 136* 116*    Recent Results (from the past 240 hour(s))  Surgical pcr screen     Status: None   Collection Time: 09/28/14  1:31 AM  Result Value Ref Range Status   MRSA, PCR NEGATIVE NEGATIVE Final   Staphylococcus aureus NEGATIVE NEGATIVE Final    Comment:        The Xpert SA Assay (FDA approved for NASAL specimens in patients over 72 years of age), is  one component of a comprehensive surveillance program.  Test performance has been validated by [facility] for patients greater than or equal to 79 year old. It is not intended to diagnose infection nor to guide or monitor treatment.   Culture, routine-abscess     Status: None (Preliminary result)   Collection Time: 09/28/14  5:30 PM  Result Value Ref Range Status   Specimen Description ABSCESS RIGHT KNEE  Final   Special Requests NO2  Final   Gram Stain   Final    RARE WBC PRESENT, PREDOMINANTLY PMN NO SQUAMOUS EPITHELIAL CELLS SEEN NO ORGANISMS SEEN Performed at [facility]    Culture   Final    NO GROWTH 2 DAYS Performed at [facility]    Report Status PENDING  Incomplete  Culture, routine-abscess     Status: None (Preliminary result)   Collection Time: 09/28/14  5:30 PM  Result Value Ref Range Status   Specimen Description ABSCESS LEFT THIGH  Final   Special Requests NO1  Final   Gram Stain   Final    RARE WBC PRESENT, PREDOMINANTLY PMN NO SQUAMOUS EPITHELIAL CELLS SEEN NO ORGANISMS SEEN Performed at [facility]    Culture   Final    NO GROWTH 2 DAYS Performed at [facility]    Report Status PENDING  Incomplete  Anaerobic culture     Status: None (Preliminary result)   Collection Time: 09/30/14  5:48 PM  Result Value Ref Range Status   Specimen Description SYNOVIAL RIGHT KNEE  Final   Special Requests NONE  Final   Gram Stain   Final    NO ANAEROBES ISOLATED; CULTURE IN PROGRESS FOR 5 DAYS   Culture PENDING  Incomplete   Report Status PENDING  Incomplete  Body fluid culture     Status: None (Preliminary result)   Collection Time: 09/30/14  5:48 PM  Result Value Ref Range Status   Specimen Description SYNOVIAL RIGHT KNEE  Final   Special Requests NONE  Final   Gram Stain   Final    CYTOSPIN WBC PRESENT,BOTH PMN AND MONONUCLEAR NO ORGANISMS SEEN    Culture NO GROWTH < 24 HOURS  Final   Report Status PENDING  Incomplete   Tissue culture     Status: None (Preliminary result)   Collection Time: 09/30/14  6:12 PM  Result Value Ref Range Status   Specimen Description TISSUE RIGHT THIGH  Final   Special Requests NONE  Final   Gram Stain   Final    ABUNDANT WBC PRESENT,BOTH PMN AND MONONUCLEAR NO ORGANISMS SEEN Performed at [facility] Performed at [facility]  Culture PENDING  Incomplete   Report Status PENDING  Incomplete  Gram stain     Status: None   Collection Time: 09/30/14  6:12 PM  Result Value Ref Range Status   Specimen Description TISSUE RIGHT THIGH  Final   Special Requests NONE  Final   Gram Stain   Final    ABUNDANT WBC PRESENT,BOTH PMN AND MONONUCLEAR NO ORGANISMS SEEN    Report Status 09/30/2014 FINAL  Final     Studies: No results found.  Scheduled Meds: . amLODipine  10 mg Oral QHS  .  ceFAZolin (ANCEF) IV  2 g Intravenous Q T,Th,Sat-1800  . darbepoetin (ARANESP) injection - DIALYSIS  100 mcg Intravenous Q Thu-HD  . enoxaparin (LOVENOX) injection  30 mg Subcutaneous Q24H  . ferric gluconate (FERRLECIT/NULECIT) IV  125 mg Intravenous Q Thu-HD  . insulin aspart  0-9 Units Subcutaneous Q4H  . insulin glargine  6 Units Subcutaneous QHS  . levothyroxine  75 mcg Oral QAC breakfast  . multivitamin  1 tablet Oral QHS  . OxyCODONE  20 mg Oral Q12H  . polyethylene glycol  17 g Oral Daily  . senna-docusate  2 tablet Oral BID  . sevelamer carbonate  800 mg Oral TID WC  . sodium chloride  500 mL Intravenous Once  . sodium chloride  3 mL Intravenous Q12H   Continuous Infusions: . sodium chloride      Principal Problem:   Abscess of right thigh Active Problems:   Hypothyroidism   Myositis   DM I (diabetes mellitus, type I), uncontrolled   Essential hypertension   Chronic anemia   Lower extremity pain, right    Time spent: 25 minutes    [place] [facility] Pager [phone]. If 7PM-7AM, please contact night-coverage at [email or web address], password  [facility] 10/01/2014, 5:54 PM  LOS: 4 days

## 2014-10-01 NOTE — Progress Notes (Signed)
Patient ID: CHEETARA JOLLIFFE, female   DOB: [birth year], 29 y.o.   MRN: [id number] Patient seen at bedside for follow-up as Dr. Percell Miller inform me of her hospitalization  Her right arm looks good. There is no comp cutting features. I came by to check the arm and make sure there are no issues. She of course has undergone I and D of the arm with similar problems as she is having in the thigh. At present the arm is normal to exam and is healed nicely.  Please call for any problems arise.  Kristeen Lantz M.D.

## 2014-10-01 NOTE — Progress Notes (Signed)
Patient ID: Tracy [name], female   DOB: 23-Feb-1985, 29 y.o.   MRN: [id number]         [facility]    Date of Admission:  09/27/2014   Total days of antibiotics 8         Principal Problem:   Abscess of right thigh Active Problems:   Myositis   Hypothyroidism   DM I (diabetes mellitus, type I), uncontrolled   Essential hypertension   Chronic anemia   Lower extremity pain, right   . amLODipine  10 mg Oral QHS  .  ceFAZolin (ANCEF) IV  2 g Intravenous Q T,Th,Sat-1800  . darbepoetin (ARANESP) injection - DIALYSIS  100 mcg Intravenous Q Thu-HD  . enoxaparin (LOVENOX) injection  30 mg Subcutaneous Q24H  . ferric gluconate (FERRLECIT/NULECIT) IV  125 mg Intravenous Q Thu-HD  . insulin aspart  0-9 Units Subcutaneous Q4H  . insulin glargine  6 Units Subcutaneous QHS  . levothyroxine  75 mcg Oral QAC breakfast  . multivitamin  1 tablet Oral QHS  . OxyCODONE  20 mg Oral Q12H  . polyethylene glycol  17 g Oral Daily  . senna-docusate  2 tablet Oral BID  . sevelamer carbonate  800 mg Oral TID WC  . sodium chloride  500 mL Intravenous Once  . sodium chloride  3 mL Intravenous Q12H   Review of Systems: Review of systems not obtained due to patient factors.  Past Medical History  Diagnosis Date  . Thyroid enlargement     "not on medication at this time"  . Urinary tract infection     hx of  . Anemia     presently on iron supplement  . Diabetes mellitus     insulin pump   . Hypothyroidism   . Anxiety   . HSV-2 (herpes simplex virus 2) infection   . HSV-1 (herpes simplex virus 1) infection   . Detached retina   . Yeast infection     took diflucan saturday   . Hypertension   . Irregular periods 07/05/2014  . Vaginal discharge 07/05/2014  . Abscess of right thigh     Social History  Substance Use Topics  . Smoking status: Never Smoker   . Smokeless tobacco: Never Used  . Alcohol Use: No    Family History  Problem Relation Age of Onset  .  Cancer Paternal Grandfather     prostate  . Hyperlipidemia Paternal Grandfather   . Stroke Paternal Grandfather    Allergies  Allergen Reactions  . Penicillins Hives and Swelling  . Sulfa Antibiotics Hives    OBJECTIVE: Filed Vitals:   09/30/14 2036 10/01/14 0027 10/01/14 0456 10/01/14 0500  BP: 157/72 154/61 156/54   Pulse: 103 96 90   Temp: 99.4 F (37.4 C) 99.7 F (37.6 C) 98.7 F (37.1 C)   TempSrc: Oral Oral Oral   Resp: 15 17 16    Height:      Weight:    131 lb 6.3 oz (59.6 kg)  SpO2: 100% 100% 98%    Body mass index is 23.28 kg/(m^2).  General: she is sound asleep. Family are present  Lab Results Lab Results  Component Value Date   WBC 10.3 09/29/2014   HGB 9.1* 09/29/2014   HCT 27.9* 09/29/2014   MCV 88.6 09/29/2014   PLT 250 09/29/2014    Lab Results  Component Value Date   CREATININE 4.68* 10/01/2014   BUN 23* 10/01/2014   NA 136 10/01/2014  K 4.5 10/01/2014   CL 100* 10/01/2014   CO2 20* 10/01/2014    Lab Results  Component Value Date   ALT <5* 09/28/2014   AST 18 09/28/2014   ALKPHOS 78 09/28/2014   BILITOT 0.2* 09/28/2014     Microbiology: Recent Results (from the past 240 hour(s))  Surgical pcr screen     Status: None   Collection Time: 09/28/14  1:31 AM  Result Value Ref Range Status   MRSA, PCR NEGATIVE NEGATIVE Final   Staphylococcus aureus NEGATIVE NEGATIVE Final    Comment:        The Xpert SA Assay (FDA approved for NASAL specimens in patients over 83 years of age), is one component of a comprehensive surveillance program.  Test performance has been validated by [facility] for patients greater than or equal to 70 year old. It is not intended to diagnose infection nor to guide or monitor treatment.   Culture, routine-abscess     Status: None (Preliminary result)   Collection Time: 09/28/14  5:30 PM  Result Value Ref Range Status   Specimen Description ABSCESS RIGHT KNEE  Final   Special Requests NO2  Final   Gram  Stain   Final    RARE WBC PRESENT, PREDOMINANTLY PMN NO SQUAMOUS EPITHELIAL CELLS SEEN NO ORGANISMS SEEN Performed at [facility]    Culture   Final    NO GROWTH 2 DAYS Performed at [facility]    Report Status PENDING  Incomplete  Culture, routine-abscess     Status: None (Preliminary result)   Collection Time: 09/28/14  5:30 PM  Result Value Ref Range Status   Specimen Description ABSCESS LEFT THIGH  Final   Special Requests NO1  Final   Gram Stain   Final    RARE WBC PRESENT, PREDOMINANTLY PMN NO SQUAMOUS EPITHELIAL CELLS SEEN NO ORGANISMS SEEN Performed at [facility]    Culture   Final    NO GROWTH 2 DAYS Performed at [facility]    Report Status PENDING  Incomplete  Anaerobic culture     Status: None (Preliminary result)   Collection Time: 09/30/14  5:48 PM  Result Value Ref Range Status   Specimen Description SYNOVIAL RIGHT KNEE  Final   Special Requests NONE  Final   Gram Stain   Final    NO ANAEROBES ISOLATED; CULTURE IN PROGRESS FOR 5 DAYS   Culture PENDING  Incomplete   Report Status PENDING  Incomplete  Body fluid culture     Status: None (Preliminary result)   Collection Time: 09/30/14  5:48 PM  Result Value Ref Range Status   Specimen Description SYNOVIAL RIGHT KNEE  Final   Special Requests NONE  Final   Gram Stain   Final    CYTOSPIN WBC PRESENT,BOTH PMN AND MONONUCLEAR NO ORGANISMS SEEN    Culture NO GROWTH < 24 HOURS  Final   Report Status PENDING  Incomplete  Tissue culture     Status: None (Preliminary result)   Collection Time: 09/30/14  6:12 PM  Result Value Ref Range Status   Specimen Description TISSUE RIGHT THIGH  Final   Special Requests NONE  Final   Gram Stain   Final    ABUNDANT WBC PRESENT,BOTH PMN AND MONONUCLEAR NO ORGANISMS SEEN Performed at [facility] Performed at [facility]    Culture PENDING  Incomplete   Report Status PENDING  Incomplete  Gram stain     Status: None  Collection Time: 09/30/14  6:12 PM  Result Value Ref Range Status   Specimen Description TISSUE RIGHT THIGH  Final   Special Requests NONE  Final   Gram Stain   Final    ABUNDANT WBC PRESENT,BOTH PMN AND MONONUCLEAR NO ORGANISMS SEEN    Report Status 09/30/2014 FINAL  Final     ASSESSMENT: A persistent right thigh abscess was incised and drained last night. Fortunately there did not appear to be any communication with her right knee. Operative Gram stain is negative. Cultures of the aspirate done 3 days ago and operative cultures remain negative. I suspect that this is persistence of her previous MSSA abscess and will continue cefazolin.  PLAN: 1. Continue cefazolin  Michel Bickers, MD [facility] [phone] pager   [phone] cell 10/01/2014, 2:51 PM

## 2014-10-01 NOTE — Progress Notes (Signed)
Subjective: Interval History: has complaints of diet issues, needs to talk with dietician.  Objective: Vital signs in last 24 hours: Temp:  [97.1 F (36.2 C)-99.7 F (37.6 C)] 98.7 F (37.1 C) (08/20 0456) Pulse Rate:  [82-103] 90 (08/20 0456) Resp:  [14-30] 16 (08/20 0456) BP: (126-194)/(54-85) 156/54 mmHg (08/20 0456) SpO2:  [98 %-100 %] 98 % (08/20 0456) Weight:  [59.6 kg (131 lb 6.3 oz)] 59.6 kg (131 lb 6.3 oz) (08/20 0500) Weight change: -2.6 kg (-5 lb 11.7 oz)  Intake/Output from previous day: 08/19 0701 - 08/20 0700 In: 450 [I.V.:450] Out: 400 [Urine:400] Intake/Output this shift:    General appearance: alert, cooperative and no distress Resp: clear to auscultation bilaterally Chest wall: RIJ cath Cardio: S1, S2 normal and systolic murmur: holosystolic 2/6, blowing at apex GI: pos bs, soft, liver down 5 cm Extremities: avf LUA B&T, dressing R thigh  Lab Results:  Recent Labs  09/29/14 1057  WBC 10.3  HGB 9.1*  HCT 27.9*  PLT 250   BMET:  Recent Labs  09/30/14 0347 10/01/14 0409  NA 134* 136  K 3.9 4.5  CL 97* 100*  CO2 26 20*  GLUCOSE 118* 119*  BUN 10 23*  CREATININE 3.20* 4.68*  CALCIUM 9.2 9.0   No results for input(s): PTH in the last 72 hours. Iron Studies: No results for input(s): IRON, TIBC, TRANSFERRIN, FERRITIN in the last 72 hours.  Studies/Results: No results found.  I have reviewed the patient's current medications.  Assessment/Plan: 1 ESRD for HD 2 HTN lower vol 3 DM per primary 4 Anemia eval today 5 HPTH meds 6 Pyomyositis drained, and debrided, on AB, drain P HD, AB, wound care    LOS: 4 days   Tracy [name] L 10/01/2014,9:31 AM

## 2014-10-01 NOTE — Evaluation (Addendum)
Occupational Therapy Evaluation Patient Details Name: Tracy [name] MRN: [id number] DOB: 26-Feb-1985 Today's Date: 10/01/2014    History of Present Illness I and D  of the right lower extremity, with placement of wound VAC    Clinical Impression   Pt with decline in function and safety with ADLs and ADL mobility with decreased balance, endurance and R LE pain significantly limiting function. Pt would benefit from acute OT services to address impairments to increase level of function and safety to return home    Follow Up Recommendations  No OT follow up;Supervision/Assistance - 24 hour    Equipment Recommendations  Other (comment) (TBD)    Recommendations for Other Services       Precautions / Restrictions  R LE wound vac R LE WBAT     Mobility Bed Mobility Overal bed mobility: Needs Assistance Bed Mobility: Supine to Sit;Sit to Supine     Supine to sit: Mod assist Sit to supine: Mod assist   General bed mobility comments: mod A with R LE off and back onto bed  Transfers Overall transfer level: Needs assistance Equipment used: Rolling walker (2 wheeled) Transfers: Sit to/from Stand Sit to Stand: Min assist  Pt stood at EOB with RW x 1:37              Balance Overall balance assessment: Needs assistance Sitting-balance support: Feet supported;No upper extremity supported Sitting balance-Leahy Scale: Good     Standing balance support: Bilateral upper extremity supported;During functional activity Standing balance-Leahy Scale: Poor                              ADL Overall ADL's : Needs assistance/impaired     Grooming: Wash/dry hands;Wash/dry face;Sitting;Set up   Upper Body Bathing: Set up;Sitting   Lower Body Bathing: Moderate assistance   Upper Body Dressing : Set up   Lower Body Dressing: Maximal assistance (wound vac, R LE pain) Lower Body Dressing Details (indicate cue type and reason): pt completed donning of underwear at  bed level by bridging hips to pull up   Toilet Transfer Details (indicate cue type and reason): pt declined transfers to [place], agreebale to sit - stand to RW from [place] and Hygiene: Sit to/from stand;Total assistance       Functional mobility during ADLs: Minimal assistance General ADL Comments: pt sat EOB x 7.5 minutes before requseintg to return to supine due to R LE pain     Vision     Perception Perception Perception Tested?: No   Praxis Praxis Praxis tested?: Not tested    Pertinent Vitals/Pain Pain Assessment: 0-10 Pain Score: 9  Pain Location: R LE Pain Descriptors / Indicators: Heaviness Pain Intervention(s): Limited activity within patient's tolerance;Monitored during session;Premedicated before session;Repositioned     Hand Dominance Right   Extremity/Trunk Assessment Upper Extremity Assessment Upper Extremity Assessment: Overall WFL for tasks assessed   Lower Extremity Assessment Lower Extremity Assessment: Defer to PT evaluation   Cervical / Trunk Assessment Cervical / Trunk Assessment: Normal   Communication Communication Communication: No difficulties   Cognition Arousal/Alertness: Awake/alert Behavior During Therapy: WFL for tasks assessed/performed Overall Cognitive Status: Within Functional Limits for tasks assessed                     General Comments   Pt pleasant and cooperative, anxious about mobility. 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 [employer]    OT Diagnosis: Acute pain   OT Problem List: Pain;Decreased activity tolerance;Impaired balance (sitting and/or standing)   OT Treatment/Interventions: Self-care/ADL training;Patient/family education;Therapeutic activities;DME and/or AE instruction    OT Goals(Current goals can be found in the care plan section) Acute Rehab OT Goals Patient Stated Goal: get better and go home OT Goal Formulation: With patient/family Time For Goal Achievement: 10/08/14 Potential to Achieve Goals: Good ADL Goals Pt Will Perform Grooming: with caregiver independent in assisting;standing;with mod assist Pt Will Perform Lower Body Bathing: with mod assist;with caregiver independent in assisting;sitting/lateral leans;sit to/from stand Pt Will Perform Lower Body Dressing: with mod assist;with caregiver independent in assisting;sitting/lateral leans;sit to/from stand Pt Will Transfer to Toilet: with min guard assist;bedside commode Pt Will Perform Toileting - Clothing Manipulation and hygiene: with max assist;with mod assist;sitting/lateral leans;sit to/from stand Additional ADL Goal #1: Pt will sit EOB x 10 minutes for UB ADLs  OT Frequency: Min 2X/week   Barriers to D/C:    none                     End of Session Equipment Utilized During Treatment: Rolling walker  Activity Tolerance: Patient limited by pain Patient left: in bed;with call bell/phone within reach;with family/visitor present   Time: [id number] OT Time Calculation (min): 33 min Charges:  OT General Charges $OT Visit: 1 Procedure OT Evaluation $Initial OT Evaluation Tier I: 1 Procedure OT Treatments $Self Care/Home Management : 8-22 mins $Therapeutic Activity: 8-22 mins G-Codes:    Britt Bottom 10/01/2014, 11:15 AM

## 2014-10-01 NOTE — Progress Notes (Signed)
Patient ID: Tracy [name], female   DOB: [birth year], 29 y.o.   MRN: [id number]     Subjective:  Patient reports pain as mild to moderate.  Patient alert and follows commands she is worried about further infection.  Objective:   VITALS:   Filed Vitals:   09/30/14 2036 10/01/14 0027 10/01/14 0456 10/01/14 0500  BP: 157/72 154/61 156/54   Pulse: 103 96 90   Temp: 99.4 F (37.4 C) 99.7 F (37.6 C) 98.7 F (37.1 C)   TempSrc: Oral Oral Oral   Resp: 15 17 16    Height:      Weight:    59.6 kg (131 lb 6.3 oz)  SpO2: 100% 100% 98%     ABD soft Sensation intact distally Dorsiflexion/Plantar flexion intact Incision: dressing C/D/I and moderate drainage Wound vac in place and active   Lab Results  Component Value Date   WBC 10.3 09/29/2014   HGB 9.1* 09/29/2014   HCT 27.9* 09/29/2014   MCV 88.6 09/29/2014   PLT 250 09/29/2014   BMET    Component Value Date/Time   NA 136 10/01/2014 0409   NA 133* 01/18/2013 1106   K 4.5 10/01/2014 0409   CL 100* 10/01/2014 0409   CO2 20* 10/01/2014 0409   GLUCOSE 119* 10/01/2014 0409   GLUCOSE 929* 01/18/2013 1106   BUN 23* 10/01/2014 0409   BUN 37* 01/18/2013 1106   CREATININE 4.68* 10/01/2014 0409   CALCIUM 9.0 10/01/2014 0409   GFRNONAA 12* 10/01/2014 0409   GFRAA 13* 10/01/2014 0409     Assessment/Plan: 1 Day Post-Op   Principal Problem:   Abscess of right thigh Active Problems:   Hypothyroidism   Myositis   DM I (diabetes mellitus, type I), uncontrolled   Essential hypertension   Chronic anemia   Lower extremity pain, right   Advance diet Up with therapy Continue wound vac Continue dry dressing at the distal incisions Patient to have follow I&D on Mon by Dr Ranelle Oyster, [place] 10/01/2014, 2:38 PM  Discussed and agree with above.  Knee tap was clean.  Plan repeat I&D with Dr. Alain Marion Monday.    [name], MD Cell [phone]

## 2014-10-01 NOTE — Progress Notes (Signed)
PT Cancellation Note  Patient Details Name: Tracy [name] MRN: [id number] DOB: Apr 09, 1985   Cancelled Treatment:    Reason Eval/Treat Not Completed: Other (comment) (family in and declines PT due to pt being in pain) and just getting to sleep.  Will try back as time allows.   Tracy [name] 10/01/2014, 11:17 AM   Mee Hives, PT MS Acute Rehab Dept. Number: [phone] [id number] and [place] [phone]

## 2014-10-02 ENCOUNTER — Encounter (HOSPITAL_COMMUNITY): Payer: Self-pay | Admitting: Orthopedic Surgery

## 2014-10-02 LAB — CULTURE, ROUTINE-ABSCESS
CULTURE: NO GROWTH
Culture: NO GROWTH

## 2014-10-02 LAB — GLUCOSE, CAPILLARY
GLUCOSE-CAPILLARY: 354 mg/dL — AB (ref 65–99)
Glucose-Capillary: 133 mg/dL — ABNORMAL HIGH (ref 65–99)
Glucose-Capillary: 156 mg/dL — ABNORMAL HIGH (ref 65–99)
Glucose-Capillary: 197 mg/dL — ABNORMAL HIGH (ref 65–99)
Glucose-Capillary: 281 mg/dL — ABNORMAL HIGH (ref 65–99)

## 2014-10-02 MED ORDER — DIAZEPAM 5 MG PO TABS
ORAL_TABLET | ORAL | Status: AC
  Administered 2014-10-02: 5 mg
  Filled 2014-10-02: qty 1

## 2014-10-02 MED ORDER — FLEET ENEMA 7-19 GM/118ML RE ENEM
1.0000 | ENEMA | Freq: Every day | RECTAL | Status: DC | PRN
  Administered 2014-10-02: 1 via RECTAL
  Filled 2014-10-02: qty 1

## 2014-10-02 NOTE — Progress Notes (Signed)
[name] PROGRESS NOTE  Tracy [name] [id number] DOB: [birth year] DOA: 09/27/2014 PCP: [name] Interim summary: 29 y.o. female who was recently admitted last month for myositis an MSSA bacteremia presents with recurrent right lower extremity pain and tenderness. Orthopedics and ID consulted.  Assessment/Plan: 1. Right lower extremity pain with possible recurrence of myositis with abscess and phlegmon; Orthopedics consulted and underwent ultrasound guided aspiration of the abscess and sent for cultures. Meanwhile patient was restarted on cefazolin and Dr Megan Salon assisting [place] with antibiotics. So far, blood cultures have been negative .  Pain control and  She underwent  further debridement by orthopedics on 8/19. Wound vac was placed.  Repeat I&D tomorrow by Dr Percell Miller.    2. ESRD on hemodialysis - Tuesday Thursday and Saturday. Patient was recently started on dialysis last month. Consulted nephrology for dialysis.  3. Type 1 diabetes Mellitus: CBG (last 3)   Recent Labs  10/02/14 0453 10/02/14 1201 10/02/14 1647  GLUCAP 156* 133* 281*    Resume SSI. Decreased the dose of lantus to 6 units.     Anemia: Anemia of chronic disease and anemia of blood loss:  Monitor hemoglobin and transfuse if hemoglobin is less than 7.    Hypertension: Controlled.    Hypothyroidism: Resume synthroid.    Constipation: Senna, colace and miralax ordered.    Code Status: full code.  Family Communication: familly at bedside Disposition Plan: pending.    Consultants:  Radiology  ID  Renal.  Procedures: US guided aspiration of 2 sites of pain/inflammation of RLE.  1 = medial thigh, where there is post-op chages/scar. 2 = lateral knee. Fluid collection  Debridement of the abscess by orthopedics on 8/19  Antibiotics:  cefazolin  HPI/Subjective: Pain is moderate, no nausea, or vomiting.   Objective: Filed Vitals:   10/01/14 2117  BP: 124/64   Pulse: 91  Temp: 99.6 F (37.6 C)  Resp: 16    Intake/Output Summary (Last 24 hours) at 10/02/14 1650 Last data filed at 10/01/14 1930  Gross per 24 hour  Intake      0 ml  Output   1900 ml  Net  -1900 ml   Filed Weights   09/29/14 1500 10/01/14 0500 10/01/14 1940  Weight: 61.2 kg (134 lb 14.7 oz) 59.6 kg (131 lb 6.3 oz) 59.3 kg (130 lb 11.7 oz)    Exam:   General:  Alert in mild distress from pain .  Cardiovascular: s1s2  Respiratory: ctab  Abdomen: soft NT ND BS+  Musculoskeletal: RIGHT thigh swelling and tenderness.   Data Reviewed: Basic Metabolic Panel:  Recent Labs Lab 09/28/14 0550 09/29/14 1057 09/30/14 0347 10/01/14 0409 10/01/14 2238  NA 139 138 134* 136 136  K 3.3* 3.5 3.9 4.5 3.4*  CL 102 99* 97* 100* 98*  CO2 28 26 26  20* 21*  GLUCOSE 103* 88 118* 119* 301*  BUN 17 23* 10 23* 9  CREATININE 3.94* 5.29* 3.20* 4.68* 2.24*  CALCIUM 9.1 9.2 9.2 9.0 8.2*  PHOS  --  4.2  --   --  1.9*   Liver Function Tests:  Recent Labs Lab 09/27/14 2025 09/28/14 0550 09/29/14 1057 10/01/14 2238  AST 24 18  --   --   ALT 6* <5*  --   --   ALKPHOS 97 78  --   --   BILITOT 0.5 0.2*  --   --   PROT 8.1 6.6  --   --   ALBUMIN 3.4* 2.7* 2.6* 2.5*  No results for input(s): LIPASE, AMYLASE in the last 168 hours. No results for input(s): AMMONIA in the last 168 hours. CBC:  Recent Labs Lab 09/27/14 2025 09/28/14 0550 09/29/14 1057 10/01/14 1619 10/01/14 2238  WBC 8.7 5.7 10.3 6.8 6.5  NEUTROABS 6.9 3.5 8.5* 4.7  --   HGB 10.3* 9.1* 9.1* 7.9* 9.1*  HCT 31.8* 28.7* 27.9* 24.5* 27.6*  MCV 88.6 89.1 88.6 88.1 88.5  PLT 299 222 250 245 209   Cardiac Enzymes: No results for input(s): CKTOTAL, CKMB, CKMBINDEX, TROPONINI in the last 168 hours. BNP (last 3 results) No results for input(s): BNP in the last 8760 hours.  ProBNP (last 3 results) No results for input(s): PROBNP in the last 8760 hours.  CBG:  Recent Labs Lab 10/01/14 2117  10/02/14 0025 10/02/14 0453 10/02/14 1201 10/02/14 1647  GLUCAP 241* 197* 156* 133* 281*    Recent Results (from the past 240 hour(s))  Surgical pcr screen     Status: None   Collection Time: 09/28/14  1:31 AM  Result Value Ref Range Status   MRSA, PCR NEGATIVE NEGATIVE Final   Staphylococcus aureus NEGATIVE NEGATIVE Final    Comment:        The Xpert SA Assay (FDA approved for NASAL specimens in patients over 31 years of age), is one component of a comprehensive surveillance program.  Test performance has been validated by [facility] for patients greater than or equal to 71 year old. It is not intended to diagnose infection nor to guide or monitor treatment.   Culture, routine-abscess     Status: None   Collection Time: 09/28/14  5:30 PM  Result Value Ref Range Status   Specimen Description ABSCESS RIGHT KNEE  Final   Special Requests NO2  Final   Gram Stain   Final    RARE WBC PRESENT, PREDOMINANTLY PMN NO SQUAMOUS EPITHELIAL CELLS SEEN NO ORGANISMS SEEN Performed at [facility]    Culture   Final    NO GROWTH 3 DAYS Performed at [facility]    Report Status 10/02/2014 FINAL  Final  Culture, routine-abscess     Status: None   Collection Time: 09/28/14  5:30 PM  Result Value Ref Range Status   Specimen Description ABSCESS LEFT THIGH  Final   Special Requests NO1  Final   Gram Stain   Final    RARE WBC PRESENT, PREDOMINANTLY PMN NO SQUAMOUS EPITHELIAL CELLS SEEN NO ORGANISMS SEEN Performed at [facility]    Culture   Final    NO GROWTH 3 DAYS Performed at [facility]    Report Status 10/02/2014 FINAL  Final  Anaerobic culture     Status: None (Preliminary result)   Collection Time: 09/30/14  5:48 PM  Result Value Ref Range Status   Specimen Description SYNOVIAL RIGHT KNEE  Final   Special Requests NONE  Final   Gram Stain   Final    WBC PRESENT,BOTH PMN AND MONONUCLEAR NO ORGANISMS SEEN CYTOSPIN SMEAR    Culture    Final    NO ANAEROBES ISOLATED; CULTURE IN PROGRESS FOR 5 DAYS   Report Status PENDING  Incomplete  Body fluid culture     Status: None (Preliminary result)   Collection Time: 09/30/14  5:48 PM  Result Value Ref Range Status   Specimen Description SYNOVIAL RIGHT KNEE  Final   Special Requests NONE  Final   Gram Stain   Final    CYTOSPIN WBC PRESENT,BOTH PMN  AND MONONUCLEAR NO ORGANISMS SEEN    Culture NO GROWTH 2 DAYS  Final   Report Status PENDING  Incomplete  Tissue culture     Status: None (Preliminary result)   Collection Time: 09/30/14  6:12 PM  Result Value Ref Range Status   Specimen Description TISSUE RIGHT THIGH  Final   Special Requests NONE  Final   Gram Stain   Final    ABUNDANT WBC PRESENT,BOTH PMN AND MONONUCLEAR NO ORGANISMS SEEN Performed at [facility] Performed at [facility]    Culture PENDING  Incomplete   Report Status PENDING  Incomplete  Gram stain     Status: None   Collection Time: 09/30/14  6:12 PM  Result Value Ref Range Status   Specimen Description TISSUE RIGHT THIGH  Final   Special Requests NONE  Final   Gram Stain   Final    ABUNDANT WBC PRESENT,BOTH PMN AND MONONUCLEAR NO ORGANISMS SEEN    Report Status 09/30/2014 FINAL  Final     Studies: No results found.  Scheduled Meds: . amLODipine  10 mg Oral QHS  .  ceFAZolin (ANCEF) IV  2 g Intravenous Q T,Th,Sat-1800  . darbepoetin (ARANESP) injection - DIALYSIS  100 mcg Intravenous Q Thu-HD  . enoxaparin (LOVENOX) injection  30 mg Subcutaneous Q24H  . ferric gluconate (FERRLECIT/NULECIT) IV  125 mg Intravenous Q Thu-HD  . insulin aspart  0-9 Units Subcutaneous Q4H  . insulin glargine  6 Units Subcutaneous QHS  . levothyroxine  75 mcg Oral QAC breakfast  . multivitamin  1 tablet Oral QHS  . OxyCODONE  20 mg Oral Q12H  . polyethylene glycol  17 g Oral Daily  . senna-docusate  2 tablet Oral BID  . sodium chloride  500 mL Intravenous Once  . sodium chloride  3 mL  Intravenous Q12H   Continuous Infusions: . sodium chloride      Principal Problem:   Abscess of right thigh Active Problems:   Hypothyroidism   Myositis   DM I (diabetes mellitus, type I), uncontrolled   Essential hypertension   Chronic anemia   Lower extremity pain, right    Time spent: 25 minutes    [place] [facility] Pager [phone]. If 7PM-7AM, please contact night-coverage at [email or web address], password [facility] 10/02/2014, 4:50 PM  LOS: 5 days

## 2014-10-02 NOTE — Progress Notes (Signed)
ANTIBIOTIC CONSULT NOTE - FOLLOW UP  Pharmacy Consult for Cefazolin Indication: RLE abscess/cellulitis  Allergies  Allergen Reactions  . Penicillins Hives and Swelling  . Sulfa Antibiotics Hives    Patient Measurements: Height: 5\' 3"  (160 cm) Weight: 130 lb 11.7 oz (59.3 kg) IBW/kg (Calculated) : 52.4  Vital Signs: Temp: 99.6 F (37.6 C) (08/20 2117) Temp Source: Oral (08/20 2117) BP: 124/64 mmHg (08/20 2117) Pulse Rate: 91 (08/20 2117) Intake/Output from previous day: 08/20 0701 - 08/21 0700 In: 240 [P.O.:240] Out: 1940 [Drains:40] Intake/Output from this shift:    Labs:  Recent Labs  09/29/14 1057 09/30/14 0347 10/01/14 0409 10/01/14 1619 10/01/14 2238  WBC 10.3  --   --  6.8 6.5  HGB 9.1*  --   --  7.9* 9.1*  PLT 250  --   --  245 209  CREATININE 5.29* 3.20* 4.68*  --  2.24*   Estimated Creatinine Clearance: 30.4 mL/min (by C-G formula based on Cr of 2.24). No results for input(s): VANCOTROUGH, VANCOPEAK, VANCORANDOM, GENTTROUGH, GENTPEAK, GENTRANDOM, TOBRATROUGH, TOBRAPEAK, TOBRARND, AMIKACINPEAK, AMIKACINTROU, AMIKACIN in the last 72 hours.   Microbiology: Recent Results (from the past 720 hour(s))  Surgical pcr screen     Status: None   Collection Time: 09/28/14  1:31 AM  Result Value Ref Range Status   MRSA, PCR NEGATIVE NEGATIVE Final   Staphylococcus aureus NEGATIVE NEGATIVE Final    Comment:        The Xpert SA Assay (FDA approved for NASAL specimens in patients over 30 years of age), is one component of a comprehensive surveillance program.  Test performance has been validated by [facility] for patients greater than or equal to 30 year old. It is not intended to diagnose infection nor to guide or monitor treatment.   Culture, routine-abscess     Status: None (Preliminary result)   Collection Time: 09/28/14  5:30 PM  Result Value Ref Range Status   Specimen Description ABSCESS RIGHT KNEE  Final   Special Requests NO2  Final   Gram  Stain   Final    RARE WBC PRESENT, PREDOMINANTLY PMN NO SQUAMOUS EPITHELIAL CELLS SEEN NO ORGANISMS SEEN Performed at [facility]    Culture   Final    NO GROWTH 2 DAYS Performed at [facility]    Report Status PENDING  Incomplete  Culture, routine-abscess     Status: None (Preliminary result)   Collection Time: 09/28/14  5:30 PM  Result Value Ref Range Status   Specimen Description ABSCESS LEFT THIGH  Final   Special Requests NO1  Final   Gram Stain   Final    RARE WBC PRESENT, PREDOMINANTLY PMN NO SQUAMOUS EPITHELIAL CELLS SEEN NO ORGANISMS SEEN Performed at [facility]    Culture   Final    NO GROWTH 2 DAYS Performed at [facility]    Report Status PENDING  Incomplete  Anaerobic culture     Status: None (Preliminary result)   Collection Time: 09/30/14  5:48 PM  Result Value Ref Range Status   Specimen Description SYNOVIAL RIGHT KNEE  Final   Special Requests NONE  Final   Gram Stain   Final    NO ANAEROBES ISOLATED; CULTURE IN PROGRESS FOR 5 DAYS   Culture PENDING  Incomplete   Report Status PENDING  Incomplete  Body fluid culture     Status: None (Preliminary result)   Collection Time: 09/30/14  5:48 PM  Result Value Ref Range Status  Specimen Description SYNOVIAL RIGHT KNEE  Final   Special Requests NONE  Final   Gram Stain   Final    CYTOSPIN WBC PRESENT,BOTH PMN AND MONONUCLEAR NO ORGANISMS SEEN    Culture NO GROWTH < 24 HOURS  Final   Report Status PENDING  Incomplete  Tissue culture     Status: None (Preliminary result)   Collection Time: 09/30/14  6:12 PM  Result Value Ref Range Status   Specimen Description TISSUE RIGHT THIGH  Final   Special Requests NONE  Final   Gram Stain   Final    ABUNDANT WBC PRESENT,BOTH PMN AND MONONUCLEAR NO ORGANISMS SEEN Performed at [facility] Performed at [facility]    Culture PENDING  Incomplete   Report Status PENDING  Incomplete  Gram stain     Status: None    Collection Time: 09/30/14  6:12 PM  Result Value Ref Range Status   Specimen Description TISSUE RIGHT THIGH  Final   Special Requests NONE  Final   Gram Stain   Final    ABUNDANT WBC PRESENT,BOTH PMN AND MONONUCLEAR NO ORGANISMS SEEN    Report Status 09/30/2014 FINAL  Final    Anti-infectives    Start     Dose/Rate Route Frequency Ordered Stop   10/01/14 1800  ceFAZolin (ANCEF) IVPB 2 g/50 mL premix     2 g 100 mL/hr over 30 Minutes Intravenous Every T-Th-Sa (1800) 09/29/14 1321     09/29/14 1800  ceFAZolin (ANCEF) IVPB 2 g/50 mL premix  Status:  Discontinued     2 g 100 mL/hr over 30 Minutes Intravenous Every T-Th-Sa (1800) 09/29/14 1318 09/29/14 1321   09/29/14 1319  ceFAZolin (ANCEF) IVPB 1 g/50 mL premix     1 g 100 mL/hr over 30 Minutes Intravenous Every T-Th-Sa (1800) 09/29/14 1321 09/29/14 1506   09/28/14 1300  ceFAZolin (ANCEF) IVPB 2 g/50 mL premix  Status:  Discontinued     2 g 100 mL/hr over 30 Minutes Intravenous Every 12 hours 09/28/14 0035 09/29/14 1318      Assessment: 29 YOF who continues on Ancef for RLE abscess/cellulitis concerning for MSSA. I&D scheduled for tomorrow 8/22. The patient is noted to be ESRD receiving HD on T/Th/Sat.  Goal of Therapy:  Proper antibiotics for infection/cultures adjusted for renal/hepatic function   Plan:  - Cefazolin 2g post HD-T/Th/Sat - Will continue to follow HD schedule/duration, culture results, LOT, and antibiotic de-escalation plans   Dimitri Ped, PharmD. Clinical Pharmacist Resident Pager: [phone]

## 2014-10-02 NOTE — Progress Notes (Signed)
Subjective: Interval History: has complaints constip.  Objective: Vital signs in last 24 hours: Temp:  [98 F (36.7 C)-99.6 F (37.6 C)] 99.6 F (37.6 C) (08/20 2117) Pulse Rate:  [64-93] 91 (08/20 2117) Resp:  [16] 16 (08/20 2117) BP: (103-158)/(56-83) 124/64 mmHg (08/20 2117) SpO2:  [99 %] 99 % (08/20 2117) Weight:  [59.3 kg (130 lb 11.7 oz)] 59.3 kg (130 lb 11.7 oz) (08/20 1940) Weight change: -0.3 kg (-10.6 oz)  Intake/Output from previous day: 08/20 0701 - 08/21 0700 In: 240 [P.O.:240] Out: 1940 [Drains:40] Intake/Output this shift:    General appearance: alert and cooperative Resp: clear to auscultation bilaterally Chest wall: RIJ cath Cardio: S1, S2 normal and systolic murmur: holosystolic 2/6, blowing at apex GI: pos bs, liver down 4 cm Extremities: AVF LUA , wound dressed R thigh  Lab Results:  Recent Labs  10/01/14 1619 10/01/14 2238  WBC 6.8 6.5  HGB 7.9* 9.1*  HCT 24.5* 27.6*  PLT 245 209   BMET:  Recent Labs  10/01/14 0409 10/01/14 2238  NA 136 136  K 4.5 3.4*  CL 100* 98*  CO2 20* 21*  GLUCOSE 119* 301*  BUN 23* 9  CREATININE 4.68* 2.24*  CALCIUM 9.0 8.2*   No results for input(s): PTH in the last 72 hours. Iron Studies: No results for input(s): IRON, TIBC, TRANSFERRIN, FERRITIN in the last 72 hours.  Studies/Results: No results found.  I have reviewed the patient's current medications.  Assessment/Plan: 1 ESRD did well on HD 2 Anemia improved, cont esa 3 HPTH meds 4 DM controlled 5 Pyomyosits on AB, drained P HD TTS, Ancef, pain control,DM control    LOS: 5 days   Sakina Briones L 10/02/2014,9:37 AM

## 2014-10-02 NOTE — Progress Notes (Signed)
Lab draw from lab was unsuccessful.  Patient refuses to be stuck again.  MD made aware, per MD patient is not a candidate for PICC due to hemodialysis.  May consider collecting labs during dialysis, per Dr. Karleen Hampshire.  Will continue to monitor patient.  IV team unable to find additional IV access.  Will pass information on in report to oncoming RN.

## 2014-10-02 NOTE — Progress Notes (Signed)
PT Evaluation Note  Assessment: Pt admitted with above diagnosis. Pt currently with functional limitations due to the deficits listed below (see PT Problem List). Tracy [name] will have 24/7 assist from parents at home but will need to complete stair training prior to d/c.  Determine need for crutches vs. RW at next session.  Pt will benefit from skilled PT to increase their independence and safety with mobility to allow discharge to the venue listed below.    10/02/14 1049  PT Visit Information  Last PT Received On 10/02/14  Assistance Needed +1  History of Present Illness Pt is a 29 y/o F scheduled for I and D of Rt thigh abscess tomorrow (10/02/14).  Pt's PMH includes thyroid enlargement, anemia, DM, anxiety, HSV-1&2, HTn.  Precautions  Precautions Fall  Restrictions  Weight Bearing Restrictions Yes  RLE Weight Bearing WBAT  Home Living  Family/patient expects to be discharged to: Private residence  Living Arrangements Parent  Available Help at Discharge Family;Available 24 hours/day  Type of Home House  Home Access Stairs to enter  Entrance Stairs-Number of Steps 4  Entrance Stairs-Rails Right;Left  Home Layout Two level  Alternate Level Stairs-Number of Steps flight  Alternate Level Stairs-Rails Left  Home Equipment Crutches  Additional Comments Parents moved bed downstairs so she can stay downstairs  Prior Function  Level of Independence Independent  Comments 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 [employer] No difficulties  Pain Assessment  Pain Assessment 0-10  Pain Score 8  Pain Location Rt LE  Pain Descriptors / Indicators Throbbing  Pain Intervention(s) Limited activity within patient's tolerance;Monitored during session;Repositioned  Cognition  Arousal/Alertness Awake/alert  Behavior During Therapy WFL for tasks assessed/performed  Overall Cognitive Status Within Functional Limits for tasks assessed  Upper Extremity Assessment   Upper Extremity Assessment Defer to OT evaluation  Lower Extremity Assessment  Lower Extremity Assessment RLE deficits/detail  RLE Deficits / Details weakness and limited ROM 2/2 abscess Rt thigh  Cervical / Trunk Assessment  Cervical / Trunk Assessment Normal  Bed Mobility  Overal bed mobility Needs Assistance  Bed Mobility Supine to Sit  Supine to sit Min assist;HOB elevated  General bed mobility comments Min assist managing Rt LE to EOB. Increased time.  HOB slightly elevated and use of rails.  Transfers  Overall transfer level Needs assistance  Equipment used Rolling walker (2 wheeled)  Transfers Sit to/from Stand  Sit to Stand Min guard  General transfer comment Cues for technique and hand placement.  Increased time.  Weight shifts performed at bedside prior to ambulation.  Ambulation/Gait  Ambulation/Gait assistance Min guard  Ambulation Distance (Feet) 45 Feet  Assistive device Rolling walker (2 wheeled)  Gait Pattern/deviations Step-to pattern;Antalgic;Trunk flexed;Decreased weight shift to right;Decreased stride length;Decreased stance time - right  General Gait Details Increased WB through Bil UEs to offload Rt LE.  Cues to stand upright and to WBAT through Rt LE.  Gait velocity interpretation Below normal speed for age/gender  Balance  Overall balance assessment Needs assistance  Sitting-balance support No upper extremity supported;Feet supported  Sitting balance-Leahy Scale Good  Standing balance support Bilateral upper extremity supported;During functional activity  Standing balance-Leahy Scale Poor  Standing balance comment Relies on RW for support 2/2 pain in Rt LE  Exercises  Exercises General Lower Extremity  General Exercises - Lower Extremity  Ankle Circles/Pumps AROM;Both;15 reps;Supine  Quad Sets Strengthening;Both;10 reps;Supine  PT - End of Session  Equipment Utilized During Treatment Gait belt  Activity Tolerance Patient limited by pain  Patient left  in chair;with call bell/phone within reach  Nurse Communication Mobility status;Precautions;Weight bearing status  PT Assessment  PT Therapy Diagnosis  Difficulty walking;Abnormality of gait;Acute pain  PT Recommendation/Assessment Patient needs continued PT services  PT Problem List Decreased strength;Decreased range of motion;Decreased activity tolerance;Decreased balance;Decreased mobility;Decreased knowledge of use of DME;Decreased safety awareness;Decreased knowledge of precautions;Decreased skin integrity;Pain  Barriers to Discharge Inaccessible home environment  Barriers to Discharge Comments 4 steps to enter home  PT Plan  PT Frequency (ACUTE ONLY) Min 3X/week  PT Treatment/Interventions (ACUTE ONLY) DME instruction;Gait training;Stair training;Functional mobility training;Therapeutic activities;Therapeutic exercise;Balance training;Neuromuscular re-education;Patient/family education;Modalities  PT Recommendation  Follow Up Recommendations No PT follow up  PT equipment Other (comment) (TBD at next session (RW vs. Crutches))  Individuals Consulted  Consulted and Agree with Results and Recommendations Patient  Acute Rehab PT Goals  Patient Stated Goal get better and go home  PT Goal Formulation With patient  Time For Goal Achievement 10/16/14  Potential to Achieve Goals Good  PT Time Calculation  PT Start Time (ACUTE ONLY) 1046  PT Stop Time (ACUTE ONLY) 1111  PT Time Calculation (min) (ACUTE ONLY) 25 min  PT General Charges  $$ ACUTE PT VISIT 1 Procedure  PT Evaluation  $Initial PT Evaluation Tier I 1 Procedure  PT Treatments  $Gait Training 8-22 mins  Written Expression  Dominant Hand Right   Joslyn Hy PT, DPT [phone] Pager: [phone]

## 2014-10-02 NOTE — Progress Notes (Signed)
Patient ID: Tracy [name], female   DOB: [birth year], 29 y.o.   MRN: [id number]     Subjective:  Patient reports pain as mild.  Patient states that she feels better.  Reports low grade temp. Denies chills.  Objective:   VITALS:   Filed Vitals:   10/01/14 1800 10/01/14 1930 10/01/14 1940 10/01/14 2117  BP: 116/63 132/68 123/63 124/64  Pulse: 92 90 86 91  Temp:   98 F (36.7 C) 99.6 F (37.6 C)  TempSrc:   Oral Oral  Resp:   16 16  Height:      Weight:   59.3 kg (130 lb 11.7 oz)   SpO2:    99%    ABD soft Sensation intact distally Dorsiflexion/Plantar flexion intact Incision: dressing C/D/I and scant drainage Wound vac in place and active  Lab Results  Component Value Date   WBC 6.5 10/01/2014   HGB 9.1* 10/01/2014   HCT 27.6* 10/01/2014   MCV 88.5 10/01/2014   PLT 209 10/01/2014   BMET    Component Value Date/Time   NA 136 10/01/2014 2238   NA 133* 01/18/2013 1106   K 3.4* 10/01/2014 2238   CL 98* 10/01/2014 2238   CO2 21* 10/01/2014 2238   GLUCOSE 301* 10/01/2014 2238   GLUCOSE 929* 01/18/2013 1106   BUN 9 10/01/2014 2238   BUN 37* 01/18/2013 1106   CREATININE 2.24* 10/01/2014 2238   CALCIUM 8.2* 10/01/2014 2238   GFRNONAA 28* 10/01/2014 2238   GFRAA 33* 10/01/2014 2238     Assessment/Plan: 2 Days Post-Op   Principal Problem:   Abscess of right thigh Active Problems:   Hypothyroidism   Myositis   DM I (diabetes mellitus, type I), uncontrolled   Essential hypertension   Chronic anemia   Lower extremity pain, right   Advance diet Up with therapy Continue plan per medicine Plan for repeat I&D tomorrow with Dr Percell Miller NPO after midnight.   [name] 10/02/2014, 10:25 AM  Discussed and agree with above.   [name], MD Cell [phone]

## 2014-10-02 NOTE — Progress Notes (Signed)
Patient continues to complain of constipation, reports only being able to produce small stool.  PRN biscodyl suppository given (see MAR).  Will continue to monitor.

## 2014-10-03 ENCOUNTER — Inpatient Hospital Stay (HOSPITAL_COMMUNITY): Payer: BLUE CROSS/BLUE SHIELD | Admitting: Anesthesiology

## 2014-10-03 ENCOUNTER — Encounter (HOSPITAL_COMMUNITY): Payer: Self-pay | Admitting: Anesthesiology

## 2014-10-03 ENCOUNTER — Encounter (HOSPITAL_COMMUNITY)
Admission: EM | Disposition: A | Discharge status: Home / Self Care | Payer: BLUE CROSS/BLUE SHIELD | Source: Home / Self Care | Attending: Internal Medicine

## 2014-10-03 HISTORY — PX: I&D EXTREMITY: SHX5045

## 2014-10-03 HISTORY — PX: APPLICATION OF WOUND VAC: SHX5189

## 2014-10-03 HISTORY — PX: APPLICATION OF A-CELL OF EXTREMITY: SHX6303

## 2014-10-03 LAB — POCT I-STAT 4, (NA,K, GLUC, HGB,HCT)
Glucose, Bld: 158 mg/dL — ABNORMAL HIGH (ref 65–99)
HEMATOCRIT: 25 % — AB (ref 36.0–46.0)
HEMOGLOBIN: 8.5 g/dL — AB (ref 12.0–15.0)
Potassium: 3.2 mmol/L — ABNORMAL LOW (ref 3.5–5.1)
Sodium: 137 mmol/L (ref 135–145)

## 2014-10-03 LAB — GLUCOSE, CAPILLARY
GLUCOSE-CAPILLARY: 218 mg/dL — AB (ref 65–99)
GLUCOSE-CAPILLARY: 245 mg/dL — AB (ref 65–99)
GLUCOSE-CAPILLARY: 271 mg/dL — AB (ref 65–99)
GLUCOSE-CAPILLARY: 335 mg/dL — AB (ref 65–99)
Glucose-Capillary: 229 mg/dL — ABNORMAL HIGH (ref 65–99)
Glucose-Capillary: 134 mg/dL — ABNORMAL HIGH (ref 65–99)
Glucose-Capillary: 324 mg/dL — ABNORMAL HIGH (ref 65–99)

## 2014-10-03 SURGERY — IRRIGATION AND DEBRIDEMENT EXTREMITY
Anesthesia: General | Site: Leg Upper | Laterality: Right

## 2014-10-03 MED ORDER — DIAZEPAM 5 MG PO TABS
5.0000 mg | ORAL_TABLET | Freq: Four times a day (QID) | ORAL | Status: DC | PRN
  Administered 2014-10-03 – 2014-10-05 (×5): 5 mg via ORAL
  Filled 2014-10-03 (×5): qty 1

## 2014-10-03 MED ORDER — MEPERIDINE HCL 25 MG/ML IJ SOLN: 6.2500 mg | INTRAMUSCULAR | Status: DC | PRN

## 2014-10-03 MED ORDER — MORPHINE SULFATE (PF) 2 MG/ML IV SOLN
INTRAVENOUS | Status: AC
  Filled 2014-10-03: qty 1

## 2014-10-03 MED ORDER — ONDANSETRON HCL 4 MG/2ML IJ SOLN
INTRAMUSCULAR | Status: DC | PRN
  Administered 2014-10-03 (×2): 4 mg via INTRAVENOUS

## 2014-10-03 MED ORDER — PROMETHAZINE HCL 25 MG/ML IJ SOLN
6.2500 mg | INTRAMUSCULAR | Status: DC | PRN
Start: 2014-10-03 — End: 2014-10-03

## 2014-10-03 MED ORDER — MIDAZOLAM HCL 2 MG/2ML IJ SOLN
0.5000 mg | Freq: Once | INTRAMUSCULAR | Status: AC | PRN
  Administered 2014-10-03: 1 mg via INTRAVENOUS

## 2014-10-03 MED ORDER — OXYCODONE-ACETAMINOPHEN 5-325 MG PO TABS: 1.0000 | ORAL_TABLET | ORAL | Status: DC | PRN

## 2014-10-03 MED ORDER — POTASSIUM CHLORIDE CRYS ER 20 MEQ PO TBCR
10.0000 meq | EXTENDED_RELEASE_TABLET | Freq: Once | ORAL | Status: AC
  Administered 2014-10-03: 10 meq via ORAL
  Filled 2014-10-03: qty 1

## 2014-10-03 MED ORDER — OXYCODONE HCL 5 MG PO TABS
5.0000 mg | ORAL_TABLET | ORAL | Status: DC | PRN
  Administered 2014-10-03 – 2014-10-05 (×11): 10 mg via ORAL
  Filled 2014-10-03 (×9): qty 2

## 2014-10-03 MED ORDER — FENTANYL CITRATE (PF) 100 MCG/2ML IJ SOLN
INTRAMUSCULAR | Status: DC | PRN
Start: 2014-10-03 — End: 2014-10-03
  Administered 2014-10-03: 100 ug via INTRAVENOUS
  Administered 2014-10-03 (×2): 50 ug via INTRAVENOUS
  Administered 2014-10-03: 100 ug via INTRAVENOUS
  Administered 2014-10-03: 50 ug via INTRAVENOUS

## 2014-10-03 MED ORDER — FENTANYL CITRATE (PF) 250 MCG/5ML IJ SOLN
INTRAMUSCULAR | Status: AC
  Filled 2014-10-03: qty 5

## 2014-10-03 MED ORDER — MIDAZOLAM HCL 2 MG/2ML IJ SOLN
INTRAMUSCULAR | Status: AC
  Filled 2014-10-03: qty 2

## 2014-10-03 MED ORDER — MIDAZOLAM HCL 5 MG/5ML IJ SOLN
INTRAMUSCULAR | Status: DC | PRN
  Administered 2014-10-03 (×2): 1 mg via INTRAVENOUS

## 2014-10-03 MED ORDER — SODIUM CHLORIDE 0.9 % IR SOLN
Status: DC | PRN
  Administered 2014-10-03: 3000 mL
  Administered 2014-10-03: 1000 mL
  Administered 2014-10-03: 3000 mL

## 2014-10-03 MED ORDER — PROPOFOL 10 MG/ML IV BOLUS
INTRAVENOUS | Status: AC
  Filled 2014-10-03: qty 20

## 2014-10-03 MED ORDER — MORPHINE SULFATE (PF) 2 MG/ML IV SOLN
1.0000 mg | INTRAVENOUS | Status: DC | PRN
  Administered 2014-10-03 (×2): 2 mg via INTRAVENOUS

## 2014-10-03 MED ORDER — INSULIN GLARGINE 100 UNIT/ML ~~LOC~~ SOLN
10.0000 [IU] | Freq: Every day | SUBCUTANEOUS | Status: DC
  Administered 2014-10-03 – 2014-10-04 (×2): 10 [IU] via SUBCUTANEOUS
  Filled 2014-10-03 (×3): qty 0.1

## 2014-10-03 MED ORDER — DEXTROSE-NACL 5-0.45 % IV SOLN: 100.0000 mL/h | INTRAVENOUS | Status: DC

## 2014-10-03 MED ORDER — PROPOFOL 10 MG/ML IV BOLUS
INTRAVENOUS | Status: DC | PRN
  Administered 2014-10-03: 140 mg via INTRAVENOUS
  Administered 2014-10-03: 40 mg via INTRAVENOUS
  Administered 2014-10-03: 20 mg via INTRAVENOUS

## 2014-10-03 MED ORDER — MORPHINE SULFATE (PF) 2 MG/ML IV SOLN
1.0000 mg | INTRAVENOUS | Status: DC | PRN
  Administered 2014-10-03: 2 mg via INTRAVENOUS

## 2014-10-03 MED ORDER — MORPHINE SULFATE (PF) 4 MG/ML IV SOLN
INTRAVENOUS | Status: AC
  Filled 2014-10-03: qty 1

## 2014-10-03 MED ORDER — CEFAZOLIN SODIUM-DEXTROSE 2-3 GM-% IV SOLR
2.0000 g | INTRAVENOUS | Status: DC
  Filled 2014-10-03: qty 50

## 2014-10-03 MED ORDER — MIDAZOLAM HCL 2 MG/2ML IJ SOLN
INTRAMUSCULAR | Status: AC
  Filled 2014-10-03: qty 4

## 2014-10-03 MED ORDER — LACTATED RINGERS IV SOLN: INTRAVENOUS | Status: DC

## 2014-10-03 MED ORDER — HYDROMORPHONE HCL 1 MG/ML IJ SOLN: 0.2500 mg | INTRAMUSCULAR | Status: DC | PRN

## 2014-10-03 MED ORDER — KETOROLAC TROMETHAMINE 30 MG/ML IJ SOLN: 30.0000 mg | Freq: Once | INTRAMUSCULAR | Status: DC

## 2014-10-03 MED ORDER — ACETAMINOPHEN 500 MG PO TABS: 1000.0000 mg | ORAL_TABLET | Freq: Once | ORAL | Status: DC

## 2014-10-03 SURGICAL SUPPLY — 60 items
BANDAGE ELASTIC 4 VELCRO ST LF (GAUZE/BANDAGES/DRESSINGS) ×2 IMPLANT
BANDAGE ELASTIC 6 VELCRO ST LF (GAUZE/BANDAGES/DRESSINGS) ×2 IMPLANT
BLADE SURG 10 STRL SS (BLADE) ×2 IMPLANT
BNDG COHESIVE 4X5 TAN STRL (GAUZE/BANDAGES/DRESSINGS) ×3 IMPLANT
BNDG GAUZE ELAST 4 BULKY (GAUZE/BANDAGES/DRESSINGS) ×2 IMPLANT
BOOTCOVER CLEANROOM LRG (PROTECTIVE WEAR) ×3 IMPLANT
CANISTER WOUND CARE 500ML ATS (WOUND CARE) ×1 IMPLANT
COVER SURGICAL LIGHT HANDLE (MISCELLANEOUS) ×2 IMPLANT
CUFF TOURNIQUET SINGLE 34IN LL (TOURNIQUET CUFF) IMPLANT
DRAPE ORTHO SPLIT 77X108 STRL (DRAPES) ×4
DRAPE SURG ORHT 6 SPLT 77X108 (DRAPES) IMPLANT
DRSG ADAPTIC 3X8 NADH LF (GAUZE/BANDAGES/DRESSINGS) ×1 IMPLANT
DRSG VAC ATS SM SENSATRAC (GAUZE/BANDAGES/DRESSINGS) ×1 IMPLANT
DURAPREP 26ML APPLICATOR (WOUND CARE) ×2 IMPLANT
ELECT REM PT RETURN 9FT ADLT (ELECTROSURGICAL)
ELECTRODE REM PT RTRN 9FT ADLT (ELECTROSURGICAL) IMPLANT
EVACUATOR 1/8 PVC DRAIN (DRAIN) ×1 IMPLANT
FACESHIELD WRAPAROUND (MASK) ×4 IMPLANT
FACESHIELD WRAPAROUND OR TEAM (MASK) ×3 IMPLANT
GAUZE SPONGE 4X4 12PLY STRL (GAUZE/BANDAGES/DRESSINGS) ×2 IMPLANT
GAUZE XEROFORM 1X8 LF (GAUZE/BANDAGES/DRESSINGS) ×2 IMPLANT
GLOVE BIO SURGEON STRL SZ7 (GLOVE) ×2 IMPLANT
GLOVE BIO SURGEON STRL SZ7.5 (GLOVE) ×1 IMPLANT
GLOVE BIO SURGEON STRL SZ8 (GLOVE) ×2 IMPLANT
GLOVE BIOGEL PI IND STRL 6.5 (GLOVE) IMPLANT
GLOVE BIOGEL PI IND STRL 7.0 (GLOVE) ×1 IMPLANT
GLOVE BIOGEL PI IND STRL 8 (GLOVE) IMPLANT
GLOVE BIOGEL PI INDICATOR 6.5 (GLOVE) ×2
GLOVE BIOGEL PI INDICATOR 7.0 (GLOVE) ×1
GLOVE BIOGEL PI INDICATOR 8 (GLOVE) ×1
GLOVE ECLIPSE 6.5 STRL STRAW (GLOVE) ×1 IMPLANT
GLOVE ORTHO TXT STRL SZ7.5 (GLOVE) ×4 IMPLANT
GLOVE SURG SS PI 7.0 STRL IVOR (GLOVE) ×1 IMPLANT
GOWN STRL REUS W/ TWL LRG LVL3 (GOWN DISPOSABLE) ×3 IMPLANT
GOWN STRL REUS W/TWL 2XL LVL3 (GOWN DISPOSABLE) ×2 IMPLANT
GOWN STRL REUS W/TWL LRG LVL3 (GOWN DISPOSABLE) ×6
HANDPIECE INTERPULSE COAX TIP (DISPOSABLE)
KIT BASIN OR (CUSTOM PROCEDURE TRAY) ×2 IMPLANT
KIT ROOM TURNOVER OR (KITS) ×2 IMPLANT
MANIFOLD NEPTUNE II (INSTRUMENTS) ×2 IMPLANT
MICROMATRIX 500MG (Tissue) ×2 IMPLANT
NS IRRIG 1000ML POUR BTL (IV SOLUTION) ×2 IMPLANT
PACK ORTHO EXTREMITY (CUSTOM PROCEDURE TRAY) ×2 IMPLANT
PAD ARMBOARD 7.5X6 YLW CONV (MISCELLANEOUS) ×4 IMPLANT
PENCIL BUTTON HOLSTER BLD 10FT (ELECTRODE) IMPLANT
SET CYSTO W/LG BORE CLAMP LF (SET/KITS/TRAYS/PACK) ×1 IMPLANT
SET HNDPC FAN SPRY TIP SCT (DISPOSABLE) IMPLANT
SOLUTION PARTIC MCRMTRX 500MG (Tissue) IMPLANT
SPONGE GAUZE 4X4 12PLY STER LF (GAUZE/BANDAGES/DRESSINGS) ×1 IMPLANT
SPONGE LAP 18X18 X RAY DECT (DISPOSABLE) ×2 IMPLANT
STOCKINETTE IMPERVIOUS 9X36 MD (GAUZE/BANDAGES/DRESSINGS) ×3 IMPLANT
SUT ETHILON 3 0 PS 1 (SUTURE) ×3 IMPLANT
TOWEL OR 17X24 6PK STRL BLUE (TOWEL DISPOSABLE) ×2 IMPLANT
TOWEL OR 17X26 10 PK STRL BLUE (TOWEL DISPOSABLE) ×2 IMPLANT
TOWEL OR NON WOVEN STRL DISP B (DISPOSABLE) ×2 IMPLANT
TUBE ANAEROBIC SPECIMEN COL (MISCELLANEOUS) IMPLANT
TUBE CONNECTING 12X1/4 (SUCTIONS) ×2 IMPLANT
UNDERPAD 30X30 INCONTINENT (UNDERPADS AND DIAPERS) ×2 IMPLANT
WATER STERILE IRR 1000ML POUR (IV SOLUTION) ×2 IMPLANT
YANKAUER SUCT BULB TIP NO VENT (SUCTIONS) ×2 IMPLANT

## 2014-10-03 NOTE — Anesthesia Postprocedure Evaluation (Signed)
  Anesthesia Post-op Note  Patient: Tracy [name]  Procedure(s) Performed: Procedure(s): IRRIGATION AND DEBRIDEMENT RIGHT THIGH ABSCESS,WITH WOUND CLOSURE & MANIPULATION OF RIGHT KNEE (Right) APPLICATION OF WOUND VAC RIGHT THIGH (Right) APPLICATION OF A-CELL OF RIGHT UPPER THIGH WOUND (Right)  Patient Location: PACU  Anesthesia Type:General  Level of Consciousness: awake and alert   Airway and Oxygen Therapy: Patient Spontanous Breathing  Post-op Pain: mild  Post-op Assessment: Post-op Vital signs reviewed and Patient's Cardiovascular Status Stable     RLE Motor Response: Purposeful movement, Responds to commands RLE Sensation: Pain, Full sensation      Post-op Vital Signs: Reviewed and stable  Last Vitals:  Filed Vitals:   10/03/14 1635  BP: 163/74  Pulse: 97  Temp:   Resp: 14    Complications: No apparent anesthesia complications

## 2014-10-03 NOTE — Op Note (Signed)
09/27/2014 - 10/03/2014  2:45 PM  PATIENT:  Tracy [name]    PRE-OPERATIVE DIAGNOSIS:   RIGHT THIGH ABSCESS  POST-OPERATIVE DIAGNOSIS:  Same  PROCEDURE:  IRRIGATION AND DEBRIDEMENT RIGHT THIGH ABSCESS,WITH WOUND CLOSURE & MANIPULATION OF RIGHT KNEE, APPLICATION OF WOUND VAC RIGHT THIGH, APPLICATION OF A-CELL OF RIGHT UPPER THIGH WOUND  SURGEON:  MURPHY, TIMOTHY D, MD  ASSISTANT: Nehemiah Massed, PA-C, She was present and scrubbed throughout the case, critical for completion in a timely fashion, and for retraction, instrumentation, and closure.   ANESTHESIA:   gen  PREOPERATIVE INDICATIONS:  Tracy [name] is a  29 y.o. female with a diagnosis of  RIGHT THIGH ABSCESS who failed conservative measures and elected for surgical management.    The risks benefits and alternatives were discussed with the patient preoperatively including but not limited to the risks of infection, bleeding, nerve injury, cardiopulmonary complications, the need for revision surgery, among others, and the patient was willing to proceed.  OPERATIVE IMPLANTS: A-cell powder 500mg   OPERATIVE FINDINGS: no purulent fluid, no necrotic fluid  BLOOD LOSS: [id number]  COMPLICATIONS: none  TOURNIQUET TIME: none  OPERATIVE PROCEDURE:  Patient was identified in the preoperative holding area and site was marked by me She was transported to the operating theater and placed on the table in supine position taking care to pad all bony prominences. After a preincinduction time out anesthesia was induced. The right lower extremity was prepped and draped in normal sterile fashion and a pre-incision timeout was performed. She received ancef for preoperative antibiotics.   I began by thoroughly irrigating her open incision that was 10 cm long. I probed the entire incision debrided I performed a sharp debridement with VAC Cobb pickup and scissors. I saw no purulent fluid did track down to the periosteum anteriorly.  I irrigated it  with 6 L of saline. I felt that it was clean and appropriate for closure I did place a cell powder 500 mg in the deeper anterior aspect of the wound. I then placed a Hemovac drain in this area as well. I performed a complex closure of her 10 cm incision.  I placed a incisional VAC followed by sterile dressing she was taken to the PACU in stable condition.  POST OPERATIVE PLAN: WBAT, IV abx plan and remove drain/vac prior to D/c when appropriate.     This note was generated using a template and dragon dictation system. In light of that, I have reviewed the note and all aspects of it are applicable to this case. Any dictation errors are due to the computerized dictation system.

## 2014-10-03 NOTE — Anesthesia Procedure Notes (Signed)
Procedure Name: LMA Insertion Date/Time: 10/03/2014 1:45 PM Performed by: Jacquiline Doe A Pre-anesthesia Checklist: Patient identified, Timeout performed, Emergency Drugs available, Suction available and Patient being monitored Patient Re-evaluated:Patient Re-evaluated prior to inductionOxygen Delivery Method: Circle system utilized Preoxygenation: Pre-oxygenation with 100% oxygen Intubation Type: IV induction Ventilation: Mask ventilation without difficulty LMA: LMA inserted LMA Size: 3.0 Tube type: Oral Number of attempts: 1 Placement Confirmation: breath sounds checked- equal and bilateral and positive ETCO2 Tube secured with: Tape Dental Injury: Teeth and Oropharynx as per pre-operative assessment

## 2014-10-03 NOTE — Transfer of Care (Signed)
Immediate Anesthesia Transfer of Care Note  Patient: Tracy [name]  Procedure(s) Performed: Procedure(s): IRRIGATION AND DEBRIDEMENT RIGHT THIGH ABSCESS,WITH WOUND CLOSURE & MANIPULATION OF RIGHT KNEE (Right) APPLICATION OF WOUND VAC RIGHT THIGH (Right) APPLICATION OF A-CELL OF RIGHT UPPER THIGH WOUND (Right)  Patient Location: PACU  Anesthesia Type:General  Level of Consciousness: awake, oriented, sedated, patient cooperative and responds to stimulation  Airway & Oxygen Therapy: Patient Spontanous Breathing and Patient connected to nasal cannula oxygen  Post-op Assessment: Report given to RN, Post -op Vital signs reviewed and stable, Patient moving all extremities and Patient moving all extremities X 4  Post vital signs: Reviewed and stable  Last Vitals:  Filed Vitals:   10/03/14 0515  BP: 131/66  Pulse: 95  Temp: 37.1 C  Resp: 16    Complications: No apparent anesthesia complications

## 2014-10-03 NOTE — H&P (View-Only) (Signed)
Patient ID: Tracy [name], female   DOB: 11/06/1985, 28 y.o.   MRN: [id number]     Subjective:  Patient reports pain as mild.  Patient states that she feels better.  Reports low grade temp. Denies chills.  Objective:   VITALS:   Filed Vitals:   10/01/14 1800 10/01/14 1930 10/01/14 1940 10/01/14 2117  BP: 116/63 132/68 123/63 124/64  Pulse: 92 90 86 91  Temp:   98 F (36.7 C) 99.6 F (37.6 C)  TempSrc:   Oral Oral  Resp:   16 16  Height:      Weight:   59.3 kg (130 lb 11.7 oz)   SpO2:    99%    ABD soft Sensation intact distally Dorsiflexion/Plantar flexion intact Incision: dressing C/D/I and scant drainage Wound vac in place and active  Lab Results  Component Value Date   WBC 6.5 10/01/2014   HGB 9.1* 10/01/2014   HCT 27.6* 10/01/2014   MCV 88.5 10/01/2014   PLT 209 10/01/2014   BMET    Component Value Date/Time   NA 136 10/01/2014 2238   NA 133* 01/18/2013 1106   K 3.4* 10/01/2014 2238   CL 98* 10/01/2014 2238   CO2 21* 10/01/2014 2238   GLUCOSE 301* 10/01/2014 2238   GLUCOSE 929* 01/18/2013 1106   BUN 9 10/01/2014 2238   BUN 37* 01/18/2013 1106   CREATININE 2.24* 10/01/2014 2238   CALCIUM 8.2* 10/01/2014 2238   GFRNONAA 28* 10/01/2014 2238   GFRAA 33* 10/01/2014 2238     Assessment/Plan: 2 Days Post-Op   Principal Problem:   Abscess of right thigh Active Problems:   Hypothyroidism   Myositis   DM I (diabetes mellitus, type I), uncontrolled   Essential hypertension   Chronic anemia   Lower extremity pain, right   Advance diet Up with therapy Continue plan per medicine Plan for repeat I&D tomorrow with Dr Percell Miller NPO after midnight.   Tracy [name] 10/02/2014, 10:25 AM  Discussed and agree with above.   Tracy Bond, MD Cell [phone]

## 2014-10-03 NOTE — Progress Notes (Signed)
[name] PROGRESS NOTE  Tracy [name] [id number] DOB: 1985-03-25 DOA: 09/27/2014 PCP: [name] Interim summary: 29 y.o. female who was recently admitted last month for myositis an MSSA bacteremia presents with recurrent right lower extremity pain and tenderness. Orthopedics and ID consulted.  Assessment/Plan: 1. Right lower extremity pain with possible recurrence of myositis with abscess and phlegmon; Orthopedics consulted and underwent ultrasound guided aspiration of the abscess and sent for cultures. Meanwhile patient was restarted on cefazolin and Dr Megan Salon assisting [place] with antibiotics. So far, blood cultures have been negative .  Pain control and  She underwent  further debridement by orthopedics on 8/19. Wound vac was placed.  Repeat I&D  by Dr Percell Miller today.    2. ESRD on hemodialysis - Tuesday Thursday and Saturday. Patient was recently started on dialysis last month. Consulted nephrology for dialysis.  3. Type 1 diabetes Mellitus: CBG (last 3)   Recent Labs  10/03/14 1056 10/03/14 1503 10/03/14 1657  GLUCAP 229* 134* 245*    Resume SSI. Increased lantus to 10 units daily.      Anemia: Anemia of chronic disease and anemia of blood loss:  Monitor hemoglobin and transfuse if hemoglobin is less than 7.    Hypertension: Controlled.    Hypothyroidism: Resume synthroid.    Constipation: Senna, colace and miralax ordered.    Code Status: full code.  Family Communication: familly at bedside Disposition Plan: pending.    Consultants:  Radiology  ID  Renal.  Procedures: US guided aspiration of 2 sites of pain/inflammation of RLE.  1 = medial thigh, where there is post-op chages/scar. 2 = lateral knee. Fluid collection  Debridement of the abscess by orthopedics on 8/19  Antibiotics:  cefazolin  HPI/Subjective: Pain is moderate, no nausea, or vomiting.   Objective: Filed Vitals:   10/03/14 1700  BP: 159/65  Pulse:  102  Temp: 98.2 F (36.8 C)  Resp: 16    Intake/Output Summary (Last 24 hours) at 10/03/14 1744 Last data filed at 10/03/14 1445  Gross per 24 hour  Intake    440 ml  Output     21 ml  Net    419 ml   Filed Weights   09/29/14 1500 10/01/14 0500 10/01/14 1940  Weight: 61.2 kg (134 lb 14.7 oz) 59.6 kg (131 lb 6.3 oz) 59.3 kg (130 lb 11.7 oz)    Exam:   General:  Alert in no distress from pain .  Cardiovascular: s1s2  Respiratory: ctab  Abdomen: soft NT ND BS+  Musculoskeletal: RIGHT thigh swelling and tenderness.   Data Reviewed: Basic Metabolic Panel:  Recent Labs Lab 09/28/14 0550 09/29/14 1057 09/30/14 0347 10/01/14 0409 10/01/14 2238 10/03/14 1307  NA 139 138 134* 136 136 137  K 3.3* 3.5 3.9 4.5 3.4* 3.2*  CL 102 99* 97* 100* 98*  --   CO2 28 26 26  20* 21*  --   GLUCOSE 103* 88 118* 119* 301* 158*  BUN 17 23* 10 23* 9  --   CREATININE 3.94* 5.29* 3.20* 4.68* 2.24*  --   CALCIUM 9.1 9.2 9.2 9.0 8.2*  --   PHOS  --  4.2  --   --  1.9*  --    Liver Function Tests:  Recent Labs Lab 09/27/14 2025 09/28/14 0550 09/29/14 1057 10/01/14 2238  AST 24 18  --   --   ALT 6* <5*  --   --   ALKPHOS 97 78  --   --  BILITOT 0.5 0.2*  --   --   PROT 8.1 6.6  --   --   ALBUMIN 3.4* 2.7* 2.6* 2.5*   No results for input(s): LIPASE, AMYLASE in the last 168 hours. No results for input(s): AMMONIA in the last 168 hours. CBC:  Recent Labs Lab 09/27/14 2025 09/28/14 0550 09/29/14 1057 10/01/14 1619 10/01/14 2238 10/03/14 1307  WBC 8.7 5.7 10.3 6.8 6.5  --   NEUTROABS 6.9 3.5 8.5* 4.7  --   --   HGB 10.3* 9.1* 9.1* 7.9* 9.1* 8.5*  HCT 31.8* 28.7* 27.9* 24.5* 27.6* 25.0*  MCV 88.6 89.1 88.6 88.1 88.5  --   PLT 299 222 250 245 209  --    Cardiac Enzymes: No results for input(s): CKTOTAL, CKMB, CKMBINDEX, TROPONINI in the last 168 hours. BNP (last 3 results) No results for input(s): BNP in the last 8760 hours.  ProBNP (last 3 results) No results for  input(s): PROBNP in the last 8760 hours.  CBG:  Recent Labs Lab 10/03/14 0515 10/03/14 0802 10/03/14 1056 10/03/14 1503 10/03/14 1657  GLUCAP 271* 335* 229* 134* 245*    Recent Results (from the past 240 hour(s))  Surgical pcr screen     Status: None   Collection Time: 09/28/14  1:31 AM  Result Value Ref Range Status   MRSA, PCR NEGATIVE NEGATIVE Final   Staphylococcus aureus NEGATIVE NEGATIVE Final    Comment:        The Xpert SA Assay (FDA approved for NASAL specimens in patients over 32 years of age), is one component of a comprehensive surveillance program.  Test performance has been validated by [facility] for patients greater than or equal to 71 year old. It is not intended to diagnose infection nor to guide or monitor treatment.   Culture, routine-abscess     Status: None   Collection Time: 09/28/14  5:30 PM  Result Value Ref Range Status   Specimen Description ABSCESS RIGHT KNEE  Final   Special Requests NO2  Final   Gram Stain   Final    RARE WBC PRESENT, PREDOMINANTLY PMN NO SQUAMOUS EPITHELIAL CELLS SEEN NO ORGANISMS SEEN Performed at [facility]    Culture   Final    NO GROWTH 3 DAYS Performed at [facility]    Report Status 10/02/2014 FINAL  Final  Culture, routine-abscess     Status: None   Collection Time: 09/28/14  5:30 PM  Result Value Ref Range Status   Specimen Description ABSCESS LEFT THIGH  Final   Special Requests NO1  Final   Gram Stain   Final    RARE WBC PRESENT, PREDOMINANTLY PMN NO SQUAMOUS EPITHELIAL CELLS SEEN NO ORGANISMS SEEN Performed at [facility]    Culture   Final    NO GROWTH 3 DAYS Performed at [facility]    Report Status 10/02/2014 FINAL  Final  Anaerobic culture     Status: None (Preliminary result)   Collection Time: 09/30/14  5:48 PM  Result Value Ref Range Status   Specimen Description SYNOVIAL RIGHT KNEE  Final   Special Requests NONE  Final   Gram Stain   Final     WBC PRESENT,BOTH PMN AND MONONUCLEAR NO ORGANISMS SEEN CYTOSPIN SMEAR    Culture   Final    NO ANAEROBES ISOLATED; CULTURE IN PROGRESS FOR 5 DAYS   Report Status PENDING  Incomplete  Body fluid culture     Status: None (Preliminary result)  Collection Time: 09/30/14  5:48 PM  Result Value Ref Range Status   Specimen Description SYNOVIAL RIGHT KNEE  Final   Special Requests NONE  Final   Gram Stain   Final    CYTOSPIN WBC PRESENT,BOTH PMN AND MONONUCLEAR NO ORGANISMS SEEN    Culture NO GROWTH 3 DAYS  Final   Report Status PENDING  Incomplete  Tissue culture     Status: None (Preliminary result)   Collection Time: 09/30/14  6:12 PM  Result Value Ref Range Status   Specimen Description TISSUE RIGHT THIGH  Final   Special Requests NONE  Final   Gram Stain   Final    ABUNDANT WBC PRESENT,BOTH PMN AND MONONUCLEAR NO ORGANISMS SEEN Performed at [facility] Performed at [facility]    Culture PENDING  Incomplete   Report Status PENDING  Incomplete  Gram stain     Status: None   Collection Time: 09/30/14  6:12 PM  Result Value Ref Range Status   Specimen Description TISSUE RIGHT THIGH  Final   Special Requests NONE  Final   Gram Stain   Final    ABUNDANT WBC PRESENT,BOTH PMN AND MONONUCLEAR NO ORGANISMS SEEN    Report Status 09/30/2014 FINAL  Final     Studies: No results found.  Scheduled Meds: . amLODipine  10 mg Oral QHS  .  ceFAZolin (ANCEF) IV  2 g Intravenous Q T,Th,Sat-1800  . darbepoetin (ARANESP) injection - DIALYSIS  100 mcg Intravenous Q Thu-HD  . enoxaparin (LOVENOX) injection  30 mg Subcutaneous Q24H  . ferric gluconate (FERRLECIT/NULECIT) IV  125 mg Intravenous Q Thu-HD  . insulin aspart  0-9 Units Subcutaneous Q4H  . insulin glargine  10 Units Subcutaneous QHS  . levothyroxine  75 mcg Oral QAC breakfast  . midazolam      . morphine      . morphine      . multivitamin  1 tablet Oral QHS  . OxyCODONE  20 mg Oral Q12H  . polyethylene  glycol  17 g Oral Daily  . senna-docusate  2 tablet Oral BID  . sodium chloride  500 mL Intravenous Once  . sodium chloride  3 mL Intravenous Q12H   Continuous Infusions: . sodium chloride      Principal Problem:   Abscess of right thigh Active Problems:   Hypothyroidism   Myositis   DM I (diabetes mellitus, type I), uncontrolled   Essential hypertension   Chronic anemia   Lower extremity pain, right    Time spent: 25 minutes    [facility] Pager [phone]. If 7PM-7AM, please contact night-coverage at [email or web address], password [facility] 10/03/2014, 5:44 PM  LOS: 6 days

## 2014-10-03 NOTE — Progress Notes (Signed)
  [facility] Progress Note   Subjective: not eating much, wants to get over to oral pain meds  Filed Vitals:   10/02/14 0445 10/02/14 1724 10/02/14 2121 10/03/14 0515  BP: 121/62 116/56 119/65 131/66  Pulse: 84 94 100 95  Temp: 99 F (37.2 C)  99 F (37.2 C) 98.7 F (37.1 C)  TempSrc: Oral  Oral Oral  Resp: 16 17 16 16   Height:      Weight:      SpO2: 99% 100% 100% 99%   Exam: Alert calm no distress No jvd Chest clear bilat RRR no mrg abd soft ntnd +bs No leg edema LUA AVF (maturing), R IJ cath Neuro is alert, Ox 3  TTS Tracy [name]   4h  62.5kg leaving below  2/2 bath  Hep 7600  R IJ cath/ LUA AVF Mircera 50 q 2      Assessment: 1 ESRD HD tues 2 Anemia cont esa 3 HTPH cont meds 4 DM1 controlled 5 Pyomyositis on AB, drained 6 HTN bp soft below dry wt  Plan - HD tues, Ancef , pain meds     Kelly Splinter MD  pager [phone]    cell [phone]  10/03/2014, 8:48 AM     Recent Labs Lab 09/29/14 1057 09/30/14 0347 10/01/14 0409 10/01/14 2238  NA 138 134* 136 136  K 3.5 3.9 4.5 3.4*  CL 99* 97* 100* 98*  CO2 26 26 20* 21*  GLUCOSE 88 118* 119* 301*  BUN 23* 10 23* 9  CREATININE 5.29* 3.20* 4.68* 2.24*  CALCIUM 9.2 9.2 9.0 8.2*  PHOS 4.2  --   --  1.9*    Recent Labs Lab 09/27/14 2025 09/28/14 0550 09/29/14 1057 10/01/14 2238  AST 24 18  --   --   ALT 6* <5*  --   --   ALKPHOS 97 78  --   --   BILITOT 0.5 0.2*  --   --   PROT 8.1 6.6  --   --   ALBUMIN 3.4* 2.7* 2.6* 2.5*    Recent Labs Lab 09/28/14 0550 09/29/14 1057 10/01/14 1619 10/01/14 2238  WBC 5.7 10.3 6.8 6.5  NEUTROABS 3.5 8.5* 4.7  --   HGB 9.1* 9.1* 7.9* 9.1*  HCT 28.7* 27.9* 24.5* 27.6*  MCV 89.1 88.6 88.1 88.5  PLT 222 250 245 209   . amLODipine  10 mg Oral QHS  .  ceFAZolin (ANCEF) IV  2 g Intravenous Q T,Th,Sat-1800  . darbepoetin (ARANESP) injection - DIALYSIS  100 mcg Intravenous Q Thu-HD  . enoxaparin (LOVENOX) injection  30 mg Subcutaneous Q24H  .  ferric gluconate (FERRLECIT/NULECIT) IV  125 mg Intravenous Q Thu-HD  . insulin aspart  0-9 Units Subcutaneous Q4H  . insulin glargine  6 Units Subcutaneous QHS  . levothyroxine  75 mcg Oral QAC breakfast  . multivitamin  1 tablet Oral QHS  . OxyCODONE  20 mg Oral Q12H  . polyethylene glycol  17 g Oral Daily  . senna-docusate  2 tablet Oral BID  . sodium chloride  500 mL Intravenous Once  . sodium chloride  3 mL Intravenous Q12H   . sodium chloride     sodium chloride, sodium chloride, acetaminophen **OR** acetaminophen, alteplase, bisacodyl, diazepam, diphenhydrAMINE, feeding supplement (NEPRO CARB STEADY), heparin, HYDROmorphone (DILAUDID) injection, lidocaine (PF), lidocaine-prilocaine, methocarbamol **OR** methocarbamol (ROBAXIN)  IV, metoCLOPramide **OR** metoCLOPramide (REGLAN) injection, ondansetron **OR** ondansetron (ZOFRAN) IV, oxyCODONE, pentafluoroprop-tetrafluoroeth, polyethylene glycol, sodium phosphate, sorbitol

## 2014-10-03 NOTE — Progress Notes (Signed)
Doing ok.   Walked today.  Plan care per Dr. Alain Marion.  OR planned later this afternoon.   Johnny Bridge, MD

## 2014-10-03 NOTE — Anesthesia Preprocedure Evaluation (Addendum)
Anesthesia Evaluation  Patient identified by MRN, date of birth, ID band Patient awake    Reviewed: Allergy & Precautions, NPO status , Patient's Chart, lab work & pertinent test results  Airway Mallampati: I  TM Distance: >3 FB Neck ROM: Full    Dental  (+) Teeth Intact   Pulmonary neg pulmonary ROS,  breath sounds clear to auscultation        Cardiovascular hypertension, Pt. on medications Rhythm:Regular Rate:Normal     Neuro/Psych PSYCHIATRIC DISORDERS Anxiety negative neurological ROS     GI/Hepatic negative GI ROS,   Endo/Other  diabetes, Insulin DependentHypothyroidism   Renal/GU ESRFRenal disease  negative genitourinary   Musculoskeletal negative musculoskeletal ROS (+)   Abdominal   Peds negative pediatric ROS (+)  Hematology  (+) anemia ,   Anesthesia Other Findings   Reproductive/Obstetrics                            Anesthesia Physical Anesthesia Plan  ASA: III  Anesthesia Plan: General   Post-op Pain Management:    Induction: Intravenous  Airway Management Planned: LMA  Additional Equipment:   Intra-op Plan:   Post-operative Plan: Extubation in OR  Informed Consent: I have reviewed the patients History and Physical, chart, labs and discussed the procedure including the risks, benefits and alternatives for the proposed anesthesia with the patient or authorized representative who has indicated his/her understanding and acceptance.   Dental advisory given  Plan Discussed with: CRNA  Anesthesia Plan Comments:         Anesthesia Quick Evaluation

## 2014-10-03 NOTE — Progress Notes (Signed)
Inpatient Diabetes Program Recommendations  AACE/ADA: New Consensus Statement on Inpatient Glycemic Control (2013)  Target Ranges:  Prepandial:   less than 140 mg/dL      Peak postprandial:   less than 180 mg/dL (1-2 hours)      Critically ill patients:  140 - 180 mg/dL   Results for SAYDE, TOP (MRN [id number]) as of 10/03/2014 10:54  Ref. Range 10/02/2014 04:53 10/02/2014 12:01 10/02/2014 16:47 10/02/2014 21:24 10/03/2014 00:47 10/03/2014 05:15 10/03/2014 08:02  Glucose-Capillary Latest Ref Range: 65-99 mg/dL 156 (H) 133 (H) 281 (H) 354 (H) 218 (H) 271 (H) 335 (H)    Diabetes history: DM2 Outpatient Diabetes medications: Lantus 18 units QHS, Novolog 5 units TID with meals Current orders for Inpatient glycemic control: Lantus 6 units QHS, Novolog 0-9 units Q4H  Inpatient Diabetes Program Recommendations Insulin - Basal: Glucose ranged from 133-354 mg/dl over the past 24 hours. Please consider increasing Lantus to 12 units QHS (based on 59 kg x 0.2 units).  Thanks, Barnie Alderman, RN, MSN, CCRN, CDE Diabetes Coordinator Inpatient Diabetes Program [phone] (Team Pager from [place] to [place]) [phone] ([facility] office) [phone] [facility] office) [phone] [facility] office)

## 2014-10-03 NOTE — Interval H&P Note (Signed)
History and Physical Interval Note:  10/03/2014 8:31 AM  Tracy [name]  has presented today for surgery, with the diagnosis of RIGHT THIGH ABSCESS  The various methods of treatment have been discussed with the patient and family. After consideration of risks, benefits and other options for treatment, the patient has consented to  Procedure(s): IRRIGATION AND DEBRIDEMENT RIGHT THIGH ABSCESS (Right) as a surgical intervention .  The patient's history has been reviewed, patient examined, no change in status, stable for surgery.  I have reviewed the patient's chart and labs.  Questions were answered to the patient's satisfaction.     MURPHY, TIMOTHY D

## 2014-10-03 NOTE — Progress Notes (Signed)
She reports that she feels well today she was up and walking yesterday with minimal pain other than some soreness in the back of her thigh. She reports that it is greatly improved from preoperatively. She denies any systemic symptoms.  On exam swelling is decreased significantly in her right lower extremity dressings are benign wound VAC is holding well she is neurovascularly intact to her right foot with all sensation positive tibia ant and gastrocsoleus EHL muscle function.  My plan today is to take her to the operating room for repeat irrigation and debridement with possible wound closure I will place a strip VAC but if we have an antibiotically planned and if her wound is clean we could consider discharge in the next day or 2. I will also perform a manipulation of her knee to help break up any scar tissue and maintain motion.    MURPHY, TIMOTHY D

## 2014-10-04 ENCOUNTER — Encounter (HOSPITAL_COMMUNITY): Payer: Self-pay | Admitting: Orthopedic Surgery

## 2014-10-04 LAB — BODY FLUID CULTURE: CULTURE: NO GROWTH

## 2014-10-04 LAB — CBC
HEMATOCRIT: 23 % — AB (ref 36.0–46.0)
Hemoglobin: 7.5 g/dL — ABNORMAL LOW (ref 12.0–15.0)
MCH: 28.3 pg (ref 26.0–34.0)
MCHC: 32.6 g/dL (ref 30.0–36.0)
MCV: 86.8 fL (ref 78.0–100.0)
Platelets: 261 10*3/uL (ref 150–400)
RBC: 2.65 MIL/uL — ABNORMAL LOW (ref 3.87–5.11)
RDW: 14.4 % (ref 11.5–15.5)
WBC: 7.4 10*3/uL (ref 4.0–10.5)

## 2014-10-04 LAB — RENAL FUNCTION PANEL
Albumin: 2.2 g/dL — ABNORMAL LOW (ref 3.5–5.0)
Anion gap: 10 (ref 5–15)
BUN: 31 mg/dL — AB (ref 6–20)
CHLORIDE: 105 mmol/L (ref 101–111)
CO2: 24 mmol/L (ref 22–32)
Calcium: 8.8 mg/dL — ABNORMAL LOW (ref 8.9–10.3)
Creatinine, Ser: 6.11 mg/dL — ABNORMAL HIGH (ref 0.44–1.00)
GFR calc Af Amer: 10 mL/min — ABNORMAL LOW (ref >60)
GFR calc non Af Amer: 8 mL/min — ABNORMAL LOW (ref >60)
GLUCOSE: 111 mg/dL — AB (ref 65–99)
POTASSIUM: 3.5 mmol/L (ref 3.5–5.1)
Phosphorus: 4.9 mg/dL — ABNORMAL HIGH (ref 2.5–4.6)
Sodium: 139 mmol/L (ref 135–145)

## 2014-10-04 LAB — GLUCOSE, CAPILLARY
GLUCOSE-CAPILLARY: 222 mg/dL — AB (ref 65–99)
GLUCOSE-CAPILLARY: 90 mg/dL (ref 65–99)
GLUCOSE-CAPILLARY: 91 mg/dL (ref 65–99)
Glucose-Capillary: 88 mg/dL (ref 65–99)
Glucose-Capillary: 100 mg/dL — ABNORMAL HIGH (ref 65–99)

## 2014-10-04 MED ORDER — PENTAFLUOROPROP-TETRAFLUOROETH EX AERO: 1.0000 "application " | INHALATION_SPRAY | CUTANEOUS | Status: DC | PRN

## 2014-10-04 MED ORDER — LIDOCAINE-PRILOCAINE 2.5-2.5 % EX CREA
1.0000 "application " | TOPICAL_CREAM | CUTANEOUS | Status: DC | PRN
  Filled 2014-10-04: qty 5

## 2014-10-04 MED ORDER — BOOST / RESOURCE BREEZE PO LIQD
1.0000 | Freq: Two times a day (BID) | ORAL | Status: DC
  Administered 2014-10-05: 1 via ORAL

## 2014-10-04 MED ORDER — ALTEPLASE 2 MG IJ SOLR
2.0000 mg | Freq: Once | INTRAMUSCULAR | Status: DC | PRN
  Filled 2014-10-04: qty 2

## 2014-10-04 MED ORDER — LIDOCAINE HCL (PF) 1 % IJ SOLN: 5.0000 mL | INTRAMUSCULAR | Status: DC | PRN

## 2014-10-04 MED ORDER — OXYCODONE HCL 5 MG PO TABS
ORAL_TABLET | ORAL | Status: AC
  Filled 2014-10-04: qty 2

## 2014-10-04 MED ORDER — HEPARIN SODIUM (PORCINE) 1000 UNIT/ML DIALYSIS: 1000.0000 [IU] | INTRAMUSCULAR | Status: DC | PRN

## 2014-10-04 MED ORDER — SODIUM CHLORIDE 0.9 % IV SOLN: 100.0000 mL | INTRAVENOUS | Status: DC | PRN

## 2014-10-04 MED ORDER — HEPARIN SODIUM (PORCINE) 1000 UNIT/ML DIALYSIS: 2000.0000 [IU] | INTRAMUSCULAR | Status: DC | PRN

## 2014-10-04 MED ORDER — NEPRO/CARBSTEADY PO LIQD
237.0000 mL | ORAL | Status: DC | PRN
  Filled 2014-10-04: qty 237

## 2014-10-04 NOTE — Progress Notes (Signed)
OT Cancellation Note  Patient Details Name: OTTA OSIPOV MRN: [id number] DOB: 25-Jun-1985   Cancelled Treatment:    Reason Eval/Treat Not Completed: Patient at procedure or test/ unavailable. Pt on dialysis unit. Will check back as able prior to pt discharge > home.   Ines Warf , MS, OTR/L, CLT Pager: [phone]  10/04/2014, 3:02 PM

## 2014-10-04 NOTE — Progress Notes (Signed)
Subjective:  Some leg discomfort / HD today   Objective Vital signs in last 24 hours: Filed Vitals:   10/03/14 2102 10/04/14 0130 10/04/14 0629 10/04/14 0700  BP: 176/63 160/72 159/63   Pulse: 100 93 96   Temp: 99.1 F (37.3 C) 98.4 F (36.9 C) 98 F (36.7 C)   TempSrc: Oral Oral Oral   Resp: _0 Height:      Weight:    68.947 kg (152 lb)  SpO2: 97% 99% 97%    Weight change:   Physical Exam: General: alert  NAD resting in bed.  Heart: RRR, no mur, rub or gallop Lungs: CTA, unlabored  Abdomen: soft, nontender +BS , nondistended Extremities: L no pedal edema/Trace R pedal edema with R Knee thigh dressing dry /clear Dialysis Access: R IJ perm cath. L AVF +bruit   OP Dialysis Orders: [facility] . EDW 62.5 and leaving below HD Bath 2k,2ca Time 4hrs Heparin 7600. Access R ij p.cath/ LUA AVF  mircera 50 mcg q2wks hd   Assessment/Plan: 1. ESRD - HD on normal schedule TTS- HD pending today  Check labs K 3.2 on po replacement . 2. Right Knee and Th ighpyomyositis SP surg drain  With wound vac on Ancef  admit team/ Ortho(dr. Percell Miller seeing / ID Cr. Megan Salon seeing  3. Hypertension/volume -159 /63 and lower edw at tolerated.  Amlodipine at 3m hs may decr as need for lowish bp 4. Anemia - hgb 9.1.> 8.5 on esa 1057mq thur  ^ 150 and fe weekly hd 5. Metabolic bone disease - Renvela binderon hold with Phos 1.9 reck labs today pre hd ( controlled as op phos  4.1) and no vit d/ with Constipation issues need to change binder if needed . 6. Nutrition - NPO for OR. renal vit .alb 2.6 7. IDDM Type 1 - per admt  DaErnest HaberPA-C CaSea [id number]/23/2016,10:29 AM  LOS: 7 days   Pt seen, examined and agree w A/P as above.  RoKelly SplinterD pager [phone]  cell [phone]/23/2016, 11:33 AM    Labs: Basic Metabolic Panel:  Recent Labs Lab 09/29/14 1057 09/30/14 0347 10/01/14 0409 10/01/14 2238 10/03/14 1307   NA 138 134* 136 136 137  K 3.5 3.9 4.5 3.4* 3.2*  CL 99* 97* 100* 98*  --   CO2 26 26 20* 21*  --   GLUCOSE 88 118* 119* 301* 158*  BUN 23* 10 23* 9  --   CREATININE 5.29* 3.20* 4.68* 2.24*  --   CALCIUM 9.2 9.2 9.0 8.2*  --   PHOS 4.2  --   --  1.9*  --    Liver Function Tests:  Recent Labs Lab 09/27/14 2025 09/28/14 0550 09/29/14 1057 10/01/14 2238  AST 24 18  --   --   ALT 6* <5*  --   --   ALKPHOS 97 78  --   --   BILITOT 0.5 0.2*  --   --   PROT 8.1 6.6  --   --   ALBUMIN 3.4* 2.7* 2.6* 2.5*   No results for input(s): LIPASE, AMYLASE in the last 168 hours. No results for input(s): AMMONIA in the last 168 hours. CBC:  Recent Labs Lab 09/27/14 2025 09/28/14 0550 09/29/14 1057 10/01/14 1619 10/01/14 2238 10/03/14 1307  WBC 8.7 5.7 10.3 6.8 6.5  --   NEUTROABS 6.9 3.5 8.5* 4.7  --   --  HGB 10.3* 9.1* 9.1* 7.9* 9.1* 8.5*  HCT 31.8* 28.7* 27.9* 24.5* 27.6* 25.0*  MCV 88.6 89.1 88.6 88.1 88.5  --   PLT 299 222 250 245 209  --    Cardiac Enzymes: No results for input(s): CKTOTAL, CKMB, CKMBINDEX, TROPONINI in the last 168 hours. CBG:  Recent Labs Lab 10/03/14 1657 10/03/14 2058 10/04/14 0012 10/04/14 0406 10/04/14 0804  GLUCAP 245* 324* 222* 91 88    Studies/Results: No results found. Medications: . sodium chloride     . amLODipine  10 mg Oral QHS  .  ceFAZolin (ANCEF) IV  2 g Intravenous Q T,Th,Sat-1800  . darbepoetin (ARANESP) injection - DIALYSIS  100 mcg Intravenous Q Thu-HD  . enoxaparin (LOVENOX) injection  30 mg Subcutaneous Q24H  . ferric gluconate (FERRLECIT/NULECIT) IV  125 mg Intravenous Q Thu-HD  . insulin aspart  0-9 Units Subcutaneous Q4H  . insulin glargine  10 Units Subcutaneous QHS  . levothyroxine  75 mcg Oral QAC breakfast  . multivitamin  1 tablet Oral QHS  . OxyCODONE  20 mg Oral Q12H  . polyethylene glycol  17 g Oral Daily  . senna-docusate  2 tablet Oral BID  . sodium chloride  500 mL Intravenous Once  . sodium  chloride  3 mL Intravenous Q12H

## 2014-10-04 NOTE — Progress Notes (Signed)
Physical Therapy Treatment Patient Details Name: Tracy [name] MRN: [id number] DOB: 1985-05-11 Today's Date: 10/04/2014    History of Present Illness Pt is a 29 y/o F scheduled for I and D of Rt thigh abscess tomorrow (10/02/14).  Pt's PMH includes thyroid enlargement, anemia, DM, anxiety, HSV-1&2, HTn.    PT Comments    Tracy [name] ambulated in hallway w/ supervision this session and participated in light therapeutic exercises sitting in recliner chair.  Pt is safe to d/c home from a mobility standpoint and suggesting that pt have OPPT once Rt thigh has reached appropriate stage of healing.  Pt will benefit from continued skilled PT services to increase functional independence and safety.   Follow Up Recommendations  Outpatient PT (OPPT once appropriate)     Equipment Recommendations  None recommended by PT (pt has RW at home)    Recommendations for Other Services       Precautions / Restrictions Precautions Precautions: Fall Restrictions Weight Bearing Restrictions: Yes RLE Weight Bearing: Weight bearing as tolerated    Mobility  Bed Mobility Overal bed mobility: Needs Assistance Bed Mobility: Supine to Sit     Supine to sit: Min assist     General bed mobility comments: Pt performs leg hook technique to achieve Rt LE off bed but requests assist to support Rt LE when pushing to sit up.  Transfers Overall transfer level: Needs assistance Equipment used: Rolling walker (2 wheeled) Transfers: Sit to/from Stand Sit to Stand: Min assist         General transfer comment: Pt requests min assist to support Rt LE while scooting back in chair.  Otherwise supervision for sit<>stand  Ambulation/Gait Ambulation/Gait assistance: Supervision Ambulation Distance (Feet): 200 Feet Assistive device: Rolling walker (2 wheeled) Gait Pattern/deviations: Step-to pattern;Ataxic;Trunk flexed;Decreased stride length;Decreased weight shift to right;Decreased stance time - right    Gait velocity interpretation: Below normal speed for age/gender General Gait Details: Increased WB through Bil UEs to offload Rt LE.  Cues to stand upright and to WBAT through Rt LE. Rt circumduction as pt does not want to flex Rt knee.   Stairs            Wheelchair Mobility    Modified Rankin (Stroke Patients Only)       Balance Overall balance assessment: Needs assistance Sitting-balance support: No upper extremity supported;Feet supported Sitting balance-Leahy Scale: Good     Standing balance support: Bilateral upper extremity supported;During functional activity Standing balance-Leahy Scale: Fair                      Cognition Arousal/Alertness: Awake/alert Behavior During Therapy: WFL for tasks assessed/performed Overall Cognitive Status: Within Functional Limits for tasks assessed                      Exercises General Exercises - Lower Extremity Ankle Circles/Pumps: AROM;Both;15 reps;Seated Heel Slides: AAROM;Right;5 reps;Seated;Limitations (only ~20 deg Rt knee flexion)    General Comments General comments (skin integrity, edema, etc.): Pt politely refuses to practice stairs, "I have it down pat".  Has been going up/down steps sideways.  Confirmed w/ pt that she is going up w/ her good leg and down w/ her injured leg.  Pt and mom both report pt has been performing safely at home w/o assist.      Pertinent Vitals/Pain Pain Assessment: 0-10 Pain Score: 9  Pain Location: Rt LE Pain Descriptors / Indicators: Throbbing Pain Intervention(s): Limited activity within patient's tolerance;Monitored during  session;Repositioned    Home Living                      Prior Function            PT Goals (current goals can now be found in the care plan section) Acute Rehab PT Goals Patient Stated Goal: get better and go home PT Goal Formulation: With patient Time For Goal Achievement: 10/16/14 Potential to Achieve Goals: Good Progress  towards PT goals: Progressing toward goals    Frequency  Min 3X/week    PT Plan Discharge plan needs to be updated    Co-evaluation             End of Session Equipment Utilized During Treatment: Gait belt Activity Tolerance: Patient limited by pain Patient left: in chair;with call bell/phone within reach;with family/visitor present     Time: [id number] PT Time Calculation (min) (ACUTE ONLY): 30 min  Charges:  $Gait Training: 23-37 mins                    G Codes:      Tracy [name] PT, [place] [id number] Pager: [phone] 10/04/2014, 12:44 PM

## 2014-10-04 NOTE — Progress Notes (Signed)
[name] PROGRESS NOTE  Tracy [name] [id number] DOB: 18-Jun-1985 DOA: 09/27/2014 PCP: [name] Interim summary: 29 y.o. female who was recently admitted last month for myositis an MSSA bacteremia presents with recurrent right lower extremity pain and tenderness. Orthopedics and ID consulted.  Assessment/Plan: 1. Right lower extremity pain with possible recurrence of myositis with abscess and phlegmon; Orthopedics consulted and underwent ultrasound guided aspiration of the abscess and sent for cultures. Meanwhile patient was restarted on cefazolin and Dr Megan Salon assisting [place] with antibiotics. So far, blood cultures have been negative .  Pain control and  She underwent  further debridement by orthopedics on 8/19. Wound vac was placed.   underwent Repeat I&D  by Dr Percell Miller 8/22. Removal of wound vac before discharge .     2. ESRD on hemodialysis - Tuesday Thursday and Saturday. Patient was recently started on dialysis last month. Consulted nephrology for dialysis.  3. Type 1 diabetes Mellitus: CBG (last 3)   Recent Labs  10/04/14 0406 10/04/14 0804 10/04/14 1126  GLUCAP 91 88 90    Resume SSI. Increased lantus to 10 units daily.      Anemia: Anemia of chronic disease and anemia of blood loss:  Monitor hemoglobin and transfuse if hemoglobin is less than 7.    Hypertension: Controlled.    Hypothyroidism: Resume synthroid.    Constipation: Senna, colace and miralax ordered.    Code Status: full code.  Family Communication: familly at bedside Disposition Plan: pending.    Consultants:  Radiology  ID  Renal.  Procedures: US guided aspiration of 2 sites of pain/inflammation of RLE.  1 = medial thigh, where there is post-op chages/scar. 2 = lateral knee. Fluid collection  Debridement of the abscess by orthopedics on 8/19  Antibiotics:  cefazolin  HPI/Subjective: Pain is moderate, no nausea, or vomiting.   Objective: Filed  Vitals:   10/04/14 1705  BP: 164/80  Pulse: 88  Temp: 98.5 F (36.9 C)  Resp:     Intake/Output Summary (Last 24 hours) at 10/04/14 1914 Last data filed at 10/04/14 1705  Gross per 24 hour  Intake    723 ml  Output   1208 ml  Net   -485 ml   Filed Weights   10/04/14 0700 10/04/14 1321 10/04/14 1705  Weight: 68.947 kg (152 lb) 62.2 kg (137 lb 2 oz) 60.8 kg (134 lb 0.6 oz)    Exam:   General:  Alert in no distress from pain .  Cardiovascular: s1s2  Respiratory: ctab  Abdomen: soft NT ND BS+  Musculoskeletal: RIGHT thigh swelling and tenderness.   Data Reviewed: Basic Metabolic Panel:  Recent Labs Lab 09/29/14 1057 09/30/14 0347 10/01/14 0409 10/01/14 2238 10/03/14 1307 10/04/14 1345  NA 138 134* 136 136 137 139  K 3.5 3.9 4.5 3.4* 3.2* 3.5  CL 99* 97* 100* 98*  --  105  CO2 26 26 20* 21*  --  24  GLUCOSE 88 118* 119* 301* 158* 111*  BUN 23* 10 23* 9  --  31*  CREATININE 5.29* 3.20* 4.68* 2.24*  --  6.11*  CALCIUM 9.2 9.2 9.0 8.2*  --  8.8*  PHOS 4.2  --   --  1.9*  --  4.9*   Liver Function Tests:  Recent Labs Lab 09/27/14 2025 09/28/14 0550 09/29/14 1057 10/01/14 2238 10/04/14 1345  AST 24 18  --   --   --   ALT 6* <5*  --   --   --  ALKPHOS 97 78  --   --   --   BILITOT 0.5 0.2*  --   --   --   PROT 8.1 6.6  --   --   --   ALBUMIN 3.4* 2.7* 2.6* 2.5* 2.2*   No results for input(s): LIPASE, AMYLASE in the last 168 hours. No results for input(s): AMMONIA in the last 168 hours. CBC:  Recent Labs Lab 09/27/14 2025 09/28/14 0550 09/29/14 1057 10/01/14 1619 10/01/14 2238 10/03/14 1307 10/04/14 1345  WBC 8.7 5.7 10.3 6.8 6.5  --  7.4  NEUTROABS 6.9 3.5 8.5* 4.7  --   --   --   HGB 10.3* 9.1* 9.1* 7.9* 9.1* 8.5* 7.5*  HCT 31.8* 28.7* 27.9* 24.5* 27.6* 25.0* 23.0*  MCV 88.6 89.1 88.6 88.1 88.5  --  86.8  PLT 299 222 250 245 209  --  261   Cardiac Enzymes: No results for input(s): CKTOTAL, CKMB, CKMBINDEX, TROPONINI in the last 168  hours. BNP (last 3 results) No results for input(s): BNP in the last 8760 hours.  ProBNP (last 3 results) No results for input(s): PROBNP in the last 8760 hours.  CBG:  Recent Labs Lab 10/03/14 2058 10/04/14 0012 10/04/14 0406 10/04/14 0804 10/04/14 1126  GLUCAP 324* 222* 91 88 90    Recent Results (from the past 240 hour(s))  Surgical pcr screen     Status: None   Collection Time: 09/28/14  1:31 AM  Result Value Ref Range Status   MRSA, PCR NEGATIVE NEGATIVE Final   Staphylococcus aureus NEGATIVE NEGATIVE Final    Comment:        The Xpert SA Assay (FDA approved for NASAL specimens in patients over 28 years of age), is one component of a comprehensive surveillance program.  Test performance has been validated by [facility] for patients greater than or equal to 31 year old. It is not intended to diagnose infection nor to guide or monitor treatment.   Culture, routine-abscess     Status: None   Collection Time: 09/28/14  5:30 PM  Result Value Ref Range Status   Specimen Description ABSCESS RIGHT KNEE  Final   Special Requests NO2  Final   Gram Stain   Final    RARE WBC PRESENT, PREDOMINANTLY PMN NO SQUAMOUS EPITHELIAL CELLS SEEN NO ORGANISMS SEEN Performed at [facility]    Culture   Final    NO GROWTH 3 DAYS Performed at [facility]    Report Status 10/02/2014 FINAL  Final  Culture, routine-abscess     Status: None   Collection Time: 09/28/14  5:30 PM  Result Value Ref Range Status   Specimen Description ABSCESS LEFT THIGH  Final   Special Requests NO1  Final   Gram Stain   Final    RARE WBC PRESENT, PREDOMINANTLY PMN NO SQUAMOUS EPITHELIAL CELLS SEEN NO ORGANISMS SEEN Performed at [facility]    Culture   Final    NO GROWTH 3 DAYS Performed at [facility]    Report Status 10/02/2014 FINAL  Final  Anaerobic culture     Status: None (Preliminary result)   Collection Time: 09/30/14  5:48 PM  Result Value Ref  Range Status   Specimen Description SYNOVIAL RIGHT KNEE  Final   Special Requests NONE  Final   Gram Stain   Final    WBC PRESENT,BOTH PMN AND MONONUCLEAR NO ORGANISMS SEEN CYTOSPIN SMEAR    Culture   Final  NO ANAEROBES ISOLATED; CULTURE IN PROGRESS FOR 5 DAYS   Report Status PENDING  Incomplete  Body fluid culture     Status: None   Collection Time: 09/30/14  5:48 PM  Result Value Ref Range Status   Specimen Description SYNOVIAL RIGHT KNEE  Final   Special Requests NONE  Final   Gram Stain   Final    CYTOSPIN WBC PRESENT,BOTH PMN AND MONONUCLEAR NO ORGANISMS SEEN    Culture NO GROWTH 3 DAYS  Final   Report Status 10/04/2014 FINAL  Final  Tissue culture     Status: None (Preliminary result)   Collection Time: 09/30/14  6:12 PM  Result Value Ref Range Status   Specimen Description TISSUE RIGHT THIGH  Final   Special Requests NONE  Final   Gram Stain   Final    ABUNDANT WBC PRESENT,BOTH PMN AND MONONUCLEAR NO ORGANISMS SEEN Performed at [name] Performed at [facility]    Culture PENDING  Incomplete   Report Status PENDING  Incomplete  Gram stain     Status: None   Collection Time: 09/30/14  6:12 PM  Result Value Ref Range Status   Specimen Description TISSUE RIGHT THIGH  Final   Special Requests NONE  Final   Gram Stain   Final    ABUNDANT WBC PRESENT,BOTH PMN AND MONONUCLEAR NO ORGANISMS SEEN    Report Status 09/30/2014 FINAL  Final     Studies: No results found.  Scheduled Meds: . amLODipine  10 mg Oral QHS  .  ceFAZolin (ANCEF) IV  2 g Intravenous Q T,Th,Sat-1800  . darbepoetin (ARANESP) injection - DIALYSIS  100 mcg Intravenous Q Thu-HD  . enoxaparin (LOVENOX) injection  30 mg Subcutaneous Q24H  . feeding supplement  1 Container Oral BID BM  . ferric gluconate (FERRLECIT/NULECIT) IV  125 mg Intravenous Q Thu-HD  . insulin aspart  0-9 Units Subcutaneous Q4H  . insulin glargine  10 Units Subcutaneous QHS  . levothyroxine  75 mcg  Oral QAC breakfast  . multivitamin  1 tablet Oral QHS  . oxyCODONE      . OxyCODONE  20 mg Oral Q12H  . polyethylene glycol  17 g Oral Daily  . senna-docusate  2 tablet Oral BID  . sodium chloride  500 mL Intravenous Once  . sodium chloride  3 mL Intravenous Q12H   Continuous Infusions: . sodium chloride      Principal Problem:   Abscess of right thigh Active Problems:   Hypothyroidism   Myositis   DM I (diabetes mellitus, type I), uncontrolled   Essential hypertension   Chronic anemia   Lower extremity pain, right    Time spent: 25 minutes    [place] [facility] Pager [phone]. If 7PM-7AM, please contact night-coverage at [email or web address], password [facility] 10/04/2014, 7:14 PM  LOS: 7 days

## 2014-10-04 NOTE — Progress Notes (Signed)
Her pain is improving significantly she did have some soreness overnight B feels as though her leg is improving. She denies any systemic symptoms.  On exam her right lower extremity compartments are soft suction dressing intact drain with minimal output distally neurovascularly intact.  Plan:  She is okay for disc bow from an orthopedic standpoint as long as she continues to improve clinically once we have an IV Anna biotics plan. Her suction dressing and Hemovac drain can be removed prior to DC.  Weightbearing as tolerated.   MURPHY, TIMOTHY D

## 2014-10-05 DIAGNOSIS — M79604 Pain in right leg: Secondary | ICD-10-CM

## 2014-10-05 DIAGNOSIS — D649 Anemia, unspecified: Secondary | ICD-10-CM

## 2014-10-05 DIAGNOSIS — I10 Essential (primary) hypertension: Secondary | ICD-10-CM

## 2014-10-05 DIAGNOSIS — E039 Hypothyroidism, unspecified: Secondary | ICD-10-CM

## 2014-10-05 DIAGNOSIS — E1065 Type 1 diabetes mellitus with hyperglycemia: Secondary | ICD-10-CM

## 2014-10-05 DIAGNOSIS — L02415 Cutaneous abscess of right lower limb: Principal | ICD-10-CM

## 2014-10-05 DIAGNOSIS — M609 Myositis, unspecified: Secondary | ICD-10-CM

## 2014-10-05 LAB — BASIC METABOLIC PANEL WITH GFR
Anion gap: 12 (ref 5–15)
BUN: 12 mg/dL (ref 6–20)
CO2: 25 mmol/L (ref 22–32)
Calcium: 9.1 mg/dL (ref 8.9–10.3)
Chloride: 99 mmol/L — ABNORMAL LOW (ref 101–111)
Creatinine, Ser: 3.82 mg/dL — ABNORMAL HIGH (ref 0.44–1.00)
GFR calc Af Amer: 17 mL/min — ABNORMAL LOW
GFR calc non Af Amer: 15 mL/min — ABNORMAL LOW
Glucose, Bld: 156 mg/dL — ABNORMAL HIGH (ref 65–99)
Potassium: 4.4 mmol/L (ref 3.5–5.1)
Sodium: 136 mmol/L (ref 135–145)

## 2014-10-05 LAB — CBC
HEMATOCRIT: 24.9 % — AB (ref 36.0–46.0)
Hemoglobin: 8.1 g/dL — ABNORMAL LOW (ref 12.0–15.0)
MCH: 28.6 pg (ref 26.0–34.0)
MCHC: 32.5 g/dL (ref 30.0–36.0)
MCV: 88 fL (ref 78.0–100.0)
PLATELETS: 256 10*3/uL (ref 150–400)
RBC: 2.83 MIL/uL — AB (ref 3.87–5.11)
RDW: 14.1 % (ref 11.5–15.5)
WBC: 7.1 10*3/uL (ref 4.0–10.5)

## 2014-10-05 LAB — ANAEROBIC CULTURE

## 2014-10-05 LAB — GLUCOSE, CAPILLARY
GLUCOSE-CAPILLARY: 151 mg/dL — AB (ref 65–99)
Glucose-Capillary: 101 mg/dL — ABNORMAL HIGH (ref 65–99)
Glucose-Capillary: 42 mg/dL — CL (ref 65–99)
Glucose-Capillary: 80 mg/dL (ref 65–99)
Glucose-Capillary: 96 mg/dL (ref 65–99)

## 2014-10-05 MED ORDER — CEFAZOLIN SODIUM-DEXTROSE 2-3 GM-% IV SOLR: 2.0000 g | INTRAVENOUS | Status: DC

## 2014-10-05 MED ORDER — POLYETHYLENE GLYCOL 3350 17 G PO PACK: 17.0000 g | PACK | Freq: Every day | ORAL | Status: DC

## 2014-10-05 MED ORDER — OXYCODONE-ACETAMINOPHEN 5-325 MG PO TABS: 2.0000 | ORAL_TABLET | ORAL | Status: DC | PRN

## 2014-10-05 MED ORDER — OXYCODONE HCL ER 20 MG PO T12A: 20.0000 mg | EXTENDED_RELEASE_TABLET | Freq: Two times a day (BID) | ORAL | Status: DC

## 2014-10-05 MED ORDER — SENNOSIDES-DOCUSATE SODIUM 8.6-50 MG PO TABS: 2.0000 | ORAL_TABLET | Freq: Two times a day (BID) | ORAL | Status: DC

## 2014-10-05 MED ORDER — DIAZEPAM 5 MG PO TABS: 5.0000 mg | ORAL_TABLET | Freq: Four times a day (QID) | ORAL | Status: DC | PRN

## 2014-10-05 MED ORDER — MORPHINE SULFATE (PF) 2 MG/ML IV SOLN: 2.0000 mg | INTRAVENOUS | Status: DC | PRN

## 2014-10-05 MED ORDER — BISACODYL 10 MG RE SUPP: 10.0000 mg | Freq: Every day | RECTAL | Status: DC | PRN

## 2014-10-05 NOTE — Progress Notes (Signed)
Her pain is improving  I pulled her drain today Remove suction dressing and replace with dry dressing prior to D/c  I am ok with D/c when abx plan is in place.    I placed rx for pain and anxiolytic in chart F/u with me on Friday at 8:30am    Renette Butters Cell [phone]

## 2014-10-05 NOTE — Care Management Note (Signed)
Case Management Note  Patient Details  Name: Tracy [name] MRN: [id number] Date of Birth: 1985/04/18  Subjective/Objective:       Right thigh abscess, s/p I and D x2             Action/Plan: Wound Vac removed prior to discharge and patient to have IV antibiotics during hemodialysis. PT recommended outpatient therapy, contacted [facility] and obtained rolling walker for patient. Patient stated that she will have family to assist after discharge.     Expected Discharge Date:                  Expected Discharge Plan:  Home/Self Care  In-House Referral:  NA  Discharge planning Services  CM Consult  Post Acute Care Choice:  Durable Medical Equipment Choice offered to:     DME Arranged:  Walker rolling DME Agency:  [place]:    [facility] Agency:     Status of Service:  In process, will continue to follow  Medicare Important Message Given:    Date Medicare IM Given:    Medicare IM give by:    Date Additional Medicare IM Given:    Additional Medicare Important Message give by:     If discussed at [place] of Stay Meetings, dates discussed:    Additional Comments:  Nila Nephew, RN 10/05/2014, 2:10 PM

## 2014-10-05 NOTE — Discharge Summary (Addendum)
Physician Discharge Summary  Tracy [name] [id number] DOB: [birth year] DOA: 09/27/2014  PCP: [name]  Admit date: 09/27/2014 Discharge date: 10/05/2014  Time spent: 45 minutes  Recommendations for Outpatient Follow-up:  Patient will be discharged to home, with outpatient physical therapy.  Patient will need to follow up with primary care provider within one week of discharge.  Patient will also need follow-up with Dr. Edmonia [name], on 10/07/2014 8:30 AM. Patient will also need follow up with Dr. Michel [name], infectious disease. Patient should continue medications as prescribed.  Patient should follow a renal/scar modified diet. Continue hemodialysis as scheduled.  Discharge Diagnoses:  Right lower extremity pain, my ascites/abscess and phlegmon End-stage renal disease Diabetes mellitus, type I Anemia secondary chronic disease Essential hypertension Hypothyroidism Constipation  Discharge Condition: Stable  Diet recommendation: Renal/carb modified  Filed Weights   10/04/14 1321 10/04/14 1705 10/05/14 0500  Weight: 62.2 kg (137 lb 2 oz) 60.8 kg (134 lb 0.6 oz) 72.8 kg (160 lb 7.9 oz)    History of present illness:  On 09/28/2014 by Dr. Gean [name] Tracy [name] is a 29 y.o. female who was recently admitted last month for myositis and was found to have MSSA bacteremia and had finished course of antibiotics started experiencing right lower extremity pain last week. Patient hadn't followed up with orthopedic surgery and infectious disease and a head MRI done at an outside facility and as per the notes from infectious disease consultant was showing loculated fluid collection concerning for phlegmon. Patient was advised to come to the ER for further surgical debridement and management. Patient states the pain is being getting worsen. Presently patient does not look septic. Patient has been restarted on cefazolin by infectious disease and has received these doses  during the dialysis. Patient otherwise denies any nausea vomiting abdominal pain that get chest pain or shortness of breath. Patient gets dialyzed on Tuesday Thursdays and Saturday.   Hospital Course:  Right lower extremity pain with possible recurrence of myositis/abscess and phlegmon -Orthopedics consulted and appreciated, patient underwent ultrasound-guided aspiration of the abscess; as well as repeat I&D on 8/22 -Infectious disease also consulted and appreciated.  Dr. Megan Salon recommended 6 weeks of Ancef -Patient will receive in the biotics along with hemodialysis -Patient also had wound VAC which will be removed before discharge -Cultures have shown no growth to date -Section dressing removed today, replace with dry dressing -Patient to follow-up with Dr. Edmonia [name], orthopedics on Friday, 10/07/2014 at 8:30 AM -Patient will also need to follow up with Dr. Megan Salon.    End-stage renal disease, on hemodialysis -Nephrology consultation appreciated -Continue dialysis TTS  Anemia secondary to chronic disease -hemoglobin 8.1 -Continue iron replacement and monitoring hemoglobin with dialysis  Diabetes mellitus, type I -Continue Lantus and insulin sliding scale  Hypertension -Continue amlodipine  Hypothyroidism -Continue Synthroid  Constipation -Continue senna, Colace and MiraLAX  Procedures: US guided aspiration of 2 sites of pain/inflammation of RLE.  1 = medial thigh, where there is post-op chages/scar. 2 = lateral knee. Fluid collection  Debridement of the abscess by orthopedics on 8/19  Consultations: Interventional radiology Orthopedics Infectious disease Nephrology  Discharge Exam: Filed Vitals:   10/05/14 0419  BP: 139/64  Pulse: 91  Temp: 98.3 F (36.8 C)  Resp: 16     General: Well developed, well nourished, no apparent distress  HEENT: NCAT,  mucous membranes moist.  Cardiovascular: S1 S2 auscultated, no rubs, murmurs or gallops. Regular  rate and rhythm.  Respiratory: Clear to auscultation bilaterally  with equal chest rise  Abdomen: Soft, nontender, nondistended, + bowel sounds  Extremities: warm dry without cyanosis clubbing. RLE wrapped.  Trace RLE edema  Neuro: AAOx3, nonfocal  Discharge Instructions      Discharge Instructions    Discharge instructions    Complete by:  As directed   Patient will be discharged to home.  Patient will need to follow up with primary care provider within one week of discharge.  Patient will also need follow-up with Dr. Edmonia [name], on 10/07/2014 8:30 AM. Patient will also need follow up with Dr. Michel [name], infectious disease. Patient should continue medications as prescribed.  Patient should follow a renal/scar modified diet. Continue hemodialysis as scheduled.            Medication List    STOP taking these medications        acyclovir 400 MG tablet  Commonly known as:  ZOVIRAX     cephALEXin 500 MG capsule  Commonly known as:  KEFLEX     HYDROcodone-acetaminophen 5-325 MG per tablet  Commonly known as:  NORCO/VICODIN     megestrol 40 MG/ML suspension  Commonly known as:  MEGACE     promethazine 12.5 MG tablet  Commonly known as:  PHENERGAN     zolpidem 5 MG tablet  Commonly known as:  AMBIEN      TAKE these medications        amLODipine 10 MG tablet  Commonly known as:  NORVASC  Take 10 mg by mouth daily.     barrier cream Crea  Commonly known as:  non-specified  Apply 1 application topically 2 (two) times daily as needed.     bisacodyl 10 MG suppository  Commonly known as:  DULCOLAX  Place 1 suppository (10 mg total) rectally daily as needed for moderate constipation.     calcitRIOL 0.5 MCG capsule  Commonly known as:  ROCALTROL  Take 1 capsule (0.5 mcg total) by mouth daily.     ceFAZolin 2-3 GM-% Solr  Commonly known as:  ANCEF  Inject 50 mLs (2 g total) into the vein every Tuesday, Thursday, and Saturday at 6 PM.     diazepam 5 MG tablet   Commonly known as:  VALIUM  Take 1 tablet (5 mg total) by mouth every 6 (six) hours as needed for anxiety or muscle spasms.     insulin aspart 100 UNIT/ML FlexPen  Commonly known as:  NOVOLOG FLEXPEN  Inject 7 Units into the skin 3 (three) times daily with meals.     Insulin Glargine 100 UNIT/ML Solostar Pen  Commonly known as:  LANTUS SOLOSTAR  Inject 28 Units into the skin at bedtime.     levothyroxine 88 MCG tablet  Commonly known as:  SYNTHROID, LEVOTHROID  Take 1 tablet (88 mcg total) by mouth daily before breakfast.     levothyroxine 75 MCG tablet  Commonly known as:  SYNTHROID, LEVOTHROID  Take 75 mcg by mouth daily.     multivitamin Tabs tablet  Take 1 tablet by mouth at bedtime.     OxyCODONE 20 mg T12a 12 hr tablet  Commonly known as:  OXYCONTIN  Take 1 tablet (20 mg total) by mouth every 12 (twelve) hours.     oxyCODONE-acetaminophen 5-325 MG per tablet  Commonly known as:  ROXICET  Take 2 tablets by mouth every 4 (four) hours as needed.     polyethylene glycol packet  Commonly known as:  MIRALAX / GLYCOLAX  Take 17 g by mouth  daily.     senna-docusate 8.6-50 MG per tablet  Commonly known as:  Senokot-S  Take 2 tablets by mouth 2 (two) times daily.     sevelamer carbonate 800 MG tablet  Commonly known as:  RENVELA  Take 800 mg by mouth 3 (three) times daily with meals.       Allergies  Allergen Reactions  . Penicillins Hives and Swelling  . Sulfa Antibiotics Hives   Follow-up Information    Follow up with Renette Butters, MD.   Specialty:  Orthopedic Surgery   Why:  friday 8:30am   Contact information:   [address] [place] [address] [phone]       Follow up with JONES,ERIN, PA-C. Schedule an appointment as soon as possible for a visit in 1 week.   Specialty:  Family Medicine   Contact information:   [place] [address] [phone]        The results of significant diagnostics from this  hospitalization (including imaging, microbiology, ancillary and laboratory) are listed below for reference.    Significant Diagnostic Studies: [place] Extrem Low Right Ltd  09/28/2014   CLINICAL DATA:  29 year old female with a history of myositis. She has inflammatory changes at a site of prior surgical incision, as well as knee tenderness and inflammation.  EXAM: ULTRASOUND right LOWER EXTREMITY LIMITED  TECHNIQUE: Ultrasound examination of the lower extremity soft tissues was performed in the area of clinical concern.  COMPARISON:  MRI 08/12/2014  FINDINGS: Targeted ultrasound in 2 separate regions of clinical concern along the medial thigh and at the lateral knee. Grayscale and color imaging performed.  There is a heterogeneously echoic, hypoechoic region in the medial thigh a near at a region of prior surgery. Largest collection at this site measures 3.4 cm x 1.0 cm, just subjacent to the skin surface.  There is a anechoic fluid collection with through transmission at the lateral knee without internal flow measuring 4.4 cm  IMPRESSION: Focal fluid collection at the lateral right knee, may represent hematoma, seroma, or abscess. In addition there is a focal region at the medial thigh and, underlying prior surgical incision with more complex features, potentially representing old hematoma, seroma, or abscess. Image guided aspiration appears possible.  Signed,  Dulcy Fanny. Earleen Newport, DO  [facility]  [facility]   Electronically Signed   By: Corrie Mckusick D.O.   On: 09/28/2014 15:47   US Aspiration  09/28/2014   CLINICAL DATA:  29 year old female with a history of myositis.  The patient has had a recent right-sided incision and drainage of the medial thigh, and now presents with tenderness at this site as well as inflammation/tenderness at the right knee.  Prior MRI demonstrates myositis of the thigh musculature, as well as fluid collection at the knee.  She has been  referred for image guided sampling.  EXAM: ULTRASOUND GUIDED ASPIRATION BIOPSY OF MEDIAL RIGHT THIGH FLUID COLLECTION/PHLEGMON, AND LATERAL RIGHT KNEE FLUID COLLECTION  MEDICATIONS: No sedation  PROCEDURE: The procedure, risks, benefits, and alternatives were explained to the patient. Questions regarding the procedure were encouraged and answered. The patient understands and consents to the procedure.  Ultrasound survey was performed with images stored and sent to PACs.  Medial thigh:  The medial thigh was prepped with Betadine in a sterile fashion, and a sterile drape was applied covering the operative field. A sterile gown and sterile gloves were used for the procedure. Local anesthesia was  provided with 1% Lidocaine.  Once the patient is prepped and draped sterilely, the skin and subcutaneous tissues were generously infiltrated with 1% lidocaine. Using ultrasound guidance, a Yueh needle was advanced into the region of the abnormal soft tissue/collection with the lowest echogenicity. Aspiration was attempted, with only 1-2 cc of thin yellow fluid aspirated. The sample was sent for analysis.  The Yueh catheter was removed and a sterile dressing was placed.  Lateral knee:  The lateral knee was prepped with chlorhexidine in a sterile fashion, and a sterile drape was applied covering the operative field. A sterile gown and sterile gloves were used for the procedure. Local anesthesia was provided with 1% Lidocaine.  Once the patient was prepped and draped sterilely, the skin and subcutaneous tissues were generously infiltrated with 1% lidocaine. Using ultrasound guidance, a Yueh needle was advanced into the anechoic fluid collection of the lateral knee. It is difficult to determine whether this fluid collection was connected with the joint space under ultrasound.  Approximately 10 cc-15 cc of turbid yellow fluid was aspirated. Sample was sent to the lab for analysis.  The Yueh catheter was removed and a sterile  dressing was placed.  Patient tolerated the procedure well and remained hemodynamically stable throughout.  No complications were encountered and no significant blood loss was encountered.  COMPLICATIONS: None.  FINDINGS: Ultrasound survey of the medial right thigh demonstrates complex heterogeneous collection, which was difficult aspirate. Only 1-2 cc of fluid was aspirated. This is felt to represent phlegmonous change or postoperative changes. No significant fluid collection was present.  Ultrasound survey of the lateral right knee demonstrates anechoic fluid collection. It is difficult to determine whether this connects with the joint space, though this is possible.  Approximately 10-15 cc of turbid yellow fluid was aspirated.  IMPRESSION: Status post ultrasound-guided sampling of medial right thigh and lateral right knee fluid collections. Sample was sent to the lab for analysis from each site.  No significant low viscosity fluid was present at the right medial thigh, with the ultrasound appearance felt to represent organized hematoma and/or phlegmon.  Signed,  Dulcy Fanny. Earleen Newport, DO  [facility]  [facility]   Electronically Signed   By: Corrie Mckusick D.O.   On: 09/28/2014 18:01    Microbiology: Recent Results (from the past 240 hour(s))  Surgical pcr screen     Status: None   Collection Time: 09/28/14  1:31 AM  Result Value Ref Range Status   MRSA, PCR NEGATIVE NEGATIVE Final   Staphylococcus aureus NEGATIVE NEGATIVE Final    Comment:        The Xpert SA Assay (FDA approved for NASAL specimens in patients over 57 years of age), is one component of a comprehensive surveillance program.  Test performance has been validated by [facility] for patients greater than or equal to 37 year old. It is not intended to diagnose infection nor to guide or monitor treatment.   Culture, routine-abscess     Status: None   Collection Time: 09/28/14  5:30 PM   Result Value Ref Range Status   Specimen Description ABSCESS RIGHT KNEE  Final   Special Requests NO2  Final   Gram Stain   Final    RARE WBC PRESENT, PREDOMINANTLY PMN NO SQUAMOUS EPITHELIAL CELLS SEEN NO ORGANISMS SEEN Performed at [facility]    Culture   Final    NO GROWTH 3 DAYS Performed at [facility]    Report Status 10/02/2014  FINAL  Final  Culture, routine-abscess     Status: None   Collection Time: 09/28/14  5:30 PM  Result Value Ref Range Status   Specimen Description ABSCESS LEFT THIGH  Final   Special Requests NO1  Final   Gram Stain   Final    RARE WBC PRESENT, PREDOMINANTLY PMN NO SQUAMOUS EPITHELIAL CELLS SEEN NO ORGANISMS SEEN Performed at [facility]    Culture   Final    NO GROWTH 3 DAYS Performed at [facility]    Report Status 10/02/2014 FINAL  Final  Anaerobic culture     Status: None   Collection Time: 09/30/14  5:48 PM  Result Value Ref Range Status   Specimen Description SYNOVIAL RIGHT KNEE  Final   Special Requests NONE  Final   Gram Stain   Final    WBC PRESENT,BOTH PMN AND MONONUCLEAR NO ORGANISMS SEEN CYTOSPIN SMEAR    Culture NO ANAEROBES ISOLATED  Final   Report Status 10/05/2014 FINAL  Final  Body fluid culture     Status: None   Collection Time: 09/30/14  5:48 PM  Result Value Ref Range Status   Specimen Description SYNOVIAL RIGHT KNEE  Final   Special Requests NONE  Final   Gram Stain   Final    CYTOSPIN WBC PRESENT,BOTH PMN AND MONONUCLEAR NO ORGANISMS SEEN    Culture NO GROWTH 3 DAYS  Final   Report Status 10/04/2014 FINAL  Final  Tissue culture     Status: None (Preliminary result)   Collection Time: 09/30/14  6:12 PM  Result Value Ref Range Status   Specimen Description TISSUE RIGHT THIGH  Final   Special Requests NONE  Final   Gram Stain   Final    ABUNDANT WBC PRESENT,BOTH PMN AND MONONUCLEAR NO ORGANISMS SEEN Performed at [facility] Performed at [facility]    Culture PENDING  Incomplete   Report Status PENDING  Incomplete  Gram stain     Status: None   Collection Time: 09/30/14  6:12 PM  Result Value Ref Range Status   Specimen Description TISSUE RIGHT THIGH  Final   Special Requests NONE  Final   Gram Stain   Final    ABUNDANT WBC PRESENT,BOTH PMN AND MONONUCLEAR NO ORGANISMS SEEN    Report Status 09/30/2014 FINAL  Final     Labs: Basic Metabolic Panel:  Recent Labs Lab 09/29/14 1057 09/30/14 0347 10/01/14 0409 10/01/14 2238 10/03/14 1307 10/04/14 1345  NA 138 134* 136 136 137 139  K 3.5 3.9 4.5 3.4* 3.2* 3.5  CL 99* 97* 100* 98*  --  105  CO2 26 26 20* 21*  --  24  GLUCOSE 88 118* 119* 301* 158* 111*  BUN 23* 10 23* 9  --  31*  CREATININE 5.29* 3.20* 4.68* 2.24*  --  6.11*  CALCIUM 9.2 9.2 9.0 8.2*  --  8.8*  PHOS 4.2  --   --  1.9*  --  4.9*   Liver Function Tests:  Recent Labs Lab 09/29/14 1057 10/01/14 2238 10/04/14 1345  ALBUMIN 2.6* 2.5* 2.2*   No results for input(s): LIPASE, AMYLASE in the last 168 hours. No results for input(s): AMMONIA in the last 168 hours. CBC:  Recent Labs Lab 09/29/14 1057 10/01/14 1619 10/01/14 2238 10/03/14 1307 10/04/14 1345  WBC 10.3 6.8 6.5  --  7.4  NEUTROABS 8.5* 4.7  --   --   --   HGB 9.1* 7.9* 9.1*  8.5* 7.5*  HCT 27.9* 24.5* 27.6* 25.0* 23.0*  MCV 88.6 88.1 88.5  --  86.8  PLT 250 245 209  --  261   Cardiac Enzymes: No results for input(s): CKTOTAL, CKMB, CKMBINDEX, TROPONINI in the last 168 hours. BNP: BNP (last 3 results) No results for input(s): BNP in the last 8760 hours.  ProBNP (last 3 results) No results for input(s): PROBNP in the last 8760 hours.  CBG:  Recent Labs Lab 10/04/14 2146 10/05/14 0059 10/05/14 0421 10/05/14 0503 10/05/14 0743  GLUCAP 100* 101* 42* 80 96       Signed:  Tyreque Finken  [name] 10/05/2014, 11:38 AM

## 2014-10-05 NOTE — Progress Notes (Signed)
All discharge teachings done with pt and mother. Pt will make follow up appt with primary care provider. Pt declines this RN making appt for her. Pt is aware of her follow up appts with Dr. Percell Miller and Dr. Megan Salon as well as appts for dialysis Tues, Thurs and Sat. Wound vac removed per MD order and replaced with a dry dressing. Pt tolerated well and was able to verbalize back how to perform a dry dressing. Pt and mother states they have plenty of supplies for dressing change at home. Upon removal of vac, very little drainage noted. 4 incisions noted. One mid R upper thgh and two lower R inner thigh and one lateral R upper thigh. All of these incisions were well approximated with stitches. No s/sx infection noted. R upper thigh still swollen but pt states that from her last vac change reddness and swelling is "much better". Pt and mother verbalize understanding of all discharge teachings and agree to comply. Prescriptions with instructions given. Pt discharged to home via wheelchair without incident accompanied by mother.

## 2014-10-05 NOTE — Progress Notes (Signed)
Occupational Therapy Treatment Patient Details Name: Tracy [name] MRN: [id number] DOB: Apr 04, 1985 Today's Date: 10/05/2014    History of present illness Pt is a 29 y/o F scheduled for I and D of Rt thigh abscess tomorrow (10/02/14).  Pt's PMH includes thyroid enlargement, anemia, DM, anxiety, HSV-1&2, HTn.   OT comments  Patient progressing towards goals, but continues to be limited by pain and decreased activity tolerance/endurance. Administered energy conservation (6 page) handout. Pt states she does not need any DME, however she does want a RW of her own, RN stated she would put in order for this. Pt also stated she did not want OPPT, instead HHPT; will notify PT of this.    Follow Up Recommendations  No OT follow up;Supervision/Assistance - 24 hour    Equipment Recommendations  None recommended by OT (Mother reports she has a stool for in the shower and elevated toilet seats with window Miy[place] on side to assist with sit<>stands.)    Recommendations for Other Services  None at this time   Precautions / Restrictions Precautions Precautions: Fall Restrictions Weight Bearing Restrictions: Yes RLE Weight Bearing: Weight bearing as tolerated    Mobility Bed Mobility General bed mobility comments: Did not occur, pt found seated in recliner upon OT entering/exiting room  Transfers General transfer comment: Did not occur secondary to patient refusal        ADL Overall ADL's : Needs assistance/impaired General ADL Comments: Upon entering room, pt seated in recliner after mother assisted with bathing and dressing. Pt with increased pain and seemed to be fatigued from ADL routine. Pt refused any type of mobility at this time stating "It's not happening right now!". Therapist adminsiterred energy conservation handout from education material. Discussed importance of seated rest breaks and encouraged patient to read thru packet. Mother took packet from therapist.      Cognition    Behavior During Therapy: WFL for tasks assessed/performed Overall Cognitive Status: Within Functional Limits for tasks assessed                 Pertinent Vitals/ Pain       Pain Assessment: Faces Faces Pain Scale: Hurts whole lot Pain Location: RLE Pain Descriptors / Indicators: Discomfort;Grimacing;Guarding Pain Intervention(s): Limited activity within patient's tolerance   Frequency Min 2X/week     Progress Toward Goals  OT Goals(current goals can now befound in the care plan section)  Progress towards OT goals: Progressing toward goals     Plan Discharge plan remains appropriate    End of Session     Activity Tolerance Patient limited by pain   Patient Left in chair;with call bell/phone within reach;with family/visitor present    Time: 0913-0928 OT Time Calculation (min): 15 min  Charges: OT General Charges $OT Visit: 1 Procedure OT Treatments $Self Care/Home Management : 8-22 mins  Json Koelzer , MS, OTR/L, CLT Pager: [phone]  10/05/2014, 9:43 AM

## 2014-10-05 NOTE — Discharge Instructions (Signed)
Keep your incisions covered and dry   Abscess An abscess (boil or furuncle) is an infected area on or under the skin. This area is filled with yellowish-white fluid (pus) and other material (debris). HOME CARE   Only take medicines as told by your doctor.  If you were given antibiotic medicine, take it as directed. Finish the medicine even if you start to feel better.  If gauze is used, follow your doctor's directions for changing the gauze.  To avoid spreading the infection:  Keep your abscess covered with a bandage.  Wash your hands well.  Do not share personal care items, towels, or whirlpools with others.  Avoid skin contact with others.  Keep your skin and clothes clean around the abscess.  Keep all doctor visits as told. GET HELP RIGHT AWAY IF:   You have more pain, puffiness (swelling), or redness in the wound site.  You have more fluid or blood coming from the wound site.  You have muscle aches, chills, or you feel sick.  You have a fever. MAKE SURE YOU:   Understand these instructions.  Will watch your condition.  Will get help right away if you are not doing well or get worse. Document Released: 07/17/2007 Document Revised: 07/30/2011 Document Reviewed: 04/12/2011 [facility] Patient Information 2015 [place], [place]. This information is not intended to replace advice given to you by your health care provider. Make sure you discuss any questions you have with your health care provider.

## 2014-10-05 NOTE — Progress Notes (Signed)
Subjective:  STABLE, for dc today  Objective Vital signs in last 24 hours: Filed Vitals:   10/04/14 2347 10/05/14 0302 10/05/14 0419 10/05/14 0500  BP:   139/64   Pulse:   91   Temp:   98.3 F (36.8 C)   TempSrc:   Oral   Resp: 16 16 16    Height:      Weight:    72.8 kg (160 lb 7.9 oz)  SpO2:   98%    Weight change: -6.747 kg (-14 lb 14 oz)  Physical Exam: General: alert  NAD resting in bed.  Heart: RRR, no mur, rub or gallop Lungs: CTA, unlabored  Abdomen: soft, nontender +BS , nondistended Extremities: L no pedal edema/Trace R pedal edema with R Knee thigh dressing dry /clear Dialysis Access: R IJ perm cath. L AVF +bruit   OP Dialysis Orders: [facility] . EDW 62.5 and leaving below HD Bath 2k,2ca Time 4hrs Heparin 7600. Access R ij p.cath/ LUA AVF  mircera 50 mcg q2wks hd   Assessment/Plan: 1. ESRD - HDTTS. 2. Right Knee/ thigh pyomyositis - on Ancef now w HD 3. Hypertension/volume- amlodipine 10 mg hs 4. Anemia - hgb 9.1.> 8.5 on esa 100mg  q thur  ^ 150 and fe weekly hd 5. Metabolic bone disease - Renvela binderon hold with Phos 1.9 reck labs today pre hd ( controlled as op phos  4.1) and no vit d/ with Constipation issues need to change binder if needed . 6. Nutrition - NPO for OR. renal vit .alb 2.6 7. IDDM Type 1 - per admt 8. DIspo - for [facility] MD pager [phone]    cell [phone] 10/05/2014, 11:32 AM    Labs: Basic Metabolic Panel:  Recent Labs Lab 09/29/14 1057  10/01/14 0409 10/01/14 2238 10/03/14 1307 10/04/14 1345  NA 138  < > 136 136 137 139  K 3.5  < > 4.5 3.4* 3.2* 3.5  CL 99*  < > 100* 98*  --  105  CO2 26  < > 20* 21*  --  24  GLUCOSE 88  < > 119* 301* 158* 111*  BUN 23*  < > 23* 9  --  31*  CREATININE 5.29*  < > 4.68* 2.24*  --  6.11*  CALCIUM 9.2  < > 9.0 8.2*  --  8.8*  PHOS 4.2  --   --  1.9*  --  4.9*  < > = values in this interval not displayed. Liver Function Tests:  Recent  Labs Lab 09/29/14 1057 10/01/14 2238 10/04/14 1345  ALBUMIN 2.6* 2.5* 2.2*   No results for input(s): LIPASE, AMYLASE in the last 168 hours. No results for input(s): AMMONIA in the last 168 hours. CBC:  Recent Labs Lab 09/29/14 1057 10/01/14 1619 10/01/14 2238 10/03/14 1307 10/04/14 1345  WBC 10.3 6.8 6.5  --  7.4  NEUTROABS 8.5* 4.7  --   --   --   HGB 9.1* 7.9* 9.1* 8.5* 7.5*  HCT 27.9* 24.5* 27.6* 25.0* 23.0*  MCV 88.6 88.1 88.5  --  86.8  PLT 250 245 209  --  261   Cardiac Enzymes: No results for input(s): CKTOTAL, CKMB, CKMBINDEX, TROPONINI in the last 168 hours. CBG:  Recent Labs Lab 10/04/14 2146 10/05/14 0059 10/05/14 0421 10/05/14 0503 10/05/14 0743  GLUCAP 100* 101* 42* 80 96    Studies/Results: No results found. Medications: . sodium chloride     . amLODipine  10  mg Oral QHS  .  ceFAZolin (ANCEF) IV  2 g Intravenous Q T,Th,Sat-1800  . darbepoetin (ARANESP) injection - DIALYSIS  100 mcg Intravenous Q Thu-HD  . enoxaparin (LOVENOX) injection  30 mg Subcutaneous Q24H  . feeding supplement  1 Container Oral BID BM  . ferric gluconate (FERRLECIT/NULECIT) IV  125 mg Intravenous Q Thu-HD  . insulin aspart  0-9 Units Subcutaneous Q4H  . insulin glargine  10 Units Subcutaneous QHS  . levothyroxine  75 mcg Oral QAC breakfast  . multivitamin  1 tablet Oral QHS  . OxyCODONE  20 mg Oral Q12H  . polyethylene glycol  17 g Oral Daily  . senna-docusate  2 tablet Oral BID  . sodium chloride  500 mL Intravenous Once  . sodium chloride  3 mL Intravenous Q12H

## 2014-10-08 LAB — TISSUE CULTURE: Culture: NO GROWTH

## 2014-10-20 ENCOUNTER — Encounter: Payer: Self-pay | Admitting: Vascular Surgery

## 2014-10-20 ENCOUNTER — Ambulatory Visit: Payer: BLUE CROSS/BLUE SHIELD | Admitting: Internal Medicine

## 2014-10-21 ENCOUNTER — Encounter: Payer: Self-pay | Admitting: Vascular Surgery

## 2014-10-21 ENCOUNTER — Ambulatory Visit (INDEPENDENT_AMBULATORY_CARE_PROVIDER_SITE_OTHER): Payer: Self-pay | Admitting: Vascular Surgery

## 2014-10-21 VITALS — BP 142/74 | HR 83 | Temp 98.0°F | Resp 16 | Ht 63.0 in | Wt 134.0 lb

## 2014-10-21 DIAGNOSIS — Z992 Dependence on renal dialysis: Secondary | ICD-10-CM

## 2014-10-21 DIAGNOSIS — N186 End stage renal disease: Secondary | ICD-10-CM

## 2014-10-21 NOTE — Progress Notes (Signed)
    Postoperative Access Visit   History of Present Illness  Tracy [name] is a 29 y.o. year old female who presents for postoperative follow-up for: L BC AVF (Date: 09/01/14).  The patient's wounds are healed.  The patient notes no steal symptoms.  The patient is able to complete their activities of daily living.  The patient's current symptoms are: none.  For VQI Use Only  PRE-ADM LIVING: Home  AMB STATUS: Ambulatory  Physical Examination Filed Vitals:   10/21/14 0931  BP: 142/74  Pulse: 83  Temp:   Resp:     LUE: Incision is healed, skin feels warm, hand grip is 5/5, sensation in digits is intact, palpable thrill, bruit can be auscultated , on Sonosite: fistula >7 mm  Medical Decision Making  Tracy [name] is a 29 y.o. year old female who presents s/p L BC AVF, RIJV TDC.  The patient's access is ready for use.  The patient's tunneled dialysis catheter can be removed after two successful cannulations and completed dialysis treatments.  Thank you for allowing [place] to participate in this patient's care.  Adele Barthel, MD [facility] Office: [phone] Pager: [phone]  10/21/2014, 10:14 AM

## 2014-10-24 ENCOUNTER — Telehealth: Payer: Self-pay | Admitting: *Deleted

## 2014-10-24 NOTE — Telephone Encounter (Signed)
Called the patient dialysis center [facility] [phone] and faxed an order to extend until 11/10/14 as per Dr Megan Salon.  Fax [phone]

## 2014-10-24 NOTE — Telephone Encounter (Signed)
Patient called and advises she received her last dose of IV antibiotics today and wants to know if she needs anymore. She advised she was told 6-8 weeks when they started and has only had 4 weeks. She is worried about a relapse. She is not taking any oral medication at this time. Advised her will ask the doctor and give her a call back.

## 2014-10-24 NOTE — Telephone Encounter (Signed)
Yes, continue antibiotics until her visit.

## 2014-10-25 ENCOUNTER — Ambulatory Visit: Payer: BLUE CROSS/BLUE SHIELD | Admitting: Internal Medicine

## 2014-11-09 ENCOUNTER — Ambulatory Visit (INDEPENDENT_AMBULATORY_CARE_PROVIDER_SITE_OTHER): Payer: BLUE CROSS/BLUE SHIELD | Admitting: Internal Medicine

## 2014-11-09 ENCOUNTER — Encounter: Payer: Self-pay | Admitting: Internal Medicine

## 2014-11-09 DIAGNOSIS — A4901 Methicillin susceptible Staphylococcus aureus infection, unspecified site: Secondary | ICD-10-CM | POA: Diagnosis not present

## 2014-11-09 LAB — C-REACTIVE PROTEIN

## 2014-11-09 NOTE — Assessment & Plan Note (Signed)
There is a very good chance that her infection has been cured but it has been very stubborn and she is very concerned about stopping antibiotics. I will repeat her sedimentation rate and C-reactive protein and call her tomorrow to discuss whether to stop antibiotics now, convert to oral cephalexin or continue IV cefazolin.

## 2014-11-09 NOTE — Progress Notes (Addendum)
Patient ID: Tracy [name], female   DOB: September 15, 1985, 29 y.o.   MRN: [id number]         [facility]ious [facility]  Patient Active Problem List   Diagnosis Date Noted  . MSSA (methicillin susceptible Staphylococcus aureus) infection 09/22/2014    Priority: High  . Myositis 08/09/2014    Priority: High  . Chronic anemia 09/28/2014  . Lower extremity pain, right 09/28/2014  . Abscess of right thigh   . Acute respiratory failure with hypoxia 08/21/2014  . Essential hypertension 08/10/2014  . Sepsis 08/09/2014  . DM I (diabetes mellitus, type I), uncontrolled 08/09/2014  . Irregular periods 07/05/2014  . Vaginal discharge 07/05/2014  . Yeast infection 07/05/2014  . Acute respiratory failure   . Seizure 01/20/2014  . Vomiting 01/20/2014  . Lactic acid acidosis 01/20/2014  . DKA (diabetic ketoacidoses) 01/18/2013  . Leukocytosis, unspecified 01/18/2013  . Acute encephalopathy 01/18/2013  . Hyperkalemia 01/18/2013  . Normocytic anemia 01/18/2013  . ESRD (end stage renal disease) on dialysis 01/18/2013  . 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 Hypothyroidism 10/15/2012  . Proliferative diabetic retinopathy 09/27/2011    Patient's Medications  New Prescriptions   No medications on file  Previous Medications   AMLODIPINE (NORVASC) 10 MG TABLET    Take 5 mg by mouth daily.    B-D ULTRAFINE III SHORT PEN 31G X 8 MM MISC       BARRIER CREAM (NON-SPECIFIED) CREA    Apply 1 application topically 2 (two) times daily as needed.   BD INSULIN SYRINGE ULTRAFINE 31G X 5/16" 0.3 ML MISC       BISACODYL (DULCOLAX) 10 MG SUPPOSITORY    Place 1 suppository (10 mg total) rectally daily as needed for moderate constipation.   CALCITRIOL (ROCALTROL) 0.5 MCG CAPSULE    Take 1 capsule (0.5 mcg total) by mouth daily.   CEFAZOLIN (ANCEF) 2-3 GM-% SOLR    Inject 50 mLs (2 g total) into the vein every Tuesday,  Thursday, and Saturday at 6 PM.   CEPHALEXIN (KEFLEX) 500 MG CAPSULE       DIAZEPAM (VALIUM) 5 MG TABLET    Take 1 tablet (5 mg total) by mouth every 6 (six) hours as needed for anxiety or muscle spasms.   FLUCONAZOLE (DIFLUCAN) 100 MG TABLET       HYDROCODONE-ACETAMINOPHEN (NORCO/VICODIN) 5-325 MG PER TABLET       IBUPROFEN (ADVIL,MOTRIN) 800 MG TABLET       INSULIN ASPART (NOVOLOG FLEXPEN) 100 UNIT/ML FLEXPEN    Inject 7 Units into the skin 3 (three) times daily with meals.   INSULIN GLARGINE (LANTUS SOLOSTAR) 100 UNIT/ML SOLOSTAR PEN    Inject 28 Units into the skin at bedtime.   LANTUS 100 UNIT/ML INJECTION       LEVOTHYROXINE (SYNTHROID, LEVOTHROID) 75 MCG TABLET    Take 75 mcg by mouth daily.   LEVOTHYROXINE (SYNTHROID, LEVOTHROID) 88 MCG TABLET    Take 1 tablet (88 mcg total) by mouth daily before breakfast.   MEGESTROL (MEGACE) 40 MG/ML SUSPENSION       MULTIVITAMIN (RENA-VIT) TABS TABLET    Take 1 tablet by mouth at bedtime.   ONE TOUCH ULTRA TEST TEST STRIP       OXYCODONE (OXYCONTIN) 20 MG T12A 12 HR TABLET    Take 1 tablet (20 mg total) by mouth every 12 (twelve) hours.   OXYCODONE-ACETAMINOPHEN (ROXICET) 5-325 MG PER TABLET  Take 2 tablets by mouth every 4 (four) hours as needed.   POLYETHYLENE GLYCOL (MIRALAX / GLYCOLAX) PACKET    Take 17 g by mouth daily.   PROMETHAZINE (PHENERGAN) 12.5 MG TABLET       SENNA-DOCUSATE (SENOKOT-S) 8.6-50 MG PER TABLET    Take 2 tablets by mouth 2 (two) times daily.   SEVELAMER CARBONATE (RENVELA) 800 MG TABLET    Take 800 mg by mouth 3 (three) times daily with meals.  Modified Medications   No medications on file  Discontinued Medications   No medications on file    Subjective: Tracy [name] is in for her routine hospital follow-up visit. She's been struggling with a right thigh abscess since June. She was hospitalized in June and had soft tissue infection of her right arm and right thigh. She received a long course of IV cefazolin after  hemodialysis but relapsed quickly in mid August. She underwent multiple surgeries. The distal right thigh abscess did not appear to involve her knee. Operative cultures in August were negative but I elected to treat her again with cefazolin and she is now completed 6-1/2 weeks of therapy since her surgery on August 17. She is doing better. She's had no problems tolerating her cefazolin. She has not had any fever, chills or sweats. She still has some pain that radiates from her knee up to her right buttock but it has improved. The swelling in her thigh has improved. They have increased her dry weight at dialysis. She notes swelling in both feet but greater in her right than her left especially after standing for long periods at work.  Review of Systems: Pertinent items are noted in HPI.  Past Medical History  Diagnosis Date  . Thyroid enlargement     "not on medication at this time"  . Urinary tract infection     hx of  . Anemia     presently on iron supplement  . Diabetes mellitus     insulin pump   . Hypothyroidism   . Anxiety   . HSV-2 (herpes simplex virus 2) infection   . HSV-1 (herpes simplex virus 1) infection   . Detached retina   . Yeast infection     took diflucan saturday   . Hypertension   . Irregular periods 07/05/2014  . Vaginal discharge 07/05/2014  . Abscess of right thigh     Social History  Substance Use Topics  . Smoking status: Never Smoker   . Smokeless tobacco: Never Used  . Alcohol Use: No    Family History  Problem Relation Age of Onset  . Cancer Paternal Grandfather     prostate  . Hyperlipidemia Paternal Grandfather   . Stroke Paternal Grandfather     Allergies  Allergen Reactions  . Penicillins Hives and Swelling  . Sulfa Antibiotics Hives    Objective: Filed Vitals:   11/09/14 0843 11/09/14 0849  BP: 200/97 196/95  Pulse: 87 82  Temp: 98.4 F (36.9 C)   TempSrc: Oral   Height: 5\' 3"  (1.6 m)   Weight: 134 lb 4 oz (60.895 kg)    Body  mass index is 23.79 kg/(m^2).  General: She is in no distress Skin: Right anterior chest dialysis catheter site appears normal Lungs: Clear Cor: Regular S1 and S2 with no murmurs Right leg: She has healing scars over her right side medially and more distally. Swelling is much improved. There is no cellulitis. She has no swelling in her knee.  Lab Results SED RATE (  mm/hr)  Date Value  08/09/2014 112*   CRP (mg/dL)  Date Value  08/09/2014 28.5*     Problem List Items Addressed This Visit      High   MSSA (methicillin susceptible Staphylococcus aureus) infection    There is a very good chance that her infection has been cured but it has been very stubborn and she is very concerned about stopping antibiotics. I will repeat her sedimentation rate and C-reactive protein and call her tomorrow to discuss whether to stop antibiotics now, convert to oral cephalexin or continue IV cefazolin.      Relevant Orders   C-reactive protein   Sedimentation rate       Michel Bickers, MD [facility] for Infectious Disease [place] [phone] pager   [phone] cell 11/09/2014, 9:08 AM   Addendum:  SED RATE (mm/hr)  Date Value  11/09/2014 12  08/09/2014 112*   CRP (mg/dL)  Date Value  11/09/2014 <0.5  08/09/2014 28.5*   Her inflammatory markers have returned to normal. I called her this morning and have recommended that she stop antibiotics at this time. She is in agreement with that plan.  Michel Bickers, MD [facility] for [place] [phone] pager   [phone] cell 11/10/2014, 12:26 PM

## 2014-11-10 LAB — SEDIMENTATION RATE: SED RATE: 12 mm/h (ref 0–20)

## 2014-11-10 NOTE — Addendum Note (Signed)
Addended by: Michel Bickers on: 11/10/2014 12:26 PM   Modules accepted: Orders, Medications

## 2014-11-17 ENCOUNTER — Telehealth: Payer: Self-pay | Admitting: Internal Medicine

## 2014-11-17 NOTE — Telephone Encounter (Signed)
Thank you for calling the patient back!

## 2014-11-17 NOTE — Telephone Encounter (Signed)
Ms. [name] has now been off of her antibiotic for her recurrent MSSA soft tissue infection of her right leg for 8 days. She rode her course for the first time this past Sunday and Monday. On Monday afternoon, 11/14/2014, she began to notice tenderness and some swelling in her right posterior calf. She states that it is gotten progressively more tender over the past few days. She has not done anything for it. She's not had any fever, chills or sweats. She saw her orthopedic surgeon, Dr. Fredonia Highland, yesterday but failed to mention this to him. I will continue observation off of antibiotics for now. I asked her to apply ice to her posterior calf for 10-15 minutes 3-4 times each day. I will send orders to her hemodialysis center [phone]) to have them obtain a repeat CBC, sedimentation rate and C-reactive protein at the time of her next dialysis on 11/19/2014. I will call her with results when they're faxed to me.

## 2014-11-17 NOTE — Telephone Encounter (Signed)
Patient states that she is having calf pain that is constant about 5 on the pain scale. She states that antibiotics were stopped on last office visit. Please advise

## 2014-11-21 NOTE — Telephone Encounter (Signed)
Patient called to see if her lab results have been faxed.  RN spoke with the dialysis center.  The labs were picked up this morning due to the inclement weather this weekend.  Lab results should be ready by Wednesday or Thursday this week.  RN relayed that information to patient.  She will continue as directed by Dr. Megan Salon, will contact us if her symptoms change or increase. Landis Gandy, RN

## 2014-11-25 ENCOUNTER — Telehealth: Payer: Self-pay | Admitting: Internal Medicine

## 2014-11-25 ENCOUNTER — Ambulatory Visit (HOSPITAL_COMMUNITY)
Admission: RE | Admit: 2014-11-25 | Discharge: 2014-11-25 | Disposition: A | Discharge status: Home / Self Care | Payer: BLUE CROSS/BLUE SHIELD | Source: Ambulatory Visit | Attending: Cardiology | Admitting: Cardiology

## 2014-11-25 ENCOUNTER — Other Ambulatory Visit (HOSPITAL_COMMUNITY): Payer: Self-pay | Admitting: Sports Medicine

## 2014-11-25 DIAGNOSIS — E119 Type 2 diabetes mellitus without complications: Secondary | ICD-10-CM | POA: Insufficient documentation

## 2014-11-25 DIAGNOSIS — M7989 Other specified soft tissue disorders: Secondary | ICD-10-CM | POA: Insufficient documentation

## 2014-11-25 DIAGNOSIS — M79604 Pain in right leg: Secondary | ICD-10-CM | POA: Diagnosis present

## 2014-11-25 DIAGNOSIS — I10 Essential (primary) hypertension: Secondary | ICD-10-CM | POA: Diagnosis not present

## 2014-11-25 DIAGNOSIS — Z9889 Other specified postprocedural states: Secondary | ICD-10-CM | POA: Diagnosis not present

## 2014-11-25 NOTE — Telephone Encounter (Signed)
SED RATE (mm/hr)  Date Value  11/09/2014 12  08/09/2014 112*   CRP (mg/dL)  Date Value  11/09/2014 <0.5  08/09/2014 28.5*   I spoke to Tracy [name] by phone this morning. She is still having some right calf pain. She notes that her calf will swell during the day and that the swelling will go down overnight. She is not having any redness or warmth of her calf. She has not had any fever, chills or sweats. I let her know that her recent inflammatory markers were normal. I suspect she is having some problems with pedal edema related to her recent right thigh infection and multiple surgeries rather than recurrent infection. However, I asked her to have one of the physicians or physician's assistance examine her calf when she is on hemodialysis tomorrow.

## 2014-11-28 ENCOUNTER — Other Ambulatory Visit: Payer: Self-pay | Admitting: Orthopedic Surgery

## 2014-11-28 ENCOUNTER — Ambulatory Visit
Admission: RE | Admit: 2014-11-28 | Discharge: 2014-11-28 | Disposition: A | Discharge status: Home / Self Care | Payer: BLUE CROSS/BLUE SHIELD | Source: Ambulatory Visit | Attending: Orthopedic Surgery | Admitting: Orthopedic Surgery

## 2014-11-28 DIAGNOSIS — L0291 Cutaneous abscess, unspecified: Secondary | ICD-10-CM

## 2014-11-30 ENCOUNTER — Telehealth: Payer: Self-pay | Admitting: *Deleted

## 2014-11-30 NOTE — Telephone Encounter (Signed)
Verbal order from Dr. Michel Bickers:  Ancef 2 grams after hemodialysis x 4 weeks.  Order written and faxed to pt's hemodialysis center in [place].  Pt notified of this order.  Pt is going to call Dr. Louie Casa office to discuss actual surgery date.  If possible the pt needs to have the surgery on a Friday due to too much time loss from her job.

## 2014-12-05 ENCOUNTER — Other Ambulatory Visit: Payer: Self-pay | Admitting: Adult Health

## 2014-12-05 NOTE — H&P (Signed)
PREOPERATIVE H&P  Chief Complaint: RIGHT CALF ABSCESS  HPI: Tracy [name] is a 29 y.o. female who presents for preoperative history and physical with a diagnosis of RIGHT CALF ABSCESS. Symptoms are rated as moderate to severe, and have been worsening.  This is significantly impairing activities of daily living.  She has elected for surgical management.   Past Medical History  Diagnosis Date  . Thyroid enlargement     "not on medication at this time"  . Urinary tract infection     hx of  . Anemia     presently on iron supplement  . Diabetes mellitus     insulin pump   . Hypothyroidism   . Anxiety   . HSV-2 (herpes simplex virus 2) infection   . HSV-1 (herpes simplex virus 1) infection   . Detached retina   . Yeast infection     took diflucan saturday   . Hypertension   . Irregular periods 07/05/2014  . Vaginal discharge 07/05/2014  . Abscess of right thigh    Past Surgical History  Procedure Laterality Date  . Cholecystectomy    . Pilonidal cyst excision    . Wisdom tooth extraction    . Pars plana vitrectomy  10/01/2011    Procedure: PARS PLANA VITRECTOMY WITH 25 GAUGE;  Surgeon: Hayden Pedro, MD;  Location: [address];  Service: Ophthalmology;  Laterality: Right;  Repair Complex Traction Retinal Detachment  . Trigger finger release Right 09/21/13  . I&d extremity Right 08/14/2014    Procedure: IRRIGATION AND DEBRIDEMENT EXTREMITY WITH MUSCLE BIOPSY;  Surgeon: Roseanne Kaufman, MD;  Location: [place];  Service: Orthopedics;  Laterality: Right;  . I&d extremity Right 08/20/2014    Procedure:  I AND D LEG / THIGH ABSCESS AND MANIPULATION OF RIGHT ELBOW.;  Surgeon: Renette Butters, MD;  Location: [facility];  Service: Orthopedics;  Laterality: Right;  . Av fistula placement Left 09/01/2014    Procedure: BRACHIOCEPHALIC ARTERIOVENOUS (AV) FISTULA CREATION;  Surgeon: Conrad Dunbar, MD;  Location: [place];  Service: Vascular;  Laterality: Left;  . Insertion of dialysis catheter N/A 09/01/2014    Procedure: INSERTION OF DIALYSIS CATHETER IN RIGHT INTERNAL JUGUILAR;  Surgeon: Conrad Newtown, MD;  Location: [place];  Service: Vascular;  Laterality: N/A;  . I&d extremity Right 09/30/2014    Procedure: IRRIGATION AND DEBRIDEMENT RIGHT THIGH ABSCESS;  Surgeon: [name], MD;  Location: [place];  Service: Orthopedics;  Laterality: Right;  . I&d extremity Right 10/03/2014    Procedure: IRRIGATION AND DEBRIDEMENT RIGHT THIGH ABSCESS,WITH WOUND CLOSURE & MANIPULATION OF RIGHT KNEE;  Surgeon: Renette Butters, MD;  Location: [place];  Service: Orthopedics;  Laterality: Right;  . Application of wound vac Right 10/03/2014    Procedure: APPLICATION OF WOUND VAC RIGHT THIGH;  Surgeon: Renette Butters, MD;  Location: [place];  Service: Orthopedics;  Laterality: Right;  . Application of a-cell of extremity Right 10/03/2014    Procedure: APPLICATION OF A-CELL OF RIGHT UPPER THIGH WOUND;  Surgeon: Renette Butters, MD;  Location: [place];  Service: Orthopedics;  Laterality: Right;   Social History   Social History  . Marital Status: Legally Separated    Spouse Name: Audelia Acton  . Number of Children: 0  . Years of Education: College   Occupational History  .  [employer]   Social History Main Topics  . Smoking status: Never Smoker   . Smokeless tobacco: Never Used  . Alcohol Use: No  . Drug Use:  No  . Sexual Activity: Not Currently    Birth Control/ Protection: None   Other Topics Concern  . Not on file   Social History Narrative   Patient lives at home with family.   Caffeine Use: 1 soda daily   Family History  Problem Relation Age of Onset  . Cancer Paternal Grandfather     prostate  . Hyperlipidemia Paternal Grandfather   . Stroke Paternal Grandfather    Allergies  Allergen Reactions  . Penicillins Hives and Swelling  . Sulfa Antibiotics Hives   Prior to Admission medications   Medication Sig Start Date End Date Taking? Authorizing Provider  amLODipine (NORVASC) 10 MG tablet Take 10 mg by mouth  daily.    Yes Historical Provider, MD  calcitRIOL (ROCALTROL) 0.5 MCG capsule Take 1 capsule (0.5 mcg total) by mouth daily. Patient taking differently: Take 0.5 mcg by mouth 3 (three) times a week. On Tuesday, Thursday and Saturday 09/02/14  Yes Theodis Blaze, MD  diazepam (VALIUM) 5 MG tablet Take 1 tablet (5 mg total) by mouth every 6 (six) hours as needed for anxiety or muscle spasms. 10/05/14  Yes Renette Butters, MD  Ferric Citrate (AURYXIA) 1 GM 210 MG(FE) TABS Take 1 tablet by mouth 3 (three) times daily.   Yes Historical Provider, MD  gabapentin (NEURONTIN) 100 MG capsule Take 100 mg by mouth at bedtime.   Yes Historical Provider, MD  HYDROcodone-acetaminophen (NORCO/VICODIN) 5-325 MG per tablet Take 1 tablet by mouth 3 (three) times daily as needed for moderate pain.  09/02/14  Yes Historical Provider, MD  insulin aspart (NOVOLOG FLEXPEN) 100 UNIT/ML FlexPen Inject 7 Units into the skin 3 (three) times daily with meals. Patient taking differently: Inject 40 Units into the skin daily. No more than 40 units daily via insulin pump 09/02/14  Yes Theodis Blaze, MD  levothyroxine (SYNTHROID, LEVOTHROID) 75 MCG tablet Take 75 mcg by mouth daily. 09/02/14  Yes Historical Provider, MD  multivitamin (RENA-VIT) TABS tablet Take 1 tablet by mouth at bedtime. 09/02/14  Yes Theodis Blaze, MD  ONE TOUCH ULTRA TEST test strip 1 each by Other route See admin instructions. Check blood sugar 4 times daily 10/07/14  Yes Historical Provider, MD  sertraline (ZOLOFT) 50 MG tablet Take 50 mg by mouth at bedtime.   Yes Historical Provider, MD  VITAMIN E SKIN OIL Apply 1 application topically as needed (for wound).   Yes Historical Provider, MD  barrier cream (NON-SPECIFIED) CREA Apply 1 application topically 2 (two) times daily as needed. Patient not taking: Reported on 12/01/2014 09/02/14   Theodis Blaze, MD  bisacodyl (DULCOLAX) 10 MG suppository Place 1 suppository (10 mg total) rectally daily as needed for moderate  constipation. Patient not taking: Reported on 12/01/2014 10/05/14   Velta Addison Mikhail, DO  Insulin Glargine (LANTUS SOLOSTAR) 100 UNIT/ML Solostar Pen Inject 28 Units into the skin at bedtime. Patient not taking: Reported on 12/01/2014 09/02/14   Theodis Blaze, MD  levothyroxine (SYNTHROID, LEVOTHROID) 88 MCG tablet Take 1 tablet (88 mcg total) by mouth daily before breakfast. Patient not taking: Reported on 09/27/2014 01/25/14   Verlee Monte, MD  oxyCODONE-acetaminophen (ROXICET) 5-325 MG per tablet Take 2 tablets by mouth every 4 (four) hours as needed. Patient not taking: Reported on 12/01/2014 10/05/14   Renette Butters, MD  polyethylene glycol [facility] / Floria Raveling) packet Take 17 g by mouth daily. Patient not taking: Reported on 12/01/2014 10/05/14   Cristal Ford, DO  senna-docusate (  SENOKOT-S) 8.6-50 MG per tablet Take 2 tablets by mouth 2 (two) times daily. 10/05/14   Maryann Mikhail, DO     Positive ROS: All other systems have been reviewed and were otherwise negative with the exception of those mentioned in the HPI and as above.  Physical Exam: General: Alert, no acute distress Cardiovascular: No pedal edema Respiratory: No cyanosis, no use of accessory musculature GI: No organomegaly, abdomen is soft and non-tender Skin: No lesions in the area of chief complaint Neurologic: Sensation intact distally Psychiatric: Patient is competent for consent with normal mood and affect Lymphatic: No axillary or cervical lymphadenopathy  MUSCULOSKELETAL:  L calf has no erythema or warmth, palpable area of firmness over the lateral calf measuring aprox 2cm in diameter, tender to palpation, pain with calf squeeze, sensation intact with 2+ distal pulses  Assessment: RIGHT CALF ABSCESS  Plan: Plan for Procedure(s): INCISION AND DRAINAGE ABSCESS  The risks benefits and alternatives were discussed with the patient including but not limited to the risks of nonoperative treatment, versus surgical  intervention including infection, bleeding, nerve injury,  blood clots, cardiopulmonary complications, morbidity, mortality, among others, and they were willing to proceed.   Gae Dry, PA-C  12/05/2014 10:07 AM

## 2014-12-08 ENCOUNTER — Encounter (HOSPITAL_COMMUNITY): Payer: Self-pay | Admitting: *Deleted

## 2014-12-08 MED ORDER — POTASSIUM CHLORIDE IN NACL 20-0.45 MEQ/L-% IV SOLN
INTRAVENOUS | Status: DC
  Filled 2014-12-08: qty 1000

## 2014-12-08 MED ORDER — CHLORHEXIDINE GLUCONATE 4 % EX LIQD: 60.0000 mL | Freq: Once | CUTANEOUS | Status: DC

## 2014-12-08 MED ORDER — ACETAMINOPHEN 500 MG PO TABS
1000.0000 mg | ORAL_TABLET | Freq: Once | ORAL | Status: AC
  Administered 2014-12-09: 1000 mg via ORAL
  Filled 2014-12-08: qty 2

## 2014-12-08 NOTE — Progress Notes (Signed)
Pt denies cardiac history, chest pain or sob. Pt is a Type 1 diabetic, now on an Insulin pump. I asked her if she was instructed by anyone on what to do about her insulin pump tonight. She stated that she had not been instructed by anyone. She sees an endocrinologist in [place]. I instructed pt to call the office and ask them for instructions for her insulin. She states she would do that. States her fasting blood sugars run between 100 - 150. She had an A1C drawn last week (12/02/14) and it was 7.4. Pt is also on dialysis Tuesdays, Thursdays and Saturdays.   Diabetes coordinator, Suanne Marker notified of pt's surgery for tomorrow.

## 2014-12-09 ENCOUNTER — Encounter (HOSPITAL_COMMUNITY): Payer: Self-pay | Admitting: *Deleted

## 2014-12-09 ENCOUNTER — Encounter (HOSPITAL_COMMUNITY)
Admission: RE | Disposition: A | Discharge status: Home / Self Care | Payer: Self-pay | Source: Ambulatory Visit | Attending: Orthopedic Surgery

## 2014-12-09 ENCOUNTER — Ambulatory Visit (HOSPITAL_COMMUNITY): Payer: BLUE CROSS/BLUE SHIELD | Admitting: Certified Registered Nurse Anesthetist

## 2014-12-09 ENCOUNTER — Ambulatory Visit (HOSPITAL_COMMUNITY)
Admission: RE | Admit: 2014-12-09 | Discharge: 2014-12-09 | Disposition: A | Discharge status: Home / Self Care | Payer: BLUE CROSS/BLUE SHIELD | Source: Ambulatory Visit | Attending: Orthopedic Surgery | Admitting: Orthopedic Surgery

## 2014-12-09 DIAGNOSIS — Z9641 Presence of insulin pump (external) (internal): Secondary | ICD-10-CM | POA: Insufficient documentation

## 2014-12-09 DIAGNOSIS — N189 Chronic kidney disease, unspecified: Secondary | ICD-10-CM | POA: Insufficient documentation

## 2014-12-09 DIAGNOSIS — Z880 Allergy status to penicillin: Secondary | ICD-10-CM | POA: Diagnosis not present

## 2014-12-09 DIAGNOSIS — L02415 Cutaneous abscess of right lower limb: Secondary | ICD-10-CM | POA: Diagnosis present

## 2014-12-09 DIAGNOSIS — M79604 Pain in right leg: Secondary | ICD-10-CM

## 2014-12-09 DIAGNOSIS — E1140 Type 2 diabetes mellitus with diabetic neuropathy, unspecified: Secondary | ICD-10-CM | POA: Insufficient documentation

## 2014-12-09 DIAGNOSIS — I129 Hypertensive chronic kidney disease with stage 1 through stage 4 chronic kidney disease, or unspecified chronic kidney disease: Secondary | ICD-10-CM | POA: Insufficient documentation

## 2014-12-09 DIAGNOSIS — E1122 Type 2 diabetes mellitus with diabetic chronic kidney disease: Secondary | ICD-10-CM | POA: Diagnosis not present

## 2014-12-09 DIAGNOSIS — E039 Hypothyroidism, unspecified: Secondary | ICD-10-CM | POA: Insufficient documentation

## 2014-12-09 HISTORY — DX: Major depressive disorder, single episode, unspecified: F32.9

## 2014-12-09 HISTORY — DX: Chronic kidney disease, unspecified: N18.9

## 2014-12-09 HISTORY — DX: Depression, unspecified: F32.A

## 2014-12-09 HISTORY — DX: Polyneuropathy, unspecified: G62.9

## 2014-12-09 HISTORY — DX: Unspecified convulsions: R56.9

## 2014-12-09 HISTORY — PX: INCISION AND DRAINAGE ABSCESS: SHX5864

## 2014-12-09 LAB — POCT I-STAT 4, (NA,K, GLUC, HGB,HCT)
GLUCOSE: 230 mg/dL — AB (ref 65–99)
HEMATOCRIT: 32 % — AB (ref 36.0–46.0)
Hemoglobin: 10.9 g/dL — ABNORMAL LOW (ref 12.0–15.0)
POTASSIUM: 3.6 mmol/L (ref 3.5–5.1)
Sodium: 138 mmol/L (ref 135–145)

## 2014-12-09 LAB — HCG, SERUM, QUALITATIVE: PREG SERUM: NEGATIVE

## 2014-12-09 LAB — GLUCOSE, CAPILLARY
GLUCOSE-CAPILLARY: 216 mg/dL — AB (ref 65–99)
GLUCOSE-CAPILLARY: 138 mg/dL — AB (ref 65–99)
GLUCOSE-CAPILLARY: 76 mg/dL (ref 65–99)

## 2014-12-09 SURGERY — INCISION AND DRAINAGE, ABSCESS
Anesthesia: General | Site: Leg Lower | Laterality: Right

## 2014-12-09 MED ORDER — ROCURONIUM BROMIDE 50 MG/5ML IV SOLN
INTRAVENOUS | Status: AC
  Filled 2014-12-09: qty 1

## 2014-12-09 MED ORDER — ONDANSETRON HCL 4 MG PO TABS: 4.0000 mg | ORAL_TABLET | Freq: Three times a day (TID) | ORAL | Status: DC | PRN

## 2014-12-09 MED ORDER — DIAZEPAM 5 MG PO TABS: 5.0000 mg | ORAL_TABLET | Freq: Four times a day (QID) | ORAL | Status: DC | PRN

## 2014-12-09 MED ORDER — FENTANYL CITRATE (PF) 100 MCG/2ML IJ SOLN
100.0000 ug | Freq: Once | INTRAMUSCULAR | Status: AC
  Administered 2014-12-09: 50 ug via INTRAVENOUS
  Filled 2014-12-09: qty 2

## 2014-12-09 MED ORDER — FENTANYL CITRATE (PF) 100 MCG/2ML IJ SOLN
INTRAMUSCULAR | Status: DC | PRN
  Administered 2014-12-09: 50 ug via INTRAVENOUS
  Administered 2014-12-09 (×2): 25 ug via INTRAVENOUS
  Administered 2014-12-09 (×3): 50 ug via INTRAVENOUS

## 2014-12-09 MED ORDER — ONDANSETRON HCL 4 MG PO TABS
4.0000 mg | ORAL_TABLET | Freq: Once | ORAL | Status: AC
  Administered 2014-12-09: 4 mg via ORAL

## 2014-12-09 MED ORDER — HYDROMORPHONE HCL 1 MG/ML IJ SOLN
0.5000 mg | INTRAMUSCULAR | Status: AC | PRN
  Administered 2014-12-09 (×4): 0.5 mg via INTRAVENOUS

## 2014-12-09 MED ORDER — PROPOFOL 10 MG/ML IV BOLUS
INTRAVENOUS | Status: DC | PRN
  Administered 2014-12-09 (×2): 50 mg via INTRAVENOUS
  Administered 2014-12-09: 20 mg via INTRAVENOUS
  Administered 2014-12-09: 50 mg via INTRAVENOUS

## 2014-12-09 MED ORDER — OXYCODONE-ACETAMINOPHEN 5-325 MG PO TABS
2.0000 | ORAL_TABLET | Freq: Once | ORAL | Status: AC
  Administered 2014-12-09: 2 via ORAL

## 2014-12-09 MED ORDER — SODIUM CHLORIDE 0.9 % IR SOLN
Status: DC | PRN
  Administered 2014-12-09: 1000 mL

## 2014-12-09 MED ORDER — HYDROMORPHONE HCL 1 MG/ML IJ SOLN
INTRAMUSCULAR | Status: AC
  Administered 2014-12-09: 0.5 mg via INTRAVENOUS
  Filled 2014-12-09: qty 1

## 2014-12-09 MED ORDER — FENTANYL CITRATE (PF) 100 MCG/2ML IJ SOLN
INTRAMUSCULAR | Status: AC
  Administered 2014-12-09: 50 ug
  Filled 2014-12-09: qty 2

## 2014-12-09 MED ORDER — ONDANSETRON HCL 4 MG/2ML IJ SOLN
INTRAMUSCULAR | Status: AC
  Filled 2014-12-09: qty 2

## 2014-12-09 MED ORDER — OXYCODONE-ACETAMINOPHEN 5-325 MG PO TABS: 1.0000 | ORAL_TABLET | ORAL | Status: DC | PRN

## 2014-12-09 MED ORDER — MIDAZOLAM HCL 2 MG/2ML IJ SOLN
INTRAMUSCULAR | Status: AC
  Filled 2014-12-09: qty 4

## 2014-12-09 MED ORDER — HYDROMORPHONE HCL 1 MG/ML IJ SOLN
INTRAMUSCULAR | Status: AC
  Filled 2014-12-09: qty 1

## 2014-12-09 MED ORDER — SODIUM CHLORIDE 0.9 % IV SOLN
INTRAVENOUS | Status: DC
  Administered 2014-12-09 (×2): via INTRAVENOUS

## 2014-12-09 MED ORDER — OXYCODONE-ACETAMINOPHEN 5-325 MG PO TABS
ORAL_TABLET | ORAL | Status: AC
  Filled 2014-12-09: qty 2

## 2014-12-09 MED ORDER — FENTANYL CITRATE (PF) 250 MCG/5ML IJ SOLN
INTRAMUSCULAR | Status: AC
  Filled 2014-12-09: qty 5

## 2014-12-09 MED ORDER — LIDOCAINE HCL (CARDIAC) 20 MG/ML IV SOLN
INTRAVENOUS | Status: DC | PRN
  Administered 2014-12-09: 60 mg via INTRAVENOUS

## 2014-12-09 MED ORDER — MIDAZOLAM HCL 5 MG/5ML IJ SOLN
INTRAMUSCULAR | Status: DC | PRN
  Administered 2014-12-09: 2 mg via INTRAVENOUS

## 2014-12-09 MED ORDER — PROPOFOL 10 MG/ML IV BOLUS
INTRAVENOUS | Status: AC
  Filled 2014-12-09: qty 20

## 2014-12-09 MED ORDER — CEFAZOLIN SODIUM-DEXTROSE 2-3 GM-% IV SOLR
INTRAVENOUS | Status: AC
  Administered 2014-12-09: 2 g via INTRAVENOUS
  Filled 2014-12-09: qty 50

## 2014-12-09 MED ORDER — ONDANSETRON HCL 4 MG/2ML IJ SOLN
INTRAMUSCULAR | Status: DC | PRN
  Administered 2014-12-09: 4 mg via INTRAVENOUS

## 2014-12-09 SURGICAL SUPPLY — 51 items
BANDAGE ELASTIC 4 VELCRO ST LF (GAUZE/BANDAGES/DRESSINGS) ×3 IMPLANT
BANDAGE ELASTIC 6 VELCRO ST LF (GAUZE/BANDAGES/DRESSINGS) ×3 IMPLANT
BLADE SURG 10 STRL SS (BLADE) ×3 IMPLANT
BNDG COHESIVE 4X5 TAN STRL (GAUZE/BANDAGES/DRESSINGS) ×1 IMPLANT
BNDG COHESIVE 6X5 TAN STRL LF (GAUZE/BANDAGES/DRESSINGS) ×2 IMPLANT
BNDG GAUZE ELAST 4 BULKY (GAUZE/BANDAGES/DRESSINGS) ×3 IMPLANT
COVER SURGICAL LIGHT HANDLE (MISCELLANEOUS) ×3 IMPLANT
CUFF TOURNIQUET SINGLE 34IN LL (TOURNIQUET CUFF) IMPLANT
DRSG EMULSION OIL 3X3 NADH (GAUZE/BANDAGES/DRESSINGS) ×2 IMPLANT
DRSG PAD ABDOMINAL 8X10 ST (GAUZE/BANDAGES/DRESSINGS) ×2 IMPLANT
DURAPREP 26ML APPLICATOR (WOUND CARE) ×3 IMPLANT
ELECT REM PT RETURN 9FT ADLT (ELECTROSURGICAL)
ELECTRODE REM PT RTRN 9FT ADLT (ELECTROSURGICAL) IMPLANT
EVACUATOR 1/8 PVC DRAIN (DRAIN) IMPLANT
FACESHIELD WRAPAROUND (MASK) ×9 IMPLANT
FACESHIELD WRAPAROUND OR TEAM (MASK) ×3 IMPLANT
GAUZE SPONGE 4X4 12PLY STRL (GAUZE/BANDAGES/DRESSINGS) ×3 IMPLANT
GAUZE XEROFORM 1X8 LF (GAUZE/BANDAGES/DRESSINGS) ×3 IMPLANT
GLOVE BIO SURGEON STRL SZ7 (GLOVE) ×9 IMPLANT
GLOVE BIO SURGEON STRL SZ7.5 (GLOVE) ×3 IMPLANT
GLOVE BIOGEL PI IND STRL 7.0 (GLOVE) ×1 IMPLANT
GLOVE BIOGEL PI INDICATOR 7.0 (GLOVE) ×2
GOWN STRL REUS W/ TWL LRG LVL3 (GOWN DISPOSABLE) ×2 IMPLANT
GOWN STRL REUS W/TWL LRG LVL3 (GOWN DISPOSABLE) ×6
HANDPIECE INTERPULSE COAX TIP (DISPOSABLE)
KIT BASIN OR (CUSTOM PROCEDURE TRAY) ×3 IMPLANT
KIT ROOM TURNOVER OR (KITS) ×3 IMPLANT
MANIFOLD NEPTUNE II (INSTRUMENTS) ×1 IMPLANT
NS IRRIG 1000ML POUR BTL (IV SOLUTION) ×3 IMPLANT
PACK ORTHO EXTREMITY (CUSTOM PROCEDURE TRAY) ×3 IMPLANT
PAD ABD 8X10 STRL (GAUZE/BANDAGES/DRESSINGS) ×2 IMPLANT
PAD ARMBOARD 7.5X6 YLW CONV (MISCELLANEOUS) ×6 IMPLANT
PENCIL BUTTON HOLSTER BLD 10FT (ELECTRODE) IMPLANT
SET CYSTO W/LG BORE CLAMP LF (SET/KITS/TRAYS/PACK) ×2 IMPLANT
SET HNDPC FAN SPRY TIP SCT (DISPOSABLE) IMPLANT
SPONGE GAUZE 4X4 12PLY STER LF (GAUZE/BANDAGES/DRESSINGS) ×2 IMPLANT
SPONGE LAP 18X18 X RAY DECT (DISPOSABLE) ×3 IMPLANT
STOCKINETTE IMPERVIOUS 9X36 MD (GAUZE/BANDAGES/DRESSINGS) ×3 IMPLANT
SUT ETHILON 3 0 FSL (SUTURE) ×2 IMPLANT
SUT ETHILON 3 0 PS 1 (SUTURE) ×2 IMPLANT
TOWEL OR 17X24 6PK STRL BLUE (TOWEL DISPOSABLE) ×3 IMPLANT
TOWEL OR 17X26 10 PK STRL BLUE (TOWEL DISPOSABLE) ×3 IMPLANT
TOWEL OR NON WOVEN STRL DISP B (DISPOSABLE) ×3 IMPLANT
TUBE ANAEROBIC SPECIMEN COL (MISCELLANEOUS) IMPLANT
TUBE CONNECTING 12'X1/4 (SUCTIONS) ×1
TUBE CONNECTING 12X1/4 (SUCTIONS) ×2 IMPLANT
TUBE CONNECTING 20'X1/4 (TUBING) ×1
TUBE CONNECTING 20X1/4 (TUBING) ×1 IMPLANT
UNDERPAD 30X30 INCONTINENT (UNDERPADS AND DIAPERS) ×3 IMPLANT
WATER STERILE IRR 1000ML POUR (IV SOLUTION) ×3 IMPLANT
YANKAUER SUCT BULB TIP NO VENT (SUCTIONS) ×3 IMPLANT

## 2014-12-09 NOTE — Op Note (Signed)
12/09/2014  1:34 PM  PATIENT:  Tracy [name]    PRE-OPERATIVE DIAGNOSIS:  RIGHT CALF ABSCESS  POST-OPERATIVE DIAGNOSIS:  Same  PROCEDURE:  INCISION AND DRAINAGE ABSCESS  SURGEON:  Novah Nessel, Ernesta Amble, MD  ASSISTANT: Lovett Calender, PA-C, She was present and scrubbed throughout the case, critical for completion in a timely fashion, and for retraction, instrumentation, and closure.   ANESTHESIA:   gen  PREOPERATIVE INDICATIONS:  Tracy [name] is a  29 y.o. female with a diagnosis of RIGHT CALF ABSCESS who failed conservative measures and elected for surgical management.    The risks benefits and alternatives were discussed with the patient preoperatively including but not limited to the risks of infection, bleeding, nerve injury, cardiopulmonary complications, the need for revision surgery, among others, and the patient was willing to proceed.  OPERATIVE IMPLANTS: nonne  OPERATIVE FINDINGS: edematous fascia with no necrosis or purulent collection  BLOOD LOSS: 30  COMPLICATIONS: none  TOURNIQUET TIME: none  OPERATIVE PROCEDURE:  Patient was identified in the preoperative holding area and site was marked by me She was transported to the operating theater and placed on the table in supine position taking care to pad all bony prominences. After a preincinduction time out anesthesia was induced. The right lower extremity was prepped and draped in normal sterile fashion and a pre-incision timeout was performed. She received ancef for preoperative antibiotics.   She had a palpable firmness in her occipital lateral calf muscle belly. This correlated with MRI. I made a longitudinal 5 cm incision. There was no large fluid collection some edema noted around the fascia but no necrosis of the fascia and no muscle necrosis.  I then incised the muscle fascia there was a potential space in this region but again no large fluid collection at the tissue and sent it for culture.  I developed  all potential space subdermal as well as some fascial and submuscular to confirm no fluid collections and developed.  Again investigated all muscle and fascia there is no necrosis. I then thoroughly irrigated her incision with 3 L of saline. I placed a sterile tape be drain.  Close her skin with a nylon incision placed a sterile dressing and she is taking the PACU in stable condition.  POST OPERATIVE PLAN: Obs, Dyalisis in the AM and pull drain on dispo. WBAT in boot. Ambulate for dvt px.     This note was generated using a template and dragon dictation system. In light of that, I have reviewed the note and all aspects of it are applicable to this case. Any dictation errors are due to the computerized dictation system.

## 2014-12-09 NOTE — Interval H&P Note (Signed)
History and Physical Interval Note:  12/09/2014 12:46 PM  Tracy [name]  has presented today for surgery, with the diagnosis of RIGHT CALF ABSCESS  The various methods of treatment have been discussed with the patient and family. After consideration of risks, benefits and other options for treatment, the patient has consented to  Procedure(s): INCISION AND DRAINAGE ABSCESS (Right) as a surgical intervention .  The patient's history has been reviewed, patient examined, no change in status, stable for surgery.  I have reviewed the patient's chart and labs.  Questions were answered to the patient's satisfaction.     Iver Miklas D

## 2014-12-09 NOTE — Anesthesia Preprocedure Evaluation (Addendum)
Anesthesia Evaluation  Patient identified by MRN, date of birth, ID band Patient awake    Reviewed: Allergy & Precautions, NPO status , Patient's Chart, lab work & pertinent test results  History of Anesthesia Complications Negative for: history of anesthetic complications  Airway Mallampati: II  TM Distance: >3 FB Neck ROM: Full    Dental no notable dental hx.    Pulmonary neg pulmonary ROS,    Pulmonary exam normal breath sounds clear to auscultation       Cardiovascular hypertension, Pt. on medications Normal cardiovascular exam Rhythm:Regular Rate:Normal     Neuro/Psych Seizures -,  PSYCHIATRIC DISORDERS Anxiety Depression    GI/Hepatic negative GI ROS, Neg liver ROS,   Endo/Other  diabetes, Insulin DependentHypothyroidism   Renal/GU Renal disease     Musculoskeletal negative musculoskeletal ROS (+)   Abdominal   Peds  Hematology  (+) anemia ,   Anesthesia Other Findings   Reproductive/Obstetrics negative OB ROS                            Anesthesia Physical Anesthesia Plan  ASA: III  Anesthesia Plan: General   Post-op Pain Management:    Induction: Intravenous  Airway Management Planned: LMA  Additional Equipment:   Intra-op Plan:   Post-operative Plan: Extubation in OR  Informed Consent: I have reviewed the patients History and Physical, chart, labs and discussed the procedure including the risks, benefits and alternatives for the proposed anesthesia with the patient or authorized representative who has indicated his/her understanding and acceptance.   Dental advisory given  Plan Discussed with: CRNA  Anesthesia Plan Comments:         Anesthesia Quick Evaluation

## 2014-12-09 NOTE — Progress Notes (Signed)
Called [place] PA regarding prescription for Valium for discharge. Pt previously had Valium and is currently out. [place] will bring script by pacu prior to patient being discharged home today.

## 2014-12-09 NOTE — Anesthesia Procedure Notes (Signed)
Procedure Name: LMA Insertion Date/Time: 12/09/2014 12:55 PM Performed by: Layla Maw Pre-anesthesia Checklist: Patient identified, Timeout performed, Emergency Drugs available, Suction available and Patient being monitored Patient Re-evaluated:Patient Re-evaluated prior to inductionOxygen Delivery Method: Circle system utilized Preoxygenation: Pre-oxygenation with 100% oxygen Intubation Type: IV induction Ventilation: Mask ventilation without difficulty LMA: LMA inserted LMA Size: 3.0 Number of attempts: 1 Placement Confirmation: positive ETCO2 and breath sounds checked- equal and bilateral Tube secured with: Tape Dental Injury: Teeth and Oropharynx as per pre-operative assessment

## 2014-12-09 NOTE — Progress Notes (Addendum)
Spoke to Dr. Alain Marion concerning PCN allergy and ancef ordered. Esmond Plants stated pt. Has received it several times in the last couple of months and did fine with it.  Pt. Has insulin pump set at basal rate .10,   cbg 230, Dr. Lissa Hoard notified. Dr. Lissa Hoard instructed pt. To give self bolus insulin 1.6

## 2014-12-09 NOTE — Discharge Instructions (Signed)
Keep dressing clean and dry till follow up  Continue abx with hemodialysis per ID  Will see in the clinic on Monday to pull drain.    Weight bearing as tolerated in the leg.

## 2014-12-09 NOTE — Transfer of Care (Signed)
Immediate Anesthesia Transfer of Care Note  Patient: Tracy [name]  Procedure(s) Performed: Procedure(s): INCISION AND DRAINAGE ABSCESS (Right)  Patient Location: PACU  Anesthesia Type:General  Level of Consciousness: awake, alert , oriented and patient cooperative  Airway & Oxygen Therapy: Patient Spontanous Breathing and Patient connected to nasal cannula oxygen  Post-op Assessment: Report given to RN, Post -op Vital signs reviewed and stable and Patient moving all extremities X 4  Post vital signs: Reviewed and stable  Last Vitals:  Filed Vitals:   12/09/14 0938  BP: 161/87  Pulse: 82  Temp: 36.4 C  Resp: 20    Complications: No apparent anesthesia complications

## 2014-12-12 ENCOUNTER — Encounter (HOSPITAL_COMMUNITY): Payer: Self-pay | Admitting: Orthopedic Surgery

## 2014-12-12 DIAGNOSIS — L02415 Cutaneous abscess of right lower limb: Secondary | ICD-10-CM | POA: Diagnosis present

## 2014-12-12 NOTE — Anesthesia Postprocedure Evaluation (Signed)
Anesthesia Post Note  Patient: Tracy [name]  Procedure(s) Performed: Procedure(s) (LRB): INCISION AND DRAINAGE ABSCESS (Right)  Anesthesia type: General  Patient location: PACU  Post pain: Pain level controlled  Post assessment: Post-op Vital signs reviewed  Last Vitals: BP 132/75 mmHg  Pulse 77  Temp(Src) 36.5 C (Oral)  Resp 15  Ht 5\' 3"  (1.6 m)  Wt 127 lb (57.607 kg)  BMI 22.50 kg/m2  SpO2 100%  LMP 09/13/2014  Post vital signs: Reviewed  Level of consciousness: sedated  Complications: No apparent anesthesia complications

## 2014-12-12 NOTE — Discharge Summary (Signed)
Physician Discharge Summary  Patient ID: GENESE HIBBETT MRN: [id number] DOB/AGE: [birth year] 29 y.o.  Admit date: 12/09/2014 Discharge date: 12/12/2014  Admission Diagnoses:  Abscess of right lower leg  Discharge Diagnoses:  Principal Problem:   Abscess of right lower leg   Past Medical History  Diagnosis Date  . Thyroid enlargement     "not on medication at this time"  . Urinary tract infection     hx of  . Anemia     presently on iron supplement  . Hypothyroidism   . Anxiety   . HSV-2 (herpes simplex virus 2) infection   . HSV-1 (herpes simplex virus 1) infection   . Detached retina   . Yeast infection     took diflucan saturday   . Hypertension   . Irregular periods 07/05/2014  . Vaginal discharge 07/05/2014  . Abscess of right thigh   . Diabetes mellitus     insulin pump  Type 1  . Depression   . Chronic kidney disease     on dialysis T, Th, Sat  . Seizures ([place]) 01/2014    ? due to blood sugar  . Neuropathy ([place])     in feet    Surgeries: Procedure(s): INCISION AND DRAINAGE ABSCESS on 12/09/2014   Consultants (if any):    Discharged Condition: Improved  Hospital Course: KILLIAN DEMEDEIROS is an 29 y.o. female who was admitted 12/09/2014 with a diagnosis of Abscess of right lower leg and went to the operating room on 12/09/2014 and underwent the above named procedures.    She was given perioperative antibiotics:      Anti-infectives    Start     Dose/Rate Route Frequency Ordered Stop   12/09/14 1019  ceFAZolin (ANCEF) 2-3 GM-% IVPB SOLR    Comments:  Nyoka Cowden   : cabinet override      12/09/14 1019 12/09/14 1255    .  She was given sequential compression devices and early ambulation for DVT prophylaxis.  She benefited maximally from the hospital stay and there were no complications.    Recent vital signs:  Filed Vitals:   12/09/14 1545  BP: 132/75  Pulse: 77  Temp:   Resp: 15    Recent laboratory studies:  Lab Results   Component Value Date   HGB 10.9* 12/09/2014   HGB 8.1* 10/05/2014   HGB 7.5* 10/04/2014   Lab Results  Component Value Date   WBC 7.1 10/05/2014   PLT 256 10/05/2014   Lab Results  Component Value Date   INR 1.40 08/16/2014   Lab Results  Component Value Date   NA 138 12/09/2014   K 3.6 12/09/2014   CL 99* 10/05/2014   CO2 25 10/05/2014   BUN 12 10/05/2014   CREATININE 3.82* 10/05/2014   GLUCOSE 230* 12/09/2014    Discharge Medications:     Medication List    TAKE these medications        amLODipine 10 MG tablet  Commonly known as:  NORVASC  Take 10 mg by mouth at bedtime.     AURYXIA 1 GM 210 MG(FE) Tabs  Generic drug:  Ferric Citrate  Take 1 tablet by mouth 3 (three) times daily.     barrier cream Crea  Commonly known as:  non-specified  Apply 1 application topically 2 (two) times daily as needed.     bisacodyl 10 MG suppository  Commonly known as:  DULCOLAX  Place 1 suppository (10 mg total) rectally daily as needed  for moderate constipation.     calcitRIOL 0.5 MCG capsule  Commonly known as:  ROCALTROL  Take 1 capsule (0.5 mcg total) by mouth daily.     diazepam 5 MG tablet  Commonly known as:  VALIUM  Take 1 tablet (5 mg total) by mouth every 6 (six) hours as needed for anxiety.     fluconazole 100 MG tablet  Commonly known as:  DIFLUCAN  TAKE 1 TABLET BY MOUTH NOW, AND 1 TABLET IN 3 DAYS     gabapentin 100 MG capsule  Commonly known as:  NEURONTIN  Take 100 mg by mouth at bedtime.     HYDROcodone-acetaminophen 5-325 MG tablet  Commonly known as:  NORCO/VICODIN  Take 1 tablet by mouth 3 (three) times daily as needed for moderate pain.     insulin aspart 100 UNIT/ML FlexPen  Commonly known as:  NOVOLOG FLEXPEN  Inject 7 Units into the skin 3 (three) times daily with meals.     Insulin Glargine 100 UNIT/ML Solostar Pen  Commonly known as:  LANTUS SOLOSTAR  Inject 28 Units into the skin at bedtime.     levothyroxine 75 MCG tablet   Commonly known as:  SYNTHROID, LEVOTHROID  Take 75 mcg by mouth daily.     lidocaine-prilocaine cream  Commonly known as:  EMLA  Apply 1 application topically as needed (uses with dialysis).     multivitamin Tabs tablet  Take 1 tablet by mouth at bedtime.     ondansetron 4 MG tablet  Commonly known as:  ZOFRAN  Take 1 tablet (4 mg total) by mouth every 8 (eight) hours as needed for nausea or vomiting.     ONE TOUCH ULTRA TEST test strip  Generic drug:  glucose blood  1 each by Other route See admin instructions. Check blood sugar 4 times daily     oxyCODONE-acetaminophen 5-325 MG tablet  Commonly known as:  PERCOCET  Take 1-2 tablets by mouth every 4 (four) hours as needed for severe pain.     polyethylene glycol packet  Commonly known as:  MIRALAX / GLYCOLAX  Take 17 g by mouth daily.     senna-docusate 8.6-50 MG tablet  Commonly known as:  Senokot-S  Take 2 tablets by mouth 2 (two) times daily.     sertraline 50 MG tablet  Commonly known as:  ZOLOFT  Take 50 mg by mouth at bedtime.     VITAMIN E SKIN Oil  Apply 1 application topically as needed (for wound).        Diagnostic Studies: Mr Tibia Fibula Right Wo Contrast  11/28/2014  CLINICAL DATA:  Right lower leg pain and swelling with focal fluid collection on ultrasound 2 months ago. History of myositis and staph infection in the right upper arm and upper leg 3 months ago following cat scratch. EXAM: MRI OF LOWER RIGHT EXTREMITY WITHOUT CONTRAST TECHNIQUE: Multiplanar, multisequence MR imaging of the right lower leg was performed. No intravenous contrast was administered. COMPARISON:  Ultrasound 09/28/2014. MRI of the right thigh 08/12/2014. FINDINGS: Capsules were placed around the patient's palpable concern in the right calf. There is mild subcutaneous edema and superficial fascial fluid posteriorly in the proximal right lower leg. Compared with the previous MR examination of the thigh, overall appearance appears  improved, although the overlap is marginal. The residual fluid appears smaller than that seen on ultrasound. There is some T2 hyperintensity within the lateral head of the gastrocnemius and the soleus muscles. No focal deep fluid collections are  observed. No contrast was administered. No significant osseous findings are demonstrated within the visualized right tibia or fibula. There is no evidence of osteomyelitis. No significant vascular findings are seen. Incidental imaging of the proximal left lower leg on the axial images demonstrates minimal subcutaneous and muscular edema without focal fluid collection. IMPRESSION: 1. Moderate muscular edema within the right gastrocnemius and soleus musculature could be the sequela of significant multifocal myositis demonstrated in the right thigh on prior MRI, although recurrent myositis/infection cannot excluded. 2. Nonspecific subcutaneous edema posteriorly in the proximal right calf, possibly cellulitis. 3. No osseous or vascular abnormalities identified. Electronically Signed   By: Richardean Sale M.D.   On: 11/28/2014 15:36    Disposition: 01-Home or Self Care  Discharge Instructions    Weight bearing as tolerated    Complete by:  As directed   Laterality:  right  Extremity:  Lower           Follow-up Information    Follow up with MURPHY, TIMOTHY D, MD In 3 days.   Specialty:  Orthopedic Surgery   Contact information:   [address] [place] [id number] [phone]        Signed: Gae Dry 12/12/2014, 6:31 AM Cell [phone]

## 2014-12-13 LAB — TISSUE CULTURE: GRAM STAIN: NONE SEEN

## 2014-12-14 ENCOUNTER — Ambulatory Visit: Payer: BLUE CROSS/BLUE SHIELD | Admitting: Internal Medicine

## 2014-12-14 ENCOUNTER — Telehealth: Payer: Self-pay | Admitting: Internal Medicine

## 2014-12-14 NOTE — Telephone Encounter (Signed)
Patient called back and I relayed Dr. Hale Bogus message to her. Myrtis Hopping

## 2014-12-14 NOTE — Telephone Encounter (Signed)
After speaking with Dr. 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 She did not improve and underwent incision and drainage on 12/09/2014. No frank abscess was found but she had edematous muscle fascia that was debrided. Operative cultures have grown Escherichia coli and Panteo agglomerans. Both organisms are resistant to cefazolin. I faxed orders to her dialysis center today to switch her to cefepime 2 g IV after hemodialysis 4 weeks. [name] will follow up with me on 12/19/2014. I did leave a message on her phone this morning letting her know about the switch.

## 2014-12-15 ENCOUNTER — Telehealth: Payer: Self-pay | Admitting: *Deleted

## 2014-12-15 NOTE — Telephone Encounter (Addendum)
Call from [place] at patient's dialysis center stating they are unable to get the cefepime. Verbal order per Dr. Megan Salon to change to ceftazidime 2 grams after dialysis x 4 weeks. Myrtis Hopping

## 2014-12-19 ENCOUNTER — Encounter: Payer: Self-pay | Admitting: Internal Medicine

## 2014-12-19 ENCOUNTER — Ambulatory Visit (INDEPENDENT_AMBULATORY_CARE_PROVIDER_SITE_OTHER): Payer: BLUE CROSS/BLUE SHIELD | Admitting: Internal Medicine

## 2014-12-19 VITALS — BP 172/92 | HR 73 | Temp 98.1°F | Ht 63.0 in | Wt 129.5 lb

## 2014-12-19 DIAGNOSIS — A498 Other bacterial infections of unspecified site: Secondary | ICD-10-CM | POA: Diagnosis not present

## 2014-12-19 DIAGNOSIS — L02419 Cutaneous abscess of limb, unspecified: Secondary | ICD-10-CM

## 2014-12-21 DIAGNOSIS — L02419 Cutaneous abscess of limb, unspecified: Secondary | ICD-10-CM | POA: Insufficient documentation

## 2014-12-21 NOTE — Progress Notes (Signed)
[facility] for Infectious Disease  Patient Active Problem List   Diagnosis Date Noted  . Abscess of calf 12/21/2014    Priority: High  . Escherichia coli (E. coli) infection 12/19/2014    Priority: High  . Abscess of right lower leg 12/12/2014    Priority: High  . Abscess of right thigh     Priority: High  . MSSA (methicillin susceptible Staphylococcus aureus) infection 09/22/2014    Priority: High  . ESRD (end stage renal disease) on dialysis ([place]) 01/18/2013    Priority: Medium  . 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 Chronic anemia 09/28/2014  . Essential hypertension 08/10/2014  . Irregular periods 07/05/2014  . Seizure ([place]) 01/20/2014  . Hypothyroidism 10/15/2012  . Proliferative diabetic retinopathy ([place]) 09/27/2011    Patient's Medications  New Prescriptions   No medications on file  Previous Medications   AMLODIPINE (NORVASC) 10 MG TABLET    Take 10 mg by mouth at bedtime.    BARRIER CREAM (NON-SPECIFIED) CREA    Apply 1 application topically 2 (two) times daily as needed.   BISACODYL (DULCOLAX) 10 MG SUPPOSITORY    Place 1 suppository (10 mg total) rectally daily as needed for moderate constipation.   CALCITRIOL (ROCALTROL) 0.5 MCG CAPSULE    Take 1 capsule (0.5 mcg total) by mouth daily.   CEFTAZIDIME (FORTAZ) 2 G SOLR INJECTION    Inject 2 g into the vein. 3 times each week after hemodialysis   DIAZEPAM (VALIUM) 5 MG TABLET    Take 1 tablet (5 mg total) by mouth every 6 (six) hours as needed for anxiety.   FERRIC CITRATE (AURYXIA) 1 GM 210 MG(FE) TABS    Take 1 tablet by mouth 3 (three) times daily.   FLUCONAZOLE (DIFLUCAN) 100 MG TABLET    TAKE 1 TABLET BY MOUTH NOW, AND 1 TABLET IN 3 DAYS   GABAPENTIN (NEURONTIN) 100 MG CAPSULE    Take 100 mg by mouth at bedtime.   HYDROCODONE-ACETAMINOPHEN (NORCO/VICODIN) 5-325 MG PER TABLET    Take 1 tablet by mouth  3 (three) times daily as needed for moderate pain.    INSULIN ASPART (NOVOLOG FLEXPEN) 100 UNIT/ML FLEXPEN    Inject 7 Units into the skin 3 (three) times daily with meals.   INSULIN GLARGINE (LANTUS SOLOSTAR) 100 UNIT/ML SOLOSTAR PEN    Inject 28 Units into the skin at bedtime.   LEVOTHYROXINE (SYNTHROID, LEVOTHROID) 75 MCG TABLET    Take 75 mcg by mouth daily.   LIDOCAINE-PRILOCAINE (EMLA) CREAM    Apply 1 application topically as needed (uses with dialysis).   MULTIVITAMIN (RENA-VIT) TABS TABLET    Take 1 tablet by mouth at bedtime.   ONDANSETRON (ZOFRAN) 4 MG TABLET    Take 1 tablet (4 mg total) by mouth every 8 (eight) hours as needed for nausea or vomiting.   ONE TOUCH ULTRA TEST TEST STRIP    1 each by Other route See admin instructions. Check blood sugar 4 times daily   OXYCODONE-ACETAMINOPHEN (PERCOCET) 5-325 MG TABLET    Take 1-2 tablets by mouth every 4 (four) hours as needed for severe pain.   POLYETHYLENE GLYCOL (MIRALAX / GLYCOLAX) PACKET    Take 17 g by mouth daily.   SENNA-DOCUSATE (SENOKOT-S) 8.6-50 MG PER TABLET    Take 2 tablets by mouth 2 (two) times daily.   SERTRALINE (ZOLOFT) 50  MG TABLET    Take 50 mg by mouth at bedtime.   VITAMIN E SKIN OIL    Apply 1 application topically as needed (for wound).  Modified Medications   No medications on file  Discontinued Medications   No medications on file    Subjective: Tracy [name] is in for her routine follow-up visit. She has type 1 diabetes complicated by end-stage renal disease requiring hemodialysis. She has struggled with soft tissue infections since this summer. She was admitted in July with right arm and right thigh pain. She was found to have soft tissue infections. She underwent incision and drainage of her right upper arm on 08/14/2014. Operative cultures grew MSSA. She underwent incision and drainage of her right thigh on 08/17/2014 and 08/20/2014. Operative cultures again grew MSSA. She received 4 weeks of antibiotic  therapy, initially with IV cefazolin and then confirming to oral cephalexin completing therapy on 09/09/2014. She received 3 weeks of antibiotic therapy following her last positive culture. She appeared to respond well to this therapy.  She developed recurrent right thigh pain and swelling within a few days. I restarted her on IV cefazolin on 09/22/2014. MRI revealed a complex fluid collection in the distal thigh. She underwent repeat incision and drainage on 09/28/2014 and 09/30/2014. Operative cultures were negative. She continued on IV cefazolin for 6-1/2 weeks, completing therapy on 11/10/2014. She continued to heal well and had no further evidence of right thigh inflammation.  However, shortly after stopping cefazolin she began to have some pain in her right posterior calf associated with some swelling of her right lower leg. I decided to restart her on IV cefazolin on 11/30/2014. She underwent incision and drainage on 12/09/2014. Some necrotic fascia was debrided. Operative cultures grew Escherichia coli and Panteo agglomerans. I switched her to IV ceftazidime on 12/14/2014. She is doing better today. She's had no problem tolerating her antibiotics. She is having less pain and swelling in her right calf. Her sutures remain in.  She has had no obvious problems with her left upper arm AV fistula. She's had no evidence of infection at other sites recently. She has had no trauma inciting her soft tissue infections.  Review of Systems: Review of Systems  Constitutional: Negative for fever, chills, malaise/fatigue and diaphoresis.       Her appetite has been good. Her dry weight has been decreased about 2 kg over the past few months.  HENT: Negative for sore throat.   Respiratory: Negative for cough, sputum production and shortness of breath.   Cardiovascular: Negative for chest pain and leg swelling.  Gastrointestinal: Negative for nausea, vomiting and diarrhea.  Musculoskeletal: Positive for  myalgias. Negative for joint pain.  Skin: Negative for rash.  Neurological: Negative for focal weakness.  Psychiatric/Behavioral: Negative for depression and substance abuse. The patient is not nervous/anxious.     Past Medical History  Diagnosis Date  . Thyroid enlargement     "not on medication at this time"  . Urinary tract infection     hx of  . Anemia     presently on iron supplement  . Hypothyroidism   . Anxiety   . HSV-2 (herpes simplex virus 2) infection   . HSV-1 (herpes simplex virus 1) infection   . Detached retina   . Yeast infection     took diflucan saturday   . Hypertension   . Irregular periods 07/05/2014  . Vaginal discharge 07/05/2014  . Abscess of right thigh   . Diabetes mellitus  insulin pump  Type 1  . Depression   . Chronic kidney disease     on dialysis T, Th, Sat  . Seizures ([place]) 01/2014    ? due to blood sugar  . Neuropathy ([place])     in feet    Social History  Substance Use Topics  . Smoking status: Never Smoker   . Smokeless tobacco: Never Used  . Alcohol Use: No    Family History  Problem Relation Age of Onset  . Cancer Paternal Grandfather     prostate  . Hyperlipidemia Paternal Grandfather   . Stroke Paternal Grandfather     Allergies  Allergen Reactions  . Penicillins Hives and Swelling  . Sulfa Antibiotics Hives    Objective: Filed Vitals:   12/19/14 1621  BP: 172/92  Pulse: 73  Temp: 98.1 F (36.7 C)  TempSrc: Oral  Height: 5\' 3"  (1.6 m)  Weight: 129 lb 8 oz (58.741 kg)   Body mass index is 22.95 kg/(m^2).  Physical Exam  Constitutional: She is oriented to person, place, and time.  She is well dressed, in no distress, and in good spirits.  HENT:  Mouth/Throat: No oropharyngeal exudate.  Eyes: Conjunctivae are normal.  Cardiovascular: Normal rate and regular rhythm.   No murmur heard. Pulmonary/Chest: Breath sounds normal.  Abdominal: Soft. She exhibits no mass. There is no tenderness.  Musculoskeletal:  Normal range of motion. She exhibits no edema or tenderness.  Her right posterior calf incision is clean and dry without any evidence of inflammation. 5 sutures remain in place.  Neurological: She is alert and oriented to person, place, and time.  Skin: No rash noted.  Psychiatric: Mood and affect normal.    Lab Results    Problem List Items Addressed This Visit      High   Abscess of calf    He remains unclear why Haly continues to have such difficulty with migratory, polymicrobial soft tissue infections. There has been no evidence of bloodstream infection or problems with her AV fistula that could lead to recurrent soft tissue infection. She seemed to be responding well after her most recent surgery. I plan to continue her ceftazadime for several more weeks. She is currently being followed by Dr. Raynald Blend at the [facility] diabetes clinic. She does not believe that he is aware of all the problems she has been having. I will send a copy of my note today to him. At this point I cannot think of any other specialist that is likely to be able to help sort this out. I will see her back in 2 weeks. I asked her to call me right away if she has any new problems or concerns before that visit.      Escherichia coli (E. coli) infection - Primary   Relevant Medications   Ceftazidime (FORTAZ) 2 G SOLR injection       Michel Bickers, MD [facility] for [facility] [phone] pager   [phone] cell 12/21/2014, 6:10 PM

## 2014-12-21 NOTE — Assessment & Plan Note (Signed)
He remains unclear why Tracy [name] continues to have such difficulty with migratory, polymicrobial soft tissue infections. There has been no evidence of bloodstream infection or problems with her AV fistula that could lead to recurrent soft tissue infection. She seemed to be responding well after her most recent surgery. I plan to continue her ceftazadime for several more weeks. She is currently being followed by Dr. Raynald Blend at the [facility] diabetes clinic. She does not believe that he is aware of all the problems she has been having. I will send a copy of my note today to him. At this point I cannot think of any other specialist that is likely to be able to help sort this out. I will see her back in 2 weeks. I asked her to call me right away if she has any new problems or concerns before that visit.

## 2015-01-03 ENCOUNTER — Other Ambulatory Visit: Payer: Self-pay | Admitting: Adult Health

## 2015-01-04 ENCOUNTER — Ambulatory Visit: Payer: BLUE CROSS/BLUE SHIELD | Admitting: Internal Medicine

## 2015-02-05 ENCOUNTER — Encounter (HOSPITAL_COMMUNITY): Payer: Self-pay | Admitting: *Deleted

## 2015-02-05 ENCOUNTER — Emergency Department (HOSPITAL_COMMUNITY)
Admission: EM | Admit: 2015-02-05 | Discharge: 2015-02-06 | Disposition: A | Discharge status: Home / Self Care | Payer: BLUE CROSS/BLUE SHIELD | Attending: Emergency Medicine | Admitting: Emergency Medicine

## 2015-02-05 DIAGNOSIS — F329 Major depressive disorder, single episode, unspecified: Secondary | ICD-10-CM | POA: Insufficient documentation

## 2015-02-05 DIAGNOSIS — Z79899 Other long term (current) drug therapy: Secondary | ICD-10-CM | POA: Insufficient documentation

## 2015-02-05 DIAGNOSIS — N189 Chronic kidney disease, unspecified: Secondary | ICD-10-CM | POA: Insufficient documentation

## 2015-02-05 DIAGNOSIS — Z872 Personal history of diseases of the skin and subcutaneous tissue: Secondary | ICD-10-CM | POA: Diagnosis not present

## 2015-02-05 DIAGNOSIS — I129 Hypertensive chronic kidney disease with stage 1 through stage 4 chronic kidney disease, or unspecified chronic kidney disease: Secondary | ICD-10-CM | POA: Diagnosis not present

## 2015-02-05 DIAGNOSIS — G629 Polyneuropathy, unspecified: Secondary | ICD-10-CM | POA: Diagnosis not present

## 2015-02-05 DIAGNOSIS — Z992 Dependence on renal dialysis: Secondary | ICD-10-CM | POA: Diagnosis not present

## 2015-02-05 DIAGNOSIS — Z8742 Personal history of other diseases of the female genital tract: Secondary | ICD-10-CM | POA: Diagnosis not present

## 2015-02-05 DIAGNOSIS — E039 Hypothyroidism, unspecified: Secondary | ICD-10-CM | POA: Diagnosis not present

## 2015-02-05 DIAGNOSIS — E119 Type 2 diabetes mellitus without complications: Secondary | ICD-10-CM | POA: Insufficient documentation

## 2015-02-05 DIAGNOSIS — D649 Anemia, unspecified: Secondary | ICD-10-CM | POA: Diagnosis not present

## 2015-02-05 DIAGNOSIS — F419 Anxiety disorder, unspecified: Secondary | ICD-10-CM | POA: Insufficient documentation

## 2015-02-05 DIAGNOSIS — Z8619 Personal history of other infectious and parasitic diseases: Secondary | ICD-10-CM | POA: Insufficient documentation

## 2015-02-05 DIAGNOSIS — E162 Hypoglycemia, unspecified: Secondary | ICD-10-CM | POA: Insufficient documentation

## 2015-02-05 DIAGNOSIS — Z880 Allergy status to penicillin: Secondary | ICD-10-CM | POA: Diagnosis not present

## 2015-02-05 DIAGNOSIS — Z87440 Personal history of urinary (tract) infections: Secondary | ICD-10-CM | POA: Diagnosis not present

## 2015-02-05 DIAGNOSIS — Z794 Long term (current) use of insulin: Secondary | ICD-10-CM | POA: Insufficient documentation

## 2015-02-05 DIAGNOSIS — R4182 Altered mental status, unspecified: Secondary | ICD-10-CM | POA: Diagnosis present

## 2015-02-05 NOTE — ED Notes (Signed)
CBG upon arrival, was 37.

## 2015-02-05 NOTE — ED Notes (Signed)
Pt eating without difficulty at present time.  Alert and oriented.

## 2015-02-05 NOTE — ED Provider Notes (Signed)
CSN: [id number]     Arrival date & time 02/05/15  2308 History  By signing my name below, I, Tracy [name], attest that this documentation has been prepared under the direction and in the presence of Tracy Porter, MD at 2345. Electronically Signed: Terressa [name], ED Scribe. 02/06/2015. 12:11 AM.  Chief Complaint  Patient presents with  . Altered Mental Status   The history is provided by the patient and a relative. No language interpreter was used.   PCP: Tracy [name] HPI Comments: Tracy [name] is a 29 y.o. female, with PMHx noted below including DMTI, CKD on dialysis (T, Th, Sat-- started on August 14, 2014, last dialysis on 12/24), who presents to the Emergency Department complaining of AMS onset 35-40 minutes ago. Patient had been fine all day and they had visited relatives and she reports eating and playing "family feud". Family members reports that pt woke up from a nap this evening and was disoriented and non vocal when they asked her questions, her mother states she was just staring.. Pt reports that she took insulin this evening, before her nap, without checking her glucose levels. Her mother was unable to get her glucose monitor to work so they brought her to the ED. Pt's glucose level was 37 upon ED arrival. Associated Sx include diaphoresis.Pt reports she is normally on the insulin pump but discontinued use two days ago and has been injecting her insulin 2x a day instead. She states her legs were sore from using the pump.   Pt denies nausea or vomiting.  Pt denies tobacco use or EtOH use. She also reports they had decreased her Norvasc this week because for the past 2 weeks she's been having a low blood pressure during dialysis. She states the last time she had dialysis before they changed her blood pressure medicine her blood pressure was 80/56. She reports she did not take her Norvasc today.  PCP none [facility]  Past Medical History  Diagnosis Date  . Thyroid  enlargement     "not on medication at this time"  . Urinary tract infection     hx of  . Anemia     presently on iron supplement  . Hypothyroidism   . Anxiety   . HSV-2 (herpes simplex virus 2) infection   . HSV-1 (herpes simplex virus 1) infection   . Detached retina   . Yeast infection     took diflucan saturday   . Hypertension   . Irregular periods 07/05/2014  . Vaginal discharge 07/05/2014  . Abscess of right thigh   . Diabetes mellitus     insulin pump  Type 1  . Depression   . Chronic kidney disease     on dialysis T, Th, Sat  . Seizures ([place]) 01/2014    ? due to blood sugar  . Neuropathy ([place])     in feet   Past Surgical History  Procedure Laterality Date  . Cholecystectomy    . Pilonidal cyst excision    . Wisdom tooth extraction    . Pars plana vitrectomy  10/01/2011    Procedure: PARS PLANA VITRECTOMY WITH 25 GAUGE;  Surgeon: Hayden Pedro, MD;  Location: [place];  Service: Ophthalmology;  Laterality: Right;  Repair Complex Traction Retinal Detachment  . Trigger finger release Right 09/21/13  . I&d extremity Right 08/14/2014    Procedure: IRRIGATION AND DEBRIDEMENT EXTREMITY WITH MUSCLE BIOPSY;  Surgeon: Roseanne Kaufman, MD;  Location: [place];  Service: Orthopedics;  Laterality: Right;  . I&d extremity Right 08/20/2014    Procedure:  I AND D LEG / THIGH ABSCESS AND MANIPULATION OF RIGHT ELBOW.;  Surgeon: Renette Butters, MD;  Location: [place];  Service: Orthopedics;  Laterality: Right;  . Av fistula placement Left 09/01/2014    Procedure: BRACHIOCEPHALIC ARTERIOVENOUS (AV) FISTULA CREATION;  Surgeon: Conrad Remington, MD;  Location: [place];  Service: Vascular;  Laterality: Left;  . Insertion of dialysis catheter N/A 09/01/2014    Procedure: INSERTION OF DIALYSIS CATHETER IN RIGHT INTERNAL JUGUILAR;  Surgeon: Conrad Baxter, MD;  Location: [place];  Service: Vascular;  Laterality: N/A;  . I&d extremity Right 09/30/2014    Procedure: IRRIGATION AND DEBRIDEMENT RIGHT THIGH ABSCESS;   Surgeon: [name], MD;  Location: [place];  Service: Orthopedics;  Laterality: Right;  . I&d extremity Right 10/03/2014    Procedure: IRRIGATION AND DEBRIDEMENT RIGHT THIGH ABSCESS,WITH WOUND CLOSURE & MANIPULATION OF RIGHT KNEE;  Surgeon: Renette Butters, MD;  Location: [place];  Service: Orthopedics;  Laterality: Right;  . Application of wound vac Right 10/03/2014    Procedure: APPLICATION OF WOUND VAC RIGHT THIGH;  Surgeon: Renette Butters, MD;  Location: [place];  Service: Orthopedics;  Laterality: Right;  . Application of a-cell of extremity Right 10/03/2014    Procedure: APPLICATION OF A-CELL OF RIGHT UPPER THIGH WOUND;  Surgeon: Renette Butters, MD;  Location: [place];  Service: Orthopedics;  Laterality: Right;  . Incision and drainage abscess Right 12/09/2014    Procedure: INCISION AND DRAINAGE ABSCESS;  Surgeon: Renette Butters, MD;  Location: [place];  Service: Orthopedics;  Laterality: Right;   Family History  Problem Relation Age of Onset  . Cancer Paternal Grandfather     prostate  . Hyperlipidemia Paternal Grandfather   . Stroke Paternal Grandfather    Social History  Substance Use Topics  . Smoking status: Never Smoker   . Smokeless tobacco: Never Used  . Alcohol Use: No    OB History    Gravida Para Term Preterm AB TAB SAB Ectopic Multiple Living   2    2  2         Review of Systems  Constitutional: Positive for diaphoresis. Negative for fever.  Gastrointestinal: Negative for nausea.  Psychiatric/Behavioral: Positive for confusion.  All other systems reviewed and are negative.  Allergies  Penicillins and Sulfa antibiotics  Home Medications   Prior to Admission medications   Medication Sig Start Date End Date Taking? Authorizing Provider  acyclovir (ZOVIRAX) 400 MG tablet TAKE 1 TABLET (400 MG TOTAL) BY MOUTH 2 (TWO) TIMES DAILY. 01/03/15   Tracy Dooms, NP  amLODipine (NORVASC) 10 MG tablet Take 10 mg by mouth at bedtime.     Historical Provider, MD   barrier cream (NON-SPECIFIED) CREA Apply 1 application topically 2 (two) times daily as needed. Patient not taking: Reported on 12/01/2014 09/02/14   Theodis Blaze, MD  bisacodyl (DULCOLAX) 10 MG suppository Place 1 suppository (10 mg total) rectally daily as needed for moderate constipation. Patient not taking: Reported on 12/01/2014 10/05/14   Velta Addison Mikhail, DO  calcitRIOL (ROCALTROL) 0.5 MCG capsule Take 1 capsule (0.5 mcg total) by mouth daily. Patient taking differently: Take 0.5 mcg by mouth 3 (three) times a week. On Tuesday, Thursday and Saturday 09/02/14   Theodis Blaze, MD  Ceftazidime (FORTAZ) 2 G SOLR injection Inject 2 g into the vein. 3 times each week after hemodialysis    Historical Provider,  MD  diazepam (VALIUM) 5 MG tablet Take 1 tablet (5 mg total) by mouth every 6 (six) hours as needed for anxiety. 12/09/14   Brittney Claiborne Billings, PA-C  Ferric Citrate (AURYXIA) 1 GM 210 MG(FE) TABS Take 1 tablet by mouth 3 (three) times daily.    Historical Provider, MD  fluconazole (DIFLUCAN) 100 MG tablet TAKE 1 TABLET BY MOUTH NOW, AND 1 TABLET IN 3 DAYS 12/06/14   Tracy Dooms, NP  gabapentin (NEURONTIN) 100 MG capsule Take 100 mg by mouth at bedtime.    Historical Provider, MD  HYDROcodone-acetaminophen (NORCO/VICODIN) 5-325 MG per tablet Take 1 tablet by mouth 3 (three) times daily as needed for moderate pain.  09/02/14   Historical Provider, MD  insulin aspart (NOVOLOG FLEXPEN) 100 UNIT/ML FlexPen Inject 7 Units into the skin 3 (three) times daily with meals. Patient taking differently: Inject 40 Units into the skin daily. No more than 40 units daily via insulin pump 09/02/14   Theodis Blaze, MD  Insulin Glargine (LANTUS SOLOSTAR) 100 UNIT/ML Solostar Pen Inject 28 Units into the skin at bedtime. Patient not taking: Reported on 12/01/2014 09/02/14   Theodis Blaze, MD  levothyroxine (SYNTHROID, LEVOTHROID) 75 MCG tablet Take 75 mcg by mouth daily. 09/02/14   Historical Provider, MD   lidocaine-prilocaine (EMLA) cream Apply 1 application topically as needed (uses with dialysis).    Historical Provider, MD  multivitamin (RENA-VIT) TABS tablet Take 1 tablet by mouth at bedtime. 09/02/14   Theodis Blaze, MD  ondansetron (ZOFRAN) 4 MG tablet Take 1 tablet (4 mg total) by mouth every 8 (eight) hours as needed for nausea or vomiting. 12/09/14   Brittney Claiborne Billings, PA-C  ONE TOUCH ULTRA TEST test strip 1 each by Other route See admin instructions. Check blood sugar 4 times daily 10/07/14   Historical Provider, MD  oxyCODONE-acetaminophen (PERCOCET) 5-325 MG tablet Take 1-2 tablets by mouth every 4 (four) hours as needed for severe pain. 12/09/14   Brittney Claiborne Billings, PA-C  polyethylene glycol (MIRALAX / GLYCOLAX) packet Take 17 g by mouth daily. Patient not taking: Reported on 12/01/2014 10/05/14   Maryann Mikhail, DO  senna-docusate (SENOKOT-S) 8.6-50 MG per tablet Take 2 tablets by mouth 2 (two) times daily. 10/05/14   Maryann Mikhail, DO  sertraline (ZOLOFT) 50 MG tablet Take 50 mg by mouth at bedtime.    Historical Provider, MD  VITAMIN E SKIN OIL Apply 1 application topically as needed (for wound).    Historical Provider, MD   Triage Vitals: BP 181/108 mmHg  Pulse 88  Resp 20  Ht 5\' 3"  (1.6 m)  Wt 125 lb (56.7 kg)  BMI 22.15 kg/m2  SpO2 99%  LMP 01/22/2015  Vital signs normal except hypertension which improved without treatment  Physical Exam  Constitutional: She is oriented to person, place, and time. She appears well-developed and well-nourished.  Non-toxic appearance. She does not appear ill. No distress.  HENT:  Head: Normocephalic and atraumatic.  Right Ear: External ear normal.  Left Ear: External ear normal.  Nose: Nose normal. No mucosal edema or rhinorrhea.  Mouth/Throat: Oropharynx is clear and moist and mucous membranes are normal. No dental abscesses or uvula swelling.  Eyes: Conjunctivae and EOM are normal. Pupils are equal, round, and reactive to light.  Neck:  Normal range of motion and full passive range of motion without pain. Neck supple.  Cardiovascular: Normal rate, regular rhythm and normal heart sounds.  Exam reveals no gallop and no friction rub.   No  murmur heard. Pulmonary/Chest: Effort normal and breath sounds normal. No respiratory distress. She has no wheezes. She has no rhonchi. She has no rales. She exhibits no tenderness and no crepitus.  Abdominal: Soft. Normal appearance and bowel sounds are normal. She exhibits no distension. There is no tenderness. There is no rebound and no guarding.  Musculoskeletal: Normal range of motion. She exhibits no edema or tenderness.  Moves all extremities well.   Neurological: She is alert and oriented to person, place, and time. She has normal strength. No cranial nerve deficit.  Skin: Skin is warm, dry and intact. No rash noted. No erythema. No pallor.  LUE: fistula with good bruit and thrill present.   Psychiatric: She has a normal mood and affect. Her speech is normal and behavior is normal. Her mood appears not anxious.  Nursing note and vitals reviewed.   ED Course  Procedures (including critical care time)  Medications  insulin aspart (novoLOG) injection 4 Units (not administered)    DIAGNOSTIC STUDIES: Oxygen Saturation is 99% on ra, nl by my interpretation.    COORDINATION OF CARE: 12:01 AM: Discussed treatment plan which includes CBG monitoring with pt at bedside; patient verbalizes understanding and agrees with treatment plan.  Patient had initial CBG of 37. She was able to eat and drink. She was monitored for couple hours and her blood sugar slowly improved until she became hyperglycemic. She was then given 4 units of regular insulin subcutaneous. She states she feels ready to go home and she is going to replace her insulin pump when she gets home to manage her diabetes.  Labs Review Results for orders placed or performed during the hospital encounter of 02/05/15  CBG monitoring,  ED  Result Value Ref Range   Glucose-Capillary 37 (LL) 65 - 99 mg/dL   Comment 1 Notify RN   CBG monitoring, ED  Result Value Ref Range   Glucose-Capillary 93 65 - 99 mg/dL  CBG monitoring, ED  Result Value Ref Range   Glucose-Capillary 198 (H) 65 - 99 mg/dL  CBG monitoring, ED  Result Value Ref Range   Glucose-Capillary 371 (H) 65 - 99 mg/dL   Laboratory interpretation all normal except initial hypoglycemia at 37, and she slowly improved until she became hyperglycemic.    I have personally reviewed and evaluated these lab results as part of my medical decision-making.    MDM   Final diagnoses:  Hypoglycemia   Plan discharge  Tracy Porter, MD, FACEP   I personally performed the services described in this documentation, which was scribed in my presence. The recorded information has been reviewed and considered.  Tracy Porter, MD, Barbette Or, MD 02/06/15 [phone]

## 2015-02-05 NOTE — ED Notes (Signed)
Pt is diabetic and on dialysis and later this evening after dinner pt had a change in mentality; family members states she woke up from sleep and was disoriented; pt is alert at this time; pt's glucose meter at home would not read her cbg

## 2015-02-06 LAB — CBG MONITORING, ED
Glucose-Capillary: 198 mg/dL — ABNORMAL HIGH (ref 65–99)
Glucose-Capillary: 37 mg/dL — CL (ref 65–99)
Glucose-Capillary: 371 mg/dL — ABNORMAL HIGH (ref 65–99)
Glucose-Capillary: 447 mg/dL — ABNORMAL HIGH (ref 65–99)
Glucose-Capillary: 93 mg/dL (ref 65–99)

## 2015-02-06 MED ORDER — INSULIN ASPART 100 UNIT/ML ~~LOC~~ SOLN
4.0000 [IU] | Freq: Once | SUBCUTANEOUS | Status: AC
  Administered 2015-02-06: 4 [IU] via SUBCUTANEOUS
  Filled 2015-02-06: qty 1

## 2015-02-06 NOTE — Discharge Instructions (Signed)
Monitor your blood sugar closely over the next 24 hours, recheck as needed.  Hypoglycemia Low blood sugar (hypoglycemia) means that the level of sugar in your blood is lower than it should be. Signs of low blood sugar include:  Getting sweaty.  Feeling hungry.  Feeling dizzy or weak.  Feeling sleepier than normal.  Feeling nervous.  Headaches.  Having a fast heartbeat. Low blood sugar can happen fast and can be an emergency. Your doctor can do tests to check your blood sugar level. You can have low blood sugar and not have diabetes. HOME CARE  Check your blood sugar as told by your doctor. If it is less than 70 mg/dl or as told by your doctor, take 1 of the following:  3 to 4 glucose tablets.   cup clear juice.   cup soda pop, not diet.  1 cup milk.  5 to 6 hard candies.  Recheck blood sugar after 15 minutes. Repeat until it is at the right level.  Eat a snack if it is more than 1 hour until the next meal.  Only take medicine as told by your doctor.  Do not skip meals. Eat on time.  Do not drink alcohol except with meals.  Check your blood glucose before driving.  Check your blood glucose before and after exercise.  Always carry treatment with you, such as glucose pills.  Always wear a medical alert bracelet if you have diabetes. GET HELP RIGHT AWAY IF:   Your blood glucose goes below 70 mg/dl or as told by your doctor, and you:  Are confused.  Are not able to swallow.  Pass out (faint).  You cannot treat yourself. You may need someone to help you.  You have low blood sugar problems often.  You have problems from your medicines.  You are not feeling better after 3 to 4 days.  You have vision changes. MAKE SURE YOU:   Understand these instructions.  Will watch this condition.  Will get help right away if you are not doing well or get worse.   This information is not intended to replace advice given to you by your health care provider. Make  sure you discuss any questions you have with your health care provider.   Document Released: 04/24/2009 Document Revised: 02/18/2014 Document Reviewed: 10/04/2014 Elsevier Interactive Patient Education [employer].

## 2015-02-06 NOTE — ED Notes (Signed)
Pt to go home and take normal medications.

## 2015-03-14 ENCOUNTER — Telehealth: Payer: Self-pay | Admitting: *Deleted

## 2015-03-14 NOTE — Telephone Encounter (Signed)
Call from [facility] surgery department requesting a medical clearance. Form filled out by Dr. Megan Salon and faxed back. Unable to clear at this time due to not keeping her follow up appointment. Form placed upfront for scan.

## 2015-04-05 ENCOUNTER — Other Ambulatory Visit: Payer: Self-pay | Admitting: Adult Health

## 2015-05-02 ENCOUNTER — Encounter: Payer: Self-pay | Admitting: Advanced Practice Midwife

## 2015-05-02 ENCOUNTER — Ambulatory Visit (INDEPENDENT_AMBULATORY_CARE_PROVIDER_SITE_OTHER): Payer: BLUE CROSS/BLUE SHIELD | Admitting: Advanced Practice Midwife

## 2015-05-02 VITALS — BP 128/70 | Ht 63.0 in | Wt 136.0 lb

## 2015-05-02 DIAGNOSIS — B379 Candidiasis, unspecified: Secondary | ICD-10-CM

## 2015-05-02 MED ORDER — FLUCONAZOLE 150 MG PO TABS: ORAL_TABLET | ORAL | Status: DC

## 2015-05-02 NOTE — Patient Instructions (Addendum)
Fem dophilus probiotic ([place])

## 2015-05-02 NOTE — Progress Notes (Signed)
[facility] Visit  Patient name: Tracy [name] MRN [id number]  Date of birth: Dec 09, 1985  CC & HPI:  Tracy [name] is a 30 y.o. Caucasian female presenting today for yeast infection.  She has type 1 diabetes, on dialysis, and gets yeast infections every few months.  She is out of diflucan  Pertinent History Reviewed:  Medical & Surgical Hx:   Past Medical History  Diagnosis Date  . Thyroid enlargement     "not on medication at this time"  . Urinary tract infection     hx of  . Anemia     presently on iron supplement  . Hypothyroidism   . Anxiety   . HSV-2 (herpes simplex virus 2) infection   . HSV-1 (herpes simplex virus 1) infection   . Detached retina   . Yeast infection     took diflucan saturday   . Hypertension   . Irregular periods 07/05/2014  . Vaginal discharge 07/05/2014  . Abscess of right thigh   . Diabetes mellitus     insulin pump  Type 1  . Depression   . Chronic kidney disease     on dialysis T, Th, Sat  . Seizures ([place]) 01/2014    ? due to blood sugar  . Neuropathy ([place])     in feet   Past Surgical History  Procedure Laterality Date  . Cholecystectomy    . Pilonidal cyst excision    . Wisdom tooth extraction    . Pars plana vitrectomy  10/01/2011    Procedure: PARS PLANA VITRECTOMY WITH 25 GAUGE;  Surgeon: Hayden Pedro, MD;  Location: [place];  Service: Ophthalmology;  Laterality: Right;  Repair Complex Traction Retinal Detachment  . Trigger finger release Right 09/21/13  . I&d extremity Right 08/14/2014    Procedure: IRRIGATION AND DEBRIDEMENT EXTREMITY WITH MUSCLE BIOPSY;  Surgeon: Roseanne Kaufman, MD;  Location: [place];  Service: Orthopedics;  Laterality: Right;  . I&d extremity Right 08/20/2014    Procedure:  I AND D LEG / THIGH ABSCESS AND MANIPULATION OF RIGHT ELBOW.;  Surgeon: Renette Butters, MD;  Location: [place];  Service: Orthopedics;  Laterality: Right;  . Av fistula placement Left 09/01/2014    Procedure: BRACHIOCEPHALIC  ARTERIOVENOUS (AV) FISTULA CREATION;  Surgeon: Conrad Ekalaka, MD;  Location: [place];  Service: Vascular;  Laterality: Left;  . Insertion of dialysis catheter N/A 09/01/2014    Procedure: INSERTION OF DIALYSIS CATHETER IN RIGHT INTERNAL JUGUILAR;  Surgeon: Conrad [place], MD;  Location: [place];  Service: Vascular;  Laterality: N/A;  . I&d extremity Right 09/30/2014    Procedure: IRRIGATION AND DEBRIDEMENT RIGHT THIGH ABSCESS;  Surgeon: [name], MD;  Location: [place];  Service: Orthopedics;  Laterality: Right;  . I&d extremity Right 10/03/2014    Procedure: IRRIGATION AND DEBRIDEMENT RIGHT THIGH ABSCESS,WITH WOUND CLOSURE & MANIPULATION OF RIGHT KNEE;  Surgeon: Renette Butters, MD;  Location: [place];  Service: Orthopedics;  Laterality: Right;  . Application of wound vac Right 10/03/2014    Procedure: APPLICATION OF WOUND VAC RIGHT THIGH;  Surgeon: Renette Butters, MD;  Location: [place];  Service: Orthopedics;  Laterality: Right;  . Application of a-cell of extremity Right 10/03/2014    Procedure: APPLICATION OF A-CELL OF RIGHT UPPER THIGH WOUND;  Surgeon: Renette Butters, MD;  Location: [place];  Service: Orthopedics;  Laterality: Right;  . Incision and drainage abscess Right 12/09/2014    Procedure: INCISION AND DRAINAGE  ABSCESS;  Surgeon: Renette Butters, MD;  Location: [place];  Service: Orthopedics;  Laterality: Right;   Family History  Problem Relation Age of Onset  . Cancer Paternal Grandfather     prostate  . Hyperlipidemia Paternal Grandfather   . Stroke Paternal Grandfather     Current outpatient prescriptions:  .  acyclovir (ZOVIRAX) 400 MG tablet, TAKE 1 TABLET (400 MG TOTAL) BY MOUTH 2 (TWO) TIMES DAILY., Disp: 60 tablet, Rfl: 6 .  amLODipine (NORVASC) 10 MG tablet, Take 10 mg by mouth at bedtime. , Disp: , Rfl:  .  Ferric Citrate (AURYXIA) 1 GM 210 MG(FE) TABS, Take 1 tablet by mouth 3 (three) times daily., Disp: , Rfl:  .  gabapentin (NEURONTIN) 100 MG capsule, Take 100 mg by mouth at  bedtime., Disp: , Rfl:  .  insulin aspart (NOVOLOG FLEXPEN) 100 UNIT/ML FlexPen, Inject 7 Units into the skin 3 (three) times daily with meals. (Patient taking differently: Inject 40 Units into the skin daily. No more than 40 units daily via insulin pump), Disp: 15 mL, Rfl: 1 .  Insulin Glargine (LANTUS SOLOSTAR) 100 UNIT/ML Solostar Pen, Inject 28 Units into the skin at bedtime., Disp: 15 mL, Rfl: 11 .  levothyroxine (SYNTHROID, LEVOTHROID) 75 MCG tablet, Take 75 mcg by mouth daily., Disp: , Rfl: 0 .  lidocaine-prilocaine (EMLA) cream, Apply 1 application topically as needed (uses with dialysis)., Disp: , Rfl:  .  multivitamin (RENA-VIT) TABS tablet, Take 1 tablet by mouth at bedtime., Disp: 30 tablet, Rfl: 1 .  ONE TOUCH ULTRA TEST test strip, 1 each by Other route See admin instructions. Check blood sugar 4 times daily, Disp: , Rfl:  .  sertraline (ZOLOFT) 50 MG tablet, Take 50 mg by mouth at bedtime., Disp: , Rfl:  .  fluconazole (DIFLUCAN) 150 MG tablet, 1 po stat; repeat in 3 days, Disp: 2 tablet, Rfl: 2 .  HYDROcodone-acetaminophen (NORCO/VICODIN) 5-325 MG per tablet, Take 1 tablet by mouth 3 (three) times daily as needed for moderate pain. Reported on 05/02/2015, Disp: , Rfl:  Social History: Reviewed -  reports that she has never smoked. She has never used smokeless tobacco.  Review of Systems:   Constitutional: Negative for fever and chills Eyes: Negative for visual disturbances Respiratory: Negative for shortness of breath, dyspnea Cardiovascular: Negative for chest pain or palpitations  Gastrointestinal: Negative for vomiting, diarrhea and constipation; no abdominal pain Genitourinary: Negative for dysuria and urgency, vaginal irritation or itching Musculoskeletal: Negative for back pain, joint pain, myalgias  Neurological: Negative for dizziness and headaches    Objective Findings:    Physical Examination: General appearance - well appearing, and in no distress Mental status  - alert, oriented to person, place, and time Chest:  Normal respiratory effort Heart - normal rate and regular rhythm Abdomen:  Soft, nontender Pelvic: red vulva, discharge c/w yeast Musculoskeletal:  Normal range of motion without pain Extremities:  No edema    No results found for this or any previous visit (from the past 24 hour(s)).    Assessment & Plan:  A:   Yeast infection P:     Meds ordered this encounter  Medications  . fluconazole (DIFLUCAN) 150 MG tablet    Sig: 1 po stat; repeat in 3 days    Dispense:  2 tablet    Refill:  2    Order Specific Question:  Supervising Provider    Answer:  Tania Ade H [[id number]]   Fem Dophilus probiotic recommended  No Follow-up on file.  CRESENZO-DISHMAN,Jasmon Graffam CNM 05/02/2015 9:51 AM

## 2015-05-15 HISTORY — PX: COMBINED KIDNEY-PANCREAS TRANSPLANT: SHX1382

## 2015-05-15 HISTORY — PX: KIDNEY TRANSPLANT: SHX239

## 2015-07-11 ENCOUNTER — Other Ambulatory Visit: Payer: BLUE CROSS/BLUE SHIELD | Admitting: Adult Health

## 2015-08-17 ENCOUNTER — Other Ambulatory Visit: Payer: BLUE CROSS/BLUE SHIELD | Admitting: Adult Health

## 2015-08-18 ENCOUNTER — Encounter: Payer: Self-pay | Admitting: Adult Health

## 2015-08-18 ENCOUNTER — Ambulatory Visit (INDEPENDENT_AMBULATORY_CARE_PROVIDER_SITE_OTHER): Payer: BLUE CROSS/BLUE SHIELD | Admitting: Adult Health

## 2015-08-18 VITALS — BP 120/70 | HR 76 | Ht 64.0 in | Wt 139.5 lb

## 2015-08-18 DIAGNOSIS — Z949 Transplanted organ and tissue status, unspecified: Secondary | ICD-10-CM | POA: Diagnosis not present

## 2015-08-18 DIAGNOSIS — Z01419 Encounter for gynecological examination (general) (routine) without abnormal findings: Secondary | ICD-10-CM

## 2015-08-18 HISTORY — DX: Transplanted organ and tissue status, unspecified: Z94.9

## 2015-08-18 NOTE — Patient Instructions (Signed)
Physical in 1 year, pap 2019 Mammogram at 77

## 2015-08-18 NOTE — Progress Notes (Signed)
Patient ID: Tracy [name], female   DOB: [birth year], 30 y.o.   MRN: [id number] History of Present Illness: Tracy [name] is a 30 year old white female, divorced in for a well woman gyn exam, she had a normal pap with negative HPV 07/05/14.She underwent a pancreas and kidney transplant in April of this year at [facility], her donor was a 31 year old boy, who had fallen and had brain damage, he also had CMV and she is positive now. She is back at work at [employer] in [place] and is riding her horses again.  Current Medications, Allergies, Past Medical History, Past Surgical History, Family History and Social History were reviewed in [employer] record.     Review of Systems:  Patient denies any headaches, hearing loss, fatigue, blurred vision, shortness of breath, chest pain, abdominal pain, problems with bowel movements, urination, or intercourse. No joint pain or mood swings.   Physical Exam:BP 120/70 mmHg  Pulse 76  Ht 5\' 4"  (1.626 m)  Wt 139 lb 8 oz (63.277 kg)  BMI 23.93 kg/m2  LMP 07/28/2015 General:  Well developed, well nourished, no acute distress Skin:  Warm and dry Neck:  Midline trachea, normal thyroid, good ROM, no lymphadenopathy Lungs; Clear to auscultation bilaterally Breast:  No dominant palpable mass, retraction, or nipple discharge Cardiovascular: Regular rate and rhythm Abdomen:  Soft, non tender, no hepatosplenomegaly, has vertical scar, with area at top slow to heal Pelvic:  External genitalia is normal in appearance, no lesions.  The vagina is normal in appearance. Urethra has no lesions or masses. The cervix is smooth.  Uterus is felt to be normal size, shape, and contour.  No adnexal masses or tenderness noted.Bladder is non tender, no masses felt. Extremities/musculoskeletal:  No swelling or varicosities noted, no clubbing or cyanosis Psych:  No mood changes, alert and cooperative,seems happy   Impression: Well woman gyn exam no pap History of  organ transplant    Plan: Physical in 1 year, pap 2019 Mammogram at 30

## 2015-08-20 ENCOUNTER — Emergency Department (HOSPITAL_COMMUNITY): Payer: BLUE CROSS/BLUE SHIELD

## 2015-08-20 ENCOUNTER — Encounter (HOSPITAL_COMMUNITY): Payer: Self-pay

## 2015-08-20 ENCOUNTER — Emergency Department (HOSPITAL_COMMUNITY)
Admission: EM | Admit: 2015-08-20 | Discharge: 2015-08-20 | Disposition: A | Discharge status: Home / Self Care | Payer: BLUE CROSS/BLUE SHIELD | Attending: Emergency Medicine | Admitting: Emergency Medicine

## 2015-08-20 DIAGNOSIS — S93401A Sprain of unspecified ligament of right ankle, initial encounter: Secondary | ICD-10-CM | POA: Diagnosis not present

## 2015-08-20 DIAGNOSIS — Z7982 Long term (current) use of aspirin: Secondary | ICD-10-CM | POA: Diagnosis not present

## 2015-08-20 DIAGNOSIS — E039 Hypothyroidism, unspecified: Secondary | ICD-10-CM | POA: Diagnosis not present

## 2015-08-20 DIAGNOSIS — N189 Chronic kidney disease, unspecified: Secondary | ICD-10-CM | POA: Insufficient documentation

## 2015-08-20 DIAGNOSIS — Z79899 Other long term (current) drug therapy: Secondary | ICD-10-CM | POA: Diagnosis not present

## 2015-08-20 DIAGNOSIS — E1022 Type 1 diabetes mellitus with diabetic chronic kidney disease: Secondary | ICD-10-CM | POA: Diagnosis not present

## 2015-08-20 DIAGNOSIS — Y929 Unspecified place or not applicable: Secondary | ICD-10-CM | POA: Insufficient documentation

## 2015-08-20 DIAGNOSIS — X58XXXA Exposure to other specified factors, initial encounter: Secondary | ICD-10-CM | POA: Insufficient documentation

## 2015-08-20 DIAGNOSIS — F329 Major depressive disorder, single episode, unspecified: Secondary | ICD-10-CM | POA: Diagnosis not present

## 2015-08-20 DIAGNOSIS — Y999 Unspecified external cause status: Secondary | ICD-10-CM | POA: Diagnosis not present

## 2015-08-20 DIAGNOSIS — I129 Hypertensive chronic kidney disease with stage 1 through stage 4 chronic kidney disease, or unspecified chronic kidney disease: Secondary | ICD-10-CM | POA: Insufficient documentation

## 2015-08-20 DIAGNOSIS — Y939 Activity, unspecified: Secondary | ICD-10-CM | POA: Diagnosis not present

## 2015-08-20 DIAGNOSIS — S8011XA Contusion of right lower leg, initial encounter: Secondary | ICD-10-CM

## 2015-08-20 DIAGNOSIS — S99911A Unspecified injury of right ankle, initial encounter: Secondary | ICD-10-CM | POA: Diagnosis present

## 2015-08-20 NOTE — ED Provider Notes (Signed)
CSN: [id number]     Arrival date & time 08/20/15  2044 History   First MD Initiated Contact with Patient 08/20/15 2159     Chief Complaint  Patient presents with  . Ankle Pain     (Consider location/radiation/quality/duration/timing/severity/associated sxs/prior Treatment) HPI Comments: Patient is a 30 year old female who presents to the emergency department with a complaint of ankle and leg injury.  The patient states that around 2 PM today she had an accident with her horse. She is not sure if the horse stepped on her, but she thinks that she sustained one of her bruises from horse possibly stepping on her. She also complains of right ankle pain. Patient denies hitting her head on. She denies back pain at this time. Patient denies being on any anticoagulation medications. It is of note that she is a transplant patient.   Patient is a 30 y.o. female presenting with ankle pain. The history is provided by the patient.  Ankle Pain   Past Medical History  Diagnosis Date  . Thyroid enlargement     "not on medication at this time"  . Urinary tract infection     hx of  . Anemia     presently on iron supplement  . Hypothyroidism   . Anxiety   . HSV-2 (herpes simplex virus 2) infection   . HSV-1 (herpes simplex virus 1) infection   . Detached retina   . Yeast infection     took diflucan saturday   . Hypertension   . Irregular periods 07/05/2014  . Vaginal discharge 07/05/2014  . Abscess of right thigh   . Diabetes mellitus     insulin pump  Type 1  . Depression   . Chronic kidney disease     on dialysis T, Th, Sat  . Seizures ([place]) 01/2014    ? due to blood sugar  . Neuropathy ([place])     in feet  . CMV (cytomegalovirus infection) ([place])   . History of organ transplantation 08/18/2015   Past Surgical History  Procedure Laterality Date  . Cholecystectomy    . Pilonidal cyst excision    . Wisdom tooth extraction    . Pars plana vitrectomy  10/01/2011    Procedure: PARS PLANA  VITRECTOMY WITH 25 GAUGE;  Surgeon: Hayden Pedro, MD;  Location: [place];  Service: Ophthalmology;  Laterality: Right;  Repair Complex Traction Retinal Detachment  . Trigger finger release Right 09/21/13  . I&d extremity Right 08/14/2014    Procedure: IRRIGATION AND DEBRIDEMENT EXTREMITY WITH MUSCLE BIOPSY;  Surgeon: Roseanne Kaufman, MD;  Location: [place];  Service: Orthopedics;  Laterality: Right;  . I&d extremity Right 08/20/2014    Procedure:  I AND D LEG / THIGH ABSCESS AND MANIPULATION OF RIGHT ELBOW.;  Surgeon: Renette Butters, MD;  Location: [place];  Service: Orthopedics;  Laterality: Right;  . Av fistula placement Left 09/01/2014    Procedure: BRACHIOCEPHALIC ARTERIOVENOUS (AV) FISTULA CREATION;  Surgeon: Conrad [place], MD;  Location: [place];  Service: Vascular;  Laterality: Left;  . Insertion of dialysis catheter N/A 09/01/2014    Procedure: INSERTION OF DIALYSIS CATHETER IN RIGHT INTERNAL JUGUILAR;  Surgeon: Conrad Elmer, MD;  Location: [place];  Service: Vascular;  Laterality: N/A;  . I&d extremity Right 09/30/2014    Procedure: IRRIGATION AND DEBRIDEMENT RIGHT THIGH ABSCESS;  Surgeon: [name], MD;  Location: [place];  Service: Orthopedics;  Laterality: Right;  . I&d extremity Right 10/03/2014    Procedure: IRRIGATION  AND DEBRIDEMENT RIGHT THIGH ABSCESS,WITH WOUND CLOSURE & MANIPULATION OF RIGHT KNEE;  Surgeon: Renette Butters, MD;  Location: [place];  Service: Orthopedics;  Laterality: Right;  . Application of wound vac Right 10/03/2014    Procedure: APPLICATION OF WOUND VAC RIGHT THIGH;  Surgeon: Renette Butters, MD;  Location: [place];  Service: Orthopedics;  Laterality: Right;  . Application of a-cell of extremity Right 10/03/2014    Procedure: APPLICATION OF A-CELL OF RIGHT UPPER THIGH WOUND;  Surgeon: Renette Butters, MD;  Location: [place];  Service: Orthopedics;  Laterality: Right;  . Incision and drainage abscess Right 12/09/2014    Procedure: INCISION AND DRAINAGE ABSCESS;  Surgeon: Renette Butters, MD;  Location: [place];  Service: Orthopedics;  Laterality: Right;  . Kidney transplant  05/15/15  . 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 Cancer Paternal Grandfather     prostate  . Hyperlipidemia Paternal Grandfather   . Stroke Paternal Grandfather    Social History  Substance Use Topics  . Smoking status: Never Smoker   . Smokeless tobacco: Never Used  . Alcohol Use: No   OB History    Gravida Para Term Preterm AB TAB SAB Ectopic Multiple Living   2    2  2         Review of Systems  Musculoskeletal: Positive for arthralgias.  Psychiatric/Behavioral: The patient is nervous/anxious.   All other systems reviewed and are negative.     Allergies  Penicillins and Sulfa antibiotics  Home Medications   Prior to Admission medications   Medication Sig Start Date End Date Taking? Authorizing Provider  acetaminophen (TYLENOL) 500 MG tablet Take 1,000 mg by mouth every 6 (six) hours as needed for mild pain or moderate pain.   Yes Historical Provider, MD  aspirin EC 81 MG tablet Take 81 mg by mouth daily.  05/18/15  Yes Historical Provider, MD  dapsone 25 MG tablet Take 50 mg by mouth daily. 08/09/15  Yes Historical Provider, MD  fludrocortisone (FLORINEF) 0.1 MG tablet Take 0.2 mg by mouth at bedtime.  08/09/15  Yes Historical Provider, MD  levothyroxine (SYNTHROID, LEVOTHROID) 75 MCG tablet Take 75 mcg by mouth daily. 09/02/14  Yes Historical Provider, MD  Multiple Vitamins-Minerals (MULTIVITAMIN WITH MINERALS) tablet Take 1 tablet by mouth daily.    Yes Historical Provider, MD  mycophenolate (MYFORTIC) 180 MG EC tablet Take 180 mg by mouth 2 (two) times daily.  08/09/15  Yes Historical Provider, MD  predniSONE (DELTASONE) 5 MG tablet Take 5 mg by mouth daily with breakfast.  08/03/15  Yes Historical Provider, MD  tacrolimus (PROGRAF) 1 MG capsule Take 3 mg by mouth 2 (two) times daily. 3 tabs BID 08/09/15  Yes Historical  Provider, MD  valGANciclovir (VALCYTE) 450 MG tablet Take 900 mg by mouth 2 (two) times daily.  08/03/15  Yes Historical Provider, MD   BP 132/70 mmHg  Pulse 80  Temp(Src) 98.2 F (36.8 C) (Oral)  Resp 16  Ht 5\' 3"  (1.6 m)  Wt 63.504 kg  BMI 24.81 kg/m2  SpO2 100%  LMP 07/28/2015 Physical Exam  Constitutional: She is oriented to person, place, and time. She appears well-developed and well-nourished.  Non-toxic appearance.  HENT:  Head: Normocephalic.  Right Ear: Tympanic membrane and external ear normal.  Left Ear: Tympanic membrane and external ear normal.  Eyes: EOM and lids are normal. Pupils are equal, round, and reactive to light.  Neck: Normal range of motion. Neck supple. Carotid bruit is not present.  Cardiovascular: Normal rate, regular rhythm, normal heart sounds, intact distal pulses and normal pulses.   Pulmonary/Chest: Breath sounds normal. No respiratory distress.  Abdominal: Soft. Bowel sounds are normal. There is no tenderness. There is no guarding.  Musculoskeletal: Normal range of motion.  There is good range of motion of the right hip. There is a bruise on the right thigh just above the knee,  more lateral on than medial. Compartments are soft. There is no deformity of the tibial area. There is pain with range of motion involving the right ankle on. The dorsalis pedis pulses 2+. The capillary refill is less than 2 seconds.  Lymphadenopathy:       Head (right side): No submandibular adenopathy present.       Head (left side): No submandibular adenopathy present.    She has no cervical adenopathy.  Neurological: She is alert and oriented to person, place, and time. She has normal strength. No cranial nerve deficit or sensory deficit.  Skin: Skin is warm and dry.  Psychiatric: She has a normal mood and affect. Her speech is normal.  Nursing note and vitals reviewed.   ED Course  Procedures (including critical care time) Labs Review Labs Reviewed - No data to  display  Imaging Review Dg Ankle Complete Right  08/20/2015  CLINICAL DATA:  Right ankle pain and right foot pain after injury today around 2 p.m. Horse may have stepped on it. EXAM: RIGHT ANKLE - COMPLETE 3+ VIEW COMPARISON:  Right ankle 06/21/2011 FINDINGS: There is no evidence of fracture, dislocation, or joint effusion. There is no evidence of arthropathy or other focal bone abnormality. Soft tissues are unremarkable. Prominent vascular calcifications. IMPRESSION: No acute bony abnormalities. Electronically Signed   By: Lucienne Capers M.D.   On: 08/20/2015 21:52   Dg Foot Complete Right  08/20/2015  CLINICAL DATA:  Right ankle pain and right foot pain after injury today. EXAM: RIGHT FOOT COMPLETE - 3+ VIEW COMPARISON:  Right ankle 06/21/2011 FINDINGS: There is no evidence of fracture or dislocation. There is no evidence of arthropathy or other focal bone abnormality. Soft tissues are unremarkable. Prominent soft tissue calcifications. IMPRESSION: No acute bony abnormalities. Electronically Signed   By: Lucienne Capers M.D.   On: 08/20/2015 21:54   I have personally reviewed and evaluated these images and lab results as part of my medical decision-making.   EKG Interpretation None      MDM  Vital signs within normal limits. X-ray of the right ankle is negative for acute abnormality. X-ray of the right foot is negative for acute abnormality. Patient was fitted with an ankle stirrup splint. Ice pack applied for ankle sprain on. The patient acknowledges understanding of the discharge instructions and is in agreement. Patient will follow with orthopedics if not improving.    Final diagnoses:  None    **I have reviewed nursing notes, vital signs, and all appropriate lab and imaging results for this patient.Lily Kocher, PA-C 08/22/15 [id number]  Sherwood Gambler, MD 08/25/15 [phone]

## 2015-08-20 NOTE — ED Notes (Signed)
Ankle injury today around 2 pm.  Not sure if the horse stepped on it or what.  Able to walk on it half-way.

## 2015-08-20 NOTE — Discharge Instructions (Signed)
Your vital signs are within normal limits. Your x-rays are negative for fracture or dislocation. Please apply ice to your bruise on the upper leg, and your ankle. Please elevate your leg is much as possible. Please see your primary physician, or return to the emergency department if any changes, problems, or concerns. Please use your ankle stirrup splint over the next 5-7 days. Ankle Sprain An ankle sprain is an injury to the strong, fibrous tissues (ligaments) that hold the bones of your ankle joint together.  CAUSES An ankle sprain is usually caused by a fall or by twisting your ankle. Ankle sprains most commonly occur when you step on the outer edge of your foot, and your ankle turns inward. People who participate in sports are more prone to these types of injuries.  SYMPTOMS   Pain in your ankle. The pain may be present at rest or only when you are trying to stand or walk.  Swelling.  Bruising. Bruising may develop immediately or within 1 to 2 days after your injury.  Difficulty standing or walking, particularly when turning corners or changing directions. DIAGNOSIS  Your caregiver will ask you details about your injury and perform a physical exam of your ankle to determine if you have an ankle sprain. During the physical exam, your caregiver will press on and apply pressure to specific areas of your foot and ankle. Your caregiver will try to move your ankle in certain ways. An X-ray exam may be done to be sure a bone was not broken or a ligament did not separate from one of the bones in your ankle (avulsion fracture).  TREATMENT  Certain types of braces can help stabilize your ankle. Your caregiver can make a recommendation for this. Your caregiver may recommend the use of medicine for pain. If your sprain is severe, your caregiver may refer you to a surgeon who helps to restore function to parts of your skeletal system (orthopedist) or a physical therapist. [place]  ice to your injury for 1-2 days or as directed by your caregiver. Applying ice helps to reduce inflammation and pain.  Put ice in a plastic bag.  Place a towel between your skin and the bag.  Leave the ice on for 15-20 minutes at a time, every 2 hours while you are awake.  Only take over-the-counter or prescription medicines for pain, discomfort, or fever as directed by your caregiver.  Elevate your injured ankle above the level of your heart as much as possible for 2-3 days.  If your caregiver recommends crutches, use them as instructed. Gradually put weight on the affected ankle. Continue to use crutches or a cane until you can walk without feeling pain in your ankle.  If you have a plaster splint, wear the splint as directed by your caregiver. Do not rest it on anything harder than a pillow for the first 24 hours. Do not put weight on it. Do not get it wet. You may take it off to take a shower or bath.  You may have been given an elastic bandage to wear around your ankle to provide support. If the elastic bandage is too tight (you have numbness or tingling in your foot or your foot becomes cold and blue), adjust the bandage to make it comfortable.  If you have an air splint, you may blow more air into it or let air out to make it more comfortable. You may take your splint off at night and before  taking a shower or bath. Wiggle your toes in the splint several times per day to decrease swelling. SEEK MEDICAL CARE IF:   You have rapidly increasing bruising or swelling.  Your toes feel extremely cold or you lose feeling in your foot.  Your pain is not relieved with medicine. SEEK IMMEDIATE MEDICAL CARE IF:  Your toes are numb or blue.  You have severe pain that is increasing. MAKE SURE YOU:   Understand these instructions.  Will watch your condition.  Will get help right away if you are not doing well or get worse.   This information is not intended to replace advice given to  you by your health care provider. Make sure you discuss any questions you have with your health care provider.   Document Released: 01/28/2005 Document Revised: 02/18/2014 Document Reviewed: 02/09/2011 Elsevier Interactive Patient Education 2016 [place] A contusion is a deep bruise. Contusions are the result of a blunt injury to tissues and muscle fibers under the skin. The injury causes bleeding under the skin. The skin overlying the contusion may turn blue, purple, or yellow. Minor injuries will give you a painless contusion, but more severe contusions may stay painful and swollen for a few weeks.  CAUSES  This condition is usually caused by a blow, trauma, or direct force to an area of the body. SYMPTOMS  Symptoms of this condition include:  Swelling of the injured area.  Pain and tenderness in the injured area.  Discoloration. The area may have redness and then turn blue, purple, or yellow. DIAGNOSIS  This condition is diagnosed based on a physical exam and medical history. An X-ray, CT scan, or MRI may be needed to determine if there are any associated injuries, such as broken bones (fractures). TREATMENT  Specific treatment for this condition depends on what area of the body was injured. In general, the best treatment for a contusion is resting, icing, applying pressure to (compression), and elevating the injured area. This is often called the RICE strategy. Over-the-counter anti-inflammatory medicines may also be recommended for pain control.  HOME CARE INSTRUCTIONS   Rest the injured area.  If directed, apply ice to the injured area:  Put ice in a plastic bag.  Place a towel between your skin and the bag.  Leave the ice on for 20 minutes, 2-3 times per day.  If directed, apply light compression to the injured area using an elastic bandage. Make sure the bandage is not wrapped too tightly. Remove and reapply the bandage as directed by your health care  provider.  If possible, raise (elevate) the injured area above the level of your heart while you are sitting or lying down.  Take over-the-counter and prescription medicines only as told by your health care provider. SEEK MEDICAL CARE IF:  Your symptoms do not improve after several days of treatment.  Your symptoms get worse.  You have difficulty moving the injured area. SEEK IMMEDIATE MEDICAL CARE IF:   You have severe pain.  You have numbness in a hand or foot.  Your hand or foot turns pale or cold.   This information is not intended to replace advice given to you by your health care provider. Make sure you discuss any questions you have with your health care provider.   Document Released: 11/07/2004 Document Revised: 10/19/2014 Document Reviewed: 06/15/2014 Elsevier Interactive Patient Education [employer].

## 2015-08-30 ENCOUNTER — Telehealth: Payer: Self-pay | Admitting: *Deleted

## 2015-08-30 NOTE — Telephone Encounter (Signed)
Pt states she has several questions in regards to restarting depo. Was told by front staff would need to have [facility] and then shot in pm, pt state she gets blood work done at [facility] and would like to have the [facility] done there. Informed pt that would have to discuss with Derrek Monaco, NP.

## 2015-08-31 NOTE — Telephone Encounter (Signed)
Pt informed can get [facility] done a [facility] on 10/01/2015 at 8 am just need to have results with her when she comes in pm for Depo injection. Pt verbalized understanding.

## 2015-08-31 NOTE — Telephone Encounter (Signed)
Ok, yes she can get the hcg anytime but certainly as pt would like

## 2015-09-11 ENCOUNTER — Ambulatory Visit: Payer: Medicare Other

## 2015-09-11 ENCOUNTER — Telehealth: Payer: Self-pay | Admitting: *Deleted

## 2015-09-11 MED ORDER — MEDROXYPROGESTERONE ACETATE 150 MG/ML IM SUSP: 150.0000 mg | INTRAMUSCULAR | 4 refills | Status: DC

## 2015-09-11 NOTE — Telephone Encounter (Signed)
Will rx depo

## 2015-09-13 ENCOUNTER — Ambulatory Visit: Payer: Self-pay

## 2015-09-13 ENCOUNTER — Ambulatory Visit: Payer: Medicare Other

## 2015-09-18 ENCOUNTER — Encounter: Payer: Self-pay | Admitting: *Deleted

## 2015-09-18 ENCOUNTER — Ambulatory Visit (INDEPENDENT_AMBULATORY_CARE_PROVIDER_SITE_OTHER): Payer: BLUE CROSS/BLUE SHIELD | Admitting: *Deleted

## 2015-09-18 DIAGNOSIS — Z3042 Encounter for surveillance of injectable contraceptive: Secondary | ICD-10-CM

## 2015-09-18 MED ORDER — MEDROXYPROGESTERONE ACETATE 150 MG/ML IM SUSP
150.0000 mg | Freq: Once | INTRAMUSCULAR | Status: AC
  Administered 2015-09-18: 150 mg via INTRAMUSCULAR

## 2015-09-18 NOTE — Progress Notes (Signed)
Pt here for Depo. Pt tolerated shot well. Return in 12 weeks for next shot. JSY 

## 2015-09-19 ENCOUNTER — Encounter: Payer: Self-pay | Admitting: Adult Health

## 2015-09-20 ENCOUNTER — Ambulatory Visit: Payer: Medicare Other

## 2015-12-11 ENCOUNTER — Telehealth: Payer: Self-pay | Admitting: *Deleted

## 2015-12-11 NOTE — Telephone Encounter (Signed)
Spoke with pt letting her know she has refills on Depo. Pt to call pharmacy and get it filled. [place]

## 2015-12-12 ENCOUNTER — Encounter: Payer: Self-pay | Admitting: *Deleted

## 2015-12-12 ENCOUNTER — Ambulatory Visit (INDEPENDENT_AMBULATORY_CARE_PROVIDER_SITE_OTHER): Payer: BLUE CROSS/BLUE SHIELD | Admitting: *Deleted

## 2015-12-12 DIAGNOSIS — Z3202 Encounter for pregnancy test, result negative: Secondary | ICD-10-CM

## 2015-12-12 DIAGNOSIS — Z308 Encounter for other contraceptive management: Secondary | ICD-10-CM | POA: Diagnosis not present

## 2015-12-12 LAB — POCT URINE PREGNANCY: Preg Test, Ur: NEGATIVE

## 2015-12-12 MED ORDER — MEDROXYPROGESTERONE ACETATE 150 MG/ML IM SUSP
150.0000 mg | Freq: Once | INTRAMUSCULAR | Status: AC
  Administered 2015-12-12: 150 mg via INTRAMUSCULAR

## 2015-12-12 NOTE — Progress Notes (Signed)
Pt here for Depo. Pt tolerated shot well. Pt mentioned some bleeding. I advised to let us know if the shot didn't stop the bleeding or if bleeding got worse. Pt voiced understanding. Return in 12 weeks for next shot. [place]

## 2015-12-29 ENCOUNTER — Telehealth: Payer: Self-pay | Admitting: Adult Health

## 2015-12-29 NOTE — Telephone Encounter (Signed)
Spoke with pt letting her know JAG recommends she be seen either here or at Urgent Care. Pt works until 6 so she will go to Urgent Care afterwards. [place]

## 2015-12-29 NOTE — Telephone Encounter (Signed)
Spoke with pt. Pt is having low stomach cramps then after a few days, she starts bleeding, dark blood. She had her 2nd Depo on 12/12/15. She also has cloudy urine with odor and low back pain. Hot and cold spells, unsure if has temp. She is at work.  She was given Nitrofurantoin 100 mg from [place] last time she had a UTI. Please advise. Thanks!! [place]

## 2016-01-02 ENCOUNTER — Telehealth: Payer: Self-pay | Admitting: Adult Health

## 2016-01-02 ENCOUNTER — Telehealth: Payer: Self-pay | Admitting: *Deleted

## 2016-01-02 NOTE — Telephone Encounter (Signed)
Pt c/o BTB with Depo provera, is this normal. Pt informed can take up to 3-6 mos before she will not have BTB. Pt verbalized understanding and requested to cancel her appt for 01/08/2016 with Derrek Monaco, NP.

## 2016-01-02 NOTE — Telephone Encounter (Signed)
Pt called stating that she has been bleeding since she got her Depo shot and she would like to know if that is normal. Pt made an appointment but would like a call back from the nurse. Please contact pt

## 2016-01-08 ENCOUNTER — Ambulatory Visit: Payer: Medicare Other | Admitting: Adult Health

## 2016-01-18 ENCOUNTER — Encounter: Payer: Self-pay | Admitting: Adult Health

## 2016-01-18 ENCOUNTER — Ambulatory Visit (INDEPENDENT_AMBULATORY_CARE_PROVIDER_SITE_OTHER): Payer: BLUE CROSS/BLUE SHIELD | Admitting: Adult Health

## 2016-01-18 VITALS — BP 120/70 | HR 82 | Ht 62.0 in | Wt 138.0 lb

## 2016-01-18 DIAGNOSIS — N938 Other specified abnormal uterine and vaginal bleeding: Secondary | ICD-10-CM

## 2016-01-18 DIAGNOSIS — N921 Excessive and frequent menstruation with irregular cycle: Secondary | ICD-10-CM | POA: Insufficient documentation

## 2016-01-18 DIAGNOSIS — F419 Anxiety disorder, unspecified: Secondary | ICD-10-CM | POA: Diagnosis not present

## 2016-01-18 MED ORDER — MEGESTROL ACETATE 40 MG PO TABS: ORAL_TABLET | ORAL | 1 refills | Status: DC

## 2016-01-18 MED ORDER — LORAZEPAM 0.5 MG PO TABS: 0.5000 mg | ORAL_TABLET | Freq: Three times a day (TID) | ORAL | 0 refills | Status: DC

## 2016-01-18 NOTE — Progress Notes (Signed)
Subjective:     Patient ID: Tracy [name], female   DOB: [birth year], 30 y.o.   MRN: [id number]  HPI Tracy [name] is a 30 year old white female in complaining of irregular bleeding with depo, has had 2 shots, and bleeding has been on and off since October, no pain with bleeding but some discomfort with sex at times. Was seen in [place] regarding transplant and things are going well, has had more UTIs recently, and they may refer to urologist.   Review of Systems Irregular bleeding discomfort with sex at times Reviewed past medical,surgical, social and family history. Reviewed medications and allergies.     Objective:   Physical Exam BP 120/70 (BP Location: Left Arm, Patient Position: Sitting, Cuff Size: Normal)   Pulse 82   Ht 5\' 2"  (1.575 m)   Wt 138 lb (62.6 kg)   BMI 25.24 kg/m  Skin warm and dry.Pelvic: external genitalia is normal in appearance no lesions, vagina: period like blood, no odor,urethra has no lesions or masses noted, cervix:smooth, uterus: normal size, shape and contour, non tender, no masses felt, adnexa: no masses or tenderness noted. Bladder is non tender and no masses felt.    PHQ 9 score 8, she is not depressed, but thinks it is anxiety, would like to try something as needed not daily. Try changing positions with sex. Will try megace to stop bleeding and then get next shot as scheduled.   Assessment:     1. Irregular intermenstrual bleeding   2. Anxiety       Plan:     Meds ordered this encounter  Medications  . megestrol (MEGACE) 40 MG tablet    Sig: Take 3 x 5 days then 2 x 5 days then 1 daily    Dispense:  45 tablet    Refill:  1    Order Specific Question:   Supervising Provider    Answer:   Elonda Husky, LUTHER H [[id number]]  . LORazepam (ATIVAN) 0.5 MG tablet    Sig: Take 1 tablet (0.5 mg total) by mouth every 8 (eight) hours.    Dispense:  30 tablet    Refill:  0    Order Specific Question:   Supervising Provider    Answer:   Florian Buff [[id number]]  Get  depo in January as scheduled

## 2016-01-18 NOTE — Patient Instructions (Signed)
Take megace Get depo in January

## 2016-01-26 ENCOUNTER — Telehealth: Payer: Self-pay | Admitting: Adult Health

## 2016-01-26 MED ORDER — ESCITALOPRAM OXALATE 10 MG PO TABS: 10.0000 mg | ORAL_TABLET | Freq: Every day | ORAL | 3 refills | Status: DC

## 2016-01-26 NOTE — Telephone Encounter (Signed)
Feels like ativan not helping will put on lexapro

## 2016-02-08 ENCOUNTER — Other Ambulatory Visit: Payer: Self-pay | Admitting: Adult Health

## 2016-02-27 ENCOUNTER — Other Ambulatory Visit: Payer: Self-pay | Admitting: Adult Health

## 2016-03-13 ENCOUNTER — Ambulatory Visit: Payer: Medicare Other

## 2016-03-13 ENCOUNTER — Ambulatory Visit (INDEPENDENT_AMBULATORY_CARE_PROVIDER_SITE_OTHER): Payer: BLUE CROSS/BLUE SHIELD | Admitting: *Deleted

## 2016-03-13 ENCOUNTER — Encounter: Payer: Self-pay | Admitting: *Deleted

## 2016-03-13 ENCOUNTER — Ambulatory Visit: Payer: BLUE CROSS/BLUE SHIELD

## 2016-03-13 DIAGNOSIS — Z308 Encounter for other contraceptive management: Secondary | ICD-10-CM

## 2016-03-13 DIAGNOSIS — Z3042 Encounter for surveillance of injectable contraceptive: Secondary | ICD-10-CM | POA: Diagnosis not present

## 2016-03-13 DIAGNOSIS — Z3202 Encounter for pregnancy test, result negative: Secondary | ICD-10-CM | POA: Diagnosis not present

## 2016-03-13 LAB — POCT URINE PREGNANCY: PREG TEST UR: NEGATIVE

## 2016-03-13 MED ORDER — MEDROXYPROGESTERONE ACETATE 150 MG/ML IM SUSP
150.0000 mg | Freq: Once | INTRAMUSCULAR | Status: AC
  Administered 2016-03-13: 150 mg via INTRAMUSCULAR

## 2016-03-13 NOTE — Progress Notes (Signed)
Pt here for Depo. Pt tolerated shot well. Return in 12 weeks for next shot. JSY 

## 2016-03-15 ENCOUNTER — Other Ambulatory Visit: Payer: Self-pay | Admitting: Adult Health

## 2016-03-26 ENCOUNTER — Telehealth: Payer: Self-pay | Admitting: *Deleted

## 2016-03-26 MED ORDER — VALACYCLOVIR HCL 1 G PO TABS
ORAL_TABLET | ORAL | 6 refills | Status: DC
Start: 2016-03-26 — End: 2016-05-27

## 2016-03-26 NOTE — Telephone Encounter (Signed)
Left message that Rx sent in for cold sore.

## 2016-03-26 NOTE — Telephone Encounter (Signed)
Patient would like for prescription for Acyclovir 400mg  sent to pharmacy. She has fever blisters and tried Abreva and Carmax and has had no relief. Please advise.

## 2016-03-27 ENCOUNTER — Telehealth: Payer: Self-pay | Admitting: Adult Health

## 2016-03-27 NOTE — Telephone Encounter (Signed)
Having bleeding and clots on depo, make [place] appt

## 2016-03-29 ENCOUNTER — Other Ambulatory Visit: Payer: Self-pay | Admitting: Adult Health

## 2016-03-29 DIAGNOSIS — N921 Excessive and frequent menstruation with irregular cycle: Secondary | ICD-10-CM

## 2016-04-01 ENCOUNTER — Ambulatory Visit (INDEPENDENT_AMBULATORY_CARE_PROVIDER_SITE_OTHER): Payer: BLUE CROSS/BLUE SHIELD

## 2016-04-01 ENCOUNTER — Telehealth: Payer: Self-pay | Admitting: Adult Health

## 2016-04-01 DIAGNOSIS — N854 Malposition of uterus: Secondary | ICD-10-CM

## 2016-04-01 DIAGNOSIS — N921 Excessive and frequent menstruation with irregular cycle: Secondary | ICD-10-CM | POA: Diagnosis not present

## 2016-04-01 MED ORDER — MEGESTROL ACETATE 40 MG PO TABS: ORAL_TABLET | ORAL | 1 refills | Status: DC

## 2016-04-01 NOTE — Telephone Encounter (Signed)
Pt aware [place] was normal, will rx megace to stop bleeding with depo

## 2016-04-01 NOTE — Progress Notes (Signed)
PELVIC US TA/TV: homogeneous anteverted uterus,wnl,EEC 2.6 mm,normal ov's bialt,ovaries appear mobile,no free fluid,no pain during ultrasound

## 2016-04-02 ENCOUNTER — Other Ambulatory Visit: Payer: Medicare Other

## 2016-04-03 ENCOUNTER — Telehealth: Payer: Self-pay | Admitting: *Deleted

## 2016-04-03 NOTE — Telephone Encounter (Signed)
Called megace in to [facility]

## 2016-04-04 ENCOUNTER — Other Ambulatory Visit: Payer: Medicare Other

## 2016-04-05 ENCOUNTER — Telehealth: Payer: Self-pay | Admitting: Adult Health

## 2016-04-05 NOTE — Telephone Encounter (Signed)
Pt called stating that she needs a refill of her medication and that it was going to be send to the pharmacy in [place] but pt is on her way home because she does not feel well. Pt would like her medication called to the pharmacy in [place]. Please contact pt

## 2016-04-08 NOTE — Telephone Encounter (Signed)
Pt states she needs her RX for Ativan, she has been out since Friday and can really tell a difference.  [facility] [place] on Thursday but pharmacy stating they did not have it, gave them a verbal RX and pt informed.

## 2016-04-15 ENCOUNTER — Telehealth: Payer: Self-pay | Admitting: *Deleted

## 2016-04-15 MED ORDER — FLUCONAZOLE 150 MG PO TABS: ORAL_TABLET | ORAL | 1 refills | Status: DC

## 2016-04-15 NOTE — Telephone Encounter (Signed)
Will rx diflucan  

## 2016-05-03 ENCOUNTER — Other Ambulatory Visit: Payer: Self-pay | Admitting: Adult Health

## 2016-05-08 ENCOUNTER — Other Ambulatory Visit: Payer: Self-pay | Admitting: Adult Health

## 2016-05-27 ENCOUNTER — Telehealth: Payer: Self-pay | Admitting: *Deleted

## 2016-05-27 MED ORDER — VALACYCLOVIR HCL 1 G PO TABS: ORAL_TABLET | ORAL | 6 refills | Status: DC

## 2016-05-27 NOTE — Telephone Encounter (Signed)
Patient called stating she only wanted to speak with you but did say she had an "outbreak". Please advise.

## 2016-05-27 NOTE — Telephone Encounter (Signed)
Needs refill on valtrex has outbreak

## 2016-06-03 ENCOUNTER — Other Ambulatory Visit: Payer: Self-pay | Admitting: Adult Health

## 2016-06-04 ENCOUNTER — Telehealth: Payer: Self-pay | Admitting: Adult Health

## 2016-06-04 MED ORDER — FLUCONAZOLE 150 MG PO TABS: ORAL_TABLET | ORAL | 1 refills | Status: DC

## 2016-06-04 NOTE — Telephone Encounter (Signed)
Pt aware [facility] [address] [facility] [address] [facility]

## 2016-06-05 ENCOUNTER — Ambulatory Visit (INDEPENDENT_AMBULATORY_CARE_PROVIDER_SITE_OTHER): Payer: BLUE CROSS/BLUE SHIELD | Admitting: Adult Health

## 2016-06-05 ENCOUNTER — Encounter: Payer: Self-pay | Admitting: *Deleted

## 2016-06-05 VITALS — BP 118/62 | HR 108 | Ht 63.0 in | Wt 147.8 lb

## 2016-06-05 DIAGNOSIS — R319 Hematuria, unspecified: Secondary | ICD-10-CM | POA: Diagnosis not present

## 2016-06-05 DIAGNOSIS — Z3042 Encounter for surveillance of injectable contraceptive: Secondary | ICD-10-CM | POA: Diagnosis not present

## 2016-06-05 DIAGNOSIS — Z3202 Encounter for pregnancy test, result negative: Secondary | ICD-10-CM | POA: Diagnosis not present

## 2016-06-05 DIAGNOSIS — R8290 Unspecified abnormal findings in urine: Secondary | ICD-10-CM | POA: Diagnosis not present

## 2016-06-05 DIAGNOSIS — N390 Urinary tract infection, site not specified: Secondary | ICD-10-CM | POA: Diagnosis not present

## 2016-06-05 LAB — POCT URINALYSIS DIPSTICK
GLUCOSE UA: NEGATIVE
Ketones, UA: NEGATIVE
NITRITE UA: POSITIVE

## 2016-06-05 LAB — POCT URINE PREGNANCY: Preg Test, Ur: NEGATIVE

## 2016-06-05 MED ORDER — MEDROXYPROGESTERONE ACETATE 150 MG/ML IM SUSP
150.0000 mg | Freq: Once | INTRAMUSCULAR | Status: AC
  Administered 2016-06-05: 150 mg via INTRAMUSCULAR

## 2016-06-05 MED ORDER — CIPROFLOXACIN HCL 500 MG PO TABS: 500.0000 mg | ORAL_TABLET | Freq: Two times a day (BID) | ORAL | 0 refills | Status: DC

## 2016-06-05 NOTE — Progress Notes (Signed)
Pt given Depo Provera 150mg  IM right deltoid without complications. Advised pt to return in 12 weeks for next injection. Pt verbalized understanding.

## 2016-06-05 NOTE — Progress Notes (Signed)
Subjective:     Patient ID: Tracy [name], female   DOB: 30-Nov-1985, 31 y.o.   MRN: [id number]  HPI Tracy [name] is a 31 year old white female in for her depo injection and complained of bad odor with urine, no burning or back pain and has spotting with depo, but has megace to take when happens.  Review of Systems Bad odor to urine Spotting with depo No back pain or burning with urination  Reviewed past medical,surgical, social and family history. Reviewed medications and allergies.     Objective:   Physical Exam BP 118/62 (BP Location: Right Arm, Patient Position: Sitting, Cuff Size: Normal)   Pulse (!) 108   Ht 5\' 3"  (1.6 m)   Wt 147 lb 12.8 oz (67 kg)   LMP  (LMP Unknown)   BMI 26.18 kg/m UPT negative, urine dipstick +nitrates and blood,Skin warm and dry. Lungs: clear to ausculation bilaterally. Cardiovascular: regular rate and rhythm.   No CVAT.  Assessment:          1. Bad odor of urine   2. Surveillance for Depo-Provera contraception   3. Pregnancy examination or test, negative result   4. Urinary tract infection with hematuria, site unspecified    Plan:     Meds ordered this encounter  Medications  . medroxyPROGESTERone (DEPO-PROVERA) injection 150 mg  . ciprofloxacin (CIPRO) 500 MG tablet    Sig: Take 1 tablet (500 mg total) by mouth 2 (two) times daily.    Dispense:  14 tablet    Refill:  0    Order Specific Question:   Supervising Provider    Answer:   Florian Buff [[id number]]  Urine sent for culture Push fluids Return in 12 weeks for next depo

## 2016-06-07 LAB — URINE CULTURE

## 2016-06-10 ENCOUNTER — Telehealth: Payer: Self-pay | Admitting: Adult Health

## 2016-06-10 MED ORDER — NITROFURANTOIN MONOHYD MACRO 100 MG PO CAPS: 100.0000 mg | ORAL_CAPSULE | Freq: Two times a day (BID) | ORAL | 0 refills | Status: DC

## 2016-06-10 NOTE — Telephone Encounter (Signed)
Left message that + Ecoli, not sensitive to cipro, will rx macrobid

## 2016-06-21 ENCOUNTER — Other Ambulatory Visit: Payer: Self-pay | Admitting: Adult Health

## 2016-06-24 ENCOUNTER — Other Ambulatory Visit: Payer: Self-pay | Admitting: Adult Health

## 2016-06-26 ENCOUNTER — Telehealth: Payer: Self-pay | Admitting: Adult Health

## 2016-06-26 NOTE — Telephone Encounter (Signed)
Feels like lexapro not working and even ativan not helping, so increase lexapro to 20 mg for 4-5 days and let me know if better

## 2016-07-04 ENCOUNTER — Other Ambulatory Visit: Payer: Self-pay | Admitting: Adult Health

## 2016-07-17 ENCOUNTER — Telehealth: Payer: Self-pay | Admitting: *Deleted

## 2016-07-17 NOTE — Telephone Encounter (Signed)
Spoke with pt letting her know to try Monistat OTC first and if that don't help, let us know. Pt voiced understanding. [place]

## 2016-07-18 ENCOUNTER — Other Ambulatory Visit: Payer: Self-pay | Admitting: Adult Health

## 2016-07-23 ENCOUNTER — Other Ambulatory Visit: Payer: Self-pay | Admitting: Obstetrics & Gynecology

## 2016-07-30 ENCOUNTER — Ambulatory Visit (INDEPENDENT_AMBULATORY_CARE_PROVIDER_SITE_OTHER): Payer: BLUE CROSS/BLUE SHIELD | Admitting: Adult Health

## 2016-07-30 ENCOUNTER — Encounter: Payer: Self-pay | Admitting: Adult Health

## 2016-07-30 VITALS — BP 116/64 | HR 85 | Ht 62.0 in | Wt 151.0 lb

## 2016-07-30 DIAGNOSIS — N760 Acute vaginitis: Secondary | ICD-10-CM

## 2016-07-30 DIAGNOSIS — N898 Other specified noninflammatory disorders of vagina: Secondary | ICD-10-CM

## 2016-07-30 DIAGNOSIS — B9689 Other specified bacterial agents as the cause of diseases classified elsewhere: Secondary | ICD-10-CM | POA: Diagnosis not present

## 2016-07-30 LAB — POCT WET PREP (WET MOUNT)
CLUE CELLS WET PREP WHIFF POC: NEGATIVE
WBC WET PREP: POSITIVE

## 2016-07-30 MED ORDER — FLUCONAZOLE 150 MG PO TABS: ORAL_TABLET | ORAL | 1 refills | Status: DC

## 2016-07-30 MED ORDER — METRONIDAZOLE 0.75 % VA GEL: 1.0000 | Freq: Every day | VAGINAL | 0 refills | Status: DC

## 2016-07-30 NOTE — Progress Notes (Signed)
Subjective:     Patient ID: Tracy [name], female   DOB: [birth year], 31 y.o.   MRN: [id number]  HPI Tracy [name] is a 31 year old white female, divorced in complaining of vaginal discharge with occasional odor, used diflucan, and it did not help with discharge.   Review of Systems Vagina discharge Occasional vaginal odor Reviewed past medical,surgical, social and family history. Reviewed medications and allergies.     Objective:   Physical Exam BP 116/64 (BP Location: Right Arm, Patient Position: Sitting, Cuff Size: Normal)   Pulse 85   Ht 5\' 2"  (1.575 m)   Wt 151 lb (68.5 kg)   LMP  (LMP Unknown)   BMI 27.62 kg/m    Skin warm and dry.Pelvic: external genitalia is normal in appearance no lesions, vagina: white discharge without odor,urethra has no lesions or masses noted, cervix:smooth, uterus: normal size, shape and contour, non tender, no masses felt, adnexa: no masses or tenderness noted. Bladder is non tender and no masses felt. Wet prep: + for clue cells and +WBCs.  Assessment:     1. Vaginal discharge   2. BV (bacterial vaginosis)   3. Vaginal odor       Plan:     Meds ordered this encounter  Medications  . metroNIDAZOLE (METROGEL VAGINAL) 0.75 % vaginal gel    Sig: Place 1 Applicatorful vaginally at bedtime.    Dispense:  70 g    Refill:  0    Order Specific Question:   Supervising Provider    Answer:   EURE, LUTHER H [[id number]]  . fluconazole (DIFLUCAN) 150 MG tablet    Sig: Take 1 now and repeat 1 in 3 days of needed    Dispense:  2 tablet    Refill:  1    Order Specific Question:   Supervising Provider    Answer:   Tania Ade H [[id number]]  Follow up prn

## 2016-08-20 ENCOUNTER — Telehealth: Payer: Self-pay | Admitting: *Deleted

## 2016-08-20 MED ORDER — METRONIDAZOLE 500 MG PO TABS: 500.0000 mg | ORAL_TABLET | Freq: Three times a day (TID) | ORAL | 0 refills | Status: DC

## 2016-08-20 NOTE — Telephone Encounter (Signed)
Pt says BV persists after gel ,w ill rx flagyl 5000 mg 1 tid x 7 days, no alcohol

## 2016-08-21 ENCOUNTER — Ambulatory Visit: Payer: Medicare Other

## 2016-08-30 ENCOUNTER — Ambulatory Visit (INDEPENDENT_AMBULATORY_CARE_PROVIDER_SITE_OTHER): Payer: BLUE CROSS/BLUE SHIELD | Admitting: Adult Health

## 2016-08-30 ENCOUNTER — Encounter: Payer: Self-pay | Admitting: Adult Health

## 2016-08-30 ENCOUNTER — Other Ambulatory Visit (HOSPITAL_COMMUNITY)
Admission: RE | Admit: 2016-08-30 | Discharge: 2016-08-30 | Disposition: A | Discharge status: Home / Self Care | Payer: BLUE CROSS/BLUE SHIELD | Source: Ambulatory Visit | Attending: Adult Health | Admitting: Adult Health

## 2016-08-30 VITALS — BP 120/60 | HR 80 | Ht 63.0 in | Wt 142.0 lb

## 2016-08-30 DIAGNOSIS — Z01419 Encounter for gynecological examination (general) (routine) without abnormal findings: Secondary | ICD-10-CM

## 2016-08-30 DIAGNOSIS — Z3202 Encounter for pregnancy test, result negative: Secondary | ICD-10-CM | POA: Diagnosis not present

## 2016-08-30 DIAGNOSIS — R87610 Atypical squamous cells of undetermined significance on cytologic smear of cervix (ASC-US): Secondary | ICD-10-CM | POA: Diagnosis not present

## 2016-08-30 DIAGNOSIS — R87810 Cervical high risk human papillomavirus (HPV) DNA test positive: Secondary | ICD-10-CM | POA: Diagnosis not present

## 2016-08-30 DIAGNOSIS — F419 Anxiety disorder, unspecified: Secondary | ICD-10-CM

## 2016-08-30 DIAGNOSIS — Z3042 Encounter for surveillance of injectable contraceptive: Secondary | ICD-10-CM

## 2016-08-30 LAB — POCT URINE PREGNANCY: PREG TEST UR: NEGATIVE

## 2016-08-30 MED ORDER — MEDROXYPROGESTERONE ACETATE 150 MG/ML IM SUSP: 150.0000 mg | INTRAMUSCULAR | 4 refills | Status: DC

## 2016-08-30 MED ORDER — MEDROXYPROGESTERONE ACETATE 150 MG/ML IM SUSP
150.0000 mg | Freq: Once | INTRAMUSCULAR | Status: AC
  Administered 2016-08-30: 150 mg via INTRAMUSCULAR

## 2016-08-30 NOTE — Progress Notes (Signed)
Patient ID: Tracy [name], female   DOB: Mar 05, 1985, 31 y.o.   MRN: [id number] History of Present Illness: Tracy [name] is a 31 year old white female, divorced in for well woman gyn exam and pap.She is happy with depo and shot due today.She is sp kidney and pancreas transplant 05/15/15, and is doing well. PCP is [facility].   Current Medications, Allergies, Past Medical History, Past Surgical History, Family History and Social History were reviewed in [employer] record.     Review of Systems: Patient denies any headaches, hearing loss, fatigue, blurred vision, shortness of breath, chest pain, abdominal pain, problems with bowel movements, urination, or intercourse. No joint pain or mood swings.Not sleeping well, jsut started new med, not sure of name,from PCP    Physical Exam:BP 120/60 (BP Location: Right Arm, Patient Position: Sitting, Cuff Size: Small)   Pulse 80   Ht 5\' 3"  (1.6 m)   Wt 142 lb (64.4 kg)   BMI 25.15 kg/m UPT negative.  General:  Well developed, well nourished, no acute distress Skin:  Warm and dry Neck:  Midline trachea, normal thyroid, good ROM, no lymphadenopathy Lungs; Clear to auscultation bilaterally Breast:  No dominant palpable mass, retraction, or nipple discharge Cardiovascular: Regular rate and rhythm Abdomen:  Soft, non tender, no hepatosplenomegaly Pelvic:  External genitalia is normal in appearance, no lesions.  The vagina is normal in appearance. Urethra has no lesions or masses. The cervix is smooth, pap with HPV performed.  Uterus is felt to be normal size, shape, and contour.  No adnexal masses or tenderness noted.Bladder is non tender, no masses felt. Extremities/musculoskeletal:  No swelling or varicosities noted, no clubbing or cyanosis   Psych:  No mood changes, alert and cooperative,seems happy PHQ 9 score 9, is on meds, and denies being suicidal.   Impression: 1. Encounter for routine gynecological examination with  Papanicolaou smear of cervix   2. Encounter for surveillance of injectable contraceptive   3. Pregnancy examination or test, negative result   4. Anxiety       Plan: Pap with HPV done Labs with PCP Meds ordered this encounter  Medications  . medroxyPROGESTERone (DEPO-PROVERA) 150 MG/ML injection    Sig: Inject 1 mL (150 mg total) into the muscle every 3 (three) months.    Dispense:  1 mL    Refill:  4    Order Specific Question:   Supervising Provider    Answer:   Florian Buff [[id number]]  Physical in 1 year, pap in 3 if normal Depo in 12 weeks

## 2016-08-30 NOTE — Addendum Note (Signed)
Addended by: Diona Fanti A on: 08/30/2016 09:28 AM   Modules accepted: Orders

## 2016-09-04 LAB — CYTOLOGY - PAP
DIAGNOSIS: UNDETERMINED — AB
HPV (WINDOPATH): DETECTED — AB

## 2016-09-05 ENCOUNTER — Telehealth: Payer: Self-pay | Admitting: Adult Health

## 2016-09-05 ENCOUNTER — Encounter: Payer: Self-pay | Admitting: Adult Health

## 2016-09-05 DIAGNOSIS — R87811 Vaginal high risk human papillomavirus (HPV) DNA test positive: Principal | ICD-10-CM

## 2016-09-05 DIAGNOSIS — R87620 Atypical squamous cells of undetermined significance on cytologic smear of vagina (ASC-US): Secondary | ICD-10-CM

## 2016-09-05 HISTORY — DX: Atypical squamous cells of undetermined significance on cytologic smear of vagina (ASC-US): R87.620

## 2016-09-05 NOTE — Telephone Encounter (Signed)
Pt aware of pap, colpo appt made

## 2016-09-05 NOTE — Telephone Encounter (Signed)
Left message to call me about her pap, if she calls she needs colpo

## 2016-09-11 ENCOUNTER — Other Ambulatory Visit: Payer: Self-pay | Admitting: Obstetrics and Gynecology

## 2016-09-11 ENCOUNTER — Ambulatory Visit (INDEPENDENT_AMBULATORY_CARE_PROVIDER_SITE_OTHER): Payer: BLUE CROSS/BLUE SHIELD | Admitting: Obstetrics and Gynecology

## 2016-09-11 ENCOUNTER — Encounter: Payer: Self-pay | Admitting: Obstetrics and Gynecology

## 2016-09-11 VITALS — BP 102/58 | HR 85 | Ht 63.0 in | Wt 143.8 lb

## 2016-09-11 DIAGNOSIS — Z3202 Encounter for pregnancy test, result negative: Secondary | ICD-10-CM

## 2016-09-11 DIAGNOSIS — R87620 Atypical squamous cells of undetermined significance on cytologic smear of vagina (ASC-US): Secondary | ICD-10-CM

## 2016-09-11 DIAGNOSIS — R87811 Vaginal high risk human papillomavirus (HPV) DNA test positive: Secondary | ICD-10-CM | POA: Diagnosis not present

## 2016-09-11 LAB — POCT URINE PREGNANCY: Preg Test, Ur: NEGATIVE

## 2016-09-11 NOTE — Progress Notes (Signed)
Patient ID: Tracy [name], female   DOB: 01-04-1986, 31 y.o.   MRN: [id number]  Tracy [name] 31 y.o. [id number] here for colposcopy for ASCUS with POSITIVE high risk HPV pap smear on 08/30/16.  Discussed role for HPV in cervical dysplasia, need for surveillance. She notes that she recently received a kidney and pancreas transplant within the past 1.5 years and been on immunosuppressant drugs since 05/2015. Denies any symptoms at this time. Pt reports that she is divorced at this time and currently in a relationship of 1.5 years. Pt states that she works at [employer]. Discussion: Discussed with pt progression of abnormal pap smear results. Pt advised that low risk +HPV, ASCUS and LSIL typically revert back to normal cells and treatment plan is typically yearly surveillance for 3 years. Pt advised that colposcopy is typically only indicated with ASCUS and LSIL with +HPV. Discussed with pt that high grade CIN II and III require treatment. Pt advised that it typically takes 5 years or more to go from high grade abnormality to cervical cancer.   At end of discussion, pt had opportunity to ask questions and has no further questions at this time.   Specific discussion of HPV as noted above. Greater than 50% was spent in counseling and coordination of care with the patient.   Total time greater than: 25 minutes.    Patient given informed consent, signed copy in the chart, time out was performed.  Placed in lithotomy position. Cervix viewed with speculum and colposcope after application of acetic acid.   Colposcopy adequate? Yes  no visible lesions   ECC specimen obtained. Yes All specimens were labelled and sent to pathology.   Colposcopy IMPRESSION: No visible lesions Plan: follow up in 1 year for pap or PRN.  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  Annual followup with pap and HPV cotesting is recommended as per ASCCP guidelines. Patient was given  post procedure instructions. Will follow up pathology and manage accordingly. Routine preventative health maintenance measures emphasized.  By signing my name below, I, Margit Banda, attest that this documentation has been prepared under the direction and in the presence of Jonnie Kind, MD. Electronically Signed: Margit Banda, Medical Scribe. 09/11/16. 11:15 AM.  I personally performed the services described in this documentation, which was SCRIBED in my presence. The recorded information has been reviewed and considered accurate. It has been edited as necessary during review. Jonnie Kind, MD

## 2016-09-16 ENCOUNTER — Encounter: Payer: Medicare Other | Admitting: Obstetrics & Gynecology

## 2016-09-17 ENCOUNTER — Telehealth: Payer: Self-pay | Admitting: *Deleted

## 2016-09-17 NOTE — Telephone Encounter (Signed)
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  Annual followup with pap and HPV cotesting is recommended as per ASCCP guidelines.  Pt has been given 1 yr followup appt. Patient verbalized understanding.

## 2016-11-09 ENCOUNTER — Other Ambulatory Visit: Payer: Self-pay | Admitting: Adult Health

## 2016-11-09 DIAGNOSIS — Z3042 Encounter for surveillance of injectable contraceptive: Secondary | ICD-10-CM

## 2016-11-14 ENCOUNTER — Telehealth (HOSPITAL_COMMUNITY): Payer: Self-pay | Admitting: *Deleted

## 2016-11-14 NOTE — Telephone Encounter (Signed)
left voice message regarding an appointment. 

## 2016-11-22 ENCOUNTER — Encounter: Payer: Self-pay | Admitting: *Deleted

## 2016-11-22 ENCOUNTER — Ambulatory Visit (INDEPENDENT_AMBULATORY_CARE_PROVIDER_SITE_OTHER): Payer: BLUE CROSS/BLUE SHIELD | Admitting: *Deleted

## 2016-11-22 ENCOUNTER — Encounter: Payer: Self-pay | Admitting: Obstetrics & Gynecology

## 2016-11-22 VITALS — Wt 148.0 lb

## 2016-11-22 DIAGNOSIS — Z3202 Encounter for pregnancy test, result negative: Secondary | ICD-10-CM

## 2016-11-22 DIAGNOSIS — Z3042 Encounter for surveillance of injectable contraceptive: Secondary | ICD-10-CM | POA: Diagnosis not present

## 2016-11-22 LAB — POCT URINE PREGNANCY: PREG TEST UR: NEGATIVE

## 2016-11-22 MED ORDER — MEDROXYPROGESTERONE ACETATE 150 MG/ML IM SUSP
150.0000 mg | Freq: Once | INTRAMUSCULAR | Status: AC
  Administered 2016-11-22: 150 mg via INTRAMUSCULAR

## 2016-11-22 NOTE — Progress Notes (Signed)
Depo Provera 150mg IM given in right deltoid with no complications. Pt to return in 12 weeks for next injection.  

## 2016-11-27 ENCOUNTER — Telehealth: Payer: Self-pay | Admitting: Obstetrics & Gynecology

## 2016-11-28 ENCOUNTER — Encounter: Payer: Self-pay | Admitting: *Deleted

## 2016-12-02 ENCOUNTER — Other Ambulatory Visit: Payer: Self-pay | Admitting: Obstetrics & Gynecology

## 2016-12-03 ENCOUNTER — Ambulatory Visit (INDEPENDENT_AMBULATORY_CARE_PROVIDER_SITE_OTHER): Payer: BLUE CROSS/BLUE SHIELD | Admitting: Adult Health

## 2016-12-03 ENCOUNTER — Encounter: Payer: Self-pay | Admitting: Adult Health

## 2016-12-03 VITALS — BP 116/60 | HR 92 | Ht 64.5 in | Wt 147.0 lb

## 2016-12-03 DIAGNOSIS — N764 Abscess of vulva: Secondary | ICD-10-CM | POA: Diagnosis not present

## 2016-12-03 MED ORDER — DOXYCYCLINE HYCLATE 100 MG PO TABS: 100.0000 mg | ORAL_TABLET | Freq: Two times a day (BID) | ORAL | 0 refills | Status: DC

## 2016-12-03 NOTE — Progress Notes (Signed)
Subjective:     Patient ID: Tracy [name], female   DOB: Aug 16, 1985, 31 y.o.   MRN: [id number]  HPI Tracy [name] is a 31 year old white female, in complaining of area right labia, that is red and swollen.She shaved Saturday and rode horses Sunday and has discomfort and irritation since. She tried to pop it and could not.   Review of Systems Right labia red and swollen Reviewed past medical,surgical, social and family history. Reviewed medications and allergies.     Objective:   Physical Exam BP 116/60 (BP Location: Right Arm, Patient Position: Sitting, Cuff Size: Normal)   Pulse 92   Ht 5' 4.5" (1.638 m)   Wt 147 lb (66.7 kg)   BMI 24.84 kg/m   Skin warm and dry, right labia is swollen and red and tender, no drainage.  Assessment:     1. Vulvar abscess       Plan:     Rx doxycycline 100 mg 1 bid x 14 days #28  Use warm compresses F/U 11/1 at 8:30 am Do not squeeze

## 2016-12-03 NOTE — Patient Instructions (Signed)
Use warm compresses Do not squeeze

## 2016-12-04 ENCOUNTER — Telehealth: Payer: Self-pay | Admitting: Adult Health

## 2016-12-04 ENCOUNTER — Other Ambulatory Visit: Payer: Self-pay | Admitting: Advanced Practice Midwife

## 2016-12-04 MED ORDER — TRAMADOL HCL 50 MG PO TABS: 50.0000 mg | ORAL_TABLET | Freq: Four times a day (QID) | ORAL | 0 refills | Status: DC | PRN

## 2016-12-04 NOTE — Telephone Encounter (Signed)
Tramadol sent. 

## 2016-12-04 NOTE — Progress Notes (Signed)
TRAMADOL for vulvar abscess pain

## 2016-12-04 NOTE — Telephone Encounter (Signed)
Pt aware Tramadol faxed to pharmacy. [place]

## 2016-12-04 NOTE — Telephone Encounter (Signed)
Spoke with pt. Pt saw Jenn yesterday and has a vulvar abscess. She is having a lot of pain and Tylenol is not helping. Can you order something for pain? Thanks!! [place]

## 2016-12-05 ENCOUNTER — Telehealth: Payer: Self-pay | Admitting: Advanced Practice Midwife

## 2016-12-05 NOTE — Telephone Encounter (Signed)
Patient called stating that she has called her pharmacy [facility] in [place] to see if her medication has ben filled and they told her they never received it. Pt also states that she would like for the Doctor to write a letter to excuse her from work today because her cyst has gotten bigger and she can not wear underwear or pants. Please contact pt

## 2016-12-05 NOTE — Telephone Encounter (Signed)
LMOVM that prescription was called in to [facility] in [place]. Also needed more information for note.

## 2016-12-09 ENCOUNTER — Telehealth: Payer: Self-pay | Admitting: *Deleted

## 2016-12-09 MED ORDER — FLUCONAZOLE 150 MG PO TABS: ORAL_TABLET | ORAL | 1 refills | Status: DC

## 2016-12-09 NOTE — Telephone Encounter (Signed)
Has yeast now, from antibiotic and abscess is draining, has appt Thursday, rx diflucan

## 2016-12-12 ENCOUNTER — Encounter: Payer: Self-pay | Admitting: Adult Health

## 2016-12-12 ENCOUNTER — Ambulatory Visit (INDEPENDENT_AMBULATORY_CARE_PROVIDER_SITE_OTHER): Payer: BLUE CROSS/BLUE SHIELD | Admitting: Adult Health

## 2016-12-12 VITALS — BP 110/70 | HR 88 | Ht 64.0 in | Wt 145.0 lb

## 2016-12-12 DIAGNOSIS — Z87412 Personal history of vulvar dysplasia: Secondary | ICD-10-CM

## 2016-12-12 DIAGNOSIS — N764 Abscess of vulva: Secondary | ICD-10-CM

## 2016-12-12 DIAGNOSIS — Z09 Encounter for follow-up examination after completed treatment for conditions other than malignant neoplasm: Secondary | ICD-10-CM | POA: Diagnosis not present

## 2016-12-12 NOTE — Progress Notes (Signed)
Subjective:     Patient ID: Tracy [name], female   DOB: [birth year], 31 y.o.   MRN: [id number]  HPI Tracy [name] is a 31 year old white female in for follow up on vulva abscess, it drained and feels much better.  Review of Systems Feels much better  Reviewed past medical,surgical, social and family history. Reviewed medications and allergies.     Objective:   Physical Exam BP 110/70 (BP Location: Right Arm, Patient Position: Sitting, Cuff Size: Normal)   Pulse 88   Ht 5\' 4"  (1.626 m)   Wt 145 lb (65.8 kg)   BMI 24.89 kg/m    Skin warm and dry, abscess on right labia has resolved, no redness, only slight thickness remains, non tender.   Assessment:     Vulva abscess, resolved     Plan:     Finish meds Follow up prn

## 2016-12-12 NOTE — Patient Instructions (Signed)
Finish meds  F/U prn

## 2017-01-14 ENCOUNTER — Telehealth: Payer: Self-pay | Admitting: *Deleted

## 2017-01-15 MED ORDER — DOXYCYCLINE HYCLATE 100 MG PO TABS: 100.0000 mg | ORAL_TABLET | Freq: Two times a day (BID) | ORAL | 1 refills | Status: DC

## 2017-01-15 NOTE — Telephone Encounter (Signed)
Boil on butt cheek, will rx doxycycline, has started putting meds to draw I out, if not better make appt

## 2017-01-16 ENCOUNTER — Encounter: Payer: Self-pay | Admitting: Adult Health

## 2017-01-16 ENCOUNTER — Ambulatory Visit (INDEPENDENT_AMBULATORY_CARE_PROVIDER_SITE_OTHER): Payer: BLUE CROSS/BLUE SHIELD | Admitting: Adult Health

## 2017-01-16 VITALS — BP 120/80 | HR 96 | Ht 63.0 in | Wt 148.0 lb

## 2017-01-16 DIAGNOSIS — L0231 Cutaneous abscess of buttock: Secondary | ICD-10-CM | POA: Diagnosis not present

## 2017-01-16 NOTE — Progress Notes (Signed)
Subjective:     Patient ID: Tracy [name], female   DOB: 05-May-1985, 31 y.o.   MRN: [id number]  HPI Tracy [name] is a 31 year old white female in complaining of boil right buttock, started doxycycline last night.   Review of Systems +boil right buttock Reviewed past medical,surgical, social and family history. Reviewed medications and allergies.     Objective:   Physical Exam BP 120/80 (BP Location: Right Arm, Patient Position: Sitting, Cuff Size: Small)   Pulse 96   Ht 5\' 3"  (1.6 m)   Wt 148 lb (67.1 kg)   BMI 26.22 kg/m   PHQ 9 score 5, is on meds. Skin warm and dry, has 4 cm round tender area right buttock at crease of leg, Dr Glo Herring in for co exam and he decided to I&D the abscess, cleaned with betadine, and injected with 10 cc 2% lidocaine and then used # 11 blade to drain abscess, which expelled about about 15-20 cc bloody purulent discharge, no odor.Dressed with sterile 4 x 4  and abd pad.    Assessment:     1. Abscess of buttock, right       Plan:     Continue doxycycline Use warm compresses F/U 12/12 at 4:15 pm Note given for having to leave work today

## 2017-01-22 ENCOUNTER — Encounter: Payer: Self-pay | Admitting: Adult Health

## 2017-01-22 ENCOUNTER — Ambulatory Visit (INDEPENDENT_AMBULATORY_CARE_PROVIDER_SITE_OTHER): Payer: BLUE CROSS/BLUE SHIELD | Admitting: Adult Health

## 2017-01-22 VITALS — BP 118/68 | HR 94 | Ht 63.0 in | Wt 151.0 lb

## 2017-01-22 DIAGNOSIS — L0231 Cutaneous abscess of buttock: Secondary | ICD-10-CM

## 2017-01-22 DIAGNOSIS — Z09 Encounter for follow-up examination after completed treatment for conditions other than malignant neoplasm: Secondary | ICD-10-CM

## 2017-01-22 NOTE — Progress Notes (Signed)
Subjective:     Patient ID: Tracy [name], female   DOB: [birth year], 31 y.o.   MRN: [id number]  HPI Tracy [name] is a 31 year old white female, back in follow up on abscess. Has no pain now.   Review of Systems No pain  Reviewed past medical,surgical, social and family history. Reviewed medications and allergies.     Objective:   Physical Exam BP 118/68 (BP Location: Right Arm, Patient Position: Sitting, Cuff Size: Normal)   Pulse 94   Ht 5\' 3"  (1.6 m)   Wt 151 lb (68.5 kg)   BMI 26.75 kg/m    Skin warm and dry, area right buttock, slight pink, has scab, non tender.   Assessment:     Abscess, right buttock, resolved     Plan:     Finish antibiotics Follow up prn

## 2017-02-13 ENCOUNTER — Encounter: Payer: Self-pay | Admitting: Obstetrics & Gynecology

## 2017-02-13 ENCOUNTER — Ambulatory Visit (INDEPENDENT_AMBULATORY_CARE_PROVIDER_SITE_OTHER): Payer: BLUE CROSS/BLUE SHIELD

## 2017-02-13 ENCOUNTER — Ambulatory Visit: Payer: Medicare Other

## 2017-02-13 VITALS — Wt 147.8 lb

## 2017-02-13 DIAGNOSIS — Z3042 Encounter for surveillance of injectable contraceptive: Secondary | ICD-10-CM | POA: Diagnosis not present

## 2017-02-13 DIAGNOSIS — Z3202 Encounter for pregnancy test, result negative: Secondary | ICD-10-CM | POA: Diagnosis not present

## 2017-02-13 LAB — POCT URINE PREGNANCY: PREG TEST UR: NEGATIVE

## 2017-02-13 MED ORDER — MEDROXYPROGESTERONE ACETATE 150 MG/ML IM SUSP
150.0000 mg | Freq: Once | INTRAMUSCULAR | Status: AC
  Administered 2017-02-13: 150 mg via INTRAMUSCULAR

## 2017-02-13 NOTE — Progress Notes (Signed)
Pt here for depo shot 150 mg IM rt deltoid.Tolerated well. Return 12 weeks for next shot.pad CMA

## 2017-03-12 ENCOUNTER — Telehealth: Payer: Self-pay | Admitting: Adult Health

## 2017-03-12 MED ORDER — LORAZEPAM 1 MG PO TABS: 1.0000 mg | ORAL_TABLET | Freq: Every day | ORAL | 0 refills | Status: DC

## 2017-03-12 NOTE — Telephone Encounter (Signed)
Can't sleep, stressed with family seeing BH next week will increase ativan to 1 mg at hs
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
## 2017-03-19 ENCOUNTER — Encounter (HOSPITAL_COMMUNITY): Payer: Self-pay | Admitting: Psychiatry

## 2017-03-19 ENCOUNTER — Encounter (INDEPENDENT_AMBULATORY_CARE_PROVIDER_SITE_OTHER): Payer: Self-pay

## 2017-03-19 ENCOUNTER — Ambulatory Visit (INDEPENDENT_AMBULATORY_CARE_PROVIDER_SITE_OTHER): Payer: BLUE CROSS/BLUE SHIELD | Admitting: Psychiatry

## 2017-03-19 VITALS — BP 119/75 | HR 73 | Ht 63.0 in | Wt 147.0 lb

## 2017-03-19 DIAGNOSIS — Z6379 Other stressful life events affecting family and household: Secondary | ICD-10-CM

## 2017-03-19 DIAGNOSIS — F419 Anxiety disorder, unspecified: Secondary | ICD-10-CM | POA: Diagnosis not present

## 2017-03-19 DIAGNOSIS — R450 Nervousness: Secondary | ICD-10-CM

## 2017-03-19 DIAGNOSIS — F331 Major depressive disorder, recurrent, moderate: Secondary | ICD-10-CM

## 2017-03-19 DIAGNOSIS — G4700 Insomnia, unspecified: Secondary | ICD-10-CM

## 2017-03-19 MED ORDER — TRAZODONE HCL 50 MG PO TABS: ORAL_TABLET | ORAL | 0 refills | Status: DC

## 2017-03-19 MED ORDER — SERTRALINE HCL 50 MG PO TABS: ORAL_TABLET | ORAL | 0 refills | Status: DC

## 2017-03-19 NOTE — Patient Instructions (Signed)
1. Start sertraline 25 mg daily for one week, then 50 mg daily  2. Start Trazodone 25-50 mg at night as needed for sleep 3. Return to clinic in one month for 30 mins 4. Referral to therapy  5. Check with you doctor if you have recently checked for thyroid (TSH)

## 2017-04-13 IMAGING — US US ASPIRATION
1 series · 4 of 4 positions shown · non-contrast
Comparison: none

CLINICAL DATA: Acute infectious myositis

[Series 1: us aspiration · 0.14mm/px · 4 acquisitions, 4 frames shown]
[im 1/4]
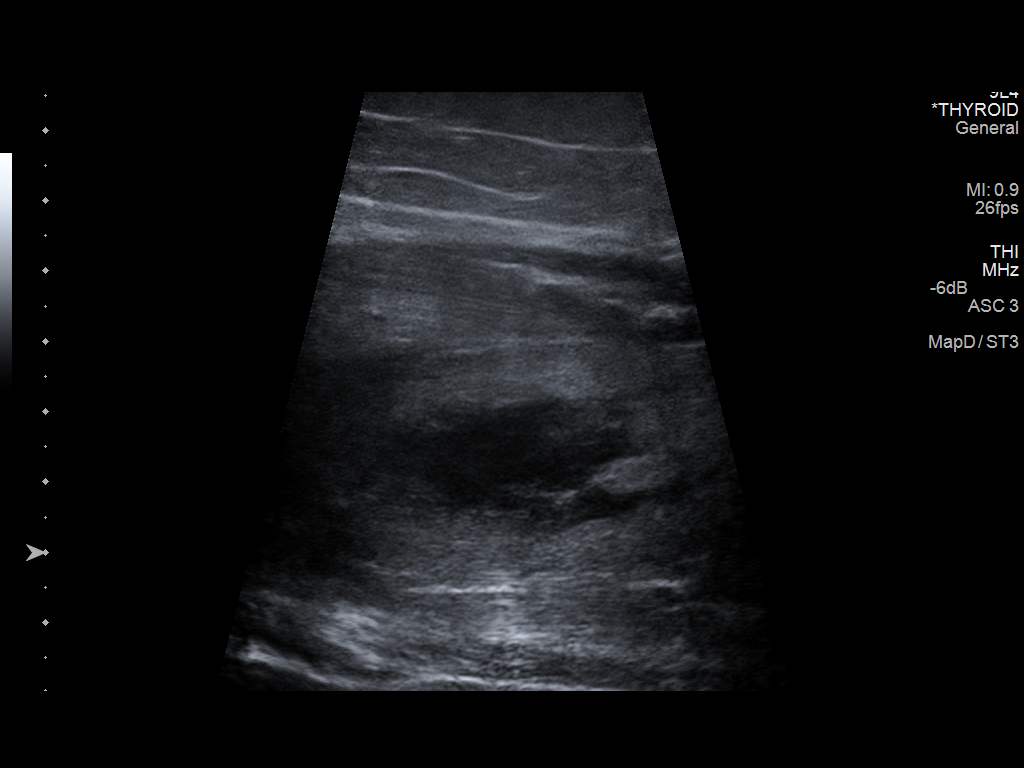
[im 2/4]
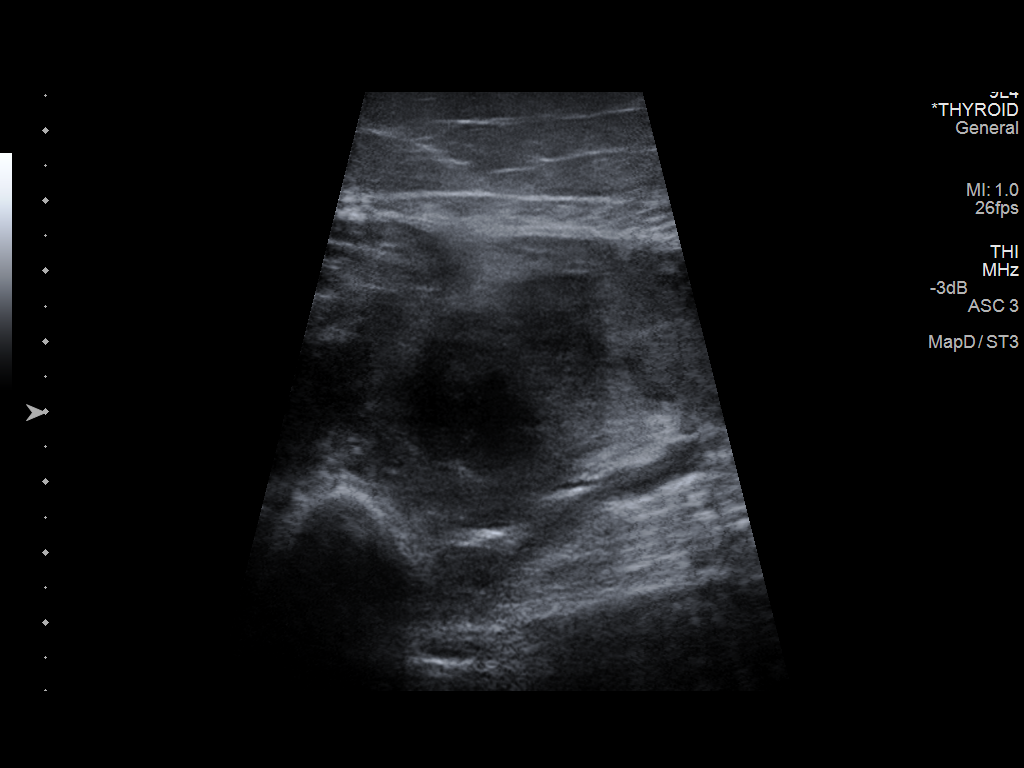
[im 3/4]
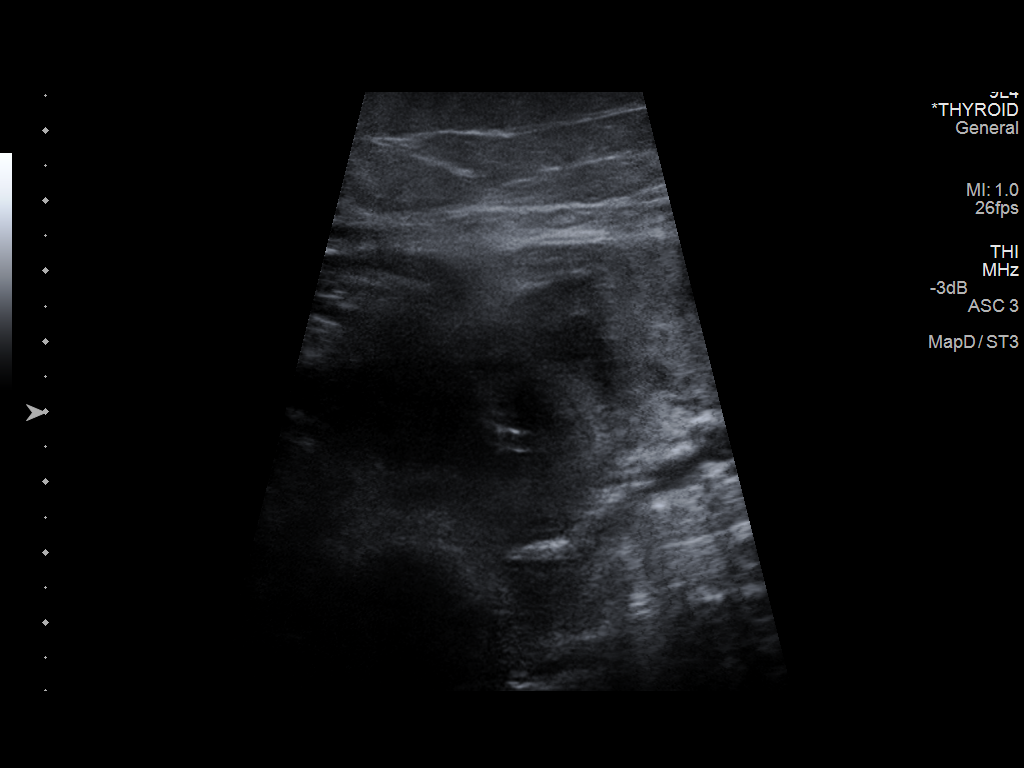
[im 4/4]
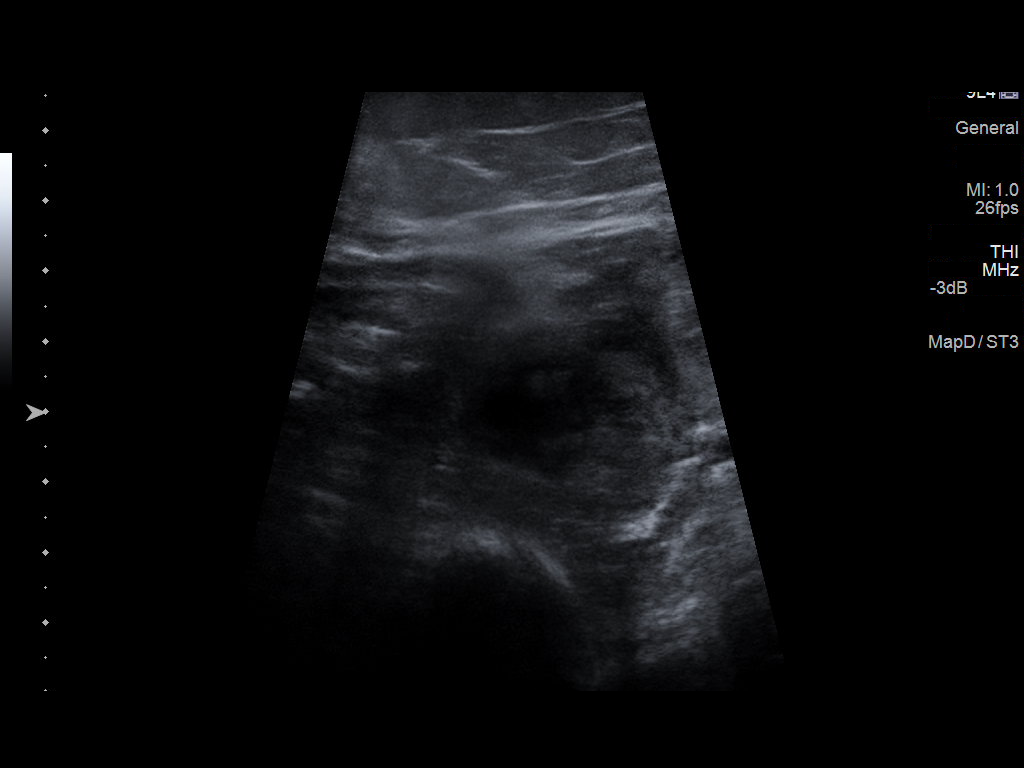

[4 of 4 positions shown; findings below may reference images not displayed]

EXAM:
ULTRASOUND RIGHT THIGH intramuscular ABSCESS NEEDLE ASPIRATION

Date:  [DATE] [DATE]

Radiologist:  Orth, [facility]

Guidance:  Ultrasound

FLUOROSCOPY TIME:  None.

MEDICATIONS AND MEDICAL HISTORY:
1.5 mg Versed, 50 mcg fentanyl

ANESTHESIA/SEDATION:
15 minutes

CONTRAST:  None

COMPLICATIONS:
None

PROCEDURE:
Informed consent was obtained from the patient following explanation
of the procedure, risks, benefits and alternatives. The patient
understands, agrees and consents for the procedure. All questions
were addressed. A time out was performed.

Maximal barrier sterile technique utilized including caps, mask,
sterile gowns, sterile gloves, large sterile drape, hand hygiene,
and ChloraPrep.

Previous MRI was reviewed. Preliminary ultrasound performed of the
right thigh. A heterogeneous complex appearing fluid collection is
identified in the right vastus medialis muscle. This correlates with
the MRI finding.

Under sterile conditions and local anesthesia, an 18 gauge access
needle was advanced percutaneously under direct ultrasound into the
intramuscular complex fluid collection. Syringe aspiration yielded 3
cc purulent fluid compatible with an intramuscular abscess. Sample
sent for Gram stain and culture. Needle removed. No immediate
complication.
IMPRESSION: Successful ultrasound aspiration of a right thigh vastus medialis
intramuscular abscess. Gram stain and culture sent.

## 2017-04-14 NOTE — Progress Notes (Deleted)
BH MD/PA/NP OP Progress Note  04/14/2017 1:08 PM Tracy [name]  MRN:  [id number]  Chief Complaint:  HPI: *** Visit Diagnosis: No diagnosis found.  Past Psychiatric History:  I have reviewed the patient's psychiatry history in detail and updated the patient record. Outpatient: denies Psychiatry admission: denies Previous suicide attempt: denies Past trials of medication: sertraline History of violence:     Past Medical History:  Past Medical History:  Diagnosis Date  . Abscess of right thigh   . Anemia    presently on iron supplement  . Anxiety   . Atypical squamous cell changes of undetermined significance (ASCUS) on vaginal cytology with positive high risk human papilloma virus (HPV) 09/05/2016   Get colpo  . Chronic kidney disease    on dialysis T, Th, Sat  . CMV (cytomegalovirus infection) ([place])   . Depression   . Detached retina   . Diabetes mellitus    insulin pump  Type 1  . History of organ transplantation 08/18/2015  . HSV-1 (herpes simplex virus 1) infection   . HSV-2 (herpes simplex virus 2) infection   . Hypertension   . Hypothyroidism   . Irregular periods 07/05/2014  . Neuropathy    in feet  . Seizures ([place]) 01/2014   ? due to blood sugar  . Thyroid enlargement    "not on medication at this time"  . Urinary tract infection    hx of  . Vaginal discharge 07/05/2014  . Yeast infection    took diflucan saturday     Past Surgical History:  Procedure Laterality Date  . APPLICATION OF A-CELL OF EXTREMITY Right 10/03/2014   Procedure: APPLICATION OF A-CELL OF RIGHT UPPER THIGH WOUND;  Surgeon: Renette Butters, MD;  Location: [place];  Service: Orthopedics;  Laterality: Right;  . APPLICATION OF WOUND VAC Right 10/03/2014   Procedure: APPLICATION OF WOUND VAC RIGHT THIGH;  Surgeon: Renette Butters, MD;  Location: [place];  Service: Orthopedics;  Laterality: Right;  . AV FISTULA PLACEMENT Left 09/01/2014   Procedure: BRACHIOCEPHALIC ARTERIOVENOUS (AV) FISTULA  CREATION;  Surgeon: Conrad Carrsville, MD;  Location: [place];  Service: Vascular;  Laterality: Left;  . CHOLECYSTECTOMY    . COMBINED KIDNEY-PANCREAS TRANSPLANT  05/15/15  . I&D EXTREMITY Right 08/14/2014   Procedure: IRRIGATION AND DEBRIDEMENT EXTREMITY WITH MUSCLE BIOPSY;  Surgeon: Roseanne Kaufman, MD;  Location: [place];  Service: Orthopedics;  Laterality: Right;  . I&D EXTREMITY Right 08/20/2014   Procedure:  I AND D LEG / THIGH ABSCESS AND MANIPULATION OF RIGHT ELBOW.;  Surgeon: Renette Butters, MD;  Location: [place];  Service: Orthopedics;  Laterality: Right;  . I&D EXTREMITY Right 09/30/2014   Procedure: IRRIGATION AND DEBRIDEMENT RIGHT THIGH ABSCESS;  Surgeon: [name], MD;  Location: [place];  Service: Orthopedics;  Laterality: Right;  . I&D EXTREMITY Right 10/03/2014   Procedure: IRRIGATION AND DEBRIDEMENT RIGHT THIGH ABSCESS,WITH WOUND CLOSURE & MANIPULATION OF RIGHT KNEE;  Surgeon: Renette Butters, MD;  Location: [place];  Service: Orthopedics;  Laterality: Right;  . INCISION AND DRAINAGE ABSCESS Right 12/09/2014   Procedure: INCISION AND DRAINAGE ABSCESS;  Surgeon: Renette Butters, MD;  Location: [place];  Service: Orthopedics;  Laterality: Right;  . INSERTION OF DIALYSIS CATHETER N/A 09/01/2014   Procedure: INSERTION OF DIALYSIS CATHETER IN RIGHT INTERNAL JUGUILAR;  Surgeon: Conrad Elon, MD;  Location: [place];  Service: Vascular;  Laterality: N/A;  . KIDNEY TRANSPLANT  05/15/15  . PARS PLANA VITRECTOMY  10/01/2011   Procedure: PARS PLANA VITRECTOMY WITH 25 GAUGE;  Surgeon: Hayden Pedro, MD;  Location: [place];  Service: Ophthalmology;  Laterality: Right;  Repair Complex Traction Retinal Detachment  . PILONIDAL CYST EXCISION    . TRIGGER FINGER RELEASE Right 09/21/13  . WISDOM TOOTH EXTRACTION      Family Psychiatric History: I have reviewed the patient's family history in detail and updated the patient record.  Family History:  Family History  Problem Relation Age of Onset  . Cancer Paternal  Grandfather        prostate  . Hyperlipidemia Paternal Grandfather   . Stroke Paternal Grandfather     Social History:  Social History   Socioeconomic History  . Marital status: Legally Separated    Spouse name: Audelia Acton  . Number of children: 0  . Years of education: College  . Highest education level: Not on file  Social Needs  . Financial resource strain: Not on file  . Food insecurity - worry: Not on file  . Food insecurity - inability: Not on file  . Transportation needs - medical: Not on file  . Transportation needs - non-medical: Not on file  Occupational History    Employer: [employer]  Tobacco Use  . Smoking status: Never Smoker  . Smokeless tobacco: Never Used  Substance and Sexual Activity  . Alcohol use: No    Alcohol/week: 0.0 oz  . Drug use: No  . Sexual activity: Yes    Birth control/protection: Injection  Other Topics Concern  . Not on file  Social History Narrative   Patient lives at home with family.   Caffeine Use: 1 soda daily    Allergies:  Allergies  Allergen Reactions  . Penicillins Hives and Swelling    Has patient had a PCN reaction causing immediate rash, facial/tongue/throat swelling, SOB or lightheadedness with hypotension: Yes Has patient had a PCN reaction causing severe rash involving mucus membranes or skin necrosis: Yes Has patient had a PCN reaction that required hospitalization No Has patient had a PCN reaction occurring within the last 10 years: No If all of the above answers are "NO", then may proceed with Cephalosporin use.   . Sulfa Antibiotics Hives    Metabolic Disorder Labs: Lab Results  Component Value Date   HGBA1C 8.9 (H) 08/10/2014   MPG 209 08/10/2014   MPG 435 (H) 01/20/2013   No results found for: PROLACTIN Lab Results  Component Value Date   TRIG 114 01/20/2014   Lab Results  Component Value Date   TSH 15.859 (H) 08/19/2014   TSH 7.256 (H) 08/17/2014    Therapeutic Level Labs: No results found for:  LITHIUM No results found for: VALPROATE No components found for:  CBMZ  Current Medications: Current Outpatient Medications  Medication Sig Dispense Refill  . acetaminophen (TYLENOL) 500 MG tablet Take 1,000 mg by mouth every 6 (six) hours as needed for mild pain or moderate pain.    Marland Kitchen aspirin EC 81 MG tablet Take 81 mg by mouth daily.     . dapsone 25 MG tablet Take 50 mg by mouth daily.  11  . doxycycline (VIBRA-TABS) 100 MG tablet Take 1 tablet (100 mg total) by mouth 2 (two) times daily. 28 tablet 1  . fluconazole (DIFLUCAN) 150 MG tablet Take 1 now and 1 in 3 days 2 tablet 1  . LORazepam (ATIVAN) 1 MG tablet Take 1 tablet (1 mg total) by mouth at bedtime. 30 tablet 0  . medroxyPROGESTERone (DEPO-PROVERA)  150 MG/ML injection INJECT 1 ML (150 MG TOTAL) INTO THE MUSCLE EVERY 3 (THREE) MONTHS. 1 mL 2  . Multiple Vitamins-Minerals (MULTIVITAMIN WITH MINERALS) tablet Take 1 tablet by mouth daily.     . mycophenolate (MYFORTIC) 180 MG EC tablet Take 180 mg by mouth 2 (two) times daily.     . sertraline (ZOLOFT) 50 MG tablet Start 25 mg daily for one week, then 50 mg daily 30 tablet 0  . tacrolimus (PROGRAF) 1 MG capsule Take 2.5 mg by mouth 2 (two) times daily. 3 tabs BID    . traZODone (DESYREL) 50 MG tablet 25-50 mg at night as needed for sleep 30 tablet 0  . valACYclovir (VALTREX) 1000 MG tablet Take 1 bid x 10 days then can take daily 40 tablet 6   No current facility-administered medications for this visit.      Musculoskeletal: Strength & Muscle Tone: within normal limits Gait & Station: normal Patient leans: N/A  Psychiatric Specialty Exam: ROS  There were no vitals taken for this visit.There is no height or weight on file to calculate BMI.  General Appearance: Fairly Groomed  Eye Contact:  Good  Speech:  Clear and Coherent  Volume:  Normal  Mood:  {BHH MOOD:22306}  Affect:  {Affect (PAA):22687}  Thought Process:  Coherent  Orientation:  Full (Time, Place, and Person)   Thought Content: Logical   Suicidal Thoughts:  {ST/HT (PAA):22692}  Homicidal Thoughts:  {ST/HT (PAA):22692}  Memory:  Immediate;   Good Recent;   Good Remote;   Good  Judgement:  {Judgement (PAA):22694}  Insight:  {Insight (PAA):22695}  Psychomotor Activity:  Normal  Concentration:  Concentration: Good and Attention Span: Good  Recall:  Good  Fund of Knowledge: Good  Language: Good  Akathisia:  No  Handed:  Right  AIMS (if indicated): not done  Assets:  Communication Skills Desire for Improvement  ADL's:  Intact  Cognition: WNL  Sleep:  {BHH GOOD/FAIR/POOR:22877}   Screenings: PHQ2-9     Office Visit from 01/16/2017 in [place] Office Visit from 08/30/2016 in [place] Office Visit from 01/18/2016 in [place] Office Visit from 12/19/2014 in [facility] for [facility]ious Disease Office Visit from 09/22/2014 in [facility] for Infectious Disease  PHQ-2 Total Score  2  3  2  1  3   PHQ-9 Total Score  5  9  8   No data  6       Assessment and Plan:  Tracy [name] is a 32 y.o. year old female with a history of depression, anxiety,SPK 05-Jun-2015 from a deceased unrelated donor. Underlying DM1 s/p HD 7/16, on immunosuppression, hypothyroidism, , who presents for follow up appointment for No diagnosis found.  # MDD, moderate, recurrent without psychotic features  Patient reports worsening neurovegetative symptoms in the setting of discordance with her fianc, and history of kidney transplant.  Will restart sertraline to target depression given patient does not recall the reason it was discontinued.  Will start trazodone as needed for insomnia.  Will hold Ativan at this time given patient reports limited benefit from it.  She will greatly benefit from CBT; will make a referral.  Patient is advised to check thyroid given her history of hypothyroidism in the past.   Plan 1. Start sertraline 25 mg daily for one week, then 50 mg daily   2. Start Trazodone 25-50 mg at night as needed for sleep 3. Hold ativan 3. Return to clinic in  one month for 30 mins 4. Referral to therapy  5. Check with your doctor if you have recently checked for thyroid (TSH)  The patient demonstrates the following risk factors for suicide: Chronic risk factors for suicide include: psychiatric disorder of depression. Acute risk factors for suicide include: family or marital conflict. Protective factors for this patient include: positive social support, coping skills and hope for the future. Considering these factors, the overall suicide risk at this point appears to be low. Patient is appropriate for outpatient follow up.    Norman Clay, MD 04/14/2017, 1:08 PM

## 2017-04-16 ENCOUNTER — Other Ambulatory Visit (HOSPITAL_COMMUNITY): Payer: Self-pay | Admitting: Psychiatry

## 2017-04-16 MED ORDER — TRAZODONE HCL 50 MG PO TABS: ORAL_TABLET | ORAL | 0 refills | Status: DC

## 2017-04-17 ENCOUNTER — Ambulatory Visit (HOSPITAL_COMMUNITY): Payer: Self-pay | Admitting: Psychiatry

## 2017-04-21 ENCOUNTER — Ambulatory Visit (HOSPITAL_COMMUNITY): Payer: Self-pay | Admitting: Psychiatry

## 2017-04-22 ENCOUNTER — Other Ambulatory Visit: Payer: Self-pay | Admitting: Adult Health

## 2017-04-22 ENCOUNTER — Other Ambulatory Visit (HOSPITAL_COMMUNITY): Payer: Self-pay | Admitting: Psychiatry

## 2017-04-22 MED ORDER — SERTRALINE HCL 50 MG PO TABS: 50.0000 mg | ORAL_TABLET | Freq: Every day | ORAL | 0 refills | Status: DC

## 2017-04-24 ENCOUNTER — Ambulatory Visit (INDEPENDENT_AMBULATORY_CARE_PROVIDER_SITE_OTHER): Payer: Medicare Other | Admitting: *Deleted

## 2017-04-24 DIAGNOSIS — I781 Nevus, non-neoplastic: Secondary | ICD-10-CM

## 2017-04-24 NOTE — Progress Notes (Signed)
   Cutaneous Laser:pulsed mode  810j/cm2 400 ms delay  13 ms Duration 0.5 spot  Total pulses: 501 Total energy 793  Total time::07  Going to the beach tomorrow so we only did CL today around her scar and a few places at the inner knee. Jumped a bit. Will do sclero in May. Follow prn.

## 2017-05-05 ENCOUNTER — Ambulatory Visit (INDEPENDENT_AMBULATORY_CARE_PROVIDER_SITE_OTHER): Payer: BLUE CROSS/BLUE SHIELD

## 2017-05-05 VITALS — Wt 149.4 lb

## 2017-05-05 DIAGNOSIS — Z3042 Encounter for surveillance of injectable contraceptive: Secondary | ICD-10-CM

## 2017-05-05 DIAGNOSIS — Z3202 Encounter for pregnancy test, result negative: Secondary | ICD-10-CM

## 2017-05-05 LAB — POCT URINE PREGNANCY: Preg Test, Ur: NEGATIVE

## 2017-05-05 MED ORDER — MEDROXYPROGESTERONE ACETATE 150 MG/ML IM SUSP
150.0000 mg | Freq: Once | INTRAMUSCULAR | Status: AC
  Administered 2017-05-05: 150 mg via INTRAMUSCULAR

## 2017-05-05 NOTE — Progress Notes (Signed)
Pt here for depo injection 150 mg IM given rt deltoid. Tolerated well. Return 12 weeks for next injection. Pad CMA 

## 2017-05-08 ENCOUNTER — Ambulatory Visit: Payer: BLUE CROSS/BLUE SHIELD

## 2017-05-21 ENCOUNTER — Other Ambulatory Visit: Payer: Self-pay | Admitting: Adult Health

## 2017-05-21 ENCOUNTER — Ambulatory Visit: Payer: Self-pay

## 2017-05-27 ENCOUNTER — Other Ambulatory Visit (HOSPITAL_COMMUNITY): Payer: Self-pay | Admitting: Psychiatry

## 2017-05-27 MED ORDER — TRAZODONE HCL 50 MG PO TABS: ORAL_TABLET | ORAL | 0 refills | Status: DC

## 2017-06-06 ENCOUNTER — Other Ambulatory Visit: Payer: Self-pay | Admitting: Adult Health

## 2017-06-10 ENCOUNTER — Other Ambulatory Visit: Payer: Self-pay | Admitting: Adult Health

## 2017-06-11 ENCOUNTER — Ambulatory Visit (INDEPENDENT_AMBULATORY_CARE_PROVIDER_SITE_OTHER): Payer: Medicare Other | Admitting: *Deleted

## 2017-06-11 ENCOUNTER — Other Ambulatory Visit: Payer: Self-pay | Admitting: Adult Health

## 2017-06-11 DIAGNOSIS — I781 Nevus, non-neoplastic: Secondary | ICD-10-CM

## 2017-06-11 MED ORDER — FLUCONAZOLE 150 MG PO TABS: ORAL_TABLET | ORAL | 1 refills | Status: DC

## 2017-06-11 NOTE — Progress Notes (Signed)
Refilled diflucan

## 2017-06-11 NOTE — Progress Notes (Signed)
X=.3% Sotradecol administered with a 27g butterfly.  Patient received a total of 6cc.  Cutaneous Laser:pulsed mode  810j/cm2 400 ms delay  13 ms Duration 0.5 spot  Total pulses: 402 Total energy 637  Total time::05  Photos: Yes.    Compression stockings applied: Yes.     Treated all areas of concern. A little jumpy but ok to tx. Used a combo of sclero and CL. Antic good results. Follow prn.

## 2017-06-14 IMAGING — DX DG CHEST 2V
2 series · 2 of 2 positions shown · non-contrast
Comparison: 01/21/2014

CLINICAL DATA: Cough and hypoxia for 3 days. Acute renal failure
superimposed on chronic kidney disease.

EXAM:
CHEST  2 VIEW

[chest lat]
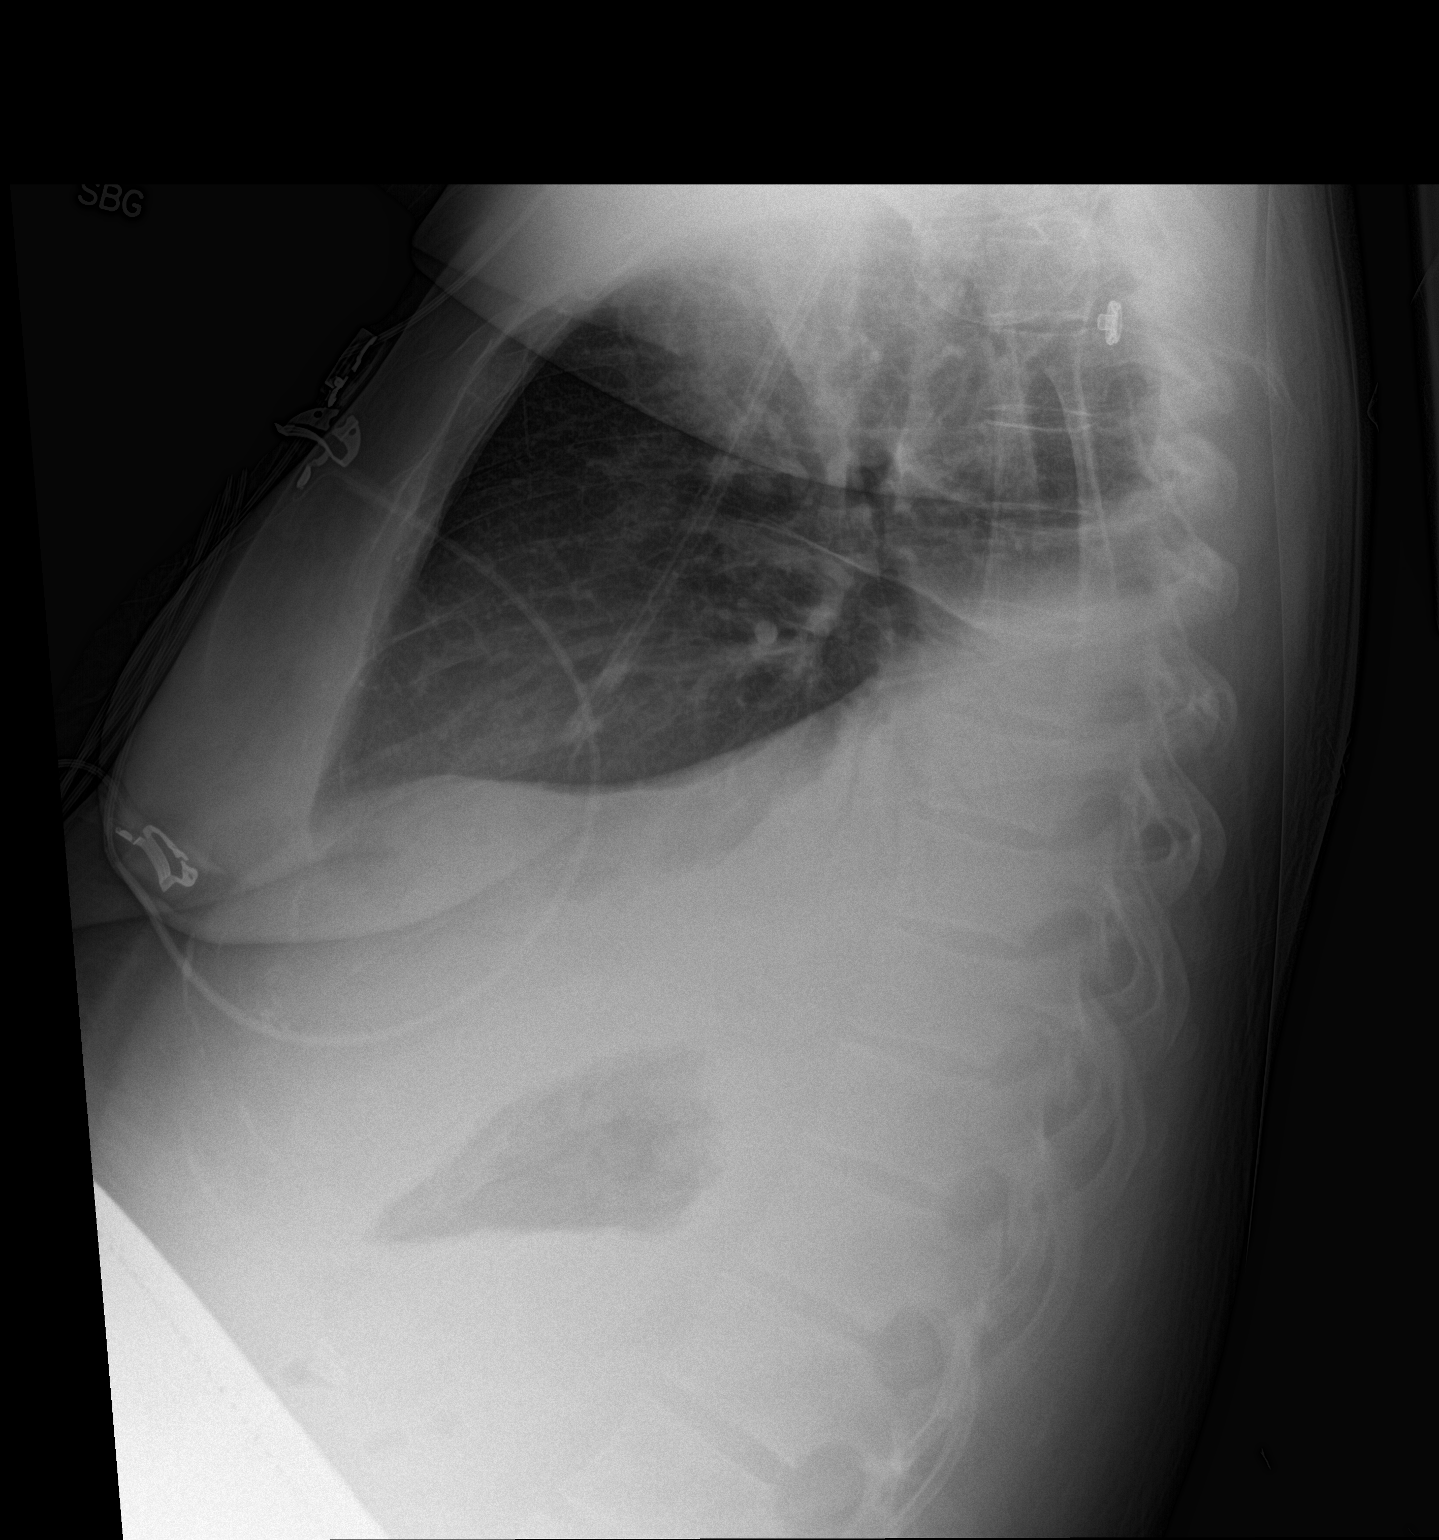

[chest ap]
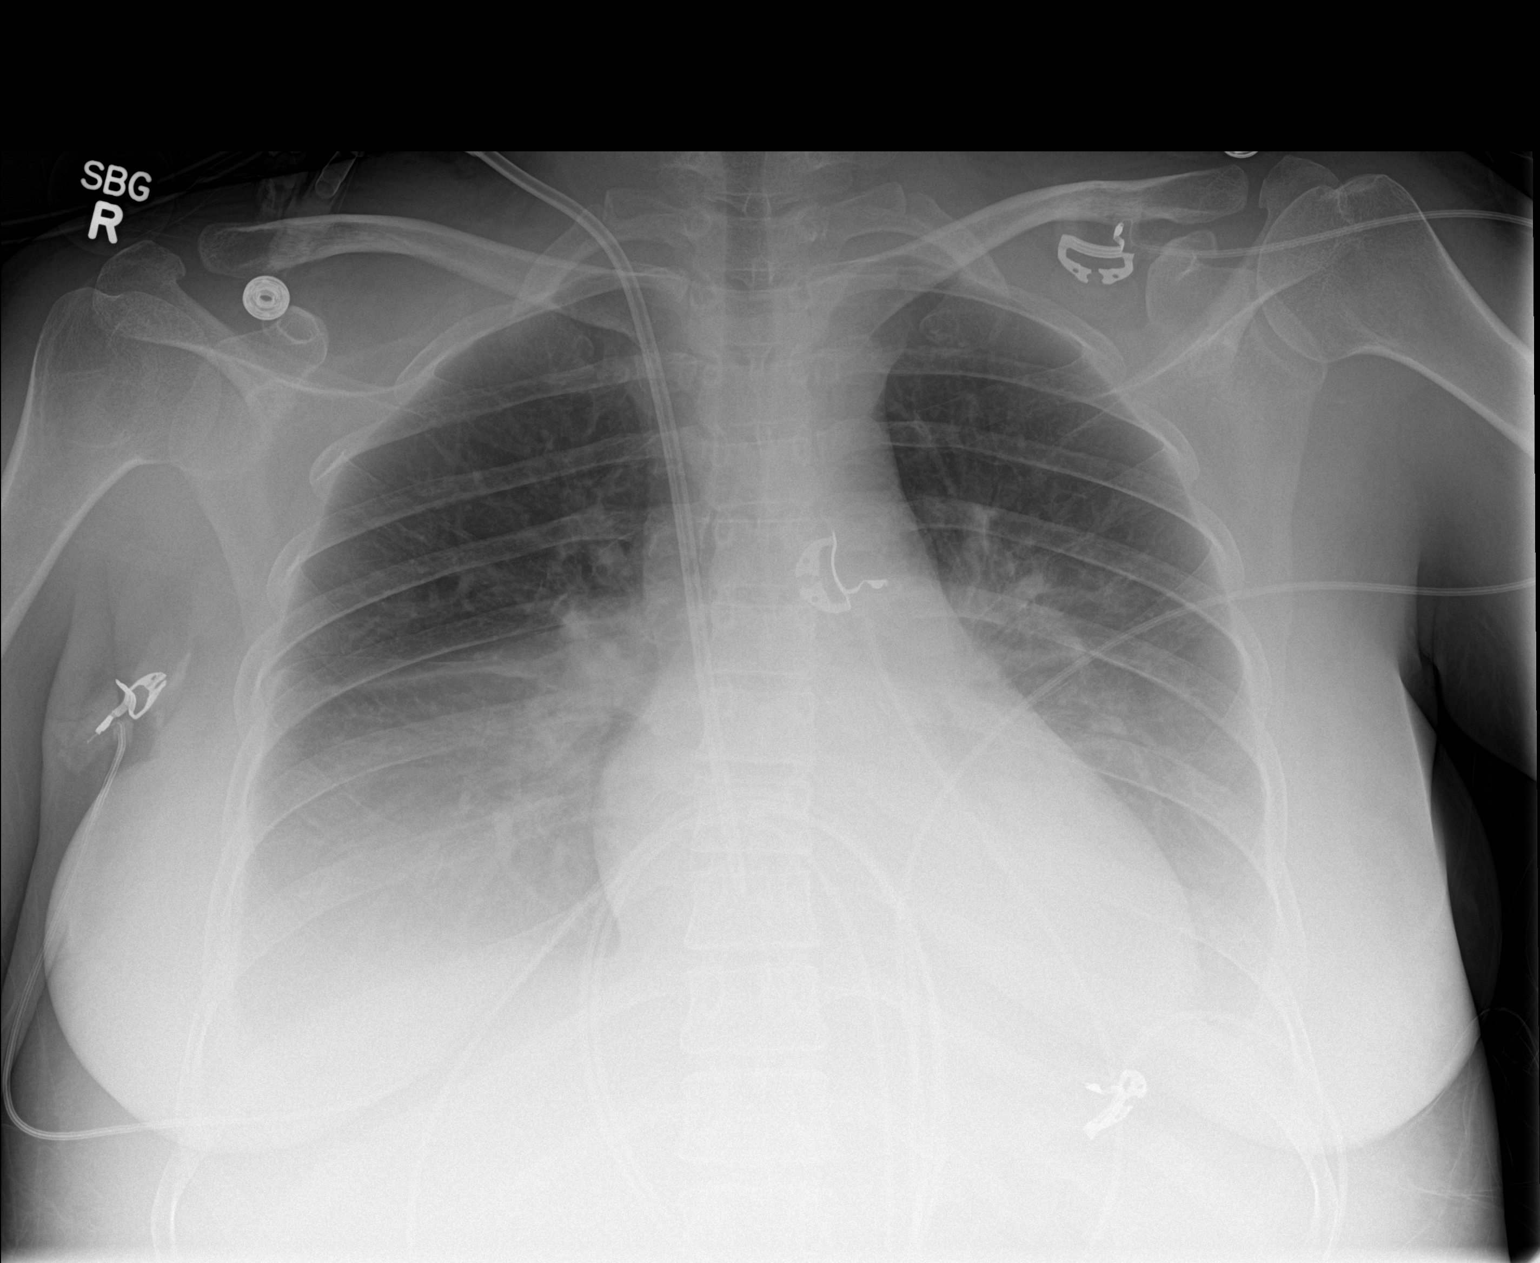

[2 of 2 positions shown; findings below may reference images not displayed]

FINDINGS: Moderate bilateral pleural effusions and bilateral lower lobe
atelectasis or infiltrates are new since previous study. Heart size
is normal.

New right jugular central venous catheter is seen with tip overlying
the mid right atrium. No evidence of pneumothorax.
IMPRESSION: New moderate bilateral pleural effusions and bilateral lower lobe
atelectasis versus infiltrates.

New right jugular central venous catheter tip overlies the right
atrium. No pneumothorax visualized.

## 2017-06-18 IMAGING — CR DG CHEST 2V
2 series · 2 of 2 positions shown · non-contrast
Comparison: 08/21/2014

CLINICAL DATA: CHEST PAIN AND BACK PAIN WHEN SHE COUGHS. NO FEVER X
24 HRS. NO N/V. NON SMOKER. HTN AND BODEWIG. PNA.

EXAM:
CHEST  2 VIEW

[chest lat]
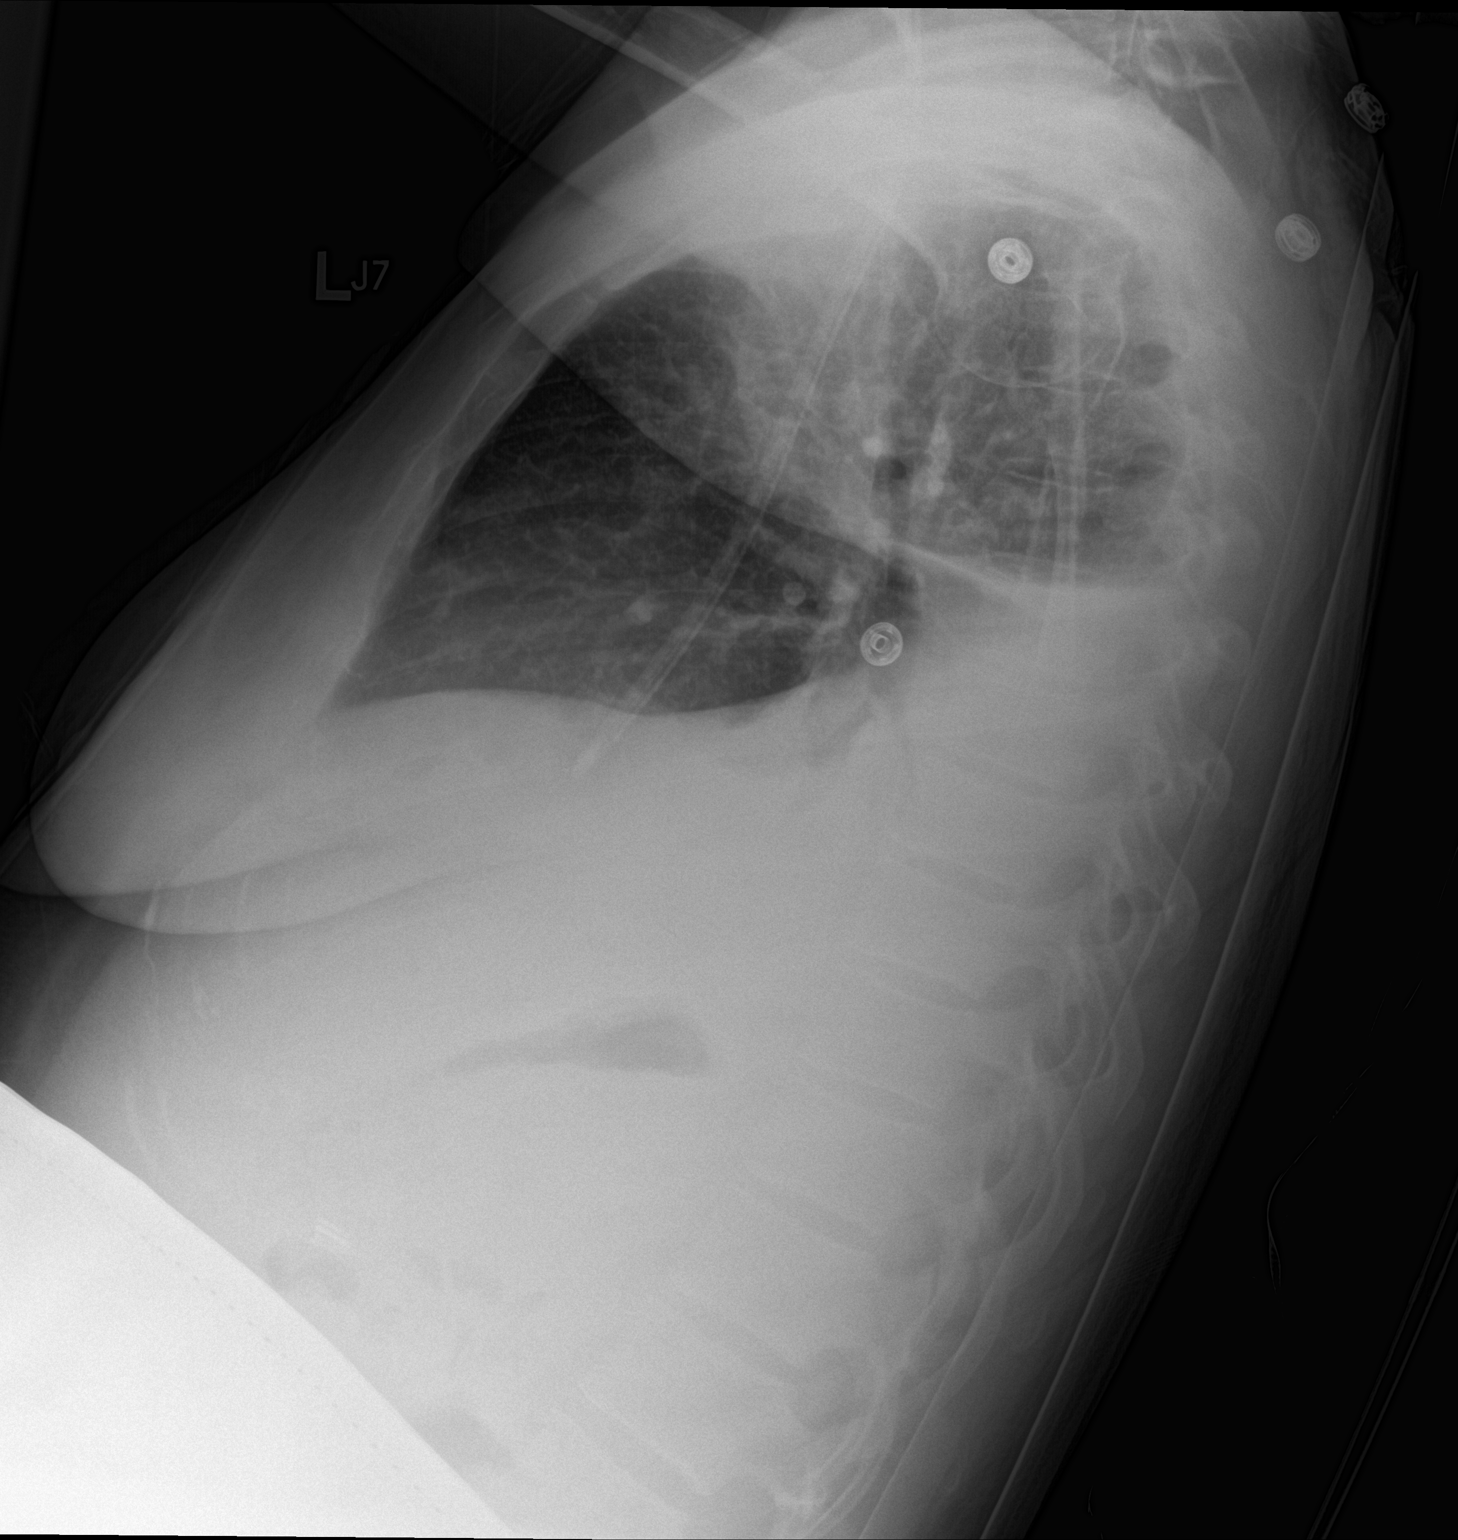

[chest ap]
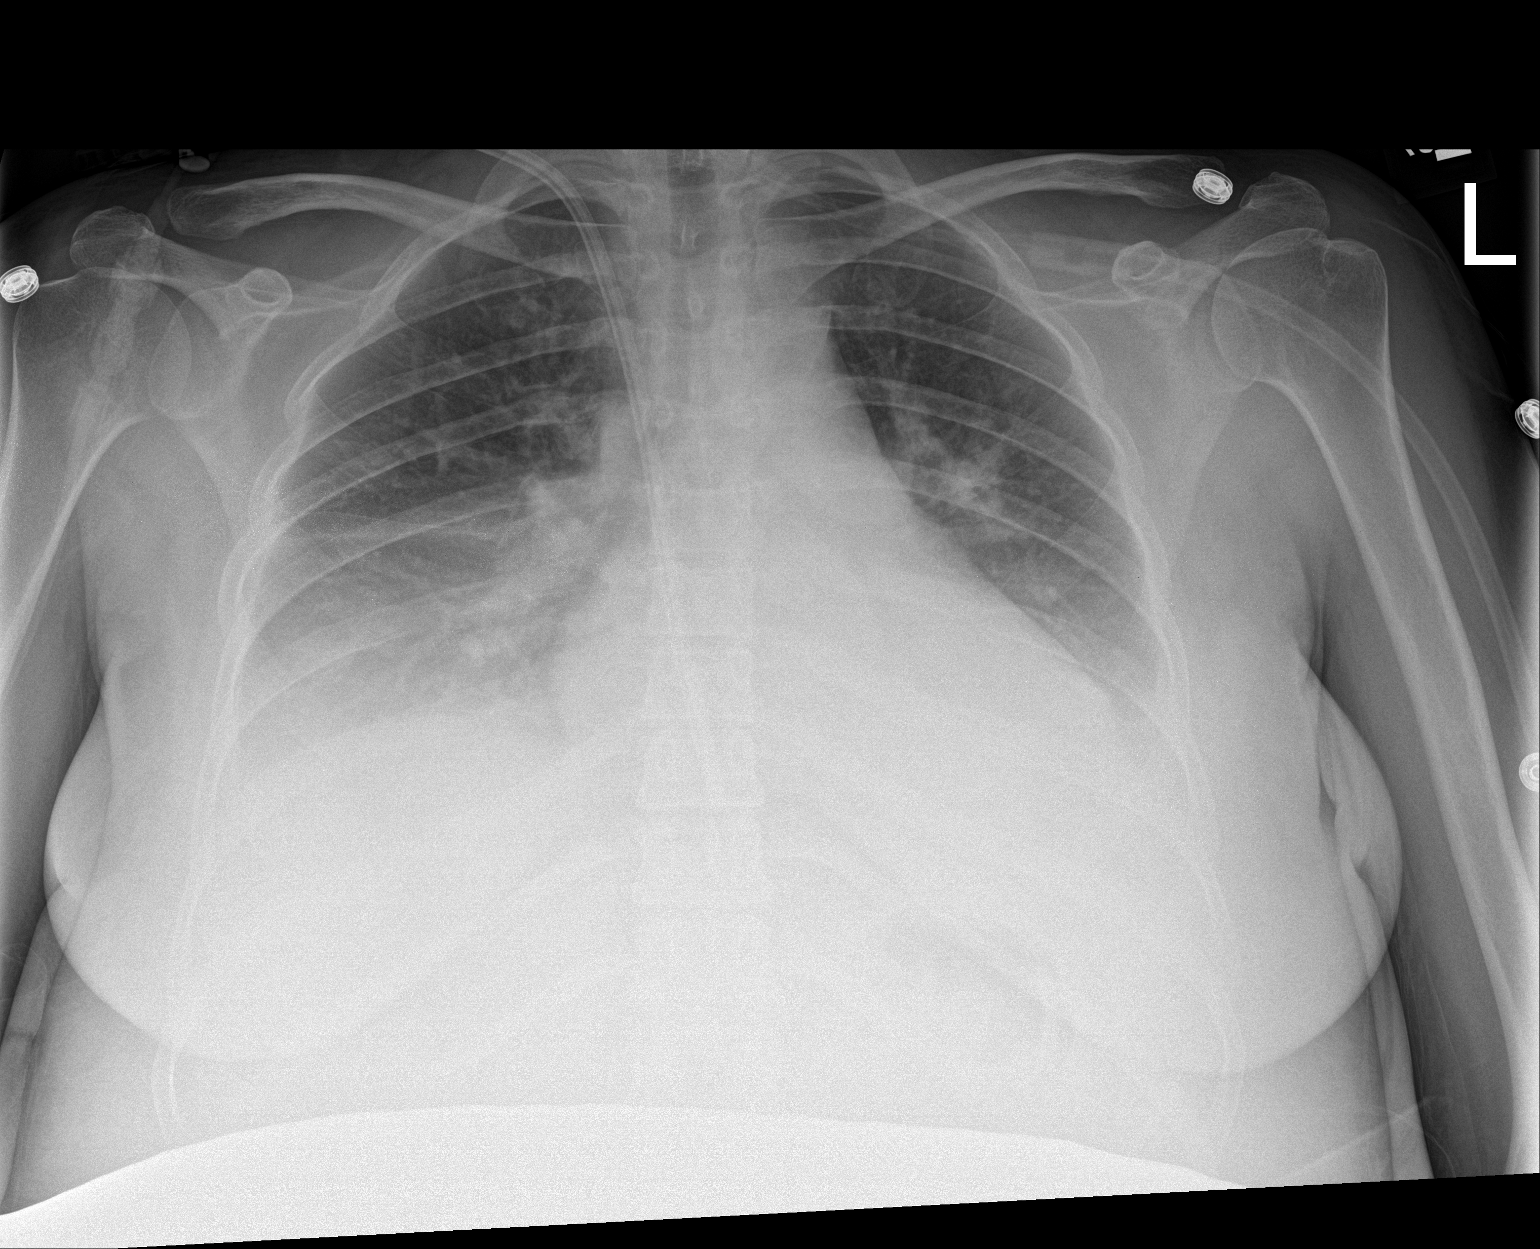

[2 of 2 positions shown; findings below may reference images not displayed]

FINDINGS: Moderate bilateral pleural effusions, without significant change
when allowing for differences in patient positioning. No evidence of
pulmonary edema. There is dependent lower lobe opacity adjacent to
the pleural effusions most likely all atelectasis. Pneumonia is not
excluded. No pneumothorax.

Cardiac silhouette is normal in size. No mediastinal or hilar masses
or evidence of adenopathy.

Right internal jugular dual-lumen central venous catheter is stable
with its tip projecting in the right atrium.
IMPRESSION: 1. Moderate bilateral pleural effusions. No pulmonary edema.
Dependent lower lobe opacity is likely atelectasis. Pneumonia is not
excluded.
2. No significant change from the prior study.

## 2017-06-20 ENCOUNTER — Other Ambulatory Visit: Payer: Self-pay | Admitting: Adult Health

## 2017-07-02 ENCOUNTER — Encounter: Payer: Self-pay | Admitting: Orthopaedic Surgery

## 2017-07-02 ENCOUNTER — Ambulatory Visit: Payer: Medicare Other | Admitting: Orthopaedic Surgery

## 2017-07-08 ENCOUNTER — Encounter (HOSPITAL_COMMUNITY): Payer: Self-pay | Admitting: Psychiatry

## 2017-07-08 ENCOUNTER — Ambulatory Visit (INDEPENDENT_AMBULATORY_CARE_PROVIDER_SITE_OTHER): Payer: Medicare Other | Admitting: Psychiatry

## 2017-07-08 VITALS — BP 126/84 | HR 77 | Ht 63.0 in | Wt 151.0 lb

## 2017-07-08 DIAGNOSIS — F331 Major depressive disorder, recurrent, moderate: Secondary | ICD-10-CM | POA: Diagnosis not present

## 2017-07-08 DIAGNOSIS — G4700 Insomnia, unspecified: Secondary | ICD-10-CM

## 2017-07-08 DIAGNOSIS — E108 Type 1 diabetes mellitus with unspecified complications: Secondary | ICD-10-CM

## 2017-07-08 MED ORDER — SERTRALINE HCL 100 MG PO TABS: ORAL_TABLET | ORAL | 1 refills | Status: DC

## 2017-07-08 NOTE — Patient Instructions (Signed)
1. Reinitiate sertraline 50 mg daily for one week, then 100 mg daily  2. Reinitiate Trazodone 25-50 mg at night as needed for sleep 3. Referral to therapy  4. Return to clinic in one month for 30 mins 5. Check with your doctor if you have recently checked for thyroid (TSH)

## 2017-07-08 NOTE — Progress Notes (Signed)
BH MD/PA/NP OP Progress Note  07/08/2017 2:43 PM Tracy [name]  MRN:  [id number]  Chief Complaint:  Chief Complaint    Depression; Follow-up     HPI:  Patient presents late for follow-up appointment for depression.  She rolled her eyes when she is asked about her mood. She has been feeling stressed with relationship with her husband. She feels that he may not want to have her around him. She talks about an episode of him refusing to driver her to feed her horse, although he initially agreed with it. She is concerned due to his history of drug use, although she understands that he has not been able to meet with his daughter yet. He shut down when she tries to talk. She was also told by him that she has been emotional, "hormonal" and she thinks it is true. She has been isolative. She wants to go back to her house by herself when she is out. She has insomnia. She feels fatigue. She has fair concentration. She has increased appetite. She denies SI. She has panic attacks a few times last week. She denies physical abuse from her husband, although they both know about their "buttons" each other.   Wt Readings from Last 3 Encounters:  07/08/17 151 lb (68.5 kg)  05/05/17 149 lb 6.4 oz (67.8 kg)  03/19/17 147 lb (66.7 kg)    Visit Diagnosis:    ICD-10-CM   1. MDD (major depressive disorder), recurrent episode, moderate ([place]) F33.1     Past Psychiatric History: Please see initial evaluation for full details. I have reviewed the history. No updates at this time.     Past Medical History:  Past Medical History:  Diagnosis Date  . Abscess of right thigh   . Anemia    presently on iron supplement  . Anxiety   . Atypical squamous cell changes of undetermined significance (ASCUS) on vaginal cytology with positive high risk human papilloma virus (HPV) 09/05/2016   Get colpo  . Chronic kidney disease    on dialysis T, Th, Sat  . CMV (cytomegalovirus infection) ([place])   . Depression   .  Detached retina   . Diabetes mellitus    insulin pump  Type 1  . History of organ transplantation 08/18/2015  . HSV-1 (herpes simplex virus 1) infection   . HSV-2 (herpes simplex virus 2) infection   . Hypertension   . Hypothyroidism   . Irregular periods 07/05/2014  . Neuropathy    in feet  . Seizures ([place]) 01/2014   ? due to blood sugar  . Thyroid enlargement    "not on medication at this time"  . Urinary tract infection    hx of  . Vaginal discharge 07/05/2014  . Yeast infection    took diflucan saturday     Past Surgical History:  Procedure Laterality Date  . APPLICATION OF A-CELL OF EXTREMITY Right 10/03/2014   Procedure: APPLICATION OF A-CELL OF RIGHT UPPER THIGH WOUND;  Surgeon: Renette Butters, MD;  Location: [facility];  Service: Orthopedics;  Laterality: Right;  . APPLICATION OF WOUND VAC Right 10/03/2014   Procedure: APPLICATION OF WOUND VAC RIGHT THIGH;  Surgeon: Renette Butters, MD;  Location: [facility];  Service: Orthopedics;  Laterality: Right;  . AV FISTULA PLACEMENT Left 09/01/2014   Procedure: BRACHIOCEPHALIC ARTERIOVENOUS (AV) FISTULA CREATION;  Surgeon: Conrad [place], MD;  Location: [place];  Service: Vascular;  Laterality: Left;  . CHOLECYSTECTOMY    . COMBINED KIDNEY-PANCREAS TRANSPLANT  05/15/15  . I&D EXTREMITY Right 08/14/2014   Procedure: IRRIGATION AND DEBRIDEMENT EXTREMITY WITH MUSCLE BIOPSY;  Surgeon: Roseanne Kaufman, MD;  Location: [facility] [place];  Service: Orthopedics;  Laterality: Right;  . I&D EXTREMITY Right 08/20/2014   Procedure:  I AND D LEG / THIGH ABSCESS AND MANIPULATION OF RIGHT ELBOW.;  Surgeon: Renette Butters, MD;  Location: [facility];  Service: Orthopedics;  Laterality: Right;  . I&D EXTREMITY Right 09/30/2014   Procedure: IRRIGATION AND DEBRIDEMENT RIGHT THIGH ABSCESS;  Surgeon: [name], MD;  Location: [facility];  Service: Orthopedics;  Laterality: Right;  . I&D EXTREMITY Right 10/03/2014   Procedure: IRRIGATION AND DEBRIDEMENT RIGHT THIGH ABSCESS,WITH WOUND CLOSURE &  MANIPULATION OF RIGHT KNEE;  Surgeon: Renette Butters, MD;  Location: [facility];  Service: Orthopedics;  Laterality: Right;  . INCISION AND DRAINAGE ABSCESS Right 12/09/2014   Procedure: INCISION AND DRAINAGE ABSCESS;  Surgeon: Renette Butters, MD;  Location: [place];  Service: Orthopedics;  Laterality: Right;  . INSERTION OF DIALYSIS CATHETER N/A 09/01/2014   Procedure: INSERTION OF DIALYSIS CATHETER IN RIGHT INTERNAL JUGUILAR;  Surgeon: Conrad [place], MD;  Location: [facility];  Service: Vascular;  Laterality: N/A;  . KIDNEY TRANSPLANT  05/15/15  . PARS PLANA VITRECTOMY  10/01/2011   Procedure: PARS PLANA VITRECTOMY WITH 25 GAUGE;  Surgeon: Hayden Pedro, MD;  Location: [facility];  Service: Ophthalmology;  Laterality: Right;  Repair Complex Traction Retinal Detachment  . PILONIDAL CYST EXCISION    . TRIGGER FINGER RELEASE Right 09/21/13  . WISDOM TOOTH EXTRACTION      Family Psychiatric History: Please see initial evaluation for full details. I have reviewed the history. No updates at this time.     Family History:  Family History  Problem Relation Age of Onset  . Cancer Paternal Grandfather        prostate  . Hyperlipidemia Paternal Grandfather   . Stroke Paternal Grandfather     Social History:  Social History   Socioeconomic History  . Marital status: Legally Separated    Spouse name: Audelia Acton  . Number of children: 0  . Years of education: College  . Highest education level: Not on file  Occupational History    Employer: [employer]  . Financial resource strain: Not on file  . Food insecurity:    Worry: Not on file    Inability: Not on file  . Transportation needs:    Medical: Not on file    Non-medical: Not on file  Tobacco Use  . Smoking status: Never Smoker  . Smokeless tobacco: Never Used  Substance and Sexual Activity  . Alcohol use: No    Alcohol/week: 0.0 oz  . Drug use: No  . Sexual activity: Yes    Birth control/protection: Injection  Lifestyle  . Physical  activity:    Days per week: Not on file    Minutes per session: Not on file  . Stress: Not on file  Relationships  . Social connections:    Talks on phone: Not on file    Gets together: Not on file    Attends religious service: Not on file    Active member of club or organization: Not on file    Attends meetings of clubs or organizations: Not on file    Relationship status: Not on file  Other Topics Concern  . Not on file  Social History Narrative   Patient lives at home with family.   Caffeine Use: 1  soda daily    Allergies:  Allergies  Allergen Reactions  . Penicillins Hives and Swelling    Has patient had a PCN reaction causing immediate rash, facial/tongue/throat swelling, SOB or lightheadedness with hypotension: Yes Has patient had a PCN reaction causing severe rash involving mucus membranes or skin necrosis: Yes Has patient had a PCN reaction that required hospitalization No Has patient had a PCN reaction occurring within the last 10 years: No If all of the above answers are "NO", then may proceed with Cephalosporin use.   . Sulfa Antibiotics Hives    Metabolic Disorder Labs: Lab Results  Component Value Date   HGBA1C 8.9 (H) 08/10/2014   MPG 209 08/10/2014   MPG 435 (H) 01/20/2013   No results found for: PROLACTIN Lab Results  Component Value Date   TRIG 114 01/20/2014   Lab Results  Component Value Date   TSH 15.859 (H) 08/19/2014   TSH 7.256 (H) 08/17/2014    Therapeutic Level Labs: No results found for: LITHIUM No results found for: VALPROATE No components found for:  CBMZ  Current Medications: Current Outpatient Medications  Medication Sig Dispense Refill  . acetaminophen (TYLENOL) 500 MG tablet Take 1,000 mg by mouth every 6 (six) hours as needed for mild pain or moderate pain.    Marland Kitchen aspirin EC 81 MG tablet Take 81 mg by mouth daily.     . dapsone 25 MG tablet Take 50 mg by mouth daily.  11  . doxycycline (VIBRA-TABS) 100 MG tablet Take 1 tablet  (100 mg total) by mouth 2 (two) times daily. 28 tablet 1  . fluconazole (DIFLUCAN) 150 MG tablet Take 1 now and 1 in 3 days if needed 2 tablet 1  . LORazepam (ATIVAN) 1 MG tablet TAKE 1 TABLET BY MOUTH AT BEDTIME 30 tablet 0  . medroxyPROGESTERone (DEPO-PROVERA) 150 MG/ML injection INJECT 1 ML (150 MG TOTAL) INTO THE MUSCLE EVERY 3 (THREE) MONTHS. 1 mL 2  . Multiple Vitamins-Minerals (MULTIVITAMIN WITH MINERALS) tablet Take 1 tablet by mouth daily.     . mycophenolate (MYFORTIC) 180 MG EC tablet Take 180 mg by mouth 2 (two) times daily.     . sertraline (ZOLOFT) 100 MG tablet Start 50 mg daily for one week, then 100 mg daily 30 tablet 1  . tacrolimus (PROGRAF) 1 MG capsule Take 2.5 mg by mouth 2 (two) times daily. 3 tabs BID    . traZODone (DESYREL) 50 MG tablet 25-50 mg at night as needed for sleep 90 tablet 0  . valACYclovir (VALTREX) 1000 MG tablet Take 1 bid x 10 days then can take daily 40 tablet 6   No current facility-administered medications for this visit.      Musculoskeletal: Strength & Muscle Tone: within normal limits Gait & Station: normal Patient leans: N/A  Psychiatric Specialty Exam: ROS 10 points ROS negative. Has insomnia, denies AH, VH, paranoia. +depressed, anxious. Negative for dizziness, blurred vision, headache, chest pain, shortness of breath.   Blood pressure 126/84, pulse 77, height 5\' 3"  (1.6 m), weight 151 lb (68.5 kg), SpO2 100 %.Body mass index is 26.75 kg/m.  General Appearance: Fairly Groomed  Eye Contact:  Good  Speech:  Clear and Coherent  Volume:  Normal  Mood:  Anxious and Depressed  Affect:  Appropriate, Congruent, Restricted, Tearful and down  Thought Process:  Coherent  Orientation:  Full (Time, Place, and Person)  Thought Content: Logical   Suicidal Thoughts:  No  Homicidal Thoughts:  No  Memory:  Immediate;   Good  Judgement:  Good  Insight:  Fair  Psychomotor Activity:  Normal  Concentration:  Concentration: Good and Attention Span:  Good  Recall:  Good  Fund of Knowledge: Good  Language: Good  Akathisia:  No  Handed:  Right  AIMS (if indicated): not done  Assets:  Communication Skills Desire for Improvement  ADL's:  Intact  Cognition: WNL  Sleep:  Poor   Screenings: PHQ2-9     Office Visit from 01/16/2017 in [facility] Office Visit from 08/30/2016 in [facility] Office Visit from 01/18/2016 in [facility] Office Visit from 12/19/2014 in [facility] Office Visit from 09/22/2014 in [facility]  PHQ-2 Total Score  2  3  2  1  3   PHQ-9 Total Score  5  9  8   -  6       Assessment and Plan:  Tracy [name] is a 32 y.o. year old female with a history of depression,SPK 06/02/2015 from a deceased unrelated donor. Underlying DM1 s/p HD 7/16, on immunosuppression, hypothyroidism,  who presents for follow up appointment for MDD (major depressive disorder), recurrent episode, moderate ([place])  # MDD, moderate, recurrent without psychotic features Patient continues to endorse neurovegetative symptoms and anxiety in the context of  marital discordance, running out of her medication.  Will reinitiate sertraline with uptitration to target depression. Will reinitiate trazodone prn for insomnia. Will hold ativan given patient reports limited benefit from it. She will greatly benefit from CBT and to work on effective communication; referral is made.She is informed of petition process if there is any concern of her husband becoming danger to self or others.    Plan I have reviewed and updated plans as below 1. Reinitiate sertraline 50 mg daily for one week, then 100 mg daily  2. Reinitiate Trazodone 25-50 mg at night as needed for sleep (she will contact the clinic if it needs to be filled) 3. Referral to therapy  4. Return to clinic in one month for 30 mins 5. Check with your doctor if you have recently checked for thyroid (TSH) for  hypothyroidism  The patient demonstrates the following risk factors for suicide: Chronic risk factors for suicide include: psychiatric disorder of depression. Acute risk factors for suicide include: family or marital conflict. Protective factors for this patient include: positive social support, coping skills and hope for the future. Considering these factors, the overall suicide risk at  The duration of this appointment visit was 30 minutes of face-to-face time with the patient.  Greater than 50% of this time was spent in counseling, explanation of  diagnosis, planning of further management, and coordination of care.   Norman Clay, MD 07/08/2017, 2:43 PM

## 2017-07-17 ENCOUNTER — Encounter: Payer: Self-pay | Admitting: Orthopaedic Surgery

## 2017-07-17 ENCOUNTER — Ambulatory Visit (INDEPENDENT_AMBULATORY_CARE_PROVIDER_SITE_OTHER): Payer: Medicare Other | Admitting: Orthopaedic Surgery

## 2017-07-17 VITALS — BP 127/81 | HR 72 | Ht 62.0 in | Wt 155.0 lb

## 2017-07-17 DIAGNOSIS — S52592A Other fractures of lower end of left radius, initial encounter for closed fracture: Secondary | ICD-10-CM | POA: Diagnosis not present

## 2017-07-17 NOTE — Progress Notes (Signed)
Subjective:    Patient ID: Tracy [name], female    DOB: 05-04-1985, 32 y.o.   MRN: [id number]  HPI She fell missing a step on 06-29-17.  Her left wrist was injured.  She was seen at [facility].  X-rays were negative then.  She resumed normal activity including riding horses.  She continued to have pain, more with rotation of the wrist and heavy activity such as putting on saddle on her horse.  She saw Dr. Delsa Bern again at [facility] and new X-rays were done on 07-15-17.  This time a subtle nondisplaced incomplete fracture of the distal radius metaphysis was noted.  She was asked to be seen here today.  She has no redness, no swelling.  She has no other injury.  She is post pancreas and kidney transplant.  She has done very well from it, now two years later.  Her A1C is 4.3 now.   Review of Systems  Constitutional: Positive for activity change.  Respiratory: Negative for cough and shortness of breath.   Cardiovascular: Negative for chest pain and leg swelling.  Musculoskeletal: Positive for arthralgias.  Psychiatric/Behavioral: The patient is nervous/anxious.   All other systems reviewed and are negative.  Past Medical History:  Diagnosis Date  . Abscess of right thigh   . Anemia    presently on iron supplement  . Anxiety   . Atypical squamous cell changes of undetermined significance (ASCUS) on vaginal cytology with positive high risk human papilloma virus (HPV) 09/05/2016   Get colpo  . Chronic kidney disease    on dialysis T, Th, Sat  . CMV (cytomegalovirus infection) ([place])   . Depression   . Detached retina   . Diabetes mellitus    insulin pump  Type 1  . History of organ transplantation 08/18/2015  . HSV-1 (herpes simplex virus 1) infection   . HSV-2 (herpes simplex virus 2) infection   . Hypertension   . Hypothyroidism   . Irregular periods 07/05/2014  . Neuropathy    in feet  . Seizures ([place]) 01/2014   ? due to blood sugar  . Thyroid enlargement    "not on  medication at this time"  . Urinary tract infection    hx of  . Vaginal discharge 07/05/2014  . Yeast infection    took diflucan saturday     Past Surgical History:  Procedure Laterality Date  . APPLICATION OF A-CELL OF EXTREMITY Right 10/03/2014   Procedure: APPLICATION OF A-CELL OF RIGHT UPPER THIGH WOUND;  Surgeon: Renette Butters, MD;  Location: [place];  Service: Orthopedics;  Laterality: Right;  . APPLICATION OF WOUND VAC Right 10/03/2014   Procedure: APPLICATION OF WOUND VAC RIGHT THIGH;  Surgeon: Renette Butters, MD;  Location: [place];  Service: Orthopedics;  Laterality: Right;  . AV FISTULA PLACEMENT Left 09/01/2014   Procedure: BRACHIOCEPHALIC ARTERIOVENOUS (AV) FISTULA CREATION;  Surgeon: Conrad [place], MD;  Location: [facility];  Service: Vascular;  Laterality: Left;  . CHOLECYSTECTOMY    . COMBINED KIDNEY-PANCREAS TRANSPLANT  05/15/15  . I&D EXTREMITY Right 08/14/2014   Procedure: IRRIGATION AND DEBRIDEMENT EXTREMITY WITH MUSCLE BIOPSY;  Surgeon: Roseanne Kaufman, MD;  Location: [facility];  Service: Orthopedics;  Laterality: Right;  . I&D EXTREMITY Right 08/20/2014   Procedure:  I AND D LEG / THIGH ABSCESS AND MANIPULATION OF RIGHT ELBOW.;  Surgeon: Renette Butters, MD;  Location: [facility];  Service: Orthopedics;  Laterality: Right;  . I&D EXTREMITY Right 09/30/2014   Procedure:  IRRIGATION AND DEBRIDEMENT RIGHT THIGH ABSCESS;  Surgeon: [name], MD;  Location: [facility];  Service: Orthopedics;  Laterality: Right;  . I&D EXTREMITY Right 10/03/2014   Procedure: IRRIGATION AND DEBRIDEMENT RIGHT THIGH ABSCESS,WITH WOUND CLOSURE & MANIPULATION OF RIGHT KNEE;  Surgeon: Renette Butters, MD;  Location: [place];  Service: Orthopedics;  Laterality: Right;  . INCISION AND DRAINAGE ABSCESS Right 12/09/2014   Procedure: INCISION AND DRAINAGE ABSCESS;  Surgeon: Renette Butters, MD;  Location: [place];  Service: Orthopedics;  Laterality: Right;  . INSERTION OF DIALYSIS CATHETER N/A 09/01/2014   Procedure: INSERTION OF  DIALYSIS CATHETER IN RIGHT INTERNAL JUGUILAR;  Surgeon: Conrad [place], MD;  Location: [place];  Service: Vascular;  Laterality: N/A;  . KIDNEY TRANSPLANT  05/15/15  . PARS PLANA VITRECTOMY  10/01/2011   Procedure: PARS PLANA VITRECTOMY WITH 25 GAUGE;  Surgeon: Hayden Pedro, MD;  Location: [place];  Service: Ophthalmology;  Laterality: Right;  Repair Complex Traction Retinal Detachment  . PILONIDAL CYST EXCISION    . TRIGGER FINGER RELEASE Right 09/21/13  . WISDOM TOOTH EXTRACTION      Current Outpatient Medications on File Prior to Visit  Medication Sig Dispense Refill  . dapsone 25 MG tablet Take 50 mg by mouth daily.  11  . medroxyPROGESTERone (DEPO-PROVERA) 150 MG/ML injection INJECT 1 ML (150 MG TOTAL) INTO THE MUSCLE EVERY 3 (THREE) MONTHS. 1 mL 2  . Multiple Vitamins-Minerals (MULTIVITAMIN WITH MINERALS) tablet Take 1 tablet by mouth daily.     . sertraline (ZOLOFT) 100 MG tablet Start 50 mg daily for one week, then 100 mg daily 30 tablet 1  . tacrolimus (PROGRAF) 1 MG capsule Take 2.5 mg by mouth 2 (two) times daily. 3 tabs BID    . traZODone (DESYREL) 50 MG tablet 25-50 mg at night as needed for sleep 90 tablet 0  . acetaminophen (TYLENOL) 500 MG tablet Take 1,000 mg by mouth every 6 (six) hours as needed for mild pain or moderate pain.    Marland Kitchen aspirin EC 81 MG tablet Take 81 mg by mouth daily.     Marland Kitchen LORazepam (ATIVAN) 1 MG tablet TAKE 1 TABLET BY MOUTH AT BEDTIME (Patient not taking: Reported on 07/17/2017) 30 tablet 0  . mycophenolate (MYFORTIC) 180 MG EC tablet Take 180 mg by mouth 2 (two) times daily.     . valACYclovir (VALTREX) 1000 MG tablet Take 1 bid x 10 days then can take daily (Patient not taking: Reported on 07/17/2017) 40 tablet 6   No current facility-administered medications on file prior to visit.     Social History   Socioeconomic History  . Marital status: Legally Separated    Spouse name: Audelia Acton  . Number of children: 0  . Years of education: College  . Highest  education level: Not on file  Occupational History    Employer: [employer]  . Financial resource strain: Not on file  . Food insecurity:    Worry: Not on file    Inability: Not on file  . Transportation needs:    Medical: Not on file    Non-medical: Not on file  Tobacco Use  . Smoking status: Never Smoker  . Smokeless tobacco: Never Used  Substance and Sexual Activity  . Alcohol use: No    Alcohol/week: 0.0 oz  . Drug use: No  . Sexual activity: Yes    Birth control/protection: Injection  Lifestyle  . Physical activity:    Days per week:  Not on file    Minutes per session: Not on file  . Stress: Not on file  Relationships  . Social connections:    Talks on phone: Not on file    Gets together: Not on file    Attends religious service: Not on file    Active member of club or organization: Not on file    Attends meetings of clubs or organizations: Not on file    Relationship status: Not on file  . Intimate partner violence:    Fear of current or ex partner: Not on file    Emotionally abused: Not on file    Physically abused: Not on file    Forced sexual activity: Not on file  Other Topics Concern  . Not on file  Social History Narrative   Patient lives at home with family.   Caffeine Use: 1 soda daily    Family History  Problem Relation Age of Onset  . Cancer Paternal Grandfather        prostate  . Hyperlipidemia Paternal Grandfather   . Stroke Paternal Grandfather     BP 127/81   Pulse 72   Ht 5\' 2"  (1.575 m)   Wt 155 lb (70.3 kg)   BMI 28.35 kg/m      Objective:   Physical Exam  Constitutional: She is oriented to person, place, and time. She appears well-developed and well-nourished.  HENT:  Head: Normocephalic and atraumatic.  Eyes: Pupils are equal, round, and reactive to light. Conjunctivae and EOM are normal.  Neck: Normal range of motion. Neck supple.  Cardiovascular: Normal rate, regular rhythm and intact distal pulses.    Pulmonary/Chest: Effort normal.  Abdominal: Soft.  Musculoskeletal:       Left wrist: She exhibits tenderness.       Arms: Neurological: She is alert and oriented to person, place, and time. She has normal reflexes. She displays normal reflexes. No cranial nerve deficit. She exhibits normal muscle tone. Coordination normal.  Skin: Skin is warm and dry.  Psychiatric: She has a normal mood and affect. Her behavior is normal. Judgment and thought content normal.    I have reviewed the x-rays and report and [facility] notes.      Assessment & Plan:   Encounter Diagnosis  Name Primary?  . Other closed fracture of distal end of left radius, initial encounter Yes   I have explained to her about her injury and need to avoid any falls or trauma for the next several weeks.  I have given a cock-up splint to use.  Return in two weeks.  X-rays on return.  Call if any problem.  Precautions discussed.   Electronically Signed Sanjuana Kava, MD 6/6/20198:39 AM

## 2017-07-20 ENCOUNTER — Other Ambulatory Visit: Payer: Self-pay | Admitting: Adult Health

## 2017-07-20 DIAGNOSIS — Z3042 Encounter for surveillance of injectable contraceptive: Secondary | ICD-10-CM

## 2017-07-28 ENCOUNTER — Ambulatory Visit: Payer: BLUE CROSS/BLUE SHIELD

## 2017-07-30 ENCOUNTER — Ambulatory Visit: Payer: Medicare Other

## 2017-07-31 ENCOUNTER — Encounter: Payer: Self-pay | Admitting: *Deleted

## 2017-07-31 ENCOUNTER — Ambulatory Visit: Payer: Medicare Other | Admitting: Orthopaedic Surgery

## 2017-07-31 ENCOUNTER — Ambulatory Visit (INDEPENDENT_AMBULATORY_CARE_PROVIDER_SITE_OTHER): Payer: 59 | Admitting: *Deleted

## 2017-07-31 VITALS — Wt 153.0 lb

## 2017-07-31 DIAGNOSIS — Z3202 Encounter for pregnancy test, result negative: Secondary | ICD-10-CM | POA: Diagnosis not present

## 2017-07-31 DIAGNOSIS — Z3042 Encounter for surveillance of injectable contraceptive: Secondary | ICD-10-CM | POA: Diagnosis not present

## 2017-07-31 LAB — POCT URINE PREGNANCY: Preg Test, Ur: NEGATIVE

## 2017-07-31 MED ORDER — MEDROXYPROGESTERONE ACETATE 150 MG/ML IM SUSP
150.0000 mg | Freq: Once | INTRAMUSCULAR | Status: AC
  Administered 2017-07-31: 150 mg via INTRAMUSCULAR

## 2017-07-31 NOTE — Progress Notes (Signed)
Depo Provera 150mg IM given in right deltoid with no complications. Pt to return in 12 weeks for next injection.  

## 2017-08-06 ENCOUNTER — Ambulatory Visit: Payer: Medicare Other | Admitting: Orthopaedic Surgery

## 2017-08-06 NOTE — Progress Notes (Deleted)
BH MD/PA/NP OP Progress Note  08/06/2017 10:00 AM Tracy [name]  MRN:  [id number]  Chief Complaint:  HPI: *** Visit Diagnosis: No diagnosis found.  Past Psychiatric History: Please see initial evaluation for full details. I have reviewed the history. No updates at this time.     Past Medical History:  Past Medical History:  Diagnosis Date  . Abscess of right thigh   . Anemia    presently on iron supplement  . Anxiety   . Atypical squamous cell changes of undetermined significance (ASCUS) on vaginal cytology with positive high risk human papilloma virus (HPV) 09/05/2016   Get colpo  . Chronic kidney disease    on dialysis T, Th, Sat  . CMV (cytomegalovirus infection) ([place])   . Depression   . Detached retina   . Diabetes mellitus    insulin pump  Type 1  . History of organ transplantation 08/18/2015  . HSV-1 (herpes simplex virus 1) infection   . HSV-2 (herpes simplex virus 2) infection   . Hypertension   . Hypothyroidism   . Irregular periods 07/05/2014  . Neuropathy    in feet  . Seizures ([place]) 01/2014   ? due to blood sugar  . Thyroid enlargement    "not on medication at this time"  . Urinary tract infection    hx of  . Vaginal discharge 07/05/2014  . Yeast infection    took diflucan saturday     Past Surgical History:  Procedure Laterality Date  . APPLICATION OF A-CELL OF EXTREMITY Right 10/03/2014   Procedure: APPLICATION OF A-CELL OF RIGHT UPPER THIGH WOUND;  Surgeon: Renette Butters, MD;  Location: [place];  Service: Orthopedics;  Laterality: Right;  . APPLICATION OF WOUND VAC Right 10/03/2014   Procedure: APPLICATION OF WOUND VAC RIGHT THIGH;  Surgeon: Renette Butters, MD;  Location: [place];  Service: Orthopedics;  Laterality: Right;  . AV FISTULA PLACEMENT Left 09/01/2014   Procedure: BRACHIOCEPHALIC ARTERIOVENOUS (AV) FISTULA CREATION;  Surgeon: Conrad Cypress, MD;  Location: [place];  Service: Vascular;  Laterality: Left;  . CHOLECYSTECTOMY    . COMBINED  KIDNEY-PANCREAS TRANSPLANT  05/15/15  . I&D EXTREMITY Right 08/14/2014   Procedure: IRRIGATION AND DEBRIDEMENT EXTREMITY WITH MUSCLE BIOPSY;  Surgeon: Roseanne Kaufman, MD;  Location: [place];  Service: Orthopedics;  Laterality: Right;  . I&D EXTREMITY Right 08/20/2014   Procedure:  I AND D LEG / THIGH ABSCESS AND MANIPULATION OF RIGHT ELBOW.;  Surgeon: Renette Butters, MD;  Location: [place];  Service: Orthopedics;  Laterality: Right;  . I&D EXTREMITY Right 09/30/2014   Procedure: IRRIGATION AND DEBRIDEMENT RIGHT THIGH ABSCESS;  Surgeon: [name], MD;  Location: [place];  Service: Orthopedics;  Laterality: Right;  . I&D EXTREMITY Right 10/03/2014   Procedure: IRRIGATION AND DEBRIDEMENT RIGHT THIGH ABSCESS,WITH WOUND CLOSURE & MANIPULATION OF RIGHT KNEE;  Surgeon: Renette Butters, MD;  Location: [facility];  Service: Orthopedics;  Laterality: Right;  . INCISION AND DRAINAGE ABSCESS Right 12/09/2014   Procedure: INCISION AND DRAINAGE ABSCESS;  Surgeon: Renette Butters, MD;  Location: [place];  Service: Orthopedics;  Laterality: Right;  . INSERTION OF DIALYSIS CATHETER N/A 09/01/2014   Procedure: INSERTION OF DIALYSIS CATHETER IN RIGHT INTERNAL JUGUILAR;  Surgeon: Conrad Cobden, MD;  Location: [place];  Service: Vascular;  Laterality: N/A;  . KIDNEY TRANSPLANT  05/15/15  . PARS PLANA VITRECTOMY  10/01/2011   Procedure: PARS PLANA VITRECTOMY WITH 25 GAUGE;  Surgeon: Hayden Pedro,  MD;  Location: [place];  Service: Ophthalmology;  Laterality: Right;  Repair Complex Traction Retinal Detachment  . PILONIDAL CYST EXCISION    . TRIGGER FINGER RELEASE Right 09/21/13  . WISDOM TOOTH EXTRACTION      Family Psychiatric History: Please see initial evaluation for full details. I have reviewed the history. No updates at this time.     Family History:  Family History  Problem Relation Age of Onset  . Cancer Paternal Grandfather        prostate  . Hyperlipidemia Paternal Grandfather   . Stroke Paternal Grandfather      Social History:  Social History   Socioeconomic History  . Marital status: Legally Separated    Spouse name: Audelia Acton  . Number of children: 0  . Years of education: College  . Highest education level: Not on file  Occupational History    Employer: [employer]  . Financial resource strain: Not on file  . Food insecurity:    Worry: Not on file    Inability: Not on file  . Transportation needs:    Medical: Not on file    Non-medical: Not on file  Tobacco Use  . Smoking status: Never Smoker  . Smokeless tobacco: Never Used  Substance and Sexual Activity  . Alcohol use: No    Alcohol/week: 0.0 oz  . Drug use: No  . Sexual activity: Yes    Birth control/protection: Injection  Lifestyle  . Physical activity:    Days per week: Not on file    Minutes per session: Not on file  . Stress: Not on file  Relationships  . Social connections:    Talks on phone: Not on file    Gets together: Not on file    Attends religious service: Not on file    Active member of club or organization: Not on file    Attends meetings of clubs or organizations: Not on file    Relationship status: Not on file  Other Topics Concern  . Not on file  Social History Narrative   Patient lives at home with family.   Caffeine Use: 1 soda daily    Allergies:  Allergies  Allergen Reactions  . Penicillins Hives and Swelling    Has patient had a PCN reaction causing immediate rash, facial/tongue/throat swelling, SOB or lightheadedness with hypotension: Yes Has patient had a PCN reaction causing severe rash involving mucus membranes or skin necrosis: Yes Has patient had a PCN reaction that required hospitalization No Has patient had a PCN reaction occurring within the last 10 years: No If all of the above answers are "NO", then may proceed with Cephalosporin use.   . Sulfa Antibiotics Hives    Metabolic Disorder Labs: Lab Results  Component Value Date   HGBA1C 8.9 (H) 08/10/2014   MPG 209  08/10/2014   MPG 435 (H) 01/20/2013   No results found for: PROLACTIN Lab Results  Component Value Date   TRIG 114 01/20/2014   Lab Results  Component Value Date   TSH 15.859 (H) 08/19/2014   TSH 7.256 (H) 08/17/2014    Therapeutic Level Labs: No results found for: LITHIUM No results found for: VALPROATE No components found for:  CBMZ  Current Medications: Current Outpatient Medications  Medication Sig Dispense Refill  . acetaminophen (TYLENOL) 500 MG tablet Take 1,000 mg by mouth every 6 (six) hours as needed for mild pain or moderate pain.    Marland Kitchen aspirin EC 81 MG tablet Take 81  mg by mouth daily.     . dapsone 25 MG tablet Take 50 mg by mouth daily.  11  . LORazepam (ATIVAN) 1 MG tablet TAKE 1 TABLET BY MOUTH AT BEDTIME (Patient not taking: Reported on 07/17/2017) 30 tablet 0  . medroxyPROGESTERone (DEPO-PROVERA) 150 MG/ML injection INJECT 1 ML (150 MG TOTAL) INTO THE MUSCLE EVERY 3 (THREE) MONTHS. 1 mL 4  . Multiple Vitamins-Minerals (MULTIVITAMIN WITH MINERALS) tablet Take 1 tablet by mouth daily.     . mycophenolate (MYFORTIC) 180 MG EC tablet Take 180 mg by mouth 2 (two) times daily.     . sertraline (ZOLOFT) 100 MG tablet Start 50 mg daily for one week, then 100 mg daily 30 tablet 1  . tacrolimus (PROGRAF) 1 MG capsule Take 2.5 mg by mouth 2 (two) times daily. 3 tabs BID    . traZODone (DESYREL) 50 MG tablet 25-50 mg at night as needed for sleep 90 tablet 0  . valACYclovir (VALTREX) 1000 MG tablet Take 1 bid x 10 days then can take daily (Patient not taking: Reported on 07/17/2017) 40 tablet 6   No current facility-administered medications for this visit.      Musculoskeletal: Strength & Muscle Tone: within normal limits Gait & Station: normal Patient leans: N/A  Psychiatric Specialty Exam: ROS  There were no vitals taken for this visit.There is no height or weight on file to calculate BMI.  General Appearance: Fairly Groomed  Eye Contact:  Good  Speech:  Clear and  Coherent  Volume:  Normal  Mood:  {BHH MOOD:22306}  Affect:  {Affect (PAA):22687}  Thought Process:  Coherent  Orientation:  Full (Time, Place, and Person)  Thought Content: Logical   Suicidal Thoughts:  {ST/HT (PAA):22692}  Homicidal Thoughts:  {ST/HT (PAA):22692}  Memory:  Immediate;   Good  Judgement:  {Judgement (PAA):22694}  Insight:  {Insight (PAA):22695}  Psychomotor Activity:  Normal  Concentration:  Concentration: Good and Attention Span: Good  Recall:  Good  Fund of Knowledge: Good  Language: Good  Akathisia:  No  Handed:  Right  AIMS (if indicated): not done  Assets:  Communication Skills Desire for Improvement  ADL's:  Intact  Cognition: WNL  Sleep:  {BHH GOOD/FAIR/POOR:22877}   Screenings: PHQ2-9     Office Visit from 01/16/2017 in [place] Office Visit from 08/30/2016 in [place] Office Visit from 01/18/2016 in [place] Office Visit from 12/19/2014 in [facility] for [facility] Office Visit from 09/22/2014 in [facility]  PHQ-2 Total Score  2  3  2  1  3   PHQ-9 Total Score  5  9  8   -  6       Assessment and Plan:  LEXXI KOSLOW is a 32 y.o. year old female with a history of depression,  SPK 06/03/2015 from a deceased unrelated donor. Underlying DM1 s/p HD 7/16, on immunosuppression, hypothyroidism, who presents for follow up appointment for No diagnosis found.   # MDD, moderate, recurrent without psychotic features  Patient continues to endorse neurovegetative symptoms and anxiety in the context of  marital discordance, running out of her medication.  Will reinitiate sertraline with uptitration to target depression. Will reinitiate trazodone prn for insomnia. Will hold ativan given patient reports limited benefit from it. She will greatly benefit from CBT and to work on effective communication; referral is made.She is informed of petition process if there is any concern of  her husband  becoming danger to self or others.    Plan  1. Reinitiate sertraline 50 mg daily for one week, then 100 mg daily  2. Reinitiate Trazodone 25-50 mg at night as needed for sleep (she will contact the clinic if it needs to be filled) 3. Referral to therapy  4. Return to clinic in one month for 30 mins 5. Check with yourdoctor if you have recently checked for thyroid (TSH) for hypothyroidism  The patient demonstrates the following risk factors for suicide: Chronic risk factors for suicide include:psychiatric disorder ofdepression. Acute risk factorsfor suicide include: family or marital conflict. Protective factorsfor this patient include: positive social support, coping skills and hope for the future. Considering these factors, the overall suicide risk at    Norman Clay, MD 08/06/2017, 10:00 AM

## 2017-08-11 ENCOUNTER — Ambulatory Visit (HOSPITAL_COMMUNITY): Payer: Medicare Other | Admitting: Psychiatry

## 2017-09-01 ENCOUNTER — Other Ambulatory Visit: Payer: Self-pay | Admitting: Adult Health

## 2017-09-03 ENCOUNTER — Ambulatory Visit (HOSPITAL_COMMUNITY): Payer: Medicare Other | Admitting: Licensed Clinical Social Worker

## 2017-09-17 ENCOUNTER — Other Ambulatory Visit: Payer: 59 | Admitting: Adult Health

## 2017-09-24 ENCOUNTER — Telehealth: Payer: Self-pay | Admitting: Adult Health

## 2017-09-24 ENCOUNTER — Other Ambulatory Visit (HOSPITAL_COMMUNITY): Payer: Self-pay | Admitting: Psychiatry

## 2017-09-24 MED ORDER — SERTRALINE HCL 100 MG PO TABS
100.0000 mg | ORAL_TABLET | Freq: Every day | ORAL | 1 refills | Status: DC
Start: 2017-09-24 — End: 2018-01-23

## 2017-09-24 NOTE — Telephone Encounter (Signed)
Pt stressed at home, not sleeping, to come in at 4 pm tomorrow

## 2017-09-25 ENCOUNTER — Telehealth: Payer: Self-pay | Admitting: Adult Health

## 2017-09-25 ENCOUNTER — Ambulatory Visit: Payer: Medicare Other | Admitting: Adult Health

## 2017-09-25 NOTE — Telephone Encounter (Signed)
Patient called this morning stating that she can not come to her appointment this afternoon, she states that she knows that Anderson Malta worked her in but a a girl called out and she has to be at work. Pt would like to be worked in on another day. Please contact pt

## 2017-09-25 NOTE — Telephone Encounter (Signed)
Left message could see in am at 8:30 as work in, just let me know

## 2017-10-01 ENCOUNTER — Other Ambulatory Visit: Payer: Self-pay | Admitting: Adult Health

## 2017-10-06 ENCOUNTER — Encounter: Payer: Self-pay | Admitting: Adult Health

## 2017-10-06 ENCOUNTER — Ambulatory Visit (INDEPENDENT_AMBULATORY_CARE_PROVIDER_SITE_OTHER): Payer: 59 | Admitting: Adult Health

## 2017-10-06 ENCOUNTER — Other Ambulatory Visit (HOSPITAL_COMMUNITY)
Admission: RE | Admit: 2017-10-06 | Discharge: 2017-10-06 | Disposition: A | Discharge status: Home / Self Care | Payer: Medicare Other | Source: Ambulatory Visit | Attending: Adult Health | Admitting: Adult Health

## 2017-10-06 ENCOUNTER — Other Ambulatory Visit: Payer: Self-pay

## 2017-10-06 VITALS — BP 121/74 | HR 68 | Resp 16 | Ht 63.0 in | Wt 156.0 lb

## 2017-10-06 DIAGNOSIS — R87612 Low grade squamous intraepithelial lesion on cytologic smear of cervix (LGSIL): Secondary | ICD-10-CM | POA: Diagnosis not present

## 2017-10-06 DIAGNOSIS — Z87898 Personal history of other specified conditions: Secondary | ICD-10-CM | POA: Diagnosis not present

## 2017-10-06 DIAGNOSIS — Z3042 Encounter for surveillance of injectable contraceptive: Secondary | ICD-10-CM

## 2017-10-06 DIAGNOSIS — Z01419 Encounter for gynecological examination (general) (routine) without abnormal findings: Secondary | ICD-10-CM

## 2017-10-06 DIAGNOSIS — Z1151 Encounter for screening for human papillomavirus (HPV): Secondary | ICD-10-CM | POA: Insufficient documentation

## 2017-10-06 DIAGNOSIS — Z01411 Encounter for gynecological examination (general) (routine) with abnormal findings: Secondary | ICD-10-CM | POA: Diagnosis not present

## 2017-10-06 DIAGNOSIS — F419 Anxiety disorder, unspecified: Secondary | ICD-10-CM

## 2017-10-06 DIAGNOSIS — Z8742 Personal history of other diseases of the female genital tract: Secondary | ICD-10-CM | POA: Insufficient documentation

## 2017-10-06 MED ORDER — TRAZODONE HCL 50 MG PO TABS: ORAL_TABLET | ORAL | 1 refills | Status: DC

## 2017-10-06 NOTE — Progress Notes (Addendum)
Patient ID: Tracy [name], female   DOB: 1985-03-05, 32 y.o.   MRN: [id number] History of Present Illness:  Tracy [name] is a 32 year old white female, married in for well woman gyn exam and pap. She got married in March. Last pap was 08/30/16 ASCUS with +HPV.  Current Medications, Allergies, Past Medical History, Past Surgical History, Family History and Social History were reviewed in [employer] record.     Review of Systems: Patient denies any headaches, hearing loss, fatigue, blurred vision, shortness of breath, chest pain, abdominal pain, problems with bowel movements, urination, or intercourse. No joint pain or mood swings. +stressed, not sleeping (husband has court about daughter in near future, and he is stressed too). She says amylase is elevated and is having labs this week, to recheck.    Physical Exam:BP 121/74 (BP Location: Right Arm, Patient Position: Sitting, Cuff Size: Normal)   Pulse 68   Resp 16   Ht 5\' 3"  (1.6 m)   Wt 156 lb (70.8 kg)   BMI 27.63 kg/m  General:  Well developed, well nourished, no acute distress Skin:  Warm and dry Neck:  Midline trachea, normal thyroid, good ROM, no lymphadenopathy Lungs; Clear to auscultation bilaterally Breast:  No dominant palpable mass, retraction, or nipple discharge Cardiovascular: Regular rate and rhythm Abdomen:  Soft, non tender, no hepatosplenomegaly Pelvic:  External genitalia is normal in appearance, no lesions.  The vagina is normal in appearance. Urethra has no lesions or masses. The cervix is nulliparous, pap with HPV performed.Marland Kitchen  Uterus is felt to be normal size, shape, and contour.  No adnexal masses or tenderness noted.Bladder is non tender, no masses felt. Extremities/musculoskeletal:  No swelling or varicosities noted, no clubbing or cyanosis Psych:  No mood changes, alert and cooperative,seems stressed, not happy, discussed that she needs to keep communication open with husband, even if it is  just notes and that she can not control the world, just herself.  PHQ 2 score 1.Will increase trazodone. She sees Dr Harrington Challenger too.  Examination chaperoned by Shela Nevin RN.   Impression: 1. Encounter for gynecological examination with Papanicolaou smear of cervix   2. History of abnormal cervical Pap smear   3. Encounter for surveillance of injectable contraceptive   4. Anxiety       Plan: Continue depo, has refills  Meds ordered this encounter  Medications  . traZODone (DESYREL) 50 MG tablet    Sig: Take 2 at HS    Dispense:  60 tablet    Refill:  1    Order Specific Question:   Supervising Provider    Answer:   Florian Buff [[id number]]  Physical and pap in 1 year

## 2017-10-09 LAB — CYTOLOGY - PAP: HPV: DETECTED — AB

## 2017-10-14 ENCOUNTER — Telehealth: Payer: Self-pay | Admitting: Adult Health

## 2017-10-14 DIAGNOSIS — Z8742 Personal history of other diseases of the female genital tract: Secondary | ICD-10-CM

## 2017-10-14 MED ORDER — FLUCONAZOLE 150 MG PO TABS: ORAL_TABLET | ORAL | 1 refills | Status: DC

## 2017-10-14 NOTE — Telephone Encounter (Signed)
Left message that pap LSIL with +HPV and yeast, [facility], call and make colpo appt.

## 2017-10-23 ENCOUNTER — Ambulatory Visit (INDEPENDENT_AMBULATORY_CARE_PROVIDER_SITE_OTHER): Payer: 59

## 2017-10-23 ENCOUNTER — Ambulatory Visit: Payer: 59

## 2017-10-23 VITALS — Ht 63.0 in | Wt 152.0 lb

## 2017-10-23 DIAGNOSIS — Z3202 Encounter for pregnancy test, result negative: Secondary | ICD-10-CM

## 2017-10-23 DIAGNOSIS — Z3042 Encounter for surveillance of injectable contraceptive: Secondary | ICD-10-CM

## 2017-10-23 LAB — POCT URINE PREGNANCY: PREG TEST UR: NEGATIVE

## 2017-10-23 MED ORDER — MEDROXYPROGESTERONE ACETATE 150 MG/ML IM SUSP
150.0000 mg | Freq: Once | INTRAMUSCULAR | Status: AC
  Administered 2017-10-23: 150 mg via INTRAMUSCULAR

## 2017-10-23 NOTE — Progress Notes (Signed)
Pt here for depo injection 150 mg IM given rt deltoid. Tolerated well. Return 12 weeks for next injection. Pad CMA

## 2017-10-24 ENCOUNTER — Encounter: Payer: Medicare Other | Admitting: Obstetrics & Gynecology

## 2017-11-10 ENCOUNTER — Other Ambulatory Visit: Payer: Self-pay | Admitting: Adult Health

## 2017-11-18 ENCOUNTER — Encounter: Payer: Medicare Other | Admitting: Obstetrics & Gynecology

## 2017-11-24 ENCOUNTER — Encounter: Payer: Medicare Other | Admitting: Obstetrics & Gynecology

## 2017-12-04 ENCOUNTER — Encounter: Payer: Medicare Other | Admitting: Obstetrics & Gynecology

## 2017-12-22 ENCOUNTER — Encounter: Payer: Medicare Other | Admitting: Obstetrics & Gynecology

## 2017-12-22 ENCOUNTER — Other Ambulatory Visit: Payer: Self-pay | Admitting: Adult Health

## 2018-01-15 ENCOUNTER — Ambulatory Visit: Payer: 59

## 2018-01-20 ENCOUNTER — Encounter: Payer: Medicare Other | Admitting: Obstetrics & Gynecology

## 2018-01-23 ENCOUNTER — Ambulatory Visit (INDEPENDENT_AMBULATORY_CARE_PROVIDER_SITE_OTHER): Payer: 59 | Admitting: Obstetrics & Gynecology

## 2018-01-23 ENCOUNTER — Encounter: Payer: Self-pay | Admitting: Obstetrics & Gynecology

## 2018-01-23 ENCOUNTER — Ambulatory Visit: Payer: Medicare Other

## 2018-01-23 VITALS — BP 129/76 | HR 75 | Ht 63.0 in | Wt 160.0 lb

## 2018-01-23 DIAGNOSIS — Z3202 Encounter for pregnancy test, result negative: Secondary | ICD-10-CM | POA: Diagnosis not present

## 2018-01-23 DIAGNOSIS — Z3042 Encounter for surveillance of injectable contraceptive: Secondary | ICD-10-CM | POA: Diagnosis not present

## 2018-01-23 DIAGNOSIS — Z308 Encounter for other contraceptive management: Secondary | ICD-10-CM

## 2018-01-23 DIAGNOSIS — N870 Mild cervical dysplasia: Secondary | ICD-10-CM | POA: Diagnosis not present

## 2018-01-23 LAB — POCT URINE PREGNANCY: Preg Test, Ur: NEGATIVE

## 2018-01-23 MED ORDER — MEDROXYPROGESTERONE ACETATE 150 MG/ML IM SUSP
150.0000 mg | Freq: Once | INTRAMUSCULAR | Status: AC
  Administered 2018-01-23: 150 mg via INTRAMUSCULAR

## 2018-01-23 NOTE — Progress Notes (Signed)
Patient ID: Tracy [name], female   DOB: 12-30-1985, 32 y.o.   MRN: [id number] Colposcopy Procedure Note:  Colposcopy Procedure Note  Pt is S/p renal/pancreas transplant and is on immune suppressive therapy  Indications: Pap smear 3 months ago showed: low-grade squamous intraepithelial neoplasia (LGSIL - encompassing HPV,mild dysplasia,CIN I). The prior pap showed ASCUS with POSITIVE high risk HPV.  Prior cervical/vaginal disease: HPV related changes. Prior cervical treatment: no treatment.  Smoker:  No. New sexual partner:  No.    History of abnormal Pap: yes  Procedure Details  The risks and benefits of the procedure and Written informed consent obtained.  Speculum placed in vagina and excellent visualization of cervix achieved, cervix swabbed x 3 with acetic acid solution.  Findings: Cervix: no visible lesions, no mosaicism, no punctation and no abnormal vasculature; SCJ visualized 360 degrees without lesions and no biopsies taken. Vaginal inspection: normal without visible lesions. Vulvar colposcopy: vulvar colposcopy not performed.  Specimens: none  Complications: none.  Plan: Enouraged to start lysine supplementation Repeat Pap test 1 year with reflex HPV identification if positive

## 2018-01-23 NOTE — Progress Notes (Signed)
Pt received Depo in right deltoid. Pt tolerated shot well. Return in 12 weeks for next shot. Onset

## 2018-04-10 ENCOUNTER — Other Ambulatory Visit: Payer: Self-pay | Admitting: Adult Health

## 2018-04-21 ENCOUNTER — Ambulatory Visit (INDEPENDENT_AMBULATORY_CARE_PROVIDER_SITE_OTHER): Payer: Medicare Other

## 2018-04-21 VITALS — Ht 63.0 in | Wt 163.8 lb

## 2018-04-21 DIAGNOSIS — Z3042 Encounter for surveillance of injectable contraceptive: Secondary | ICD-10-CM

## 2018-04-21 DIAGNOSIS — Z3202 Encounter for pregnancy test, result negative: Secondary | ICD-10-CM | POA: Diagnosis not present

## 2018-04-21 LAB — POCT URINE PREGNANCY: PREG TEST UR: NEGATIVE

## 2018-04-21 MED ORDER — MEDROXYPROGESTERONE ACETATE 150 MG/ML IM SUSP
150.0000 mg | Freq: Once | INTRAMUSCULAR | Status: AC
  Administered 2018-04-21: 150 mg via INTRAMUSCULAR

## 2018-04-21 NOTE — Progress Notes (Signed)
Pt here for depo injection 150 mg IM given rt deltoid. Tolerated well. Return 12 weeks for next injection. Pad CMA

## 2018-04-22 ENCOUNTER — Other Ambulatory Visit: Payer: Self-pay | Admitting: Adult Health

## 2018-04-22 ENCOUNTER — Ambulatory Visit: Payer: Self-pay

## 2018-06-02 ENCOUNTER — Other Ambulatory Visit: Payer: Self-pay | Admitting: Adult Health

## 2018-06-26 ENCOUNTER — Other Ambulatory Visit: Payer: Self-pay | Admitting: Adult Health

## 2018-07-02 ENCOUNTER — Other Ambulatory Visit: Payer: Self-pay | Admitting: Adult Health

## 2018-07-14 ENCOUNTER — Ambulatory Visit: Payer: Self-pay

## 2018-07-15 ENCOUNTER — Ambulatory Visit (INDEPENDENT_AMBULATORY_CARE_PROVIDER_SITE_OTHER): Payer: 59 | Admitting: *Deleted

## 2018-07-15 ENCOUNTER — Telehealth: Payer: Self-pay | Admitting: *Deleted

## 2018-07-15 ENCOUNTER — Other Ambulatory Visit: Payer: Self-pay

## 2018-07-15 DIAGNOSIS — Z3042 Encounter for surveillance of injectable contraceptive: Secondary | ICD-10-CM | POA: Diagnosis not present

## 2018-07-15 MED ORDER — MEDROXYPROGESTERONE ACETATE 150 MG/ML IM SUSP
150.0000 mg | Freq: Once | INTRAMUSCULAR | Status: AC
  Administered 2018-07-15: 150 mg via INTRAMUSCULAR

## 2018-07-15 NOTE — Progress Notes (Signed)
Depo Provera 150 mg given IM in right deltoid. Patient tolerated well. Next dose in 12 weeks.

## 2018-07-15 NOTE — Telephone Encounter (Signed)
Seen ER at [facility], 5/31, thrown off a horse,an steeped on  for no fractures, has pain with deep breaths, can't get comfortable to sleep, was given hydrocodone. See MD in [place]

## 2018-07-15 NOTE — Telephone Encounter (Signed)
Pt came by for depo today. She stated that she had fallen off of a horse and was stepped on on Sunday She went to the ED and had xrays and was given a few percocet. She does not have any more pain meds and is hurting. She is not able to sleep well, due to the pain. Wanted to see if jennifer can send in rx for something for pain. Patient would like Jenn to call her if possible.

## 2018-08-05 ENCOUNTER — Other Ambulatory Visit: Payer: Self-pay | Admitting: Adult Health

## 2018-09-09 ENCOUNTER — Telehealth: Payer: Self-pay | Admitting: Adult Health

## 2018-09-09 NOTE — Telephone Encounter (Signed)
Please call pt would like to talk to [facility] about some bleeding she is having. Pretty heavy. Goes off and then comes back on/ would like Jenn to give her a call

## 2018-09-10 MED ORDER — MEGESTROL ACETATE 40 MG PO TABS: ORAL_TABLET | ORAL | 1 refills | Status: DC

## 2018-09-10 NOTE — Addendum Note (Signed)
Addended by: Derrek Monaco A on: 09/10/2018 02:34 PM   Modules accepted: Orders

## 2018-09-10 NOTE — Telephone Encounter (Signed)
On depo, had cramps last week and had bleeding with clots, and it stopped, and  now having on and off spotting.Will rx megace, has used in the past.

## 2018-10-03 ENCOUNTER — Other Ambulatory Visit: Payer: Self-pay | Admitting: Adult Health

## 2018-10-06 ENCOUNTER — Telehealth: Payer: Self-pay | Admitting: Obstetrics & Gynecology

## 2018-10-06 NOTE — Telephone Encounter (Signed)
Called patient and left message informing her that we are not allowing any visitors or children to come to visit with her at this time and we are requiring a mask to be worn during the visit. Asked if she has had any exposure to anyone suspected or confirmed of having COVID-19 or if she is experiencing any of the following: fever, cough, sob, muscle pain, severe headache, sore throat, diarrhea, loss of taste or smell or ear, nose or throat discomfort to call and reschedule.   °

## 2018-10-07 ENCOUNTER — Other Ambulatory Visit: Payer: Self-pay | Admitting: Adult Health

## 2018-10-07 ENCOUNTER — Ambulatory Visit: Payer: 59

## 2018-10-07 DIAGNOSIS — Z3042 Encounter for surveillance of injectable contraceptive: Secondary | ICD-10-CM

## 2018-10-08 ENCOUNTER — Other Ambulatory Visit: Payer: Self-pay | Admitting: Obstetrics & Gynecology

## 2018-10-08 ENCOUNTER — Ambulatory Visit: Payer: 59

## 2018-10-08 DIAGNOSIS — Z3042 Encounter for surveillance of injectable contraceptive: Secondary | ICD-10-CM

## 2018-10-08 MED ORDER — MEDROXYPROGESTERONE ACETATE 150 MG/ML IM SUSP: 150.0000 mg | INTRAMUSCULAR | 4 refills | Status: DC

## 2018-10-08 NOTE — Telephone Encounter (Signed)
Patient needs a refill for her depo.  [facility]  [phone]

## 2018-10-09 ENCOUNTER — Telehealth: Payer: Self-pay | Admitting: Obstetrics & Gynecology

## 2018-10-09 NOTE — Telephone Encounter (Signed)
Called patient and left message informing her that we are not allowing any visitors or children to come to visit with her at this time and we are requiring a mask to be worn during the visit. Asked if she has had any exposure to anyone suspected or confirmed of having COVID-19 or if she is experiencing any of the following: fever, cough, sob, muscle pain, severe headache, sore throat, diarrhea, loss of taste or smell or ear, nose or throat discomfort to call and reschedule.   °

## 2018-10-12 ENCOUNTER — Ambulatory Visit: Payer: 59

## 2018-10-13 ENCOUNTER — Other Ambulatory Visit: Payer: Self-pay

## 2018-10-13 ENCOUNTER — Ambulatory Visit (INDEPENDENT_AMBULATORY_CARE_PROVIDER_SITE_OTHER): Payer: 59

## 2018-10-13 VITALS — Ht 63.0 in | Wt 155.2 lb

## 2018-10-13 DIAGNOSIS — Z3042 Encounter for surveillance of injectable contraceptive: Secondary | ICD-10-CM | POA: Diagnosis not present

## 2018-10-13 MED ORDER — MEDROXYPROGESTERONE ACETATE 150 MG/ML IM SUSP
150.0000 mg | Freq: Once | INTRAMUSCULAR | Status: AC
  Administered 2018-10-13: 150 mg via INTRAMUSCULAR

## 2018-10-13 NOTE — Progress Notes (Signed)
Pt here for depo injectable 150 mg IM given rt deltoid. Tolerated well. Return 12 weeks for next injection.Pad CMA

## 2018-10-29 ENCOUNTER — Other Ambulatory Visit: Payer: Self-pay | Admitting: Women's Health

## 2018-11-02 ENCOUNTER — Other Ambulatory Visit: Payer: Self-pay | Admitting: Adult Health

## 2018-11-03 ENCOUNTER — Other Ambulatory Visit: Payer: Self-pay | Admitting: *Deleted

## 2018-11-03 MED ORDER — LORAZEPAM 1 MG PO TABS: ORAL_TABLET | ORAL | 0 refills | Status: DC

## 2018-11-09 ENCOUNTER — Other Ambulatory Visit: Payer: Self-pay | Admitting: Women's Health

## 2018-11-20 ENCOUNTER — Other Ambulatory Visit: Payer: Self-pay

## 2018-11-20 DIAGNOSIS — Z20822 Contact with and (suspected) exposure to covid-19: Secondary | ICD-10-CM

## 2018-11-22 LAB — NOVEL CORONAVIRUS, NAA: SARS-CoV-2, NAA: NOT DETECTED

## 2018-11-23 ENCOUNTER — Telehealth: Payer: Self-pay | Admitting: General Practice

## 2018-11-23 NOTE — Telephone Encounter (Signed)
Gave patient negative covid test  °Patient understood  °

## 2018-11-26 ENCOUNTER — Ambulatory Visit (HOSPITAL_COMMUNITY): Payer: Self-pay | Admitting: Psychiatry

## 2018-12-05 ENCOUNTER — Other Ambulatory Visit: Payer: Self-pay | Admitting: Adult Health

## 2018-12-08 ENCOUNTER — Other Ambulatory Visit: Payer: Self-pay | Admitting: Women's Health

## 2018-12-08 ENCOUNTER — Telehealth: Payer: Self-pay | Admitting: *Deleted

## 2018-12-08 MED ORDER — FLUCONAZOLE 150 MG PO TABS: ORAL_TABLET | ORAL | 1 refills | Status: DC

## 2018-12-08 NOTE — Telephone Encounter (Signed)
Will rx diflucan  

## 2018-12-08 NOTE — Telephone Encounter (Signed)
Pt has recently been on abx. Would like diflucan sent in.

## 2018-12-10 ENCOUNTER — Other Ambulatory Visit (HOSPITAL_COMMUNITY)
Admission: RE | Admit: 2018-12-10 | Discharge: 2018-12-10 | Disposition: A | Discharge status: Home / Self Care | Payer: 59 | Source: Ambulatory Visit | Attending: Obstetrics and Gynecology | Admitting: Obstetrics and Gynecology

## 2018-12-10 ENCOUNTER — Other Ambulatory Visit: Payer: Self-pay | Admitting: Adult Health

## 2018-12-10 ENCOUNTER — Other Ambulatory Visit: Payer: Self-pay

## 2018-12-10 ENCOUNTER — Other Ambulatory Visit (INDEPENDENT_AMBULATORY_CARE_PROVIDER_SITE_OTHER): Payer: 59 | Admitting: *Deleted

## 2018-12-10 DIAGNOSIS — R8290 Unspecified abnormal findings in urine: Secondary | ICD-10-CM

## 2018-12-10 DIAGNOSIS — N898 Other specified noninflammatory disorders of vagina: Secondary | ICD-10-CM | POA: Insufficient documentation

## 2018-12-10 LAB — POCT URINALYSIS DIPSTICK
Glucose, UA: NEGATIVE
Ketones, UA: NEGATIVE
Nitrite, UA: NEGATIVE
Protein, UA: NEGATIVE

## 2018-12-10 MED ORDER — NITROFURANTOIN MONOHYD MACRO 100 MG PO CAPS: 100.0000 mg | ORAL_CAPSULE | Freq: Two times a day (BID) | ORAL | 0 refills | Status: DC

## 2018-12-10 NOTE — Progress Notes (Signed)
Will rx macrobid  ?

## 2018-12-10 NOTE — Progress Notes (Signed)
Will rx macrobid Chart reviewed for nurse visit. Agree with plan of care.  Estill Dooms, NP 12/10/2018 1:11 PM

## 2018-12-10 NOTE — Progress Notes (Signed)
   NURSE VISIT- VAGINITIS  SUBJECTIVE:  Tracy [name] is a 33 y.o. G2P0020 GYN patientfemale here for a vaginal swab for vaginitis screening.  She reports the following symptoms: discharge described as white and creamy for 4 days. Denies abnormal vaginal bleeding, significant pelvic pain, fever, or UTI symptoms.  OBJECTIVE:  There were no vitals taken for this visit.  Appears well, in no apparent distress  ASSESSMENT: Vaginal swab for vaginitis screening  PLAN: Self-collected vaginal probe for Bacterial Vaginosis, Yeast sent to lab Treatment: to be determined once results are received Follow-up as needed if symptoms persist/worsen, or new symptoms develop      NURSE VISIT- UTI SYMPTOMS   SUBJECTIVE:  Tracy [name] is a 33 y.o. G39P0020 female here for UTI symptoms. She is a GYN patient. She reports cloudy urine, urinary retention and odor with urine.  OBJECTIVE:  There were no vitals taken for this visit.  Appears well, in no apparent distress  Results for orders placed or performed in visit on 12/10/18 (from the past 24 hour(s))  POCT Urinalysis Dipstick   Collection Time: 12/10/18  9:11 AM  Result Value Ref Range   Color, UA     Clarity, UA     Glucose, UA Negative Negative   Bilirubin, UA     Ketones, UA neg    Spec Grav, UA     Blood, UA trace    pH, UA     Protein, UA Negative Negative   Urobilinogen, UA     Nitrite, UA neg    Leukocytes, UA Trace (A) Negative   Appearance     Odor      ASSESSMENT: GYN patient with UTI symptoms and negative nitrites  PLAN: Note routed to Derrek Monaco, AGNP   Rx sent by provider today: Yes Urine culture sent Call or return to clinic prn if these symptoms worsen or fail to improve as anticipated. Follow-up: as needed   Levy Pupa  12/10/2018 9:19 AM

## 2018-12-11 ENCOUNTER — Telehealth: Payer: Self-pay | Admitting: Adult Health

## 2018-12-11 LAB — CERVICOVAGINAL ANCILLARY ONLY
Bacterial Vaginitis (gardnerella): POSITIVE — AB
Candida Glabrata: NEGATIVE
Candida Vaginitis: POSITIVE — AB
Comment: NEGATIVE
Comment: NEGATIVE
Comment: NEGATIVE

## 2018-12-11 MED ORDER — FLUCONAZOLE 150 MG PO TABS: ORAL_TABLET | ORAL | 1 refills | Status: DC

## 2018-12-11 MED ORDER — METRONIDAZOLE 500 MG PO TABS: 500.0000 mg | ORAL_TABLET | Freq: Two times a day (BID) | ORAL | 0 refills | Status: DC

## 2018-12-11 NOTE — Telephone Encounter (Signed)
Left message that CV swab +BV and yeast will rx diflucan and flagyl

## 2018-12-14 ENCOUNTER — Telehealth: Payer: Self-pay | Admitting: Adult Health

## 2018-12-14 LAB — URINE CULTURE

## 2018-12-14 NOTE — Telephone Encounter (Signed)
Pt aware that urine culture +E coli, Macrobid should take care of it

## 2019-01-04 ENCOUNTER — Telehealth: Payer: Self-pay | Admitting: Obstetrics and Gynecology

## 2019-01-04 ENCOUNTER — Other Ambulatory Visit: Payer: Self-pay | Admitting: Adult Health

## 2019-01-04 NOTE — Telephone Encounter (Signed)
Called patient regarding appointment and the following message was left: ° ° °We have you scheduled for an upcoming appointment at our office. At this time, we are still not allowing visitors or children during the appointment, however, a support person, over age 34, may accompany you to your appointment if assistance is needed for safety or care concerns. Otherwise, support persons should remain outside until the visit is complete.  ° °We ask if you have had any exposure to anyone suspected or confirmed of having COVID-19 or if you are experiencing any of the following, to call and reschedule your appointment: fever, cough, shortness of breath, muscle pain, diarrhea, rash, vomiting, abdominal pain, red eye, weakness, bruising, bleeding, joint pain, or a severe headache.  ° °Please know we will ask you these questions or similar questions when you arrive for your appointment and again it’s how we are keeping everyone safe.   ° °Also,to keep you safe, please use the provided hand sanitizer when you enter the office. We are asking everyone in the office to wear a mask to help prevent the spread of °germs. If you have a mask of your own, please wear it to your appointment, if not, we are happy to provide one for you. ° °Thank you for understanding and your cooperation.  ° ° °[facility] Staff ° ° ° ° ° °

## 2019-01-05 ENCOUNTER — Other Ambulatory Visit: Payer: Self-pay

## 2019-01-05 ENCOUNTER — Ambulatory Visit (INDEPENDENT_AMBULATORY_CARE_PROVIDER_SITE_OTHER): Payer: 59 | Admitting: *Deleted

## 2019-01-05 DIAGNOSIS — Z3042 Encounter for surveillance of injectable contraceptive: Secondary | ICD-10-CM | POA: Diagnosis not present

## 2019-01-05 MED ORDER — MEDROXYPROGESTERONE ACETATE 150 MG/ML IM SUSP
150.0000 mg | Freq: Once | INTRAMUSCULAR | Status: AC
  Administered 2019-01-05: 150 mg via INTRAMUSCULAR

## 2019-01-05 NOTE — Progress Notes (Signed)
   NURSE VISIT- INJECTION  SUBJECTIVE:  Tracy [name] is a 33 y.o. G69P0020 female here for a Depo Provera for contraception/period management. She is a GYN patient.   OBJECTIVE:  There were no vitals taken for this visit.  Appears well, in no apparent distress  Injection administered in: Right deltoid  No orders of the defined types were placed in this encounter.   ASSESSMENT: GYN patient Depo Provera for contraception/period management PLAN: Follow-up: in 11-13 weeks for next Depo   Alece Koppel, Celene Squibb  01/05/2019 11:03 AM

## 2019-01-13 ENCOUNTER — Other Ambulatory Visit: Payer: Self-pay | Admitting: Adult Health

## 2019-01-26 ENCOUNTER — Other Ambulatory Visit: Payer: Self-pay | Admitting: Adult Health

## 2019-02-18 ENCOUNTER — Other Ambulatory Visit: Payer: 59

## 2019-02-18 ENCOUNTER — Other Ambulatory Visit: Payer: Self-pay

## 2019-02-18 ENCOUNTER — Ambulatory Visit: Payer: 59 | Attending: Internal Medicine

## 2019-02-18 DIAGNOSIS — Z20822 Contact with and (suspected) exposure to covid-19: Secondary | ICD-10-CM

## 2019-02-19 ENCOUNTER — Other Ambulatory Visit: Payer: 59

## 2019-02-20 LAB — NOVEL CORONAVIRUS, NAA: SARS-CoV-2, NAA: NOT DETECTED

## 2019-02-25 ENCOUNTER — Telehealth: Payer: Self-pay | Admitting: *Deleted

## 2019-02-25 NOTE — Telephone Encounter (Signed)
Patient called to verify negative covid results.

## 2019-03-23 ENCOUNTER — Ambulatory Visit: Payer: 59

## 2019-03-30 ENCOUNTER — Other Ambulatory Visit: Payer: Self-pay

## 2019-03-30 ENCOUNTER — Ambulatory Visit (INDEPENDENT_AMBULATORY_CARE_PROVIDER_SITE_OTHER): Payer: 59 | Admitting: *Deleted

## 2019-03-30 DIAGNOSIS — Z3042 Encounter for surveillance of injectable contraceptive: Secondary | ICD-10-CM | POA: Diagnosis not present

## 2019-03-30 MED ORDER — MEDROXYPROGESTERONE ACETATE 150 MG/ML IM SUSP
150.0000 mg | Freq: Once | INTRAMUSCULAR | Status: AC
  Administered 2019-03-30: 11:00:00 150 mg via INTRAMUSCULAR

## 2019-03-30 NOTE — Progress Notes (Signed)
   NURSE VISIT- INJECTION  SUBJECTIVE:  Tracy [name] is a 34 y.o. G43P0020 female here for a Depo Provera for contraception/period management. She is a GYN patient.   OBJECTIVE:  There were no vitals taken for this visit.  Appears well, in no apparent distress  Injection administered in: Right deltoid  Meds ordered this encounter  Medications  . medroxyPROGESTERone (DEPO-PROVERA) injection 150 mg    ASSESSMENT: GYN patient Depo Provera for contraception/period management PLAN: Follow-up: in 11-13 weeks for next Depo   Alice Rieger  03/30/2019 11:27 AM

## 2019-04-06 ENCOUNTER — Telehealth: Payer: Self-pay | Admitting: *Deleted

## 2019-04-06 NOTE — Telephone Encounter (Signed)
Left message I called and will be out of office tomorrow but back on Thursday

## 2019-04-06 NOTE — Telephone Encounter (Signed)
Pt left message requesting that Anderson Malta call her back.

## 2019-04-17 ENCOUNTER — Ambulatory Visit: Payer: 59 | Attending: Internal Medicine

## 2019-04-17 DIAGNOSIS — Z23 Encounter for immunization: Secondary | ICD-10-CM | POA: Insufficient documentation

## 2019-04-17 NOTE — Progress Notes (Signed)
   Covid-19 Vaccination Clinic  Name:  Tracy [name]    MRN: [id number] DOB: Feb 14, 1985  04/17/2019  Tracy [name] was observed post Covid-19 immunization for 15 minutes without incident. She was provided with Vaccine Information Sheet and instruction to access the V-Safe system.   Tracy [name] was instructed to call 911 with any severe reactions post vaccine: Marland Kitchen Difficulty breathing  . Swelling of face and throat  . A fast heartbeat  . A bad rash all over body  . Dizziness and weakness   Immunizations Administered    Name Date Dose VIS Date Route   Moderna COVID-19 Vaccine 04/17/2019  8:23 AM 0.5 mL 01/12/2019 Intramuscular   Manufacturer: Moderna   Lot: [id number]   [place]: [id number]

## 2019-05-19 ENCOUNTER — Ambulatory Visit: Payer: 59 | Attending: Internal Medicine

## 2019-05-19 DIAGNOSIS — Z23 Encounter for immunization: Secondary | ICD-10-CM

## 2019-05-19 NOTE — Progress Notes (Signed)
   Covid-19 Vaccination Clinic  Name:  Tracy [name]    MRN: [id number] DOB: August 07, 1985  05/19/2019  [name] was observed post Covid-19 immunization for 15 minutes without incident. She was provided with Vaccine Information Sheet and instruction to access the V-Safe system.   [name] was instructed to call 911 with any severe reactions post vaccine: Marland Kitchen Difficulty breathing  . Swelling of face and throat  . A fast heartbeat  . A bad rash all over body  . Dizziness and weakness   Immunizations Administered    Name Date Dose VIS Date Route   Moderna COVID-19 Vaccine 05/19/2019  3:50 PM 0.5 mL 01/12/2019 Intramuscular   Manufacturer: Levan Hurst   Lot: [id number]   [place]: [id number]

## 2019-05-24 ENCOUNTER — Other Ambulatory Visit: Payer: Self-pay | Admitting: Adult Health

## 2019-06-22 ENCOUNTER — Ambulatory Visit: Payer: 59

## 2019-06-23 ENCOUNTER — Other Ambulatory Visit: Payer: Self-pay | Admitting: Adult Health

## 2019-06-23 ENCOUNTER — Telehealth: Payer: Self-pay | Admitting: Adult Health

## 2019-06-23 MED ORDER — ONDANSETRON 8 MG PO TBDP: 8.0000 mg | ORAL_TABLET | Freq: Three times a day (TID) | ORAL | 0 refills | Status: DC | PRN

## 2019-06-23 NOTE — Telephone Encounter (Signed)
will Rx zofran

## 2019-06-23 NOTE — Telephone Encounter (Signed)
Pt needing something called in for nausea. She is unable to hold anything down due to her UTI.

## 2019-06-23 NOTE — Telephone Encounter (Signed)
Pt is requesting something for nausea and vomiting due to UTI. Thanks!! [place]

## 2019-06-23 NOTE — Addendum Note (Signed)
Addended by: Derrek Monaco A on: 06/23/2019 04:42 PM   Modules accepted: Orders

## 2019-06-25 ENCOUNTER — Ambulatory Visit (INDEPENDENT_AMBULATORY_CARE_PROVIDER_SITE_OTHER): Payer: 59

## 2019-06-25 ENCOUNTER — Other Ambulatory Visit: Payer: Self-pay

## 2019-06-25 VITALS — Wt 163.4 lb

## 2019-06-25 DIAGNOSIS — Z3042 Encounter for surveillance of injectable contraceptive: Secondary | ICD-10-CM | POA: Diagnosis not present

## 2019-06-25 MED ORDER — MEDROXYPROGESTERONE ACETATE 150 MG/ML IM SUSP
150.0000 mg | Freq: Once | INTRAMUSCULAR | Status: AC
  Administered 2019-06-25: 150 mg via INTRAMUSCULAR

## 2019-06-25 NOTE — Progress Notes (Signed)
   NURSE VISIT- INJECTION  SUBJECTIVE:  Tracy [name] is a 34 y.o. G33P0020 female here for a depo injection  . She is a GYN patient.   OBJECTIVE:  There were no vitals taken for this visit.  Appears well, in no apparent distress  Injection administered in: Right deltoid  Meds ordered this encounter  Medications  . medroxyPROGESTERone (DEPO-PROVERA) injection 150 mg    ASSESSMENT: GYN patient Depo Provera for contraception/period management PLAN: Follow-up: in 11-13 weeks for next Depo   Ladonna Snide  06/25/2019 10:32 AM

## 2019-07-06 ENCOUNTER — Other Ambulatory Visit: Payer: Self-pay | Admitting: Adult Health

## 2019-09-10 ENCOUNTER — Ambulatory Visit: Payer: 59

## 2019-09-15 ENCOUNTER — Other Ambulatory Visit: Payer: Self-pay | Admitting: Obstetrics & Gynecology

## 2019-09-15 ENCOUNTER — Other Ambulatory Visit: Payer: Self-pay | Admitting: Adult Health

## 2019-09-15 ENCOUNTER — Ambulatory Visit: Payer: 59

## 2019-09-15 DIAGNOSIS — E1021 Type 1 diabetes mellitus with diabetic nephropathy: Secondary | ICD-10-CM | POA: Diagnosis not present

## 2019-09-15 DIAGNOSIS — Z79899 Other long term (current) drug therapy: Secondary | ICD-10-CM | POA: Diagnosis not present

## 2019-09-15 DIAGNOSIS — I951 Orthostatic hypotension: Secondary | ICD-10-CM | POA: Diagnosis not present

## 2019-09-15 DIAGNOSIS — D849 Immunodeficiency, unspecified: Secondary | ICD-10-CM | POA: Diagnosis not present

## 2019-09-15 DIAGNOSIS — I10 Essential (primary) hypertension: Secondary | ICD-10-CM | POA: Diagnosis not present

## 2019-09-15 DIAGNOSIS — Z95828 Presence of other vascular implants and grafts: Secondary | ICD-10-CM | POA: Diagnosis not present

## 2019-09-15 DIAGNOSIS — F17210 Nicotine dependence, cigarettes, uncomplicated: Secondary | ICD-10-CM | POA: Diagnosis not present

## 2019-09-15 DIAGNOSIS — Z7952 Long term (current) use of systemic steroids: Secondary | ICD-10-CM | POA: Diagnosis not present

## 2019-09-15 DIAGNOSIS — Z940 Kidney transplant status: Secondary | ICD-10-CM | POA: Diagnosis not present

## 2019-09-15 DIAGNOSIS — Z3042 Encounter for surveillance of injectable contraceptive: Secondary | ICD-10-CM

## 2019-09-15 DIAGNOSIS — Z48288 Encounter for aftercare following multiple organ transplant: Secondary | ICD-10-CM | POA: Diagnosis not present

## 2019-09-16 ENCOUNTER — Other Ambulatory Visit: Payer: Self-pay

## 2019-09-16 ENCOUNTER — Ambulatory Visit (INDEPENDENT_AMBULATORY_CARE_PROVIDER_SITE_OTHER): Payer: 59 | Admitting: *Deleted

## 2019-09-16 DIAGNOSIS — Z3042 Encounter for surveillance of injectable contraceptive: Secondary | ICD-10-CM | POA: Diagnosis not present

## 2019-09-16 MED ORDER — MEDROXYPROGESTERONE ACETATE 150 MG/ML IM SUSP
150.0000 mg | Freq: Once | INTRAMUSCULAR | Status: AC
  Administered 2019-09-16: 150 mg via INTRAMUSCULAR

## 2019-09-16 NOTE — Progress Notes (Signed)
   NURSE VISIT- INJECTION  SUBJECTIVE:  Tracy [name] is a 34 y.o. G49P0020 female here for a Depo Provera for contraception/period management. She is a GYN patient.   OBJECTIVE:  There were no vitals taken for this visit.  Appears well, in no apparent distress  Injection administered in: Right deltoid  No orders of the defined types were placed in this encounter.   ASSESSMENT: GYN patient Depo Provera for contraception/period management PLAN: Follow-up: in 11-13 weeks for next Depo   Tracy [name]  09/16/2019 9:22 AM

## 2019-10-13 DIAGNOSIS — T8691 Unspecified transplanted organ and tissue rejection: Secondary | ICD-10-CM | POA: Diagnosis not present

## 2019-10-13 DIAGNOSIS — Z87440 Personal history of urinary (tract) infections: Secondary | ICD-10-CM | POA: Diagnosis not present

## 2019-10-13 DIAGNOSIS — Z940 Kidney transplant status: Secondary | ICD-10-CM | POA: Diagnosis not present

## 2019-10-13 DIAGNOSIS — Z7952 Long term (current) use of systemic steroids: Secondary | ICD-10-CM | POA: Diagnosis not present

## 2019-10-13 DIAGNOSIS — F17210 Nicotine dependence, cigarettes, uncomplicated: Secondary | ICD-10-CM | POA: Diagnosis not present

## 2019-10-13 DIAGNOSIS — E109 Type 1 diabetes mellitus without complications: Secondary | ICD-10-CM | POA: Diagnosis not present

## 2019-10-13 DIAGNOSIS — Z48288 Encounter for aftercare following multiple organ transplant: Secondary | ICD-10-CM | POA: Diagnosis not present

## 2019-10-13 DIAGNOSIS — R197 Diarrhea, unspecified: Secondary | ICD-10-CM | POA: Diagnosis not present

## 2019-10-13 DIAGNOSIS — Y830 Surgical operation with transplant of whole organ as the cause of abnormal reaction of the patient, or of later complication, without mention of misadventure at the time of the procedure: Secondary | ICD-10-CM | POA: Diagnosis not present

## 2019-10-13 DIAGNOSIS — Z792 Long term (current) use of antibiotics: Secondary | ICD-10-CM | POA: Diagnosis not present

## 2019-10-13 DIAGNOSIS — Z95828 Presence of other vascular implants and grafts: Secondary | ICD-10-CM | POA: Diagnosis not present

## 2019-10-13 DIAGNOSIS — D849 Immunodeficiency, unspecified: Secondary | ICD-10-CM | POA: Diagnosis not present

## 2019-10-13 DIAGNOSIS — T86890 Other transplanted tissue rejection: Secondary | ICD-10-CM | POA: Diagnosis not present

## 2019-10-13 DIAGNOSIS — Z79899 Other long term (current) drug therapy: Secondary | ICD-10-CM | POA: Diagnosis not present

## 2019-11-03 DIAGNOSIS — M65351 Trigger finger, right little finger: Secondary | ICD-10-CM | POA: Diagnosis not present

## 2019-11-03 DIAGNOSIS — M65331 Trigger finger, right middle finger: Secondary | ICD-10-CM | POA: Diagnosis not present

## 2019-11-10 DIAGNOSIS — Z79899 Other long term (current) drug therapy: Secondary | ICD-10-CM | POA: Diagnosis not present

## 2019-11-10 DIAGNOSIS — Y830 Surgical operation with transplant of whole organ as the cause of abnormal reaction of the patient, or of later complication, without mention of misadventure at the time of the procedure: Secondary | ICD-10-CM | POA: Diagnosis not present

## 2019-11-10 DIAGNOSIS — Z7952 Long term (current) use of systemic steroids: Secondary | ICD-10-CM | POA: Diagnosis not present

## 2019-11-10 DIAGNOSIS — Z9483 Pancreas transplant status: Secondary | ICD-10-CM | POA: Diagnosis not present

## 2019-11-10 DIAGNOSIS — T86890 Other transplanted tissue rejection: Secondary | ICD-10-CM | POA: Diagnosis not present

## 2019-11-10 DIAGNOSIS — I951 Orthostatic hypotension: Secondary | ICD-10-CM | POA: Diagnosis not present

## 2019-11-10 DIAGNOSIS — N186 End stage renal disease: Secondary | ICD-10-CM | POA: Diagnosis not present

## 2019-11-10 DIAGNOSIS — T8611 Kidney transplant rejection: Secondary | ICD-10-CM | POA: Diagnosis not present

## 2019-11-10 DIAGNOSIS — Z48288 Encounter for aftercare following multiple organ transplant: Secondary | ICD-10-CM | POA: Diagnosis not present

## 2019-11-10 DIAGNOSIS — I10 Essential (primary) hypertension: Secondary | ICD-10-CM | POA: Diagnosis not present

## 2019-11-10 DIAGNOSIS — Z940 Kidney transplant status: Secondary | ICD-10-CM | POA: Diagnosis not present

## 2019-11-10 DIAGNOSIS — E1022 Type 1 diabetes mellitus with diabetic chronic kidney disease: Secondary | ICD-10-CM | POA: Diagnosis not present

## 2019-11-10 DIAGNOSIS — N390 Urinary tract infection, site not specified: Secondary | ICD-10-CM | POA: Diagnosis not present

## 2019-11-10 DIAGNOSIS — B259 Cytomegaloviral disease, unspecified: Secondary | ICD-10-CM | POA: Diagnosis not present

## 2019-11-29 ENCOUNTER — Other Ambulatory Visit: Payer: Self-pay | Admitting: Women's Health

## 2019-11-30 ENCOUNTER — Other Ambulatory Visit: Payer: Self-pay | Admitting: Obstetrics & Gynecology

## 2019-11-30 DIAGNOSIS — Z3042 Encounter for surveillance of injectable contraceptive: Secondary | ICD-10-CM

## 2019-12-01 ENCOUNTER — Other Ambulatory Visit: Payer: Self-pay | Admitting: Women's Health

## 2019-12-02 ENCOUNTER — Other Ambulatory Visit: Payer: Self-pay

## 2019-12-02 ENCOUNTER — Ambulatory Visit (INDEPENDENT_AMBULATORY_CARE_PROVIDER_SITE_OTHER): Payer: BC Managed Care – PPO | Admitting: *Deleted

## 2019-12-02 DIAGNOSIS — Z3042 Encounter for surveillance of injectable contraceptive: Secondary | ICD-10-CM | POA: Diagnosis not present

## 2019-12-02 MED ORDER — MEDROXYPROGESTERONE ACETATE 150 MG/ML IM SUSP
150.0000 mg | Freq: Once | INTRAMUSCULAR | Status: AC
  Administered 2019-12-02: 150 mg via INTRAMUSCULAR

## 2019-12-02 NOTE — Progress Notes (Signed)
   NURSE VISIT- INJECTION  SUBJECTIVE:  Tracy [name] is a 34 y.o. G58P0020 female here for a Depo Provera for contraception/period management. She is a GYN patient.   OBJECTIVE:  There were no vitals taken for this visit.  Appears well, in no apparent distress  Injection administered in: Right deltoid  Meds ordered this encounter  Medications  . medroxyPROGESTERone (DEPO-PROVERA) injection 150 mg    ASSESSMENT: GYN patient Depo Provera for contraception/period management PLAN: Follow-up: in 11-13 weeks for next Depo   [name]  12/02/2019 9:34 AM

## 2019-12-08 DIAGNOSIS — Z87440 Personal history of urinary (tract) infections: Secondary | ICD-10-CM | POA: Diagnosis not present

## 2019-12-08 DIAGNOSIS — Z95828 Presence of other vascular implants and grafts: Secondary | ICD-10-CM | POA: Diagnosis not present

## 2019-12-08 DIAGNOSIS — Z79899 Other long term (current) drug therapy: Secondary | ICD-10-CM | POA: Diagnosis not present

## 2019-12-08 DIAGNOSIS — F17210 Nicotine dependence, cigarettes, uncomplicated: Secondary | ICD-10-CM | POA: Diagnosis not present

## 2019-12-08 DIAGNOSIS — Z9483 Pancreas transplant status: Secondary | ICD-10-CM | POA: Diagnosis not present

## 2019-12-08 DIAGNOSIS — E108 Type 1 diabetes mellitus with unspecified complications: Secondary | ICD-10-CM | POA: Diagnosis not present

## 2019-12-08 DIAGNOSIS — D84821 Immunodeficiency due to drugs: Secondary | ICD-10-CM | POA: Diagnosis not present

## 2019-12-08 DIAGNOSIS — Z23 Encounter for immunization: Secondary | ICD-10-CM | POA: Diagnosis not present

## 2019-12-08 DIAGNOSIS — Z7952 Long term (current) use of systemic steroids: Secondary | ICD-10-CM | POA: Diagnosis not present

## 2019-12-08 DIAGNOSIS — Z940 Kidney transplant status: Secondary | ICD-10-CM | POA: Diagnosis not present

## 2019-12-08 DIAGNOSIS — Z48288 Encounter for aftercare following multiple organ transplant: Secondary | ICD-10-CM | POA: Diagnosis not present

## 2019-12-09 ENCOUNTER — Ambulatory Visit: Payer: 59

## 2019-12-23 DIAGNOSIS — M65351 Trigger finger, right little finger: Secondary | ICD-10-CM | POA: Diagnosis not present

## 2019-12-23 DIAGNOSIS — M65331 Trigger finger, right middle finger: Secondary | ICD-10-CM | POA: Diagnosis not present

## 2020-01-04 DIAGNOSIS — M25641 Stiffness of right hand, not elsewhere classified: Secondary | ICD-10-CM | POA: Diagnosis not present

## 2020-01-05 DIAGNOSIS — Z79899 Other long term (current) drug therapy: Secondary | ICD-10-CM | POA: Diagnosis not present

## 2020-01-05 DIAGNOSIS — Z48288 Encounter for aftercare following multiple organ transplant: Secondary | ICD-10-CM | POA: Diagnosis not present

## 2020-01-10 ENCOUNTER — Other Ambulatory Visit: Payer: Self-pay | Admitting: Adult Health

## 2020-01-10 ENCOUNTER — Telehealth: Payer: Self-pay | Admitting: Adult Health

## 2020-01-10 NOTE — Telephone Encounter (Signed)
Pt on depo shot & is bleeding.  History of organ transplant.  States we have ordered meds for her in the past to stop the bleeding & she would like a Rx sent in to [facility]  Please advise & notify pt

## 2020-01-17 MED ORDER — MEGESTROL ACETATE 40 MG PO TABS: ORAL_TABLET | ORAL | 0 refills | Status: DC

## 2020-01-17 NOTE — Addendum Note (Signed)
Addended by: Derrek Monaco A on: 01/17/2020 05:17 PM   Modules accepted: Orders

## 2020-01-17 NOTE — Telephone Encounter (Signed)
Pt is scheduled for 02/29/2020 for pap/physical with Anderson Malta

## 2020-01-17 NOTE — Telephone Encounter (Signed)
Left message that megace refilled, and I see appt in January

## 2020-02-02 DIAGNOSIS — Z48288 Encounter for aftercare following multiple organ transplant: Secondary | ICD-10-CM | POA: Diagnosis not present

## 2020-02-02 DIAGNOSIS — Z79899 Other long term (current) drug therapy: Secondary | ICD-10-CM | POA: Diagnosis not present

## 2020-02-12 ENCOUNTER — Other Ambulatory Visit: Payer: Self-pay | Admitting: Adult Health

## 2020-02-17 ENCOUNTER — Encounter: Payer: Self-pay | Admitting: *Deleted

## 2020-02-17 ENCOUNTER — Ambulatory Visit (INDEPENDENT_AMBULATORY_CARE_PROVIDER_SITE_OTHER): Payer: BC Managed Care – PPO | Admitting: *Deleted

## 2020-02-17 ENCOUNTER — Other Ambulatory Visit: Payer: Self-pay

## 2020-02-17 DIAGNOSIS — Z308 Encounter for other contraceptive management: Secondary | ICD-10-CM

## 2020-02-17 MED ORDER — MEDROXYPROGESTERONE ACETATE 150 MG/ML IM SUSP
150.0000 mg | Freq: Once | INTRAMUSCULAR | Status: AC
  Administered 2020-02-17: 150 mg via INTRAMUSCULAR

## 2020-02-17 NOTE — Progress Notes (Signed)
   NURSE VISIT- INJECTION  SUBJECTIVE:  Tracy [name] is a 35 y.o. G22P0020 female here for a Depo Provera for contraception/period management. She is a GYN patient.   OBJECTIVE:  There were no vitals taken for this visit.  Appears well, in no apparent distress  Injection administered in: Right deltoid  Meds ordered this encounter  Medications  . medroxyPROGESTERone (DEPO-PROVERA) injection 150 mg    ASSESSMENT: GYN patient Depo Provera for contraception/period management PLAN: Follow-up: in 11-13 weeks for next Depo   Levy Pupa  02/17/2020 9:45 AM

## 2020-02-29 ENCOUNTER — Other Ambulatory Visit: Payer: BC Managed Care – PPO | Admitting: Adult Health

## 2020-02-29 ENCOUNTER — Telehealth: Payer: Self-pay | Admitting: Adult Health

## 2020-02-29 NOTE — Telephone Encounter (Signed)
Pt needs refills on Trazodone, Lorazepam and Diflucan. Just enough until her physical in March. Thanks!! [place]

## 2020-02-29 NOTE — Telephone Encounter (Signed)
Patient wants a refill on Trazodone '50mg'$  & Lorazepam 1 mg. Clinical staff will follow up with patient.

## 2020-03-01 DIAGNOSIS — Z940 Kidney transplant status: Secondary | ICD-10-CM | POA: Diagnosis not present

## 2020-03-01 DIAGNOSIS — F17210 Nicotine dependence, cigarettes, uncomplicated: Secondary | ICD-10-CM | POA: Diagnosis not present

## 2020-03-01 DIAGNOSIS — Z7952 Long term (current) use of systemic steroids: Secondary | ICD-10-CM | POA: Diagnosis not present

## 2020-03-01 DIAGNOSIS — Z79899 Other long term (current) drug therapy: Secondary | ICD-10-CM | POA: Diagnosis not present

## 2020-03-01 DIAGNOSIS — Z48288 Encounter for aftercare following multiple organ transplant: Secondary | ICD-10-CM | POA: Diagnosis not present

## 2020-03-01 DIAGNOSIS — N390 Urinary tract infection, site not specified: Secondary | ICD-10-CM | POA: Diagnosis not present

## 2020-03-01 DIAGNOSIS — D849 Immunodeficiency, unspecified: Secondary | ICD-10-CM | POA: Diagnosis not present

## 2020-03-01 MED ORDER — LORAZEPAM 1 MG PO TABS: ORAL_TABLET | ORAL | 0 refills | Status: DC

## 2020-03-01 MED ORDER — FLUCONAZOLE 150 MG PO TABS: ORAL_TABLET | ORAL | 1 refills | Status: DC

## 2020-03-01 MED ORDER — TRAZODONE HCL 50 MG PO TABS: 100.0000 mg | ORAL_TABLET | Freq: Every day | ORAL | 1 refills | Status: DC

## 2020-03-01 NOTE — Telephone Encounter (Signed)
Will refill meds til appt in march

## 2020-03-09 ENCOUNTER — Other Ambulatory Visit: Payer: Self-pay | Admitting: Adult Health

## 2020-03-09 DIAGNOSIS — H2513 Age-related nuclear cataract, bilateral: Secondary | ICD-10-CM | POA: Diagnosis not present

## 2020-03-09 DIAGNOSIS — H5213 Myopia, bilateral: Secondary | ICD-10-CM | POA: Diagnosis not present

## 2020-03-13 ENCOUNTER — Other Ambulatory Visit: Payer: Self-pay | Admitting: Adult Health

## 2020-03-29 DIAGNOSIS — Z48288 Encounter for aftercare following multiple organ transplant: Secondary | ICD-10-CM | POA: Diagnosis not present

## 2020-03-29 DIAGNOSIS — Z940 Kidney transplant status: Secondary | ICD-10-CM | POA: Diagnosis not present

## 2020-03-29 DIAGNOSIS — Z9483 Pancreas transplant status: Secondary | ICD-10-CM | POA: Diagnosis not present

## 2020-03-29 DIAGNOSIS — Z79899 Other long term (current) drug therapy: Secondary | ICD-10-CM | POA: Diagnosis not present

## 2020-04-19 ENCOUNTER — Other Ambulatory Visit: Payer: Self-pay

## 2020-04-19 ENCOUNTER — Ambulatory Visit (INDEPENDENT_AMBULATORY_CARE_PROVIDER_SITE_OTHER): Payer: BC Managed Care – PPO | Admitting: Adult Health

## 2020-04-19 ENCOUNTER — Encounter: Payer: Self-pay | Admitting: Adult Health

## 2020-04-19 ENCOUNTER — Other Ambulatory Visit (HOSPITAL_COMMUNITY)
Admission: RE | Admit: 2020-04-19 | Discharge: 2020-04-19 | Disposition: A | Discharge status: Home / Self Care | Payer: BC Managed Care – PPO | Source: Ambulatory Visit | Attending: Adult Health | Admitting: Adult Health

## 2020-04-19 VITALS — BP 144/84 | HR 74 | Ht 63.0 in | Wt 165.0 lb

## 2020-04-19 DIAGNOSIS — Z01419 Encounter for gynecological examination (general) (routine) without abnormal findings: Secondary | ICD-10-CM | POA: Diagnosis not present

## 2020-04-19 DIAGNOSIS — Z8742 Personal history of other diseases of the female genital tract: Secondary | ICD-10-CM

## 2020-04-19 DIAGNOSIS — Z3042 Encounter for surveillance of injectable contraceptive: Secondary | ICD-10-CM

## 2020-04-19 DIAGNOSIS — N939 Abnormal uterine and vaginal bleeding, unspecified: Secondary | ICD-10-CM | POA: Insufficient documentation

## 2020-04-19 DIAGNOSIS — F419 Anxiety disorder, unspecified: Secondary | ICD-10-CM

## 2020-04-19 DIAGNOSIS — Z949 Transplanted organ and tissue status, unspecified: Secondary | ICD-10-CM

## 2020-04-19 NOTE — Progress Notes (Signed)
Patient ID: Tracy [name], female   DOB: 07/16/1985, 35 y.o.   MRN: [id number] History of Present Illness: Tracy [name] is a 35 year old white female, married, G2P0020, in for a well woman gyn exam and pap. She is on depo and having bleeding that megace has not helped.  She had an organ transplant(renal and pancreas) many years ago and is doing well, is seen at [facility]. She had colpo 01/23/18 no visible dysplasia, last pap was LSIL+HPV 10/06/17. Has +anxiety. She is having cataract surgery next week.  Current Medications, Allergies, Past Medical History, Past Surgical History, Family History and Social History were reviewed in [employer] record.     Review of Systems: Patient denies any headaches, hearing loss, fatigue, blurred vision, shortness of breath, chest pain, abdominal pain, problems with bowel movements, urination, or intercourse. No joint pain or mood swings. See HPI for positives     Physical Exam:BP (!) 144/84 (BP Location: Right Arm, Patient Position: Sitting, Cuff Size: Normal)   Pulse 74   Ht '5\' 3"'$  (1.6 m)   Wt 165 lb (74.8 kg)   BMI 29.23 kg/m  General:  Well developed, well nourished, no acute distress Skin:  Warm and dry Neck:  Midline trachea, normal thyroid, good ROM, no lymphadenopathy Lungs; Clear to auscultation bilaterally Breast:  No dominant palpable mass, retraction, or nipple discharge,has port a cath near right clavicle  Cardiovascular: Regular rate and rhythm Abdomen:  Soft, non tender, no hepatosplenomegaly Pelvic:  External genitalia is normal in appearance, no lesions.  The vagina is normal in appearance. Urethra has no lesions or masses. The cervix is smooth, pap with HRHPV genotyping performed.  Uterus is felt to be normal size, shape, and contour.  No adnexal masses or tenderness noted.Bladder is non tender, no masses felt. Extremities/musculoskeletal:  No swelling or varicosities noted, no clubbing or cyanosis Psych:  No mood  changes, alert and cooperative,seems happy AA is 1 Fall risk is low PHQ 9 score is 2 GAD 7 score is 7  [facility] - 04/19/20 1503      Pregnancy Intention Screening   Does the patient want to become pregnant in the next year? No    Does the patient's partner want to become pregnant in the next year? No    Would the patient like to discuss contraceptive options today? No      Contraception Wrap Up   Current Method Hormonal Injection    End Method Hormonal Injection    Contraception Counseling Provided No         Examination chaperoned by Celene Squibb LPN  Impression and Plan: 1. Encounter for gynecological examination with Papanicolaou smear of cervix Pap sent Physical in 1 year Pap in 3 if normal Labs at [facility]   2. Encounter for surveillance of injectable contraceptive Has refills on depo  3. History of abnormal cervical Pap smear Pap sent   4. Anxiety Has ativan prn   5. History of organ transplantation Follow up at [place]. Abnormal uterine bleeding (AUB) Will get GYN [place]  Review handout on endometrial ablation(she does not want children)

## 2020-04-24 ENCOUNTER — Other Ambulatory Visit: Payer: Self-pay

## 2020-04-24 ENCOUNTER — Ambulatory Visit (INDEPENDENT_AMBULATORY_CARE_PROVIDER_SITE_OTHER): Payer: BC Managed Care – PPO

## 2020-04-24 DIAGNOSIS — N939 Abnormal uterine and vaginal bleeding, unspecified: Secondary | ICD-10-CM

## 2020-04-24 NOTE — Progress Notes (Signed)
PELVIC US TA/TV:normal homogeneous anteverted uterus,EEC 11.3 mm,normal ovaries,ovaries appear mobile,no free fluid,no pain during ultrasound

## 2020-04-25 DIAGNOSIS — D849 Immunodeficiency, unspecified: Secondary | ICD-10-CM | POA: Diagnosis not present

## 2020-04-25 DIAGNOSIS — Z79899 Other long term (current) drug therapy: Secondary | ICD-10-CM | POA: Diagnosis not present

## 2020-04-25 DIAGNOSIS — Z9483 Pancreas transplant status: Secondary | ICD-10-CM | POA: Diagnosis not present

## 2020-04-25 DIAGNOSIS — Z940 Kidney transplant status: Secondary | ICD-10-CM | POA: Diagnosis not present

## 2020-04-26 DIAGNOSIS — H2512 Age-related nuclear cataract, left eye: Secondary | ICD-10-CM | POA: Diagnosis not present

## 2020-04-26 DIAGNOSIS — H25812 Combined forms of age-related cataract, left eye: Secondary | ICD-10-CM | POA: Diagnosis not present

## 2020-04-27 LAB — CYTOLOGY - PAP
Comment: NEGATIVE
Comment: NEGATIVE
Diagnosis: UNDETERMINED — AB
HPV 16: POSITIVE — AB
HPV 18 / 45: NEGATIVE
High risk HPV: POSITIVE — AB

## 2020-05-01 ENCOUNTER — Telehealth: Payer: Self-pay | Admitting: Adult Health

## 2020-05-01 ENCOUNTER — Encounter: Payer: Self-pay | Admitting: Adult Health

## 2020-05-01 DIAGNOSIS — R87620 Atypical squamous cells of undetermined significance on cytologic smear of vagina (ASC-US): Secondary | ICD-10-CM | POA: Insufficient documentation

## 2020-05-01 HISTORY — DX: Atypical squamous cells of undetermined significance on cytologic smear of vagina (ASC-US): R87.620

## 2020-05-01 NOTE — Telephone Encounter (Signed)
Pt aware that pap was ASCUS with +HPV 16 and other, will get colpo, with Dr Nelda Marseille, as she also is thinking about ablation, but get colpo first

## 2020-05-11 ENCOUNTER — Ambulatory Visit: Payer: BC Managed Care – PPO

## 2020-05-15 ENCOUNTER — Ambulatory Visit (INDEPENDENT_AMBULATORY_CARE_PROVIDER_SITE_OTHER): Payer: BC Managed Care – PPO | Admitting: *Deleted

## 2020-05-15 ENCOUNTER — Other Ambulatory Visit: Payer: Self-pay

## 2020-05-15 DIAGNOSIS — Z308 Encounter for other contraceptive management: Secondary | ICD-10-CM | POA: Diagnosis not present

## 2020-05-15 MED ORDER — MEDROXYPROGESTERONE ACETATE 150 MG/ML IM SUSP
150.0000 mg | Freq: Once | INTRAMUSCULAR | Status: AC
  Administered 2020-05-15: 150 mg via INTRAMUSCULAR

## 2020-05-15 NOTE — Progress Notes (Signed)
   NURSE VISIT- INJECTION  SUBJECTIVE:  Tracy [name] is a 35 y.o. G79P0020 female here for a Depo Provera for contraception/period management. She is a GYN patient.   OBJECTIVE:  There were no vitals taken for this visit.  Appears well, in no apparent distress  Injection administered in: Right deltoid  Meds ordered this encounter  Medications  . medroxyPROGESTERone (DEPO-PROVERA) injection 150 mg    ASSESSMENT: GYN patient Depo Provera for contraception/period management PLAN: Follow-up: in 11-13 weeks for next Depo   Levy Pupa  05/15/2020 9:20 AM

## 2020-05-24 DIAGNOSIS — Z48288 Encounter for aftercare following multiple organ transplant: Secondary | ICD-10-CM | POA: Diagnosis not present

## 2020-05-24 DIAGNOSIS — Z79899 Other long term (current) drug therapy: Secondary | ICD-10-CM | POA: Diagnosis not present

## 2020-05-24 DIAGNOSIS — T86890 Other transplanted tissue rejection: Secondary | ICD-10-CM | POA: Diagnosis not present

## 2020-05-24 DIAGNOSIS — Y830 Surgical operation with transplant of whole organ as the cause of abnormal reaction of the patient, or of later complication, without mention of misadventure at the time of the procedure: Secondary | ICD-10-CM | POA: Diagnosis not present

## 2020-05-24 DIAGNOSIS — Z940 Kidney transplant status: Secondary | ICD-10-CM | POA: Diagnosis not present

## 2020-05-24 DIAGNOSIS — Z9483 Pancreas transplant status: Secondary | ICD-10-CM | POA: Diagnosis not present

## 2020-05-27 NOTE — Progress Notes (Signed)
GYN VISIT Patient name: Tracy [name] MRN [id number]  Date of birth: 23-Nov-1985 Chief Complaint:   Colposcopy (ASCUS pap; +HR HPV; + HPV 16)  History of Present Illness:   Tracy [name] is a 35 y.o. G68P0020 female being seen today for several concerns:   1)  Cervical dysplasia- Pap on 3/22: ASCUS, HPV 16 positive -Prior pap 01/2018- LSIL, HPV+ Pt reports h/o colposcopy in the past.  She denies post-coital bleeding.  She presents today for colposcopy  2) Abnormal uterine bleeding- This continues to be an ongoing issue. Currently on Depo for contraception, which has not helped to regulate her period. Menses are moderate to heavy with passage of clots.  Additionally the bleeding is irregular- she has to wear a pad all the time.  Previoulsy discussed with Derrek Monaco- started on Megace- On/off Megace for close to a year.  The Megace does help somewhat, but she would prefer to explore alternative options.  Prior US reviewed: 04/24/20: 5.7cm normal homogeneous anteverted uterus, normal ovaries,ovaries appear mobile,no free fluid  Decreased libido/dyspareunia: She notes that she is just "over it." Not really that interested, plus she is always bleeding and notes some discomfort.  Pt works with livestock- very busy lifestyle.  Does not desire pregnancy in the future- due to h/o transplant she realizes she would be very high risk and does not want to the risk.   Depression screen [facility] 2/9 04/19/2020 10/06/2017 01/16/2017 08/30/2016 01/18/2016  Decreased Interest 0 0 '1 2 1  '$ Down, Depressed, Hopeless 0 '1 1 1 1  '$ PHQ - 2 Score 0 '1 2 3 2  '$ Altered sleeping 1 - '1 1 1  '$ Tired, decreased energy 1 - '1 1 1  '$ Change in appetite 0 - 0 1 2  Feeling bad or failure about yourself  0 - '1 1 1  '$ Trouble concentrating 0 - 0 1 1  Moving slowly or fidgety/restless 0 - 0 0 0  Suicidal thoughts 0 - 0 1 0  PHQ-9 Score 2 - '5 9 8  '$ Difficult doing work/chores - - - - -  Some recent data might be hidden      Review of Systems:   Pertinent items are noted in HPI Denies fever/chills, dizziness, headaches, visual disturbances, fatigue, shortness of breath, chest pain, abdominal pain, vomiting, bowel movements, urination. Pertinent History Reviewed:  Reviewed past medical,surgical, social, obstetrical and family history.  Reviewed problem list, medications and allergies. Physical Assessment:   Vitals:   05/31/20 0856  BP: (!) 149/79  Pulse: 75  Weight: 165 lb 8 oz (75.1 kg)  Height: '5\' 2"'$  (1.575 m)  Body mass index is 30.27 kg/m.       Physical Examination:   General appearance: alert, well appearing, and in no distress  Psych: mood appropriate, normal affect  Skin: warm & dry   Cardiovascular: normal heart rate noted  Respiratory: normal respiratory effort, no distress  Abdomen: soft, non-tender, midline incision noted.  Per patient- transplant kidney in RLQ  Pelvic: VULVA: normal appearing vulva with no masses, tenderness or lesions, VAGINA: normal appearing vagina with normal color and discharge, no lesions, CERVIX: normal appearing cervix without discharge or lesions- see colposcopy section, UTERUS: uterus is normal size, shape, consistency and nontender  Extremities: no edema   Chaperone: Angel Neas    COLPOSCOPY and EMB PROCEDURE The risks and benefits of the procedure and Written informed consent obtained.    Findings: Adequate colposcopy is noted today.  no visible lesions  Procedure: Speculum placed in vagina and excellent visualization of cervix achieved, cervix swabbed x 2 with acetic acid solution.ECC and biopsy taken at 11 and 6 o'clock.  Cervix then prepped with betadine.  Single tooth tenaculum placed.  Pipette used to obtain EMB.  Hemostasis obtained with Monsels.  All instruments were then removed. Specimens: ECC, cervical biopsy x2, EMB  Complications: none.  Colposcopic Impression: negative   Plan(Based on 2019 ASCCP recommendations) Specimens labelled  and sent to Pathology.   PLAN: -Cervical dysplasia: post-instructions given.  Further management pending results  -AUB:  Discussed all management options- reviewed risk/benefit of each option.  Best options include Mirena as this would regulate menses and continue with contraception. Pt most interested in endometrial ablation - reviewed pros/cons and discussed recovery as well.  Also discussed hysterectomy; however, due to prior surgery concern for increased risk due to adhesions as well as location of transplanted kidney.   After much discussion- leaning towards ablation.  Reviewed that she would either have to continue with Depo or partner may also consider vasectomy -Could also consider IUD placement at time of ablation '[]'$  to review at next visit.  Referral note to Daisy to schedule with preop office visit once scheduled  Janyth Pupa, DO Attending [place], [facility] for [facility], [place]

## 2020-05-31 ENCOUNTER — Other Ambulatory Visit: Payer: Self-pay

## 2020-05-31 ENCOUNTER — Encounter: Payer: Self-pay | Admitting: Obstetrics & Gynecology

## 2020-05-31 ENCOUNTER — Ambulatory Visit (INDEPENDENT_AMBULATORY_CARE_PROVIDER_SITE_OTHER): Payer: BC Managed Care – PPO | Admitting: Obstetrics & Gynecology

## 2020-05-31 VITALS — BP 149/79 | HR 75 | Ht 62.0 in | Wt 165.5 lb

## 2020-05-31 DIAGNOSIS — N870 Mild cervical dysplasia: Secondary | ICD-10-CM | POA: Diagnosis not present

## 2020-05-31 DIAGNOSIS — Z3202 Encounter for pregnancy test, result negative: Secondary | ICD-10-CM | POA: Diagnosis not present

## 2020-05-31 DIAGNOSIS — R87810 Cervical high risk human papillomavirus (HPV) DNA test positive: Secondary | ICD-10-CM | POA: Diagnosis not present

## 2020-05-31 DIAGNOSIS — R87610 Atypical squamous cells of undetermined significance on cytologic smear of cervix (ASC-US): Secondary | ICD-10-CM | POA: Diagnosis not present

## 2020-05-31 DIAGNOSIS — N939 Abnormal uterine and vaginal bleeding, unspecified: Secondary | ICD-10-CM | POA: Diagnosis not present

## 2020-05-31 LAB — POCT URINE PREGNANCY: Preg Test, Ur: NEGATIVE

## 2020-05-31 NOTE — Patient Instructions (Signed)
Colposcopy, Care After This sheet gives you information about how to care for yourself after your procedure. Your doctor may also give you more specific instructions. If you have problems or questions, contact your doctor. What can I expect after the procedure? If you did not have a sample of your tissue taken out (did not have a biopsy), you may only have some spotting of blood for a few days. You can go back to your normal activities. If you had a sample of your tissue taken out, it is common to have:  Soreness and mild pain. These may last for a few days.  A light-headed feeling.  Mild bleeding or fluid (discharge) coming from your vagina. The fluid will look dark and grainy. You may have this for a few days. The fluid may be caused by a liquid that was used during your procedure. You may need to wear a sanitary pad.  Spotting of blood for at least 48 hours after the procedure. Follow these instructions at home: Medicines  For pain- take either ibuprofen (with food) or tylenol  Ask your doctor what medicines you can start taking again. This is very important if you take blood thinners. Activity  Limit your activity for the first day after your procedure as told by your doctor.  For at least 7 days, or for as long as told by your doctor, avoid: ? Douching. ? Using tampons. ? Having sex.  Return to your normal activities as told by your doctor. Ask your doctor what activities are safe for you. General instructions  Drink enough fluid to keep your pee (urine) pale yellow.  Ask your doctor if you may take baths, swim, or use a hot tub. You may take showers.  If you use birth control (contraception), keep using it.  Keep all follow-up visits as told by your doctor. This is important.   Contact a doctor if:  You get a skin rash. Get help right away if:  You bleed a lot from your vagina. A lot of bleeding means you use more than one pad an hour for 2 hours in a row.  You have  clumps of blood (blood clots) coming from your vagina.  You have a fever or chills.  You have signs of infection. This may be fluid coming from your vagina that is: ? Different than normal. ? Yellow. ? Bad-smelling.  You have very bad pain or cramps in your lower belly that do not get better with medicine.  You faint. Summary  If you did not have a sample of your tissue taken out, you may only have some spotting of blood for a few days. You can go back to your normal activities.  If you had a sample of your tissue taken out, it is common to have mild pain for a few days and spotting for 48 hours.  Avoid douching, using tampons, and having sex for at least 3 days after the procedure or for as long as told.  Get help right away if you have a lot of bleeding, very bad pain, or signs of infection. This information is not intended to replace advice given to you by your health care provider. Make sure you discuss any questions you have with your health care provider. Document Revised: 11/30/2019 Document Reviewed: 01/27/2019 Elsevier Patient Education  2021 [employer].

## 2020-06-02 DIAGNOSIS — J0100 Acute maxillary sinusitis, unspecified: Secondary | ICD-10-CM | POA: Diagnosis not present

## 2020-06-02 DIAGNOSIS — J309 Allergic rhinitis, unspecified: Secondary | ICD-10-CM | POA: Diagnosis not present

## 2020-06-22 DIAGNOSIS — Z48288 Encounter for aftercare following multiple organ transplant: Secondary | ICD-10-CM | POA: Diagnosis not present

## 2020-06-22 DIAGNOSIS — Z9483 Pancreas transplant status: Secondary | ICD-10-CM | POA: Diagnosis not present

## 2020-06-22 DIAGNOSIS — Z940 Kidney transplant status: Secondary | ICD-10-CM | POA: Diagnosis not present

## 2020-06-22 DIAGNOSIS — Z95828 Presence of other vascular implants and grafts: Secondary | ICD-10-CM | POA: Diagnosis not present

## 2020-06-22 DIAGNOSIS — T86890 Other transplanted tissue rejection: Secondary | ICD-10-CM | POA: Diagnosis not present

## 2020-06-22 DIAGNOSIS — Y830 Surgical operation with transplant of whole organ as the cause of abnormal reaction of the patient, or of later complication, without mention of misadventure at the time of the procedure: Secondary | ICD-10-CM | POA: Diagnosis not present

## 2020-07-04 DIAGNOSIS — D849 Immunodeficiency, unspecified: Secondary | ICD-10-CM | POA: Diagnosis not present

## 2020-07-04 DIAGNOSIS — Z9483 Pancreas transplant status: Secondary | ICD-10-CM | POA: Diagnosis not present

## 2020-07-04 DIAGNOSIS — T8619 Other complication of kidney transplant: Secondary | ICD-10-CM | POA: Diagnosis not present

## 2020-07-04 DIAGNOSIS — Z4822 Encounter for aftercare following kidney transplant: Secondary | ICD-10-CM | POA: Diagnosis not present

## 2020-07-04 DIAGNOSIS — Z940 Kidney transplant status: Secondary | ICD-10-CM | POA: Diagnosis not present

## 2020-07-04 DIAGNOSIS — Z5181 Encounter for therapeutic drug level monitoring: Secondary | ICD-10-CM | POA: Diagnosis not present

## 2020-07-04 DIAGNOSIS — Z79899 Other long term (current) drug therapy: Secondary | ICD-10-CM | POA: Diagnosis not present

## 2020-07-14 ENCOUNTER — Other Ambulatory Visit: Payer: Self-pay

## 2020-07-14 ENCOUNTER — Ambulatory Visit (HOSPITAL_COMMUNITY)
Admission: RE | Admit: 2020-07-14 | Discharge: 2020-07-14 | Disposition: A | Discharge status: Home / Self Care | Payer: BC Managed Care – PPO | Source: Ambulatory Visit | Attending: Physician Assistant | Admitting: Physician Assistant

## 2020-07-14 ENCOUNTER — Other Ambulatory Visit (HOSPITAL_COMMUNITY): Payer: Self-pay | Admitting: Physician Assistant

## 2020-07-14 ENCOUNTER — Other Ambulatory Visit: Payer: Self-pay | Admitting: Physician Assistant

## 2020-07-14 DIAGNOSIS — S92302A Fracture of unspecified metatarsal bone(s), left foot, initial encounter for closed fracture: Secondary | ICD-10-CM | POA: Diagnosis not present

## 2020-07-14 DIAGNOSIS — S92902A Unspecified fracture of left foot, initial encounter for closed fracture: Secondary | ICD-10-CM | POA: Diagnosis not present

## 2020-07-14 DIAGNOSIS — S92355A Nondisplaced fracture of fifth metatarsal bone, left foot, initial encounter for closed fracture: Secondary | ICD-10-CM | POA: Diagnosis not present

## 2020-07-14 DIAGNOSIS — S92322A Displaced fracture of second metatarsal bone, left foot, initial encounter for closed fracture: Secondary | ICD-10-CM | POA: Diagnosis not present

## 2020-07-14 DIAGNOSIS — Z6829 Body mass index (BMI) 29.0-29.9, adult: Secondary | ICD-10-CM | POA: Diagnosis not present

## 2020-07-14 DIAGNOSIS — S92312A Displaced fracture of first metatarsal bone, left foot, initial encounter for closed fracture: Secondary | ICD-10-CM | POA: Diagnosis not present

## 2020-07-19 DIAGNOSIS — Z95828 Presence of other vascular implants and grafts: Secondary | ICD-10-CM | POA: Diagnosis not present

## 2020-07-19 DIAGNOSIS — Z48288 Encounter for aftercare following multiple organ transplant: Secondary | ICD-10-CM | POA: Diagnosis not present

## 2020-07-19 DIAGNOSIS — Z9483 Pancreas transplant status: Secondary | ICD-10-CM | POA: Diagnosis not present

## 2020-07-19 DIAGNOSIS — S92331A Displaced fracture of third metatarsal bone, right foot, initial encounter for closed fracture: Secondary | ICD-10-CM | POA: Diagnosis not present

## 2020-07-19 DIAGNOSIS — Y830 Surgical operation with transplant of whole organ as the cause of abnormal reaction of the patient, or of later complication, without mention of misadventure at the time of the procedure: Secondary | ICD-10-CM | POA: Diagnosis not present

## 2020-07-19 DIAGNOSIS — Z940 Kidney transplant status: Secondary | ICD-10-CM | POA: Diagnosis not present

## 2020-07-19 DIAGNOSIS — S92321A Displaced fracture of second metatarsal bone, right foot, initial encounter for closed fracture: Secondary | ICD-10-CM | POA: Diagnosis not present

## 2020-07-19 DIAGNOSIS — T86890 Other transplanted tissue rejection: Secondary | ICD-10-CM | POA: Diagnosis not present

## 2020-07-19 DIAGNOSIS — S92341A Displaced fracture of fourth metatarsal bone, right foot, initial encounter for closed fracture: Secondary | ICD-10-CM | POA: Diagnosis not present

## 2020-07-19 DIAGNOSIS — S92312A Displaced fracture of first metatarsal bone, left foot, initial encounter for closed fracture: Secondary | ICD-10-CM | POA: Diagnosis not present

## 2020-07-26 DIAGNOSIS — S92322D Displaced fracture of second metatarsal bone, left foot, subsequent encounter for fracture with routine healing: Secondary | ICD-10-CM | POA: Diagnosis not present

## 2020-07-26 DIAGNOSIS — S92332D Displaced fracture of third metatarsal bone, left foot, subsequent encounter for fracture with routine healing: Secondary | ICD-10-CM | POA: Diagnosis not present

## 2020-07-26 DIAGNOSIS — S92342D Displaced fracture of fourth metatarsal bone, left foot, subsequent encounter for fracture with routine healing: Secondary | ICD-10-CM | POA: Diagnosis not present

## 2020-07-26 DIAGNOSIS — S92312D Displaced fracture of first metatarsal bone, left foot, subsequent encounter for fracture with routine healing: Secondary | ICD-10-CM | POA: Diagnosis not present

## 2020-07-27 ENCOUNTER — Encounter (HOSPITAL_COMMUNITY): Payer: Self-pay | Admitting: Orthopedic Surgery

## 2020-07-27 ENCOUNTER — Other Ambulatory Visit: Payer: Self-pay

## 2020-07-27 NOTE — Progress Notes (Addendum)
PCP - denies - will go to near by urgent care if she needs to, does not see any PCP.  Cardiologist - denies EKG - DOS Chest x-ray - n/a ECHO - n/a Cardiac Cath - n/a CPAP - n/a   Per pt, she used to be diabetic type 1, but she got a kidney and pancreas transplant in 2017 and is no longer diabetic and does not have an insulin pump anymore. Only take anti-rejection meds for transplant.   Pt has L AV fistula from dialysis prior to transplant. Cataio placed port in pt d/t pt being such a hard stick. Per pt, Dr. Marcelino Scot suggested accessing port for IV access on DOS  COVID TEST- n/a  Anesthesia review: n/a  -------------  SDW INSTRUCTIONS:  Your procedure is scheduled on 6/17 Friday. Please report to [facility] Main Entrance "A" at 0530 A.M., and check in at the Admitting office. Call this number if you have problems the morning of surgery: [phone]   Remember: Do not eat or drink after midnight the night before your surgery   Medications to take morning of surgery with a sip of water include: mycophenolate (MYFORTIC) tacrolimus (PROGRAF) predniSONE (DELTASONE)  As of today, STOP taking any Aspirin (unless otherwise instructed by your surgeon), Aleve, Naproxen, Ibuprofen, Motrin, Advil, Goody's, BC's, all herbal medications, fish oil, and all vitamins.    The Morning of Surgery Do not wear jewelry, make-up or nail polish. Do not wear lotions, powders, or perfumes or deodorant Do not shave 48 hours prior to surgery.   Do not bring valuables to the hospital. [facility] is not responsible for any belongings or valuables.  If you are a smoker, DO NOT Smoke 24 hours prior to surgery If you wear a CPAP at night please bring your mask the morning of surgery  Remember that you must have someone to transport you home after your surgery, and remain with you for 24 hours if you are discharged the same day.  Please bring cases for contacts, glasses, hearing aids, dentures  or bridgework because it cannot be worn into surgery.   Patients discharged the day of surgery will not be allowed to drive home.   Please shower the NIGHT BEFORE/MORNING OF SURGERY (use antibacterial soap like DIAL soap if possible). Wear comfortable clothes the morning of surgery. Oral Hygiene is also important to reduce your risk of infection.  Remember - BRUSH YOUR TEETH THE MORNING OF SURGERY WITH YOUR REGULAR TOOTHPASTE  Patient denies shortness of breath, fever, cough and chest pain.

## 2020-07-28 ENCOUNTER — Ambulatory Visit (HOSPITAL_COMMUNITY): Payer: BC Managed Care – PPO

## 2020-07-28 ENCOUNTER — Encounter (HOSPITAL_COMMUNITY)
Admission: RE | Disposition: A | Discharge status: Home / Self Care | Payer: Self-pay | Source: Home / Self Care | Attending: Orthopedic Surgery

## 2020-07-28 ENCOUNTER — Encounter (HOSPITAL_COMMUNITY): Payer: Self-pay | Admitting: Orthopedic Surgery

## 2020-07-28 ENCOUNTER — Ambulatory Visit (HOSPITAL_COMMUNITY): Payer: BC Managed Care – PPO | Admitting: Certified Registered"

## 2020-07-28 ENCOUNTER — Ambulatory Visit (HOSPITAL_COMMUNITY)
Admission: RE | Admit: 2020-07-28 | Discharge: 2020-07-28 | Disposition: A | Discharge status: Home / Self Care | Payer: BC Managed Care – PPO | Attending: Orthopedic Surgery | Admitting: Orthopedic Surgery

## 2020-07-28 DIAGNOSIS — Z793 Long term (current) use of hormonal contraceptives: Secondary | ICD-10-CM | POA: Diagnosis not present

## 2020-07-28 DIAGNOSIS — Z9483 Pancreas transplant status: Secondary | ICD-10-CM | POA: Insufficient documentation

## 2020-07-28 DIAGNOSIS — S92312A Displaced fracture of first metatarsal bone, left foot, initial encounter for closed fracture: Secondary | ICD-10-CM | POA: Insufficient documentation

## 2020-07-28 DIAGNOSIS — Z992 Dependence on renal dialysis: Secondary | ICD-10-CM | POA: Insufficient documentation

## 2020-07-28 DIAGNOSIS — S92312D Displaced fracture of first metatarsal bone, left foot, subsequent encounter for fracture with routine healing: Secondary | ICD-10-CM | POA: Diagnosis not present

## 2020-07-28 DIAGNOSIS — S92322A Displaced fracture of second metatarsal bone, left foot, initial encounter for closed fracture: Secondary | ICD-10-CM | POA: Diagnosis not present

## 2020-07-28 DIAGNOSIS — Z880 Allergy status to penicillin: Secondary | ICD-10-CM | POA: Diagnosis not present

## 2020-07-28 DIAGNOSIS — Z79899 Other long term (current) drug therapy: Secondary | ICD-10-CM | POA: Insufficient documentation

## 2020-07-28 DIAGNOSIS — S92332D Displaced fracture of third metatarsal bone, left foot, subsequent encounter for fracture with routine healing: Secondary | ICD-10-CM | POA: Diagnosis not present

## 2020-07-28 DIAGNOSIS — Z882 Allergy status to sulfonamides status: Secondary | ICD-10-CM | POA: Insufficient documentation

## 2020-07-28 DIAGNOSIS — S92352A Displaced fracture of fifth metatarsal bone, left foot, initial encounter for closed fracture: Secondary | ICD-10-CM | POA: Diagnosis not present

## 2020-07-28 DIAGNOSIS — Z419 Encounter for procedure for purposes other than remedying health state, unspecified: Secondary | ICD-10-CM

## 2020-07-28 DIAGNOSIS — T148XXA Other injury of unspecified body region, initial encounter: Secondary | ICD-10-CM

## 2020-07-28 DIAGNOSIS — S93325A Dislocation of tarsometatarsal joint of left foot, initial encounter: Secondary | ICD-10-CM | POA: Insufficient documentation

## 2020-07-28 DIAGNOSIS — S92342A Displaced fracture of fourth metatarsal bone, left foot, initial encounter for closed fracture: Secondary | ICD-10-CM | POA: Insufficient documentation

## 2020-07-28 DIAGNOSIS — S93322A Subluxation of tarsometatarsal joint of left foot, initial encounter: Secondary | ICD-10-CM | POA: Diagnosis not present

## 2020-07-28 DIAGNOSIS — Z940 Kidney transplant status: Secondary | ICD-10-CM | POA: Insufficient documentation

## 2020-07-28 DIAGNOSIS — Y9352 Activity, horseback riding: Secondary | ICD-10-CM | POA: Diagnosis not present

## 2020-07-28 DIAGNOSIS — Z7952 Long term (current) use of systemic steroids: Secondary | ICD-10-CM | POA: Diagnosis not present

## 2020-07-28 DIAGNOSIS — S92332A Displaced fracture of third metatarsal bone, left foot, initial encounter for closed fracture: Secondary | ICD-10-CM | POA: Insufficient documentation

## 2020-07-28 DIAGNOSIS — G8918 Other acute postprocedural pain: Secondary | ICD-10-CM | POA: Diagnosis not present

## 2020-07-28 DIAGNOSIS — Z3042 Encounter for surveillance of injectable contraceptive: Secondary | ICD-10-CM

## 2020-07-28 HISTORY — PX: OPEN REDUCTION INTERNAL FIXATION (ORIF) FOOT LISFRANC FRACTURE: SHX5990

## 2020-07-28 HISTORY — DX: Type 2 diabetes mellitus without complications: E11.9

## 2020-07-28 HISTORY — PX: PERCUTANEOUS PINNING: SHX2209

## 2020-07-28 HISTORY — PX: ORIF TOE FRACTURE: SHX5032

## 2020-07-28 LAB — CBC WITH DIFFERENTIAL/PLATELET
Abs Immature Granulocytes: 0.05 10*3/uL (ref 0.00–0.07)
Basophils Absolute: 0 10*3/uL (ref 0.0–0.1)
Basophils Relative: 1 %
Eosinophils Absolute: 0.1 10*3/uL (ref 0.0–0.5)
Eosinophils Relative: 1 %
HCT: 40.2 % (ref 36.0–46.0)
Hemoglobin: 12.4 g/dL (ref 12.0–15.0)
Immature Granulocytes: 1 %
Lymphocytes Relative: 15 %
Lymphs Abs: 1.1 10*3/uL (ref 0.7–4.0)
MCH: 28.2 pg (ref 26.0–34.0)
MCHC: 30.8 g/dL (ref 30.0–36.0)
MCV: 91.4 fL (ref 80.0–100.0)
Monocytes Absolute: 0.5 10*3/uL (ref 0.1–1.0)
Monocytes Relative: 7 %
Neutro Abs: 5.5 10*3/uL (ref 1.7–7.7)
Neutrophils Relative %: 75 %
Platelets: 219 10*3/uL (ref 150–400)
RBC: 4.4 MIL/uL (ref 3.87–5.11)
RDW: 14.6 % (ref 11.5–15.5)
WBC: 7.3 10*3/uL (ref 4.0–10.5)
nRBC: 0 % (ref 0.0–0.2)

## 2020-07-28 LAB — POCT PREGNANCY, URINE: Preg Test, Ur: NEGATIVE

## 2020-07-28 LAB — COMPREHENSIVE METABOLIC PANEL
ALT: 34 U/L (ref 0–44)
AST: 17 U/L (ref 15–41)
Albumin: 3.2 g/dL — ABNORMAL LOW (ref 3.5–5.0)
Alkaline Phosphatase: 142 U/L — ABNORMAL HIGH (ref 38–126)
Anion gap: 6 (ref 5–15)
BUN: 17 mg/dL (ref 6–20)
CO2: 25 mmol/L (ref 22–32)
Calcium: 9.3 mg/dL (ref 8.9–10.3)
Chloride: 109 mmol/L (ref 98–111)
Creatinine, Ser: 1.28 mg/dL — ABNORMAL HIGH (ref 0.44–1.00)
GFR, Estimated: 56 mL/min — ABNORMAL LOW (ref >60)
Glucose, Bld: 100 mg/dL — ABNORMAL HIGH (ref 70–99)
Potassium: 3.7 mmol/L (ref 3.5–5.1)
Sodium: 140 mmol/L (ref 135–145)
Total Bilirubin: 0.5 mg/dL (ref 0.3–1.2)
Total Protein: 5.9 g/dL — ABNORMAL LOW (ref 6.5–8.1)

## 2020-07-28 LAB — SURGICAL PCR SCREEN
MRSA, PCR: NEGATIVE
Staphylococcus aureus: NEGATIVE

## 2020-07-28 LAB — PROTIME-INR
INR: 1.1 (ref 0.8–1.2)
Prothrombin Time: 14.1 seconds (ref 11.4–15.2)

## 2020-07-28 SURGERY — OPEN REDUCTION INTERNAL FIXATION (ORIF) METATARSAL (TOE) FRACTURE
Anesthesia: General | Site: Toe | Laterality: Left

## 2020-07-28 MED ORDER — LIDOCAINE HCL (PF) 2 % IJ SOLN
INTRAMUSCULAR | Status: AC
  Filled 2020-07-28: qty 5

## 2020-07-28 MED ORDER — 0.9 % SODIUM CHLORIDE (POUR BTL) OPTIME
TOPICAL | Status: DC | PRN
  Administered 2020-07-28: 1000 mL

## 2020-07-28 MED ORDER — PROPOFOL 10 MG/ML IV BOLUS
INTRAVENOUS | Status: DC | PRN
  Administered 2020-07-28: 200 mg via INTRAVENOUS

## 2020-07-28 MED ORDER — TRAZODONE HCL 50 MG PO TABS: 50.0000 mg | ORAL_TABLET | Freq: Every evening | ORAL | Status: DC | PRN

## 2020-07-28 MED ORDER — FENTANYL CITRATE (PF) 100 MCG/2ML IJ SOLN
INTRAMUSCULAR | Status: AC
  Filled 2020-07-28: qty 2

## 2020-07-28 MED ORDER — PROMETHAZINE HCL 25 MG/ML IJ SOLN
6.2500 mg | INTRAMUSCULAR | Status: DC | PRN
  Administered 2020-07-28: 6.25 mg via INTRAVENOUS

## 2020-07-28 MED ORDER — PHENYLEPHRINE 40 MCG/ML (10ML) SYRINGE FOR IV PUSH (FOR BLOOD PRESSURE SUPPORT)
PREFILLED_SYRINGE | INTRAVENOUS | Status: DC | PRN
  Administered 2020-07-28: 120 ug via INTRAVENOUS
  Administered 2020-07-28: 80 ug via INTRAVENOUS

## 2020-07-28 MED ORDER — ONDANSETRON 4 MG PO TBDP: 4.0000 mg | ORAL_TABLET | Freq: Three times a day (TID) | ORAL | 0 refills | Status: DC | PRN

## 2020-07-28 MED ORDER — VANCOMYCIN HCL 1000 MG IV SOLR
INTRAVENOUS | Status: DC | PRN
  Administered 2020-07-28: 1000 mg

## 2020-07-28 MED ORDER — METHOCARBAMOL 500 MG PO TABS: 500.0000 mg | ORAL_TABLET | Freq: Four times a day (QID) | ORAL | 0 refills | Status: DC | PRN

## 2020-07-28 MED ORDER — DEXAMETHASONE SODIUM PHOSPHATE 10 MG/ML IJ SOLN
INTRAMUSCULAR | Status: AC
  Filled 2020-07-28: qty 1

## 2020-07-28 MED ORDER — AMISULPRIDE (ANTIEMETIC) 5 MG/2ML IV SOLN
10.0000 mg | Freq: Once | INTRAVENOUS | Status: AC | PRN
  Administered 2020-07-28: 10 mg via INTRAVENOUS

## 2020-07-28 MED ORDER — CHLORHEXIDINE GLUCONATE 0.12 % MT SOLN
15.0000 mL | Freq: Once | OROMUCOSAL | Status: AC
  Administered 2020-07-28: 15 mL via OROMUCOSAL

## 2020-07-28 MED ORDER — PROMETHAZINE HCL 25 MG/ML IJ SOLN
INTRAMUSCULAR | Status: AC
  Filled 2020-07-28: qty 1

## 2020-07-28 MED ORDER — AMISULPRIDE (ANTIEMETIC) 5 MG/2ML IV SOLN
INTRAVENOUS | Status: AC
  Filled 2020-07-28: qty 4

## 2020-07-28 MED ORDER — CHLORHEXIDINE GLUCONATE 0.12 % MT SOLN
OROMUCOSAL | Status: AC
  Filled 2020-07-28: qty 15

## 2020-07-28 MED ORDER — CHLORHEXIDINE GLUCONATE 4 % EX LIQD: 60.0000 mL | Freq: Once | CUTANEOUS | Status: DC

## 2020-07-28 MED ORDER — OXYCODONE HCL 5 MG/5ML PO SOLN: 5.0000 mg | Freq: Once | ORAL | Status: DC | PRN

## 2020-07-28 MED ORDER — FENTANYL CITRATE (PF) 250 MCG/5ML IJ SOLN
INTRAMUSCULAR | Status: AC
  Filled 2020-07-28: qty 5

## 2020-07-28 MED ORDER — ROCURONIUM BROMIDE 10 MG/ML (PF) SYRINGE
PREFILLED_SYRINGE | INTRAVENOUS | Status: AC
  Filled 2020-07-28: qty 10

## 2020-07-28 MED ORDER — BUPIVACAINE LIPOSOME 1.3 % IJ SUSP
INTRAMUSCULAR | Status: DC | PRN
  Administered 2020-07-28: 10 mL via PERINEURAL

## 2020-07-28 MED ORDER — BUPIVACAINE HCL (PF) 0.5 % IJ SOLN
INTRAMUSCULAR | Status: DC | PRN
  Administered 2020-07-28: 20 mL via PERINEURAL

## 2020-07-28 MED ORDER — LACTATED RINGERS IV SOLN: INTRAVENOUS | Status: DC

## 2020-07-28 MED ORDER — DEXAMETHASONE SODIUM PHOSPHATE 10 MG/ML IJ SOLN
INTRAMUSCULAR | Status: DC | PRN
  Administered 2020-07-28: 5 mg via INTRAVENOUS

## 2020-07-28 MED ORDER — POVIDONE-IODINE 10 % EX SWAB
2.0000 "application " | Freq: Once | CUTANEOUS | Status: AC
  Administered 2020-07-28: 2 via TOPICAL

## 2020-07-28 MED ORDER — VANCOMYCIN HCL 1000 MG IV SOLR
INTRAVENOUS | Status: AC
  Filled 2020-07-28: qty 1000

## 2020-07-28 MED ORDER — MEPERIDINE HCL 25 MG/ML IJ SOLN: 6.2500 mg | INTRAMUSCULAR | Status: DC | PRN

## 2020-07-28 MED ORDER — PROPOFOL 10 MG/ML IV BOLUS
INTRAVENOUS | Status: AC
  Filled 2020-07-28: qty 20

## 2020-07-28 MED ORDER — OXYCODONE HCL 5 MG PO TABS: 5.0000 mg | ORAL_TABLET | Freq: Once | ORAL | Status: DC | PRN

## 2020-07-28 MED ORDER — POVIDONE-IODINE 10 % EX SWAB: 2.0000 "application " | Freq: Once | CUTANEOUS | Status: DC

## 2020-07-28 MED ORDER — CEFAZOLIN SODIUM-DEXTROSE 2-4 GM/100ML-% IV SOLN
2.0000 g | INTRAVENOUS | Status: AC
  Administered 2020-07-28: 2 g via INTRAVENOUS

## 2020-07-28 MED ORDER — MIDAZOLAM HCL 2 MG/2ML IJ SOLN
INTRAMUSCULAR | Status: AC
  Filled 2020-07-28: qty 2

## 2020-07-28 MED ORDER — CEFAZOLIN SODIUM-DEXTROSE 2-4 GM/100ML-% IV SOLN
INTRAVENOUS | Status: AC
  Filled 2020-07-28: qty 100

## 2020-07-28 MED ORDER — FENTANYL CITRATE (PF) 250 MCG/5ML IJ SOLN
INTRAMUSCULAR | Status: DC | PRN
  Administered 2020-07-28 (×3): 50 ug via INTRAVENOUS

## 2020-07-28 MED ORDER — LIDOCAINE HCL (PF) 2 % IJ SOLN
INTRAMUSCULAR | Status: DC | PRN
  Administered 2020-07-28: 60 mg via INTRADERMAL

## 2020-07-28 MED ORDER — MIDAZOLAM HCL 2 MG/2ML IJ SOLN
INTRAMUSCULAR | Status: DC | PRN
  Administered 2020-07-28 (×2): 1 mg via INTRAVENOUS

## 2020-07-28 MED ORDER — ONDANSETRON HCL 4 MG/2ML IJ SOLN
INTRAMUSCULAR | Status: DC | PRN
  Administered 2020-07-28: 4 mg via INTRAVENOUS

## 2020-07-28 MED ORDER — ONDANSETRON HCL 4 MG/2ML IJ SOLN
INTRAMUSCULAR | Status: AC
  Filled 2020-07-28: qty 2

## 2020-07-28 MED ORDER — HYDROMORPHONE HCL 1 MG/ML IJ SOLN: 0.2500 mg | INTRAMUSCULAR | Status: DC | PRN

## 2020-07-28 MED ORDER — MEDROXYPROGESTERONE ACETATE 150 MG/ML IM SUSP: 150.0000 mg | INTRAMUSCULAR | Status: DC

## 2020-07-28 SURGICAL SUPPLY — 89 items
APL SKNCLS STERI-STRIP NONHPOA (GAUZE/BANDAGES/DRESSINGS)
BANDAGE ESMARK 6X9 LF (GAUZE/BANDAGES/DRESSINGS) ×3 IMPLANT
BENZOIN TINCTURE PRP APPL 2/3 (GAUZE/BANDAGES/DRESSINGS) IMPLANT
BIT DRILL STRATUM ST W/SLV 2.7 (BIT) IMPLANT
BIT DRILL STRM STD 3.5 (BIT) ×1 IMPLANT
BLADE SURG 10 STRL SS (BLADE) ×4 IMPLANT
BNDG CMPR 9X6 STRL LF SNTH (GAUZE/BANDAGES/DRESSINGS) ×3
BNDG COHESIVE 4X5 TAN STRL (GAUZE/BANDAGES/DRESSINGS) ×4 IMPLANT
BNDG ELASTIC 3X5.8 VLCR STR LF (GAUZE/BANDAGES/DRESSINGS) ×4 IMPLANT
BNDG ELASTIC 4X5.8 VLCR STR LF (GAUZE/BANDAGES/DRESSINGS) ×1 IMPLANT
BNDG ELASTIC 6X5.8 VLCR STR LF (GAUZE/BANDAGES/DRESSINGS) IMPLANT
BNDG ESMARK 6X9 LF (GAUZE/BANDAGES/DRESSINGS) ×4
BNDG GAUZE ELAST 4 BULKY (GAUZE/BANDAGES/DRESSINGS) ×7 IMPLANT
BRUSH SCRUB EZ PLAIN DRY (MISCELLANEOUS) ×8 IMPLANT
COVER MAYO STAND STRL (DRAPES) ×4 IMPLANT
COVER SURGICAL LIGHT HANDLE (MISCELLANEOUS) ×8 IMPLANT
COVER WAND RF STERILE (DRAPES) ×4 IMPLANT
CUFF TOURN SGL QUICK 34 (TOURNIQUET CUFF) ×4
CUFF TRNQT CYL 34X4.125X (TOURNIQUET CUFF) IMPLANT
DRAPE C-ARM 42X72 X-RAY (DRAPES) ×4 IMPLANT
DRAPE C-ARMOR (DRAPES) ×4 IMPLANT
DRAPE HALF SHEET 40X57 (DRAPES) ×5 IMPLANT
DRAPE U-SHAPE 47X51 STRL (DRAPES) ×4 IMPLANT
DRILL STRATUM ST W/SLV 2.7 (BIT) ×4
DRIVER STRM ST T10 (ORTHOPEDIC DISPOSABLE SUPPLIES) ×1 IMPLANT
DRSG EMULSION OIL 3X3 NADH (GAUZE/BANDAGES/DRESSINGS) ×1 IMPLANT
DRSG XEROFORM 1X8 (GAUZE/BANDAGES/DRESSINGS) ×2 IMPLANT
ELECT REM PT RETURN 9FT ADLT (ELECTROSURGICAL) ×4
ELECTRODE REM PT RTRN 9FT ADLT (ELECTROSURGICAL) ×3 IMPLANT
GAUZE SPONGE 4X4 12PLY STRL (GAUZE/BANDAGES/DRESSINGS) ×2 IMPLANT
GAUZE XEROFORM 1X8 LF (GAUZE/BANDAGES/DRESSINGS) IMPLANT
GLOVE BIO SURGEON STRL SZ7.5 (GLOVE) ×4 IMPLANT
GLOVE BIO SURGEON STRL SZ8 (GLOVE) ×4 IMPLANT
GLOVE BIOGEL PI IND STRL 7.5 (GLOVE) ×3 IMPLANT
GLOVE BIOGEL PI INDICATOR 7.5 (GLOVE) ×1
GLOVE SRG 8 PF TXTR STRL LF DI (GLOVE) ×3 IMPLANT
GLOVE SURG UNDER POLY LF SZ8 (GLOVE) ×4
GOWN STRL REUS W/ TWL LRG LVL3 (GOWN DISPOSABLE) ×6 IMPLANT
GOWN STRL REUS W/ TWL XL LVL3 (GOWN DISPOSABLE) ×3 IMPLANT
GOWN STRL REUS W/TWL LRG LVL3 (GOWN DISPOSABLE) ×8
GOWN STRL REUS W/TWL XL LVL3 (GOWN DISPOSABLE) ×4
KIT BASIN OR (CUSTOM PROCEDURE TRAY) ×4 IMPLANT
KIT STRATUM INSTRUMENT STD (KITS) ×1 IMPLANT
KIT TURNOVER KIT B (KITS) ×4 IMPLANT
MANIFOLD NEPTUNE II (INSTRUMENTS) ×4 IMPLANT
NDL HYPO 21X1.5 SAFETY (NEEDLE) IMPLANT
NEEDLE HYPO 21X1.5 SAFETY (NEEDLE) IMPLANT
NS IRRIG 1000ML POUR BTL (IV SOLUTION) ×4 IMPLANT
PACK GENERAL/GYN (CUSTOM PROCEDURE TRAY) ×4 IMPLANT
PACK ORTHO EXTREMITY (CUSTOM PROCEDURE TRAY) ×4 IMPLANT
PAD ARMBOARD 7.5X6 YLW CONV (MISCELLANEOUS) ×8 IMPLANT
PAD CAST 4YDX4 CTTN HI CHSV (CAST SUPPLIES) IMPLANT
PADDING CAST ABS 6INX4YD NS (CAST SUPPLIES) ×1
PADDING CAST ABS COTTON 6X4 NS (CAST SUPPLIES) IMPLANT
PADDING CAST COTTON 4X4 STRL (CAST SUPPLIES) ×12
PADDING CAST COTTON 6X4 STRL (CAST SUPPLIES) ×1 IMPLANT
PENCIL BUTTON HOLSTER BLD 10FT (ELECTRODE) ×3 IMPLANT
PIN STEINMAN 5/64X9 TRO PT NS (PIN) ×3 IMPLANT
PLATE MCF STRM SM LT (Plate) ×1 IMPLANT
SCREW LOCK ST STRM 3.5X24 (Screw) ×2 IMPLANT
SCREW LOCK ST STRM 3.5X26 (Screw) ×1 IMPLANT
SCREW LOCK STRATUM 3.5X20 (Screw) ×1 IMPLANT
SCREW LP STRM NL 3.5X26 ST (Screw) ×1 IMPLANT
SCREW MDS ST STRM 3.5X20 (Screw) ×1 IMPLANT
SCREW STRATUM NL LP ST 3.5X24 (Screw) ×1 IMPLANT
SCREW STRM NL LP 3.5X20 (Screw) ×3 IMPLANT
SLEEVE SURGEON STRL (DRAPES) ×1 IMPLANT
SPONGE LAP 18X18 RF (DISPOSABLE) ×10 IMPLANT
STAPLER VISISTAT 35W (STAPLE) IMPLANT
STRIP CLOSURE SKIN 1/2X4 (GAUZE/BANDAGES/DRESSINGS) IMPLANT
SUCTION FRAZIER HANDLE 10FR (MISCELLANEOUS) ×4
SUCTION TUBE FRAZIER 10FR DISP (MISCELLANEOUS) ×3 IMPLANT
SUT ETHILON 2 0 FS 18 (SUTURE) ×9 IMPLANT
SUT ETHILON 3 0 PS 1 (SUTURE) ×7 IMPLANT
SUT ETHILON 4 0 P 3 18 (SUTURE) IMPLANT
SUT ETHILON 5 0 P 3 18 (SUTURE)
SUT NYLON ETHILON 5-0 P-3 1X18 (SUTURE) IMPLANT
SUT PDS AB 2-0 CT1 27 (SUTURE) IMPLANT
SUT PROLENE 4 0 P 3 18 (SUTURE) IMPLANT
SUT VIC AB 2-0 CT1 27 (SUTURE)
SUT VIC AB 2-0 CT1 TAPERPNT 27 (SUTURE) ×6 IMPLANT
SUT VIC AB 2-0 CT3 27 (SUTURE) IMPLANT
SUT VIC AB 3-0 PS2 18 (SUTURE) ×1 IMPLANT
SYR CONTROL 10ML LL (SYRINGE) IMPLANT
TOWEL GREEN STERILE (TOWEL DISPOSABLE) ×8 IMPLANT
TOWEL GREEN STERILE FF (TOWEL DISPOSABLE) ×4 IMPLANT
TUBE CONNECTING 12X1/4 (SUCTIONS) ×4 IMPLANT
UNDERPAD 30X36 HEAVY ABSORB (UNDERPADS AND DIAPERS) ×4 IMPLANT
WATER STERILE IRR 1000ML POUR (IV SOLUTION) ×4 IMPLANT

## 2020-07-28 NOTE — Anesthesia Procedure Notes (Signed)
Anesthesia Regional Block: Popliteal block   Pre-Anesthetic Checklist: , timeout performed,  Correct Patient, Correct Site, Correct Laterality,  Correct Procedure, Correct Position, site marked,  Risks and benefits discussed,  Surgical consent,  Pre-op evaluation,  At surgeon's request and post-op pain management  Laterality: Left  Prep: chloraprep       Needles:  Injection technique: Single-shot  Needle Type: Stimiplex     Needle Length: 9cm  Needle Gauge: 21     Additional Needles:   Procedures:,,,, ultrasound used (permanent image in chart),,    Narrative:  Start time: 07/28/2020 8:13 AM End time: 07/28/2020 8:18 AM Injection made incrementally with aspirations every 5 mL.  Performed by: Personally  Anesthesiologist: Lynda Rainwater, MD

## 2020-07-28 NOTE — H&P (Signed)
[facility] H&P/Consult     Patient ID: Tracy [name] MRN: [id number] DOB/AGE: Jun 24, 1985 35 y.o.  Chief Complaint: left foot fracture dislocations HPI: Tracy [name] is an 35 y.o. female thrown from a horse with multiple foot fractures and subluxation of the metatarsal tarsal joint. S/p combo renal and pancreas transplant after sepsis in 2017 and 2016, respectively.  Past Medical History:  Diagnosis Date   Abscess of right thigh    Anemia    presently on iron supplement   Anxiety    ASCUS with positive high risk human papillomavirus of vagina 05/01/2020   04/2020 ASCUS +HPV 16 and other will get Colpo    Atypical squamous cell changes of undetermined significance (ASCUS) on vaginal cytology with positive high risk human papilloma virus (HPV) 09/05/2016   Get colpo   Chronic kidney disease    on dialysis T, Th, Sat   CMV (cytomegalovirus infection) ([place])    Depression    Detached retina    Diabetes mellitus without complication ([place])    History of organ transplantation 08/18/2015   HSV-1 (herpes simplex virus 1) infection    HSV-2 (herpes simplex virus 2) infection    Hypertension    Hypothyroidism    Irregular periods 07/05/2014   Neuropathy    in feet   Seizures ([place]) 01/2014   ? due to blood sugar   Thyroid enlargement    "not on medication at this time"   Urinary tract infection    hx of   Vaginal discharge 07/05/2014   Vaginal Pap smear, abnormal    Yeast infection    took diflucan saturday     Past Surgical History:  Procedure Laterality Date   APPLICATION OF A-CELL OF EXTREMITY Right 10/03/2014   Procedure: APPLICATION OF A-CELL OF RIGHT UPPER THIGH WOUND;  Surgeon: Renette Butters, MD;  Location: [facility];  Service: Orthopedics;  Laterality: Right;   APPLICATION OF WOUND VAC Right 10/03/2014   Procedure: APPLICATION OF WOUND VAC RIGHT THIGH;  Surgeon: Renette Butters, MD;  Location: [facility];  Service: Orthopedics;   Laterality: Right;   AV FISTULA PLACEMENT Left 09/01/2014   Procedure: BRACHIOCEPHALIC ARTERIOVENOUS (AV) FISTULA CREATION;  Surgeon: Conrad Osgood, MD;  Location: [facility];  Service: Vascular;  Laterality: Left;   CHOLECYSTECTOMY     COMBINED KIDNEY-PANCREAS TRANSPLANT  05/15/15   I & D EXTREMITY Right 08/14/2014   Procedure: IRRIGATION AND DEBRIDEMENT EXTREMITY WITH MUSCLE BIOPSY;  Surgeon: Roseanne Kaufman, MD;  Location: [facility];  Service: Orthopedics;  Laterality: Right;   I & D EXTREMITY Right 08/20/2014   Procedure:  I AND D LEG / THIGH ABSCESS AND MANIPULATION OF RIGHT ELBOW.;  Surgeon: Renette Butters, MD;  Location: [facility];  Service: Orthopedics;  Laterality: Right;   I & D EXTREMITY Right 09/30/2014   Procedure: IRRIGATION AND DEBRIDEMENT RIGHT THIGH ABSCESS;  Surgeon: [name], MD;  Location: [facility];  Service: Orthopedics;  Laterality: Right;   I & D EXTREMITY Right 10/03/2014   Procedure: IRRIGATION AND DEBRIDEMENT RIGHT THIGH ABSCESS,WITH WOUND CLOSURE & MANIPULATION OF RIGHT KNEE;  Surgeon: Renette Butters, MD;  Location: [facility];  Service: Orthopedics;  Laterality: Right;   INCISION AND DRAINAGE ABSCESS Right 12/09/2014   Procedure: INCISION AND DRAINAGE ABSCESS;  Surgeon: Renette Butters, MD;  Location: [facility];  Service: Orthopedics;  Laterality: Right;   INSERTION OF DIALYSIS CATHETER N/A 09/01/2014   Procedure: INSERTION OF DIALYSIS CATHETER IN  RIGHT INTERNAL JUGUILAR;  Surgeon: Conrad [place], MD;  Location: [facility];  Service: Vascular;  Laterality: N/A;   KIDNEY TRANSPLANT  05/15/15   PARS PLANA VITRECTOMY  10/01/2011   Procedure: PARS PLANA VITRECTOMY WITH 25 GAUGE;  Surgeon: Hayden Pedro, MD;  Location: [facility];  Service: Ophthalmology;  Laterality: Right;  Repair Complex Traction Retinal Detachment   PILONIDAL CYST EXCISION     TRIGGER FINGER RELEASE Right 09/21/13   WISDOM TOOTH EXTRACTION      Family History  Problem Relation Age of Onset   Cancer Paternal Grandfather        prostate    Hyperlipidemia Paternal Grandfather    Stroke Paternal Grandfather    Social History:  reports that she has never smoked. She has never used smokeless tobacco. She reports that she does not drink alcohol and does not use drugs.  Allergies:  Allergies  Allergen Reactions   Penicillins Hives and Swelling    Has patient had a PCN reaction causing immediate rash, facial/tongue/throat swelling, SOB or lightheadedness with hypotension: Yes Has patient had a PCN reaction causing severe rash involving mucus membranes or skin necrosis: Yes Has patient had a PCN reaction that required hospitalization No Has patient had a PCN reaction occurring within the last 10 years: No If all of the above answers are "NO", then may proceed with Cephalosporin use.    Sulfa Antibiotics Hives    Medications Prior to Admission  Medication Sig Dispense Refill   mycophenolate (MYFORTIC) 180 MG EC tablet Take 720 mg by mouth 2 (two) times daily.     oxyCODONE-acetaminophen (PERCOCET/ROXICET) 5-325 MG tablet Take 1 tablet by mouth every 6 (six) hours as needed for pain.     predniSONE (DELTASONE) 10 MG tablet Take 10 mg by mouth daily with breakfast.     tacrolimus (PROGRAF) 1 MG capsule Take 2 mg by mouth 2 (two) times daily.     traZODone (DESYREL) 50 MG tablet Take 2 tablets (100 mg total) by mouth at bedtime. (Patient taking differently: Take 50 mg by mouth at bedtime as needed for sleep.) 180 tablet 1   LORazepam (ATIVAN) 1 MG tablet TAKE 1 TABLET BY MOUTH EVERY DAY AT BEDTIME (Patient not taking: Reported on 07/26/2020) 30 tablet 0   medroxyPROGESTERone (DEPO-PROVERA) 150 MG/ML injection INJECT ONCE EVERY 3 MONTHS (Patient taking differently: Inject 150 mg into the muscle every 3 (three) months.) 1 mL 4   megestrol (MEGACE) 40 MG tablet TAKE 2 DAILY TO STOP BLEEDING (Patient not taking: Reported on 07/26/2020) 60 tablet 0   valACYclovir (VALTREX) 1000 MG tablet TAKE 1 TABLET TWICE A DAY X 10 DAYS THEN CAN TAKE  DAILY (Patient not taking: Reported on 07/26/2020) 30 tablet 7    Results for orders placed or performed during the hospital encounter of 07/28/20 (from the past 48 hour(s))  CBC WITH DIFFERENTIAL     Status: None   Collection Time: 07/28/20  5:57 AM  Result Value Ref Range   WBC 7.3 4.0 - 10.5 K/uL   RBC 4.40 3.87 - 5.11 MIL/uL   Hemoglobin 12.4 12.0 - 15.0 g/dL   HCT 40.2 36.0 - 46.0 %   MCV 91.4 80.0 - 100.0 fL   MCH 28.2 26.0 - 34.0 pg   MCHC 30.8 30.0 - 36.0 g/dL   RDW 14.6 11.5 - 15.5 %   Platelets 219 150 - 400 K/uL   nRBC 0.0 0.0 - 0.2 %   Neutrophils Relative % 75 %  Neutro Abs 5.5 1.7 - 7.7 K/uL   Lymphocytes Relative 15 %   Lymphs Abs 1.1 0.7 - 4.0 K/uL   Monocytes Relative 7 %   Monocytes Absolute 0.5 0.1 - 1.0 K/uL   Eosinophils Relative 1 %   Eosinophils Absolute 0.1 0.0 - 0.5 K/uL   Basophils Relative 1 %   Basophils Absolute 0.0 0.0 - 0.1 K/uL   Immature Granulocytes 1 %   Abs Immature Granulocytes 0.05 0.00 - 0.07 K/uL    Comment: Performed at [facility] [address], [place], [place] [address]  Comprehensive metabolic panel     Status: Abnormal   Collection Time: 07/28/20  5:57 AM  Result Value Ref Range   Sodium 140 135 - 145 mmol/L   Potassium 3.7 3.5 - 5.1 mmol/L   Chloride 109 98 - 111 mmol/L   CO2 25 22 - 32 mmol/L   Glucose, Bld 100 (H) 70 - 99 mg/dL    Comment: Glucose reference range applies only to samples taken after fasting for at least 8 hours.   BUN 17 6 - 20 mg/dL   Creatinine, Ser 1.28 (H) 0.44 - 1.00 mg/dL   Calcium 9.3 8.9 - 10.3 mg/dL   Total Protein 5.9 (L) 6.5 - 8.1 g/dL   Albumin 3.2 (L) 3.5 - 5.0 g/dL   AST 17 15 - 41 U/L   ALT 34 0 - 44 U/L   Alkaline Phosphatase 142 (H) 38 - 126 U/L   Total Bilirubin 0.5 0.3 - 1.2 mg/dL   GFR, Estimated 56 (L) >60 mL/min    Comment: (NOTE) Calculated using the CKD-EPI Creatinine Equation (2021)    Anion gap 6 5 - 15    Comment: Performed at [facility], [place] [address], [place], [place] [address]  Protime-INR     Status: None   Collection Time: 07/28/20  5:57 AM  Result Value Ref Range   Prothrombin Time 14.1 11.4 - 15.2 seconds   INR 1.1 0.8 - 1.2    Comment: (NOTE) INR goal varies based on device and disease states. Performed at [facility], [place] [address], [place], [place] [address]   Pregnancy, urine POC     Status: None   Collection Time: 07/28/20  7:07 AM  Result Value Ref Range   Preg Test, Ur NEGATIVE NEGATIVE    Comment:        THE SENSITIVITY OF THIS METHODOLOGY IS >24 mIU/mL    No results found.  ROS No recent fever, bleeding abnormalities, urologic dysfunction, GI problems, or weight gain despite extensive med history.  Blood pressure 137/63, pulse 73, temperature 98.1 F (36.7 C), temperature source Oral, resp. rate 18, height '5\' 2"'$  (1.575 m), weight 74.8 kg, SpO2 100 %. Physical Exam NCAT RRR Lungs without retraction or audible wheezing LLE Dressing intact, clean, dry  Edema/ swelling controlled  Sens: DPN, SPN, TN intact  Motor: EHL, FHL, and lessor toe ext and flex all intact grossly  Brisk cap refill, warm to touch   Assessment/Plan  Left foot fracture dislocation  I discussed with the patient the risks and benefits of surgery, including the possibility of infection, nerve injury, vessel injury, wound breakdown, arthritis, symptomatic hardware, DVT/ PE, loss of motion, malunion, nonunion, and need for further surgery among others.  We also specifically discussed the elevated risk of soft tissue breakdown that could lead to amputation, particularly with noncompliance.  She acknowledged these risks and wished to proceed.  Altamese Manahawkin, MD [facility], [facility] [phone]  07/28/2020, 8:10 AM  [facility] [place] [address] [phone] (F)

## 2020-07-28 NOTE — Transfer of Care (Signed)
Immediate Anesthesia Transfer of Care Note  Patient: Tracy [name]  Procedure(s) Performed: OPEN REDUCTION INTERNAL FIXATION (ORIF) METATARSAL (TOE) FRACTURE (Left: Toe) PERCUTANEOUS PINNING  2ND AND 3RD METATARSALS (Left) OPEN REDUCTION INTERNAL FIXATION (ORIF) FOOT LISFRANC FRACTURE (Left: Foot)  Patient Location: PACU  Anesthesia Type:General and Regional  Level of Consciousness: awake and oriented  Airway & Oxygen Therapy: Patient Spontanous Breathing  Post-op Assessment: Report given to RN  Post vital signs: Reviewed and stable  Last Vitals:  Vitals Value Taken Time  BP 130/76 07/28/20 1155  Temp    Pulse 73 07/28/20 1155  Resp    SpO2 98 % 07/28/20 1155  Vitals shown include unvalidated device data.  Last Pain:  Vitals:   07/28/20 0657  TempSrc:   PainSc: 2       Patients Stated Pain Goal: 1 ([id number])  Complications: No notable events documented.

## 2020-07-28 NOTE — Discharge Instructions (Addendum)
Orthopaedic Trauma Service Discharge Instructions   General Discharge Instructions   WEIGHT BEARING STATUS: nonweightbearing left leg   RANGE OF MOTION/ACTIVITY: activity as tolerated while maintaining weightbearing restrictions    Wound Care:DO NOT remove splint. Keep splint clean and dry    Diet: as you were eating previously.  Can use over the counter stool softeners and bowel preparations, such as Miralax, to help with bowel movements.  Narcotics can be constipating.  Be sure to drink plenty of fluids  PAIN MEDICATION USE AND EXPECTATIONS  You have likely been given narcotic medications to help control your pain.  After a traumatic event that results in an fracture (broken bone) with or without surgery, it is ok to use narcotic pain medications to help control one's pain.  We understand that everyone responds to pain differently and each individual patient will be evaluated on a regular basis for the continued need for narcotic medications. Ideally, narcotic medication use should last no more than 6-8 weeks (coinciding with fracture healing).   As a patient it is your responsibility as well to monitor narcotic medication use and report the amount and frequency you use these medications when you come to your office visit.   We would also advise that if you are using narcotic medications, you should take a dose prior to therapy to maximize you participation.  IF YOU ARE ON NARCOTIC MEDICATIONS IT IS NOT PERMISSIBLE TO OPERATE A MOTOR VEHICLE (MOTORCYCLE/CAR/TRUCK/MOPED) OR HEAVY MACHINERY DO NOT MIX NARCOTICS WITH OTHER CNS (CENTRAL NERVOUS SYSTEM) DEPRESSANTS SUCH AS ALCOHOL   POST-OPERATIVE OPIOID TAPER INSTRUCTIONS: It is important to wean off of your opioid medication as soon as possible. If you do not need pain medication after your surgery it is ok to stop day one. Opioids include: Codeine, Hydrocodone(Norco, Vicodin), Oxycodone(Percocet, oxycontin) and hydromorphone amongst  others.  Long term and even short term use of opiods can cause: Increased pain response Dependence Constipation Depression Respiratory depression And more.  Withdrawal symptoms can include Flu like symptoms Nausea, vomiting And more Techniques to manage these symptoms Hydrate well Eat regular healthy meals Stay active Use relaxation techniques(deep breathing, meditating, yoga) Do Not substitute Alcohol to help with tapering If you have been on opioids for less than two weeks and do not have pain than it is ok to stop all together.  Plan to wean off of opioids This plan should start within one week post op of your fracture surgery  Maintain the same interval or time between taking each dose and first decrease the dose.  Cut the total daily intake of opioids by one tablet each day Next start to increase the time between doses. The last dose that should be eliminated is the evening dose.    STOP SMOKING OR USING NICOTINE PRODUCTS!!!!  As discussed nicotine severely impairs your body's ability to heal surgical and traumatic wounds but also impairs bone healing.  Wounds and bone heal by forming microscopic blood vessels (angiogenesis) and nicotine is a vasoconstrictor (essentially, shrinks blood vessels).  Therefore, if vasoconstriction occurs to these microscopic blood vessels they essentially disappear and are unable to deliver necessary nutrients to the healing tissue.  This is one modifiable factor that you can do to dramatically increase your chances of healing your injury.    (This means no smoking, no nicotine gum, patches, etc)  DO NOT USE NONSTEROIDAL ANTI-INFLAMMATORY DRUGS (NSAID'S)  Using products such as Advil (ibuprofen), Aleve (naproxen), Motrin (ibuprofen) for additional pain control during fracture healing can  delay and/or prevent the healing response.  If you would like to take over the counter (OTC) medication, Tylenol (acetaminophen) is ok.  However, some narcotic  medications that are given for pain control contain acetaminophen as well. Therefore, you should not exceed more than 4000 mg of tylenol in a day if you do not have liver disease.  Also note that there are may OTC medicines, such as cold medicines and allergy medicines that my contain tylenol as well.  If you have any questions about medications and/or interactions please ask your doctor/PA or your pharmacist.      ICE AND ELEVATE INJURED/OPERATIVE EXTREMITY  Using ice and elevating the injured extremity above your heart can help with swelling and pain control.  Icing in a pulsatile fashion, such as 20 minutes on and 20 minutes off, can be followed.    Do not place ice directly on skin. Make sure there is a barrier between to skin and the ice pack.    Using frozen items such as frozen peas works well as the conform nicely to the are that needs to be iced.  USE AN ACE WRAP OR TED HOSE FOR SWELLING CONTROL  In addition to icing and elevation, Ace wraps or TED hose are used to help limit and resolve swelling.  It is recommended to use Ace wraps or TED hose until you are informed to stop.    When using Ace Wraps start the wrapping distally (farthest away from the body) and wrap proximally (closer to the body)   Example: If you had surgery on your leg or thing and you do not have a splint on, start the ace wrap at the toes and work your way up to the thigh        If you had surgery on your upper extremity and do not have a splint on, start the ace wrap at your fingers and work your way up to the upper arm  IF YOU ARE IN A SPLINT OR CAST DO NOT [place]   If your splint gets wet for any reason please contact the office immediately. You may shower in your splint or cast as long as you keep it dry.  This can be done by wrapping in a cast cover or garbage back (or similar)  Do Not stick any thing down your splint or cast such as pencils, money, or hangers to try and scratch yourself with.  If  you feel itchy take benadryl as prescribed on the bottle for itching  IF YOU ARE IN A CAM BOOT (BLACK BOOT)  You may remove boot periodically. Perform daily dressing changes as noted below.  Wash the liner of the boot regularly and wear a sock when wearing the boot. It is recommended that you sleep in the boot until told otherwise    Call office for the following: Temperature greater than 101F Persistent nausea and vomiting Severe uncontrolled pain Redness, tenderness, or signs of infection (pain, swelling, redness, odor or green/yellow discharge around the site) Difficulty breathing, headache or visual disturbances Hives Persistent dizziness or light-headedness Extreme fatigue Any other questions or concerns you may have after discharge  In an emergency, call 911 or go to an Emergency Department at a nearby hospital  HELPFUL INFORMATION  If you had a block, it will wear off between 8-24 hrs postop typically.  This is period when your pain may go from nearly zero to the pain you would have had postop without the  block.  This is an abrupt transition but nothing dangerous is happening.  You may take an extra dose of narcotic when this happens.  You should wean off your narcotic medicines as soon as you are able.  Most patients will be off or using minimal narcotics before their first postop appointment.   We suggest you use the pain medication the first night prior to going to bed, in order to ease any pain when the anesthesia wears off. You should avoid taking pain medications on an empty stomach as it will make you nauseous.  Do not drink alcoholic beverages or take illicit drugs when taking pain medications.  In most states it is against the law to drive while you are in a splint or sling.  And certainly against the law to drive while taking narcotics.  You may return to work/school in the next couple of days when you feel up to it.   Pain medication may make you constipated.   Below are a few solutions to try in this order: Decrease the amount of pain medication if you aren't having pain. Drink lots of decaffeinated fluids. Drink prune juice and/or each dried prunes  If the first 3 don't work start with additional solutions Take Colace - an over-the-counter stool softener Take Senokot - an over-the-counter laxative Take Miralax - a stronger over-the-counter laxative     CALL THE OFFICE WITH ANY QUESTIONS OR CONCERNS: [phone]   VISIT OUR WEBSITE FOR ADDITIONAL INFORMATION: [email or web address]

## 2020-07-28 NOTE — Op Note (Addendum)
NAME: Tracy [name] MEDICAL RECORD [id number] DATE OF BIRTH: 1985/09/13 PHYSICIAN:Jazari Ober H. Sopheap Basic, MD  OPERATIVE REPORT  DATE OF PROCEDURE:  07/28/2020  PREOPERATIVE DIAGNOSES:   1.  Disrupted medial tarsometatarsal (Lisfranc) joint dislocation. 2.  First metatarsal base fracture. 3.  Second metatarsal shaft fracture. 4.  Third metatarsal shaft fracture. 5.  Fourth metatarsal fracture. 6.  Fifth metatarsal proximal avulsion fracture.  POSTOPERATIVE DIAGNOSES:  1.  Disrupted medial tarsometatarsal (Lisfranc) joint dislocation. 2.  First metatarsal base fracture. 3.  Second metatarsal shaft fracture. 4.  Third metatarsal shaft fracture. 5.  Fourth metatarsal fracture. 6.  Fifth metatarsal proximal avulsion fracture.  PROCEDURES:   1.  Open reduction internal fixation left tarsometatarsal joint dislocation.   2.  Open reduction internal fixation of left first metatarsal base fracture. 3.  Open reduction internal fixation of left second metatarsal shaft fracture.   4.  Open reduction internal fixation of left third metatarsal shaft fracture. 5.  Closed treatment of left fourth metatarsal shaft fracture with manipulation. 6.  Closed treatment of left fifth metatarsal avulsion fracture without manipulation.  SURGEON:  Altamese Hazel Run, MD  ASSISTANT:  1. Ainsley Spinner PA-C.; 2. PA Student  ANESTHESIA:  General, supplemented with regional block.  COMPLICATIONS:  None.  TOURNIQUET:  None.  DISPOSITION:  To PACU.  CONDITION:  Stable.  BRIEF SUMMARY FOR PROCEDURE:  The patient is a very pleasant 35 y.o. s/p renal and pancreas transplantation who sustained left foot trauma when bucked from a horse onto hard treat root resulting in severe foot trauma with fracture dislocations of the tarsometatarsal joints.  I discussed with the patient the risks and benefits of surgical treatment, including the possibility of infection or wound healing problems given the severity of the soft tissue  swelling, arthritis, loss  of reduction, DVT, PE, pin tract infection, nerve or vessel injury, potential need for further surgery including removal of any bridging fixation, and multiple others including anesthetic complications.  The patient acknowledged these risks and provided consent to proceed.  BRIEF SUMMARY OF PROCEDURE:  The patient received nerve block, preoperative antibiotics, and then general anesthesia was induced.  The left lower extremity underwent chlorhexidine wash and Betadine scrub and paint.  Standard draping was performed and then a timeout  held.  C-arm was brought in to identify the correct location for the incisions given his swelling, which did somewhat obscure bony landmarks.  Because of the 1st metatarsal base  fracture, resulting in impaction, combined with joint instability, bridge plate was anticipated on the medial side.  Consequently, I began with an open reduction of the Lisfranc joint by making a medial longitudinal incision to accommodate placement of appropriate length Stratum plate. The plate was positioned and K wires driven into the medial cuneiform for blade insertion. The plate was inserted and then distraction applied to reduce the tarsometatarsal joint which was secured distally with a screw. After checking multiple views additional 2 screws were placed in the medial cuneiform  and 2 more screws into the distal metatarsal shaft. At that time we turned attention to the first metarsal fracture itself, placing a lag screw to close down the fracture and then two additional locked screws within the plate. Periosteum was not disturbed.   I passed a K wire into the second metatarsal and third metatarsal but was unable to achieve reduction as it had been several weeks from injury. Consequently I made an incision that would enable open direct access to the 2nd and 3rd metatarsal shaft fractures.  Through the incision a direct reduction was achieved transiently using a clamp  to position.  The 2nd metatarsal shaft fracture was then reduced and the K-wire advanced proximally across the fracture site and then across the metatarsal base into the intermediate cuneiform to secure fixation.  In similar fashion, attention was then turned to the 3rd metatarsal shaft fracture.  Here again, a pin was placed retrograde using the open incision to achieve reduction passing through the shaft up into the cuneiforms.  The fourth shaft was manipulated into improved alignment. The fifth was in good position and required no extra treatment. Final images consisting of AP, oblique, and lateral foot films all showed excellent reduction and appropriate hardware placement, trajectory and length.  There were no complications during the procedure.  Wounds were irrigated thoroughly and closed in standard layered fashion with Vicryl and nylon.  Sterile gently compressive dressing was applied and then posterior and stirrup splint with pins were cut off and pin caps applied as well.  I did have a PA assistant  throughout, which was necessary to provide the traction to restore length and alignment for the fracture and joint reductions.  PROGNOSIS:  The patient will be nonweightbearing on pharmacologic DVT prophylaxis.  We will plan to see back in the office for new x-rays and removal of sutures in 2 weeks with the expectation for removal of pins at 6-8 weeks depending upon  progress and the wounds with subsequent protected weight bearing in a CAM boot. Increased risk of midfoot stiffness and arthritis, as well as infection given prior history of infection and immunosuppression.

## 2020-07-28 NOTE — Anesthesia Procedure Notes (Signed)
Procedure Name: LMA Insertion Date/Time: 07/28/2020 8:33 AM Performed by: Barrington Ellison, CRNA Pre-anesthesia Checklist: Patient identified, Emergency Drugs available, Suction available and Patient being monitored Patient Re-evaluated:Patient Re-evaluated prior to induction Oxygen Delivery Method: Circle System Utilized Preoxygenation: Pre-oxygenation with 100% oxygen Induction Type: IV induction Ventilation: Mask ventilation without difficulty LMA: LMA inserted LMA Size: 4.0 Number of attempts: 1 Placement Confirmation: positive ETCO2 Tube secured with: Tape Dental Injury: Teeth and Oropharynx as per pre-operative assessment

## 2020-07-28 NOTE — Anesthesia Postprocedure Evaluation (Signed)
Anesthesia Post Note  Patient: Tracy [name]  Procedure(s) Performed: OPEN REDUCTION INTERNAL FIXATION (ORIF) METATARSAL (TOE) FRACTURE (Left: Toe) PERCUTANEOUS PINNING  2ND AND 3RD METATARSALS (Left) OPEN REDUCTION INTERNAL FIXATION (ORIF) FOOT LISFRANC FRACTURE (Left: Foot)     Patient location during evaluation: PACU Anesthesia Type: General Level of consciousness: awake and alert Pain management: pain level controlled Vital Signs Assessment: post-procedure vital signs reviewed and stable Respiratory status: spontaneous breathing, nonlabored ventilation and respiratory function stable Cardiovascular status: blood pressure returned to baseline and stable Postop Assessment: no apparent nausea or vomiting Anesthetic complications: no   No notable events documented.  Last Vitals:  Vitals:   07/28/20 1310 07/28/20 1325  BP: 138/76 137/76  Pulse: 66 66  Resp: 17 14  Temp:  36.9 C  SpO2: 98% 99%    Last Pain:  Vitals:   07/28/20 1325  TempSrc:   PainSc: 0-No pain    LLE Motor Response: No movement due to regional block (07/28/20 1325) LLE Sensation: Numbness (07/28/20 1325)          Lynda Rainwater

## 2020-07-28 NOTE — Anesthesia Preprocedure Evaluation (Signed)
Anesthesia Evaluation  Patient identified by MRN, date of birth, ID band Patient awake    Reviewed: Allergy & Precautions, NPO status , Patient's Chart, lab work & pertinent test results  History of Anesthesia Complications Negative for: history of anesthetic complications  Airway Mallampati: II  TM Distance: >3 FB Neck ROM: Full    Dental no notable dental hx.    Pulmonary neg pulmonary ROS,    Pulmonary exam normal breath sounds clear to auscultation       Cardiovascular hypertension, Pt. on medications Normal cardiovascular exam Rhythm:Regular Rate:Normal     Neuro/Psych Seizures -,  PSYCHIATRIC DISORDERS Anxiety Depression    GI/Hepatic negative GI ROS, Neg liver ROS,   Endo/Other  diabetesHypothyroidism   Renal/GU Renal disease     Musculoskeletal negative musculoskeletal ROS (+)   Abdominal (+) + obese,   Peds  Hematology  (+) anemia ,   Anesthesia Other Findings   Reproductive/Obstetrics negative OB ROS                             Anesthesia Physical  Anesthesia Plan  ASA: III  Anesthesia Plan: General   Post-op Pain Management:  Regional for Post-op pain   Induction: Intravenous  PONV Risk Score and Plan: 3 and Ondansetron, Dexamethasone, Midazolam and Treatment may vary due to age or medical condition  Airway Management Planned: LMA  Additional Equipment:   Intra-op Plan:   Post-operative Plan: Extubation in OR  Informed Consent: I have reviewed the patients History and Physical, chart, labs and discussed the procedure including the risks, benefits and alternatives for the proposed anesthesia with the patient or authorized representative who has indicated his/her understanding and acceptance.     Dental advisory given  Plan Discussed with: CRNA  Anesthesia Plan Comments:         Anesthesia Quick Evaluation

## 2020-08-01 NOTE — OR Nursing (Signed)
After reviewing the x-rays and talking with the sales rep. the implant record  was corrected.

## 2020-08-02 ENCOUNTER — Encounter (HOSPITAL_COMMUNITY): Payer: Self-pay | Admitting: Orthopedic Surgery

## 2020-08-07 ENCOUNTER — Ambulatory Visit: Payer: BC Managed Care – PPO

## 2020-08-09 ENCOUNTER — Other Ambulatory Visit: Payer: Self-pay

## 2020-08-09 ENCOUNTER — Ambulatory Visit (INDEPENDENT_AMBULATORY_CARE_PROVIDER_SITE_OTHER): Payer: BC Managed Care – PPO | Admitting: *Deleted

## 2020-08-09 DIAGNOSIS — Z308 Encounter for other contraceptive management: Secondary | ICD-10-CM

## 2020-08-09 MED ORDER — MEDROXYPROGESTERONE ACETATE 150 MG/ML IM SUSP
150.0000 mg | Freq: Once | INTRAMUSCULAR | Status: AC
  Administered 2020-08-09: 150 mg via INTRAMUSCULAR

## 2020-08-09 NOTE — Progress Notes (Signed)
   NURSE VISIT- INJECTION  SUBJECTIVE:  Tracy [name] is a 35 y.o. [id number] female here for a Depo Provera for contraception/period management. She is a GYN patient.   OBJECTIVE:  LMP  (LMP Unknown)   Appears well, in no apparent distress  Injection administered in: Right deltoid  Meds ordered this encounter  Medications   medroxyPROGESTERone (DEPO-PROVERA) injection 150 mg    ASSESSMENT: GYN patient Depo Provera for contraception/period management PLAN: Follow-up: in 11-13 weeks for next Depo   Levy Pupa  08/09/2020 10:07 AM

## 2020-08-11 DIAGNOSIS — Z940 Kidney transplant status: Secondary | ICD-10-CM | POA: Diagnosis not present

## 2020-08-11 DIAGNOSIS — Z48288 Encounter for aftercare following multiple organ transplant: Secondary | ICD-10-CM | POA: Diagnosis not present

## 2020-08-11 DIAGNOSIS — Z95828 Presence of other vascular implants and grafts: Secondary | ICD-10-CM | POA: Diagnosis not present

## 2020-08-11 DIAGNOSIS — Z79899 Other long term (current) drug therapy: Secondary | ICD-10-CM | POA: Diagnosis not present

## 2020-08-11 DIAGNOSIS — Z9483 Pancreas transplant status: Secondary | ICD-10-CM | POA: Diagnosis not present

## 2020-08-16 DIAGNOSIS — S92332D Displaced fracture of third metatarsal bone, left foot, subsequent encounter for fracture with routine healing: Secondary | ICD-10-CM | POA: Diagnosis not present

## 2020-08-16 DIAGNOSIS — S92312D Displaced fracture of first metatarsal bone, left foot, subsequent encounter for fracture with routine healing: Secondary | ICD-10-CM | POA: Diagnosis not present

## 2020-08-16 DIAGNOSIS — S92342D Displaced fracture of fourth metatarsal bone, left foot, subsequent encounter for fracture with routine healing: Secondary | ICD-10-CM | POA: Diagnosis not present

## 2020-08-16 DIAGNOSIS — S92352D Displaced fracture of fifth metatarsal bone, left foot, subsequent encounter for fracture with routine healing: Secondary | ICD-10-CM | POA: Diagnosis not present

## 2020-08-27 ENCOUNTER — Other Ambulatory Visit: Payer: Self-pay | Admitting: Adult Health

## 2020-09-06 DIAGNOSIS — S92312D Displaced fracture of first metatarsal bone, left foot, subsequent encounter for fracture with routine healing: Secondary | ICD-10-CM | POA: Diagnosis not present

## 2020-09-06 DIAGNOSIS — S92332D Displaced fracture of third metatarsal bone, left foot, subsequent encounter for fracture with routine healing: Secondary | ICD-10-CM | POA: Diagnosis not present

## 2020-09-06 DIAGNOSIS — S92352D Displaced fracture of fifth metatarsal bone, left foot, subsequent encounter for fracture with routine healing: Secondary | ICD-10-CM | POA: Diagnosis not present

## 2020-09-06 DIAGNOSIS — S92342D Displaced fracture of fourth metatarsal bone, left foot, subsequent encounter for fracture with routine healing: Secondary | ICD-10-CM | POA: Diagnosis not present

## 2020-09-13 DIAGNOSIS — Z4822 Encounter for aftercare following kidney transplant: Secondary | ICD-10-CM | POA: Diagnosis not present

## 2020-09-13 DIAGNOSIS — B258 Other cytomegaloviral diseases: Secondary | ICD-10-CM | POA: Diagnosis not present

## 2020-09-13 DIAGNOSIS — Z9483 Pancreas transplant status: Secondary | ICD-10-CM | POA: Diagnosis not present

## 2020-09-13 DIAGNOSIS — E1022 Type 1 diabetes mellitus with diabetic chronic kidney disease: Secondary | ICD-10-CM | POA: Diagnosis not present

## 2020-09-13 DIAGNOSIS — R197 Diarrhea, unspecified: Secondary | ICD-10-CM | POA: Diagnosis not present

## 2020-09-13 DIAGNOSIS — D8989 Other specified disorders involving the immune mechanism, not elsewhere classified: Secondary | ICD-10-CM | POA: Diagnosis not present

## 2020-09-13 DIAGNOSIS — Z940 Kidney transplant status: Secondary | ICD-10-CM | POA: Diagnosis not present

## 2020-09-13 DIAGNOSIS — I951 Orthostatic hypotension: Secondary | ICD-10-CM | POA: Diagnosis not present

## 2020-09-13 DIAGNOSIS — Z882 Allergy status to sulfonamides status: Secondary | ICD-10-CM | POA: Diagnosis not present

## 2020-09-13 DIAGNOSIS — N390 Urinary tract infection, site not specified: Secondary | ICD-10-CM | POA: Diagnosis not present

## 2020-09-13 DIAGNOSIS — N186 End stage renal disease: Secondary | ICD-10-CM | POA: Diagnosis not present

## 2020-09-13 DIAGNOSIS — Z880 Allergy status to penicillin: Secondary | ICD-10-CM | POA: Diagnosis not present

## 2020-09-18 ENCOUNTER — Inpatient Hospital Stay
Admission: AD | Admit: 2020-09-18 | Payer: BC Managed Care – PPO | Source: Ambulatory Visit | Admitting: Orthopedic Surgery

## 2020-09-18 DIAGNOSIS — S92352D Displaced fracture of fifth metatarsal bone, left foot, subsequent encounter for fracture with routine healing: Secondary | ICD-10-CM | POA: Diagnosis not present

## 2020-09-18 DIAGNOSIS — S92332D Displaced fracture of third metatarsal bone, left foot, subsequent encounter for fracture with routine healing: Secondary | ICD-10-CM | POA: Diagnosis not present

## 2020-09-18 DIAGNOSIS — S92342D Displaced fracture of fourth metatarsal bone, left foot, subsequent encounter for fracture with routine healing: Secondary | ICD-10-CM | POA: Diagnosis not present

## 2020-09-18 DIAGNOSIS — S92312D Displaced fracture of first metatarsal bone, left foot, subsequent encounter for fracture with routine healing: Secondary | ICD-10-CM | POA: Diagnosis not present

## 2020-09-19 ENCOUNTER — Inpatient Hospital Stay (HOSPITAL_COMMUNITY)
Admission: AD | Admit: 2020-09-19 | Discharge: 2020-09-23 | DRG: 464 | Disposition: A | Discharge status: Home Health | Payer: BC Managed Care – PPO | Source: Ambulatory Visit | Attending: Orthopedic Surgery | Admitting: Orthopedic Surgery

## 2020-09-19 ENCOUNTER — Encounter (HOSPITAL_COMMUNITY)
Admission: AD | Disposition: A | Discharge status: Home Health | Payer: Self-pay | Source: Ambulatory Visit | Attending: Orthopedic Surgery

## 2020-09-19 ENCOUNTER — Inpatient Hospital Stay (HOSPITAL_COMMUNITY): Payer: BC Managed Care – PPO | Admitting: Certified Registered Nurse Anesthetist

## 2020-09-19 ENCOUNTER — Encounter (HOSPITAL_COMMUNITY): Payer: Self-pay | Admitting: Orthopedic Surgery

## 2020-09-19 ENCOUNTER — Inpatient Hospital Stay (HOSPITAL_COMMUNITY): Payer: BC Managed Care – PPO

## 2020-09-19 ENCOUNTER — Other Ambulatory Visit: Payer: Self-pay

## 2020-09-19 DIAGNOSIS — D62 Acute posthemorrhagic anemia: Secondary | ICD-10-CM | POA: Diagnosis not present

## 2020-09-19 DIAGNOSIS — E1069 Type 1 diabetes mellitus with other specified complication: Secondary | ICD-10-CM | POA: Diagnosis not present

## 2020-09-19 DIAGNOSIS — M86272 Subacute osteomyelitis, left ankle and foot: Secondary | ICD-10-CM | POA: Diagnosis not present

## 2020-09-19 DIAGNOSIS — I96 Gangrene, not elsewhere classified: Secondary | ICD-10-CM | POA: Diagnosis present

## 2020-09-19 DIAGNOSIS — Z880 Allergy status to penicillin: Secondary | ICD-10-CM | POA: Diagnosis not present

## 2020-09-19 DIAGNOSIS — D638 Anemia in other chronic diseases classified elsewhere: Secondary | ICD-10-CM | POA: Diagnosis not present

## 2020-09-19 DIAGNOSIS — Z9483 Pancreas transplant status: Secondary | ICD-10-CM

## 2020-09-19 DIAGNOSIS — Z7952 Long term (current) use of systemic steroids: Secondary | ICD-10-CM | POA: Diagnosis not present

## 2020-09-19 DIAGNOSIS — D649 Anemia, unspecified: Secondary | ICD-10-CM | POA: Diagnosis present

## 2020-09-19 DIAGNOSIS — M869 Osteomyelitis, unspecified: Secondary | ICD-10-CM | POA: Diagnosis not present

## 2020-09-19 DIAGNOSIS — D84821 Immunodeficiency due to drugs: Secondary | ICD-10-CM | POA: Diagnosis not present

## 2020-09-19 DIAGNOSIS — I10 Essential (primary) hypertension: Secondary | ICD-10-CM | POA: Diagnosis present

## 2020-09-19 DIAGNOSIS — Z20822 Contact with and (suspected) exposure to covid-19: Secondary | ICD-10-CM | POA: Diagnosis present

## 2020-09-19 DIAGNOSIS — Z79899 Other long term (current) drug therapy: Secondary | ICD-10-CM | POA: Diagnosis not present

## 2020-09-19 DIAGNOSIS — E039 Hypothyroidism, unspecified: Secondary | ICD-10-CM | POA: Diagnosis not present

## 2020-09-19 DIAGNOSIS — E1052 Type 1 diabetes mellitus with diabetic peripheral angiopathy with gangrene: Secondary | ICD-10-CM | POA: Diagnosis not present

## 2020-09-19 DIAGNOSIS — I739 Peripheral vascular disease, unspecified: Secondary | ICD-10-CM | POA: Diagnosis not present

## 2020-09-19 DIAGNOSIS — M86072 Acute hematogenous osteomyelitis, left ankle and foot: Secondary | ICD-10-CM

## 2020-09-19 DIAGNOSIS — T8469XA Infection and inflammatory reaction due to internal fixation device of other site, initial encounter: Principal | ICD-10-CM | POA: Diagnosis present

## 2020-09-19 DIAGNOSIS — L02612 Cutaneous abscess of left foot: Secondary | ICD-10-CM | POA: Diagnosis not present

## 2020-09-19 DIAGNOSIS — F331 Major depressive disorder, recurrent, moderate: Secondary | ICD-10-CM | POA: Diagnosis present

## 2020-09-19 DIAGNOSIS — R600 Localized edema: Secondary | ICD-10-CM | POA: Diagnosis not present

## 2020-09-19 DIAGNOSIS — L03116 Cellulitis of left lower limb: Secondary | ICD-10-CM | POA: Diagnosis present

## 2020-09-19 DIAGNOSIS — Z794 Long term (current) use of insulin: Secondary | ICD-10-CM | POA: Diagnosis not present

## 2020-09-19 DIAGNOSIS — M79672 Pain in left foot: Secondary | ICD-10-CM | POA: Diagnosis not present

## 2020-09-19 DIAGNOSIS — Z940 Kidney transplant status: Secondary | ICD-10-CM

## 2020-09-19 DIAGNOSIS — Z949 Transplanted organ and tissue status, unspecified: Secondary | ICD-10-CM

## 2020-09-19 DIAGNOSIS — S92352A Displaced fracture of fifth metatarsal bone, left foot, initial encounter for closed fracture: Secondary | ICD-10-CM | POA: Diagnosis not present

## 2020-09-19 DIAGNOSIS — Y792 Prosthetic and other implants, materials and accessory orthopedic devices associated with adverse incidents: Secondary | ICD-10-CM | POA: Diagnosis present

## 2020-09-19 DIAGNOSIS — Z981 Arthrodesis status: Secondary | ICD-10-CM | POA: Diagnosis not present

## 2020-09-19 DIAGNOSIS — M86172 Other acute osteomyelitis, left ankle and foot: Secondary | ICD-10-CM | POA: Diagnosis present

## 2020-09-19 DIAGNOSIS — M7989 Other specified soft tissue disorders: Secondary | ICD-10-CM | POA: Diagnosis not present

## 2020-09-19 DIAGNOSIS — F419 Anxiety disorder, unspecified: Secondary | ICD-10-CM | POA: Diagnosis present

## 2020-09-19 HISTORY — PX: I & D EXTREMITY: SHX5045

## 2020-09-19 LAB — BASIC METABOLIC PANEL
Anion gap: 9 (ref 5–15)
BUN: 12 mg/dL (ref 6–20)
CO2: 22 mmol/L (ref 22–32)
Calcium: 9.4 mg/dL (ref 8.9–10.3)
Chloride: 105 mmol/L (ref 98–111)
Creatinine, Ser: 1.28 mg/dL — ABNORMAL HIGH (ref 0.44–1.00)
GFR, Estimated: 56 mL/min — ABNORMAL LOW (ref >60)
Glucose, Bld: 119 mg/dL — ABNORMAL HIGH (ref 70–99)
Potassium: 4.3 mmol/L (ref 3.5–5.1)
Sodium: 136 mmol/L (ref 135–145)

## 2020-09-19 LAB — GLUCOSE, CAPILLARY: Glucose-Capillary: 99 mg/dL (ref 70–99)

## 2020-09-19 LAB — C-REACTIVE PROTEIN: CRP: 25.7 mg/dL — ABNORMAL HIGH (ref <1.0)

## 2020-09-19 LAB — SARS CORONAVIRUS 2 BY RT PCR (HOSPITAL ORDER, PERFORMED IN ~~LOC~~ HOSPITAL LAB): SARS Coronavirus 2: NEGATIVE

## 2020-09-19 LAB — POCT PREGNANCY, URINE: Preg Test, Ur: NEGATIVE

## 2020-09-19 LAB — HEMOGLOBIN A1C
Hgb A1c MFr Bld: 6 % — ABNORMAL HIGH (ref 4.8–5.6)
Mean Plasma Glucose: 125.5 mg/dL

## 2020-09-19 LAB — SEDIMENTATION RATE: Sed Rate: 93 mm/hr — ABNORMAL HIGH (ref 0–22)

## 2020-09-19 SURGERY — IRRIGATION AND DEBRIDEMENT EXTREMITY
Anesthesia: General | Site: Foot | Laterality: Left

## 2020-09-19 MED ORDER — SCOPOLAMINE 1 MG/3DAYS TD PT72
MEDICATED_PATCH | TRANSDERMAL | Status: AC
  Administered 2020-09-19: 1.5 mg via TRANSDERMAL
  Filled 2020-09-19: qty 1

## 2020-09-19 MED ORDER — VANCOMYCIN HCL 1000 MG IV SOLR
INTRAVENOUS | Status: DC | PRN
  Administered 2020-09-19: 1000 mg via INTRAVENOUS

## 2020-09-19 MED ORDER — MIDAZOLAM HCL 2 MG/2ML IJ SOLN
INTRAMUSCULAR | Status: DC | PRN
  Administered 2020-09-19 (×2): 1 mg via INTRAVENOUS

## 2020-09-19 MED ORDER — VANCOMYCIN HCL 1000 MG IV SOLR
INTRAVENOUS | Status: AC
  Filled 2020-09-19: qty 1000

## 2020-09-19 MED ORDER — HYDROMORPHONE HCL 1 MG/ML IJ SOLN
INTRAMUSCULAR | Status: AC
  Filled 2020-09-19: qty 1

## 2020-09-19 MED ORDER — POTASSIUM CHLORIDE IN NACL 20-0.9 MEQ/L-% IV SOLN
INTRAVENOUS | Status: DC
Start: 2020-09-19 — End: 2020-09-22
  Filled 2020-09-19 (×2): qty 1000

## 2020-09-19 MED ORDER — OXYCODONE HCL 5 MG PO TABS: 10.0000 mg | ORAL_TABLET | ORAL | Status: DC | PRN

## 2020-09-19 MED ORDER — ACETAMINOPHEN 325 MG PO TABS: 325.0000 mg | ORAL_TABLET | Freq: Four times a day (QID) | ORAL | Status: DC | PRN

## 2020-09-19 MED ORDER — ACETAMINOPHEN 10 MG/ML IV SOLN
1000.0000 mg | Freq: Once | INTRAVENOUS | Status: AC
  Administered 2020-09-19: 1000 mg via INTRAVENOUS

## 2020-09-19 MED ORDER — FENTANYL CITRATE (PF) 100 MCG/2ML IJ SOLN
25.0000 ug | INTRAMUSCULAR | Status: DC | PRN
  Administered 2020-09-19: 50 ug via INTRAVENOUS

## 2020-09-19 MED ORDER — 0.9 % SODIUM CHLORIDE (POUR BTL) OPTIME
TOPICAL | Status: DC | PRN
  Administered 2020-09-19: 1000 mL

## 2020-09-19 MED ORDER — SODIUM CHLORIDE 0.9 % IV SOLN
2.0000 g | Freq: Once | INTRAVENOUS | Status: AC
  Administered 2020-09-19: 2 g via INTRAVENOUS
  Filled 2020-09-19: qty 2

## 2020-09-19 MED ORDER — FENTANYL CITRATE (PF) 250 MCG/5ML IJ SOLN
INTRAMUSCULAR | Status: AC
  Filled 2020-09-19: qty 5

## 2020-09-19 MED ORDER — DEXAMETHASONE SODIUM PHOSPHATE 10 MG/ML IJ SOLN
INTRAMUSCULAR | Status: AC
  Filled 2020-09-19: qty 1

## 2020-09-19 MED ORDER — MYCOPHENOLATE SODIUM 180 MG PO TBEC
720.0000 mg | DELAYED_RELEASE_TABLET | Freq: Two times a day (BID) | ORAL | Status: DC
  Administered 2020-09-19 – 2020-09-23 (×9): 720 mg via ORAL
  Filled 2020-09-19 (×11): qty 4

## 2020-09-19 MED ORDER — VANCOMYCIN HCL 750 MG/150ML IV SOLN
750.0000 mg | INTRAVENOUS | Status: DC
  Administered 2020-09-20: 750 mg via INTRAVENOUS
  Filled 2020-09-19 (×2): qty 150

## 2020-09-19 MED ORDER — DEXTROSE 5 % IV SOLN
INTRAVENOUS | Status: DC | PRN
  Administered 2020-09-19: 2 g via INTRAVENOUS

## 2020-09-19 MED ORDER — SCOPOLAMINE 1 MG/3DAYS TD PT72
1.0000 | MEDICATED_PATCH | TRANSDERMAL | Status: DC
  Administered 2020-09-22: 1.5 mg via TRANSDERMAL
  Filled 2020-09-19: qty 1

## 2020-09-19 MED ORDER — TACROLIMUS 1 MG PO CAPS
2.0000 mg | ORAL_CAPSULE | Freq: Two times a day (BID) | ORAL | Status: DC
  Administered 2020-09-19: 1 mg via ORAL
  Administered 2020-09-19 – 2020-09-21 (×4): 2 mg via ORAL
  Administered 2020-09-21: 1 mg via ORAL
  Administered 2020-09-22: 2 mg via ORAL
  Administered 2020-09-22: 1 mg via ORAL
  Administered 2020-09-23: 2 mg via ORAL
  Filled 2020-09-19 (×11): qty 2

## 2020-09-19 MED ORDER — METHOCARBAMOL 500 MG PO TABS
500.0000 mg | ORAL_TABLET | Freq: Four times a day (QID) | ORAL | Status: DC | PRN
  Administered 2020-09-19 – 2020-09-22 (×5): 500 mg via ORAL
  Filled 2020-09-19 (×4): qty 1

## 2020-09-19 MED ORDER — SODIUM CHLORIDE 0.9 % IV SOLN
2.0000 g | Freq: Two times a day (BID) | INTRAVENOUS | Status: DC
  Administered 2020-09-20 (×2): 2 g via INTRAVENOUS
  Filled 2020-09-19 (×2): qty 2

## 2020-09-19 MED ORDER — ONDANSETRON HCL 4 MG/2ML IJ SOLN
INTRAMUSCULAR | Status: AC
  Filled 2020-09-19: qty 2

## 2020-09-19 MED ORDER — DEXAMETHASONE SODIUM PHOSPHATE 10 MG/ML IJ SOLN
INTRAMUSCULAR | Status: DC | PRN
  Administered 2020-09-19: 4 mg via INTRAVENOUS

## 2020-09-19 MED ORDER — TRAMADOL HCL 50 MG PO TABS: 50.0000 mg | ORAL_TABLET | Freq: Three times a day (TID) | ORAL | Status: DC | PRN

## 2020-09-19 MED ORDER — ACETAMINOPHEN 500 MG PO TABS
1000.0000 mg | ORAL_TABLET | Freq: Three times a day (TID) | ORAL | Status: AC
  Administered 2020-09-19 – 2020-09-20 (×4): 1000 mg via ORAL
  Filled 2020-09-19 (×4): qty 2

## 2020-09-19 MED ORDER — TRAZODONE HCL 50 MG PO TABS
50.0000 mg | ORAL_TABLET | Freq: Every evening | ORAL | Status: DC | PRN
  Administered 2020-09-20 – 2020-09-22 (×3): 50 mg via ORAL
  Filled 2020-09-19 (×3): qty 1

## 2020-09-19 MED ORDER — CEFAZOLIN SODIUM-DEXTROSE 2-4 GM/100ML-% IV SOLN
INTRAVENOUS | Status: AC
  Filled 2020-09-19: qty 100

## 2020-09-19 MED ORDER — ONDANSETRON HCL 4 MG/2ML IJ SOLN: 4.0000 mg | Freq: Four times a day (QID) | INTRAMUSCULAR | Status: DC | PRN

## 2020-09-19 MED ORDER — FENTANYL CITRATE (PF) 100 MCG/2ML IJ SOLN
INTRAMUSCULAR | Status: AC
  Filled 2020-09-19: qty 2

## 2020-09-19 MED ORDER — METHOCARBAMOL 500 MG PO TABS
ORAL_TABLET | ORAL | Status: AC
  Filled 2020-09-19: qty 1

## 2020-09-19 MED ORDER — LACTATED RINGERS IV SOLN: INTRAVENOUS | Status: DC

## 2020-09-19 MED ORDER — PROPOFOL 10 MG/ML IV BOLUS
INTRAVENOUS | Status: AC
  Filled 2020-09-19: qty 20

## 2020-09-19 MED ORDER — DOCUSATE SODIUM 100 MG PO CAPS
100.0000 mg | ORAL_CAPSULE | Freq: Two times a day (BID) | ORAL | Status: DC
  Administered 2020-09-19 – 2020-09-23 (×9): 100 mg via ORAL
  Filled 2020-09-19 (×9): qty 1

## 2020-09-19 MED ORDER — CHLORHEXIDINE GLUCONATE 0.12 % MT SOLN: 15.0000 mL | Freq: Once | OROMUCOSAL | Status: DC

## 2020-09-19 MED ORDER — METHOCARBAMOL 1000 MG/10ML IJ SOLN
500.0000 mg | Freq: Four times a day (QID) | INTRAVENOUS | Status: DC | PRN
  Filled 2020-09-19 (×3): qty 5

## 2020-09-19 MED ORDER — ENOXAPARIN SODIUM 40 MG/0.4ML IJ SOSY
40.0000 mg | PREFILLED_SYRINGE | INTRAMUSCULAR | Status: DC
  Administered 2020-09-20 – 2020-09-23 (×3): 40 mg via SUBCUTANEOUS
  Filled 2020-09-19 (×3): qty 0.4

## 2020-09-19 MED ORDER — VANCOMYCIN HCL 1500 MG/300ML IV SOLN
1500.0000 mg | Freq: Once | INTRAVENOUS | Status: AC
  Administered 2020-09-19: 1500 mg via INTRAVENOUS
  Filled 2020-09-19: qty 300

## 2020-09-19 MED ORDER — HYDROMORPHONE HCL 1 MG/ML IJ SOLN
0.5000 mg | INTRAMUSCULAR | Status: DC | PRN
  Administered 2020-09-19 – 2020-09-22 (×13): 1 mg via INTRAVENOUS
  Filled 2020-09-19 (×13): qty 1

## 2020-09-19 MED ORDER — FENTANYL CITRATE (PF) 250 MCG/5ML IJ SOLN
INTRAMUSCULAR | Status: DC | PRN
  Administered 2020-09-19 (×3): 50 ug via INTRAVENOUS

## 2020-09-19 MED ORDER — METOCLOPRAMIDE HCL 5 MG PO TABS: 5.0000 mg | ORAL_TABLET | Freq: Three times a day (TID) | ORAL | Status: DC | PRN

## 2020-09-19 MED ORDER — OXYCODONE HCL 5 MG PO TABS: 5.0000 mg | ORAL_TABLET | ORAL | Status: DC | PRN

## 2020-09-19 MED ORDER — MIDAZOLAM HCL 2 MG/2ML IJ SOLN
INTRAMUSCULAR | Status: AC
  Filled 2020-09-19: qty 2

## 2020-09-19 MED ORDER — ONDANSETRON HCL 4 MG/2ML IJ SOLN
INTRAMUSCULAR | Status: DC | PRN
  Administered 2020-09-19: 4 mg via INTRAVENOUS

## 2020-09-19 MED ORDER — METOCLOPRAMIDE HCL 5 MG/ML IJ SOLN: 5.0000 mg | Freq: Three times a day (TID) | INTRAMUSCULAR | Status: DC | PRN

## 2020-09-19 MED ORDER — VANCOMYCIN HCL IN DEXTROSE 1-5 GM/200ML-% IV SOLN
1000.0000 mg | INTRAVENOUS | Status: DC
  Filled 2020-09-19: qty 200

## 2020-09-19 MED ORDER — PROPOFOL 10 MG/ML IV BOLUS
INTRAVENOUS | Status: DC | PRN
  Administered 2020-09-19: 200 mg via INTRAVENOUS

## 2020-09-19 MED ORDER — LIDOCAINE 2% (20 MG/ML) 5 ML SYRINGE
INTRAMUSCULAR | Status: DC | PRN
  Administered 2020-09-19: 100 mg via INTRAVENOUS

## 2020-09-19 MED ORDER — PREDNISONE 10 MG PO TABS
10.0000 mg | ORAL_TABLET | Freq: Every day | ORAL | Status: DC
  Administered 2020-09-20 – 2020-09-23 (×3): 10 mg via ORAL
  Filled 2020-09-19 (×4): qty 1

## 2020-09-19 MED ORDER — ONDANSETRON HCL 4 MG PO TABS: 4.0000 mg | ORAL_TABLET | Freq: Four times a day (QID) | ORAL | Status: DC | PRN

## 2020-09-19 MED ORDER — ORAL CARE MOUTH RINSE: 15.0000 mL | Freq: Once | OROMUCOSAL | Status: DC

## 2020-09-19 MED ORDER — PROMETHAZINE HCL 25 MG/ML IJ SOLN: 6.2500 mg | INTRAMUSCULAR | Status: DC | PRN

## 2020-09-19 MED ORDER — ACETAMINOPHEN 10 MG/ML IV SOLN
INTRAVENOUS | Status: AC
  Filled 2020-09-19: qty 100

## 2020-09-19 MED ORDER — SODIUM CHLORIDE 0.9 % IR SOLN
Status: DC | PRN
  Administered 2020-09-19 (×2): 3000 mL

## 2020-09-19 MED ORDER — SODIUM CHLORIDE 0.9 % IV SOLN
2.0000 g | INTRAVENOUS | Status: DC
  Filled 2020-09-19: qty 20

## 2020-09-19 MED ORDER — HYDROMORPHONE HCL 1 MG/ML IJ SOLN
0.2500 mg | INTRAMUSCULAR | Status: DC | PRN
  Administered 2020-09-19 (×4): 0.5 mg via INTRAVENOUS

## 2020-09-19 MED ORDER — LACTATED RINGERS IV SOLN: INTRAVENOUS | Status: DC | PRN

## 2020-09-19 SURGICAL SUPPLY — 48 items
BAG COUNTER SPONGE SURGICOUNT (BAG) IMPLANT
BAG SPNG CNTER NS LX DISP (BAG)
BNDG COHESIVE 4X5 TAN STRL (GAUZE/BANDAGES/DRESSINGS) ×2 IMPLANT
BNDG ELASTIC 4X5.8 VLCR STR LF (GAUZE/BANDAGES/DRESSINGS) ×1 IMPLANT
BNDG GAUZE ELAST 4 BULKY (GAUZE/BANDAGES/DRESSINGS) ×3 IMPLANT
BRUSH SCRUB EZ PLAIN DRY (MISCELLANEOUS) ×4 IMPLANT
CANISTER WOUND CARE 500ML ATS (WOUND CARE) ×1 IMPLANT
COVER SURGICAL LIGHT HANDLE (MISCELLANEOUS) ×4 IMPLANT
DRAPE U-SHAPE 47X51 STRL (DRAPES) ×2 IMPLANT
DRSG ADAPTIC 3X8 NADH LF (GAUZE/BANDAGES/DRESSINGS) ×2 IMPLANT
DRSG VAC ATS SM SENSATRAC (GAUZE/BANDAGES/DRESSINGS) ×1 IMPLANT
ELECT REM PT RETURN 9FT ADLT (ELECTROSURGICAL)
ELECTRODE REM PT RTRN 9FT ADLT (ELECTROSURGICAL) IMPLANT
GAUZE SPONGE 4X4 12PLY STRL (GAUZE/BANDAGES/DRESSINGS) ×2 IMPLANT
GAUZE SPONGE 4X4 12PLY STRL LF (GAUZE/BANDAGES/DRESSINGS) ×1 IMPLANT
GLOVE SRG 8 PF TXTR STRL LF DI (GLOVE) ×1 IMPLANT
GLOVE SURG ENC MOIS LTX SZ7.5 (GLOVE) ×2 IMPLANT
GLOVE SURG ENC MOIS LTX SZ8 (GLOVE) ×2 IMPLANT
GLOVE SURG UNDER POLY LF SZ7.5 (GLOVE) ×2 IMPLANT
GLOVE SURG UNDER POLY LF SZ8 (GLOVE) ×2
GLOVE SURG UNDER POLY LF SZ9 (GLOVE) ×2 IMPLANT
GOWN STRL REUS W/ TWL LRG LVL3 (GOWN DISPOSABLE) ×2 IMPLANT
GOWN STRL REUS W/ TWL XL LVL3 (GOWN DISPOSABLE) ×1 IMPLANT
GOWN STRL REUS W/TWL LRG LVL3 (GOWN DISPOSABLE) ×4
GOWN STRL REUS W/TWL XL LVL3 (GOWN DISPOSABLE) ×2
HANDPIECE INTERPULSE COAX TIP (DISPOSABLE)
KIT BASIN OR (CUSTOM PROCEDURE TRAY) ×2 IMPLANT
KIT TURNOVER KIT B (KITS) ×2 IMPLANT
MANIFOLD NEPTUNE II (INSTRUMENTS) ×2 IMPLANT
NS IRRIG 1000ML POUR BTL (IV SOLUTION) ×2 IMPLANT
PACK ORTHO EXTREMITY (CUSTOM PROCEDURE TRAY) ×2 IMPLANT
PAD ABD 8X10 STRL (GAUZE/BANDAGES/DRESSINGS) ×1 IMPLANT
PAD ARMBOARD 7.5X6 YLW CONV (MISCELLANEOUS) ×4 IMPLANT
PADDING CAST COTTON 6X4 STRL (CAST SUPPLIES) ×2 IMPLANT
SET HNDPC FAN SPRY TIP SCT (DISPOSABLE) IMPLANT
SOL PREP POV-IOD 4OZ 10% (MISCELLANEOUS) ×2 IMPLANT
SOL PREP PROV IODINE SCRUB 4OZ (MISCELLANEOUS) ×2 IMPLANT
SPLINT PLASTER CAST XFAST 5X30 (CAST SUPPLIES) IMPLANT
SPLINT PLASTER XFAST SET 5X30 (CAST SUPPLIES) ×1
SPONGE T-LAP 18X18 ~~LOC~~+RFID (SPONGE) ×2 IMPLANT
STOCKINETTE IMPERVIOUS 9X36 MD (GAUZE/BANDAGES/DRESSINGS) IMPLANT
SUT PDS AB 2-0 CT1 27 (SUTURE) IMPLANT
TOWEL GREEN STERILE (TOWEL DISPOSABLE) ×4 IMPLANT
TOWEL GREEN STERILE FF (TOWEL DISPOSABLE) ×2 IMPLANT
TUBE CONNECTING 12X1/4 (SUCTIONS) ×2 IMPLANT
UNDERPAD 30X36 HEAVY ABSORB (UNDERPADS AND DIAPERS) ×2 IMPLANT
WATER STERILE IRR 1000ML POUR (IV SOLUTION) ×2 IMPLANT
YANKAUER SUCT BULB TIP NO VENT (SUCTIONS) ×2 IMPLANT

## 2020-09-19 NOTE — Transfer of Care (Signed)
Immediate Anesthesia Transfer of Care Note  Patient: Tracy [name]  Procedure(s) Performed: IRRIGATION AND DEBRIDEMENT EXTREMITY (Left: Foot)  Patient Location: PACU  Anesthesia Type:General  Level of Consciousness: drowsy  Airway & Oxygen Therapy: Patient Spontanous Breathing and Patient connected to face mask oxygen  Post-op Assessment: Report given to RN and Post -op Vital signs reviewed and stable  Post vital signs: Reviewed and stable  Last Vitals:  Vitals Value Taken Time  BP 131/72 09/19/20 0940  Temp    Pulse 91 09/19/20 0942  Resp 14 09/19/20 0942  SpO2 100 % 09/19/20 0942  Vitals shown include unvalidated device data.  Last Pain:  Vitals:   09/19/20 0619  TempSrc: Oral         Complications: No notable events documented.

## 2020-09-19 NOTE — Consult Note (Signed)
[facility] for Infectious Diseases                                                                                        Patient Identification: Patient Name: Tracy [name] MRN: [id number] [place] Date: 09/19/2020  5:52 AM Today's Date: 09/19/2020 Reason for consult: Hardware associated bone infection Requesting provider: Alt[facility]e [place]   Active Problems:   Cellulitis of left foot   Antibiotics:  Vancomycin 8/9- curret Ceftriaxone 8/9-current  Lines/Tubes: PIVs   Assessment Left ankle infection associated with hardware/left foot abscess/Osteomyelitis  s/p  left ORIF and pinning Lisfranc fracture 07/28/2020  OR notes reviewed. Discussed with Orthopedics with concerns for osteomyelitis OR cultures 09/19/20 growing Staph aureus   Type 1 DM combined kidney and pancreatic transplant in 2017   Comments: I spoke with her transplant surgeon at [facility] regarding whether [place] to use her port in her rt chest for IV abtx. He said there is no restriction of the use of port for IV abtx as long as proper aseptic precautions are taken and patient is aware about chances of infection. Fortunately, she will not need another PICC line for her IV abtx  Recommendations  Will switch cefepime to cefazolin pending identification of Staph aureus  Continue vancomycin, pharmacy to dose Following sensitivities Planned to go to OR tomorrow for second look and repeat I and D.  Can use port for IV abtx per her Transplant surgeon   Rest of the management as per the primary team. Please call with questions or concerns.  Thank you for the consult  Rosiland Oz, MD Infectious Disease Physician [facility] Infectious [facility] [address] [place], [place] [address] Phone: [phone]  Fax:  [phone]  __________________________________________________________________________________________________________ HPI and Hospital Course: History taken from patient and chart review.  Mother at bedside.  35 year old female with PMH of anxiety, depression, Type 1 DM, hypertension, hypothyroidism, combined kidney and pancreatic transplant in 2017 in the setting of Type 1 DM and ? Infection, s/p  left ORIF and pinning Lisfranc fracture 07/28/2020 who is directly admitted to [facility] on 8/9 for concern of left ankle infection.  Patient had a left ankle fracture in June while horse riding and had left ORIF and pinning on 07/18/2020 by Dr. Marcelino Scot.  She was doing well postop until last Thursday when she started having pain/tenderness and redness in her left foot.  She was advised to take [place] by Dr. Marcelino Scot which was started from yesterday evening.  She denies any fevers, chills, sweats.  Denies any nausea, vomiting, abdominal pain or diarrhea.  Denies any chest pain, cough, shortness of breath.  She is currently taking nitrofurantoin for her UTI as prescribed by her urologist.  She denies smoking, alcohol, IV drug use.  She tells me she had a combined kidney and pancreatic transplant in 2017 for a kidney infection in the setting of history of type 1 diabetes since age of 7.   Pins were pulled on 8/8 at Dr [facility] [place] office  and patient was started on [place].  He is status post surgical intervention by Ortho and  cultures have been sent.  Patient has been started on vancomycin and ceftriaxone..  No operative notes available as of now  ROS: General- Denies fever, chills, loss of appetite and loss of weight HEENT - Denies headache, blurry vision, neck pain, sinus pain Chest - Denies any chest pain, SOB or cough CVS- Denies any dizziness/lightheadedness, syncopal attacks, palpitations Abdomen- Denies any nausea, vomiting, abdominal pain, hematochezia and diarrhea Neuro - Denies any weakness, numbness, tingling  sensation Psych - Denies any changes in mood irritability or depressive symptoms GU- Denies any burning, dysuria, hematuria or increased frequency of urination Skin - denies any rashes/lesions MSK - denies any joint pain/swelling or restricted ROM   Past Medical History:  Diagnosis Date   Abscess of right thigh    Anemia    presently on iron supplement   Anxiety    ASCUS with positive high risk human papillomavirus of vagina 05/01/2020   04/2020 ASCUS +HPV 16 and other will get Colpo    Atypical squamous cell changes of undetermined significance (ASCUS) on vaginal cytology with positive high risk human papilloma virus (HPV) 09/05/2016   Get colpo   Chronic kidney disease    on dialysis T, Th, Sat   CMV (cytomegalovirus infection) ([place])    Depression    Detached retina    Diabetes mellitus without complication ([place])    History of organ transplantation 08/18/2015   HSV-1 (herpes simplex virus 1) infection    HSV-2 (herpes simplex virus 2) infection    Hypertension    Hypothyroidism    Irregular periods 07/05/2014   Neuropathy    in feet   Seizures ([place]) 01/2014   ? due to blood sugar   Thyroid enlargement    "not on medication at this time"   Urinary tract infection    hx of   Vaginal discharge 07/05/2014   Vaginal Pap smear, abnormal    Yeast infection    took diflucan saturday    Past Surgical History:  Procedure Laterality Date   APPLICATION OF A-CELL OF EXTREMITY Right 10/03/2014   Procedure: APPLICATION OF A-CELL OF RIGHT UPPER THIGH WOUND;  Surgeon: Renette Butters, MD;  Location: [place];  Service: Orthopedics;  Laterality: Right;   APPLICATION OF WOUND VAC Right 10/03/2014   Procedure: APPLICATION OF WOUND VAC RIGHT THIGH;  Surgeon: Renette Butters, MD;  Location: [place];  Service: Orthopedics;  Laterality: Right;   AV FISTULA PLACEMENT Left 09/01/2014   Procedure: BRACHIOCEPHALIC ARTERIOVENOUS (AV) FISTULA CREATION;  Surgeon: Conrad Gordon, MD;  Location: [place];   Service: Vascular;  Laterality: Left;   CHOLECYSTECTOMY     COMBINED KIDNEY-PANCREAS TRANSPLANT  05/15/15   I & D EXTREMITY Right 08/14/2014   Procedure: IRRIGATION AND DEBRIDEMENT EXTREMITY WITH MUSCLE BIOPSY;  Surgeon: Roseanne Kaufman, MD;  Location: [place];  Service: Orthopedics;  Laterality: Right;   I & D EXTREMITY Right 08/20/2014   Procedure:  I AND D LEG / THIGH ABSCESS AND MANIPULATION OF RIGHT ELBOW.;  Surgeon: Renette Butters, MD;  Location: [place];  Service: Orthopedics;  Laterality: Right;   I & D EXTREMITY Right 09/30/2014   Procedure: IRRIGATION AND DEBRIDEMENT RIGHT THIGH ABSCESS;  Surgeon: [name], MD;  Location: [place];  Service: Orthopedics;  Laterality: Right;   I & D EXTREMITY Right 10/03/2014   Procedure: IRRIGATION AND DEBRIDEMENT RIGHT THIGH ABSCESS,WITH WOUND CLOSURE & MANIPULATION OF RIGHT KNEE;  Surgeon: Renette Butters, MD;  Location: [place];  Service: Orthopedics;  Laterality: Right;   INCISION AND DRAINAGE ABSCESS Right 12/09/2014   Procedure: INCISION AND DRAINAGE ABSCESS;  Surgeon: Renette Butters, MD;  Location: [place];  Service: Orthopedics;  Laterality: Right;   INSERTION OF DIALYSIS CATHETER N/A 09/01/2014   Procedure: INSERTION OF DIALYSIS CATHETER IN RIGHT INTERNAL JUGUILAR;  Surgeon: Conrad [place], MD;  Location: [place];  Service: Vascular;  Laterality: N/A;   KIDNEY TRANSPLANT  05/15/15   OPEN REDUCTION INTERNAL FIXATION (ORIF) FOOT LISFRANC FRACTURE Left 07/28/2020   Procedure: OPEN REDUCTION INTERNAL FIXATION (ORIF) FOOT LISFRANC FRACTURE;  Surgeon: Altamese M[place]res, MD;  Location: [place];  Service: Orthopedics;  Laterality: Left;   ORIF TOE FRACTURE Left 07/28/2020   Procedure: OPEN REDUCTION INTERNAL FIXATION (ORIF) METATARSAL (TOE) FRACTURE;  Surgeon: Altamese [place], MD;  Location: [place];  Service: Orthopedics;  Laterality: Left;   PARS PLANA VITRECTOMY  10/01/2011   Procedure: PARS PLANA VITRECTOMY WITH 25 GAUGE;  Surgeon: Hayden Pedro, MD;  Location: [place];   Service: Ophthalmology;  Laterality: Right;  Repair Complex Traction Retinal Detachment   PERCUTANEOUS PINNING Left 07/28/2020   Procedure: PERCUTANEOUS PINNING  2ND AND 3RD METATARSALS;  Surgeon: Altamese , MD;  Location: [place];  Service: Orthopedics;  Laterality: Left;   PILONIDAL CYST EXCISION     TRIGGER FINGER RELEASE Right 09/21/13   WISDOM TOOTH EXTRACTION       Scheduled Meds:  fentaNYL       HYDROmorphone       HYDROmorphone       methocarbamol       scopolamine  1 patch Transdermal Q72H   Continuous Infusions:  acetaminophen     ceFAZolin     methocarbamol (ROBAXIN) IV     PRN Meds:.methocarbamol **OR** methocarbamol (ROBAXIN) IV, oxyCODONE, oxyCODONE  Allergies  Allergen Reactions   Penicillins Hives and Swelling    Patient is Cephalosporin tolerant    Sulfa Antibiotics Hives   Social History   Socioeconomic History   Marital status: Married    Spouse name: Audelia Acton   Number of children: 0   Years of education: College   Highest education level: Not on file  Occupational History    Employer: [employer]  Tobacco Use   Smoking status: Never   Smokeless tobacco: Never  Vaping Use   Vaping Use: Never used  Substance and Sexual Activity   Alcohol use: No    Alcohol/week: 0.0 standard drinks   Drug use: No   Sexual activity: Yes    Birth control/protection: Injection  Other Topics Concern   Not on file  Social History Narrative   Patient lives at home with family.   Caffeine Use: 1 soda daily   Social Determinants of Health   Financial Resource Strain: Low Risk    Difficulty of Paying Living Expenses: Not hard at all  Food Insecurity: No Food Insecurity   Worried About Charity fundraiser in the Last Year: Never true   Ran Out of Food in the Last Year: Never true  Transportation Needs: No Transportation Needs   Lack of Transportation (Medical): No   Lack of Transportation (Non-Medical): No  Physical Activity: Sufficiently Active   Days of Exercise  per Week: 7 days   Minutes of Exercise per Session: 90 min  Stress: Stress Concern Present   Feeling of Stress : To some extent  Social Connections: Socially Integrated   Frequency of Communication with Friends and Family: More than three times a week   Frequency of  Social Gatherings with Friends and Family: Twice a week   Attends Religious Services: More than 4 times per year   Active Member of [employer] or Organizations: Yes   Attends Music therapist: More than 4 times per year   Marital Status: Married  Human resources officer Violence: Not At Risk   Fear of Current or Ex-Partner: No   Emotionally Abused: No   Physically Abused: No   Sexually Abused: No    Vitals BP 124/77 (BP Location: Right Arm)   Pulse 70   Temp 98 F (36.7 C) (Oral)   Resp 18   Ht '5\' 2"'$  (1.575 m)   Wt 74.8 kg   SpO2 100%   BMI 30.18 kg/m    Physical Exam Constitutional:  comfortable and not in acute distress     Comments:   Cardiovascular:     Rate and Rhythm: Normal rate and regular rhythm.     Heart sounds:  Pulmonary:     Effort: Pulmonary effort is normal.     Comments: room air  Abdominal:     Palpations: Abdomen is soft.     Tenderness: Non tender and non distended   Musculoskeletal:        General: left foot is wrapped in a bandage   Skin:    Comments: No lesions or rashes   Neurological:     General: No focal deficit present.   Psychiatric:        Mood and Affect: Mood normal.     Pertinent Microbiology Results for orders placed or performed during the hospital encounter of 09/19/20  SARS Coronavirus 2 by RT PCR (hospital order, performed in [facility] hospital lab) Nasopharyngeal Nasopharyngeal Swab     Status: None   Collection Time: 09/19/20  6:27 AM   Specimen: Nasopharyngeal Swab  Result Value Ref Range Status   SARS Coronavirus 2 NEGATIVE NEGATIVE Final    Comment: (NOTE) SARS-CoV-2 target nucleic acids are NOT DETECTED.  The SARS-CoV-2 RNA is generally  detectable in upper and lower respiratory specimens during the acute phase of infection. The lowest concentration of SARS-CoV-2 viral copies this assay can detect is 250 copies / mL. A negative result does not preclude SARS-CoV-2 infection and should not be used as the sole basis for treatment or other patient management decisions.  A negative result may occur with improper specimen collection / handling, submission of specimen other than nasopharyngeal swab, presence of viral mutation(s) within the areas targeted by this assay, and inadequate number of viral copies (<250 copies / mL). A negative result must be combined with clinical observations, patient history, and epidemiological information.  Fact Sheet for Patients:   StrictlyIdeas.no  Fact Sheet for Healthcare Providers: BankingDealers.co.za  This test is not yet approved or  cleared by the [place] FDA and has been authorized for detection and/or diagnosis of SARS-CoV-2 by FDA under an Emergency Use Authorization (EUA).  This EUA will remain in effect (meaning this test can be used) for the duration of the COVID-19 declaration under Section 564(b)(1) of the Act, 21 U.S.C. section 360bbb-3(b)(1), unless the authorization is terminated or revoked sooner.  Performed at [facility], [place] [address], [place], [place] [address]     Pertinent Lab seen by me: CBC Latest Ref Rng & Units 07/28/2020 12/09/2014 10/05/2014  WBC 4.0 - 10.5 K/uL 7.3 - 7.1  Hemoglobin 12.0 - 15.0 g/dL 12.4 10.9(L) 8.1(L)  Hematocrit 36.0 - 46.0 % 40.2 32.0(L) 24.9(L)  Platelets 150 -  400 K/uL 219 - 256   CMP Latest Ref Rng & Units 07/28/2020 12/09/2014 10/05/2014  Glucose 70 - 99 mg/dL 100(H) 230(H) 156(H)  BUN 6 - 20 mg/dL 17 - 12  Creatinine 0.44 - 1.00 mg/dL 1.28(H) - 3.82(H)  Sodium 135 - 145 mmol/L 140 138 136  Potassium 3.5 - 5.1 mmol/L 3.7 3.6 4.4  Chloride 98 - 111 mmol/L 109 - 99(L)   CO2 22 - 32 mmol/L 25 - 25  Calcium 8.9 - 10.3 mg/dL 9.3 - 9.1  Total Protein 6.5 - 8.1 g/dL 5.9(L) - -  Total Bilirubin 0.3 - 1.2 mg/dL 0.5 - -  Alkaline Phos 38 - 126 U/L 142(H) - -  AST 15 - 41 U/L 17 - -  ALT 0 - 44 U/L 34 - -     Pertinent Imagings/Other Imagings Plain films and CT images have been personally visualized and interpreted; radiology reports have been reviewed. Decision making incorporated into the Impression / Recommendations.   I spent more than 70  minutes for this patient encounter including review of prior medical records/discussing diagnostics and treatment plan with the patient/family/coordinate care with primary/other specialits with greater than 50% of time in face to face encounter.   Electronically signed by:   Rosiland Oz, MD Infectious Disease Physician [facility] for Infectious Disease Pager: [phone]

## 2020-09-19 NOTE — Progress Notes (Signed)
Orthopedic Tech Progress Note Patient Details:  [name] [birth year] [id number]  Applied an [place] WITH TRAPEZE   Patient ID: Tracy [name], female   DOB: Mar 22, 1985, 35 y.o.   MRN: [id number]  Janit Pagan 09/19/2020, 7:03 PM

## 2020-09-19 NOTE — Progress Notes (Addendum)
Pharmacy Antibiotic Note  Tracy [name] is a 35 y.o. female with history of ORIF and pinning of Lisfranc injury 8 weeks ago, and pins were pulled on 09/18/20.  Patient admitted on 09/19/2020 with left foot pain with worsening malaise, vomiting, fever, erythema and swelling.  S/p I&D today 09/19/20.  Pharmacy has been consulted for vancomycin and cefepime dosing for 7 days.  No recent labs.    Plan: Vanc '1500mg'$  IV x 1 Cefepime 2gm IV x 1 Check labs prior to scheduling doses d/t hx kidney/pancreas transplant.  Height: '5\' 2"'$  (157.5 cm) Weight: 74.8 kg (165 lb) IBW/kg (Calculated) : 50.1  Temp (24hrs), Avg:97.6 F (36.4 C), Min:97 F (36.1 C), Max:98.1 F (36.7 C)  No results for input(s): WBC, CREATININE, LATICACIDVEN, VANCOTROUGH, VANCOPEAK, VANCORANDOM, GENTTROUGH, GENTPEAK, GENTRANDOM, TOBRATROUGH, TOBRAPEAK, TOBRARND, AMIKACINPEAK, AMIKACINTROU, AMIKACIN in the last 168 hours.  CrCl cannot be calculated (Patient's most recent lab result is older than the maximum 21 days allowed.).    Allergies  Allergen Reactions   Penicillins Hives and Swelling    Patient is Cephalosporin tolerant    Sulfa Antibiotics Hives    Vanc 8/9 >> 8/15 Cefepime 8/9 >> 8/15   8/9 deep wound cx -  Mayelin Panos D. Mina Marble, PharmD, BCPS, [place] 09/19/2020, 12:45 PM  ==================================  Addendum: SCr 1.28, CrCL 57 ml/min Vanc '1500mg'$  IV x 1, then '750mg'$  IV Q24H for AUC 503 x 7 days total Cefepime 2gm IV Q12H x7 days total Monitor renal fxn, clinical progress, vanc levels if indicated  Merit Gadsby D. Mina Marble, PharmD, BCPS, [place] 09/19/2020, 1:55 PM

## 2020-09-19 NOTE — H&P (Signed)
Orthopaedic Trauma Service H&P/Consult     Patient ID: Tracy [name] MRN: [id number] DOB/AGE: 19-May-1985 35 y.o.  Chief Complaint: Left foot infection HPI: Tracy [name] is an 35 y.o. female with left foot pain increasing rapidly over last 72 hours with associated malaise, vomiting, fever, erythema and swelling. S/p ORIF and pinning of Lisfranc injury 8 weeks ago. Pins pulled yesterday am and started on Duricef. On immunosuppressants for kidney/ pancreas transplant in 2017 after septic shock and major infection in 2016.  Past Medical History:  Diagnosis Date   Abscess of right thigh    Anemia    presently on iron supplement   Anxiety    ASCUS with positive high risk human papillomavirus of vagina 05/01/2020   04/2020 ASCUS +HPV 16 and other will get Colpo    Atypical squamous cell changes of undetermined significance (ASCUS) on vaginal cytology with positive high risk human papilloma virus (HPV) 09/05/2016   Get colpo   Chronic kidney disease    on dialysis T, Th, Sat   CMV (cytomegalovirus infection) ([place])    Depression    Detached retina    Diabetes mellitus without complication ([place])    History of organ transplantation 08/18/2015   HSV-1 (herpes simplex virus 1) infection    HSV-2 (herpes simplex virus 2) infection    Hypertension    Hypothyroidism    Irregular periods 07/05/2014   Neuropathy    in feet   Seizures ([place]) 01/2014   ? due to blood sugar   Thyroid enlargement    "not on medication at this time"   Urinary tract infection    hx of   Vaginal discharge 07/05/2014   Vaginal Pap smear, abnormal    Yeast infection    took diflucan saturday     Past Surgical History:  Procedure Laterality Date   APPLICATION OF A-CELL OF EXTREMITY Right 10/03/2014   Procedure: APPLICATION OF A-CELL OF RIGHT UPPER THIGH WOUND;  Surgeon: Renette Butters, MD;  Location: [place];  Service: Orthopedics;  Laterality: Right;   APPLICATION OF WOUND VAC Right  10/03/2014   Procedure: APPLICATION OF WOUND VAC RIGHT THIGH;  Surgeon: Renette Butters, MD;  Location: [place];  Service: Orthopedics;  Laterality: Right;   AV FISTULA PLACEMENT Left 09/01/2014   Procedure: BRACHIOCEPHALIC ARTERIOVENOUS (AV) FISTULA CREATION;  Surgeon: Conrad Vicks[place], MD;  Location: [place];  Service: Vascular;  Laterality: Left;   CHOLECYSTECTOMY     COMBINED KIDNEY-PANCREAS TRANSPLANT  05/15/15   I & D EXTREMITY Right 08/14/2014   Procedure: IRRIGATION AND DEBRIDEMENT EXTREMITY WITH MUSCLE BIOPSY;  Surgeon: Roseanne Kaufman, MD;  Location: [place];  Service: Orthopedics;  Laterality: Right;   I & D EXTREMITY Right 08/20/2014   Procedure:  I AND D LEG / THIGH ABSCESS AND MANIPULATION OF RIGHT ELBOW.;  Surgeon: Renette Butters, MD;  Location: [place];  Service: Orthopedics;  Laterality: Right;   I & D EXTREMITY Right 09/30/2014   Procedure: IRRIGATION AND DEBRIDEMENT RIGHT THIGH ABSCESS;  Surgeon: [name], MD;  Location: [place];  Service: Orthopedics;  Laterality: Right;   I & D EXTREMITY Right 10/03/2014   Procedure: IRRIGATION AND DEBRIDEMENT RIGHT THIGH ABSCESS,WITH WOUND CLOSURE & MANIPULATION OF RIGHT KNEE;  Surgeon: Renette Butters, MD;  Location: [place];  Service: Orthopedics;  Laterality: Right;   INCISION AND DRAINAGE ABSCESS Right 12/09/2014   Procedure: INCISION AND DRAINAGE ABSCESS;  Surgeon: Renette Butters, MD;  Location: [facility]  OR;  Service: Orthopedics;  Laterality: Right;   INSERTION OF DIALYSIS CATHETER N/A 09/01/2014   Procedure: INSERTION OF DIALYSIS CATHETER IN RIGHT INTERNAL JUGUILAR;  Surgeon: Conrad [place], MD;  Location: [place];  Service: Vascular;  Laterality: N/A;   KIDNEY TRANSPLANT  05/15/15   OPEN REDUCTION INTERNAL FIXATION (ORIF) FOOT LISFRANC FRACTURE Left 07/28/2020   Procedure: OPEN REDUCTION INTERNAL FIXATION (ORIF) FOOT LISFRANC FRACTURE;  Surgeon: Altamese Mead, MD;  Location: [place];  Service: Orthopedics;  Laterality: Left;   ORIF TOE FRACTURE Left 07/28/2020    Procedure: OPEN REDUCTION INTERNAL FIXATION (ORIF) METATARSAL (TOE) FRACTURE;  Surgeon: Altamese Kerkhoven, MD;  Location: [place];  Service: Orthopedics;  Laterality: Left;   PARS PLANA VITRECTOMY  10/01/2011   Procedure: PARS PLANA VITRECTOMY WITH 25 GAUGE;  Surgeon: Hayden Pedro, MD;  Location: [place];  Service: Ophthalmology;  Laterality: Right;  Repair Complex Traction Retinal Detachment   PERCUTANEOUS PINNING Left 07/28/2020   Procedure: PERCUTANEOUS PINNING  2ND AND 3RD METATARSALS;  Surgeon: Altamese [place], MD;  Location: [place];  Service: Orthopedics;  Laterality: Left;   PILONIDAL CYST EXCISION     TRIGGER FINGER RELEASE Right 09/21/13   WISDOM TOOTH EXTRACTION      Family History  Problem Relation Age of Onset   Cancer Paternal Grandfather        prostate   Hyperlipidemia Paternal Grandfather    Stroke Paternal Grandfather    Social History:  reports that she has never smoked. She has never used smokeless tobacco. She reports that she does not drink alcohol and does not use drugs.  Allergies:  Allergies  Allergen Reactions   Penicillins Hives and Swelling    Has patient had a PCN reaction causing immediate rash, facial/tongue/throat swelling, SOB or lightheadedness with hypotension: Yes Has patient had a PCN reaction causing severe rash involving mucus membranes or skin necrosis: Yes Has patient had a PCN reaction that required hospitalization No Has patient had a PCN reaction occurring within the last 10 years: No If all of the above answers are "NO", then may proceed with Cephalosporin use.    Sulfa Antibiotics Hives    Medications Prior to Admission  Medication Sig Dispense Refill   medroxyPROGESTERone (DEPO-PROVERA) 150 MG/ML injection Inject 1 mL (150 mg total) into the muscle every 3 (three) months.     methocarbamol (ROBAXIN) 500 MG tablet Take 1 tablet (500 mg total) by mouth every 6 (six) hours as needed for muscle spasms. 60 tablet 0   mycophenolate (MYFORTIC) 180 MG  EC tablet Take 720 mg by mouth 2 (two) times daily.     ondansetron (ZOFRAN ODT) 4 MG disintegrating tablet Take 1 tablet (4 mg total) by mouth every 8 (eight) hours as needed for nausea or vomiting. 20 tablet 0   oxyCODONE-acetaminophen (PERCOCET/ROXICET) 5-325 MG tablet Take 1 tablet by mouth every 6 (six) hours as needed for pain.     predniSONE (DELTASONE) 10 MG tablet Take 10 mg by mouth daily with breakfast.     tacrolimus (PROGRAF) 1 MG capsule Take 2 mg by mouth 2 (two) times daily.     traZODone (DESYREL) 50 MG tablet Take 1 tablet (50 mg total) by mouth at bedtime as needed for sleep.      Results for orders placed or performed during the hospital encounter of 09/19/20 (from the past 48 hour(s))  SARS Coronavirus 2 by RT PCR (hospital order, performed in [facility] hospital lab) Nasopharyngeal Nasopharyngeal Swab  Status: None   Collection Time: 09/19/20  6:27 AM   Specimen: Nasopharyngeal Swab  Result Value Ref Range   SARS Coronavirus 2 NEGATIVE NEGATIVE    Comment: (NOTE) SARS-CoV-2 target nucleic acids are NOT DETECTED.  The SARS-CoV-2 RNA is generally detectable in upper and lower respiratory specimens during the acute phase of infection. The lowest concentration of SARS-CoV-2 viral copies this assay can detect is 250 copies / mL. A negative result does not preclude SARS-CoV-2 infection and should not be used as the sole basis for treatment or other patient management decisions.  A negative result may occur with improper specimen collection / handling, submission of specimen other than nasopharyngeal swab, presence of viral mutation(s) within the areas targeted by this assay, and inadequate number of viral copies (<250 copies / mL). A negative result must be combined with clinical observations, patient history, and epidemiological information.  Fact Sheet for Patients:   StrictlyIdeas.no  Fact Sheet for Healthcare  Providers: BankingDealers.co.za  This test is not yet approved or  cleared by the [place] FDA and has been authorized for detection and/or diagnosis of SARS-CoV-2 by FDA under an Emergency Use Authorization (EUA).  This EUA will remain in effect (meaning this test can be used) for the duration of the COVID-19 declaration under Section 564(b)(1) of the Act, 21 U.S.C. section 360bbb-3(b)(1), unless the authorization is terminated or revoked sooner.  Performed at [facility], [place] [address], [place], [place] [address]   Pregnancy, urine POC     Status: None   Collection Time: 09/19/20  7:33 AM  Result Value Ref Range   Preg Test, Ur NEGATIVE NEGATIVE    Comment:        THE SENSITIVITY OF THIS METHODOLOGY IS >24 mIU/mL    No results found.  ROS As above  Blood pressure (!) 119/56, pulse 87, temperature 98.1 F (36.7 C), temperature source Oral, resp. rate 17, height '5\' 2"'$  (1.575 m), weight 74.8 kg. Physical Exam NCAT, malaise LLE Dressing intact, clean, dry  Edema/ swelling increased from yesterday as has erythema  Sens: DPN, SPN, TN intact  Motor: EHL, FHL, and lessor toe ext and flex all intact grossly  Brisk cap refill, warm to touch  No tenderness extending up the leg or associated erythema at this point  Assessment/Plan  Left foot infection, h/o sepsis, on immunosuppressants Debridement and vac with plans for MRI to follow  I discussed with the patient the risks and benefits of surgery, including the possibility of persistent infection, nerve injury, vessel injury, wound breakdown, arthritis, symptomatic hardware, DVT/ PE, loss of motion, occult nonunion, and need for further surgery among others.  We also specifically discussed the possible need to stage surgery because of the elevated risk of soft tissue breakdown that could lead to amputation.  She acknowledged these risks and wished to proceed.   09/19/2020, 8:12 AM  [facility] [place] [phone] (F)

## 2020-09-19 NOTE — Anesthesia Procedure Notes (Signed)
Procedure Name: LMA Insertion Date/Time: 09/19/2020 8:23 AM Performed by: Bryson Corona, CRNA Pre-anesthesia Checklist: Patient identified, Emergency Drugs available, Suction available and Patient being monitored Patient Re-evaluated:Patient Re-evaluated prior to induction Oxygen Delivery Method: Circle System Utilized Preoxygenation: Pre-oxygenation with 100% oxygen Induction Type: IV induction Ventilation: Mask ventilation without difficulty LMA: LMA inserted LMA Size: 4.0 Number of attempts: 1 Placement Confirmation: positive ETCO2 Tube secured with: Tape Dental Injury: Teeth and Oropharynx as per pre-operative assessment

## 2020-09-19 NOTE — Anesthesia Preprocedure Evaluation (Signed)
Anesthesia Evaluation  Patient identified by MRN, date of birth, ID band Patient awake    Reviewed: Allergy & Precautions, NPO status , Patient's Chart, lab work & pertinent test results  Airway Mallampati: II  TM Distance: >3 FB Neck ROM: Full    Dental no notable dental hx.    Pulmonary neg pulmonary ROS,    Pulmonary exam normal breath sounds clear to auscultation       Cardiovascular hypertension, Normal cardiovascular exam Rhythm:Regular Rate:Normal     Neuro/Psych negative neurological ROS  negative psych ROS   GI/Hepatic negative GI ROS, Neg liver ROS,   Endo/Other  diabetesHypothyroidism   Renal/GU negative Renal ROS  negative genitourinary   Musculoskeletal negative musculoskeletal ROS (+)   Abdominal   Peds negative pediatric ROS (+)  Hematology negative hematology ROS (+)   Anesthesia Other Findings   Reproductive/Obstetrics negative OB ROS                             Anesthesia Physical Anesthesia Plan  ASA: 2  Anesthesia Plan: General   Post-op Pain Management:    Induction: Intravenous  PONV Risk Score and Plan: 3 and Ondansetron, Dexamethasone and Treatment may vary due to age or medical condition  Airway Management Planned: LMA  Additional Equipment:   Intra-op Plan:   Post-operative Plan: Extubation in OR  Informed Consent: I have reviewed the patients History and Physical, chart, labs and discussed the procedure including the risks, benefits and alternatives for the proposed anesthesia with the patient or authorized representative who has indicated his/her understanding and acceptance.     Dental advisory given  Plan Discussed with: CRNA and Surgeon  Anesthesia Plan Comments:         Anesthesia Quick Evaluation

## 2020-09-19 NOTE — Progress Notes (Signed)
PT Cancellation Note  Patient Details Name: Tracy [name] MRN: [id number] DOB: [birth year]   Cancelled Treatment:    Reason Eval/Treat Not Completed: Patient at procedure or test/unavailable - at MRI, PT to check back tomorrow.   Stacie Glaze, PT DPT Acute Rehabilitation Services Pager [phone]  Office [phone]    Louis Matte 09/19/2020, 3:48 PM

## 2020-09-19 NOTE — Brief Op Note (Signed)
[id number] 

## 2020-09-19 NOTE — Anesthesia Postprocedure Evaluation (Signed)
Anesthesia Post Note  Patient: PHYLICIA MERCY  Procedure(s) Performed: IRRIGATION AND DEBRIDEMENT EXTREMITY (Left: Foot)     Patient location during evaluation: PACU Anesthesia Type: General Level of consciousness: awake and alert Pain management: pain level controlled Vital Signs Assessment: post-procedure vital signs reviewed and stable Respiratory status: spontaneous breathing, nonlabored ventilation, respiratory function stable and patient connected to nasal cannula oxygen Cardiovascular status: blood pressure returned to baseline and stable Postop Assessment: no apparent nausea or vomiting Anesthetic complications: no   No notable events documented.  Last Vitals:  Vitals:   09/19/20 1055 09/19/20 1109  BP: 127/79 124/77  Pulse: 66 70  Resp: 10 18  Temp: (!) 36.1 C 36.7 C  SpO2: 99% 100%    Last Pain:  Vitals:   09/19/20 1109  TempSrc: Oral                 Kennidee Heyne S

## 2020-09-20 ENCOUNTER — Encounter (HOSPITAL_COMMUNITY): Payer: Self-pay | Admitting: Orthopedic Surgery

## 2020-09-20 DIAGNOSIS — M869 Osteomyelitis, unspecified: Secondary | ICD-10-CM

## 2020-09-20 DIAGNOSIS — M86172 Other acute osteomyelitis, left ankle and foot: Secondary | ICD-10-CM | POA: Diagnosis present

## 2020-09-20 DIAGNOSIS — L02612 Cutaneous abscess of left foot: Secondary | ICD-10-CM | POA: Diagnosis not present

## 2020-09-20 HISTORY — DX: Cutaneous abscess of left foot: L02.612

## 2020-09-20 HISTORY — DX: Osteomyelitis, unspecified: M86.9

## 2020-09-20 LAB — COMPREHENSIVE METABOLIC PANEL
ALT: 22 U/L (ref 0–44)
AST: 11 U/L — ABNORMAL LOW (ref 15–41)
Albumin: 2.3 g/dL — ABNORMAL LOW (ref 3.5–5.0)
Alkaline Phosphatase: 130 U/L — ABNORMAL HIGH (ref 38–126)
Anion gap: 8 (ref 5–15)
BUN: 14 mg/dL (ref 6–20)
CO2: 20 mmol/L — ABNORMAL LOW (ref 22–32)
Calcium: 8.8 mg/dL — ABNORMAL LOW (ref 8.9–10.3)
Chloride: 109 mmol/L (ref 98–111)
Creatinine, Ser: 1.43 mg/dL — ABNORMAL HIGH (ref 0.44–1.00)
GFR, Estimated: 49 mL/min — ABNORMAL LOW (ref >60)
Glucose, Bld: 159 mg/dL — ABNORMAL HIGH (ref 70–99)
Potassium: 4.7 mmol/L (ref 3.5–5.1)
Sodium: 137 mmol/L (ref 135–145)
Total Bilirubin: 0.3 mg/dL (ref 0.3–1.2)
Total Protein: 5.6 g/dL — ABNORMAL LOW (ref 6.5–8.1)

## 2020-09-20 MED ORDER — TRAMADOL HCL 50 MG PO TABS
50.0000 mg | ORAL_TABLET | Freq: Four times a day (QID) | ORAL | Status: DC | PRN
  Administered 2020-09-22 – 2020-09-23 (×5): 100 mg via ORAL
  Filled 2020-09-20 (×5): qty 2

## 2020-09-20 MED ORDER — GABAPENTIN 300 MG PO CAPS
300.0000 mg | ORAL_CAPSULE | Freq: Three times a day (TID) | ORAL | Status: DC
  Administered 2020-09-20 – 2020-09-23 (×10): 300 mg via ORAL
  Filled 2020-09-20 (×10): qty 1

## 2020-09-20 MED ORDER — CEFAZOLIN SODIUM-DEXTROSE 2-4 GM/100ML-% IV SOLN
2.0000 g | Freq: Three times a day (TID) | INTRAVENOUS | Status: DC
  Administered 2020-09-20 – 2020-09-23 (×9): 2 g via INTRAVENOUS
  Filled 2020-09-20 (×10): qty 100

## 2020-09-20 NOTE — Plan of Care (Signed)
  Problem: Activity: Goal: Risk for activity intolerance will decrease Outcome: Progressing   Problem: Education: Goal: Knowledge of General Education information will improve Description: Including pain rating scale, medication(s)/side effects and non-pharmacologic comfort measures 09/20/2020 0358 by Kennith Center, RN Outcome: Progressing 09/20/2020 0357 by Kennith Center, RN Outcome: Progressing   Problem: Elimination: Goal: Will not experience complications related to urinary retention Outcome: Progressing

## 2020-09-20 NOTE — Op Note (Signed)
NAME: Tracy [name], NIKOLICH MEDICAL RECORD NO: [id number] ACCOUNT NO: [id number] DATE OF BIRTH: 01/03/1986 FACILITY: [facility] LOCATION: [facility] PHYSICIAN: Astrid Divine. Marcelino Scot, MD  Operative Report   PREOPERATIVE DIAGNOSES:  1.  Left foot abscess. 2.  Status post ORIF and CRPP of the Lisfranc and multiple metatarsal fractures.  POSTOPERATIVE DIAGNOSES: 1.  Left foot abscess. 2.  Status post ORIF and CRPP of the Lisfranc and multiple metatarsal fractures.  PROCEDURES:    1.  Incision and drainage of left foot abscess. 2.  Application of small wound VAC.  SURGEON:  Altamese [place], MD  ASSISTANT:  None.  ANESTHESIA:  General.  COMPLICATIONS:  None.  TOURNIQUET:  None.  SPECIMENS:  Two aerobic and two anaerobic sent to micro.  DISPOSITION:  To PACU.  CONDITION:  Stable.  BRIEF SUMMARY OF INDICATIONS FOR PROCEDURE:  The patient is a very pleasant 35 year old female who has a past medical history notable for sepsis and multiple infections in addition to a subsequent combined kidney-pancreas transplant performed at [facility].  The patient is on immunosuppressants as a result of that, but her diabetes resolved with the transplant.  She describes 3 days of progressive pain, erythema, malaise, vomiting and tenderness at the foot.  She was seen yesterday in the office and  redness was minimal, but one of the pins was noted to be mobile, particularly along the third metatarsal.  Pins were both pulled.  There was no drainage at that time and she was started on Duricef.  She was instructed to contact me immediately with any  increase in pain or redness.  She did so late yesterday evening and the decision was made to proceed for emergent surgery this morning with subsequent MRI.  I did discuss with her preoperatively the risks and benefits of surgery including potential for  including the likelihood of subsequent surgery, failure to resolve the infection, occult nonunion, multiple others.  She did  provide consent to proceed.  BRIEF SUMMARY OF PROCEDURE:  The patient was taken to the operating room where general anesthesia was induced.  Antibiotics were held until we could obtain a culture.  The forefoot incision was extended distally in a curvilinear pattern to go over the  most swollen area which was centered over the third metatarsal.  Incising into the subcutaneous tissue, it had a somewhat abnormal appearance but no purulence.  There was some edema within the tissue planes and I excised some of this tissue and sent it  for anaerobic and aerobic cultures.  I then probed deeply within the webspace and did encounter purulence.  This was sent for anaerobic and aerobic analysis.  It was copiously washed out and the washout was supplemented with chlorhexidine soap.  I used 6  liters.  I then placed a small wound VAC being to make sure that some of the sponge extended below the metatarsal, so there would not be a dead space allowed to accumulate there.  A sterile gently compressive dressing was applied and then a posterior  and stirrup splint.  The patient was taken to the PACU in stable condition.  Antibiotics were started as soon as the specimens were collected with a dose of ceftriaxone and vancomycin.  PROGNOSIS: The patient will be seen and evaluated by the infectious disease service who has already been contacted.  We will obtain an MRI and adjust her antibiotics accordingly, at this time a longer term course of cefepime and vancomycin is anticipated  for empiric coverage with tailoring to  follow by the ID service.  We would presume osteomyelitis and treat for an extended period of time given her immunosuppression and prior history.  She remains at elevated risk for potentially severe complications  including the possibility of limb loss, but were encouraged by the limited scope of infection found today in the early treatment.   SHW D: 09/19/2020 5:15:54 pm T: 09/19/2020 10:28:00 pm  JOB:  [id number]

## 2020-09-20 NOTE — Evaluation (Signed)
Physical Therapy Evaluation Patient Details Name: Tracy [name] MRN: [id number] DOB: 06-20-1985 Today's Date: 09/20/2020   History of Present Illness  35 yo female s/p debridement and vac placement L foot on 8/9. pt with ORIF L lisfranc fx with percutaneous pinning 6/17. PMH includes anxiety, anemia, HPV, depression, detached retina, DM, seizures, neuropathy bilat feet, kidney transplant, pancreas transplant.  Clinical Impression  Pt was seen for initiation of movement and discussion of how she navigates steps.  Pt is apparently crawling up the steps, and will work on a better and safer technique to manage entrance to the house with her.  Follow for goals of acute PT and progress as tolerated.  Pt also has been using her rollator to prop L knee and managing at home, and shared with her that this is non standard use and cannot be done here.      Follow Up Recommendations No PT follow up;Follow surgeon's recommendation for DC plan and follow-up therapies    Equipment Recommendations  None recommended by PT    Recommendations for Other Services       Precautions / Restrictions Precautions Precautions: Fall Precaution Comments: NWB on LLE, Required Braces or Orthoses: Splint/Cast Splint/Cast: L foot has splint from surgery Restrictions Weight Bearing Restrictions: Yes LLE Weight Bearing: Non weight bearing      Mobility  Bed Mobility Overal bed mobility: Modified Independent             General bed mobility comments: extra time to get to side of bed    Transfers Overall transfer level: Needs assistance Equipment used: Rolling walker (2 wheeled);1 person hand held assist Transfers: Sit to/from Stand Sit to Stand: Min guard         General transfer comment: min guard for safety  Ambulation/Gait Ambulation/Gait assistance: Min guard Gait Distance (Feet): 60 Feet Assistive device: Rolling walker (2 wheeled);1 person hand held assist   Gait velocity: reduced Gait  velocity interpretation: <1.31 ft/sec, indicative of household ambulator General Gait Details: hopped on RLE and maintained NWB well  Stairs            Wheelchair Mobility    Modified Rankin (Stroke Patients Only)       Balance Overall balance assessment: Needs assistance Sitting-balance support: Bilateral upper extremity supported Sitting balance-Leahy Scale: Fair     Standing balance support: Bilateral upper extremity supported;During functional activity Standing balance-Leahy Scale: Poor                               Pertinent Vitals/Pain Pain Assessment: Faces Faces Pain Scale: Hurts little more Pain Location: L foot with meds and ice Pain Descriptors / Indicators: Operative site guarding Pain Intervention(s): Limited activity within patient's tolerance;Monitored during session;Premedicated before session;Repositioned;Ice applied    Home Living Family/patient expects to be discharged to:: Private residence Living Arrangements: Spouse/significant other Available Help at Discharge: Family;Available PRN/intermittently Type of Home: House Home Access: Stairs to enter Entrance Stairs-Rails: None Entrance Stairs-Number of Steps: 3 Home Layout: One level Home Equipment: Crutches;Walker - 4 wheels;Walker - 2 wheels Additional Comments: has been home since June 2022 with this injury    Prior Function Level of Independence: Independent with assistive device(s)         Comments: has been home with husband at times to h elp     Hand Dominance   Dominant Hand: Right    Extremity/Trunk Assessment   Upper Extremity Assessment Upper Extremity Assessment:  Overall [facility] for tasks assessed    Lower Extremity Assessment Lower Extremity Assessment: LLE deficits/detail LLE Deficits / Details: L hip has 4/5 strength and knee and ankle not tested due to injury LLE Coordination: decreased gross motor    Cervical / Trunk Assessment Cervical / Trunk  Assessment: Normal  Communication   Communication: No difficulties  Cognition Arousal/Alertness: Awake/alert Behavior During Therapy: WFL for tasks assessed/performed Overall Cognitive Status: Within Functional Limits for tasks assessed                                        General Comments General comments (skin integrity, edema, etc.): Pt was seen for initial mobility and is appropriate to go home wiht HHPT if MD wishes to order    Exercises General Exercises - Lower Extremity Ankle Circles/Pumps: AROM;Right;5 reps Quad Sets: AROM;10 reps;Both Gluteal Sets: AROM;Both;10 reps   Assessment/Plan    PT Assessment Patient needs continued PT services  PT Problem List Decreased strength;Decreased range of motion;Decreased activity tolerance;Decreased balance;Decreased mobility;Decreased coordination;Decreased skin integrity;Pain;Decreased knowledge of use of DME       PT Treatment Interventions DME instruction;Gait training;Stair training;Functional mobility training;Therapeutic activities;Therapeutic exercise;Balance training;Neuromuscular re-education;Patient/family education    PT Goals (Current goals can be found in the Care Plan section)  Acute Rehab PT Goals Patient Stated Goal: to get home and finish with surgery PT Goal Formulation: With patient Time For Goal Achievement: 09/27/20 Potential to Achieve Goals: Good    Frequency Min 5X/week   Barriers to discharge Inaccessible home environment;Decreased caregiver support      Co-evaluation               AM-PAC PT "6 Clicks" Mobility  Outcome Measure Help needed turning from your back to your side while in a flat bed without using bedrails?: None Help needed moving from lying on your back to sitting on the side of a flat bed without using bedrails?: A Little Help needed moving to and from a bed to a chair (including a wheelchair)?: A Little Help needed standing up from a chair using your arms  (e.g., wheelchair or bedside chair)?: A Little Help needed to walk in hospital room?: A Little Help needed climbing 3-5 steps with a railing? : A Little 6 Click Score: 19    End of Session Equipment Utilized During Treatment: Gait belt Activity Tolerance: Patient tolerated treatment well Patient left: with call bell/phone within reach;in chair;with chair alarm set Nurse Communication: Mobility status PT Visit Diagnosis: Muscle weakness (generalized) (M62.81);Pain Pain - Right/Left: Left Pain - part of body: Ankle and joints of foot    Time: [id number] PT Time Calculation (min) (ACUTE ONLY): 38 min   Charges:   PT Evaluation $PT Eval Moderate Complexity: 1 Mod PT Treatments $Gait Training: 8-22 mins $Therapeutic Exercise: 8-22 mins       Ramond Dial 09/20/2020, 3:22 PM Mee Hives, PT MS Acute Rehab Dept. Number: [place] [facility] [place]

## 2020-09-20 NOTE — Evaluation (Signed)
Occupational Therapy Evaluation and Discharge Patient Details Name: Tracy [name] MRN: [id number] DOB: 1986/01/15 Today's Date: 09/20/2020    History of Present Illness 35 yo female s/p debridement and vac placement L foot on 8/9. pt with ORIF L lisfranc fx with percutaneous pinning 6/17. PMH includes anxiety, anemia, HPV, depression, detached retina, DM, seizures, neuropathy bilat feet, kidney transplant, pancreas transplant.   Clinical Impression   Pt admitted for concerns and procedure listed above. PTA pt reported that she was independent with all ADL's and most IADL's. At this time, pt does not appear to be limited by pain, she reports that she has been NWB since June, and demonstrated compensatory techniques she uses for dressing, bathing, and transferring/ambulating for toileting. At this time she has no OT needs and acute OT will sign off.     Follow Up Recommendations  No OT follow up;Supervision - Intermittent    Equipment Recommendations  None recommended by OT    Recommendations for Other Services       Precautions / Restrictions Precautions Precautions: Fall Precaution Comments: NWB on LLE, Required Braces or Orthoses: Splint/Cast Splint/Cast: L foot has splint from surgery Restrictions Weight Bearing Restrictions: Yes LLE Weight Bearing: Non weight bearing      Mobility Bed Mobility Overal bed mobility: Modified Independent             General bed mobility comments: up in recliner    Transfers Overall transfer level: Modified independent Equipment used: Rolling walker (2 wheeled);1 person hand held assist Transfers: Sit to/from Stand Sit to Stand: Min guard         General transfer comment: Pt able to stand from recliner and bsc with no difficulties, safely    Balance Overall balance assessment: Mild deficits observed, not formally tested Sitting-balance support: Bilateral upper extremity supported Sitting balance-Leahy Scale: Fair      Standing balance support: Bilateral upper extremity supported;During functional activity Standing balance-Leahy Scale: Poor                             ADL either performed or assessed with clinical judgement   ADL Overall ADL's : Modified independent                                       General ADL Comments: Pt has been dealing with this situation for a few months now, has compensatory techniques she uses, and is safe with all transfers and functional mobility     Vision Baseline Vision/History: No visual deficits Patient Visual Report: No change from baseline Vision Assessment?: No apparent visual deficits     Perception Perception Perception Tested?: No   Praxis Praxis Praxis tested?: Not tested    Pertinent Vitals/Pain Pain Assessment: No/denies pain Faces Pain Scale: Hurts little more Pain Location: L foot with meds and ice Pain Descriptors / Indicators: Operative site guarding Pain Intervention(s): Limited activity within patient's tolerance;Monitored during session;Premedicated before session;Repositioned;Ice applied     Hand Dominance Right   Extremity/Trunk Assessment Upper Extremity Assessment Upper Extremity Assessment: Overall WFL for tasks assessed   Lower Extremity Assessment Lower Extremity Assessment: Defer to PT evaluation LLE Deficits / Details: L hip has 4/5 strength and knee and ankle not tested due to injury LLE Coordination: decreased gross motor   Cervical / Trunk Assessment Cervical / Trunk Assessment: Normal   Communication Communication Communication:  No difficulties   Cognition Arousal/Alertness: Awake/alert Behavior During Therapy: WFL for tasks assessed/performed Overall Cognitive Status: Within Functional Limits for tasks assessed                                     General Comments  VSS on RA    Exercises Exercises: General Lower Extremity General Exercises - Lower Extremity Ankle  Circles/Pumps: AROM;Right;5 reps Quad Sets: AROM;10 reps;Both Gluteal Sets: AROM;Both;10 reps   Shoulder Instructions      Home Living Family/patient expects to be discharged to:: Private residence Living Arrangements: Spouse/significant other Available Help at Discharge: Family;Available PRN/intermittently Type of Home: House Home Access: Stairs to enter [employer] of Steps: 3 Entrance Stairs-Rails: None Home Layout: One level     Bathroom Shower/Tub: Tub/shower unit;Walk-in shower   Bathroom Toilet: Standard Bathroom Accessibility: Yes How Accessible: Accessible via walker Home Equipment: [place] - 2 wheels;Walker - 4 wheels;Crutches;Shower seat;Bedside commode   Additional Comments: has been home since June 2022 with this injury      Prior Functioning/Environment Level of Independence: Independent with assistive device(s)        Comments: has been home with husband at times to h elp        OT Problem List: Decreased strength;Decreased activity tolerance;Impaired balance (sitting and/or standing);Decreased knowledge of use of DME or AE;Pain      OT Treatment/Interventions:      OT Goals(Current goals can be found in the care plan section) Acute Rehab OT Goals Patient Stated Goal: For this process to be over with OT Goal Formulation: All assessment and education complete, DC therapy Time For Goal Achievement: 09/20/20 Potential to Achieve Goals: Good  OT Frequency:     Barriers to D/C:            Co-evaluation              AM-PAC OT "6 Clicks" Daily Activity     Outcome Measure Help from another person eating meals?: None Help from another person taking care of personal grooming?: None Help from another person toileting, which includes using toliet, bedpan, or urinal?: None Help from another person bathing (including washing, rinsing, drying)?: None Help from another person to put on and taking off regular upper body clothing?: None Help  from another person to put on and taking off regular lower body clothing?: None 6 Click Score: 24   End of Session Equipment Utilized During Treatment: Rolling walker Nurse Communication: Mobility status  Activity Tolerance: Patient tolerated treatment well Patient left: in chair;with call bell/phone within reach  OT Visit Diagnosis: Unsteadiness on feet (R26.81);Other abnormalities of gait and mobility (R26.89);Muscle weakness (generalized) (M62.81)                Time: [id number] OT Time Calculation (min): 12 min Charges:  OT General Charges $OT Visit: 1 Visit OT Evaluation $OT Eval Low Complexity: [place]., OTR/L Acute Rehabilitation  Fatemah Pourciau Elane Yolanda Bonine 09/20/2020, 4:16 PM

## 2020-09-20 NOTE — Progress Notes (Signed)
Orthopaedic Trauma Service Progress Note  Patient ID: Tracy [name] MRN: [id number] DOB/AGE: April 12, 1985 35 y.o.  Subjective:  Feels better than pre-op No acute issues   Asking if her port can be used for long term iv abx administration   Gram stain shows few gram positive cocci in pairs in clusters  Review of Systems  Constitutional:  Negative for chills and fever.  Respiratory:  Negative for shortness of breath and wheezing.   Cardiovascular:  Negative for chest pain and palpitations.  Gastrointestinal:  Negative for abdominal pain, nausea and vomiting.  Neurological:  Negative for dizziness and headaches.   Objective:   VITALS:   Vitals:   09/19/20 1703 09/19/20 2203 09/20/20 0550 09/20/20 0815  BP: 140/79 115/70 115/60 120/68  Pulse: (!) 58 (!) 57 77 61  Resp: '17 16 19 19  '$ Temp: 97.9 F (36.6 C) 98.1 F (36.7 C) 97.7 F (36.5 C) 98 F (36.7 C)  TempSrc: Oral Oral Oral Oral  SpO2: 100% 99% 99% 100%  Weight:      Height:        Estimated body mass index is 30.18 kg/m as calculated from the following:   Height as of this encounter: '5\' 2"'$  (1.575 m).   Weight as of this encounter: 74.8 kg.   Intake/Output      08/09 0701 08/10 0700 08/10 0701 08/11 0700   P.O. 580 320   I.V. (mL/kg) 1841.2 (24.6)    IV Piggyback 300    Total Intake(mL/kg) 2721.2 (36.4) 320 (4.3)   Urine (mL/kg/hr) 0 (0)    Blood 10    Total Output 10    Net +2711.2 +320        Urine Occurrence 3 x      LABS  Results for orders placed or performed during the hospital encounter of 09/19/20 (from the past 24 hour(s))  C-reactive protein     Status: Abnormal   Collection Time: 09/19/20 12:52 PM  Result Value Ref Range   CRP 25.7 (H) <1.0 mg/dL  Sedimentation rate     Status: Abnormal   Collection Time: 09/19/20 12:52 PM  Result Value Ref Range   Sed Rate 93 (H) 0 - 22 mm/hr  Hemoglobin A1c     Status:  Abnormal   Collection Time: 09/19/20 12:52 PM  Result Value Ref Range   Hgb A1c MFr Bld 6.0 (H) 4.8 - 5.6 %   Mean Plasma Glucose 125.5 mg/dL  Basic metabolic panel     Status: Abnormal   Collection Time: 09/19/20 12:52 PM  Result Value Ref Range   Sodium 136 135 - 145 mmol/L   Potassium 4.3 3.5 - 5.1 mmol/L   Chloride 105 98 - 111 mmol/L   CO2 22 22 - 32 mmol/L   Glucose, Bld 119 (H) 70 - 99 mg/dL   BUN 12 6 - 20 mg/dL   Creatinine, Ser 1.28 (H) 0.44 - 1.00 mg/dL   Calcium 9.4 8.9 - 10.3 mg/dL   GFR, Estimated 56 (L) >60 mL/min   Anion gap 9 5 - 15  Comprehensive metabolic panel     Status: Abnormal   Collection Time: 09/20/20  1:28 AM  Result Value Ref Range   Sodium 137 135 - 145 mmol/L   Potassium 4.7 3.5 - 5.1 mmol/L   Chloride  109 98 - 111 mmol/L   CO2 20 (L) 22 - 32 mmol/L   Glucose, Bld 159 (H) 70 - 99 mg/dL   BUN 14 6 - 20 mg/dL   Creatinine, Ser 1.43 (H) 0.44 - 1.00 mg/dL   Calcium 8.8 (L) 8.9 - 10.3 mg/dL   Total Protein 5.6 (L) 6.5 - 8.1 g/dL   Albumin 2.3 (L) 3.5 - 5.0 g/dL   AST 11 (L) 15 - 41 U/L   ALT 22 0 - 44 U/L   Alkaline Phosphatase 130 (H) 38 - 126 U/L   Total Bilirubin 0.3 0.3 - 1.2 mg/dL   GFR, Estimated 49 (L) >60 mL/min   Anion gap 8 5 - 15     PHYSICAL EXAM:   Gen: awake, sitting up in bed, NAD  Lungs: unlabored Cardiac: regular  Ext:       Left Lower Extremity   SLS in place fitting well  VAC functioning scant drainage  Ext warm   Mild swelling   Good perfusion distally   Assessment/Plan: 1 Day Post-Op   Anti-infectives (From admission, onward)    Start     Dose/Rate Route Frequency Ordered Stop   09/20/20 1600  vancomycin (VANCOREADY) IVPB 750 mg/150 mL        750 mg 150 mL/hr over 60 Minutes Intravenous Every 24 hours 09/19/20 1358 09/26/20 1559   09/20/20 0100  ceFEPIme (MAXIPIME) 2 g in sodium chloride 0.9 % 100 mL IVPB        2 g 200 mL/hr over 30 Minutes Intravenous Every 12 hours 09/19/20 1358 09/26/20 0059   09/19/20  1315  vancomycin (VANCOREADY) IVPB 1500 mg/300 mL        1,500 mg 150 mL/hr over 120 Minutes Intravenous  Once 09/19/20 1224 09/19/20 1600   09/19/20 1315  ceFEPIme (MAXIPIME) 2 g in sodium chloride 0.9 % 100 mL IVPB        2 g 200 mL/hr over 30 Minutes Intravenous  Once 09/19/20 1224 09/19/20 1340   09/19/20 0845  vancomycin (VANCOCIN) IVPB 1000 mg/200 mL premix  Status:  Discontinued        1,000 mg 200 mL/hr over 60 Minutes Intravenous To Surgery 09/19/20 0837 09/19/20 1106   09/19/20 0845  cefTRIAXone (ROCEPHIN) 2 g in sodium chloride 0.9 % 100 mL IVPB  Status:  Discontinued        2 g 200 mL/hr over 30 Minutes Intravenous To Surgery 09/19/20 0839 09/19/20 1106   09/19/20 0754  ceFAZolin (ANCEF) 2-4 GM/100ML-% IVPB       Note to Pharmacy: Gregery Na   : cabinet override      09/19/20 0754 09/19/20 1959     .  POD/HD#: 1  35 y/o female s/p ORIF L foot fracture dislocation 07/28/2020 admitted with increasing pain and redness L foot   -L foot abscess, L foot osteomyelitis s/p I&D  Return to OR tomorrow for second look and repeat I&D  NWB L leg  Ice and elevate  Splint  Ok to be up    - Pain management:  Titrate meds  States oxycodone not working for her any longer   Will try ultram    If ineffective will try po dilaudid   - ABL anemia/Hemodynamics  Stable  - Medical issues   Home meds    History of renal and pancreas transplant   - DVT/PE prophylaxis:  Lovenox  - ID  Per ID service  Appreciate their assistance  - FEN/GI prophylaxis/Foley/Lines:  Reg diet  NPO after MN   - Dispo:  OR tomorrow for repeat I&D L foot    Jari Pigg, PA-C [phone] (C) 09/20/2020, 10:37 AM  [facility] [place] [phone] Jenetta [phone] (F)    After 5pm and on the weekends please log on to Amion, go to orthopaedics and the look under the [facility] Call for the provider(s) on call. You can also  call our office at [phone] and then follow the prompts to be connected to the call team.

## 2020-09-21 ENCOUNTER — Inpatient Hospital Stay (HOSPITAL_COMMUNITY): Payer: BC Managed Care – PPO | Admitting: Anesthesiology

## 2020-09-21 ENCOUNTER — Encounter (HOSPITAL_COMMUNITY)
Admission: AD | Disposition: A | Discharge status: Home Health | Payer: Self-pay | Source: Ambulatory Visit | Attending: Orthopedic Surgery

## 2020-09-21 ENCOUNTER — Encounter (HOSPITAL_COMMUNITY): Payer: Self-pay | Admitting: Orthopedic Surgery

## 2020-09-21 DIAGNOSIS — L02612 Cutaneous abscess of left foot: Secondary | ICD-10-CM | POA: Diagnosis not present

## 2020-09-21 HISTORY — PX: I & D EXTREMITY: SHX5045

## 2020-09-21 LAB — BASIC METABOLIC PANEL
Anion gap: 5 (ref 5–15)
BUN: 18 mg/dL (ref 6–20)
CO2: 20 mmol/L — ABNORMAL LOW (ref 22–32)
Calcium: 8.7 mg/dL — ABNORMAL LOW (ref 8.9–10.3)
Chloride: 114 mmol/L — ABNORMAL HIGH (ref 98–111)
Creatinine, Ser: 1.27 mg/dL — ABNORMAL HIGH (ref 0.44–1.00)
GFR, Estimated: 56 mL/min — ABNORMAL LOW (ref >60)
Glucose, Bld: 133 mg/dL — ABNORMAL HIGH (ref 70–99)
Potassium: 4.8 mmol/L (ref 3.5–5.1)
Sodium: 139 mmol/L (ref 135–145)

## 2020-09-21 SURGERY — IRRIGATION AND DEBRIDEMENT EXTREMITY
Anesthesia: General | Site: Leg Lower | Laterality: Left

## 2020-09-21 MED ORDER — APREPITANT 40 MG PO CAPS
40.0000 mg | ORAL_CAPSULE | Freq: Once | ORAL | Status: AC
  Administered 2020-09-21: 40 mg via ORAL

## 2020-09-21 MED ORDER — PROPOFOL 10 MG/ML IV BOLUS
INTRAVENOUS | Status: AC
  Filled 2020-09-21: qty 20

## 2020-09-21 MED ORDER — MIDAZOLAM HCL 2 MG/2ML IJ SOLN
INTRAMUSCULAR | Status: DC | PRN
  Administered 2020-09-21: 2 mg via INTRAVENOUS

## 2020-09-21 MED ORDER — OXYCODONE HCL 5 MG PO TABS
ORAL_TABLET | ORAL | Status: AC
  Filled 2020-09-21: qty 1

## 2020-09-21 MED ORDER — HYDROMORPHONE HCL 1 MG/ML IJ SOLN: 0.2500 mg | INTRAMUSCULAR | Status: DC | PRN

## 2020-09-21 MED ORDER — FENTANYL CITRATE (PF) 250 MCG/5ML IJ SOLN
INTRAMUSCULAR | Status: AC
  Filled 2020-09-21: qty 5

## 2020-09-21 MED ORDER — LACTATED RINGERS IV SOLN: INTRAVENOUS | Status: DC

## 2020-09-21 MED ORDER — MIDAZOLAM HCL 2 MG/2ML IJ SOLN
INTRAMUSCULAR | Status: AC
  Filled 2020-09-21: qty 2

## 2020-09-21 MED ORDER — CHLORHEXIDINE GLUCONATE 0.12 % MT SOLN
OROMUCOSAL | Status: AC
  Administered 2020-09-21: 15 mL via OROMUCOSAL
  Filled 2020-09-21: qty 15

## 2020-09-21 MED ORDER — LIDOCAINE 2% (20 MG/ML) 5 ML SYRINGE
INTRAMUSCULAR | Status: DC | PRN
  Administered 2020-09-21: 80 mg via INTRAVENOUS

## 2020-09-21 MED ORDER — ONDANSETRON HCL 4 MG/2ML IJ SOLN
INTRAMUSCULAR | Status: AC
  Filled 2020-09-21: qty 2

## 2020-09-21 MED ORDER — FENTANYL CITRATE (PF) 100 MCG/2ML IJ SOLN
50.0000 ug | Freq: Once | INTRAMUSCULAR | Status: AC
  Administered 2020-09-21: 50 ug via INTRAVENOUS

## 2020-09-21 MED ORDER — FENTANYL CITRATE (PF) 100 MCG/2ML IJ SOLN
INTRAMUSCULAR | Status: AC
  Filled 2020-09-21: qty 2

## 2020-09-21 MED ORDER — VANCOMYCIN HCL 500 MG IV SOLR
INTRAVENOUS | Status: DC | PRN
  Administered 2020-09-21: 500 mg via TOPICAL

## 2020-09-21 MED ORDER — FENTANYL CITRATE (PF) 100 MCG/2ML IJ SOLN: 50.0000 ug | Freq: Once | INTRAMUSCULAR | Status: AC

## 2020-09-21 MED ORDER — DEXAMETHASONE SODIUM PHOSPHATE 10 MG/ML IJ SOLN
INTRAMUSCULAR | Status: AC
  Filled 2020-09-21: qty 1

## 2020-09-21 MED ORDER — CHLORHEXIDINE GLUCONATE CLOTH 2 % EX PADS
6.0000 | MEDICATED_PAD | Freq: Every day | CUTANEOUS | Status: DC
  Administered 2020-09-21 – 2020-09-22 (×2): 6 via TOPICAL

## 2020-09-21 MED ORDER — FENTANYL CITRATE (PF) 250 MCG/5ML IJ SOLN
INTRAMUSCULAR | Status: DC | PRN
  Administered 2020-09-21: 50 ug via INTRAVENOUS
  Administered 2020-09-21: 100 ug via INTRAVENOUS
  Administered 2020-09-21: 25 ug via INTRAVENOUS
  Administered 2020-09-21: 50 ug via INTRAVENOUS
  Administered 2020-09-21: 25 ug via INTRAVENOUS

## 2020-09-21 MED ORDER — PROPOFOL 10 MG/ML IV BOLUS
INTRAVENOUS | Status: DC | PRN
  Administered 2020-09-21: 200 mg via INTRAVENOUS

## 2020-09-21 MED ORDER — PROMETHAZINE HCL 25 MG/ML IJ SOLN
INTRAMUSCULAR | Status: AC
  Filled 2020-09-21: qty 1

## 2020-09-21 MED ORDER — ONDANSETRON HCL 4 MG/2ML IJ SOLN
INTRAMUSCULAR | Status: DC | PRN
  Administered 2020-09-21: 4 mg via INTRAVENOUS

## 2020-09-21 MED ORDER — OXYCODONE HCL 5 MG PO TABS
5.0000 mg | ORAL_TABLET | Freq: Once | ORAL | Status: AC | PRN
Start: 2020-09-21 — End: 2020-09-21
  Administered 2020-09-21: 5 mg via ORAL

## 2020-09-21 MED ORDER — LIDOCAINE 2% (20 MG/ML) 5 ML SYRINGE
INTRAMUSCULAR | Status: AC
  Filled 2020-09-21: qty 5

## 2020-09-21 MED ORDER — FENTANYL CITRATE (PF) 100 MCG/2ML IJ SOLN
INTRAMUSCULAR | Status: AC
  Administered 2020-09-21: 50 ug via INTRAVENOUS
  Filled 2020-09-21: qty 2

## 2020-09-21 MED ORDER — 0.9 % SODIUM CHLORIDE (POUR BTL) OPTIME
TOPICAL | Status: DC | PRN
  Administered 2020-09-21: 1000 mL

## 2020-09-21 MED ORDER — CHLORHEXIDINE GLUCONATE 0.12 % MT SOLN: 15.0000 mL | Freq: Once | OROMUCOSAL | Status: AC

## 2020-09-21 MED ORDER — PROMETHAZINE HCL 25 MG/ML IJ SOLN
6.2500 mg | INTRAMUSCULAR | Status: DC | PRN
  Administered 2020-09-21: 6.25 mg via INTRAVENOUS

## 2020-09-21 MED ORDER — VANCOMYCIN HCL 500 MG IV SOLR
INTRAVENOUS | Status: AC
  Filled 2020-09-21: qty 500

## 2020-09-21 MED ORDER — ORAL CARE MOUTH RINSE: 15.0000 mL | Freq: Once | OROMUCOSAL | Status: AC

## 2020-09-21 MED ORDER — PHENYLEPHRINE 40 MCG/ML (10ML) SYRINGE FOR IV PUSH (FOR BLOOD PRESSURE SUPPORT)
PREFILLED_SYRINGE | INTRAVENOUS | Status: AC
  Filled 2020-09-21: qty 10

## 2020-09-21 MED ORDER — OXYCODONE HCL 5 MG/5ML PO SOLN
5.0000 mg | Freq: Once | ORAL | Status: AC | PRN
Start: 2020-09-21 — End: 2020-09-21

## 2020-09-21 MED ORDER — DEXAMETHASONE SODIUM PHOSPHATE 10 MG/ML IJ SOLN
INTRAMUSCULAR | Status: DC | PRN
  Administered 2020-09-21: 4 mg via INTRAVENOUS

## 2020-09-21 MED ORDER — APREPITANT 40 MG PO CAPS
ORAL_CAPSULE | ORAL | Status: AC
  Filled 2020-09-21: qty 1

## 2020-09-21 MED ORDER — FENTANYL CITRATE (PF) 100 MCG/2ML IJ SOLN
25.0000 ug | INTRAMUSCULAR | Status: DC | PRN
  Administered 2020-09-21 (×3): 50 ug via INTRAVENOUS

## 2020-09-21 SURGICAL SUPPLY — 49 items
BAG COUNTER SPONGE SURGICOUNT (BAG) IMPLANT
BAG SPNG CNTER NS LX DISP (BAG)
BNDG COHESIVE 4X5 TAN STRL (GAUZE/BANDAGES/DRESSINGS) ×2 IMPLANT
BNDG ELASTIC 4X5.8 VLCR STR LF (GAUZE/BANDAGES/DRESSINGS) ×1 IMPLANT
BNDG ELASTIC 6X5.8 VLCR STR LF (GAUZE/BANDAGES/DRESSINGS) ×1 IMPLANT
BNDG GAUZE ELAST 4 BULKY (GAUZE/BANDAGES/DRESSINGS) ×2 IMPLANT
BRUSH SCRUB EZ PLAIN DRY (MISCELLANEOUS) ×4 IMPLANT
COVER SURGICAL LIGHT HANDLE (MISCELLANEOUS) ×3 IMPLANT
DRAIN PENROSE 12X.25 LTX STRL (MISCELLANEOUS) ×1 IMPLANT
DRAPE U-SHAPE 47X51 STRL (DRAPES) ×2 IMPLANT
DRSG ADAPTIC 3X8 NADH LF (GAUZE/BANDAGES/DRESSINGS) ×1 IMPLANT
DRSG MEPITEL 4X7.2 (GAUZE/BANDAGES/DRESSINGS) ×1 IMPLANT
ELECT REM PT RETURN 9FT ADLT (ELECTROSURGICAL)
ELECTRODE REM PT RTRN 9FT ADLT (ELECTROSURGICAL) IMPLANT
GAUZE SPONGE 4X4 12PLY STRL (GAUZE/BANDAGES/DRESSINGS) ×2 IMPLANT
GLOVE SRG 8 PF TXTR STRL LF DI (GLOVE) ×1 IMPLANT
GLOVE SURG ENC MOIS LTX SZ7.5 (GLOVE) ×2 IMPLANT
GLOVE SURG ENC MOIS LTX SZ8 (GLOVE) ×2 IMPLANT
GLOVE SURG UNDER POLY LF SZ7.5 (GLOVE) ×2 IMPLANT
GLOVE SURG UNDER POLY LF SZ8 (GLOVE) ×2
GLOVE SURG UNDER POLY LF SZ9 (GLOVE) ×2 IMPLANT
GOWN STRL REUS W/ TWL LRG LVL3 (GOWN DISPOSABLE) ×2 IMPLANT
GOWN STRL REUS W/ TWL XL LVL3 (GOWN DISPOSABLE) ×1 IMPLANT
GOWN STRL REUS W/TWL LRG LVL3 (GOWN DISPOSABLE) ×4
GOWN STRL REUS W/TWL XL LVL3 (GOWN DISPOSABLE) ×2
HANDPIECE INTERPULSE COAX TIP (DISPOSABLE)
KIT BASIN OR (CUSTOM PROCEDURE TRAY) ×2 IMPLANT
KIT TURNOVER KIT B (KITS) ×2 IMPLANT
MANIFOLD NEPTUNE II (INSTRUMENTS) ×1 IMPLANT
NS IRRIG 1000ML POUR BTL (IV SOLUTION) ×2 IMPLANT
PACK ORTHO EXTREMITY (CUSTOM PROCEDURE TRAY) ×2 IMPLANT
PAD ABD 8X10 STRL (GAUZE/BANDAGES/DRESSINGS) ×1 IMPLANT
PAD ARMBOARD 7.5X6 YLW CONV (MISCELLANEOUS) ×4 IMPLANT
PAD CAST 4YDX4 CTTN HI CHSV (CAST SUPPLIES) IMPLANT
PADDING CAST COTTON 4X4 STRL (CAST SUPPLIES) ×4
PADDING CAST COTTON 6X4 STRL (CAST SUPPLIES) ×1 IMPLANT
SET HNDPC FAN SPRY TIP SCT (DISPOSABLE) IMPLANT
SOL PREP POV-IOD 4OZ 10% (MISCELLANEOUS) ×2 IMPLANT
SOL PREP PROV IODINE SCRUB 4OZ (MISCELLANEOUS) ×2 IMPLANT
SPONGE T-LAP 18X18 ~~LOC~~+RFID (SPONGE) ×2 IMPLANT
STOCKINETTE IMPERVIOUS 9X36 MD (GAUZE/BANDAGES/DRESSINGS) IMPLANT
SUT ETHILON 2 0 PSLX (SUTURE) ×2 IMPLANT
SUT PDS AB 2-0 CT1 27 (SUTURE) ×1 IMPLANT
TOWEL GREEN STERILE (TOWEL DISPOSABLE) ×4 IMPLANT
TOWEL GREEN STERILE FF (TOWEL DISPOSABLE) ×2 IMPLANT
TUBE CONNECTING 12X1/4 (SUCTIONS) ×2 IMPLANT
UNDERPAD 30X36 HEAVY ABSORB (UNDERPADS AND DIAPERS) ×2 IMPLANT
WATER STERILE IRR 1000ML POUR (IV SOLUTION) ×2 IMPLANT
YANKAUER SUCT BULB TIP NO VENT (SUCTIONS) ×2 IMPLANT

## 2020-09-21 NOTE — Progress Notes (Signed)
PHARMACY CONSULT NOTE FOR:  OUTPATIENT  PARENTERAL ANTIBIOTIC THERAPY (OPAT)  Indication: Left ankle wound with hardware Regimen: cefazolin 2g IV q8h End date: 11/02/20  IV antibiotic discharge orders are pended. To discharging provider:  please sign these orders via discharge navigator,  Select New Orders & click on the button choice - Manage This Unsigned Work.     Thank you for allowing pharmacy to be a part of this patient's care.  Tracy [name] 09/21/2020, 2:30 PM

## 2020-09-21 NOTE — Anesthesia Postprocedure Evaluation (Signed)
Anesthesia Post Note  Patient: Tracy [name]  Procedure(s) Performed: IRRIGATION AND DEBRIDEMENT EXTREMITY, partial excision bone 2nd metatarsal (Left: Leg Lower)     Patient location during evaluation: PACU Anesthesia Type: General Level of consciousness: awake and alert Pain management: pain level controlled Vital Signs Assessment: post-procedure vital signs reviewed and stable Respiratory status: spontaneous breathing, nonlabored ventilation and respiratory function stable Cardiovascular status: stable and blood pressure returned to baseline Anesthetic complications: no   No notable events documented.  Last Vitals:  Vitals:   09/21/20 1315 09/21/20 1346  BP: 133/79 (!) 155/76  Pulse: 64 65  Resp: 11 18  Temp:  36.5 C  SpO2: 99% 100%    Last Pain:  Vitals:   09/21/20 1346  TempSrc: Oral  PainSc:                  Audry Pili

## 2020-09-21 NOTE — Anesthesia Procedure Notes (Signed)
Procedure Name: LMA Insertion Date/Time: 09/21/2020 11:23 AM Performed by: Barrington Ellison, CRNA Pre-anesthesia Checklist: Patient identified, Emergency Drugs available, Suction available and Patient being monitored Patient Re-evaluated:Patient Re-evaluated prior to induction Oxygen Delivery Method: Circle System Utilized Preoxygenation: Pre-oxygenation with 100% oxygen Induction Type: IV induction Ventilation: Mask ventilation without difficulty LMA: LMA inserted LMA Size: 4.0 Number of attempts: 1 Placement Confirmation: positive ETCO2 Tube secured with: Tape Dental Injury: Teeth and Oropharynx as per pre-operative assessment

## 2020-09-21 NOTE — Progress Notes (Signed)
PT Cancellation Note  Patient Details Name: Tracy [name] MRN: [id number] DOB: August 30, 1985   Cancelled Treatment:    Reason Eval/Treat Not Completed: Patient at procedure or test/unavailable.  Follow up after sx tomorrow.   Ramond Dial 09/21/2020, 10:01 AM  Mee Hives, PT MS Acute Rehab Dept. Number: [place] [facility] [place]

## 2020-09-21 NOTE — Anesthesia Preprocedure Evaluation (Addendum)
Anesthesia Evaluation  Patient identified by MRN, date of birth, ID band Patient awake    Reviewed: Allergy & Precautions, NPO status , Patient's Chart, lab work & pertinent test results  History of Anesthesia Complications Negative for: history of anesthetic complications  Airway Mallampati: III  TM Distance: >3 FB Neck ROM: Full    Dental  (+) Dental Advisory Given   Pulmonary neg pulmonary ROS,    Pulmonary exam normal        Cardiovascular hypertension (off meds since renal transplant), Normal cardiovascular exam     Neuro/Psych Seizures -, Well Controlled,  PSYCHIATRIC DISORDERS Anxiety Depression  Neuromuscular disease    GI/Hepatic negative GI ROS, Neg liver ROS,   Endo/Other  diabetes, Type 1Hypothyroidism  Obesity No insulin since pancreatic transplant in 2017   Renal/GU Renal disease Off dialysis since renal transplant in 2017      Musculoskeletal negative musculoskeletal ROS (+)   Abdominal   Peds  Hematology negative hematology ROS (+)   Anesthesia Other Findings HSV Covid test negative   Reproductive/Obstetrics                            Anesthesia Physical Anesthesia Plan  ASA: 3  Anesthesia Plan: General   Post-op Pain Management:    Induction: Intravenous  PONV Risk Score and Plan: 3 and Treatment may vary due to age or medical condition, Ondansetron, Dexamethasone and Midazolam  Airway Management Planned: LMA  Additional Equipment: None  Intra-op Plan:   Post-operative Plan: Extubation in OR  Informed Consent: I have reviewed the patients History and Physical, chart, labs and discussed the procedure including the risks, benefits and alternatives for the proposed anesthesia with the patient or authorized representative who has indicated his/her understanding and acceptance.     Dental advisory given  Plan Discussed with: CRNA and  Anesthesiologist  Anesthesia Plan Comments:       Anesthesia Quick Evaluation

## 2020-09-21 NOTE — Transfer of Care (Signed)
Immediate Anesthesia Transfer of Care Note  Patient: Tracy [name]  Procedure(s) Performed: IRRIGATION AND DEBRIDEMENT EXTREMITY, partial excision bone 2nd metatarsal (Left: Leg Lower)  Patient Location: PACU  Anesthesia Type:General  Level of Consciousness: awake and oriented  Airway & Oxygen Therapy: Patient Spontanous Breathing and Patient connected to nasal cannula oxygen  Post-op Assessment: Report given to RN  Post vital signs: Reviewed and stable  Last Vitals:  Vitals Value Taken Time  BP 146/79   Temp    Pulse 72 09/21/20 1215  Resp    SpO2 100 % 09/21/20 1215  Vitals shown include unvalidated device data.  Last Pain:  Vitals:   09/21/20 0936  TempSrc: Oral  PainSc:       Patients Stated Pain Goal: 1 ([id number] [phone])  Complications: No notable events documented.

## 2020-09-21 NOTE — Progress Notes (Addendum)
[facility] Infectious Diseases Follow Up Note  Patient Identification: Patient Name: Tracy [name] MRN: [id number] [place] Date: 09/19/2020  5:52 AM Age: 35 y.o.Today's Date: 09/21/2020   Reason for Visit: hardware associated osteomyelitis   Principal Problem:   Abscess of left foot Active Problems:   Chronic anemia   History of organ transplantation   Anxiety   MDD (major depressive disorder), recurrent episode, moderate (HCC)   Cellulitis of left foot   Osteomyelitis of left foot (HCC)  Antibiotics:  Vancomycin 8/9- 8/11 Ceftriaxone 8/9- 8/10, cefazolin 8/10-current    Lines/Tubes: PIVs   Interval Events: continues to be afebrile, no leukocytosis, Patient went to OR today. Cultures are growing MSSA.    Assessment Left ankle infection associated with hardware/left foot abscess and osteomyelitis - s/p left ORIF and pinning Lisfranc fracture 07/28/2020  - OR notes reviewed. Discussed with Orthopedics with concerns for osteomyelitis - OR cultures 09/19/20 growing MSSA   2. Type 1 DM  3. Combined kidney and pancreatic transplant   Recommendations Will de-escalate abtx to cefazolin for MSSA. Will avoid Rifampin given DDI with immunosuppressants  Plan for 6 weeks from date of last OR on 09/21/20. Tentative end date 11/02/20. OK to use port per her transplant surgeon Dr Ulice Dash  Monitor CBC and BMP ID pharmacy to place OPAT orders A follow up with [facility] will be made Will follow OR notes and cultures peripherally. Otherwise will sign off.   Plan discussed with patient/Husband/ID pharmacy and Primary  Rest of the management as per the primary team. Thank you for the consult. Please page with pertinent questions or concerns.  ______________________________________________________________________ Subjective patient seen and examined at the bedside. Husband at bedside. Doing well, no complaints. Discussed with her that Dr Ulice Dash is [facility]  using the port for IV abtx. Discussed chances of infections and maintaining strict aseptic precautions.   Vitals BP (!) 155/76 (BP Location: Right Arm)   Pulse 65   Temp 97.7 F (36.5 C) (Oral)   Resp 18   Ht '5\' 2"'$  (1.575 m)   Wt 74.8 kg   SpO2 100%   BMI 30.16 kg/m     Physical Exam Constitutional:  sitting up in bed, not in acute distress     Comments:   Cardiovascular:     Rate and Rhythm: Normal rate and regular rhythm.     Heart sounds:   Pulmonary:     Effort: Pulmonary effort is normal.     Comments: RA  Abdominal:     Palpations: Abdomen is soft.     Tenderness:   Musculoskeletal:        General: Left foot is bandaged, wound vac+  Skin:    Comments: No lesions or rashes   Neurological:     General: No focal deficit present.   Psychiatric:        Mood and Affect: Mood normal.   Pertinent Microbiology CBC Latest Ref Rng & Units 07/28/2020 12/09/2014 10/05/2014  WBC 4.0 - 10.5 K/uL 7.3 - 7.1  Hemoglobin 12.0 - 15.0 g/dL 12.4 10.9(L) 8.1(L)  Hematocrit 36.0 - 46.0 % 40.2 32.0(L) 24.9(L)  Platelets 150 - 400 K/uL 219 - 256   CMP Latest Ref Rng & Units 09/21/2020 09/20/2020 09/19/2020  Glucose 70 - 99 mg/dL 133(H) 159(H) 119(H)  BUN 6 - 20 mg/dL '18 14 12  '$ Creatinine 0.44 - 1.00 mg/dL 1.27(H) 1.43(H) 1.28(H)  Sodium 135 - 145 mmol/L 139 137 136  Potassium 3.5 - 5.1 mmol/L 4.8 4.7 4.3  Chloride 98 - 111 mmol/L 114(H) 109 105  CO2 22 - 32 mmol/L 20(L) 20(L) 22  Calcium 8.9 - 10.3 mg/dL 8.7(L) 8.8(L) 9.4  Total Protein 6.5 - 8.1 g/dL - 5.6(L) -  Total Bilirubin 0.3 - 1.2 mg/dL - 0.3 -  Alkaline Phos 38 - 126 U/L - 130(H) -  AST 15 - 41 U/L - 11(L) -  ALT 0 - 44 U/L - 22 -    Pertinent Micro Results for orders placed or performed during the hospital encounter of 09/19/20  SARS Coronavirus 2 by RT PCR (hospital order, performed in [facility] hospital lab) Nasopharyngeal Nasopharyngeal Swab     Status: None   Collection Time: 09/19/20  6:27 AM   Specimen:  Nasopharyngeal Swab  Result Value Ref Range Status   SARS Coronavirus 2 NEGATIVE NEGATIVE Final    Comment: (NOTE) SARS-CoV-2 target nucleic acids are NOT DETECTED.  The SARS-CoV-2 RNA is generally detectable in upper and lower respiratory specimens during the acute phase of infection. The lowest concentration of SARS-CoV-2 viral copies this assay can detect is 250 copies / mL. A negative result does not preclude SARS-CoV-2 infection and should not be used as the sole basis for treatment or other patient management decisions.  A negative result may occur with improper specimen collection / handling, submission of specimen other than nasopharyngeal swab, presence of viral mutation(s) within the areas targeted by this assay, and inadequate number of viral copies (<250 copies / mL). A negative result must be combined with clinical observations, patient history, and epidemiological information.  Fact Sheet for Patients:   StrictlyIdeas.no  Fact Sheet for Healthcare Providers: BankingDealers.co.za  This test is not yet approved or  cleared by the [place] FDA and has been authorized for detection and/or diagnosis of SARS-CoV-2 by FDA under an Emergency Use Authorization (EUA).  This EUA will remain in effect (meaning this test can be used) for the duration of the COVID-19 declaration under Section 564(b)(1) of the Act, 21 U.S.C. section 360bbb-3(b)(1), unless the authorization is terminated or revoked sooner.  Performed at [facility], [place] [address] [place], [place] [address]   Aerobic/Anaerobic Culture w Gram Stain (surgical/deep wound)     Status: None (Preliminary result)   Collection Time: 09/19/20  8:49 AM   Specimen: Wound; Tissue  Result Value Ref Range Status   Specimen Description WOUND  Final   Special Requests LEFT FOOD WOUND SPEC A  Final   Gram Stain   Final    NO WBC SEEN NO ORGANISMS SEEN Performed at  [facility], [address], [place], [place] [address]    Culture   Final    RARE STAPHYLOCOCCUS AUREUS NO ANAEROBES ISOLATED; CULTURE IN PROGRESS FOR 5 DAYS    Report Status PENDING  Incomplete   Organism ID, Bacteria STAPHYLOCOCCUS AUREUS  Final      Susceptibility   Staphylococcus aureus - MIC*    CIPROFLOXACIN <=0.5 SENSITIVE Sensitive     ERYTHROMYCIN >=8 RESISTANT Resistant     GENTAMICIN <=0.5 SENSITIVE Sensitive     OXACILLIN <=0.25 SENSITIVE Sensitive     TETRACYCLINE <=1 SENSITIVE Sensitive     VANCOMYCIN 1 SENSITIVE Sensitive     TRIMETH/SULFA <=10 SENSITIVE Sensitive     CLINDAMYCIN RESISTANT Resistant     RIFAMPIN <=0.5 SENSITIVE Sensitive     Inducible Clindamycin POSITIVE Resistant     * RARE STAPHYLOCOCCUS AUREUS  Aerobic/Anaerobic Culture w Gram Stain (surgical/deep wound)  Status: None (Preliminary result)   Collection Time: 09/19/20  8:51 AM   Specimen: Wound; Tissue  Result Value Ref Range Status   Specimen Description WOUND  Final   Special Requests LEFT FOOT WOUND SPEC B  Final   Gram Stain   Final    MODERATE WBC PRESENT, PREDOMINANTLY PMN FEW GRAM POSITIVE COCCI IN PAIRS IN CLUSTERS Performed at [facility], [address], [place], [place] [address]    Culture   Final    ABUNDANT STAPHYLOCOCCUS AUREUS NO ANAEROBES ISOLATED; CULTURE IN PROGRESS FOR 5 DAYS    Report Status PENDING  Incomplete   Organism ID, Bacteria STAPHYLOCOCCUS AUREUS  Final      Susceptibility   Staphylococcus aureus - MIC*    CIPROFLOXACIN <=0.5 SENSITIVE Sensitive     ERYTHROMYCIN >=8 RESISTANT Resistant     GENTAMICIN <=0.5 SENSITIVE Sensitive     OXACILLIN <=0.25 SENSITIVE Sensitive     TETRACYCLINE <=1 SENSITIVE Sensitive     VANCOMYCIN <=0.5 SENSITIVE Sensitive     TRIMETH/SULFA <=10 SENSITIVE Sensitive     CLINDAMYCIN RESISTANT Resistant     RIFAMPIN <=0.5 SENSITIVE Sensitive     Inducible Clindamycin POSITIVE Resistant     * ABUNDANT STAPHYLOCOCCUS  AUREUS    Pertinent Imaging today Plain films and CT images have been personally visualized and interpreted; radiology reports have been reviewed. Decision making incorporated into the Impression / Recommendations.  MRI left foot 09/20/20 IMPRESSION: 1. Deep soft tissue defect along the dorsal aspect of the forefoot overlying the distal third metatarsal diaphysis likely site of surgical incision and drainage. 2. Intermediate T1 marrow signal within the third and fourth metatarsal diaphyses is equivocal for osteomyelitis in the setting of recent trauma, surgery, and subsequent hardware removal. 3. Diffuse intramuscular edema, which may reflect a nonspecific myositis.    I spent more than 35 minutes for this patient encounter including review of prior medical records, coordination of care  with greater than 50% of time being face to face/counseling and discussing diagnostics/treatment plan with the patient/family.  Electronically signed by:   Rosiland Oz, MD Infectious Disease Physician [facility] for Infectious Disease Pager: [phone]

## 2020-09-21 NOTE — Progress Notes (Signed)
Cxs show Staph aureus Antibiotic course not yet defined by ID Transplant team plans to decrease immunosuppression by holding [name]  I discussed with the patient the risks and benefits of surgery, including the possibility of persistent infection, nerve injury, vessel injury, wound breakdown, symptomatic hardware, DVT/ PE, loss of motion, malunion, nonunion, and need for further surgery among others. We also specifically discussed retention vs removal of medial plate. She acknowledged these risks and wished to proceed.  Tracy Burnside, MD [facility], [facility] [phone]

## 2020-09-22 ENCOUNTER — Inpatient Hospital Stay (HOSPITAL_COMMUNITY): Payer: BC Managed Care – PPO

## 2020-09-22 ENCOUNTER — Encounter (HOSPITAL_COMMUNITY): Payer: Self-pay | Admitting: Orthopedic Surgery

## 2020-09-22 DIAGNOSIS — I96 Gangrene, not elsewhere classified: Secondary | ICD-10-CM | POA: Diagnosis not present

## 2020-09-22 DIAGNOSIS — M86172 Other acute osteomyelitis, left ankle and foot: Secondary | ICD-10-CM | POA: Diagnosis not present

## 2020-09-22 DIAGNOSIS — I739 Peripheral vascular disease, unspecified: Secondary | ICD-10-CM

## 2020-09-22 DIAGNOSIS — Z949 Transplanted organ and tissue status, unspecified: Secondary | ICD-10-CM | POA: Diagnosis not present

## 2020-09-22 DIAGNOSIS — L02612 Cutaneous abscess of left foot: Secondary | ICD-10-CM | POA: Diagnosis not present

## 2020-09-22 LAB — BASIC METABOLIC PANEL
Anion gap: 4 — ABNORMAL LOW (ref 5–15)
BUN: 19 mg/dL (ref 6–20)
CO2: 21 mmol/L — ABNORMAL LOW (ref 22–32)
Calcium: 8.6 mg/dL — ABNORMAL LOW (ref 8.9–10.3)
Chloride: 112 mmol/L — ABNORMAL HIGH (ref 98–111)
Creatinine, Ser: 1.34 mg/dL — ABNORMAL HIGH (ref 0.44–1.00)
GFR, Estimated: 53 mL/min — ABNORMAL LOW (ref >60)
Glucose, Bld: 133 mg/dL — ABNORMAL HIGH (ref 70–99)
Potassium: 5.2 mmol/L — ABNORMAL HIGH (ref 3.5–5.1)
Sodium: 137 mmol/L (ref 135–145)

## 2020-09-22 MED ORDER — SODIUM CHLORIDE 0.9 % IV SOLN: INTRAVENOUS | Status: DC

## 2020-09-22 MED ORDER — CEFAZOLIN IV (FOR PTA / DISCHARGE USE ONLY): 2.0000 g | Freq: Three times a day (TID) | INTRAVENOUS | 0 refills | Status: AC

## 2020-09-22 NOTE — Progress Notes (Signed)
ABI has been completed.   Preliminary results in CV Proc.   Tracy [name] 09/22/2020 11:30 AM

## 2020-09-22 NOTE — Plan of Care (Signed)
  Problem: Activity: Goal: Risk for activity intolerance will decrease Outcome: Progressing   Problem: Nutrition: Goal: Adequate nutrition will be maintained Outcome: Progressing   Problem: Pain Managment: Goal: General experience of comfort will improve Outcome: Progressing   

## 2020-09-22 NOTE — TOC Initial Note (Addendum)
Transition of Care [facility]) - Initial/Assessment Note    Patient Details  Name: Tracy [name] MRN: [id number] Date of Birth: [birth year]  Transition of Care [facility]) CM/SW Contact:    Marilu Favre, RN Phone Number: 09/22/2020, 8:41 AM  Clinical Narrative:                 Per notes plan to discharge to home on IV ABX end date 11/02/20. Spoke to patient at bedside. She will be discharging to her home [address] [place]. Address in [facility] is her mailing address. Confirmed cell number is [phone].   Patient has done home IV infusions in the past. Her mother is available to help if needed.   Explained home infusion company will be [employer] . Pam with [facility] will come see patient at hospital prior to [place]. Patient will have a [place] for line care and labs.   NCM will begin working on home health.  Patient has Rolator, crutches and IV pole at home    NCM unable to find an accepting home health agency. Pam with [employer] has arranged for [facility] .Teaching has been completed by Alecia Lemming can see patient at home tomorrow. Will need to notify Pam of discharge date  Expected Discharge Plan: [place]     Patient Goals and CMS Choice Patient states their goals for this hospitalization and ongoing recovery are:: to go home CMS Medicare.gov Compare Post Acute Care list provided to:: Patient Choice offered to / list presented to : Patient  Expected Discharge Plan and Services Expected Discharge Plan: [place]   Discharge Planning Services: CM Consult Post Acute Care Choice: [place] arrangements for the past 2 months: Single Family Home                 DME Arranged: N/A DME Agency: NA       HH Arranged: RN          Prior Living Arrangements/Services Living arrangements for the past 2 months: Single Family Home Lives with:: Self Patient language and need for interpreter reviewed:: Yes Do you feel safe going  back to the place where you live?: Yes      Need for Family Participation in Patient Care: Yes (Comment) Care giver support system in place?: Yes (comment) Current home services: DME Criminal Activity/Legal Involvement Pertinent to Current Situation/Hospitalization: No - Comment as needed  Activities of Daily Living Home Assistive Devices/Equipment: Blood pressure cuff ADL Screening (condition at time of admission) Patient's cognitive ability adequate to safely complete daily activities?: Yes Is the patient deaf or have difficulty hearing?: No Does the patient have difficulty seeing, even when wearing glasses/contacts?: No Does the patient have difficulty concentrating, remembering, or making decisions?: No Patient able to express need for assistance with ADLs?: Yes Does the patient have difficulty dressing or bathing?: No Independently performs ADLs?: Yes (appropriate for developmental age) Does the patient have difficulty walking or climbing stairs?: No Weakness of Legs: None Weakness of Arms/Hands: None  Permission Sought/Granted   Permission granted to share information with : No              Emotional Assessment Appearance:: Appears stated age Attitude/Demeanor/Rapport: Engaged Affect (typically observed): Accepting Orientation: : Oriented to Self, Oriented to Place, Oriented to  Time, Oriented to Situation Alcohol / Substance Use: Not Applicable Psych Involvement: No (comment)  Admission diagnosis:  Cellulitis of left foot [L03.116] Patient Active Problem  List   Diagnosis Date Noted   Abscess of left foot 09/20/2020   Osteomyelitis of left foot ([place]) 09/20/2020   Cellulitis of left foot 09/19/2020   ASCUS with positive high risk human papillomavirus of vagina 05/01/2020   Abnormal uterine bleeding (AUB) 04/19/2020   History of abnormal cervical Pap smear 10/06/2017   Encounter for gynecological examination with Papanicolaou smear of cervix 10/06/2017   MDD (major  depressive disorder), recurrent episode, moderate ([place]) 03/19/2017   Atypical squamous cell changes of undetermined significance (ASCUS) on vaginal cytology with positive high risk human papilloma virus (HPV) 09/05/2016   Encounter for surveillance of injectable contraceptive 08/30/2016   Urinary tract infection with hematuria 06/05/2016   Bad odor of urine 06/05/2016   Irregular intermenstrual bleeding 01/18/2016   Anxiety 01/18/2016   History of organ transplantation 08/18/2015   Abscess of calf 12/21/2014   Escherichia coli (E. 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 [place] [id number] Phone: [phone] Fax: [phone]     Social Determinants of Health (SDOH) Interventions    Readmission Risk Interventions No flowsheet data found.

## 2020-09-22 NOTE — Progress Notes (Signed)
Orthopaedic Trauma Service (OTS)  1 Day Post-Op Procedure(s) (LRB): IRRIGATION AND DEBRIDEMENT EXTREMITY, partial excision bone 2nd metatarsal (Left)  Subjective: Patient reports pain as mild.   Husband at bedside. Wants to leave but very understanding. I did discuss my preliminary review of ABI's with Dr. Sharol Given who has been in the OR all day and has more cases to follow today.  Objective: Current Vitals Blood pressure 138/79, pulse 68, temperature 98.3 F (36.8 C), temperature source Oral, resp. rate 18, height '5\' 2"'$  (1.575 m), weight 74.8 kg, SpO2 100 %. Vital signs in last 24 hours: Temp:  [97.4 F (36.3 C)-98.8 F (37.1 C)] 98.3 F (36.8 C) (08/12 1622) Pulse Rate:  [64-76] 68 (08/12 1622) Resp:  [18-20] 18 (08/12 1622) BP: (127-138)/(69-81) 138/79 (08/12 1622) SpO2:  [98 %-100 %] 100 % (08/12 1622)  Intake/Output from previous day: 08/11 0701 - 08/12 0700 In: 1405.9 [P.O.:730; I.V.:469.6; IV Piggyback:206.3] Out: 15 [Blood:15]  LABS No results for input(s): HGB in the last 72 hours. No results for input(s): WBC, RBC, HCT, PLT in the last 72 hours. Recent Labs    09/21/20 0227 09/22/20 0021  NA 139 137  K 4.8 5.2*  CL 114* 112*  CO2 20* 21*  BUN 18 19  CREATININE 1.27* 1.34*  GLUCOSE 133* 133*  CALCIUM 8.7* 8.6*   No results for input(s): LABPT, INR in the last 72 hours.   Physical Exam LLE  Minimal drainage, zero purulence, penrose removed Dressing intact, clean, dry  Edema/ swelling mild and improving  Sens: intact to each side of digits  Motor: gentle EHL, FHL, and lessor toe ext and flex all intact grossly except third  Brisk cap refill, warm to touch  Assessment/Plan: 1 Day Post-Op Procedure(s) (LRB): IRRIGATION AND DEBRIDEMENT EXTREMITY, partial excision bone 2nd metatarsal (Left) 1. Cont IV antibiotics; dressing was changed tonight 2. DVT proph Lovenox 3. Awaiting evaluation by Dr. Sharol Given in the am; probable vascular consultation based off  prelim review. Possible vac to build tissue over bone. Rest of discharge has been completed.  Altamese Cleaton, MD [facility], [facility] [phone]

## 2020-09-22 NOTE — Progress Notes (Signed)
PT Cancellation Note  Patient Details Name: Tracy [name] MRN: [id number] DOB: [birth year]   Cancelled Treatment:    Reason Eval/Treat Not Completed: (P) Other (comment);Patient declined, no reason specified (Pt has been NWB since June and managing at home.  She politely denies needs for PT services at this time.)   Cristela Blue 09/22/2020, 5:37 PM  Erasmo Leventhal , PTA Acute Rehabilitation Services Pager [phone] Office [phone]

## 2020-09-23 DIAGNOSIS — Z949 Transplanted organ and tissue status, unspecified: Secondary | ICD-10-CM | POA: Diagnosis not present

## 2020-09-23 DIAGNOSIS — M86272 Subacute osteomyelitis, left ankle and foot: Secondary | ICD-10-CM

## 2020-09-23 DIAGNOSIS — L02612 Cutaneous abscess of left foot: Secondary | ICD-10-CM | POA: Diagnosis not present

## 2020-09-23 DIAGNOSIS — M86172 Other acute osteomyelitis, left ankle and foot: Secondary | ICD-10-CM | POA: Diagnosis not present

## 2020-09-23 LAB — COMPREHENSIVE METABOLIC PANEL
ALT: 8 U/L (ref 0–44)
AST: 12 U/L — ABNORMAL LOW (ref 15–41)
Albumin: 2.2 g/dL — ABNORMAL LOW (ref 3.5–5.0)
Alkaline Phosphatase: 96 U/L (ref 38–126)
Anion gap: 4 — ABNORMAL LOW (ref 5–15)
BUN: 22 mg/dL — ABNORMAL HIGH (ref 6–20)
CO2: 19 mmol/L — ABNORMAL LOW (ref 22–32)
Calcium: 8.3 mg/dL — ABNORMAL LOW (ref 8.9–10.3)
Chloride: 114 mmol/L — ABNORMAL HIGH (ref 98–111)
Creatinine, Ser: 1.16 mg/dL — ABNORMAL HIGH (ref 0.44–1.00)
GFR, Estimated: >60 mL/min (ref >60)
Glucose, Bld: 115 mg/dL — ABNORMAL HIGH (ref 70–99)
Potassium: 4.9 mmol/L (ref 3.5–5.1)
Sodium: 137 mmol/L (ref 135–145)
Total Bilirubin: 0.5 mg/dL (ref 0.3–1.2)
Total Protein: 5.1 g/dL — ABNORMAL LOW (ref 6.5–8.1)

## 2020-09-23 LAB — CBC
HCT: 28.4 % — ABNORMAL LOW (ref 36.0–46.0)
Hemoglobin: 8.5 g/dL — ABNORMAL LOW (ref 12.0–15.0)
MCH: 26.9 pg (ref 26.0–34.0)
MCHC: 29.9 g/dL — ABNORMAL LOW (ref 30.0–36.0)
MCV: 89.9 fL (ref 80.0–100.0)
Platelets: 246 10*3/uL (ref 150–400)
RBC: 3.16 MIL/uL — ABNORMAL LOW (ref 3.87–5.11)
RDW: 14 % (ref 11.5–15.5)
WBC: 9.1 10*3/uL (ref 4.0–10.5)
nRBC: 0 % (ref 0.0–0.2)

## 2020-09-23 MED ORDER — ASPIRIN EC 325 MG PO TBEC: 325.0000 mg | DELAYED_RELEASE_TABLET | Freq: Every day | ORAL | 0 refills | Status: DC

## 2020-09-23 MED ORDER — ACETAMINOPHEN 500 MG PO TABS: 1000.0000 mg | ORAL_TABLET | Freq: Three times a day (TID) | ORAL | 0 refills | Status: DC

## 2020-09-23 MED ORDER — TRAMADOL HCL 50 MG PO TABS: 50.0000 mg | ORAL_TABLET | Freq: Three times a day (TID) | ORAL | 0 refills | Status: DC | PRN

## 2020-09-23 MED ORDER — METHOCARBAMOL 500 MG PO TABS: 500.0000 mg | ORAL_TABLET | Freq: Three times a day (TID) | ORAL | 0 refills | Status: DC | PRN

## 2020-09-23 MED ORDER — HEPARIN SOD (PORK) LOCK FLUSH 100 UNIT/ML IV SOLN
500.0000 [IU] | INTRAVENOUS | Status: AC | PRN
  Administered 2020-09-23: 500 [IU]
  Filled 2020-09-23: qty 5

## 2020-09-23 NOTE — Discharge Summary (Signed)
[facility] (OTS) Discharge Summary   Patient ID: Tracy [name] MRN: [id number] DOB/AGE: [birth year] 35 y.o.  Admit date: 09/19/2020 Discharge date: 09/23/2020  Admission Diagnoses:   Abscess of left foot   Chronic anemia   History of organ transplantation   Anxiety   MDD (major depressive disorder), recurrent episode, moderate (HCC)   Cellulitis of left foot   Osteomyelitis of left foot ([place])  Discharge Diagnoses:  Principal Problem:   Abscess of left foot Active Problems:   Chronic anemia   History of organ transplantation   Anxiety   MDD (major depressive disorder), recurrent episode, moderate (HCC)   Cellulitis of left foot   Osteomyelitis of left foot (HCC)   Past Medical History:  Diagnosis Date   Abscess of left foot 09/20/2020   Abscess of right thigh    Anemia    presently on iron supplement   Anxiety    ASCUS with positive high risk human papillomavirus of vagina 05/01/2020   04/2020 ASCUS +HPV 16 and other will get Colpo    Atypical squamous cell changes of undetermined significance (ASCUS) on vaginal cytology with positive high risk human papilloma virus (HPV) 09/05/2016   Get colpo   Chronic kidney disease    on dialysis T, Th, Sat   CMV (cytomegalovirus infection) ([place])    Depression    Detached retina    Diabetes mellitus without complication ([place])    History of organ transplantation 08/18/2015   HSV-1 (herpes simplex virus 1) infection    HSV-2 (herpes simplex virus 2) infection    Hypertension    Hypothyroidism    Irregular periods 07/05/2014   Neuropathy    in feet   Osteomyelitis of left foot ([place]) 09/20/2020   Seizures ([place]) 01/2014   ? due to blood sugar   Thyroid enlargement    "not on medication at this time"   Urinary tract infection    hx of   Vaginal discharge 07/05/2014   Vaginal Pap smear, abnormal    Yeast infection    took diflucan saturday      Procedures Performed: 09/19/2020- Dr. Marcelino Scot 1.   Incision and drainage of left foot abscess. 2.  Application of small wound VAC.  09/21/2020- Dr. Marcelino Scot Repeat I&D L foot     Discharged Condition: good   Consults: ID, Dr. Sharol Given   Significant Diagnostic Studies: labs:  0 Result Notes Component 2 wk ago   Specimen Description WOUND   Special Requests LEFT FOOT WOUND SPEC B   Gram Stain MODERATE WBC PRESENT, PREDOMINANTLY PMN  FEW GRAM POSITIVE COCCI IN PAIRS IN CLUSTERS   Culture ABUNDANT STAPHYLOCOCCUS AUREUS  NO ANAEROBES ISOLATED  Performed at [facility], [place] [address], [place], [place] [address]   Report Status 09/24/2020 FINAL   Organism ID, Bacteria STAPHYLOCOCCUS AUREUS   Resulting Agency [facility]     Susceptibility   Staphylococcus aureus    MIC    CIPROFLOXACIN <=0.5 SENSI... Sensitive    CLINDAMYCIN RESISTANT  Resistant    ERYTHROMYCIN >=8 RESISTANT  Resistant    GENTAMICIN <=0.5 SENSI... Sensitive    Inducible Clindamycin POSITIVE  Resistant    OXACILLIN <=0.25 SENS... Sensitive    RIFAMPIN <=0.5 SENSI... Sensitive    TETRACYCLINE <=1 SENSITIVE  Sensitive    TRIMETH/SULFA <=10 SENSIT... Sensitive    VANCOMYCIN <=0.5 SENSI... Sensitive                  Treatments: IV hydration,  antibiotics: vancomycin and ceftriaxone, analgesia: acetaminophen, oxycodone and tramadol, anticoagulation: LMW heparin, therapies: PT, ST, and RN, and surgery: as above  Discharge Exam:  Physical Exam LLE      Minimal drainage, zero purulence, penrose removed Dressing intact, clean, dry             Edema/ swelling mild and improving             Sens: intact to each side of digits             Motor: gentle EHL, FHL, and lessor toe ext and flex all intact grossly except third             Brisk cap refill, warm to touch  Disposition: Discharge disposition: 01-Home or Self Care       Discharge Instructions     [facility] pharmacist to adjust dose for Vancomycin, Aminoglycosides and other  anti-infective therapies as requested by physician.   Complete by: As directed    [facility] to provide Cath Flo 94m   Complete by: As directed    Administer for PICC line occlusion and as ordered by physician for other access device issues.   Anaphylaxis Kit: Provided to treat any anaphylactic reaction to the medication being provided to the patient if First Dose or when requested by physician   Complete by: As directed    Epinephrine 186mml vial / amp: Administer 0.86m59m0.86ml37mubcutaneously once for moderate to severe anaphylaxis, nurse to call physician and pharmacy when reaction occurs and call 911 if needed for immediate care   Diphenhydramine 50mg69mIV vial: Administer 25-50mg 69mM PRN for first dose reaction, rash, itching, mild reaction, nurse to call physician and pharmacy when reaction occurs   Sodium Chloride 0.9% NS 500ml I39mdminister if needed for hypovolemic blood pressure drop or as ordered by physician after call to physician with anaphylactic reaction   Call MD / Call 911   Complete by: As directed    If you experience chest pain or shortness of breath, CALL 911 and be transported to the hospital emergency room.  If you develope a fever above 101 F, pus (white drainage) or increased drainage or redness at the wound, or calf pain, call your surgeon's office.   Change dressing on IV access line weekly and PRN   Complete by: As directed    Constipation Prevention   Complete by: As directed    Drink plenty of fluids.  Prune juice may be helpful.  You may use a stool softener, such as Colace (over the counter) 100 mg twice a day.  Use MiraLax (over the counter) for constipation as needed.   Diet general   Complete by: As directed    Discharge instructions   Complete by: As directed    Orthopaedic Trauma Service Discharge Instructions   General Discharge Instructions   WEIGHT BEARING STATUS: Weight-bear as tolerated in cam boot  RANGE OF MOTION/ACTIVITY:  Range of motion as tolerated right ankle.  Activity as tolerated.  Increase activity slowly  Wound Care: Daily wound care starting 09/24/2020.  Please see below.  Okay to shower and clean wounds with soap and water. Discharge Wound Care Instructions  Do NOT apply any ointments, solutions or lotions to pin sites or surgical wounds.  These prevent needed drainage and even though solutions like hydrogen peroxide kill bacteria, they also damage cells lining the pin sites that help fight infection.  Applying lotions or ointments can keep the wounds moist  and can cause them to breakdown and open up as well. This can increase the risk for infection. When in doubt call the office.  Surgical incisions should be dressed daily.  If any drainage is noted, use one layer of adaptic, then gauze, Kerlix, and an ace wrap.  Once the incision is completely dry and without drainage, it may be left open to air out.  Showering may begin 36-48 hours later.  Cleaning gently with soap and water.  Traumatic wounds should be dressed daily as well.    One layer of adaptic, gauze, Kerlix, then ace wrap.  The adaptic can be discontinued once the draining has ceased    If you have a wet to dry dressing: wet the gauze with saline the squeeze as much saline out so the gauze is moist (not soaking wet), place moistened gauze over wound, then place a dry gauze over the moist one, followed by Kerlix wrap, then ace wrap.  DVT/PE prophylaxis: Aspirin 325 mg daily  Diet: as you were eating previously.  Can use over the counter stool softeners and bowel preparations, such as Miralax, to help with bowel movements.  Narcotics can be constipating.  Be sure to drink plenty of fluids  PAIN MEDICATION USE AND EXPECTATIONS  You have likely been given narcotic medications to help control your pain.  After a traumatic event that results in an fracture (broken bone) with or without surgery, it is ok to use narcotic pain medications to help  control one's pain.  We understand that everyone responds to pain differently and each individual patient will be evaluated on a regular basis for the continued need for narcotic medications. Ideally, narcotic medication use should last no more than 6-8 weeks (coinciding with fracture healing).   As a patient it is your responsibility as well to monitor narcotic medication use and report the amount and frequency you use these medications when you come to your office visit.   We would also advise that if you are using narcotic medications, you should take a dose prior to therapy to maximize you participation.  IF YOU ARE ON NARCOTIC MEDICATIONS IT IS NOT PERMISSIBLE TO OPERATE A MOTOR VEHICLE (MOTORCYCLE/CAR/TRUCK/MOPED) OR HEAVY MACHINERY DO NOT MIX NARCOTICS WITH OTHER CNS (CENTRAL NERVOUS SYSTEM) DEPRESSANTS SUCH AS ALCOHOL   POST-OPERATIVE OPIOID TAPER INSTRUCTIONS:  It is important to wean off of your opioid medication as soon as possible. If you do not need pain medication after your surgery it is ok to stop day one.  Opioids include:  o Codeine, Hydrocodone(Norco, Vicodin), Oxycodone(Percocet, oxycontin) and hydromorphone amongst others.   Long term and even short term use of opiods can cause:  o Increased pain response  o Dependence  o Constipation  o Depression  o Respiratory depression  o And more.   Withdrawal symptoms can include  o Flu like symptoms  o Nausea, vomiting  o And more  Techniques to manage these symptoms  o Hydrate well  o Eat regular healthy meals  o Stay active  o Use relaxation techniques(deep breathing, meditating, yoga)  Do Not substitute Alcohol to help with tapering  If you have been on opioids for less than two weeks and do not have pain than it is ok to stop all together.   Plan to wean off of opioids  o This plan should start within one week post op of your fracture surgery   o Maintain the same interval or time between taking each dose and  first  decrease the dose.   o Cut the total daily intake of opioids by one tablet each day  o Next start to increase the time between doses.  o The last dose that should be eliminated is the evening dose.    STOP SMOKING OR USING NICOTINE PRODUCTS!!!!  As discussed nicotine severely impairs your body's ability to heal surgical and traumatic wounds but also impairs bone healing.  Wounds and bone heal by forming microscopic blood vessels (angiogenesis) and nicotine is a vasoconstrictor (essentially, shrinks blood vessels).  Therefore, if vasoconstriction occurs to these microscopic blood vessels they essentially disappear and are unable to deliver necessary nutrients to the healing tissue.  This is one modifiable factor that you can do to dramatically increase your chances of healing your injury.    (This means no smoking, no nicotine gum, patches, etc)  DO NOT USE NONSTEROIDAL ANTI-INFLAMMATORY DRUGS (NSAID'S)  Using products such as Advil (ibuprofen), Aleve (naproxen), Motrin (ibuprofen) for additional pain control during fracture healing can delay and/or prevent the healing response.  If you would like to take over the counter (OTC) medication, Tylenol (acetaminophen) is ok.  However, some narcotic medications that are given for pain control contain acetaminophen as well. Therefore, you should not exceed more than 4000 mg of tylenol in a day if you do not have liver disease.  Also note that there are may OTC medicines, such as cold medicines and allergy medicines that my contain tylenol as well.  If you have any questions about medications and/or interactions please ask your doctor/PA or your pharmacist.      ICE AND ELEVATE INJURED/OPERATIVE EXTREMITY  Using ice and elevating the injured extremity above your heart can help with swelling and pain control.  Icing in a pulsatile fashion, such as 20 minutes on and 20 minutes off, can be followed.    Do not place ice directly on skin. Make sure there is  a barrier between to skin and the ice pack.    Using frozen items such as frozen peas works well as the conform nicely to the are that needs to be iced.  USE AN ACE WRAP OR TED HOSE FOR SWELLING CONTROL  In addition to icing and elevation, Ace wraps or TED hose are used to help limit and resolve swelling.  It is recommended to use Ace wraps or TED hose until you are informed to stop.    When using Ace Wraps start the wrapping distally (farthest away from the body) and wrap proximally (closer to the body)   Example: If you had surgery on your leg or thing and you do not have a splint on, start the ace wrap at the toes and work your way up to the thigh        If you had surgery on your upper extremity and do not have a splint on, start the ace wrap at your fingers and work your way up to the upper arm  IF YOU ARE IN A SPLINT OR CAST DO NOT [place]   If your splint gets wet for any reason please contact the office immediately. You may shower in your splint or cast as long as you keep it dry.  This can be done by wrapping in a cast cover or garbage back (or similar)  Do Not stick any thing down your splint or cast such as pencils, money, or hangers to try and scratch yourself with.  If you feel itchy take benadryl as  prescribed on the bottle for itching  IF YOU ARE IN A CAM BOOT (BLACK BOOT)  You may remove boot periodically. Perform daily dressing changes as noted below.  Wash the liner of the boot regularly and wear a sock when wearing the boot. It is recommended that you sleep in the boot until told otherwise    Call office for the following: ? Temperature greater than 101F ? Persistent nausea and vomiting ? Severe uncontrolled pain ? Redness, tenderness, or signs of infection (pain, swelling, redness, odor or green/yellow discharge around the site) ? Difficulty breathing, headache or visual disturbances ? Hives ? Persistent dizziness or light-headedness ? Extreme  fatigue ? Any other questions or concerns you may have after discharge  In an emergency, call 911 or go to an Emergency Department at a nearby hospital  HELPFUL INFORMATION  ? If you had a block, it will wear off between 8-24 hrs postop typically.  This is period when your pain may go from nearly zero to the pain you would have had postop without the block.  This is an abrupt transition but nothing dangerous is happening.  You may take an extra dose of narcotic when this happens.  ? You should wean off your narcotic medicines as soon as you are able.  Most patients will be off or using minimal narcotics before their first postop appointment.   ? We suggest you use the pain medication the first night prior to going to bed, in order to ease any pain when the anesthesia wears off. You should avoid taking pain medications on an empty stomach as it will make you nauseous.  ? Do not drink alcoholic beverages or take illicit drugs when taking pain medications.  ? In most states it is against the law to drive while you are in a splint or sling.  And certainly against the law to drive while taking narcotics.  ? You may return to work/school in the next couple of days when you feel up to it.   ? Pain medication may make you constipated.  Below are a few solutions to try in this order:   ? Decrease the amount of pain medication if you aren't having pain.   ? Drink lots of decaffeinated fluids.   ? Drink prune juice and/or each dried prunes   o If the first 3 don't work start with additional solutions   ? Take Colace - an over-the-counter stool softener   ? Take Senokot - an over-the-counter laxative   ? Take Miralax - a stronger over-the-counter laxative     CALL THE OFFICE WITH ANY QUESTIONS OR CONCERNS: [phone]   VISIT OUR WEBSITE FOR ADDITIONAL INFORMATION: [email or web address]   Flush IV access with Sodium Chloride 0.9% and Heparin 10 units/ml or 100 units/ml   Complete by: As  directed    Home infusion instructions - [facility]   Complete by: As directed    Instructions: Flush IV access with Sodium Chloride 0.9% and Heparin 10units/ml or 100units/ml   Change dressing on IV access line: Weekly and PRN   Instructions Cath Flo 17m: Administer for PICC Line occlusion and as ordered by physician for other access device   [facility] pharmacist to adjust dose for: Vancomycin, Aminoglycosides and other anti-infective therapies as requested by physician   Increase activity slowly as tolerated   Complete by: As directed    Method of administration may be changed at the discretion of home infusion pharmacist based upon assessment  of the patient and/or caregiver's ability to self-administer the medication ordered   Complete by: As directed    Post-operative opioid taper instructions:   Complete by: As directed    POST-OPERATIVE OPIOID TAPER INSTRUCTIONS: It is important to wean off of your opioid medication as soon as possible. If you do not need pain medication after your surgery it is ok to stop day one. Opioids include: Codeine, Hydrocodone(Norco, Vicodin), Oxycodone(Percocet, oxycontin) and hydromorphone amongst others.  Long term and even short term use of opiods can cause: Increased pain response Dependence Constipation Depression Respiratory depression And more.  Withdrawal symptoms can include Flu like symptoms Nausea, vomiting And more Techniques to manage these symptoms Hydrate well Eat regular healthy meals Stay active Use relaxation techniques(deep breathing, meditating, yoga) Do Not substitute Alcohol to help with tapering If you have been on opioids for less than two weeks and do not have pain than it is ok to stop all together.  Plan to wean off of opioids This plan should start within one week post op of your joint replacement. Maintain the same interval or time between taking each dose and first decrease the dose.  Cut  the total daily intake of opioids by one tablet each day Next start to increase the time between doses. The last dose that should be eliminated is the evening dose.      Weight bearing as tolerated   Complete by: As directed       Allergies as of 09/23/2020       Reactions   Penicillins Hives, Swelling   Patient is Cephalosporin tolerant    Sulfa Antibiotics Hives        Medication List     STOP taking these medications    cefadroxil 500 MG capsule Commonly known as: DURICEF   oxyCODONE-acetaminophen 5-325 MG tablet Commonly known as: PERCOCET/ROXICET       TAKE these medications    acetaminophen 500 MG tablet Commonly known as: TYLENOL Take 2 tablets (1,000 mg total) by mouth every 8 (eight) hours.   aspirin EC 325 MG tablet Take 1 tablet (325 mg total) by mouth daily.   ceFAZolin  IVPB Commonly known as: ANCEF Inject 2 g into the vein every 8 (eight) hours. Indication:  Left ankle infection with hardware First Dose: Yes Last Day of Therapy:  11/02/20 Labs - Once weekly:  CBC/D and BMP, Labs - Every other week:  ESR and CRP Method of administration: IV Push Method of administration may be changed at the discretion of home infusion pharmacist based upon assessment of the patient and/or caregiver's ability to self-administer the medication ordered.   gabapentin 300 MG capsule Commonly known as: NEURONTIN Take 300 mg by mouth 3 (three) times daily.   lidocaine-prilocaine cream Commonly known as: EMLA Apply 1 application topically as needed. Port access   medroxyPROGESTERone 150 MG/ML injection Commonly known as: DEPO-PROVERA Inject 1 mL (150 mg total) into the muscle every 3 (three) months.   methocarbamol 500 MG tablet Commonly known as: Robaxin Take 1-2 tablets (500-1,000 mg total) by mouth every 8 (eight) hours as needed for muscle spasms. What changed:  how much to take when to take this   mycophenolate 180 MG EC tablet Commonly known as:  MYFORTIC Take 720 mg by mouth 2 (two) times daily.   ondansetron 4 MG disintegrating tablet Commonly known as: Zofran ODT Take 1 tablet (4 mg total) by mouth every 8 (eight) hours as needed for nausea or vomiting.  predniSONE 10 MG tablet Commonly known as: DELTASONE Take 10 mg by mouth daily with breakfast.   tacrolimus 1 MG capsule Commonly known as: PROGRAF Take 2 mg by mouth 2 (two) times daily.   traMADol 50 MG tablet Commonly known as: ULTRAM Take 1-2 tablets (50-100 mg total) by mouth every 8 (eight) hours as needed for moderate pain or severe pain.   VITAMIN D PO Take 1 capsule by mouth daily.       ASK your doctor about these medications    traZODone 50 MG tablet Commonly known as: DESYREL Take 1 tablet (50 mg total) by mouth at bedtime as needed for sleep.               Discharge Care Instructions  (From admission, onward)           Start     Ordered   09/23/20 0000  Weight bearing as tolerated        09/23/20 1104   09/22/20 0000  Change dressing on IV access line weekly and PRN  (Home infusion instructions - [facility] )        09/22/20 1215            Follow-up Information     Altamese [place], MD. Schedule an appointment as soon as possible for a visit in 10 day(s).   Specialty: Orthopedic Surgery Contact information: [place] [address] [phone]                 Discharge Instructions and Plan:   WBAT L leg  IV abx per ID recs  Follow up in 7 days Daily dressing changes   Signed:  Jari Pigg, PA-C [phone] (C) 09/23/2020, 11:04 AM  [facility] [place] [address] [phone] Domingo Sep (F)

## 2020-09-23 NOTE — Consult Note (Signed)
ORTHOPAEDIC CONSULTATION  REQUESTING PHYSICIAN: Altamese [place], MD  Chief Complaint: Deep infection of the left forefoot.  HPI: Tracy [name] is a 35 y.o. female who presents with deep infection left forefoot.  She is status post multiorgan transplant and is on immunotherapy with a history of diabetes.  Patient is status post internal fixation from a Lisfranc fracture dislocation developed a secondary infection in the forefoot underwent debridement surgery and is seen for evaluation.  Past Medical History:  Diagnosis Date   Abscess of left foot 09/20/2020   Abscess of right thigh    Anemia    presently on iron supplement   Anxiety    ASCUS with positive high risk human papillomavirus of vagina 05/01/2020   04/2020 ASCUS +HPV 16 and other will get Colpo    Atypical squamous cell changes of undetermined significance (ASCUS) on vaginal cytology with positive high risk human papilloma virus (HPV) 09/05/2016   Get colpo   Chronic kidney disease    on dialysis T, Th, Sat   CMV (cytomegalovirus infection) ([place])    Depression    Detached retina    Diabetes mellitus without complication ([place])    History of organ transplantation 08/18/2015   HSV-1 (herpes simplex virus 1) infection    HSV-2 (herpes simplex virus 2) infection    Hypertension    Hypothyroidism    Irregular periods 07/05/2014   Neuropathy    in feet   Osteomyelitis of left foot ([place]) 09/20/2020   Seizures ([place]) 01/2014   ? due to blood sugar   Thyroid enlargement    "not on medication at this time"   Urinary tract infection    hx of   Vaginal discharge 07/05/2014   Vaginal Pap smear, abnormal    Yeast infection    took diflucan saturday    Past Surgical History:  Procedure Laterality Date   APPLICATION OF A-CELL OF EXTREMITY Right 10/03/2014   Procedure: APPLICATION OF A-CELL OF RIGHT UPPER THIGH WOUND;  Surgeon: Renette Butters, MD;  Location: [facility];  Service: Orthopedics;  Laterality: Right;   APPLICATION  OF WOUND VAC Right 10/03/2014   Procedure: APPLICATION OF WOUND VAC RIGHT THIGH;  Surgeon: Renette Butters, MD;  Location: [facility];  Service: Orthopedics;  Laterality: Right;   AV FISTULA PLACEMENT Left 09/01/2014   Procedure: BRACHIOCEPHALIC ARTERIOVENOUS (AV) FISTULA CREATION;  Surgeon: Conrad Manati, MD;  Location: [facility];  Service: Vascular;  Laterality: Left;   CHOLECYSTECTOMY     COMBINED KIDNEY-PANCREAS TRANSPLANT  05/15/15   I & D EXTREMITY Right 08/14/2014   Procedure: IRRIGATION AND DEBRIDEMENT EXTREMITY WITH MUSCLE BIOPSY;  Surgeon: Roseanne Kaufman, MD;  Location: [facility];  Service: Orthopedics;  Laterality: Right;   I & D EXTREMITY Right 08/20/2014   Procedure:  I AND D LEG / THIGH ABSCESS AND MANIPULATION OF RIGHT ELBOW.;  Surgeon: Renette Butters, MD;  Location: [facility];  Service: Orthopedics;  Laterality: Right;   I & D EXTREMITY Right 09/30/2014   Procedure: IRRIGATION AND DEBRIDEMENT RIGHT THIGH ABSCESS;  Surgeon: [name], MD;  Location: [facility];  Service: Orthopedics;  Laterality: Right;   I & D EXTREMITY Right 10/03/2014   Procedure: IRRIGATION AND DEBRIDEMENT RIGHT THIGH ABSCESS,WITH WOUND CLOSURE & MANIPULATION OF RIGHT KNEE;  Surgeon: Renette Butters, MD;  Location: [facility];  Service: Orthopedics;  Laterality: Right;   I & D EXTREMITY Left 09/19/2020   Procedure: IRRIGATION AND DEBRIDEMENT EXTREMITY;  Surgeon: Altamese , MD;  Location: [facility];  Service: Orthopedics;  Laterality: Left;   I & D EXTREMITY Left 09/21/2020   Procedure: IRRIGATION AND DEBRIDEMENT EXTREMITY, partial excision bone 2nd metatarsal;  Surgeon: Altamese [place], MD;  Location: [facility];  Service: Orthopedics;  Laterality: Left;   INCISION AND DRAINAGE ABSCESS Right 12/09/2014   Procedure: INCISION AND DRAINAGE ABSCESS;  Surgeon: Renette Butters, MD;  Location: [facility];  Service: Orthopedics;  Laterality: Right;   INSERTION OF DIALYSIS CATHETER N/A 09/01/2014   Procedure: INSERTION OF DIALYSIS CATHETER IN RIGHT INTERNAL  JUGUILAR;  Surgeon: Conrad Sheridan, MD;  Location: [facility];  Service: Vascular;  Laterality: N/A;   KIDNEY TRANSPLANT  05/15/15   OPEN REDUCTION INTERNAL FIXATION (ORIF) FOOT LISFRANC FRACTURE Left 07/28/2020   Procedure: OPEN REDUCTION INTERNAL FIXATION (ORIF) FOOT LISFRANC FRACTURE;  Surgeon: Altamese Advance, MD;  Location: [facility];  Service: Orthopedics;  Laterality: Left;   ORIF TOE FRACTURE Left 07/28/2020   Procedure: OPEN REDUCTION INTERNAL FIXATION (ORIF) METATARSAL (TOE) FRACTURE;  Surgeon: Altamese Standish, MD;  Location: [facility];  Service: Orthopedics;  Laterality: Left;   PARS PLANA VITRECTOMY  10/01/2011   Procedure: PARS PLANA VITRECTOMY WITH 25 GAUGE;  Surgeon: Hayden Pedro, MD;  Location: [facility];  Service: Ophthalmology;  Laterality: Right;  Repair Complex Traction Retinal Detachment   PERCUTANEOUS PINNING Left 07/28/2020   Procedure: PERCUTANEOUS PINNING  2ND AND 3RD METATARSALS;  Surgeon: Altamese [place], MD;  Location: [facility];  Service: Orthopedics;  Laterality: Left;   PILONIDAL CYST EXCISION     TRIGGER FINGER RELEASE Right 09/21/13   WISDOM TOOTH EXTRACTION     Social History   Socioeconomic History   Marital status: Married    Spouse name: Audelia Acton   Number of children: 0   Years of education: College   Highest education level: Not on file  Occupational History    Employer: [employer]  Tobacco Use   Smoking status: Never   Smokeless tobacco: Never  Vaping Use   Vaping Use: Never used  Substance and Sexual Activity   Alcohol use: No    Alcohol/week: 0.0 standard drinks   Drug use: No   Sexual activity: Yes    Birth control/protection: Injection  Other Topics Concern   Not on file  Social History Narrative   Patient lives at home with family.   Caffeine Use: 1 soda daily   Social Determinants of Health   Financial Resource Strain: Low Risk    Difficulty of Paying Living Expenses: Not hard at all  Food Insecurity: No Food Insecurity   Worried About Charity fundraiser in the  Last Year: Never true   Ran Out of Food in the Last Year: Never true  Transportation Needs: No Transportation Needs   Lack of Transportation (Medical): No   Lack of Transportation (Non-Medical): No  Physical Activity: Sufficiently Active   Days of Exercise per Week: 7 days   Minutes of Exercise per Session: 90 min  Stress: Stress Concern Present   Feeling of Stress : To some extent  Social Connections: Engineer, building services of Communication with Friends and Family: More than three times a week   Frequency of Social Gatherings with Friends and Family: Twice a week   Attends Religious Services: More than 4 times per year   Active Member of [employer] or Organizations: Yes   Attends Music therapist: More than 4 times per year   Marital Status: Married   Family History  Problem  Relation Age of Onset   Cancer Paternal Grandfather        prostate   Hyperlipidemia Paternal Grandfather    Stroke Paternal Grandfather    - negative except otherwise stated in the family history section Allergies  Allergen Reactions   Penicillins Hives and Swelling    Patient is Cephalosporin tolerant    Sulfa Antibiotics Hives   Prior to Admission medications   Medication Sig Start Date End Date Taking? Authorizing Provider  cefadroxil (DURICEF) 500 MG capsule Take 500 mg by mouth 2 (two) times daily. 15 day supply 09/18/20  Yes [provider]  ceFAZolin (ANCEF) IVPB Inject 2 g into the vein every 8 (eight) hours. Indication:  Left ankle infection with hardware First Dose: Yes Last Day of Therapy:  11/02/20 Labs - Once weekly:  CBC/D and BMP, Labs - Every other week:  ESR and CRP Method of administration: IV Push Method of administration may be changed at the discretion of home infusion pharmacist based upon assessment of the patient and/or caregiver's ability to self-administer the medication ordered. 09/22/20 11/03/20 Yes Ainsley Spinner, PA-C  gabapentin (NEURONTIN) 300 MG  capsule Take 300 mg by mouth 3 (three) times daily. 09/07/20  Yes [provider]  lidocaine-prilocaine (EMLA) cream Apply 1 application topically as needed. Port access 09/15/20  Yes [provider]  medroxyPROGESTERone (DEPO-PROVERA) 150 MG/ML injection Inject 1 mL (150 mg total) into the muscle every 3 (three) months. 07/28/20  Yes Ainsley Spinner, PA-C  methocarbamol (ROBAXIN) 500 MG tablet Take 1 tablet (500 mg total) by mouth every 6 (six) hours as needed for muscle spasms. 07/28/20  Yes Ainsley Spinner, PA-C  mycophenolate (MYFORTIC) 180 MG EC tablet Take 720 mg by mouth 2 (two) times daily. 08/09/15  Yes [provider]  ondansetron (ZOFRAN ODT) 4 MG disintegrating tablet Take 1 tablet (4 mg total) by mouth every 8 (eight) hours as needed for nausea or vomiting. 07/28/20  Yes Ainsley Spinner, PA-C  oxyCODONE-acetaminophen (PERCOCET/ROXICET) 5-325 MG tablet Take 1 tablet by mouth every 6 (six) hours as needed for pain. 07/24/20  Yes [provider]  predniSONE (DELTASONE) 10 MG tablet Take 10 mg by mouth daily with breakfast.   Yes [provider]  tacrolimus (PROGRAF) 1 MG capsule Take 2 mg by mouth 2 (two) times daily. 08/09/15  Yes [provider]  traZODone (DESYREL) 50 MG tablet Take 1 tablet (50 mg total) by mouth at bedtime as needed for sleep. Patient taking differently: Take 50-100 mg by mouth at bedtime as needed for sleep. 07/28/20  Yes Ainsley Spinner, PA-C  VITAMIN D PO Take 1 capsule by mouth daily.   Yes [provider]   DG Foot Complete Left  Result Date: 09/22/2020 CLINICAL DATA:  Patient was thrown from a horse and had surgery on 07/28/2020. Rods were removed 09/18/2020, now suffering from infection EXAM: LEFT FOOT - COMPLETE 3+ VIEW COMPARISON:  Foot radiograph 07/28/2020 FINDINGS: Overlying splint material obscures fine bone and soft-tissue detail. The patient is status post sideplate and screw fixation of the great toe and medial  cuneiform. Hardware alignment is similar to the prior radiographs, without evidence of hardware fracture or failure. The fracture lucency in the great toe metatarsal diaphysis is similar alignment. Previously seen pin fixation through the second and third metatarsals has been removed. The fracture alignment is grossly similar to the prior study. The fractures of the fourth and fifth metatarsals are also grossly similar in alignment. There is no definite soft  tissue gas, though evaluation is degraded by overlying splint material. IMPRESSION: 1. Stable alignment of the sideplate and screw fixation across the great toe tarsometatarsal joint. Similar alignment of the metatarsal fracture. 2. Grossly similar alignment of the second through fifth metatarsal fractures with interval removal of the pin fixation of the second and third metatarsals. 3. No definite soft tissue gas, though evaluation is degraded by overlying splint material. Electronically Signed   By: Valetta Mole MD   On: 09/22/2020 12:40   VAS [place] ABI WITH/WO TBI  Result Date: 09/22/2020  LOWER EXTREMITY DOPPLER STUDY Patient Name:  Tracy [name]  Date of Exam:   09/22/2020 Medical Rec #: [id number]        Accession #:    [id number] Date of Birth: 1985-04-20        Patient Gender: F Patient Age:   1 years Exam Location:  [facility] Procedure:      VAS [place] ABI WITH/WO TBI Referring Phys: Alekxander Isola --------------------------------------------------------------------------------  Indications: Gangrene, and PVD. High Risk Factors: Hypertension, Diabetes.  Limitations: Today's exam was limited due to patient intolerant to cuff              pressure. Comparison Study: no prior Performing Technologist: Archie Patten RVS  Examination Guidelines: A complete evaluation includes at minimum, Doppler waveform signals and systolic blood pressure reading at the level of bilateral brachial, anterior tibial, and posterior tibial arteries, when vessel segments  are accessible. Bilateral testing is considered an integral part of a complete examination. Photoelectric Plethysmograph (PPG) waveforms and toe systolic pressure readings are included as required and additional duplex testing as needed. Limited examinations for reoccurring indications may be performed as noted.  ABI Findings: +---------+------------------+-----+---------+--------+ Right    Rt Pressure (mmHg)IndexWaveform Comment  +---------+------------------+-----+---------+--------+ Brachial 132                    triphasic         +---------+------------------+-----+---------+--------+ PTA      255               1.93 triphasic         +---------+------------------+-----+---------+--------+ DP       255               1.93 triphasic         +---------+------------------+-----+---------+--------+ Great Toe122               0.92 Normal            +---------+------------------+-----+---------+--------+ +--------+------------------+-----+---------+--------------------+ Left    Lt Pressure (mmHg)IndexWaveform Comment              +--------+------------------+-----+---------+--------------------+ Brachial                       triphasicrestricted extremity +--------+------------------+-----+---------+--------------------+ PTA                            triphasic                     +--------+------------------+-----+---------+--------------------+ DP                             triphasic                     +--------+------------------+-----+---------+--------------------+ +-------+-----------+-----------+------------+------------+ ABI/TBIToday's ABIToday's TBIPrevious ABIPrevious TBI +-------+-----------+-----------+------------+------------+ Right  1.93       0.92                                +-------+-----------+-----------+------------+------------+  Summary: Right: Resting right ankle-brachial index indicates noncompressible right lower extremity  arteries. The right toe-brachial index is normal. Left: Unable to obtain pressures due to pain tolerance.  *See table(s) above for measurements and observations.     Preliminary    - pertinent xrays, CT, MRI studies were reviewed and independently interpreted  Positive ROS: All other systems have been reviewed and were otherwise negative with the exception of those mentioned in the HPI and as above.  Physical Exam: General: Alert, no acute distress Psychiatric: Patient is competent for consent with normal mood and affect Lymphatic: No axillary or cervical lymphadenopathy Cardiovascular: No pedal edema Respiratory: No cyanosis, no use of accessory musculature GI: No organomegaly, abdomen is soft and non-tender    Images:  '@ENCIMAGES' @  Labs:  Lab Results  Component Value Date   HGBA1C 6.0 (H) 09/19/2020   HGBA1C 8.9 (H) 08/10/2014   HGBA1C 16.8 (H) 01/20/2013   ESRSEDRATE 93 (H) 09/19/2020   ESRSEDRATE 12 11/09/2014   ESRSEDRATE 112 (H) 08/09/2014   CRP 25.7 (H) 09/19/2020   CRP <0.5 11/09/2014   CRP 28.5 (H) 08/09/2014   REPTSTATUS PENDING 09/19/2020   GRAMSTAIN  09/19/2020    MODERATE WBC PRESENT, PREDOMINANTLY PMN FEW GRAM POSITIVE COCCI IN PAIRS IN CLUSTERS Performed at [facility], [place] [address] [place], [place] [address]    CULT  09/19/2020    ABUNDANT STAPHYLOCOCCUS AUREUS NO ANAEROBES ISOLATED; CULTURE IN PROGRESS FOR 5 DAYS    LABORGA STAPHYLOCOCCUS AUREUS 09/19/2020    Lab Results  Component Value Date   ALBUMIN 2.2 (L) 09/23/2020   ALBUMIN 2.3 (L) 09/20/2020   ALBUMIN 3.2 (L) 07/28/2020     CBC EXTENDED Latest Ref Rng & Units 09/23/2020 07/28/2020 12/09/2014  WBC 4.0 - 10.5 K/uL 9.1 7.3 -  RBC 3.87 - 5.11 MIL/uL 3.16(L) 4.40 -  HGB 12.0 - 15.0 g/dL 8.5(L) 12.4 10.9(L)  HCT 36.0 - 46.0 % 28.4(L) 40.2 32.0(L)  PLT 150 - 400 K/uL 246 219 -  NEUTROABS 1.7 - 7.7 K/uL - 5.5 -  LYMPHSABS 0.7 - 4.0 K/uL - 1.1 -    Neurologic: Patient does not  have protective sensation bilateral lower extremities.   MUSCULOSKELETAL:   Skin: Examination of the surgical incision is well approximated there is no cellulitis no drainage.  Patient has a palpable popliteal pulses as well as a palpable anterior tibial and posterior tibial pulse.  Review of the ankle-brachial indices does show elevated pressures secondary to calcification but she does have triphasic flow.  Patient's infection is secondary to her microcirculatory calcification.  Patient's hemoglobin A1c is 6.0.  Albumin 2.2.    Review of the culture shows staph aureus from the tissue samples.    Assessment: Assessment: Status post debridement abscess left forefoot secondary to staph aureus infection, secondary to calcified microcirculation.  Plan: Plan.  Anticipate with the infection that went down to bone with her micro circulatory calcification that I agree with IV antibiotics at discharge.  I will be available to follow-up for wound care if necessary.  Thank you for the consult and the opportunity to see Ms. [place], MD [place] [phone] 9:20 AM

## 2020-09-23 NOTE — Plan of Care (Signed)
  Problem: Education: Goal: Knowledge of General Education information will improve Description: Including pain rating scale, medication(s)/side effects and non-pharmacologic comfort measures Outcome: Progressing   Problem: Health Behavior/Discharge Planning: Goal: Ability to manage health-related needs will improve Outcome: Progressing   Problem: Clinical Measurements: Goal: Ability to maintain clinical measurements within normal limits will improve Outcome: Progressing Goal: Respiratory complications will improve Outcome: Progressing   Problem: Activity: Goal: Risk for activity intolerance will decrease Outcome: Progressing   Problem: Coping: Goal: Level of anxiety will decrease Outcome: Progressing   Problem: Pain Managment: Goal: General experience of comfort will improve Outcome: Progressing   Problem: Safety: Goal: Ability to remain free from injury will improve Outcome: Progressing   Problem: Skin Integrity: Goal: Risk for impaired skin integrity will decrease Outcome: Progressing

## 2020-09-23 NOTE — Progress Notes (Signed)
AVS given and reviewed with pt. Medications discussed. All questions answered to satisfaction. Pt verbalized understanding of information given. Pt escorted off the unit with all belongings via wheelchair by this RN.

## 2020-09-23 NOTE — Discharge Instructions (Addendum)
[facility] Discharge Instructions   General Discharge Instructions   WEIGHT BEARING STATUS: Weight-bear as tolerated in cam boot  RANGE OF MOTION/ACTIVITY: Range of motion as tolerated right ankle.  Activity as tolerated.  Increase activity slowly  Wound Care: Daily wound care starting 09/24/2020.  Please see below.  Okay to shower and clean wounds with soap and water. Discharge Wound Care Instructions  Do NOT apply any ointments, solutions or lotions to pin sites or surgical wounds.  These prevent needed drainage and even though solutions like hydrogen peroxide kill bacteria, they also damage cells lining the pin sites that help fight infection.  Applying lotions or ointments can keep the wounds moist and can cause them to breakdown and open up as well. This can increase the risk for infection. When in doubt call the office.  Surgical incisions should be dressed daily.  If any drainage is noted, use one layer of adaptic, then gauze, Kerlix, and an ace wrap.  Once the incision is completely dry and without drainage, it may be left open to air out.  Showering may begin 36-48 hours later.  Cleaning gently with soap and water.  Traumatic wounds should be dressed daily as well.    One layer of adaptic, gauze, Kerlix, then ace wrap.  The adaptic can be discontinued once the draining has ceased    If you have a wet to dry dressing: wet the gauze with saline the squeeze as much saline out so the gauze is moist (not soaking wet), place moistened gauze over wound, then place a dry gauze over the moist one, followed by Kerlix wrap, then ace wrap.  DVT/PE prophylaxis: Aspirin 325 mg daily  Diet: as you were eating previously.  Can use over the counter stool softeners and bowel preparations, such as Miralax, to help with bowel movements.  Narcotics can be constipating.  Be sure to drink plenty of fluids  PAIN MEDICATION USE AND EXPECTATIONS  You have likely been given narcotic  medications to help control your pain.  After a traumatic event that results in an fracture (broken bone) with or without surgery, it is ok to use narcotic pain medications to help control one's pain.  We understand that everyone responds to pain differently and each individual patient will be evaluated on a regular basis for the continued need for narcotic medications. Ideally, narcotic medication use should last no more than 6-8 weeks (coinciding with fracture healing).   As a patient it is your responsibility as well to monitor narcotic medication use and report the amount and frequency you use these medications when you come to your office visit.   We would also advise that if you are using narcotic medications, you should take a dose prior to therapy to maximize you participation.  IF YOU ARE ON NARCOTIC MEDICATIONS IT IS NOT PERMISSIBLE TO OPERATE A MOTOR VEHICLE (MOTORCYCLE/CAR/TRUCK/MOPED) OR HEAVY MACHINERY DO NOT MIX NARCOTICS WITH OTHER CNS (CENTRAL NERVOUS SYSTEM) DEPRESSANTS SUCH AS ALCOHOL   POST-OPERATIVE OPIOID TAPER INSTRUCTIONS: It is important to wean off of your opioid medication as soon as possible. If you do not need pain medication after your surgery it is ok to stop day one. Opioids include: Codeine, Hydrocodone(Norco, Vicodin), Oxycodone(Percocet, oxycontin) and hydromorphone amongst others.  Long term and even short term use of opiods can cause: Increased pain response Dependence Constipation Depression Respiratory depression And more.  Withdrawal symptoms can include Flu like symptoms Nausea, vomiting And more Techniques to manage these symptoms Hydrate  well Eat regular healthy meals Stay active Use relaxation techniques(deep breathing, meditating, yoga) Do Not substitute Alcohol to help with tapering If you have been on opioids for less than two weeks and do not have pain than it is ok to stop all together.  Plan to wean off of opioids This plan should start  within one week post op of your fracture surgery  Maintain the same interval or time between taking each dose and first decrease the dose.  Cut the total daily intake of opioids by one tablet each day Next start to increase the time between doses. The last dose that should be eliminated is the evening dose.    STOP SMOKING OR USING NICOTINE PRODUCTS!!!!  As discussed nicotine severely impairs your body's ability to heal surgical and traumatic wounds but also impairs bone healing.  Wounds and bone heal by forming microscopic blood vessels (angiogenesis) and nicotine is a vasoconstrictor (essentially, shrinks blood vessels).  Therefore, if vasoconstriction occurs to these microscopic blood vessels they essentially disappear and are unable to deliver necessary nutrients to the healing tissue.  This is one modifiable factor that you can do to dramatically increase your chances of healing your injury.    (This means no smoking, no nicotine gum, patches, etc)  DO NOT USE NONSTEROIDAL ANTI-INFLAMMATORY DRUGS (NSAID'S)  Using products such as Advil (ibuprofen), Aleve (naproxen), Motrin (ibuprofen) for additional pain control during fracture healing can delay and/or prevent the healing response.  If you would like to take over the counter (OTC) medication, Tylenol (acetaminophen) is ok.  However, some narcotic medications that are given for pain control contain acetaminophen as well. Therefore, you should not exceed more than 4000 mg of tylenol in a day if you do not have liver disease.  Also note that there are may OTC medicines, such as cold medicines and allergy medicines that my contain tylenol as well.  If you have any questions about medications and/or interactions please ask your doctor/PA or your pharmacist.      ICE AND ELEVATE INJURED/OPERATIVE EXTREMITY  Using ice and elevating the injured extremity above your heart can help with swelling and pain control.  Icing in a pulsatile fashion, such as 20  minutes on and 20 minutes off, can be followed.    Do not place ice directly on skin. Make sure there is a barrier between to skin and the ice pack.    Using frozen items such as frozen peas works well as the conform nicely to the are that needs to be iced.  USE AN ACE WRAP OR TED HOSE FOR SWELLING CONTROL  In addition to icing and elevation, Ace wraps or TED hose are used to help limit and resolve swelling.  It is recommended to use Ace wraps or TED hose until you are informed to stop.    When using Ace Wraps start the wrapping distally (farthest away from the body) and wrap proximally (closer to the body)   Example: If you had surgery on your leg or thing and you do not have a splint on, start the ace wrap at the toes and work your way up to the thigh        If you had surgery on your upper extremity and do not have a splint on, start the ace wrap at your fingers and work your way up to the upper arm  IF YOU ARE IN A SPLINT OR CAST DO NOT [place]   If your  splint gets wet for any reason please contact the office immediately. You may shower in your splint or cast as long as you keep it dry.  This can be done by wrapping in a cast cover or garbage back (or similar)  Do Not stick any thing down your splint or cast such as pencils, money, or hangers to try and scratch yourself with.  If you feel itchy take benadryl as prescribed on the bottle for itching  IF YOU ARE IN A CAM BOOT (BLACK BOOT)  You may remove boot periodically. Perform daily dressing changes as noted below.  Wash the liner of the boot regularly and wear a sock when wearing the boot. It is recommended that you sleep in the boot until told otherwise    Call office for the following: Temperature greater than 101F Persistent nausea and vomiting Severe uncontrolled pain Redness, tenderness, or signs of infection (pain, swelling, redness, odor or green/yellow discharge around the site) Difficulty breathing, headache  or visual disturbances Hives Persistent dizziness or light-headedness Extreme fatigue Any other questions or concerns you may have after discharge  In an emergency, call 911 or go to an Emergency Department at a nearby hospital  HELPFUL INFORMATION  If you had a block, it will wear off between 8-24 hrs postop typically.  This is period when your pain may go from nearly zero to the pain you would have had postop without the block.  This is an abrupt transition but nothing dangerous is happening.  You may take an extra dose of narcotic when this happens.  You should wean off your narcotic medicines as soon as you are able.  Most patients will be off or using minimal narcotics before their first postop appointment.   We suggest you use the pain medication the first night prior to going to bed, in order to ease any pain when the anesthesia wears off. You should avoid taking pain medications on an empty stomach as it will make you nauseous.  Do not drink alcoholic beverages or take illicit drugs when taking pain medications.  In most states it is against the law to drive while you are in a splint or sling.  And certainly against the law to drive while taking narcotics.  You may return to work/school in the next couple of days when you feel up to it.   Pain medication may make you constipated.  Below are a few solutions to try in this order: Decrease the amount of pain medication if you aren't having pain. Drink lots of decaffeinated fluids. Drink prune juice and/or each dried prunes  If the first 3 don't work start with additional solutions Take Colace - an over-the-counter stool softener Take Senokot - an over-the-counter laxative Take Miralax - a stronger over-the-counter laxative     CALL THE OFFICE WITH ANY QUESTIONS OR CONCERNS: [phone]   VISIT OUR WEBSITE FOR ADDITIONAL INFORMATION: [email or web address]

## 2020-09-23 NOTE — TOC Transition Note (Signed)
Transition of Care [facility]) - CM/SW Discharge Note   Patient Details  Name: Tracy [name] MRN: [id number] Date of Birth: 02/19/1985  [facility] CM/SW Contact:  Bartholomew Crews, RN Phone Number: [phone] 09/23/2020, 11:44 AM   Clinical Narrative:     Patient to transition home today. Spoke with Pam at [employer]. IV medications have been delivered to the home and patient has been trained. Patient's port to be access prior to transition home for purposes of home infusion needs. Nursing aware.   Final next level of care: [place] Barriers to Discharge: No Barriers Identified   Patient Goals and CMS Choice Patient states their goals for this hospitalization and ongoing recovery are:: to go home CMS Medicare.gov Compare Post Acute Care list provided to:: Patient Choice offered to / list presented to : Patient  Discharge Placement                       Discharge Plan and Services   Discharge Planning Services: CM Consult Post Acute Care Choice: Home Health          DME Arranged: N/A DME Agency: NA       HH Arranged: RN HH Agency: [facility] Date HH Agency Contacted: 09/23/20 Time [place]: [id number] Representative spoke with at [place]: [place] ([place]) Interventions     Readmission Risk Interventions No flowsheet data found.

## 2020-09-24 DIAGNOSIS — M86172 Other acute osteomyelitis, left ankle and foot: Secondary | ICD-10-CM | POA: Diagnosis not present

## 2020-09-24 DIAGNOSIS — Z949 Transplanted organ and tissue status, unspecified: Secondary | ICD-10-CM | POA: Diagnosis not present

## 2020-09-24 DIAGNOSIS — L02612 Cutaneous abscess of left foot: Secondary | ICD-10-CM | POA: Diagnosis not present

## 2020-09-24 LAB — AEROBIC/ANAEROBIC CULTURE W GRAM STAIN (SURGICAL/DEEP WOUND): Gram Stain: NONE SEEN

## 2020-09-25 DIAGNOSIS — L02612 Cutaneous abscess of left foot: Secondary | ICD-10-CM | POA: Diagnosis not present

## 2020-09-25 DIAGNOSIS — M86172 Other acute osteomyelitis, left ankle and foot: Secondary | ICD-10-CM | POA: Diagnosis not present

## 2020-09-25 DIAGNOSIS — Z949 Transplanted organ and tissue status, unspecified: Secondary | ICD-10-CM | POA: Diagnosis not present

## 2020-09-26 DIAGNOSIS — Z949 Transplanted organ and tissue status, unspecified: Secondary | ICD-10-CM | POA: Diagnosis not present

## 2020-09-26 DIAGNOSIS — M86172 Other acute osteomyelitis, left ankle and foot: Secondary | ICD-10-CM | POA: Diagnosis not present

## 2020-09-26 DIAGNOSIS — L02612 Cutaneous abscess of left foot: Secondary | ICD-10-CM | POA: Diagnosis not present

## 2020-09-27 DIAGNOSIS — N390 Urinary tract infection, site not specified: Secondary | ICD-10-CM | POA: Diagnosis not present

## 2020-09-27 DIAGNOSIS — Z7952 Long term (current) use of systemic steroids: Secondary | ICD-10-CM | POA: Diagnosis not present

## 2020-09-27 DIAGNOSIS — S92332D Displaced fracture of third metatarsal bone, left foot, subsequent encounter for fracture with routine healing: Secondary | ICD-10-CM | POA: Diagnosis not present

## 2020-09-27 DIAGNOSIS — L02612 Cutaneous abscess of left foot: Secondary | ICD-10-CM | POA: Diagnosis not present

## 2020-09-27 DIAGNOSIS — Z79899 Other long term (current) drug therapy: Secondary | ICD-10-CM | POA: Diagnosis not present

## 2020-09-27 DIAGNOSIS — Z48288 Encounter for aftercare following multiple organ transplant: Secondary | ICD-10-CM | POA: Diagnosis not present

## 2020-09-27 DIAGNOSIS — M86172 Other acute osteomyelitis, left ankle and foot: Secondary | ICD-10-CM | POA: Diagnosis not present

## 2020-09-27 DIAGNOSIS — I951 Orthostatic hypotension: Secondary | ICD-10-CM | POA: Diagnosis not present

## 2020-09-27 DIAGNOSIS — S92352D Displaced fracture of fifth metatarsal bone, left foot, subsequent encounter for fracture with routine healing: Secondary | ICD-10-CM | POA: Diagnosis not present

## 2020-09-27 DIAGNOSIS — E109 Type 1 diabetes mellitus without complications: Secondary | ICD-10-CM | POA: Diagnosis not present

## 2020-09-27 DIAGNOSIS — E039 Hypothyroidism, unspecified: Secondary | ICD-10-CM | POA: Diagnosis not present

## 2020-09-27 DIAGNOSIS — Z949 Transplanted organ and tissue status, unspecified: Secondary | ICD-10-CM | POA: Diagnosis not present

## 2020-09-27 DIAGNOSIS — S92312D Displaced fracture of first metatarsal bone, left foot, subsequent encounter for fracture with routine healing: Secondary | ICD-10-CM | POA: Diagnosis not present

## 2020-09-27 DIAGNOSIS — S92342D Displaced fracture of fourth metatarsal bone, left foot, subsequent encounter for fracture with routine healing: Secondary | ICD-10-CM | POA: Diagnosis not present

## 2020-09-28 DIAGNOSIS — L02612 Cutaneous abscess of left foot: Secondary | ICD-10-CM | POA: Diagnosis not present

## 2020-09-28 DIAGNOSIS — M86172 Other acute osteomyelitis, left ankle and foot: Secondary | ICD-10-CM | POA: Diagnosis not present

## 2020-09-28 DIAGNOSIS — Z949 Transplanted organ and tissue status, unspecified: Secondary | ICD-10-CM | POA: Diagnosis not present

## 2020-09-29 DIAGNOSIS — L02612 Cutaneous abscess of left foot: Secondary | ICD-10-CM | POA: Diagnosis not present

## 2020-09-29 DIAGNOSIS — M86172 Other acute osteomyelitis, left ankle and foot: Secondary | ICD-10-CM | POA: Diagnosis not present

## 2020-09-29 DIAGNOSIS — Z949 Transplanted organ and tissue status, unspecified: Secondary | ICD-10-CM | POA: Diagnosis not present

## 2020-09-30 DIAGNOSIS — M86172 Other acute osteomyelitis, left ankle and foot: Secondary | ICD-10-CM | POA: Diagnosis not present

## 2020-09-30 DIAGNOSIS — L02612 Cutaneous abscess of left foot: Secondary | ICD-10-CM | POA: Diagnosis not present

## 2020-09-30 DIAGNOSIS — Z949 Transplanted organ and tissue status, unspecified: Secondary | ICD-10-CM | POA: Diagnosis not present

## 2020-10-01 DIAGNOSIS — Z949 Transplanted organ and tissue status, unspecified: Secondary | ICD-10-CM | POA: Diagnosis not present

## 2020-10-01 DIAGNOSIS — M86172 Other acute osteomyelitis, left ankle and foot: Secondary | ICD-10-CM | POA: Diagnosis not present

## 2020-10-01 DIAGNOSIS — L02612 Cutaneous abscess of left foot: Secondary | ICD-10-CM | POA: Diagnosis not present

## 2020-10-02 DIAGNOSIS — Z949 Transplanted organ and tissue status, unspecified: Secondary | ICD-10-CM | POA: Diagnosis not present

## 2020-10-02 DIAGNOSIS — L02612 Cutaneous abscess of left foot: Secondary | ICD-10-CM | POA: Diagnosis not present

## 2020-10-02 DIAGNOSIS — M86172 Other acute osteomyelitis, left ankle and foot: Secondary | ICD-10-CM | POA: Diagnosis not present

## 2020-10-03 DIAGNOSIS — L02612 Cutaneous abscess of left foot: Secondary | ICD-10-CM | POA: Diagnosis not present

## 2020-10-03 DIAGNOSIS — M86172 Other acute osteomyelitis, left ankle and foot: Secondary | ICD-10-CM | POA: Diagnosis not present

## 2020-10-03 DIAGNOSIS — Z949 Transplanted organ and tissue status, unspecified: Secondary | ICD-10-CM | POA: Diagnosis not present

## 2020-10-04 ENCOUNTER — Ambulatory Visit (INDEPENDENT_AMBULATORY_CARE_PROVIDER_SITE_OTHER): Payer: BC Managed Care – PPO | Admitting: Infectious Diseases

## 2020-10-04 ENCOUNTER — Other Ambulatory Visit: Payer: Self-pay

## 2020-10-04 ENCOUNTER — Encounter: Payer: Self-pay | Admitting: Infectious Diseases

## 2020-10-04 VITALS — BP 149/88 | HR 78 | Temp 98.0°F

## 2020-10-04 DIAGNOSIS — T847XXA Infection and inflammatory reaction due to other internal orthopedic prosthetic devices, implants and grafts, initial encounter: Secondary | ICD-10-CM | POA: Insufficient documentation

## 2020-10-04 DIAGNOSIS — T847XXD Infection and inflammatory reaction due to other internal orthopedic prosthetic devices, implants and grafts, subsequent encounter: Secondary | ICD-10-CM

## 2020-10-04 DIAGNOSIS — M86172 Other acute osteomyelitis, left ankle and foot: Secondary | ICD-10-CM | POA: Diagnosis not present

## 2020-10-04 DIAGNOSIS — Z5181 Encounter for therapeutic drug level monitoring: Secondary | ICD-10-CM | POA: Diagnosis not present

## 2020-10-04 DIAGNOSIS — Z949 Transplanted organ and tissue status, unspecified: Secondary | ICD-10-CM | POA: Diagnosis not present

## 2020-10-04 DIAGNOSIS — L02612 Cutaneous abscess of left foot: Secondary | ICD-10-CM | POA: Diagnosis not present

## 2020-10-04 NOTE — Progress Notes (Signed)
Labs 10/03/20 WBC 6.9, hb 9.6, platelets 241 , Cr 1.26

## 2020-10-04 NOTE — Progress Notes (Addendum)
[facility]                                                             [address], [place], [place], [address]                                                                  Phn. [phone]; Fax: [phone]                                                                             Date: 10/03/20  Reason for Referral: Hardware associated osteomyelitis  Requesting  Provider: HFU   Assessment Problem List Items Addressed This Visit       Musculoskeletal and Integument   Osteomyelitis of left foot ([place]) - Primary     Other   Hardware complicating wound infection ([place])   Medication monitoring encounter    Left ankle infection associated with hardware/left foot abscess and osteomyelitis - s/p left ORIF and pinning Lisfranc fracture 07/28/2020  - OR notes reviewed. Discussed with Orthopedics with concerns for osteomyelitis - OR cultures 09/19/20 growing MSSA    2. Type 1 DM  3. Combined kidney and pancreatic transplant   Plan Continue cefazolin as is. Tentative end date for 6 weeks is 11/02/20 Will request labs from [facility] for review Follow up with me around end date of abtx on 11/02/20 Follow up with Dr Marcelino Scot as instructed   All questions and concerns were discussed and addressed. Patient verbalized understanding of the plan. ____________________________________________________________________________________________________________________  HPI/Interval events Here for HFU for hardware associated osteomyelitis of her left ankle. She is receiving IV cefazolin from her port. Denies any issues with port. Port site is non erythematous and non tender. She denies any issues with her IV abtx like nausea, vomiting, abdominal pain and diarrhea. She tells me she has a follow up later this afternoon with Dr Marcelino Scot. No complaints otherwise.   ROS: Constitutional: Negative for fever, chills,  activity change, appetite change, fatigue and unexpected weight change.  Respiratory: Negative for cough, shortness of breath Cardiovascular: Negative for chest pain, palpitations and leg swelling.  Gastrointestinal: Negative for nausea, vomiting, abdominal pain, diarrhea/constipation, .  Genitourinary: Negative for dysuria, hematuria, flank pain Musculoskeletal: Negative for myalgias, arthralgia, back pain, joint swelling, arthralgias Skin: Negative for rashes, lesions  Neurological: Negative for weakness, dizziness or headache  Past Medical History:  Diagnosis Date   Abscess of left foot 09/20/2020   Abscess of right thigh    Anemia    presently on iron supplement   Anxiety    ASCUS with positive high risk human papillomavirus of vagina 05/01/2020   04/2020 ASCUS +HPV 16 and other will get Colpo    Atypical squamous cell changes of undetermined significance (ASCUS) on  vaginal cytology with positive high risk human papilloma virus (HPV) 09/05/2016   Get colpo   Chronic kidney disease    on dialysis T, Th, Sat   CMV (cytomegalovirus infection) ([place])    Depression    Detached retina    Diabetes mellitus without complication ([place])    History of organ transplantation 08/18/2015   HSV-1 (herpes simplex virus 1) infection    HSV-2 (herpes simplex virus 2) infection    Hypertension    Hypothyroidism    Irregular periods 07/05/2014   Neuropathy    in feet   Osteomyelitis of left foot ([place]) 09/20/2020   Seizures ([place]) 01/2014   ? due to blood sugar   Thyroid enlargement    "not on medication at this time"   Urinary tract infection    hx of   Vaginal discharge 07/05/2014   Vaginal Pap smear, abnormal    Yeast infection    took diflucan saturday    Past Surgical History:  Procedure Laterality Date   APPLICATION OF A-CELL OF EXTREMITY Right 10/03/2014   Procedure: APPLICATION OF A-CELL OF RIGHT UPPER THIGH WOUND;  Surgeon: Renette Butters, MD;  Location: [facility];  Service:  Orthopedics;  Laterality: Right;   APPLICATION OF WOUND VAC Right 10/03/2014   Procedure: APPLICATION OF WOUND VAC RIGHT THIGH;  Surgeon: Renette Butters, MD;  Location: [facility];  Service: Orthopedics;  Laterality: Right;   AV FISTULA PLACEMENT Left 09/01/2014   Procedure: BRACHIOCEPHALIC ARTERIOVENOUS (AV) FISTULA CREATION;  Surgeon: Conrad Brewster, MD;  Location: [facility];  Service: Vascular;  Laterality: Left;   CHOLECYSTECTOMY     COMBINED KIDNEY-PANCREAS TRANSPLANT  05/15/15   I & D EXTREMITY Right 08/14/2014   Procedure: IRRIGATION AND DEBRIDEMENT EXTREMITY WITH MUSCLE BIOPSY;  Surgeon: Roseanne Kaufman, MD;  Location: [facility];  Service: Orthopedics;  Laterality: Right;   I & D EXTREMITY Right 08/20/2014   Procedure:  I AND D LEG / THIGH ABSCESS AND MANIPULATION OF RIGHT ELBOW.;  Surgeon: Renette Butters, MD;  Location: [facility];  Service: Orthopedics;  Laterality: Right;   I & D EXTREMITY Right 09/30/2014   Procedure: IRRIGATION AND DEBRIDEMENT RIGHT THIGH ABSCESS;  Surgeon: [name], MD;  Location: [facility];  Service: Orthopedics;  Laterality: Right;   I & D EXTREMITY Right 10/03/2014   Procedure: IRRIGATION AND DEBRIDEMENT RIGHT THIGH ABSCESS,WITH WOUND CLOSURE & MANIPULATION OF RIGHT KNEE;  Surgeon: Renette Butters, MD;  Location: [facility];  Service: Orthopedics;  Laterality: Right;   I & D EXTREMITY Left 09/19/2020   Procedure: IRRIGATION AND DEBRIDEMENT EXTREMITY;  Surgeon: Altamese Fairdale, MD;  Location: [facility];  Service: Orthopedics;  Laterality: Left;   I & D EXTREMITY Left 09/21/2020   Procedure: IRRIGATION AND DEBRIDEMENT EXTREMITY, partial excision bone 2nd metatarsal;  Surgeon: Altamese Grayslake, MD;  Location: [facility];  Service: Orthopedics;  Laterality: Left;   INCISION AND DRAINAGE ABSCESS Right 12/09/2014   Procedure: INCISION AND DRAINAGE ABSCESS;  Surgeon: Renette Butters, MD;  Location: [facility];  Service: Orthopedics;  Laterality: Right;   INSERTION OF DIALYSIS CATHETER N/A 09/01/2014   Procedure:  INSERTION OF DIALYSIS CATHETER IN RIGHT INTERNAL JUGUILAR;  Surgeon: Conrad [place], MD;  Location: [facility];  Service: Vascular;  Laterality: N/A;   KIDNEY TRANSPLANT  05/15/15   OPEN REDUCTION INTERNAL FIXATION (ORIF) FOOT LISFRANC FRACTURE Left 07/28/2020   Procedure: OPEN REDUCTION INTERNAL FIXATION (ORIF) FOOT LISFRANC FRACTURE;  Surgeon: Altamese Canastota, MD;  [facility];  Service: Orthopedics;  Laterality: Left;   ORIF TOE FRACTURE Left 07/28/2020   Procedure: OPEN REDUCTION INTERNAL FIXATION (ORIF) METATARSAL (TOE) FRACTURE;  Surgeon: Altamese Waitsburg, MD;  Location: [facility];  Service: Orthopedics;  Laterality: Left;   PARS PLANA VITRECTOMY  10/01/2011   Procedure: PARS PLANA VITRECTOMY WITH 25 GAUGE;  Surgeon: Hayden Pedro, MD;  Location: [facility];  Service: Ophthalmology;  Laterality: Right;  Repair Complex Traction Retinal Detachment   PERCUTANEOUS PINNING Left 07/28/2020   Procedure: PERCUTANEOUS PINNING  2ND AND 3RD METATARSALS;  Surgeon: Altamese , MD;  Location: [facility];  Service: Orthopedics;  Laterality: Left;   PILONIDAL CYST EXCISION     TRIGGER FINGER RELEASE Right 09/21/13   WISDOM TOOTH EXTRACTION     Current Outpatient Medications on File Prior to Visit  Medication Sig Dispense Refill   acetaminophen (TYLENOL) 500 MG tablet Take 2 tablets (1,000 mg total) by mouth every 8 (eight) hours. 60 tablet 0   aspirin EC 325 MG tablet Take 1 tablet (325 mg total) by mouth daily. 30 tablet 0   ceFAZolin (ANCEF) IVPB Inject 2 g into the vein every 8 (eight) hours. Indication:  Left ankle infection with hardware First Dose: Yes Last Day of Therapy:  11/02/20 Labs - Once weekly:  CBC/D and BMP, Labs - Every other week:  ESR and CRP Method of administration: IV Push Method of administration may be changed at the discretion of home infusion pharmacist based upon assessment of the patient and/or caregiver's ability to self-administer the medication ordered. 126 Units 0   gabapentin (NEURONTIN)  300 MG capsule Take 300 mg by mouth 3 (three) times daily.     lidocaine-prilocaine (EMLA) cream Apply 1 application topically as needed. Port access     medroxyPROGESTERone (DEPO-PROVERA) 150 MG/ML injection Inject 1 mL (150 mg total) into the muscle every 3 (three) months.     methocarbamol (ROBAXIN) 500 MG tablet Take 1-2 tablets (500-1,000 mg total) by mouth every 8 (eight) hours as needed for muscle spasms. 60 tablet 0   mycophenolate (MYFORTIC) 180 MG EC tablet Take 720 mg by mouth 2 (two) times daily.     ondansetron (ZOFRAN ODT) 4 MG disintegrating tablet Take 1 tablet (4 mg total) by mouth every 8 (eight) hours as needed for nausea or vomiting. 20 tablet 0   predniSONE (DELTASONE) 10 MG tablet Take 10 mg by mouth daily with breakfast.     tacrolimus (PROGRAF) 1 MG capsule Take 2 mg by mouth 2 (two) times daily.     traMADol (ULTRAM) 50 MG tablet Take 1-2 tablets (50-100 mg total) by mouth every 8 (eight) hours as needed for moderate pain or severe pain. 40 tablet 0   traZODone (DESYREL) 50 MG tablet Take 1 tablet (50 mg total) by mouth at bedtime as needed for sleep. (Patient taking differently: Take 50-100 mg by mouth at bedtime as needed for sleep.)     VITAMIN D PO Take 1 capsule by mouth daily.     No current facility-administered medications on file prior to visit.   Allergies  Allergen Reactions   Penicillins Hives and Swelling    Patient is Cephalosporin tolerant    Sulfa Antibiotics Hives   Social History   Socioeconomic History   Marital status: Married    Spouse name: Audelia Acton   Number of children: 0   Years of education: College   Highest education level: Not on file  Occupational History    Employer: [employer]  Tobacco Use   Smoking status: Never   Smokeless tobacco: Never  Vaping Use   Vaping Use: Never used  Substance and Sexual Activity   Alcohol use: No    Alcohol/week: 0.0 standard drinks   Drug use: No   Sexual activity: Yes    Birth control/protection:  Injection  Other Topics Concern   Not on file  Social History Narrative   Patient lives at home with family.   Caffeine Use: 1 soda daily   Social Determinants of Health   Financial Resource Strain: Low Risk    Difficulty of Paying Living Expenses: Not hard at all  Food Insecurity: No Food Insecurity   Worried About Charity fundraiser in the Last Year: Never true   Ran Out of Food in the Last Year: Never true  Transportation Needs: No Transportation Needs   Lack of Transportation (Medical): No   Lack of Transportation (Non-Medical): No  Physical Activity: Sufficiently Active   Days of Exercise per Week: 7 days   Minutes of Exercise per Session: 90 min  Stress: Stress Concern Present   Feeling of Stress : To some extent  Social Connections: Engineer, building services of Communication with Friends and Family: More than three times a week   Frequency of Social Gatherings with Friends and Family: Twice a week   Attends Religious Services: More than 4 times per year   Active Member of [employer] or Organizations: Yes   Attends Music therapist: More than 4 times per year   Marital Status: Married  Human resources officer Violence: Not At Risk   Fear of Current or Ex-Partner: No   Emotionally Abused: No   Physically Abused: No   Sexually Abused: No   Family History  Problem Relation Age of Onset   Cancer Paternal Grandfather        prostate   Hyperlipidemia Paternal Grandfather    Stroke Paternal Grandfather      Vitals There were no vitals taken for this visit.   Examination  General - not in acute distress, comfortably sitting in chair HEENT - PEERLA, no pallor and no icterus Chest - b/l clear air entry, no additional sounds CVS- Normal s1s2, RRR Abdomen - Soft, Non tender , non distended Ext- no pedal edema Neuro: grossly normal Back - WNL Psych : calm and cooperative   Recent labs CBC Latest Ref Rng & Units 09/23/2020 07/28/2020 12/09/2014  WBC 4.0 -  10.5 K/uL 9.1 7.3 -  Hemoglobin 12.0 - 15.0 g/dL 8.5(L) 12.4 10.9(L)  Hematocrit 36.0 - 46.0 % 28.4(L) 40.2 32.0(L)  Platelets 150 - 400 K/uL 246 219 -   CMP Latest Ref Rng & Units 09/23/2020 09/22/2020 09/21/2020  Glucose 70 - 99 mg/dL 115(H) 133(H) 133(H)  BUN 6 - 20 mg/dL 22(H) 19 18  Creatinine 0.44 - 1.00 mg/dL 1.16(H) 1.34(H) 1.27(H)  Sodium 135 - 145 mmol/L 137 137 139  Potassium 3.5 - 5.1 mmol/L 4.9 5.2(H) 4.8  Chloride 98 - 111 mmol/L 114(H) 112(H) 114(H)  CO2 22 - 32 mmol/L 19(L) 21(L) 20(L)  Calcium 8.9 - 10.3 mg/dL 8.3(L) 8.6(L) 8.7(L)  Total Protein 6.5 - 8.1 g/dL 5.1(L) - -  Total Bilirubin 0.3 - 1.2 mg/dL 0.5 - -  Alkaline Phos 38 - 126 U/L 96 - -  AST 15 - 41 U/L 12(L) - -  ALT 0 - 44 U/L 8 - -    Pertinent Microbiology Results for orders placed or performed during the hospital encounter  of 09/19/20  SARS Coronavirus 2 by RT PCR (hospital order, performed in [facility] hospital lab) Nasopharyngeal Nasopharyngeal Swab     Status: None   Collection Time: 09/19/20  6:27 AM   Specimen: Nasopharyngeal Swab  Result Value Ref Range Status   SARS Coronavirus 2 NEGATIVE NEGATIVE Final    Comment: (NOTE) SARS-CoV-2 target nucleic acids are NOT DETECTED.  The SARS-CoV-2 RNA is generally detectable in upper and lower respiratory specimens during the acute phase of infection. The lowest concentration of SARS-CoV-2 viral copies this assay can detect is 250 copies / mL. A negative result does not preclude SARS-CoV-2 infection and should not be used as the sole basis for treatment or other patient management decisions.  A negative result may occur with improper specimen collection / handling, submission of specimen other than nasopharyngeal swab, presence of viral mutation(s) within the areas targeted by this assay, and inadequate number of viral copies (<250 copies / mL). A negative result must be combined with clinical observations, patient history, and epidemiological  information.  Fact Sheet for Patients:   StrictlyIdeas.no  Fact Sheet for Healthcare Providers: BankingDealers.co.za  This test is not yet approved or  cleared by the [place] FDA and has been authorized for detection and/or diagnosis of SARS-CoV-2 by FDA under an Emergency Use Authorization (EUA).  This EUA will remain in effect (meaning this test can be used) for the duration of the COVID-19 declaration under Section 564(b)(1) of the Act, 21 U.S.C. section 360bbb-3(b)(1), unless the authorization is terminated or revoked sooner.  Performed at [facility], [place] [address], [place], [place] [address]   Aerobic/Anaerobic Culture w Gram Stain (surgical/deep wound)     Status: None   Collection Time: 09/19/20  8:49 AM   Specimen: Wound; Tissue  Result Value Ref Range Status   Specimen Description WOUND  Final   Special Requests LEFT FOOD WOUND SPEC A  Final   Gram Stain NO WBC SEEN NO ORGANISMS SEEN   Final   Culture   Final    RARE STAPHYLOCOCCUS AUREUS NO ANAEROBES ISOLATED Performed at [facility], [address], [place], [place] [address]    Report Status 09/24/2020 FINAL  Final   Organism ID, Bacteria STAPHYLOCOCCUS AUREUS  Final      Susceptibility   Staphylococcus aureus - MIC*    CIPROFLOXACIN <=0.5 SENSITIVE Sensitive     ERYTHROMYCIN >=8 RESISTANT Resistant     GENTAMICIN <=0.5 SENSITIVE Sensitive     OXACILLIN <=0.25 SENSITIVE Sensitive     TETRACYCLINE <=1 SENSITIVE Sensitive     VANCOMYCIN 1 SENSITIVE Sensitive     TRIMETH/SULFA <=10 SENSITIVE Sensitive     CLINDAMYCIN RESISTANT Resistant     RIFAMPIN <=0.5 SENSITIVE Sensitive     Inducible Clindamycin POSITIVE Resistant     * RARE STAPHYLOCOCCUS AUREUS  Aerobic/Anaerobic Culture w Gram Stain (surgical/deep wound)     Status: None   Collection Time: 09/19/20  8:51 AM   Specimen: Wound; Tissue  Result Value Ref Range Status   Specimen  Description WOUND  Final   Special Requests LEFT FOOT WOUND SPEC B  Final   Gram Stain   Final    MODERATE WBC PRESENT, PREDOMINANTLY PMN FEW GRAM POSITIVE COCCI IN PAIRS IN CLUSTERS    Culture   Final    ABUNDANT STAPHYLOCOCCUS AUREUS NO ANAEROBES ISOLATED Performed at [facility], [place] [address], [place], [place] [address]    Report Status 09/24/2020 FINAL  Final   Organism ID, Bacteria STAPHYLOCOCCUS AUREUS  Final      Susceptibility   Staphylococcus aureus - MIC*    CIPROFLOXACIN <=0.5 SENSITIVE Sensitive     ERYTHROMYCIN >=8 RESISTANT Resistant     GENTAMICIN <=0.5 SENSITIVE Sensitive     OXACILLIN <=0.25 SENSITIVE Sensitive     TETRACYCLINE <=1 SENSITIVE Sensitive     VANCOMYCIN <=0.5 SENSITIVE Sensitive     TRIMETH/SULFA <=10 SENSITIVE Sensitive     CLINDAMYCIN RESISTANT Resistant     RIFAMPIN <=0.5 SENSITIVE Sensitive     Inducible Clindamycin POSITIVE Resistant     * ABUNDANT STAPHYLOCOCCUS AUREUS      Pertinent Imaging All pertinent labs/Imagings/notes reviewed. All pertinent plain films and CT images have been personally visualized and interpreted; radiology reports have been reviewed. Decision making incorporated into the Impression / Recommendations.  I have spent a total of 60 minutes of face-to-face and non-face-to-face time, excluding clinical staff time, preparing to see patient, ordering tests and/or medications, and provide counseling the patient    Electronically signed by:  Rosiland Oz, MD Infectious Disease Physician [facility] [address] [place],  [address] Phone: [phone]  Fax: [phone]

## 2020-10-05 ENCOUNTER — Inpatient Hospital Stay: Payer: BC Managed Care – PPO | Admitting: Infectious Diseases

## 2020-10-05 DIAGNOSIS — L02612 Cutaneous abscess of left foot: Secondary | ICD-10-CM | POA: Diagnosis not present

## 2020-10-05 DIAGNOSIS — Z949 Transplanted organ and tissue status, unspecified: Secondary | ICD-10-CM | POA: Diagnosis not present

## 2020-10-05 DIAGNOSIS — M86172 Other acute osteomyelitis, left ankle and foot: Secondary | ICD-10-CM | POA: Diagnosis not present

## 2020-10-06 ENCOUNTER — Telehealth: Payer: Self-pay | Admitting: Adult Health

## 2020-10-06 DIAGNOSIS — Z949 Transplanted organ and tissue status, unspecified: Secondary | ICD-10-CM | POA: Diagnosis not present

## 2020-10-06 DIAGNOSIS — M86172 Other acute osteomyelitis, left ankle and foot: Secondary | ICD-10-CM | POA: Diagnosis not present

## 2020-10-06 DIAGNOSIS — L02612 Cutaneous abscess of left foot: Secondary | ICD-10-CM | POA: Diagnosis not present

## 2020-10-06 MED ORDER — LORAZEPAM 0.5 MG PO TABS: 0.5000 mg | ORAL_TABLET | Freq: Three times a day (TID) | ORAL | 0 refills | Status: DC

## 2020-10-06 NOTE — Telephone Encounter (Signed)
Pt requesting refill on Lorazepam [facility] has ordered for her in the past  She's been doing well but is having a hard time - broke her foot 3 months ago & it got infected & is now getting IV antibiotics at home Having a hard time coping with being unable to do the things she needs to be doing, tearful during conversation   Please advise & notify pt     [facility]

## 2020-10-06 NOTE — Telephone Encounter (Signed)
Will rx ativan

## 2020-10-07 DIAGNOSIS — L02612 Cutaneous abscess of left foot: Secondary | ICD-10-CM | POA: Diagnosis not present

## 2020-10-07 DIAGNOSIS — M86172 Other acute osteomyelitis, left ankle and foot: Secondary | ICD-10-CM | POA: Diagnosis not present

## 2020-10-07 DIAGNOSIS — Z949 Transplanted organ and tissue status, unspecified: Secondary | ICD-10-CM | POA: Diagnosis not present

## 2020-10-08 DIAGNOSIS — Z949 Transplanted organ and tissue status, unspecified: Secondary | ICD-10-CM | POA: Diagnosis not present

## 2020-10-08 DIAGNOSIS — M86172 Other acute osteomyelitis, left ankle and foot: Secondary | ICD-10-CM | POA: Diagnosis not present

## 2020-10-08 DIAGNOSIS — L02612 Cutaneous abscess of left foot: Secondary | ICD-10-CM | POA: Diagnosis not present

## 2020-10-09 DIAGNOSIS — L02612 Cutaneous abscess of left foot: Secondary | ICD-10-CM | POA: Diagnosis not present

## 2020-10-09 DIAGNOSIS — M86172 Other acute osteomyelitis, left ankle and foot: Secondary | ICD-10-CM | POA: Diagnosis not present

## 2020-10-09 DIAGNOSIS — Z949 Transplanted organ and tissue status, unspecified: Secondary | ICD-10-CM | POA: Diagnosis not present

## 2020-10-10 DIAGNOSIS — M86172 Other acute osteomyelitis, left ankle and foot: Secondary | ICD-10-CM | POA: Diagnosis not present

## 2020-10-10 DIAGNOSIS — L02612 Cutaneous abscess of left foot: Secondary | ICD-10-CM | POA: Diagnosis not present

## 2020-10-10 DIAGNOSIS — Z949 Transplanted organ and tissue status, unspecified: Secondary | ICD-10-CM | POA: Diagnosis not present

## 2020-10-11 ENCOUNTER — Telehealth: Payer: Self-pay

## 2020-10-11 DIAGNOSIS — Z949 Transplanted organ and tissue status, unspecified: Secondary | ICD-10-CM | POA: Diagnosis not present

## 2020-10-11 DIAGNOSIS — M86172 Other acute osteomyelitis, left ankle and foot: Secondary | ICD-10-CM | POA: Diagnosis not present

## 2020-10-11 DIAGNOSIS — L02612 Cutaneous abscess of left foot: Secondary | ICD-10-CM | POA: Diagnosis not present

## 2020-10-11 NOTE — Telephone Encounter (Signed)
Patient called requesting lab results from home health and explanation of results. Will route to provider.    Beryle Flock, RN

## 2020-10-12 DIAGNOSIS — Z949 Transplanted organ and tissue status, unspecified: Secondary | ICD-10-CM | POA: Diagnosis not present

## 2020-10-12 DIAGNOSIS — M86172 Other acute osteomyelitis, left ankle and foot: Secondary | ICD-10-CM | POA: Diagnosis not present

## 2020-10-12 DIAGNOSIS — L02612 Cutaneous abscess of left foot: Secondary | ICD-10-CM | POA: Diagnosis not present

## 2020-10-12 NOTE — Telephone Encounter (Signed)
Relayed Dr. Levonne Spiller message to patient about lab work. Patient requesting copies for surgeon. RN stated their office should be able to view them on [facility] or they can contact our office for copies. Patient reports wound is still open and draining.   Telford Archambeau Lorita Officer, RN

## 2020-10-13 DIAGNOSIS — Z949 Transplanted organ and tissue status, unspecified: Secondary | ICD-10-CM | POA: Diagnosis not present

## 2020-10-13 DIAGNOSIS — L02612 Cutaneous abscess of left foot: Secondary | ICD-10-CM | POA: Diagnosis not present

## 2020-10-13 DIAGNOSIS — M86172 Other acute osteomyelitis, left ankle and foot: Secondary | ICD-10-CM | POA: Diagnosis not present

## 2020-10-14 DIAGNOSIS — L02612 Cutaneous abscess of left foot: Secondary | ICD-10-CM | POA: Diagnosis not present

## 2020-10-14 DIAGNOSIS — Z949 Transplanted organ and tissue status, unspecified: Secondary | ICD-10-CM | POA: Diagnosis not present

## 2020-10-14 DIAGNOSIS — M86172 Other acute osteomyelitis, left ankle and foot: Secondary | ICD-10-CM | POA: Diagnosis not present

## 2020-10-15 DIAGNOSIS — Z949 Transplanted organ and tissue status, unspecified: Secondary | ICD-10-CM | POA: Diagnosis not present

## 2020-10-15 DIAGNOSIS — M86172 Other acute osteomyelitis, left ankle and foot: Secondary | ICD-10-CM | POA: Diagnosis not present

## 2020-10-15 DIAGNOSIS — L02612 Cutaneous abscess of left foot: Secondary | ICD-10-CM | POA: Diagnosis not present

## 2020-10-16 DIAGNOSIS — L02612 Cutaneous abscess of left foot: Secondary | ICD-10-CM | POA: Diagnosis not present

## 2020-10-16 DIAGNOSIS — M86172 Other acute osteomyelitis, left ankle and foot: Secondary | ICD-10-CM | POA: Diagnosis not present

## 2020-10-16 DIAGNOSIS — Z949 Transplanted organ and tissue status, unspecified: Secondary | ICD-10-CM | POA: Diagnosis not present

## 2020-10-16 NOTE — Op Note (Signed)
NAME: Tracy [name], Tracy [name] MEDICAL RECORD NO: [id number] ACCOUNT NO: [id number] DATE OF BIRTH: 1985-12-14 FACILITY: [facility] LOCATION: [facility] PHYSICIAN: Astrid Divine. Marcelino Scot, MD  Operative Report   DATE OF PROCEDURE: 09/21/2020  PREOPERATIVE DIAGNOSIS:  Left foot abscess.  POSTOPERATIVE DIAGNOSES: 1.  Left foot osteomyelitis with infected vs nonunited third metatarsal head. 2.  Desiccated short extensor tendon.  PROCEDURES: 1.  Repeat irrigation and debridement of the left foot including excision of extensor tendon. 2.  Partial excision of third metatarsal head.  SURGEON:  Astrid Divine. Marcelino Scot, MD  ASSISTANT:  None.  ANESTHESIA:  General.  COMPLICATIONS:  None.  TOURNIQUET:  None.  SPECIMENS:  None.  DISPOSITION:  PACU.  CONDITION:  Stable.  BRIEF SUMMARY AND INDICATION FOR PROCEDURE:  The patient is a very pleasant 35 year old female who is status post recent debridement for foot abscess, which occurred near the conclusion of her treatment for a Lisfranc metatarsal fracture dislocation.   The patient has a complicated past medical history, which includes organ transplantation with chronic immunosuppressants as well as prior severe infections, one of which resulted in sepsis and organ failure.  At this time, culture results  suggest Staph aureus and final sensitivities are pending.  I discussed with the patient the need for return to the OR today and the possibility of further serial procedures with which she is well aware. She does provide consent to proceed.  BRIEF SUMMARY OF PROCEDURE:  The patient was taken to the operating room where general anesthesia was induced.  The left lower extremity was prepped and draped in the usual sterile fashion with chlorhexidine wash and Betadine scrub and paint.  No  Betadine was put directly in the wound bed to avoid cytotoxicity.  A timeout was held.  A thorough evaluation and additional debridement was performed excising some small necrotic wound edge,  necrotic soft tissue underneath, but there was no purulence.   Unfortunately, there was desiccation of the tendon, which extended across a large area of no healthy muscle and was deemed a high risk for persistence and propagation of infection of the tendon sheath.  Consequently, this was the third toe and should  have commensurate motion with the adjacent toes.  Decision was made to proceed with excision.  This did enable additional visualization of the metatarsal head.  The dorsal component of which was not yet fully united and was mobile.  This was therefore  excised.  Copious irrigation was performed of the entire wound bed and then a very loose closure performed with vertical mattress nylon to evert the wound edges and a wick placed deep within the wound to facilitate continued evacuation and drainage.   Sterile gently compressive dressing and splint was applied.  The patient was taken to the PACU in stable condition.  PROGNOSIS:  The patient remains high risk for persistent infection and the possibility of limb loss.  I have asked my colleague, Dr. Sharol Given, because of his expertise and experience in this area to provide further consultation and  recommendations.  She may require return to the OR during this hospitalization versus wound care, and of course, antibiotics will be continued and adjusted according to sensitivities.   [facility] D: 10/16/2020 11:34:09 am T: 10/16/2020 3:46:00 pm  JOB: [id number]

## 2020-10-17 DIAGNOSIS — L02612 Cutaneous abscess of left foot: Secondary | ICD-10-CM | POA: Diagnosis not present

## 2020-10-17 DIAGNOSIS — Z949 Transplanted organ and tissue status, unspecified: Secondary | ICD-10-CM | POA: Diagnosis not present

## 2020-10-17 DIAGNOSIS — M86172 Other acute osteomyelitis, left ankle and foot: Secondary | ICD-10-CM | POA: Diagnosis not present

## 2020-10-18 DIAGNOSIS — L02612 Cutaneous abscess of left foot: Secondary | ICD-10-CM | POA: Diagnosis not present

## 2020-10-18 DIAGNOSIS — S92342D Displaced fracture of fourth metatarsal bone, left foot, subsequent encounter for fracture with routine healing: Secondary | ICD-10-CM | POA: Diagnosis not present

## 2020-10-18 DIAGNOSIS — S92332D Displaced fracture of third metatarsal bone, left foot, subsequent encounter for fracture with routine healing: Secondary | ICD-10-CM | POA: Diagnosis not present

## 2020-10-18 DIAGNOSIS — S92312D Displaced fracture of first metatarsal bone, left foot, subsequent encounter for fracture with routine healing: Secondary | ICD-10-CM | POA: Diagnosis not present

## 2020-10-18 DIAGNOSIS — Z949 Transplanted organ and tissue status, unspecified: Secondary | ICD-10-CM | POA: Diagnosis not present

## 2020-10-18 DIAGNOSIS — S92352D Displaced fracture of fifth metatarsal bone, left foot, subsequent encounter for fracture with routine healing: Secondary | ICD-10-CM | POA: Diagnosis not present

## 2020-10-18 DIAGNOSIS — M86172 Other acute osteomyelitis, left ankle and foot: Secondary | ICD-10-CM | POA: Diagnosis not present

## 2020-10-19 DIAGNOSIS — L02612 Cutaneous abscess of left foot: Secondary | ICD-10-CM | POA: Diagnosis not present

## 2020-10-19 DIAGNOSIS — M79672 Pain in left foot: Secondary | ICD-10-CM | POA: Diagnosis not present

## 2020-10-19 DIAGNOSIS — Z9483 Pancreas transplant status: Secondary | ICD-10-CM | POA: Diagnosis not present

## 2020-10-19 DIAGNOSIS — Z940 Kidney transplant status: Secondary | ICD-10-CM | POA: Diagnosis not present

## 2020-10-19 DIAGNOSIS — M86172 Other acute osteomyelitis, left ankle and foot: Secondary | ICD-10-CM | POA: Diagnosis not present

## 2020-10-19 DIAGNOSIS — S92302G Fracture of unspecified metatarsal bone(s), left foot, subsequent encounter for fracture with delayed healing: Secondary | ICD-10-CM | POA: Diagnosis not present

## 2020-10-19 DIAGNOSIS — X58XXXD Exposure to other specified factors, subsequent encounter: Secondary | ICD-10-CM | POA: Diagnosis not present

## 2020-10-19 DIAGNOSIS — Z949 Transplanted organ and tissue status, unspecified: Secondary | ICD-10-CM | POA: Diagnosis not present

## 2020-10-19 DIAGNOSIS — S91302A Unspecified open wound, left foot, initial encounter: Secondary | ICD-10-CM | POA: Diagnosis not present

## 2020-10-19 DIAGNOSIS — M7989 Other specified soft tissue disorders: Secondary | ICD-10-CM | POA: Diagnosis not present

## 2020-10-20 DIAGNOSIS — M86172 Other acute osteomyelitis, left ankle and foot: Secondary | ICD-10-CM | POA: Diagnosis not present

## 2020-10-20 DIAGNOSIS — L02612 Cutaneous abscess of left foot: Secondary | ICD-10-CM | POA: Diagnosis not present

## 2020-10-20 DIAGNOSIS — Z949 Transplanted organ and tissue status, unspecified: Secondary | ICD-10-CM | POA: Diagnosis not present

## 2020-10-21 DIAGNOSIS — Z949 Transplanted organ and tissue status, unspecified: Secondary | ICD-10-CM | POA: Diagnosis not present

## 2020-10-21 DIAGNOSIS — M86172 Other acute osteomyelitis, left ankle and foot: Secondary | ICD-10-CM | POA: Diagnosis not present

## 2020-10-21 DIAGNOSIS — L02612 Cutaneous abscess of left foot: Secondary | ICD-10-CM | POA: Diagnosis not present

## 2020-10-23 DIAGNOSIS — L02612 Cutaneous abscess of left foot: Secondary | ICD-10-CM | POA: Diagnosis not present

## 2020-10-23 DIAGNOSIS — M86172 Other acute osteomyelitis, left ankle and foot: Secondary | ICD-10-CM | POA: Diagnosis not present

## 2020-10-23 DIAGNOSIS — Z949 Transplanted organ and tissue status, unspecified: Secondary | ICD-10-CM | POA: Diagnosis not present

## 2020-10-24 ENCOUNTER — Other Ambulatory Visit: Payer: Self-pay

## 2020-10-24 ENCOUNTER — Encounter: Payer: Self-pay | Admitting: Orthopedic Surgery

## 2020-10-24 ENCOUNTER — Other Ambulatory Visit: Payer: Self-pay | Admitting: Physician Assistant

## 2020-10-24 ENCOUNTER — Encounter (HOSPITAL_COMMUNITY): Payer: Self-pay | Admitting: Orthopedic Surgery

## 2020-10-24 ENCOUNTER — Ambulatory Visit (INDEPENDENT_AMBULATORY_CARE_PROVIDER_SITE_OTHER): Payer: BC Managed Care – PPO | Admitting: Orthopedic Surgery

## 2020-10-24 DIAGNOSIS — L97524 Non-pressure chronic ulcer of other part of left foot with necrosis of bone: Secondary | ICD-10-CM | POA: Diagnosis not present

## 2020-10-24 DIAGNOSIS — M86272 Subacute osteomyelitis, left ankle and foot: Secondary | ICD-10-CM

## 2020-10-24 DIAGNOSIS — M86172 Other acute osteomyelitis, left ankle and foot: Secondary | ICD-10-CM | POA: Diagnosis not present

## 2020-10-24 DIAGNOSIS — L02612 Cutaneous abscess of left foot: Secondary | ICD-10-CM | POA: Diagnosis not present

## 2020-10-24 DIAGNOSIS — Z949 Transplanted organ and tissue status, unspecified: Secondary | ICD-10-CM | POA: Diagnosis not present

## 2020-10-24 NOTE — Progress Notes (Signed)
Mrs. Schor denies chest pain   or shortness of breath. Patient denies having any s/s of Covid in her household.  Patient denies any known exposure to Covid.   I instructed patient to shower with antibiotic soap, if it is available.  Dry off with a clean towel. Do not put lotion, powder, cologne or deodorant or makeup.No jewelry or piercings. Men may shave their face and neck. Woman should not shave. No nail polish, artificial or acrylic nails. Wear clean clothes, brush your teeth. Glasses, contact lens,dentures or partials may not be worn in the OR. If you need to wear them, please bring a case for glasses, do not wear contacts or bring a case, the hospital does not have contact cases, dentures or partials will have to be removed , make sure they are clean, we will provide a denture cup to put them in. You will need some one to drive you home and a responsible person over the age of 38 to stay with you for the first 24 hours after surgery.

## 2020-10-24 NOTE — Progress Notes (Signed)
Office Visit Note   Patient: Tracy [name]           Date of Birth: [birth year]           MRN: [id number] Visit Date: 10/24/2020              Requested by: No referring provider defined for this encounter. PCP: Patient, No Pcp Per (Inactive)  Chief Complaint  Patient presents with   Left Foot - Open Wound    07/28/20 ORIF MT fx with Dr. Marcelino Scot. Slow to heal wound had I&D 09/21/20 with partial bone excision       HPI: Patient is a 35 year old woman who is seen in referral from Dr. Marcelino Scot.  Patient was involved in a horse accident where she was thrown from her horse on June 3.  Patient underwent open reduction internal fixation as well as several irrigation debridements.  Patient still has a persistent open wound with a deep tunneling area on the dorsum of her foot.  She has been on IV antibiotics has undergone excellent wound care with compression and packing the wound open and still has drainage without resolution of the ulcer.  Patient states she is also used some electrical stimulation.  Of note patient is a kidney pancreas transplant secondary to type 1 diabetes.  Patient's transplant was 2017 and she is on immunosuppressive therapy.  Assessment & Plan: Visit Diagnoses:  1. Subacute osteomyelitis of left foot ([place])   2. Non-pressure chronic ulcer of other part of left foot with necrosis of bone ([place])     Plan: With the exposed bone and deep venous ulcer with established biofilm recommend proceeding with excision of the infected soft tissue and bone sending this for cultures and placement of a cleanse choice wound VAC sponge and start therapy with a nitroglycerin patch and Trental to help improve the microcirculation.  We will place her in a postoperative shoe today.  Follow-Up Instructions: Return in about 1 week (around 10/31/2020).   Ortho Exam  Patient is alert, oriented, no adenopathy, well-dressed, normal affect, normal respiratory effort. Examination patient has swelling in  the left foot and ankle.  She does have some hair growth on the foot and ankle.  I cannot palpate a pulse secondary to swelling the Doppler was used and she has a strong biphasic dorsalis pedis and posterior tibial pulse.  The wound is 5 mm in diameter and using a Q-tip there is a large cavernous area that is about 2 cm in diameter with exposed metatarsal.  Patient's most recent cultures showed staph aureus.  Her most recent hemoglobin A1c is 6.0 with a sed rate of 93 and a C-reactive protein of 25.7.  Albumin 2.2.    Imaging:  No results found.   Labs: Lab Results  Component Value Date   HGBA1C 6.0 (H) 09/19/2020   HGBA1C 8.9 (H) 08/10/2014   HGBA1C 16.8 (H) 01/20/2013   ESRSEDRATE 93 (H) 09/19/2020   ESRSEDRATE 12 11/09/2014   ESRSEDRATE 112 (H) 08/09/2014   CRP 25.7 (H) 09/19/2020   CRP <0.5 11/09/2014   CRP 28.5 (H) 08/09/2014   REPTSTATUS 09/24/2020 FINAL 09/19/2020   GRAMSTAIN  09/19/2020    MODERATE WBC PRESENT, PREDOMINANTLY PMN FEW GRAM POSITIVE COCCI IN PAIRS IN CLUSTERS    CULT  09/19/2020    ABUNDANT STAPHYLOCOCCUS AUREUS NO ANAEROBES ISOLATED Performed at [facility], [place] [address], [place], [place] [address]    [place] 09/19/2020     Lab  Results  Component Value Date   ALBUMIN 2.2 (L) 09/23/2020   ALBUMIN 2.3 (L) 09/20/2020   ALBUMIN 3.2 (L) 07/28/2020    Lab Results  Component Value Date   MG 2.0 08/09/2014   MG 2.3 01/25/2014   MG 1.6 01/22/2014   No results found for: VD25OH  No results found for: PREALBUMIN CBC EXTENDED Latest Ref Rng & Units 09/23/2020 07/28/2020 12/09/2014  WBC 4.0 - 10.5 K/uL 9.1 7.3 -  RBC 3.87 - 5.11 MIL/uL 3.16(L) 4.40 -  HGB 12.0 - 15.0 g/dL 8.5(L) 12.4 10.9(L)  HCT 36.0 - 46.0 % 28.4(L) 40.2 32.0(L)  PLT 150 - 400 K/uL 246 219 -  NEUTROABS 1.7 - 7.7 K/uL - 5.5 -  LYMPHSABS 0.7 - 4.0 K/uL - 1.1 -     There is no height or weight on file to calculate BMI.  Orders:  No orders of the  defined types were placed in this encounter.  No orders of the defined types were placed in this encounter.    Procedures: No procedures performed  Clinical Data: No additional findings.  ROS:  All other systems negative, except as noted in the HPI. Review of Systems  Objective: Vital Signs: There were no vitals taken for this visit.  Specialty Comments:  No specialty comments available.  PMFS History: Patient Active Problem List   Diagnosis Date Noted   Hardware complicating wound infection ([place]) 10/04/2020   Medication monitoring encounter 10/04/2020   Abscess of left foot 09/20/2020   Osteomyelitis of left foot ([place]) 09/20/2020   Cellulitis of left foot 09/19/2020   ASCUS with positive high risk human papillomavirus of vagina 05/01/2020   Abnormal uterine bleeding (AUB) 04/19/2020   History of abnormal cervical Pap smear 10/06/2017   Encounter for gynecological examination with Papanicolaou smear of cervix 10/06/2017   MDD (major depressive disorder), recurrent episode, moderate ([place]) 03/19/2017   Atypical squamous cell changes of undetermined significance (ASCUS) on vaginal cytology with positive high risk human papilloma virus (HPV) 09/05/2016   Encounter for surveillance of injectable contraceptive 08/30/2016   Urinary tract infection with hematuria 06/05/2016   Bad odor of urine 06/05/2016   Irregular intermenstrual bleeding 01/18/2016   Anxiety 01/18/2016   History of organ transplantation 08/18/2015   Abscess of calf 12/21/2014   Escherichia coli (E. 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, not stated as uncontrolled 10/16/2012   Hypothyroidism 10/15/2012   Proliferative diabetic retinopathy ([place]) 09/27/2011   Past Medical History:  Diagnosis Date   Abscess of left foot 09/20/2020   Abscess of right thigh    Anemia    presently on iron supplement   Anxiety    ASCUS with positive high risk human papillomavirus of vagina 05/01/2020   04/2020 ASCUS +HPV 16 and other will get Colpo    Atypical squamous cell changes of undetermined significance (ASCUS) on vaginal cytology with positive high risk human papilloma virus (HPV) 09/05/2016   Get colpo   Chronic kidney disease    on dialysis T, Th, Sat   CMV (cytomegalovirus infection) ([place])    Depression    Detached retina  Diabetes mellitus without complication ([place])    History of organ transplantation 08/18/2015   HSV-1 (herpes simplex virus 1) infection    HSV-2 (herpes simplex virus 2) infection    Hypertension    Hypothyroidism    Irregular periods 07/05/2014   Neuropathy    in feet   Osteomyelitis of left foot ([place]) 09/20/2020   Seizures ([place]) 01/2014   ? due to blood sugar   Thyroid enlargement    "not on medication at this time"   Urinary tract infection    hx of   Vaginal discharge 07/05/2014   Vaginal Pap smear, abnormal    Yeast infection    took diflucan saturday     Family History  Problem Relation Age of Onset   Cancer Paternal Grandfather        prostate   Hyperlipidemia Paternal Grandfather    Stroke Paternal Grandfather     Past Surgical History:  Procedure Laterality Date   APPLICATION OF A-CELL OF EXTREMITY Right 10/03/2014   Procedure: APPLICATION OF A-CELL OF RIGHT UPPER THIGH WOUND;  Surgeon: Renette Butters, MD;  Location: [place];  Service: Orthopedics;  Laterality: Right;   APPLICATION OF WOUND VAC Right 10/03/2014   Procedure: APPLICATION OF WOUND VAC RIGHT THIGH;  Surgeon: Renette Butters, MD;  Location: [place];  Service: Orthopedics;  Laterality: Right;   AV FISTULA  PLACEMENT Left 09/01/2014   Procedure: BRACHIOCEPHALIC ARTERIOVENOUS (AV) FISTULA CREATION;  Surgeon: Conrad Westgate, MD;  Location: [facility] [facility];  Service: Vascular;  Laterality: Left;   CHOLECYSTECTOMY     COMBINED KIDNEY-PANCREAS TRANSPLANT  05/15/15   I & D EXTREMITY Right 08/14/2014   Procedure: IRRIGATION AND DEBRIDEMENT EXTREMITY WITH MUSCLE BIOPSY;  Surgeon: Roseanne Kaufman, MD;  Location: [facility] [place];  Service: Orthopedics;  Laterality: Right;   I & D EXTREMITY Right 08/20/2014   Procedure:  I AND D LEG / THIGH ABSCESS AND MANIPULATION OF RIGHT ELBOW.;  Surgeon: Renette Butters, MD;  Location: [facility];  Service: Orthopedics;  Laterality: Right;   I & D EXTREMITY Right 09/30/2014   Procedure: IRRIGATION AND DEBRIDEMENT RIGHT THIGH ABSCESS;  Surgeon: [name], MD;  Location: [place];  Service: Orthopedics;  Laterality: Right;   I & D EXTREMITY Right 10/03/2014   Procedure: IRRIGATION AND DEBRIDEMENT RIGHT THIGH ABSCESS,WITH WOUND CLOSURE & MANIPULATION OF RIGHT KNEE;  Surgeon: Renette Butters, MD;  Location: [place];  Service: Orthopedics;  Laterality: Right;   I & D EXTREMITY Left 09/19/2020   Procedure: IRRIGATION AND DEBRIDEMENT EXTREMITY;  Surgeon: Altamese [place], MD;  Location: [place];  Service: Orthopedics;  Laterality: Left;   I & D EXTREMITY Left 09/21/2020   Procedure: IRRIGATION AND DEBRIDEMENT EXTREMITY, partial excision bone 2nd metatarsal;  Surgeon: Altamese [place], MD;  Location: [facility];  Service: Orthopedics;  Laterality: Left;   INCISION AND DRAINAGE ABSCESS Right 12/09/2014   Procedure: INCISION AND DRAINAGE ABSCESS;  Surgeon: Renette Butters, MD;  Location: [place];  Service: Orthopedics;  Laterality: Right;   INSERTION OF DIALYSIS CATHETER N/A 09/01/2014   Procedure: INSERTION OF DIALYSIS CATHETER IN RIGHT INTERNAL JUGUILAR;  Surgeon: Conrad N[place]sack, MD;  Location: [place];  Service: Vascular;  Laterality: N/A;   KIDNEY TRANSPLANT  05/15/15   OPEN REDUCTION INTERNAL FIXATION (ORIF) FOOT LISFRANC FRACTURE  Left 07/28/2020   Procedure: OPEN REDUCTION INTERNAL FIXATION (ORIF) FOOT LISFRANC FRACTURE;  Surgeon: Altamese [place], MD;  Location: [place];  Service: Orthopedics;  Laterality: Left;  ORIF TOE FRACTURE Left 07/28/2020   Procedure: OPEN REDUCTION INTERNAL FIXATION (ORIF) METATARSAL (TOE) FRACTURE;  Surgeon: Altamese [place], MD;  Location: [place];  Service: Orthopedics;  Laterality: Left;   PARS PLANA VITRECTOMY  10/01/2011   Procedure: PARS PLANA VITRECTOMY WITH 25 GAUGE;  Surgeon: Hayden Pedro, MD;  Location: [facility];  Service: Ophthalmology;  Laterality: Right;  Repair Complex Traction Retinal Detachment   PERCUTANEOUS PINNING Left 07/28/2020   Procedure: PERCUTANEOUS PINNING  2ND AND 3RD METATARSALS;  Surgeon: Altamese [place], MD;  Location: [place];  Service: Orthopedics;  Laterality: Left;   PILONIDAL CYST EXCISION     TRIGGER FINGER RELEASE Right 09/21/13   WISDOM TOOTH EXTRACTION     Social History   Occupational History    Employer: [employer]  Tobacco Use   Smoking status: Never   Smokeless tobacco: Never  Vaping Use   Vaping Use: Never used  Substance and Sexual Activity   Alcohol use: No    Alcohol/week: 0.0 standard drinks   Drug use: No   Sexual activity: Yes    Birth control/protection: Injection

## 2020-10-25 ENCOUNTER — Encounter (HOSPITAL_COMMUNITY)
Admission: RE | Disposition: A | Discharge status: Home / Self Care | Payer: Self-pay | Source: Home / Self Care | Attending: Orthopedic Surgery

## 2020-10-25 ENCOUNTER — Ambulatory Visit (HOSPITAL_COMMUNITY): Payer: BC Managed Care – PPO | Admitting: Certified Registered"

## 2020-10-25 ENCOUNTER — Encounter (HOSPITAL_COMMUNITY): Payer: Self-pay | Admitting: Orthopedic Surgery

## 2020-10-25 ENCOUNTER — Ambulatory Visit (HOSPITAL_COMMUNITY)
Admission: RE | Admit: 2020-10-25 | Discharge: 2020-10-25 | Disposition: A | Discharge status: Home / Self Care | Payer: BC Managed Care – PPO | Attending: Orthopedic Surgery | Admitting: Orthopedic Surgery

## 2020-10-25 DIAGNOSIS — Z881 Allergy status to other antibiotic agents status: Secondary | ICD-10-CM | POA: Insufficient documentation

## 2020-10-25 DIAGNOSIS — Z940 Kidney transplant status: Secondary | ICD-10-CM | POA: Insufficient documentation

## 2020-10-25 DIAGNOSIS — E10621 Type 1 diabetes mellitus with foot ulcer: Secondary | ICD-10-CM | POA: Insufficient documentation

## 2020-10-25 DIAGNOSIS — E1069 Type 1 diabetes mellitus with other specified complication: Secondary | ICD-10-CM | POA: Diagnosis not present

## 2020-10-25 DIAGNOSIS — L02612 Cutaneous abscess of left foot: Secondary | ICD-10-CM | POA: Diagnosis not present

## 2020-10-25 DIAGNOSIS — Z949 Transplanted organ and tissue status, unspecified: Secondary | ICD-10-CM | POA: Diagnosis not present

## 2020-10-25 DIAGNOSIS — L97529 Non-pressure chronic ulcer of other part of left foot with unspecified severity: Secondary | ICD-10-CM | POA: Diagnosis not present

## 2020-10-25 DIAGNOSIS — D84821 Immunodeficiency due to drugs: Secondary | ICD-10-CM | POA: Diagnosis not present

## 2020-10-25 DIAGNOSIS — Z882 Allergy status to sulfonamides status: Secondary | ICD-10-CM | POA: Diagnosis not present

## 2020-10-25 DIAGNOSIS — L97524 Non-pressure chronic ulcer of other part of left foot with necrosis of bone: Secondary | ICD-10-CM | POA: Insufficient documentation

## 2020-10-25 DIAGNOSIS — Z9483 Pancreas transplant status: Secondary | ICD-10-CM | POA: Insufficient documentation

## 2020-10-25 DIAGNOSIS — M86172 Other acute osteomyelitis, left ankle and foot: Secondary | ICD-10-CM

## 2020-10-25 DIAGNOSIS — Z880 Allergy status to penicillin: Secondary | ICD-10-CM | POA: Diagnosis not present

## 2020-10-25 DIAGNOSIS — M869 Osteomyelitis, unspecified: Secondary | ICD-10-CM | POA: Diagnosis not present

## 2020-10-25 HISTORY — DX: Other specified postprocedural states: Z98.890

## 2020-10-25 HISTORY — DX: Other complications of anesthesia, initial encounter: T88.59XA

## 2020-10-25 HISTORY — PX: I & D EXTREMITY: SHX5045

## 2020-10-25 HISTORY — DX: Other specified postprocedural states: R11.2

## 2020-10-25 HISTORY — PX: APPLICATION OF WOUND VAC: SHX5189

## 2020-10-25 LAB — CBC
HCT: 34.8 % — ABNORMAL LOW (ref 36.0–46.0)
Hemoglobin: 10.8 g/dL — ABNORMAL LOW (ref 12.0–15.0)
MCH: 28.2 pg (ref 26.0–34.0)
MCHC: 31 g/dL (ref 30.0–36.0)
MCV: 90.9 fL (ref 80.0–100.0)
Platelets: 190 10*3/uL (ref 150–400)
RBC: 3.83 MIL/uL — ABNORMAL LOW (ref 3.87–5.11)
RDW: 16.4 % — ABNORMAL HIGH (ref 11.5–15.5)
WBC: 8.1 10*3/uL (ref 4.0–10.5)
nRBC: 0 % (ref 0.0–0.2)

## 2020-10-25 LAB — BASIC METABOLIC PANEL
Anion gap: 6 (ref 5–15)
BUN: 18 mg/dL (ref 6–20)
CO2: 19 mmol/L — ABNORMAL LOW (ref 22–32)
Calcium: 9.2 mg/dL (ref 8.9–10.3)
Chloride: 114 mmol/L — ABNORMAL HIGH (ref 98–111)
Creatinine, Ser: 1.14 mg/dL — ABNORMAL HIGH (ref 0.44–1.00)
GFR, Estimated: >60 mL/min (ref >60)
Glucose, Bld: 104 mg/dL — ABNORMAL HIGH (ref 70–99)
Potassium: 3.6 mmol/L (ref 3.5–5.1)
Sodium: 139 mmol/L (ref 135–145)

## 2020-10-25 LAB — POCT PREGNANCY, URINE: Preg Test, Ur: NEGATIVE

## 2020-10-25 LAB — GLUCOSE, CAPILLARY: Glucose-Capillary: 92 mg/dL (ref 70–99)

## 2020-10-25 SURGERY — IRRIGATION AND DEBRIDEMENT EXTREMITY
Anesthesia: General | Laterality: Left

## 2020-10-25 MED ORDER — BUPIVACAINE HCL (PF) 0.5 % IJ SOLN
INTRAMUSCULAR | Status: DC | PRN
  Administered 2020-10-25: 30 mL via PERINEURAL

## 2020-10-25 MED ORDER — FENTANYL CITRATE (PF) 100 MCG/2ML IJ SOLN: 25.0000 ug | INTRAMUSCULAR | Status: DC | PRN

## 2020-10-25 MED ORDER — ONDANSETRON HCL 4 MG/2ML IJ SOLN: 4.0000 mg | Freq: Once | INTRAMUSCULAR | Status: DC | PRN

## 2020-10-25 MED ORDER — PROPOFOL 500 MG/50ML IV EMUL
INTRAVENOUS | Status: DC | PRN
  Administered 2020-10-25: 75 ug/kg/min via INTRAVENOUS

## 2020-10-25 MED ORDER — ONDANSETRON HCL 4 MG/2ML IJ SOLN
INTRAMUSCULAR | Status: DC | PRN
  Administered 2020-10-25: 4 mg via INTRAVENOUS

## 2020-10-25 MED ORDER — NITROGLYCERIN 0.2 MG/HR TD PT24: 0.2000 mg | MEDICATED_PATCH | Freq: Every day | TRANSDERMAL | 12 refills | Status: DC

## 2020-10-25 MED ORDER — FENTANYL CITRATE (PF) 100 MCG/2ML IJ SOLN
INTRAMUSCULAR | Status: DC | PRN
  Administered 2020-10-25: 100 ug via INTRAVENOUS

## 2020-10-25 MED ORDER — LIDOCAINE 2% (20 MG/ML) 5 ML SYRINGE
INTRAMUSCULAR | Status: AC
  Filled 2020-10-25: qty 5

## 2020-10-25 MED ORDER — PROPOFOL 1000 MG/100ML IV EMUL
INTRAVENOUS | Status: AC
  Filled 2020-10-25: qty 100

## 2020-10-25 MED ORDER — FENTANYL CITRATE (PF) 250 MCG/5ML IJ SOLN
INTRAMUSCULAR | Status: AC
  Filled 2020-10-25: qty 5

## 2020-10-25 MED ORDER — MIDAZOLAM HCL 2 MG/2ML IJ SOLN
INTRAMUSCULAR | Status: DC | PRN
  Administered 2020-10-25: 2 mg via INTRAVENOUS

## 2020-10-25 MED ORDER — PENTOXIFYLLINE ER 400 MG PO TBCR: 400.0000 mg | EXTENDED_RELEASE_TABLET | Freq: Three times a day (TID) | ORAL | 3 refills | Status: DC

## 2020-10-25 MED ORDER — ORAL CARE MOUTH RINSE: 15.0000 mL | Freq: Once | OROMUCOSAL | Status: AC

## 2020-10-25 MED ORDER — ONDANSETRON HCL 4 MG/2ML IJ SOLN
INTRAMUSCULAR | Status: AC
  Filled 2020-10-25: qty 2

## 2020-10-25 MED ORDER — CHLORHEXIDINE GLUCONATE 0.12 % MT SOLN
15.0000 mL | Freq: Once | OROMUCOSAL | Status: AC
  Administered 2020-10-25: 15 mL via OROMUCOSAL
  Filled 2020-10-25: qty 15

## 2020-10-25 MED ORDER — SCOPOLAMINE 1 MG/3DAYS TD PT72
1.0000 | MEDICATED_PATCH | TRANSDERMAL | Status: DC
  Administered 2020-10-25: 1.5 mg via TRANSDERMAL
  Filled 2020-10-25: qty 1

## 2020-10-25 MED ORDER — FENTANYL CITRATE (PF) 100 MCG/2ML IJ SOLN
INTRAMUSCULAR | Status: AC
  Filled 2020-10-25: qty 2

## 2020-10-25 MED ORDER — PROPOFOL 10 MG/ML IV BOLUS
INTRAVENOUS | Status: DC | PRN
  Administered 2020-10-25: 20 mg via INTRAVENOUS
  Administered 2020-10-25 (×2): 30 mg via INTRAVENOUS

## 2020-10-25 MED ORDER — PROPOFOL 10 MG/ML IV BOLUS
INTRAVENOUS | Status: AC
  Filled 2020-10-25: qty 20

## 2020-10-25 MED ORDER — OXYCODONE HCL 5 MG/5ML PO SOLN
5.0000 mg | Freq: Once | ORAL | Status: DC | PRN
Start: 2020-10-25 — End: 2020-10-25

## 2020-10-25 MED ORDER — 0.9 % SODIUM CHLORIDE (POUR BTL) OPTIME
TOPICAL | Status: DC | PRN
  Administered 2020-10-25: 1000 mL

## 2020-10-25 MED ORDER — MIDAZOLAM HCL 2 MG/2ML IJ SOLN
INTRAMUSCULAR | Status: AC
  Filled 2020-10-25: qty 2

## 2020-10-25 MED ORDER — ROCURONIUM BROMIDE 10 MG/ML (PF) SYRINGE
PREFILLED_SYRINGE | INTRAVENOUS | Status: AC
  Filled 2020-10-25: qty 10

## 2020-10-25 MED ORDER — OXYCODONE HCL 5 MG PO TABS: 5.0000 mg | ORAL_TABLET | Freq: Once | ORAL | Status: DC | PRN

## 2020-10-25 MED ORDER — LACTATED RINGERS IV SOLN: INTRAVENOUS | Status: DC

## 2020-10-25 MED ORDER — DEXAMETHASONE SODIUM PHOSPHATE 10 MG/ML IJ SOLN
INTRAMUSCULAR | Status: AC
  Filled 2020-10-25: qty 1

## 2020-10-25 MED ORDER — CEFAZOLIN SODIUM-DEXTROSE 2-4 GM/100ML-% IV SOLN
2.0000 g | INTRAVENOUS | Status: AC
  Administered 2020-10-25: 2 g via INTRAVENOUS
  Filled 2020-10-25: qty 100

## 2020-10-25 MED ORDER — TRAMADOL HCL 50 MG PO TABS: 50.0000 mg | ORAL_TABLET | Freq: Four times a day (QID) | ORAL | 0 refills | Status: DC | PRN

## 2020-10-25 SURGICAL SUPPLY — 37 items
BAG COUNTER SPONGE SURGICOUNT (BAG) IMPLANT
BAG SPNG CNTER NS LX DISP (BAG)
BLADE SURG 21 STRL SS (BLADE) ×3 IMPLANT
BNDG COHESIVE 6X5 TAN STRL LF (GAUZE/BANDAGES/DRESSINGS) IMPLANT
BNDG GAUZE ELAST 4 BULKY (GAUZE/BANDAGES/DRESSINGS) ×5 IMPLANT
COVER SURGICAL LIGHT HANDLE (MISCELLANEOUS) ×6 IMPLANT
DRAPE DERMATAC (DRAPES) ×1 IMPLANT
DRAPE U-SHAPE 47X51 STRL (DRAPES) ×3 IMPLANT
DRESSING PEEL AND PLC PRVNA 13 (GAUZE/BANDAGES/DRESSINGS) IMPLANT
DRSG ADAPTIC 3X8 NADH LF (GAUZE/BANDAGES/DRESSINGS) ×2 IMPLANT
DRSG PEEL AND PLACE PREVENA 13 (GAUZE/BANDAGES/DRESSINGS) ×3
DURAPREP 26ML APPLICATOR (WOUND CARE) ×3 IMPLANT
ELECT REM PT RETURN 9FT ADLT (ELECTROSURGICAL)
ELECTRODE REM PT RTRN 9FT ADLT (ELECTROSURGICAL) IMPLANT
GAUZE SPONGE 4X4 12PLY STRL (GAUZE/BANDAGES/DRESSINGS) ×2 IMPLANT
GLOVE SURG ORTHO LTX SZ9 (GLOVE) ×3 IMPLANT
GLOVE SURG UNDER POLY LF SZ9 (GLOVE) ×3 IMPLANT
GOWN STRL REUS W/ TWL XL LVL3 (GOWN DISPOSABLE) ×4 IMPLANT
GOWN STRL REUS W/TWL XL LVL3 (GOWN DISPOSABLE) ×6
HANDPIECE INTERPULSE COAX TIP (DISPOSABLE)
KIT BASIN OR (CUSTOM PROCEDURE TRAY) ×3 IMPLANT
KIT DRSG PREVENA PLUS 7DAY 125 (MISCELLANEOUS) ×1 IMPLANT
KIT TURNOVER KIT B (KITS) ×3 IMPLANT
MANIFOLD NEPTUNE II (INSTRUMENTS) ×3 IMPLANT
MICROMATRIX 500MG (Tissue) ×3 IMPLANT
NS IRRIG 1000ML POUR BTL (IV SOLUTION) ×3 IMPLANT
PACK ORTHO EXTREMITY (CUSTOM PROCEDURE TRAY) ×3 IMPLANT
PAD ARMBOARD 7.5X6 YLW CONV (MISCELLANEOUS) ×6 IMPLANT
SET HNDPC FAN SPRY TIP SCT (DISPOSABLE) IMPLANT
SOLUTION PARTIC MCRMTRX 500MG (Tissue) IMPLANT
STOCKINETTE IMPERVIOUS 9X36 MD (GAUZE/BANDAGES/DRESSINGS) IMPLANT
SUT ETHILON 2 0 PSLX (SUTURE) ×3 IMPLANT
SWAB COLLECTION DEVICE MRSA (MISCELLANEOUS) ×3 IMPLANT
SWAB CULTURE ESWAB REG 1ML (MISCELLANEOUS) IMPLANT
TOWEL GREEN STERILE (TOWEL DISPOSABLE) ×3 IMPLANT
TUBE CONNECTING 12X1/4 (SUCTIONS) ×3 IMPLANT
YANKAUER SUCT BULB TIP NO VENT (SUCTIONS) ×3 IMPLANT

## 2020-10-25 NOTE — Interval H&P Note (Signed)
History and Physical Interval Note:  10/25/2020 3:37 PM  Tracy [name]  has presented today for surgery, with the diagnosis of Abscess, Ulcer Left Foot.  The various methods of treatment have been discussed with the patient and family. After consideration of risks, benefits and other options for treatment, the patient has consented to  Procedure(s): LEFT FOOT DEBRIDEMENT (Left) [name] as a surgical intervention.  The patient's history has been reviewed, patient examined, no change in status, stable for surgery.  I have reviewed the patient's chart and labs.  Questions were answered to the patient's satisfaction.     Newt Minion

## 2020-10-25 NOTE — Op Note (Addendum)
10/25/2020  4:57 PM  PATIENT:  Tracy [name]    PRE-OPERATIVE DIAGNOSIS:  Abscess, Ulcer Left Foot  POST-OPERATIVE DIAGNOSIS: Abscess ulcer left foot with acute osteomyelitis  PROCEDURE:  LEFT FOOT DEBRIDEMENT, excision of skin and soft tissue muscle fascia and bone. Local tissue rearrangement for wound closure 9 x 4 cm. APPLICATION OF WOUND VAC Placement of ACell powder 500 mg.  SURGEON:  Newt Minion, MD  PHYSICIAN ASSISTANT:None ANESTHESIA:   General  PREOPERATIVE INDICATIONS:  [name] is a  35 y.o. female with a diagnosis of Abscess, Ulcer Left Foot who failed conservative measures and elected for surgical management.    The risks benefits and alternatives were discussed with the patient preoperatively including but not limited to the risks of infection, bleeding, nerve injury, cardiopulmonary complications, the need for revision surgery, among others, and the patient was willing to proceed.  OPERATIVE IMPLANTS: ACell powder 500 mg  '@ENCIMAGES'$ @  OPERATIVE FINDINGS: Patient had an abscess that extended down to the metatarsal.  The metatarsal and soft tissue was resected in one block of tissue and sent for cultures.  OPERATIVE PROCEDURE: Patient was brought to the operating room after undergoing a regional anesthetic.  After adequate levels anesthesia obtained patient's left lower extremity was prepped using DuraPrep draped into a sterile field a timeout was called.  Elliptical incision was made around the previous incision and the skin and soft tissue muscle fascia and bone was sent for cultures.  The wound was irrigated with normal saline electrocardio was used for hemostasis.  500 mg of ACell powder was used to fill the wound.  Local tissue rearrangement was used to close the wound 9 x 4 cm.  A Prevena wound VAC was applied outlined with derma tack this had a good suction fit patient was then taken to the PACU in stable condition.  Debridement type: Excisional  Debridement  Side: left  Body Location: foot   Tools used for debridement: scalpel and rongeur  Pre-debridement Wound size (cm):   Length: 1        Width: 1     Depth: 1   Post-debridement Wound size (cm):   Length: 9        Width: 4    Depth: 3   Debridement depth beyond dead/damaged tissue down to healthy viable tissue: yes  Tissue layer involved: skin, subcutaneous tissue, muscle / fascia, bone  Nature of tissue removed: Slough, Necrotic, Devitalized Tissue, and Non-viable tissue  Irrigation volume: 1 liter     Irrigation fluid type: Normal Saline    DISCHARGE PLANNING:  Antibiotic duration: She will continue her IV antibiotics through her IV port.  Weightbearing: Touchdown weightbearing on the left  Pain medication: Prescription for Ultram  Dressing care/ Wound VAC: Continue wound VAC for 1 week  Ambulatory devices: Walker or crutches  Discharge to: Home.  Follow-up: In the office 1 week post operative.

## 2020-10-25 NOTE — H&P (Signed)
Tracy [name] is an 35 y.o. female.   Chief Complaint: Left Foot wound HPI: Patient is a 35 year old woman who is seen in referral from Dr. Marcelino Scot.  Patient was involved in a horse accident where she was thrown from her horse on June 3.  Patient underwent open reduction internal fixation as well as several irrigation debridements.  Patient still has a persistent open wound with a deep tunneling area on the dorsum of her foot.  She has been on IV antibiotics has undergone excellent wound care with compression and packing the wound open and still has drainage without resolution of the ulcer.  Patient states she is also used some electrical stimulation.  Of note patient is a kidney pancreas transplant secondary to type 1 diabetes.  Patient's transplant was 2017 and she is on immunosuppressive therapy.  Past Medical History:  Diagnosis Date   Abscess of left foot 09/20/2020   Abscess of right thigh    Anemia    presently on iron supplement   Anxiety    ASCUS with positive high risk human papillomavirus of vagina 05/01/2020   04/2020 ASCUS +HPV 16 and other will get Colpo    Atypical squamous cell changes of undetermined significance (ASCUS) on vaginal cytology with positive high risk human papilloma virus (HPV) 09/05/2016   Get colpo   Chronic kidney disease    on dialysis T, Th, Sat-  2017 [place] Tansplant   CMV (cytomegalovirus infection) ([place])    Complication of anesthesia    Depression    Detached retina    Diabetes mellitus without complication ([place])    Type 1- had a pancreas transplant 2017   History of organ transplantation 08/18/2015   Kidney and Pancreates   HSV-1 (herpes simplex virus 1) infection    HSV-2 (herpes simplex virus 2) infection    Hypertension    Hypothyroidism    Irregular periods 07/05/2014   Neuropathy    in feet   Neuropathy    Osteomyelitis of left foot ([place]) 09/20/2020   PONV (postoperative nausea and vomiting)    Seizures ([place]) 01/2014   ? due to blood  sugar   Thyroid enlargement    "not on medication at this time"   Urinary tract infection    hx of   Vaginal discharge 07/05/2014   Vaginal Pap smear, abnormal    Yeast infection    took diflucan saturday     Past Surgical History:  Procedure Laterality Date   APPLICATION OF A-CELL OF EXTREMITY Right 10/03/2014   Procedure: APPLICATION OF A-CELL OF RIGHT UPPER THIGH WOUND;  Surgeon: Renette Butters, MD;  Location: [facility];  Service: Orthopedics;  Laterality: Right;   APPLICATION OF WOUND VAC Right 10/03/2014   Procedure: APPLICATION OF WOUND VAC RIGHT THIGH;  Surgeon: Renette Butters, MD;  Location: [facility];  Service: Orthopedics;  Laterality: Right;   AV FISTULA PLACEMENT Left 09/01/2014   Procedure: BRACHIOCEPHALIC ARTERIOVENOUS (AV) FISTULA CREATION;  Surgeon: Conrad Levering, MD;  Location: [facility];  Service: Vascular;  Laterality: Left;   CHOLECYSTECTOMY     COMBINED KIDNEY-PANCREAS TRANSPLANT  05/15/15   I & D EXTREMITY Right 08/14/2014   Procedure: IRRIGATION AND DEBRIDEMENT EXTREMITY WITH MUSCLE BIOPSY;  Surgeon: Roseanne Kaufman, MD;  Location: [facility];  Service: Orthopedics;  Laterality: Right;   I & D EXTREMITY Right 08/20/2014   Procedure:  I AND D LEG / THIGH ABSCESS AND MANIPULATION OF RIGHT ELBOW.;  Surgeon: Renette Butters, MD;  Location:  [facility];  Service: Orthopedics;  Laterality: Right;   I & D EXTREMITY Right 09/30/2014   Procedure: IRRIGATION AND DEBRIDEMENT RIGHT THIGH ABSCESS;  Surgeon: [name], MD;  Location: [facility];  Service: Orthopedics;  Laterality: Right;   I & D EXTREMITY Right 10/03/2014   Procedure: IRRIGATION AND DEBRIDEMENT RIGHT THIGH ABSCESS,WITH WOUND CLOSURE & MANIPULATION OF RIGHT KNEE;  Surgeon: Renette Butters, MD;  Location: [facility];  Service: Orthopedics;  Laterality: Right;   I & D EXTREMITY Left 09/19/2020   Procedure: IRRIGATION AND DEBRIDEMENT EXTREMITY;  Surgeon: Altamese Garfield, MD;  Location: [facility];  Service: Orthopedics;  Laterality: Left;   I & D EXTREMITY  Left 09/21/2020   Procedure: IRRIGATION AND DEBRIDEMENT EXTREMITY, partial excision bone 2nd metatarsal;  Surgeon: Altamese Carpio, MD;  Location: [facility];  Service: Orthopedics;  Laterality: Left;   INCISION AND DRAINAGE ABSCESS Right 12/09/2014   Procedure: INCISION AND DRAINAGE ABSCESS;  Surgeon: Renette Butters, MD;  Location: [facility];  Service: Orthopedics;  Laterality: Right;   INSERTION OF DIALYSIS CATHETER N/A 09/01/2014   Procedure: INSERTION OF DIALYSIS CATHETER IN RIGHT INTERNAL JUGUILAR;  Surgeon: Conrad Tripp, MD;  Location: [facility];  Service: Vascular;  Laterality: N/A;   KIDNEY TRANSPLANT  05/15/15   OPEN REDUCTION INTERNAL FIXATION (ORIF) FOOT LISFRANC FRACTURE Left 07/28/2020   Procedure: OPEN REDUCTION INTERNAL FIXATION (ORIF) FOOT LISFRANC FRACTURE;  Surgeon: Altamese [place], MD;  Location: [facility];  Service: Orthopedics;  Laterality: Left;   ORIF TOE FRACTURE Left 07/28/2020   Procedure: OPEN REDUCTION INTERNAL FIXATION (ORIF) METATARSAL (TOE) FRACTURE;  Surgeon: Altamese [place], MD;  Location: [facility];  Service: Orthopedics;  Laterality: Left;   PARS PLANA VITRECTOMY  10/01/2011   Procedure: PARS PLANA VITRECTOMY WITH 25 GAUGE;  Surgeon: Hayden Pedro, MD;  Location: [facility];  Service: Ophthalmology;  Laterality: Right;  Repair Complex Traction Retinal Detachment   PERCUTANEOUS PINNING Left 07/28/2020   Procedure: PERCUTANEOUS PINNING  2ND AND 3RD METATARSALS;  Surgeon: Altamese [place], MD;  Location: [place] [facility];  Service: Orthopedics;  Laterality: Left;   PILONIDAL CYST EXCISION     TRIGGER FINGER RELEASE Right 09/21/13   WISDOM TOOTH EXTRACTION      Family History  Problem Relation Age of Onset   Cancer Paternal Grandfather        prostate   Hyperlipidemia Paternal Grandfather    Stroke Paternal Grandfather    Social History:  reports that she has never smoked. She has never used smokeless tobacco. She reports that she does not drink alcohol and does not use drugs.  Allergies:  Allergies   Allergen Reactions   Penicillins Hives and Swelling    Patient is Cephalosporin tolerant    Sulfa Antibiotics Hives    No medications prior to admission.    No results found for this or any previous visit (from the past 48 hour(s)). No results found.  Review of Systems  All other systems reviewed and are negative.  There were no vitals taken for this visit. Physical Exam     Patient is alert, oriented, no adenopathy, well-dressed, normal affect, normal respiratory effort. Examination patient has swelling in the left foot and ankle.  She does have some hair growth on the foot and ankle.  I cannot palpate a pulse secondary to swelling the Doppler was used and she has a strong biphasic dorsalis pedis and posterior tibial pulse.  The wound is 5 mm in diameter and using a Q-tip there is  a large cavernous area that is about 2 cm in diameter with exposed metatarsal.  Patient's most recent cultures showed staph aureus.  Her most recent hemoglobin A1c is 6.0 with a sed rate of 93 and a C-reactive protein of 25.7.  Albumin 2.2.Heart RRR Lungs Clear Assessment/Plan 2. Non-pressure chronic ulcer of other part of left foot with necrosis of bone ([place])       Plan: With the exposed bone and deep venous ulcer with established biofilm recommend proceeding with excision of the infected soft tissue and bone sending this for cultures and placement of a cleanse choice wound VAC sponge and start therapy with a nitroglycerin patch and Trental to help improve the microcirculation.  We will place her in a postoperative shoe today.  Tracy Palmer Tricia [place], PA 10/25/2020, 6:37 AM

## 2020-10-25 NOTE — Transfer of Care (Signed)
Immediate Anesthesia Transfer of Care Note  Patient: Tracy [name]  Procedure(s) Performed: LEFT FOOT DEBRIDEMENT (Left) APPLICATION OF WOUND VAC  Patient Location: PACU  Anesthesia Type:MAC combined with regional for post-op pain  Level of Consciousness: awake, alert  and oriented  Airway & Oxygen Therapy: Patient Spontanous Breathing  Post-op Assessment: Report given to RN and Post -op Vital signs reviewed and stable  Post vital signs: Reviewed and stable  Last Vitals:  Vitals Value Taken Time  BP 125/73 10/25/20 1632  Temp    Pulse 70 10/25/20 1633  Resp 11 10/25/20 1633  SpO2 100 % 10/25/20 1633  Vitals shown include unvalidated device data.  Last Pain:  Vitals:   10/25/20 1309  TempSrc:   PainSc: 0-No pain         Complications: No notable events documented.

## 2020-10-25 NOTE — Anesthesia Procedure Notes (Signed)
Procedure Name: MAC Date/Time: 10/25/2020 3:52 PM Performed by: Inda Coke, CRNA Pre-anesthesia Checklist: Patient identified, Emergency Drugs available, Suction available, Timeout performed and Patient being monitored Patient Re-evaluated:Patient Re-evaluated prior to induction Oxygen Delivery Method: Nasal cannula Induction Type: IV induction Dental Injury: Teeth and Oropharynx as per pre-operative assessment

## 2020-10-25 NOTE — Anesthesia Preprocedure Evaluation (Addendum)
Anesthesia Evaluation  Patient identified by MRN, date of birth, ID band Patient awake    Reviewed: Allergy & Precautions, NPO status , Patient's Chart, lab work & pertinent test results  History of Anesthesia Complications (+) PONVNegative for: history of anesthetic complications  Airway Mallampati: II  TM Distance: >3 FB Neck ROM: Full    Dental  (+) Poor Dentition, Dental Advisory Given   Pulmonary neg pulmonary ROS,    Pulmonary exam normal breath sounds clear to auscultation       Cardiovascular hypertension, Pt. on medications Normal cardiovascular exam Rhythm:Regular Rate:Normal     Neuro/Psych Seizures -, Well Controlled,  PSYCHIATRIC DISORDERS Anxiety Depression  Neuromuscular disease    GI/Hepatic negative GI ROS, Neg liver ROS,   Endo/Other  diabetes, Well Controlled, Type 1Hypothyroidism Obesity No insulin since pancreatic transplant in 2017   Renal/GU Renal InsufficiencyRenal diseaseOff dialysis since renal transplant in 2017      Musculoskeletal negative musculoskeletal ROS (+)   Abdominal (+) + obese,   Peds  Hematology  (+) anemia ,   Anesthesia Other Findings    Reproductive/Obstetrics HSV                            Anesthesia Physical  Anesthesia Plan  ASA: 3  Anesthesia Plan: General   Post-op Pain Management:    Induction: Intravenous  PONV Risk Score and Plan: 3 and Treatment may vary due to age or medical condition, Ondansetron, Dexamethasone and Midazolam  Airway Management Planned: LMA  Additional Equipment: None  Intra-op Plan:   Post-operative Plan: Extubation in OR  Informed Consent: I have reviewed the patients History and Physical, chart, labs and discussed the procedure including the risks, benefits and alternatives for the proposed anesthesia with the patient or authorized representative who has indicated his/her understanding and  acceptance.     Dental advisory given  Plan Discussed with: CRNA and Anesthesiologist  Anesthesia Plan Comments:         Anesthesia Quick Evaluation

## 2020-10-25 NOTE — Anesthesia Procedure Notes (Signed)
Anesthesia Regional Block: Popliteal block   Pre-Anesthetic Checklist: , timeout performed,  Correct Patient, Correct Site, Correct Laterality,  Correct Procedure, Correct Position, site marked,  Risks and benefits discussed,  Surgical consent,  Pre-op evaluation,  At surgeon's request and post-op pain management  Laterality: Left  Prep: chloraprep       Needles:  Injection technique: Single-shot  Needle Type: Echogenic Stimulator Needle     Needle Length: 10cm  Needle Gauge: 21   Needle insertion depth: 7 cm   Additional Needles:   Procedures:,,,, ultrasound used (permanent image in chart),,    Narrative:  Start time: 10/25/2020 3:42 PM End time: 10/25/2020 3:47 PM Injection made incrementally with aspirations every 5 mL.  Performed by: Personally  Anesthesiologist: Josephine Igo, MD  Additional Notes: Timeout performed. Patient sedated. Relevant anatomy ID'd using [place]. Incremental 2-84m injection of LA with frequent aspiration. Patient tolerated procedure well.    Left Popliteal Block

## 2020-10-26 ENCOUNTER — Encounter (HOSPITAL_COMMUNITY): Payer: Self-pay | Admitting: Orthopedic Surgery

## 2020-10-26 DIAGNOSIS — Z949 Transplanted organ and tissue status, unspecified: Secondary | ICD-10-CM | POA: Diagnosis not present

## 2020-10-26 DIAGNOSIS — M86172 Other acute osteomyelitis, left ankle and foot: Secondary | ICD-10-CM | POA: Diagnosis not present

## 2020-10-26 DIAGNOSIS — L02612 Cutaneous abscess of left foot: Secondary | ICD-10-CM | POA: Diagnosis not present

## 2020-10-27 DIAGNOSIS — L02612 Cutaneous abscess of left foot: Secondary | ICD-10-CM | POA: Diagnosis not present

## 2020-10-27 DIAGNOSIS — M86172 Other acute osteomyelitis, left ankle and foot: Secondary | ICD-10-CM | POA: Diagnosis not present

## 2020-10-27 DIAGNOSIS — Z949 Transplanted organ and tissue status, unspecified: Secondary | ICD-10-CM | POA: Diagnosis not present

## 2020-10-27 NOTE — Anesthesia Postprocedure Evaluation (Signed)
Anesthesia Post Note  Patient: Tracy [name]  Procedure(s) Performed: LEFT FOOT DEBRIDEMENT (Left) APPLICATION OF WOUND VAC     Patient location during evaluation: PACU Anesthesia Type: General Level of consciousness: awake and alert Pain management: pain level controlled Vital Signs Assessment: post-procedure vital signs reviewed and stable Respiratory status: spontaneous breathing, nonlabored ventilation, respiratory function stable and patient connected to nasal cannula oxygen Cardiovascular status: blood pressure returned to baseline and stable Postop Assessment: no apparent nausea or vomiting Anesthetic complications: no   No notable events documented.  Last Vitals:  Vitals:   10/25/20 1646 10/25/20 1701  BP: 123/74 129/74  Pulse: 65 63  Resp: 14 14  Temp:  36.4 C  SpO2: 100% 100%    Last Pain:  Vitals:   10/25/20 1701  TempSrc:   PainSc: 0-No pain                 Khoa Opdahl S

## 2020-10-31 LAB — AEROBIC/ANAEROBIC CULTURE W GRAM STAIN (SURGICAL/DEEP WOUND): Culture: NO GROWTH

## 2020-11-01 ENCOUNTER — Ambulatory Visit: Payer: BC Managed Care – PPO

## 2020-11-01 ENCOUNTER — Encounter: Payer: Self-pay | Admitting: Infectious Diseases

## 2020-11-01 ENCOUNTER — Ambulatory Visit (INDEPENDENT_AMBULATORY_CARE_PROVIDER_SITE_OTHER): Payer: BC Managed Care – PPO | Admitting: Infectious Diseases

## 2020-11-01 ENCOUNTER — Ambulatory Visit (INDEPENDENT_AMBULATORY_CARE_PROVIDER_SITE_OTHER): Payer: BC Managed Care – PPO | Admitting: Family

## 2020-11-01 ENCOUNTER — Other Ambulatory Visit: Payer: Self-pay

## 2020-11-01 ENCOUNTER — Encounter: Payer: Self-pay | Admitting: Family

## 2020-11-01 VITALS — BP 153/83 | HR 79 | Temp 98.0°F | Wt 165.0 lb

## 2020-11-01 DIAGNOSIS — Z5181 Encounter for therapeutic drug level monitoring: Secondary | ICD-10-CM | POA: Diagnosis not present

## 2020-11-01 DIAGNOSIS — M86172 Other acute osteomyelitis, left ankle and foot: Secondary | ICD-10-CM

## 2020-11-01 DIAGNOSIS — M869 Osteomyelitis, unspecified: Secondary | ICD-10-CM | POA: Insufficient documentation

## 2020-11-01 DIAGNOSIS — T847XXD Infection and inflammatory reaction due to other internal orthopedic prosthetic devices, implants and grafts, subsequent encounter: Secondary | ICD-10-CM

## 2020-11-01 NOTE — Progress Notes (Signed)
[facility]                                                             [place], [place], [place], [address]                                                                  Phn. [phone]; Fax: [phone]                                                                             Date: 11/01/20  Reason for follow up: Hardware associated osteomyelitis   Assessment Problem List Items Addressed This Visit       Musculoskeletal and Integument   Pyogenic inflammation of bone ([place]) - Primary     Other   Hardware complicating wound infection ([place])   Medication monitoring encounter     Left ankle infection associated with hardware/left foot abscess and osteomyelitis - s/p left ORIF and pinning Lisfranc fracture 07/28/2020 . OR notes 09/19/20 reviewed. Discussed with Orthopedics with concerns for osteomyelitis. OR cultures 09/19/20 growing MSSA  - s/p left foot debridement and wound vac application on 7/74. OR cultures 10/25/20 No growth    2. Type 1 DM  3. 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 Medication Monitoring Labs 10/25/20 WBC 8.8, hb 11.2, plts 199, Cr 1.2 , ESR 13, Cr <1.    Plan -Continue cefazolin as is. Extend IV cefazolin for 4 more weeks from last OR on 9/14. New end date 10/12 -Follow up around end date of abtx 11/22/20.  -May need to extend to full 6 weeks course IV from 9/14 followed by PO abtx given hardware depending on clinical course and trend of inflammatory markers.  -Patient will follow up with one of my partners in next follow up as I will be away from clinic during that time. I have discussed this with patient as well.  -Follow up with Orthopedics as instructed  -Weekly CBC, BMP, ESR and CRP  All questions and concerns were discussed and addressed. Patient verbalized understanding of the  plan. ____________________________________________________________________________________________________________________ HPI/Interval events 11/01/20 Here for HFU for hardware associated osteomyelitis of her left ankle. After last seen on 10/04/20, patient was seen by Dr Sharol Given on 9/13 where there was concern for non healing of the left foot wound with exposed bone and deep venous ulcer with established biofilm. Patient was still complaining of wound being open and draining at that time. Hence, patient was taken to OR on 9/14 where left foot debridement was done with application of wound vac. OR notes reviewed " Patient had an abscess that extended down to the metatarsal.  The metatarsal and soft tissue was resected in one block of tissue  and sent for cultures". OR cultures no growth. She has been getting IV cefazolin from her rt chest port. Denies any issues with the port. Denies any side effects with the abtx like nausea, vomiting, abdominal pain and diarrhea. Wound not examined - still has a wound vac in place. Patient tells me wound has not been unwrapped after her last OR visit.   ROS: Constitutional: Negative for fever, chills, activity change, appetite change, fatigue and unexpected weight change.  Respiratory: Negative for cough, shortness of breath Cardiovascular: Negative for chest pain, palpitations and leg swelling.  Gastrointestinal: Negative for nausea, vomiting, abdominal pain, diarrhea/constipation, .  Genitourinary: Negative for dysuria, hematuria, flank pain Musculoskeletal: Negative for myalgias, arthralgia, back pain, joint swelling, Skin: Negative for rashes, lesions  Neurological: Negative for weakness, dizziness or headache  Past Medical History:  Diagnosis Date   Abscess of left foot 09/20/2020   Abscess of right thigh    Anemia    presently on iron supplement   Anxiety    ASCUS with positive high risk human papillomavirus of vagina 05/01/2020   04/2020 ASCUS +HPV 16 and  other will get Colpo    Atypical squamous cell changes of undetermined significance (ASCUS) on vaginal cytology with positive high risk human papilloma virus (HPV) 09/05/2016   Get colpo   Chronic kidney disease    on dialysis T, Th, Sat-  2017 [place] [facility]   CMV (cytomegalovirus infection) ([place])    Complication of anesthesia    Depression    Detached retina    Diabetes mellitus without complication ([place])    Type 1- had a pancreas transplant 2017   History of organ transplantation 08/18/2015   Kidney and Pancreates   HSV-1 (herpes simplex virus 1) infection    HSV-2 (herpes simplex virus 2) infection    Hypertension    Hypothyroidism    Irregular periods 07/05/2014   Neuropathy    in feet   Neuropathy    Osteomyelitis of left foot ([place]) 09/20/2020   PONV (postoperative nausea and vomiting)    Seizures ([place]) 01/2014   ? due to blood sugar   Thyroid enlargement    "not on medication at this time"   Urinary tract infection    hx of   Vaginal discharge 07/05/2014   Vaginal Pap smear, abnormal    Yeast infection    took diflucan saturday    Past Surgical History:  Procedure Laterality Date   APPLICATION OF A-CELL OF EXTREMITY Right 10/03/2014   Procedure: APPLICATION OF A-CELL OF RIGHT UPPER THIGH WOUND;  Surgeon: Renette Butters, MD;  Location: [facility];  Service: Orthopedics;  Laterality: Right;   APPLICATION OF WOUND VAC Right 10/03/2014   Procedure: APPLICATION OF WOUND VAC RIGHT THIGH;  Surgeon: Renette Butters, MD;  Location: [facility];  Service: Orthopedics;  Laterality: Right;   APPLICATION OF WOUND VAC  10/25/2020   Procedure: APPLICATION OF WOUND VAC;  Surgeon: Newt Minion, MD;  Location: [facility];  Service: Orthopedics;;   AV FISTULA PLACEMENT Left 09/01/2014   Procedure: BRACHIOCEPHALIC ARTERIOVENOUS (AV) FISTULA CREATION;  Surgeon: Conrad Guadalupe Guerra, MD;  Location: [facility];  Service: Vascular;  Laterality: Left;   CHOLECYSTECTOMY     COMBINED KIDNEY-PANCREAS TRANSPLANT   05/15/15   I & D EXTREMITY Right 08/14/2014   Procedure: IRRIGATION AND DEBRIDEMENT EXTREMITY WITH MUSCLE BIOPSY;  Surgeon: Roseanne Kaufman, MD;  Location: [facility];  Service: Orthopedics;  Laterality: Right;   I & D EXTREMITY Right 08/20/2014  Procedure:  I AND D LEG / THIGH ABSCESS AND MANIPULATION OF RIGHT ELBOW.;  Surgeon: Renette Butters, MD;  Location: [facility];  Service: Orthopedics;  Laterality: Right;   I & D EXTREMITY Right 09/30/2014   Procedure: IRRIGATION AND DEBRIDEMENT RIGHT THIGH ABSCESS;  Surgeon: [name], MD;  Location: [facility];  Service: Orthopedics;  Laterality: Right;   I & D EXTREMITY Right 10/03/2014   Procedure: IRRIGATION AND DEBRIDEMENT RIGHT THIGH ABSCESS,WITH WOUND CLOSURE & MANIPULATION OF RIGHT KNEE;  Surgeon: Renette Butters, MD;  Location: [facility];  Service: Orthopedics;  Laterality: Right;   I & D EXTREMITY Left 09/19/2020   Procedure: IRRIGATION AND DEBRIDEMENT EXTREMITY;  Surgeon: Altamese Wrightwood, MD;  Location: [facility];  Service: Orthopedics;  Laterality: Left;   I & D EXTREMITY Left 09/21/2020   Procedure: IRRIGATION AND DEBRIDEMENT EXTREMITY, partial excision bone 2nd metatarsal;  Surgeon: Altamese Loma, MD;  Location: [facility];  Service: Orthopedics;  Laterality: Left;   I & D EXTREMITY Left 10/25/2020   Procedure: LEFT FOOT DEBRIDEMENT;  Surgeon: Newt Minion, MD;  Location: [facility];  Service: Orthopedics;  Laterality: Left;   INCISION AND DRAINAGE ABSCESS Right 12/09/2014   Procedure: INCISION AND DRAINAGE ABSCESS;  Surgeon: Renette Butters, MD;  Location: [facility];  Service: Orthopedics;  Laterality: Right;   INSERTION OF DIALYSIS CATHETER N/A 09/01/2014   Procedure: INSERTION OF DIALYSIS CATHETER IN RIGHT INTERNAL JUGUILAR;  Surgeon: Conrad [place], MD;  Location: [facility];  Service: Vascular;  Laterality: N/A;   KIDNEY TRANSPLANT  05/15/15   OPEN REDUCTION INTERNAL FIXATION (ORIF) FOOT LISFRANC FRACTURE Left 07/28/2020   Procedure: OPEN REDUCTION INTERNAL FIXATION (ORIF) FOOT  LISFRANC FRACTURE;  Surgeon: Altamese Landess, MD;  Location: [facility];  Service: Orthopedics;  Laterality: Left;   ORIF TOE FRACTURE Left 07/28/2020   Procedure: OPEN REDUCTION INTERNAL FIXATION (ORIF) METATARSAL (TOE) FRACTURE;  Surgeon: Altamese Ada, MD;  Location: [facility];  Service: Orthopedics;  Laterality: Left;   PARS PLANA VITRECTOMY  10/01/2011   Procedure: PARS PLANA VITRECTOMY WITH 25 GAUGE;  Surgeon: Hayden Pedro, MD;  Location: [facility];  Service: Ophthalmology;  Laterality: Right;  Repair Complex Traction Retinal Detachment   PERCUTANEOUS PINNING Left 07/28/2020   Procedure: PERCUTANEOUS PINNING  2ND AND 3RD METATARSALS;  Surgeon: Altamese [place]ville, MD;  Location: [facility];  Service: Orthopedics;  Laterality: Left;   PILONIDAL CYST EXCISION     TRIGGER FINGER RELEASE Right 09/21/13   WISDOM TOOTH EXTRACTION     Current Outpatient Medications on File Prior to Visit  Medication Sig Dispense Refill   aspirin EC 325 MG tablet Take 1 tablet (325 mg total) by mouth daily. 30 tablet 0   ceFAZolin (ANCEF) IVPB Inject 2 g into the vein every 8 (eight) hours. Indication:  Left ankle infection with hardware First Dose: Yes Last Day of Therapy:  11/02/20 Labs - Once weekly:  CBC/D and BMP, Labs - Every other week:  ESR and CRP Method of administration: IV Push Method of administration may be changed at the discretion of home infusion pharmacist based upon assessment of the patient and/or caregiver's ability to self-administer the medication ordered. 126 Units 0   gabapentin (NEURONTIN) 300 MG capsule Take 300 mg by mouth 3 (three) times daily as needed (neuropathy).     lidocaine-prilocaine (EMLA) cream Apply 1 application topically as needed. Port access     LORazepam (ATIVAN) 0.5 MG tablet Take 1 tablet (0.5 mg total) by mouth every  8 (eight) hours. (Patient taking differently: Take 0.5 mg by mouth every 8 (eight) hours as needed for anxiety.) 30 tablet 0   medroxyPROGESTERone (DEPO-PROVERA) 150 MG/ML  injection Inject 1 mL (150 mg total) into the muscle every 3 (three) months.     mycophenolate (MYFORTIC) 180 MG EC tablet Take 540 mg by mouth 2 (two) times daily.     nitroGLYCERIN (NITRODUR - DOSED IN MG/24 HR) 0.2 mg/hr patch Place 1 patch (0.2 mg total) onto the skin daily. 30 patch 12   pentoxifylline (TRENTAL) 400 MG CR tablet Take 1 tablet (400 mg total) by mouth 3 (three) times daily with meals. 90 tablet 3   predniSONE (DELTASONE) 10 MG tablet Take 10 mg by mouth daily with breakfast.     tacrolimus (PROGRAF) 1 MG capsule Take 2 mg by mouth 2 (two) times daily.     traMADol (ULTRAM) 50 MG tablet Take 1 tablet (50 mg total) by mouth every 6 (six) hours as needed. 30 tablet 0   traZODone (DESYREL) 50 MG tablet Take 1 tablet (50 mg total) by mouth at bedtime as needed for sleep. (Patient taking differently: Take 50-100 mg by mouth at bedtime as needed for sleep.)     VITAMIN D PO Take 1 capsule by mouth daily.     No current facility-administered medications on file prior to visit.   Allergies  Allergen Reactions   Penicillins Hives and Swelling    Patient is Cephalosporin tolerant    Sulfa Antibiotics Hives   Social History   Socioeconomic History   Marital status: Married    Spouse name: Audelia Acton   Number of children: 0   Years of education: College   Highest education level: Not on file  Occupational History    Employer: [employer]  Tobacco Use   Smoking status: Never   Smokeless tobacco: Never  Vaping Use   Vaping Use: Never used  Substance and Sexual Activity   Alcohol use: No    Alcohol/week: 0.0 standard drinks   Drug use: No   Sexual activity: Yes    Birth control/protection: Injection  Other Topics Concern   Not on file  Social History Narrative   Patient lives at home with family.   Caffeine Use: 1 soda daily   Social Determinants of Health   Financial Resource Strain: Low Risk    Difficulty of Paying Living Expenses: Not hard at all  Food Insecurity: No Food  Insecurity   Worried About Charity fundraiser in the Last Year: Never true   Ran Out of Food in the Last Year: Never true  Transportation Needs: No Transportation Needs   Lack of Transportation (Medical): No   Lack of Transportation (Non-Medical): No  Physical Activity: Sufficiently Active   Days of Exercise per Week: 7 days   Minutes of Exercise per Session: 90 min  Stress: Stress Concern Present   Feeling of Stress : To some extent  Social Connections: Engineer, building services of Communication with Friends and Family: More than three times a week   Frequency of Social Gatherings with Friends and Family: Twice a week   Attends Religious Services: More than 4 times per year   Active Member of [employer] or Organizations: Yes   Attends Music therapist: More than 4 times per year   Marital Status: Married  Human resources officer Violence: Not At Risk   Fear of Current or Ex-Partner: No   Emotionally Abused: No   Physically Abused: No  Sexually Abused: No   Family History  Problem Relation Age of Onset   Cancer Paternal Grandfather        prostate   Hyperlipidemia Paternal Grandfather    Stroke Paternal Grandfather      Vitals BP (!) 153/83   Pulse 79   Temp 98 F (36.7 C) (Oral)   Wt 165 lb (74.8 kg)   LMP  (LMP Unknown) Comment: spotting  SpO2 100%   BMI 30.18 kg/m     Examination  General - not in acute distress, comfortably sitting in chair HEENT - PEERLA, no pallor and no icterus Chest - b/l clear air entry, no additional sounds CVS- Normal s1s2, RRR Abdomen - Soft, Non tender , non distended Ext-  Left foot wrapped in a bandage and has a wound vac. ( Not examined as patient still has a wound vac and wound has not been unwrapped after her last OR ) Neuro: grossly normal Psych : calm and cooperative   Recent labs CBC Latest Ref Rng & Units 10/25/2020 09/23/2020 07/28/2020  WBC 4.0 - 10.5 K/uL 8.1 9.1 7.3  Hemoglobin 12.0 - 15.0 g/dL 10.8(L)  8.5(L) 12.4  Hematocrit 36.0 - 46.0 % 34.8(L) 28.4(L) 40.2  Platelets 150 - 400 K/uL 190 246 219   CMP Latest Ref Rng & Units 10/25/2020 09/23/2020 09/22/2020  Glucose 70 - 99 mg/dL 104(H) 115(H) 133(H)  BUN 6 - 20 mg/dL 18 22(H) 19  Creatinine 0.44 - 1.00 mg/dL 1.14(H) 1.16(H) 1.34(H)  Sodium 135 - 145 mmol/L 139 137 137  Potassium 3.5 - 5.1 mmol/L 3.6 4.9 5.2(H)  Chloride 98 - 111 mmol/L 114(H) 114(H) 112(H)  CO2 22 - 32 mmol/L 19(L) 19(L) 21(L)  Calcium 8.9 - 10.3 mg/dL 9.2 8.3(L) 8.6(L)  Total Protein 6.5 - 8.1 g/dL - 5.1(L) -  Total Bilirubin 0.3 - 1.2 mg/dL - 0.5 -  Alkaline Phos 38 - 126 U/L - 96 -  AST 15 - 41 U/L - 12(L) -  ALT 0 - 44 U/L - 8 -    Pertinent Microbiology Results for orders placed or performed during the hospital encounter of 10/25/20  Aerobic/Anaerobic Culture w Gram Stain (surgical/deep wound)     Status: None   Collection Time: 10/25/20  4:08 PM   Specimen: PATH Soft tissue  Result Value Ref Range Status   Specimen Description TISSUE  Final   Special Requests  LEFT FOOT SOFT TISSUE  Final   Gram Stain   Final    RARE WBC PRESENT, PREDOMINANTLY MONONUCLEAR NO ORGANISMS SEEN    Culture   Final    No growth aerobically or anaerobically. Performed at [facility], [place] [address] [place], [place] [address]    Report Status 10/31/2020 FINAL  Final      Pertinent Imaging All pertinent labs/Imagings/notes reviewed. All pertinent plain films and CT images have been personally visualized and interpreted; radiology reports have been reviewed. Decision making incorporated into the Impression / Recommendations.  I have spent a total of 35 minutes of face-to-face and non-face-to-face time, excluding clinical staff time, preparing to see patient, ordering tests and/or medications, and provide counseling the patient    Electronically signed by:  Rosiland Oz, MD Infectious Disease Physician [facility] [address] [place], [place] [address] Phone: [phone]  Fax: [phone]

## 2020-11-01 NOTE — Progress Notes (Signed)
Post-Op Visit Note   Patient: Tracy [name]           Date of Birth: 10-20-1985           MRN: [id number] Visit Date: 11/01/2020 PCP: Patient, No Pcp Per (Inactive)  Chief Complaint:  Chief Complaint  Patient presents with   Left Foot - Routine Post Op    10/25/20 left foot debridement     HPI:  HPI The patient is a 35 year old woman who presents 1 week status post left foot irrigation debridement with placement of a wound VAC.  This was her third I&D. Ortho Exam Incision well approximated sutures there is no surrounding erythema no active drainage no warmth no edema  Visit Diagnoses: No diagnosis found.  Plan: Begin daily Dial soap cleansing.  Dry dressing changes.  Continue postop shoe.  She may touchdown weight-bear especially for transfers.  She will minimize her weightbearing and use crutches.  Follow-up in 1 week.  Evaluate for suture removal at that time.  She states she has 3 more weeks of IV antibiotics through her port.  She is following with infectious diseases.  Follow-Up Instructions: Return in about 1 week (around 11/08/2020).   Imaging: No results found.  Orders:  No orders of the defined types were placed in this encounter.  No orders of the defined types were placed in this encounter.    PMFS History: Patient Active Problem List   Diagnosis Date Noted   Pyogenic inflammation of bone ([place]) 0000000   Hardware complicating wound infection ([place]) 10/04/2020   Medication monitoring encounter 10/04/2020   Abscess of left foot 09/20/2020   Osteomyelitis of foot, left, acute ([place]) 09/20/2020   Cellulitis of left foot 09/19/2020   ASCUS with positive high risk human papillomavirus of vagina 05/01/2020   Abnormal uterine bleeding (AUB) 04/19/2020   History of abnormal cervical Pap smear 10/06/2017   Encounter for gynecological examination with Papanicolaou smear of cervix 10/06/2017   MDD (major depressive disorder), recurrent episode, moderate ([place])  03/19/2017   Atypical squamous cell changes of undetermined significance (ASCUS) on vaginal cytology with positive high risk human papilloma virus (HPV) 09/05/2016   Encounter for surveillance of injectable contraceptive 08/30/2016   Urinary tract infection with hematuria 06/05/2016   Bad odor of urine 06/05/2016   Irregular intermenstrual bleeding 01/18/2016   Anxiety 01/18/2016   History of organ transplantation 08/18/2015   Abscess of calf 12/21/2014   Escherichia coli (E. 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, not stated as uncontrolled 10/16/2012   Hypothyroidism 10/15/2012   Proliferative diabetic retinopathy ([place]) 09/27/2011   Past Medical History:  Diagnosis Date   Abscess of left foot 09/20/2020   Abscess of right thigh    Anemia    presently on iron supplement   Anxiety    ASCUS with positive high risk human papillomavirus of vagina 05/01/2020   04/2020 ASCUS +HPV 16 and other will get Colpo    Atypical squamous cell changes of undetermined significance (ASCUS) on vaginal cytology with positive high risk human papilloma virus (HPV) 09/05/2016   Get colpo   Chronic kidney disease  on dialysis T, Th, Sat-  2017 [place] Tansplant   CMV (cytomegalovirus infection) ([place])    Complication of anesthesia    Depression    Detached retina    Diabetes mellitus without complication ([place])    Type 1- had a pancreas transplant 2017   History of organ transplantation 08/18/2015   Kidney and Pancreates   HSV-1 (herpes simplex virus 1) infection    HSV-2 (herpes simplex virus 2) infection     Hypertension    Hypothyroidism    Irregular periods 07/05/2014   Neuropathy    in feet   Neuropathy    Osteomyelitis of left foot (H[facility]) 09/20/2020   PONV (postoperative nausea and vomiting)    Seizures ([place]) 01/2014   ? due to blood sugar   Thyroid enlargement    "not on medication at this time"   Urinary tract infection    hx of   Vaginal discharge 07/05/2014   Vaginal Pap smear, abnormal    Yeast infection    took diflucan saturday     Family History  Problem Relation Age of Onset   Cancer Paternal Grandfather        prostate   Hyperlipidemia Paternal Grandfather    Stroke Paternal Grandfather     Past Surgical History:  Procedure Laterality Date   APPLICATION OF A-CELL OF EXTREMITY Right 10/03/2014   Procedure: APPLICATION OF A-CELL OF RIGHT UPPER THIGH WOUND;  Surgeon: Renette Butters, MD;  Location: [facility];  Service: Orthopedics;  Laterality: Right;   APPLICATION OF WOUND VAC Right 10/03/2014   Procedure: APPLICATION OF WOUND VAC RIGHT THIGH;  Surgeon: Renette Butters, MD;  Location: [place];  Service: Orthopedics;  Laterality: Right;   APPLICATION OF WOUND VAC  10/25/2020   Procedure: APPLICATION OF WOUND VAC;  Surgeon: Newt Minion, MD;  Location: [place];  Service: Orthopedics;;   AV FISTULA PLACEMENT Left 09/01/2014   Procedure: BRACHIOCEPHALIC ARTERIOVENOUS (AV) FISTULA CREATION;  Surgeon: Conrad Waterflow, MD;  Location: [place];  Service: Vascular;  Laterality: Left;   CHOLECYSTECTOMY     COMBINED KIDNEY-PANCREAS TRANSPLANT  05/15/15   I & D EXTREMITY Right 08/14/2014   Procedure: IRRIGATION AND DEBRIDEMENT EXTREMITY WITH MUSCLE BIOPSY;  Surgeon: Roseanne Kaufman, MD;  Location: [place];  Service: Orthopedics;  Laterality: Right;   I & D EXTREMITY Right 08/20/2014   Procedure:  I AND D LEG / THIGH ABSCESS AND MANIPULATION OF RIGHT ELBOW.;  Surgeon: Renette Butters, MD;  Location: [place];  Service: Orthopedics;  Laterality: Right;   I & D EXTREMITY Right 09/30/2014   Procedure:  IRRIGATION AND DEBRIDEMENT RIGHT THIGH ABSCESS;  Surgeon: [name], MD;  Location: [place];  Service: Orthopedics;  Laterality: Right;   I & D EXTREMITY Right 10/03/2014   Procedure: IRRIGATION AND DEBRIDEMENT RIGHT THIGH ABSCESS,WITH WOUND CLOSURE & MANIPULATION OF RIGHT KNEE;  Surgeon: Renette Butters, MD;  Location: [place];  Service: Orthopedics;  Laterality: Right;   I & D EXTREMITY Left 09/19/2020   Procedure: IRRIGATION AND DEBRIDEMENT EXTREMITY;  Surgeon: Altamese Senath, MD;  Location: [place];  Service: Orthopedics;  Laterality: Left;   I & D EXTREMITY Left 09/21/2020   Procedure: IRRIGATION AND DEBRIDEMENT EXTREMITY, partial excision bone 2nd metatarsal;  Surgeon: Altamese Hissop, MD;  Location: [place];  Service: Orthopedics;  Laterality: Left;   I & D EXTREMITY Left 10/25/2020   Procedure: LEFT FOOT DEBRIDEMENT;  Surgeon: Newt Minion, MD;  Location: [place];  Service: Orthopedics;  Laterality: Left;   INCISION AND DRAINAGE ABSCESS Right 12/09/2014   Procedure: INCISION AND DRAINAGE ABSCESS;  Surgeon: Renette Butters, MD;  Location: [place];  Service: Orthopedics;  Laterality: Right;   INSERTION OF DIALYSIS CATHETER N/A 09/01/2014   Procedure: INSERTION OF DIALYSIS CATHETER IN RIGHT INTERNAL JUGUILAR;  Surgeon: Conrad La[place], MD;  Location: [place];  Service: Vascular;  Laterality: N/A;   KIDNEY TRANSPLANT  05/15/15   OPEN REDUCTION INTERNAL FIXATION (ORIF) FOOT LISFRANC FRACTURE Left 07/28/2020   Procedure: OPEN REDUCTION INTERNAL FIXATION (ORIF) FOOT LISFRANC FRACTURE;  Surgeon: Altamese Bromide, MD;  Location: [place];  Service: Orthopedics;  Laterality: Left;   ORIF TOE FRACTURE Left 07/28/2020   Procedure: OPEN REDUCTION INTERNAL FIXATION (ORIF) METATARSAL (TOE) FRACTURE;  Surgeon: Altamese [place]amont, MD;  Location: [place];  Service: Orthopedics;  Laterality: Left;   PARS PLANA VITRECTOMY  10/01/2011   Procedure: PARS PLANA VITRECTOMY WITH 25 GAUGE;  Surgeon: Hayden Pedro, MD;  Location: [place];  Service:  Ophthalmology;  Laterality: Right;  Repair Complex Traction Retinal Detachment   PERCUTANEOUS PINNING Left 07/28/2020   Procedure: PERCUTANEOUS PINNING  2ND AND 3RD METATARSALS;  Surgeon: Altamese [place], MD;  Location: [place];  Service: Orthopedics;  Laterality: Left;   PILONIDAL CYST EXCISION     TRIGGER FINGER RELEASE Right 09/21/13   WISDOM TOOTH EXTRACTION     Social History   Occupational History    Employer: [employer]  Tobacco Use   Smoking status: Never   Smokeless tobacco: Never  Vaping Use   Vaping Use: Never used  Substance and Sexual Activity   Alcohol use: No    Alcohol/week: 0.0 standard drinks   Drug use: No   Sexual activity: Yes    Birth control/protection: Injection

## 2020-11-02 ENCOUNTER — Telehealth: Payer: Self-pay

## 2020-11-02 ENCOUNTER — Ambulatory Visit (INDEPENDENT_AMBULATORY_CARE_PROVIDER_SITE_OTHER): Payer: BC Managed Care – PPO | Admitting: Orthopedic Surgery

## 2020-11-02 ENCOUNTER — Telehealth: Payer: Self-pay | Admitting: Orthopedic Surgery

## 2020-11-02 DIAGNOSIS — M86272 Subacute osteomyelitis, left ankle and foot: Secondary | ICD-10-CM

## 2020-11-02 NOTE — Telephone Encounter (Signed)
Patient called wanting to know if it is normal to having a lot of bleeding from incision site? Stated that her left foot was bleeding through her dressing last night, then when she removed it this morning to take a shower, her left foot started bleeding again.  CB# [phone].  Please advise.  Thank you.

## 2020-11-02 NOTE — Telephone Encounter (Signed)
I called and sw pt ans she will come in this afternoon for eval with Dr. Sharol Given.

## 2020-11-02 NOTE — Telephone Encounter (Signed)
Patient had left foot surgery on 10/25/2020.

## 2020-11-06 ENCOUNTER — Telehealth: Payer: Self-pay

## 2020-11-06 NOTE — Telephone Encounter (Signed)
Thanks Meg

## 2020-11-06 NOTE — Telephone Encounter (Signed)
Received voicemail from patient that she has not received any further IV antibiotics. She says she has enough for today and tomorrow. Per Dr. Levonne Spiller note, IV cefazolin should be extended to 11/22/20. RN gave verbal orders to [place] at [facility] to extend IV cefazolin to 11/22/20. Orders repeated and verified.   RN made patient aware.   Beryle Flock, RN

## 2020-11-08 ENCOUNTER — Encounter: Payer: BC Managed Care – PPO | Admitting: Family

## 2020-11-08 ENCOUNTER — Telehealth: Payer: Self-pay | Admitting: Adult Health

## 2020-11-08 DIAGNOSIS — Z3042 Encounter for surveillance of injectable contraceptive: Secondary | ICD-10-CM

## 2020-11-08 MED ORDER — MEDROXYPROGESTERONE ACETATE 150 MG/ML IM SUSP: 150.0000 mg | INTRAMUSCULAR | 3 refills | Status: DC

## 2020-11-08 NOTE — Telephone Encounter (Signed)
Refilled depo 

## 2020-11-08 NOTE — Telephone Encounter (Signed)
Patient called stating that she would like Anderson Malta to call her Depo to the [facility], patient states it is cheaper. Patient states she does not need a call back. Pt states to please have Anderson Malta refill it by this afternoon so she can pick it up for her appointment tomorrow.

## 2020-11-08 NOTE — Addendum Note (Signed)
Addended by: Derrek Monaco A on: 11/08/2020 11:06 AM   Modules accepted: Orders

## 2020-11-09 ENCOUNTER — Other Ambulatory Visit: Payer: Self-pay

## 2020-11-09 ENCOUNTER — Ambulatory Visit (INDEPENDENT_AMBULATORY_CARE_PROVIDER_SITE_OTHER): Payer: BC Managed Care – PPO | Admitting: Orthopedic Surgery

## 2020-11-09 ENCOUNTER — Ambulatory Visit (INDEPENDENT_AMBULATORY_CARE_PROVIDER_SITE_OTHER): Payer: BC Managed Care – PPO

## 2020-11-09 DIAGNOSIS — Z3042 Encounter for surveillance of injectable contraceptive: Secondary | ICD-10-CM | POA: Diagnosis not present

## 2020-11-09 DIAGNOSIS — M86272 Subacute osteomyelitis, left ankle and foot: Secondary | ICD-10-CM

## 2020-11-09 MED ORDER — MEDROXYPROGESTERONE ACETATE 150 MG/ML IM SUSP
150.0000 mg | Freq: Once | INTRAMUSCULAR | Status: AC
  Administered 2020-11-09: 150 mg via INTRAMUSCULAR

## 2020-11-09 NOTE — Progress Notes (Signed)
   NURSE VISIT- INJECTION  SUBJECTIVE:  Tracy [name] is a 35 y.o. [id number] female here for a Depo Provera for contraception/period management. She is a GYN patient.   OBJECTIVE:  LMP  (LMP Unknown) Comment: spotting  Appears well, in no apparent distress  Injection administered in: Right deltoid  Meds ordered this encounter  Medications   medroxyPROGESTERone (DEPO-PROVERA) injection 150 mg    ASSESSMENT: GYN patient Depo Provera for contraception/period management PLAN: Follow-up: in 11-13 weeks for next Depo   Laiyah Exline A Judaea Burgoon  11/09/2020 10:24 AM

## 2020-11-16 ENCOUNTER — Telehealth: Payer: Self-pay | Admitting: Pharmacist

## 2020-11-16 NOTE — Telephone Encounter (Signed)
Patient is currently receiving Ancef through 10/12 per Dr. West Bali for an ankle infection with hardware. She has a follow up scheduled on that day with Dr. Linus Salmons. She called asking when the nurse should deaccess her port and if it was ok to do it on 10/11. Per Dorie, RN we do not have supplies here to do it in office.

## 2020-11-20 ENCOUNTER — Encounter: Payer: Self-pay | Admitting: Orthopedic Surgery

## 2020-11-20 ENCOUNTER — Telehealth: Payer: Self-pay

## 2020-11-20 NOTE — Telephone Encounter (Signed)
Home health RN returned call. RN informed her that office cannot deaccess port and further decisions about whether port will need to remain accessed will be decided after labs have resulted. Current end date is 10/12. Pharmacy team is working with providers to determine next steps. [facility] has been notified and will reach out to [place] to follow up with any concerns .  Tracy [name] Lorita Officer, RN

## 2020-11-20 NOTE — Telephone Encounter (Signed)
Received call from patient to follow up on whether our office could de-access her port. RN informed her that our office does not de-access or access ports and that her home health team should be able to do so. Patient called back later this afternoon to report that the home health RN told her that our office should be able to as the procedure is "a clean procedure" vs a "sterile procedure". RN explained that it is not the procedure type that does not allow [place] to deaccess it; specific training is required as well as approval from the the Vascular access team. RN called home health RN to explain that our office will not be deaccessing and requested a call back.   RN also called [facility] to let them know of situation; patient is being followed by [facility]. RN spoke with Nira Conn who stated if the home health RN is not able to deaccess the port then she will escalate concerns up chain of command.   Home Health RN - [place]   Carlean Purl, RN

## 2020-11-20 NOTE — Progress Notes (Signed)
Office Visit Note   Patient: Tracy [name]           Date of Birth: [birth year]           MRN: [id number] Visit Date: 11/02/2020              Requested by: No referring provider defined for this encounter. PCP: Patient, No Pcp Per (Inactive)  Chief Complaint  Patient presents with   Left Foot - Routine Post Op    10/25/20 left debridement       HPI: Patient presents almost 4 weeks status post debridement left foot through a dorsal incision resection of the metatarsal head with debridement.  Patient currently is on a PICC line with antibiotics.  She states she has had some bloody drainage is wearing her compression stocking the stitches are intact she is in a postoperative shoe nonweightbearing using a nitroglycerin patch and Trent[place].  Assessment & Plan: Visit Diagnoses: No diagnosis found.  Plan: Continue with protected weightbearing washing with soap and water wearing the compression stocking.  Follow-Up Instructions: No follow-ups on file.   Ortho Exam  Patient is alert, oriented, no adenopathy, well-dressed, normal affect, normal respiratory effort. Examination there is some swelling there is no redness no cellulitis.  She moves her toes and ankle well.  The wound edges are well approximated.  Imaging: No results found. No images are attached to the encounter.  Labs: Lab Results  Component Value Date   HGBA1C 6.0 (H) 09/19/2020   HGBA1C 8.9 (H) 08/10/2014   HGBA1C 16.8 (H) 01/20/2013   ESRSEDRATE 93 (H) 09/19/2020   ESRSEDRATE 12 11/09/2014   ESRSEDRATE 112 (H) 08/09/2014   CRP 25.7 (H) 09/19/2020   CRP <0.5 11/09/2014   CRP 28.5 (H) 08/09/2014   REPTSTATUS 10/31/2020 FINAL 10/25/2020   GRAMSTAIN  10/25/2020    RARE WBC PRESENT, PREDOMINANTLY MONONUCLEAR NO ORGANISMS SEEN    CULT  10/25/2020    No growth aerobically or anaerobically. Performed at [facility], [place] [address] [place], [place] [address]    LABORGA STAPHYLOCOCCUS AUREUS 09/19/2020      Lab Results  Component Value Date   ALBUMIN 2.2 (L) 09/23/2020   ALBUMIN 2.3 (L) 09/20/2020   ALBUMIN 3.2 (L) 07/28/2020    Lab Results  Component Value Date   MG 2.0 08/09/2014   MG 2.3 01/25/2014   MG 1.6 01/22/2014   No results found for: VD25OH  No results found for: PREALBUMIN CBC EXTENDED Latest Ref Rng & Units 10/25/2020 09/23/2020 07/28/2020  WBC 4.0 - 10.5 K/uL 8.1 9.1 7.3  RBC 3.87 - 5.11 MIL/uL 3.83(L) 3.16(L) 4.40  HGB 12.0 - 15.0 g/dL 10.8(L) 8.5(L) 12.4  HCT 36.0 - 46.0 % 34.8(L) 28.4(L) 40.2  PLT 150 - 400 K/uL 190 246 219  NEUTROABS 1.7 - 7.7 K/uL - - 5.5  LYMPHSABS 0.7 - 4.0 K/uL - - 1.1     There is no height or weight on file to calculate BMI.  Orders:  No orders of the defined types were placed in this encounter.  No orders of the defined types were placed in this encounter.    Procedures: No procedures performed  Clinical Data: No additional findings.  ROS:  All other systems negative, except as noted in the HPI. Review of Systems  Objective: Vital Signs: LMP  (LMP Unknown) Comment: spotting  Specialty Comments:  No specialty comments available.  PMFS History: Patient Active Problem List   Diagnosis Date Noted  Pyogenic inflammation of bone ([place]) 0000000   Hardware complicating wound infection ([place]) 10/04/2020   Medication monitoring encounter 10/04/2020   Abscess of left foot 09/20/2020   Osteomyelitis of foot, left, acute ([place]) 09/20/2020   Cellulitis of left foot 09/19/2020   ASCUS with positive high risk human papillomavirus of vagina 05/01/2020   Abnormal uterine bleeding (AUB) 04/19/2020   History of abnormal cervical Pap smear 10/06/2017   Encounter for gynecological examination with Papanicolaou smear of cervix 10/06/2017   MDD (major depressive disorder), recurrent episode, moderate ([place]) 03/19/2017   Atypical squamous cell changes of undetermined significance (ASCUS) on vaginal cytology with positive high risk  human papilloma virus (HPV) 09/05/2016   Encounter for surveillance of injectable contraceptive 08/30/2016   Urinary tract infection with hematuria 06/05/2016   Bad odor of urine 06/05/2016   Irregular intermenstrual bleeding 01/18/2016   Anxiety 01/18/2016   History of organ transplantation 08/18/2015   Abscess of calf 12/21/2014   Escherichia coli (E. 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, not stated as uncontrolled 10/16/2012   Hypothyroidism 10/15/2012   Proliferative diabetic retinopathy ([place]) 09/27/2011   Past Medical History:  Diagnosis Date   Abscess of left foot 09/20/2020   Abscess of right thigh    Anemia    presently on iron supplement   Anxiety    ASCUS with positive high risk human papillomavirus of vagina 05/01/2020   04/2020 ASCUS +HPV 16 and other will get Colpo    Atypical squamous cell changes of undetermined significance (ASCUS) on vaginal cytology with positive high risk human papilloma virus (HPV) 09/05/2016   Get colpo   Chronic kidney disease    on dialysis T, Th, Sat-  2017 [place] Tansplant   CMV (cytomegalovirus infection) ([place])    Complication of anesthesia    Depression    Detached retina    Diabetes mellitus without complication ([place])    Type 1- had a pancreas transplant 2017   History of organ transplantation 08/18/2015   Kidney and Pancreates   HSV-1 (herpes simplex virus 1) infection    HSV-2 (herpes simplex virus 2) infection    Hypertension    Hypothyroidism    Irregular periods 07/05/2014   Neuropathy    in feet   Neuropathy    Osteomyelitis of left  foot ([place]) 09/20/2020   PONV (postoperative nausea and vomiting)    Seizures ([place]) 01/2014   ? due to blood sugar   Thyroid enlargement    "not on medication at this time"   Urinary tract infection    hx of   Vaginal discharge 07/05/2014   Vaginal Pap smear, abnormal    Yeast infection    took diflucan saturday     Family History  Problem Relation Age of Onset   Cancer Paternal Grandfather        prostate   Hyperlipidemia Paternal Grandfather    Stroke Paternal Grandfather     Past Surgical History:  Procedure Laterality Date   APPLICATION OF A-CELL OF EXTREMITY Right 10/03/2014   Procedure: APPLICATION OF A-CELL OF RIGHT UPPER THIGH WOUND;  Surgeon: Renette Butters, MD;  Location:  [facility];  Service: Orthopedics;  Laterality: Right;   APPLICATION OF WOUND VAC Right 10/03/2014   Procedure: APPLICATION OF WOUND VAC RIGHT THIGH;  Surgeon: Renette Butters, MD;  Location: [place];  Service: Orthopedics;  Laterality: Right;   APPLICATION OF WOUND VAC  10/25/2020   Procedure: APPLICATION OF WOUND VAC;  Surgeon: Newt Minion, MD;  Location: [place];  Service: Orthopedics;;   AV FISTULA PLACEMENT Left 09/01/2014   Procedure: BRACHIOCEPHALIC ARTERIOVENOUS (AV) FISTULA CREATION;  Surgeon: Conrad Rolla, MD;  Location: [place];  Service: Vascular;  Laterality: Left;   CHOLECYSTECTOMY     COMBINED KIDNEY-PANCREAS TRANSPLANT  05/15/15   I & D EXTREMITY Right 08/14/2014   Procedure: IRRIGATION AND DEBRIDEMENT EXTREMITY WITH MUSCLE BIOPSY;  Surgeon: Roseanne Kaufman, MD;  Location: [place];  Service: Orthopedics;  Laterality: Right;   I & D EXTREMITY Right 08/20/2014   Procedure:  I AND D LEG / THIGH ABSCESS AND MANIPULATION OF RIGHT ELBOW.;  Surgeon: Renette Butters, MD;  Location: [place];  Service: Orthopedics;  Laterality: Right;   I & D EXTREMITY Right 09/30/2014   Procedure: IRRIGATION AND DEBRIDEMENT RIGHT THIGH ABSCESS;  Surgeon: [name], MD;  Location: [place];  Service: Orthopedics;  Laterality: Right;    I & D EXTREMITY Right 10/03/2014   Procedure: IRRIGATION AND DEBRIDEMENT RIGHT THIGH ABSCESS,WITH WOUND CLOSURE & MANIPULATION OF RIGHT KNEE;  Surgeon: Renette Butters, MD;  Location: [place];  Service: Orthopedics;  Laterality: Right;   I & D EXTREMITY Left 09/19/2020   Procedure: IRRIGATION AND DEBRIDEMENT EXTREMITY;  Surgeon: Altamese Basalt, MD;  Location: [place];  Service: Orthopedics;  Laterality: Left;   I & D EXTREMITY Left 09/21/2020   Procedure: IRRIGATION AND DEBRIDEMENT EXTREMITY, partial excision bone 2nd metatarsal;  Surgeon: Altamese Bagley, MD;  Location: [place];  Service: Orthopedics;  Laterality: Left;   I & D EXTREMITY Left 10/25/2020   Procedure: LEFT FOOT DEBRIDEMENT;  Surgeon: Newt Minion, MD;  Location: [place];  Service: Orthopedics;  Laterality: Left;   INCISION AND DRAINAGE ABSCESS Right 12/09/2014   Procedure: INCISION AND DRAINAGE ABSCESS;  Surgeon: Renette Butters, MD;  Location: [place];  Service: Orthopedics;  Laterality: Right;   INSERTION OF DIALYSIS CATHETER N/A 09/01/2014   Procedure: INSERTION OF DIALYSIS CATHETER IN RIGHT INTERNAL JUGUILAR;  Surgeon: Conrad [place], MD;  Location: [place];  Service: Vascular;  Laterality: N/A;   KIDNEY TRANSPLANT  05/15/15   OPEN REDUCTION INTERNAL FIXATION (ORIF) FOOT LISFRANC FRACTURE Left 07/28/2020   Procedure: OPEN REDUCTION INTERNAL FIXATION (ORIF) FOOT LISFRANC FRACTURE;  Surgeon: Altamese [place], MD;  Location: [place];  Service: Orthopedics;  Laterality: Left;   ORIF TOE FRACTURE Left 07/28/2020   Procedure: OPEN REDUCTION INTERNAL FIXATION (ORIF) METATARSAL (TOE) FRACTURE;  Surgeon: Altamese Nittany, MD;  Location: [place];  Service: Orthopedics;  Laterality: Left;   PARS PLANA VITRECTOMY  10/01/2011   Procedure: PARS PLANA VITRECTOMY WITH 25 GAUGE;  Surgeon: Hayden Pedro, MD;  Location: [place];  Service: Ophthalmology;  Laterality: Right;  Repair Complex Traction Retinal Detachment   PERCUTANEOUS PINNING Left 07/28/2020   Procedure:  PERCUTANEOUS PINNING  2ND AND 3RD METATARSALS;  Surgeon: Altamese [place], MD;  Location: [place];  Service: Orthopedics;  Laterality: Left;   PILONIDAL CYST EXCISION     TRIGGER FINGER RELEASE Right 09/21/13   WISDOM TOOTH EXTRACTION     Social History   Occupational History    Employer: [employer]  Tobacco Use   Smoking status: Never   Smokeless tobacco: Never  Vaping Use   Vaping Use: Never used  Substance and Sexual Activity   Alcohol use: No    Alcohol/week: 0.0 standard drinks   Drug use: No   Sexual activity: Yes    Birth control/protection: Injection

## 2020-11-21 ENCOUNTER — Other Ambulatory Visit: Payer: Self-pay | Admitting: Pharmacist

## 2020-11-21 DIAGNOSIS — T847XXD Infection and inflammatory reaction due to other internal orthopedic prosthetic devices, implants and grafts, subsequent encounter: Secondary | ICD-10-CM

## 2020-11-21 DIAGNOSIS — M86172 Other acute osteomyelitis, left ankle and foot: Secondary | ICD-10-CM

## 2020-11-21 MED ORDER — CEFADROXIL 500 MG PO CAPS: 1000.0000 mg | ORAL_CAPSULE | Freq: Two times a day (BID) | ORAL | 2 refills | Status: DC

## 2020-11-21 NOTE — Progress Notes (Signed)
c 

## 2020-11-21 NOTE — Telephone Encounter (Signed)
Done. Thank you!  Dorie - do you mind calling patient to give her update? I sent cefadroxil to [facility]. She sees Dr. Linus Salmons tomorrow 10/12.

## 2020-11-21 NOTE — Telephone Encounter (Signed)
See above message - forgot to tag you in. Thank you!

## 2020-11-22 ENCOUNTER — Ambulatory Visit (INDEPENDENT_AMBULATORY_CARE_PROVIDER_SITE_OTHER): Payer: BC Managed Care – PPO | Admitting: Family

## 2020-11-22 ENCOUNTER — Other Ambulatory Visit: Payer: Self-pay

## 2020-11-22 ENCOUNTER — Encounter: Payer: Self-pay | Admitting: Internal Medicine

## 2020-11-22 ENCOUNTER — Ambulatory Visit (INDEPENDENT_AMBULATORY_CARE_PROVIDER_SITE_OTHER): Payer: BC Managed Care – PPO | Admitting: Internal Medicine

## 2020-11-22 VITALS — BP 162/94 | HR 82 | Resp 16 | Ht 62.0 in | Wt 169.0 lb

## 2020-11-22 DIAGNOSIS — M86172 Other acute osteomyelitis, left ankle and foot: Secondary | ICD-10-CM

## 2020-11-22 DIAGNOSIS — L02612 Cutaneous abscess of left foot: Secondary | ICD-10-CM | POA: Diagnosis not present

## 2020-11-22 DIAGNOSIS — Z949 Transplanted organ and tissue status, unspecified: Secondary | ICD-10-CM

## 2020-11-22 DIAGNOSIS — Z95828 Presence of other vascular implants and grafts: Secondary | ICD-10-CM | POA: Diagnosis not present

## 2020-11-22 DIAGNOSIS — T847XXD Infection and inflammatory reaction due to other internal orthopedic prosthetic devices, implants and grafts, subsequent encounter: Secondary | ICD-10-CM

## 2020-11-22 NOTE — Assessment & Plan Note (Signed)
Port in place and will have home health deaccess port until needed for her infusions again.

## 2020-11-22 NOTE — Telephone Encounter (Addendum)
Patient called stating her Orthopedic office was able to de-access her port today at her appointment.  [name] with [name] also advised that patient's port was de-accessed today. Tracy [name] T Brooks Sailors

## 2020-11-22 NOTE — Progress Notes (Signed)
Post-Op Visit Note   Patient: Tracy [name]           Date of Birth: [birth year]           MRN: [id number] Visit Date: 11/22/2020 PCP: Patient, No Pcp Per (Inactive)  Chief Complaint:  Chief Complaint  Patient presents with   Left Foot - Follow-up    HPI:  HPI The patient is a 35 year old woman who presents in routine follow-up status post left foot debridement.  She did have a hardware complicating wound infection of the left foot she is being followed with infectious diseases.  She was seen this morning they have stopped her IV antibiotics and she will be beginning taking oral antibiotics tomorrow.  She has discontinued her cam walker and has gotten new regular shoewear she has been attempting to get back to her regular activities of daily living.  She states she has been having difficulties with swelling and pain she feels she is not walking "normally"  Ortho Exam On examination of the left foot the incision is well-healed there is no gaping or drainage no open area there is very mild erythema there is no cellulitis.  Does have some mild edema of the foot and ankle  Visit Diagnoses:  1. Hardware complicating wound infection, subsequent encounter     Plan: We will order physical therapy.  We will follow-up with her in 5 weeks with Dr. Sharol Given.  Discussed advancing activities as she tolerates.  Continue to feel reassured.  Radiographs of left foot follow-up  Follow-Up Instructions: No follow-ups on file.   Imaging: No results found.  Orders:  Orders Placed This Encounter  Procedures   Ambulatory referral to Physical Therapy   No orders of the defined types were placed in this encounter.    PMFS History: Patient Active Problem List   Diagnosis Date Noted   Pyogenic inflammation of bone ([place]) 0000000   Hardware complicating wound infection ([place]) 10/04/2020   Medication monitoring encounter 10/04/2020   Abscess of left foot 09/20/2020   Osteomyelitis of foot,  left, acute ([place]) 09/20/2020   Cellulitis of left foot 09/19/2020   ASCUS with positive high risk human papillomavirus of vagina 05/01/2020   Abnormal uterine bleeding (AUB) 04/19/2020   History of abnormal cervical Pap smear 10/06/2017   Encounter for gynecological examination with Papanicolaou smear of cervix 10/06/2017   MDD (major depressive disorder), recurrent episode, moderate ([place]) 03/19/2017   Atypical squamous cell changes of undetermined significance (ASCUS) on vaginal cytology with positive high risk human papilloma virus (HPV) 09/05/2016   Encounter for surveillance of injectable contraceptive 08/30/2016   Urinary tract infection with hematuria 06/05/2016   Bad odor of urine 06/05/2016   Irregular intermenstrual bleeding 01/18/2016   Anxiety 01/18/2016   History of organ transplantation 08/18/2015   Abscess of calf 12/21/2014   Escherichia coli (E. 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, not stated as uncontrolled 10/16/2012   Hypothyroidism 10/15/2012   Proliferative diabetic retinopathy ([place]) 09/27/2011   Past Medical History:  Diagnosis Date   Abscess of left foot 09/20/2020   Abscess of right thigh    Anemia  presently on iron supplement   Anxiety    ASCUS with positive high risk human papillomavirus of vagina 05/01/2020   04/2020 ASCUS +HPV 16 and other will get Col[place]    Atypical squamous cell changes of undetermined significance (ASCUS) on vaginal cytology with positive high risk human papilloma virus (HPV) 09/05/2016   Get colpo   Chronic kidney disease    on dialysis T, Th,  Sat-  2017 [place] Tansplant   CMV (cytomegalovirus infection) ([place])    Complication of anesthesia    Depression    Detached retina    Diabetes mellitus without complication ([place])    Type 1- had a pancreas transplant 2017   History of organ transplantation 08/18/2015   Kidney and Pancreates   HSV-1 (herpes simplex virus 1) infection    HSV-2 (herpes simplex virus 2) infection    Hypertension    Hypothyroidism    Irregular periods 07/05/2014   Neuropathy    in feet   Neuropathy    Osteomyelitis of left foot ([place]) 09/20/2020   PONV (postoperative nausea and vomiting)    Seizures ([place]) 01/2014   ? due to blood sugar   Thyroid enlargement    "not on medication at this time"   Urinary tract infection    hx of   Vaginal discharge 07/05/2014   Vaginal Pap smear, abnormal    Yeast infection    took diflucan saturday     Family History  Problem Relation Age of Onset   Cancer Paternal Grandfather        prostate   Hyperlipidemia Paternal Grandfather    Stroke Paternal Grandfather     Past Surgical History:  Procedure Laterality Date   APPLICATION OF A-CELL OF EXTREMITY Right 10/03/2014   Procedure: APPLICATION OF A-CELL OF RIGHT UPPER THIGH WOUND;  Surgeon: Renette Butters, MD;  Location: [facility];  Service: Orthopedics;  Laterality: Right;   APPLICATION OF WOUND VAC Right 10/03/2014   Procedure: APPLICATION OF WOUND VAC RIGHT THIGH;  Surgeon: Renette Butters, MD;  Location: [facility];  Service: Orthopedics;  Laterality: Right;   APPLICATION OF WOUND VAC  10/25/2020   Procedure: APPLICATION OF WOUND VAC;  Surgeon: Newt Minion, MD;  Location: [facility];  Service: Orthopedics;;   AV FISTULA PLACEMENT Left 09/01/2014   Procedure: BRACHIOCEPHALIC ARTERIOVENOUS (AV) FISTULA CREATION;  Surgeon: Conrad [place], MD;  Location: [facility];  Service: Vascular;  Laterality: Left;   CHOLECYSTECTOMY     COMBINED KIDNEY-PANCREAS TRANSPLANT  05/15/15   I & D EXTREMITY Right 08/14/2014   Procedure: IRRIGATION AND  DEBRIDEMENT EXTREMITY WITH MUSCLE BIOPSY;  Surgeon: Roseanne Kaufman, MD;  Location: [facility];  Service: Orthopedics;  Laterality: Right;   I & D EXTREMITY Right 08/20/2014   Procedure:  I AND D LEG / THIGH ABSCESS AND MANIPULATION OF RIGHT ELBOW.;  Surgeon: Renette Butters, MD;  Location: [facility] [place];  Service: Orthopedics;  Laterality: Right;   I & D EXTREMITY Right 09/30/2014   Procedure: IRRIGATION AND DEBRIDEMENT RIGHT THIGH ABSCESS;  Surgeon: [name], MD;  Location: [facility];  Service: Orthopedics;  Laterality: Right;   I & D EXTREMITY Right 10/03/2014   Procedure: IRRIGATION AND DEBRIDEMENT RIGHT THIGH ABSCESS,WITH WOUND CLOSURE & MANIPULATION OF RIGHT KNEE;  Surgeon: Renette Butters, MD;  Location: [facility];  Service: Orthopedics;  Laterality: Right;   I & D EXTREMITY Left 09/19/2020   Procedure: IRRIGATION AND DEBRIDEMENT EXTREMITY;  Surgeon: Altamese [place], MD;  Location: [facility];  Service: Orthopedics;  Laterality: Left;   I & D EXTREMITY Left 09/21/2020   Procedure: IRRIGATION AND DEBRIDEMENT EXTREMITY, partial excision bone 2nd metatarsal;  Surgeon: Altamese [place], MD;  Location: [facility];  Service: Orthopedics;  Laterality: Left;   I & D EXTREMITY Left 10/25/2020   Procedure: LEFT FOOT DEBRIDEMENT;  Surgeon: Newt Minion, MD;  Location: [facility];  Service: Orthopedics;  Laterality: Left;   INCISION AND DRAINAGE ABSCESS Right 12/09/2014   Procedure: INCISION AND DRAINAGE ABSCESS;  Surgeon: Renette Butters, MD;  Location: [facility];  Service: Orthopedics;  Laterality: Right;   INSERTION OF DIALYSIS CATHETER N/A 09/01/2014   Procedure: INSERTION OF DIALYSIS CATHETER IN RIGHT INTERNAL JUGUILAR;  Surgeon: Conrad [place], MD;  Location: [facility];  Service: Vascular;  Laterality: N/A;   KIDNEY TRANSPLANT  05/15/15   OPEN REDUCTION INTERNAL FIXATION (ORIF) FOOT LISFRANC FRACTURE Left 07/28/2020   Procedure: OPEN REDUCTION INTERNAL FIXATION (ORIF) FOOT LISFRANC FRACTURE;  Surgeon: Altamese [place], MD;  Location: [facility];   Service: Orthopedics;  Laterality: Left;   ORIF TOE FRACTURE Left 07/28/2020   Procedure: OPEN REDUCTION INTERNAL FIXATION (ORIF) METATARSAL (TOE) FRACTURE;  Surgeon: Altamese [place], MD;  Location: [facility];  Service: Orthopedics;  Laterality: Left;   PARS PLANA VITRECTOMY  10/01/2011   Procedure: PARS PLANA VITRECTOMY WITH 25 GAUGE;  Surgeon: Hayden Pedro, MD;  Location: [facility];  Service: Ophthalmology;  Laterality: Right;  Repair Complex Traction Retinal Detachment   PERCUTANEOUS PINNING Left 07/28/2020   Procedure: PERCUTANEOUS PINNING  2ND AND 3RD METATARSALS;  Surgeon: Altamese [place], MD;  Location: [facility];  Service: Orthopedics;  Laterality: Left;   PILONIDAL CYST EXCISION     TRIGGER FINGER RELEASE Right 09/21/13   WISDOM TOOTH EXTRACTION     Social History   Occupational History    Employer: [employer]  Tobacco Use   Smoking status: Never   Smokeless tobacco: Never  Vaping Use   Vaping Use: Never used  Substance and Sexual Activity   Alcohol use: No    Alcohol/week: 0.0 standard drinks   Drug use: No   Sexual activity: Yes    Birth control/protection: Injection

## 2020-11-22 NOTE — Assessment & Plan Note (Signed)
She continues on oral immunosuppressive medications.

## 2020-11-22 NOTE — Progress Notes (Signed)
   Subjective:    Patient ID: Tracy [name], female    DOB: [birth year], 35 y.o.   MRN: [id number]  HPI Here for hsfu She has a history of type 1 diabetes, HTN, s/p kidney and pancrease transplant in 2017 at [facility] who sustained a left tarsometatarsal dislocation and a first to fifth metatarsal fractures s/p ORIF following a fall from a horse and subsequent infection in June 2022.   In August 2022 she developed a foot abscess and debrided by Dr. Marcelino [name] 09/19/20 and again on 8/13 and treated for MSSA hardware-associated osteomyelitis with IV cefazolin.  She was then seen by Dr. Sharol [name] and underwent further debridement with ongoing abscess and antibiotics extended through today.  She has been getting her antibiotics via her port she had been using for [facility] infusions, which is on hold at this time and she is just taking oral immunosuppressives.  She is having no diarrhea, no rash on cefazolin.     Review of Systems  Constitutional:  Negative for chills, fatigue and fever.  Gastrointestinal:  Negative for diarrhea and nausea.  Skin:  Negative for rash.      Objective:   Physical Exam Eyes:     General: No scleral icterus. Pulmonary:     Effort: Pulmonary effort is normal.  Musculoskeletal:     Comments: Left foot with some edema but no warmth, closed incision, no pain.    Neurological:     Mental Status: She is alert.   SH: no tobacco       Assessment & Plan:

## 2020-11-22 NOTE — Assessment & Plan Note (Signed)
No signs of ongoing abscess now at this time, incision area healed.

## 2020-11-22 NOTE — Assessment & Plan Note (Signed)
Seems to be improved now finally, after prolonged IV antibiotics and mulitple surgeries.  No significant findings on exam presently.   At this point, with hardware in place, I will have her continue with oral cefadroxil 1 gram twice a day for 3-6 months.  If there is a plan to remove the hardware at some point once the bone is healed, could consider ongoing antibiotic suppression until that time.   Will have her return in about 1 month to recheck her ESR and CRP.

## 2020-11-23 ENCOUNTER — Ambulatory Visit (HOSPITAL_COMMUNITY): Payer: BC Managed Care – PPO | Attending: Family | Admitting: Physical Therapy

## 2020-11-23 DIAGNOSIS — M25572 Pain in left ankle and joints of left foot: Secondary | ICD-10-CM | POA: Diagnosis not present

## 2020-11-23 DIAGNOSIS — M25672 Stiffness of left ankle, not elsewhere classified: Secondary | ICD-10-CM | POA: Insufficient documentation

## 2020-11-23 DIAGNOSIS — R262 Difficulty in walking, not elsewhere classified: Secondary | ICD-10-CM | POA: Diagnosis present

## 2020-11-23 DIAGNOSIS — T847XXD Infection and inflammatory reaction due to other internal orthopedic prosthetic devices, implants and grafts, subsequent encounter: Secondary | ICD-10-CM | POA: Diagnosis not present

## 2020-11-23 NOTE — Therapy (Signed)
[place], [place], [address] Phone: [phone]   Fax:  [phone]  Physical Therapy Evaluation  Patient Details  Name: Tracy [name] MRN: [id number] Date of Birth: [birth year] Referring Provider (PT): Dondra Prader   Encounter Date: 11/23/2020   PT End of Session - 11/23/20 0935     Visit Number 1    Number of Visits 16    Date for PT Re-Evaluation 01/04/21    Authorization Type [employer]    Authorization - Visit Number 1    Authorization - Number of Visits 30    Progress Note Due on Visit 10    PT Start Time 0835    PT Stop Time 0925    PT Time Calculation (min) 50 min    Activity Tolerance Patient tolerated treatment well             Past Medical History:  Diagnosis Date   Abscess of left foot 09/20/2020   Abscess of right thigh    Anemia    presently on iron supplement   Anxiety    ASCUS with positive high risk human papillomavirus of vagina 05/01/2020   04/2020 ASCUS +HPV 16 and other will get Colpo    Atypical squamous cell changes of undetermined significance (ASCUS) on vaginal cytology with positive high risk human papilloma virus (HPV) 09/05/2016   Get colpo   Chronic kidney disease    on dialysis T, Th, Sat-  2017 [place] Tansplant   CMV (cytomegalovirus infection) ([place])    Complication of anesthesia    Depression    Detached retina    Diabetes mellitus without complication ([place])    Type 1- had a pancreas transplant 2017   History of organ transplantation 08/18/2015   Kidney and Pancreates   HSV-1 (herpes simplex virus 1) infection    HSV-2 (herpes simplex virus 2) infection    Hypertension    Hypothyroidism    Irregular periods 07/05/2014   Neuropathy    in feet   Neuropathy    Osteomyelitis of left foot ([place]) 09/20/2020   PONV (postoperative nausea and vomiting)    Seizures ([place]) 01/2014   ? due to blood sugar   Thyroid enlargement    "not on medication at this time"   Urinary  tract infection    hx of   Vaginal discharge 07/05/2014   Vaginal Pap smear, abnormal    Yeast infection    took diflucan saturday     Past Surgical History:  Procedure Laterality Date   APPLICATION OF A-CELL OF EXTREMITY Right 10/03/2014   Procedure: APPLICATION OF A-CELL OF RIGHT UPPER THIGH WOUND;  Surgeon: Renette Butters, MD;  Location: [facility];  Service: Orthopedics;  Laterality: Right;   APPLICATION OF WOUND VAC Right 10/03/2014   Procedure: APPLICATION OF WOUND VAC RIGHT THIGH;  Surgeon: Renette Butters, MD;  Location: [facility];  Service: Orthopedics;  Laterality: Right;   APPLICATION OF WOUND VAC  10/25/2020   Procedure: APPLICATION OF WOUND VAC;  Surgeon: Newt Minion, MD;  Location: [facility];  Service: Orthopedics;;   AV FISTULA PLACEMENT Left 09/01/2014   Procedure: BRACHIOCEPHALIC ARTERIOVENOUS (AV) FISTULA CREATION;  Surgeon: Conrad Horatio, MD;  Location: [facility];  Service: Vascular;  Laterality: Left;   CHOLECYSTECTOMY     COMBINED KIDNEY-PANCREAS TRANSPLANT  05/15/15   I & D EXTREMITY Right 08/14/2014   Procedure: IRRIGATION AND DEBRIDEMENT EXTREMITY WITH MUSCLE BIOPSY;  Surgeon: Roseanne Kaufman, MD;  Location: [facility];  Service: Orthopedics;  Laterality: Right;   I & D EXTREMITY Right 08/20/2014   Procedure:  I AND D LEG / THIGH ABSCESS AND MANIPULATION OF RIGHT ELBOW.;  Surgeon: Renette Butters, MD;  Location: [facility];  Service: Orthopedics;  Laterality: Right;   I & D EXTREMITY Right 09/30/2014   Procedure: IRRIGATION AND DEBRIDEMENT RIGHT THIGH ABSCESS;  Surgeon: [name], MD;  Location: [facility];  Service: Orthopedics;  Laterality: Right;   I & D EXTREMITY Right 10/03/2014   Procedure: IRRIGATION AND DEBRIDEMENT RIGHT THIGH ABSCESS,WITH WOUND CLOSURE & MANIPULATION OF RIGHT KNEE;  Surgeon: Renette Butters, MD;  Location: [facility];  Service: Orthopedics;  Laterality: Right;   I & D EXTREMITY Left 09/19/2020   Procedure: IRRIGATION AND DEBRIDEMENT EXTREMITY;  Surgeon: Altamese [place], MD;   Location: [facility];  Service: Orthopedics;  Laterality: Left;   I & D EXTREMITY Left 09/21/2020   Procedure: IRRIGATION AND DEBRIDEMENT EXTREMITY, partial excision bone 2nd metatarsal;  Surgeon: Altamese [place], MD;  Location: [facility];  Service: Orthopedics;  Laterality: Left;   I & D EXTREMITY Left 10/25/2020   Procedure: LEFT FOOT DEBRIDEMENT;  Surgeon: Newt Minion, MD;  Location: [facility];  Service: Orthopedics;  Laterality: Left;   INCISION AND DRAINAGE ABSCESS Right 12/09/2014   Procedure: INCISION AND DRAINAGE ABSCESS;  Surgeon: Renette Butters, MD;  Location: [facility];  Service: Orthopedics;  Laterality: Right;   INSERTION OF DIALYSIS CATHETER N/A 09/01/2014   Procedure: INSERTION OF DIALYSIS CATHETER IN RIGHT INTERNAL JUGUILAR;  Surgeon: Conrad Pingree, MD;  Location: [facility];  Service: Vascular;  Laterality: N/A;   KIDNEY TRANSPLANT  05/15/15   OPEN REDUCTION INTERNAL FIXATION (ORIF) FOOT LISFRANC FRACTURE Left 07/28/2020   Procedure: OPEN REDUCTION INTERNAL FIXATION (ORIF) FOOT LISFRANC FRACTURE;  Surgeon: Altamese [place], MD;  Location: [facility];  Service: Orthopedics;  Laterality: Left;   ORIF TOE FRACTURE Left 07/28/2020   Procedure: OPEN REDUCTION INTERNAL FIXATION (ORIF) METATARSAL (TOE) FRACTURE;  Surgeon: Altamese [place], MD;  Location: [facility];  Service: Orthopedics;  Laterality: Left;   PARS PLANA VITRECTOMY  10/01/2011   Procedure: PARS PLANA VITRECTOMY WITH 25 GAUGE;  Surgeon: Hayden Pedro, MD;  Location: [facility];  Service: Ophthalmology;  Laterality: Right;  Repair Complex Traction Retinal Detachment   PERCUTANEOUS PINNING Left 07/28/2020   Procedure: PERCUTANEOUS PINNING  2ND AND 3RD METATARSALS;  Surgeon: Altamese [place], MD;  Location: [facility];  Service: Orthopedics;  Laterality: Left;   PILONIDAL CYST EXCISION     TRIGGER FINGER RELEASE Right 09/21/13   WISDOM TOOTH EXTRACTION      There were no vitals filed for this visit.    Subjective Assessment - 11/23/20 0843     Subjective PT fell off  her horse on 6/3 fx 4 metatarsals 2 in 2 different areas.  The Pt states that her strength seems to be the biggest issue in her ankle at this time.  She has weaned herself away from her camboot and her walker. She is currently walking with one crutch. 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    Pertinent History fx of multiple bones on LT foot on 6/3 ; with ORIF on 6/17 due to severe swelling. PT  needed 3 I&D with IV and oral antibiotics, pt on immunosuppressants for kidney and pancreas transplant,    Limitations Walking;Standing;House hold activities    How long can you sit comfortably? no problem    How  long can you stand comfortably? 20-30 mintues    How long can you walk comfortably? 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 Yes    Pain Score 2    worst 4/10; best 0   Pain Location Ankle    Pain Orientation Left;Anterior    Pain Descriptors / Indicators Burning    Pain Type Acute pain    Pain Radiating Towards to her toes    Pain Onset More than a month ago    Pain Frequency Intermittent    Aggravating Factors  WB    Pain Relieving Factors rest    Effect of Pain on Daily Activities limits                [facility] PT Assessment - 11/23/20 0001       Assessment   Medical Diagnosis s/P ORIF of Lt foot with complication of osteomyolitis with 3 I&D    Referring Provider (PT) Dondra Prader    Onset Date/Surgical Date 10/25/20   6/17 initial ORIF, I&D: 8/9; 8/13/ 9/14   Next MD Visit 12/21/2020    Prior Therapy IP      Precautions   Precautions --   infection     Balance Screen   Has the patient fallen in the past 6 months --   off a horse.   Has the patient had a decrease in activity level because of a fear of falling?  Yes    Is the patient reluctant to leave their home because of a fear of falling?  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      Cognition   Overall Cognitive Status Within Functional Limits for tasks assessed      Observation/Other Assessments   Focus on Therapeutic Outcomes (FOTO)  55; 45 % affected      ROM / Strength   AROM / PROM / Strength AROM;Strength      AROM   AROM Assessment Site Hip;Knee;Ankle    Right/Left Hip Left    Right/Left Knee Left    Right/Left Ankle Left    Left Ankle Dorsiflexion 2    Left Ankle Plantar Flexion 30    Left Ankle Inversion 15    Left Ankle Eversion 8      Strength   Strength Assessment Site Ankle;Hip;Knee    Right/Left Hip Left    Left Hip Flexion 4+/5    Left Hip Extension 5/5    Left Hip ABduction 4-/5    Right/Left Knee Left    Left Knee Extension 4+/5    Right/Left Ankle Left    Left Ankle Dorsiflexion 3/5    Left Ankle Plantar Flexion 2+/5    Left Ankle Inversion 3/5    Left Ankle Eversion 3-/5      Ambulation/Gait   Ambulation Distance (Feet)  175 Feet    Assistive device Crutches   1   Gait Pattern Decreased dorsiflexion - left;Decreased dorsiflexion - right    Gait Comments 2 MWT                        Objective measurements completed on examination: See above findings.       [facility] Adult PT Treatment/Exercise - 11/23/20 0001       Exercises   Exercises Ankle      Ankle Exercises: Seated   Towel Crunch 1 rep    Heel Raises Left;10 reps    Toe Raise 10 reps    Other Seated Ankle Exercises toe spread, Lt great toe extension, lesser toe extension x 10      Ankle Exercises: Supine   Other Supine Ankle Exercises  ankle pump for ROM                     PT Education - 11/23/20 0934     Education Details Proper use of crutch, exercises    Person(s) Educated Patient    Methods Explanation;Demonstration;Handout    Comprehension Verbalized understanding;Returned demonstration              PT Short Term Goals - 11/23/20 0947       PT SHORT TERM GOAL #1   Title PT to be I in HEP to allow improved ROM    Time 3    Period Weeks    Status New    Target Date 12/14/20      PT SHORT TERM GOAL #2   Title PT strength to improve one grade in her ankle to allow pt to feel confident walking without any assistive device.    Time 3    Status New      PT SHORT TERM GOAL #3   Title PT activity tolerance to increase to allow pt to complete 2 hour of standing/walking actiivity to allow pt to complete shopping and housework activity without having to take a break.    Time 3    Period Weeks    Status New               PT Long Term Goals - 11/23/20 0950       PT LONG TERM GOAL #1   Title Pt to be I in advance HEP to allow normal ROM to allow a normal gait pattern.    Time 6    Period Weeks    Status New    Target Date 01/04/21      PT LONG TERM GOAL #2   Title Pt strength in Lt ankle to be at least a 4+/5 to be able to go up and down 12 steps in a reciprocal manner without a handrail    Time 6    Period Weeks    Status New      PT LONG TERM GOAL #3   Title Pt pain level after a full days activity to be no more than a 1/10 to allow to sleep without difficulty.    Time 6    Period Weeks    Status New      PT LONG TERM GOAL #4   Title PT mobility to improve to allow pt to get on her horse using her Lt LE to hoist her over the saddle.    Time 6    Period Weeks  Plan - 11/23/20 0937     Clinical Impression Statement Ms. Garant fell off her horse on 07/14/2020.  She fx 4 metatarsals in her Lt foot two of which were fx in two places.  There was to  much swelling to do surgery immediately.  Surgery was completed on 6/17.  She required and I&D on 8/9 due to osteo myolitis;  a second on 8/13 where there was partial excision of the 3rd MT head and a third I&D on 9/14.  She is now being referred to skilled PT to improve her functioning abiliy.  Evaluation demonstrates decreased activity tolerance, decreased strength, decreased ROM, increased edema, increased ROM and increased as well as gait disturbance.  Ms. Daves will benefit from skilled PT to address these issues and maximize her functional ability.    Personal Factors and Comorbidities Comorbidity 2    Comorbidities hx of osteomylitis, pt has had kidney and pancreas transplant is on immunosuppressants.    Examination-Activity Limitations Caring for Others;Carry;Lift;Locomotion Level;Stand;Stairs;Squat    Examination-Participation Restrictions Cleaning;Community Activity;Shop;Yard Work;Occupation    Stability/Clinical Decision Making Evolving/Moderate complexity    Clinical Decision Making Moderate    Rehab Potential Good    PT Frequency 2x / week    PT Duration 6 weeks    PT Treatment/Interventions Gait training;Stair training;Therapeutic exercise;Balance training;Patient/family education;Scar mobilization;Passive range of motion;Manual techniques    PT Next Visit Plan begin manual for edema, decongestive lymphe techniques working with a lymph therapist, begin isometrics, sitting heellide to improve ROM and baps. Continue to progress to standing activity as able    PT Home Exercise Plan Exercises  Exercises Ankle  Ankle Exercises: Seated  Towel Crunch 1 rep  Heel Raises Left;10 reps  Toe Raise 10 reps  Other Seated Ankle Exercises toe spread, Lt great toe extension, lesser toe extension x 10  Ankle Exercises: Supine  Other Supine Ankle Exercises ankle pump for ROM             Patient will benefit from skilled therapeutic intervention in order to improve the following deficits and  impairments:  Abnormal gait, Decreased activity tolerance, Decreased balance, Decreased range of motion, Decreased scar mobility, Decreased strength, Difficulty walking, Increased edema, Increased fascial restricitons, Pain  Visit Diagnosis: Pain in left ankle and joints of left foot - Plan: PT plan of care cert/re-cert  Stiffness of left ankle, not elsewhere classified - Plan: PT plan of care cert/re-cert  Difficulty in walking, not elsewhere classified - Plan: PT plan of care cert/re-cert     Problem List Patient Active Problem List   Diagnosis Date Noted   Port-A-Cath in place 11/22/2020   Pyogenic inflammation of bone ([place]) 0000000   Hardware complicating wound infection ([place]) 10/04/2020   Medication monitoring encounter 10/04/2020   Abscess of left foot 09/20/2020   Osteomyelitis of foot, left, acute ([place]) 09/20/2020   Cellulitis of left foot 09/19/2020   ASCUS with positive high risk human papillomavirus of vagina 05/01/2020   Abnormal uterine bleeding (AUB) 04/19/2020   History of abnormal cervical Pap smear 10/06/2017   Encounter for gynecological examination with Papanicolaou smear of cervix 10/06/2017   MDD (major depressive disorder), recurrent episode, moderate ([place]) 03/19/2017   Atypical squamous cell changes of undetermined significance (ASCUS) on vaginal cytology with positive high risk human papilloma virus (HPV) 09/05/2016   Encounter for surveillance of injectable contraceptive 08/30/2016   Urinary tract infection with hematuria 06/05/2016   Bad odor of urine 06/05/2016   Irregular intermenstrual bleeding 01/18/2016  Anxiety 01/18/2016   History of organ transplantation 08/18/2015   Abscess of calf 12/21/2014   Escherichia coli (E. 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

## 2020-11-28 ENCOUNTER — Ambulatory Visit (HOSPITAL_COMMUNITY): Payer: BC Managed Care – PPO | Admitting: Physical Therapy

## 2020-11-28 ENCOUNTER — Other Ambulatory Visit: Payer: Self-pay

## 2020-11-28 DIAGNOSIS — M25572 Pain in left ankle and joints of left foot: Secondary | ICD-10-CM

## 2020-11-28 DIAGNOSIS — M25672 Stiffness of left ankle, not elsewhere classified: Secondary | ICD-10-CM

## 2020-11-28 DIAGNOSIS — R262 Difficulty in walking, not elsewhere classified: Secondary | ICD-10-CM

## 2020-11-28 NOTE — Therapy (Signed)
[facility] [place] [place], [place], [address] Phone: [phone]   Fax:  [phone]  Physical Therapy Treatment  Patient Details  Name: Tracy [name] MRN: [id number] Date of Birth: 1985/05/15 Referring Provider (PT): Dondra Prader   Encounter Date: 11/28/2020   PT End of Session - 11/28/20 1010     Visit Number 2    Number of Visits 16    Date for PT Re-Evaluation 01/04/21    Authorization Type [employer]    Authorization - Visit Number 2    Authorization - Number of Visits 30    Progress Note Due on Visit 10    PT Start Time 0830    PT Stop Time 0915    PT Time Calculation (min) 45 min    Activity Tolerance Patient tolerated treatment well             Past Medical History:  Diagnosis Date   Abscess of left foot 09/20/2020   Abscess of right thigh    Anemia    presently on iron supplement   Anxiety    ASCUS with positive high risk human papillomavirus of vagina 05/01/2020   04/2020 ASCUS +HPV 16 and other will get Colpo    Atypical squamous cell changes of undetermined significance (ASCUS) on vaginal cytology with positive high risk human papilloma virus (HPV) 09/05/2016   Get colpo   Chronic kidney disease    on dialysis T, Th, Sat-  2017 [place] Tansplant   CMV (cytomegalovirus infection) ([place])    Complication of anesthesia    Depression    Detached retina    Diabetes mellitus without complication ([place])    Type 1- had a pancreas transplant 2017   History of organ transplantation 08/18/2015   Kidney and Pancreates   HSV-1 (herpes simplex virus 1) infection    HSV-2 (herpes simplex virus 2) infection    Hypertension    Hypothyroidism    Irregular periods 07/05/2014   Neuropathy    in feet   Neuropathy    Osteomyelitis of left foot ([place]) 09/20/2020   PONV (postoperative nausea and vomiting)    Seizures ([place]) 01/2014   ? due to blood sugar   Thyroid enlargement    "not on medication at this time"   Urinary  tract infection    hx of   Vaginal discharge 07/05/2014   Vaginal Pap smear, abnormal    Yeast infection    took diflucan saturday     Past Surgical History:  Procedure Laterality Date   APPLICATION OF A-CELL OF EXTREMITY Right 10/03/2014   Procedure: APPLICATION OF A-CELL OF RIGHT UPPER THIGH WOUND;  Surgeon: Renette Butters, MD;  Location: [facility];  Service: Orthopedics;  Laterality: Right;   APPLICATION OF WOUND VAC Right 10/03/2014   Procedure: APPLICATION OF WOUND VAC RIGHT THIGH;  Surgeon: Renette Butters, MD;  Location: [facility];  Service: Orthopedics;  Laterality: Right;   APPLICATION OF WOUND VAC  10/25/2020   Procedure: APPLICATION OF WOUND VAC;  Surgeon: Newt Minion, MD;  Location: [facility];  Service: Orthopedics;;   AV FISTULA PLACEMENT Left 09/01/2014   Procedure: BRACHIOCEPHALIC ARTERIOVENOUS (AV) FISTULA CREATION;  Surgeon: Conrad New Cambria, MD;  Location: [facility];  Service: Vascular;  Laterality: Left;   CHOLECYSTECTOMY     COMBINED KIDNEY-PANCREAS TRANSPLANT  05/15/15   I & D EXTREMITY Right 08/14/2014   Procedure: IRRIGATION AND DEBRIDEMENT EXTREMITY WITH MUSCLE BIOPSY;  Surgeon: Roseanne Kaufman, MD;  Location: [facility];  Service: Orthopedics;  Laterality: Right;   I & D EXTREMITY Right 08/20/2014   Procedure:  I AND D LEG / THIGH ABSCESS AND MANIPULATION OF RIGHT ELBOW.;  Surgeon: Renette Butters, MD;  Location: [facility];  Service: Orthopedics;  Laterality: Right;   I & D EXTREMITY Right 09/30/2014   Procedure: IRRIGATION AND DEBRIDEMENT RIGHT THIGH ABSCESS;  Surgeon: [name], MD;  Location: [facility];  Service: Orthopedics;  Laterality: Right;   I & D EXTREMITY Right 10/03/2014   Procedure: IRRIGATION AND DEBRIDEMENT RIGHT THIGH ABSCESS,WITH WOUND CLOSURE & MANIPULATION OF RIGHT KNEE;  Surgeon: Renette Butters, MD;  Location: [facility];  Service: Orthopedics;  Laterality: Right;   I & D EXTREMITY Left 09/19/2020   Procedure: IRRIGATION AND DEBRIDEMENT EXTREMITY;  Surgeon: Altamese Portia, MD;   Location: [facility];  Service: Orthopedics;  Laterality: Left;   I & D EXTREMITY Left 09/21/2020   Procedure: IRRIGATION AND DEBRIDEMENT EXTREMITY, partial excision bone 2nd metatarsal;  Surgeon: Altamese Rockford, MD;  Location: [facility];  Service: Orthopedics;  Laterality: Left;   I & D EXTREMITY Left 10/25/2020   Procedure: LEFT FOOT DEBRIDEMENT;  Surgeon: Newt Minion, MD;  Location: [facility];  Service: Orthopedics;  Laterality: Left;   INCISION AND DRAINAGE ABSCESS Right 12/09/2014   Procedure: INCISION AND DRAINAGE ABSCESS;  Surgeon: Renette Butters, MD;  Location: [facility];  Service: Orthopedics;  Laterality: Right;   INSERTION OF DIALYSIS CATHETER N/A 09/01/2014   Procedure: INSERTION OF DIALYSIS CATHETER IN RIGHT INTERNAL JUGUILAR;  Surgeon: Conrad Anniston, MD;  Location: [facility];  Service: Vascular;  Laterality: N/A;   KIDNEY TRANSPLANT  05/15/15   OPEN REDUCTION INTERNAL FIXATION (ORIF) FOOT LISFRANC FRACTURE Left 07/28/2020   Procedure: OPEN REDUCTION INTERNAL FIXATION (ORIF) FOOT LISFRANC FRACTURE;  Surgeon: Altamese Niwot, MD;  Location: [facility];  Service: Orthopedics;  Laterality: Left;   ORIF TOE FRACTURE Left 07/28/2020   Procedure: OPEN REDUCTION INTERNAL FIXATION (ORIF) METATARSAL (TOE) FRACTURE;  Surgeon: Altamese Rockwell, MD;  Location: [facility];  Service: Orthopedics;  Laterality: Left;   PARS PLANA VITRECTOMY  10/01/2011   Procedure: PARS PLANA VITRECTOMY WITH 25 GAUGE;  Surgeon: Hayden Pedro, MD;  Location: [facility];  Service: Ophthalmology;  Laterality: Right;  Repair Complex Traction Retinal Detachment   PERCUTANEOUS PINNING Left 07/28/2020   Procedure: PERCUTANEOUS PINNING  2ND AND 3RD METATARSALS;  Surgeon: Altamese Glenrock, MD;  Location: [facility];  Service: Orthopedics;  Laterality: Left;   PILONIDAL CYST EXCISION     TRIGGER FINGER RELEASE Right 09/21/13   WISDOM TOOTH EXTRACTION      There were no vitals filed for this visit.   Subjective Assessment - 11/28/20 0901     Subjective PT states she's  no pain just some soreness from all the new activities/exercises.  Started taking the [place] to help this.  Comes today wearing tennis shoes and without AD.    Currently in Pain? No/denies                               [facility] Adult PT Treatment/Exercise - 11/28/20 0001       Manual Therapy   Manual Therapy Edema management;Passive ROM    Manual therapy comments completed at EOS seperate from all other skilled interventions    Edema Management LE's elevated, retro massage    Passive ROM gentle to toes, forefoot to improve ROM  Ankle Exercises: Seated   Towel Crunch 1 rep    Towel Inversion/Eversion 5 reps;Weights   3# weight on end of towel   Heel Raises Left;10 reps    Toe Raise 10 reps    BAPS Level 2;10 reps    Other Seated Ankle Exercises toe spread, Lt great toe extension, lesser toe extension x 10                     PT Education - 11/28/20 1020     Education Details Goals, POC moving forward, using AD to normalize gait, use of compression garments    Person(s) Educated Patient    Methods Explanation    Comprehension Verbalized understanding              PT Short Term Goals - 11/28/20 1020       PT SHORT TERM GOAL #1   Title PT to be I in HEP to allow improved ROM    Time 3    Period Weeks    Status On-going    Target Date 12/14/20      PT SHORT TERM GOAL #2   Title PT strength to improve one grade in her ankle to allow pt to feel confident walking without any assistive device.    Time 3    Status On-going      PT SHORT TERM GOAL #3   Title PT activity tolerance to increase to allow pt to complete 2 hour of standing/walking actiivity to allow pt to complete shopping and housework activity without having to take a break.    Time 3    Period Weeks    Status New               PT Long Term Goals - 11/28/20 1020       PT LONG TERM GOAL #1   Title Pt to be I in advance HEP to allow normal ROM to allow a normal  gait pattern.    Time 6    Period Weeks    Status On-going      PT LONG TERM GOAL #2   Title Pt strength in Lt ankle to be at least a 4+/5 to be able to go up and down 12 steps in a reciprocal manner without a handrail    Time 6    Period Weeks    Status On-going      PT LONG TERM GOAL #3   Title Pt pain level after a full days activity to be no more than a 1/10 to allow to sleep without difficulty.    Time 6    Period Weeks    Status On-going      PT LONG TERM GOAL #4   Title PT mobility to improve to allow pt to get on her horse using her Lt LE to hoist her over the saddle.    Time 6    Period Weeks                   Plan - 11/28/20 1012     Clinical Impression Statement Pt comes today without AD with reduced antalgia as up and moving around.  Suggested using a SPC or crutch to help reduce and allow more work on heel to toe gait.  Began AROM using BAPS with verbal and manual cues to stabilize.  Towel inversion/eversion added with additional 3# weight to work on strengthening.  pt with more difficulty going out into  eversion.  Manual completed at end of session to reduce edema and improve ROM of toes/forefoot.  pt tolerated all well and reported improvement with reduced soreness at end of session.    Personal Factors and Comorbidities Comorbidity 2    Comorbidities hx of osteomylitis, pt has had kidney and pancreas transplant is on immunosuppressants.    Examination-Activity Limitations Caring for Others;Carry;Lift;Locomotion Level;Stand;Stairs;Squat    Examination-Participation Restrictions Cleaning;Community Activity;Shop;Yard Work;Occupation    Stability/Clinical Decision Making Evolving/Moderate complexity    Rehab Potential Good    PT Frequency 2x / week    PT Duration 6 weeks    PT Treatment/Interventions Gait training;Stair training;Therapeutic exercise;Balance training;Patient/family education;Scar mobilization;Passive range of motion;Manual techniques    PT Next  Visit Plan Continue manual for edema (retro or decongestive lymph techniques). Begin isometrics and seated heelslide to improve ROM. Continue to progress to standing activity starting with static balance.    PT Home Exercise Plan Exercises  Exercises Ankle  Ankle Exercises: Seated  Towel Crunch 1 rep  Heel Raises Left;10 reps  Toe Raise 10 reps  Other Seated Ankle Exercises toe spread, Lt great toe extension, lesser toe extension x 10  Ankle Exercises: Supine  Other Supine Ankle Exercises ankle pump for ROM             Patient will benefit from skilled therapeutic intervention in order to improve the following deficits and impairments:  Abnormal gait, Decreased activity tolerance, Decreased balance, Decreased range of motion, Decreased scar mobility, Decreased strength, Difficulty walking, Increased edema, Increased fascial restricitons, Pain  Visit Diagnosis: Pain in left ankle and joints of left foot  Stiffness of left ankle, not elsewhere classified  Difficulty in walking, not elsewhere classified     Problem List Patient Active Problem List   Diagnosis Date Noted   Port-A-Cath in place 11/22/2020   Pyogenic inflammation of bone ([place]) 0000000   Hardware complicating wound infection ([place]) 10/04/2020   Medication monitoring encounter 10/04/2020   Abscess of left foot 09/20/2020   Osteomyelitis of foot, left, acute ([place]) 09/20/2020   Cellulitis of left foot 09/19/2020   ASCUS with positive high risk human papillomavirus of vagina 05/01/2020   Abnormal uterine bleeding ([facility]) 04/19/2020   History of abnormal cervical Pap smear 10/06/2017   Encounter for gynecological examination with Papanicolaou smear of cervix 10/06/2017   MDD (major depressive disorder), recurrent episode, moderate ([place]) 03/19/2017   Atypical squamous cell changes of undetermined significance (ASCUS) on vaginal cytology with positive high risk human papilloma virus (HPV) 09/05/2016   Encounter for  surveillance of injectable contraceptive 08/30/2016   Urinary tract infection with hematuria 06/05/2016   Bad odor of urine 06/05/2016   Irregular intermenstrual bleeding 01/18/2016   Anxiety 01/18/2016   History of organ transplantation 08/18/2015   Abscess of calf 12/21/2014   Escherichia coli (E. 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

## 2020-11-29 ENCOUNTER — Telehealth: Payer: Self-pay | Admitting: Adult Health

## 2020-11-29 MED ORDER — TRAZODONE HCL 50 MG PO TABS: 50.0000 mg | ORAL_TABLET | Freq: Every evening | ORAL | 3 refills | Status: DC | PRN

## 2020-11-29 MED ORDER — FLUCONAZOLE 150 MG PO TABS: ORAL_TABLET | ORAL | 1 refills | Status: AC

## 2020-11-29 MED ORDER — VALACYCLOVIR HCL 1 G PO TABS
ORAL_TABLET | ORAL | 3 refills | Status: DC
Start: 2020-11-29 — End: 2022-12-18

## 2020-11-29 NOTE — Telephone Encounter (Signed)
Refilled trazodone, will rx diflucan and valtrex

## 2020-11-29 NOTE — Telephone Encounter (Signed)
Patient called wanting to see if you could call in Diflucan for a yeast infection, trazedone and something for fever blisters. She wants her medicines to go to [facility].

## 2020-11-30 ENCOUNTER — Ambulatory Visit (HOSPITAL_COMMUNITY): Payer: BC Managed Care – PPO | Admitting: Physical Therapy

## 2020-11-30 ENCOUNTER — Other Ambulatory Visit: Payer: Self-pay

## 2020-11-30 DIAGNOSIS — R262 Difficulty in walking, not elsewhere classified: Secondary | ICD-10-CM

## 2020-11-30 DIAGNOSIS — M25572 Pain in left ankle and joints of left foot: Secondary | ICD-10-CM | POA: Diagnosis not present

## 2020-11-30 DIAGNOSIS — M25672 Stiffness of left ankle, not elsewhere classified: Secondary | ICD-10-CM

## 2020-11-30 NOTE — Therapy (Signed)
[place], [place], [address] Phone: [phone]   Fax:  [phone]  Physical Therapy Treatment  Patient Details  Name: Tracy [name] MRN: [id number] Date of Birth: October 01, 1985 Referring Provider (PT): Dondra Prader   Encounter Date: 11/30/2020   PT End of Session - 11/30/20 1650     Visit Number 3    Number of Visits 16    Date for PT Re-Evaluation 01/04/21    Authorization Type [employer]    Authorization - Visit Number 3    Authorization - Number of Visits 30    Progress Note Due on Visit 10    PT Start Time 7903    PT Stop Time 1650    PT Time Calculation (min) 40 min    Activity Tolerance Patient tolerated treatment well             Past Medical History:  Diagnosis Date   Abscess of left foot 09/20/2020   Abscess of right thigh    Anemia    presently on iron supplement   Anxiety    ASCUS with positive high risk human papillomavirus of vagina 05/01/2020   04/2020 ASCUS +HPV 16 and other will get Colpo    Atypical squamous cell changes of undetermined significance (ASCUS) on vaginal cytology with positive high risk human papilloma virus (HPV) 09/05/2016   Get colpo   Chronic kidney disease    on dialysis T, Th, Sat-  2017 [place] Tansplant   CMV (cytomegalovirus infection) ([facility])    Complication of anesthesia    Depression    Detached retina    Diabetes mellitus without complication ([place])    Type 1- had a pancreas transplant 2017   History of organ transplantation 08/18/2015   Kidney and Pancreates   HSV-1 (herpes simplex virus 1) infection    HSV-2 (herpes simplex virus 2) infection    Hypertension    Hypothyroidism    Irregular periods 07/05/2014   Neuropathy    in feet   Neuropathy    Osteomyelitis of left foot ([place]) 09/20/2020   PONV (postoperative nausea and vomiting)    Seizures ([place]) 01/2014   ? due to blood sugar   Thyroid enlargement    "not on medication at this time"   Urinary  tract infection    hx of   Vaginal discharge 07/05/2014   Vaginal Pap smear, abnormal    Yeast infection    took diflucan saturday     Past Surgical History:  Procedure Laterality Date   APPLICATION OF A-CELL OF EXTREMITY Right 10/03/2014   Procedure: APPLICATION OF A-CELL OF RIGHT UPPER THIGH WOUND;  Surgeon: Renette Butters, MD;  Location: [facility];  Service: Orthopedics;  Laterality: Right;   APPLICATION OF WOUND VAC Right 10/03/2014   Procedure: APPLICATION OF WOUND VAC RIGHT THIGH;  Surgeon: Renette Butters, MD;  Location: [facility];  Service: Orthopedics;  Laterality: Right;   APPLICATION OF WOUND VAC  10/25/2020   Procedure: APPLICATION OF WOUND VAC;  Surgeon: Newt Minion, MD;  Location: [facility];  Service: Orthopedics;;   AV FISTULA PLACEMENT Left 09/01/2014   Procedure: BRACHIOCEPHALIC ARTERIOVENOUS (AV) FISTULA CREATION;  Surgeon: Conrad Norman, MD;  Location: [facility];  Service: Vascular;  Laterality: Left;   CHOLECYSTECTOMY     COMBINED KIDNEY-PANCREAS TRANSPLANT  05/15/15   I & D EXTREMITY Right 08/14/2014   Procedure: IRRIGATION AND DEBRIDEMENT EXTREMITY WITH MUSCLE BIOPSY;  Surgeon: Roseanne Kaufman, MD;  Location: [facility];  Service: Orthopedics;  Laterality: Right;   I & D EXTREMITY Right 08/20/2014   Procedure:  I AND D LEG / THIGH ABSCESS AND MANIPULATION OF RIGHT ELBOW.;  Surgeon: Renette Butters, MD;  Location: [facility];  Service: Orthopedics;  Laterality: Right;   I & D EXTREMITY Right 09/30/2014   Procedure: IRRIGATION AND DEBRIDEMENT RIGHT THIGH ABSCESS;  Surgeon: [name], MD;  Location: [facility];  Service: Orthopedics;  Laterality: Right;   I & D EXTREMITY Right 10/03/2014   Procedure: IRRIGATION AND DEBRIDEMENT RIGHT THIGH ABSCESS,WITH WOUND CLOSURE & MANIPULATION OF RIGHT KNEE;  Surgeon: Renette Butters, MD;  Location: [facility];  Service: Orthopedics;  Laterality: Right;   I & D EXTREMITY Left 09/19/2020   Procedure: IRRIGATION AND DEBRIDEMENT EXTREMITY;  Surgeon: Altamese Max, MD;   Location: [facility];  Service: Orthopedics;  Laterality: Left;   I & D EXTREMITY Left 09/21/2020   Procedure: IRRIGATION AND DEBRIDEMENT EXTREMITY, partial excision bone 2nd metatarsal;  Surgeon: Altamese Petroleum, MD;  Location: [facility];  Service: Orthopedics;  Laterality: Left;   I & D EXTREMITY Left 10/25/2020   Procedure: LEFT FOOT DEBRIDEMENT;  Surgeon: Newt Minion, MD;  Location: [facility];  Service: Orthopedics;  Laterality: Left;   INCISION AND DRAINAGE ABSCESS Right 12/09/2014   Procedure: INCISION AND DRAINAGE ABSCESS;  Surgeon: Renette Butters, MD;  Location: [facility];  Service: Orthopedics;  Laterality: Right;   INSERTION OF DIALYSIS CATHETER N/A 09/01/2014   Procedure: INSERTION OF DIALYSIS CATHETER IN RIGHT INTERNAL JUGUILAR;  Surgeon: Conrad Burnettown, MD;  Location: [facility];  Service: Vascular;  Laterality: N/A;   KIDNEY TRANSPLANT  05/15/15   OPEN REDUCTION INTERNAL FIXATION (ORIF) FOOT LISFRANC FRACTURE Left 07/28/2020   Procedure: OPEN REDUCTION INTERNAL FIXATION (ORIF) FOOT LISFRANC FRACTURE;  Surgeon: Altamese Verona Walk, MD;  Location: [facility];  Service: Orthopedics;  Laterality: Left;   ORIF TOE FRACTURE Left 07/28/2020   Procedure: OPEN REDUCTION INTERNAL FIXATION (ORIF) METATARSAL (TOE) FRACTURE;  Surgeon: Altamese Urich, MD;  Location: [facility];  Service: Orthopedics;  Laterality: Left;   PARS PLANA VITRECTOMY  10/01/2011   Procedure: PARS PLANA VITRECTOMY WITH 25 GAUGE;  Surgeon: Hayden Pedro, MD;  Location: [facility];  Service: Ophthalmology;  Laterality: Right;  Repair Complex Traction Retinal Detachment   PERCUTANEOUS PINNING Left 07/28/2020   Procedure: PERCUTANEOUS PINNING  2ND AND 3RD METATARSALS;  Surgeon: Altamese Kingsbury, MD;  Location: [facility];  Service: Orthopedics;  Laterality: Left;   PILONIDAL CYST EXCISION     TRIGGER FINGER RELEASE Right 09/21/13   WISDOM TOOTH EXTRACTION      There were no vitals filed for this visit.   Subjective Assessment - 11/30/20 1610     Subjective PT states that  she has been doing the exercises and has no questions.  She is concern about the swelling of her foot at the end of the day.  She bout a carbon insole that the MD recommended but can not tell a difference.    Pertinent History fx of multiple bones on LT foot on 6/3 ; with ORIF on 6/17 due to severe swelling. PT  needed 3 I&D with IV and oral antibiotics, pt on immunosuppressants for kidney and pancreas transplant,    Limitations Walking;Standing;House hold activities    How long can you sit comfortably? no problem    How long can you stand comfortably? 20-30 mintues    How long can you walk comfortably? 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 No/denies    Pain Score --   sitting no pain, when she is up the soreness on her ankle will increase to a 7/10   Pain Orientation Left    Pain Descriptors / Indicators Aching;Sore    Pain Type Acute pain    Pain Radiating Towards to toes    Pain Onset More than a month ago    Pain Frequency Intermittent    Aggravating Factors  WB    Pain Relieving Factors rest                               [facility] Adult PT Treatment/Exercise - 11/30/20 0001       Exercises   Exercises Ankle      Manual Therapy   Manual Therapy Edema management;Passive ROM    Manual therapy comments completed at EOS seperate from all other skilled interventions    Edema Management LE's elevated, retro massage    Passive ROM gentle to toes, forefoot to improve ROM      Ankle Exercises: Seated   ABC's Limitations 1x    Towel Crunch 3 reps    Towel Inversion/Eversion 5 reps;Weights   1.5# weight on end of towel   Heel Raises Left;10 reps    Toe Raise 10 reps    BAPS 10 reps;Level 3   dorsi/plantar, in/eversion and around the clock   Other Seated Ankle Exercises toe spread, Lt great toe extension, lesser toe extension x 10                        PT Short Term Goals - 11/30/20 1658       PT SHORT TERM GOAL #1   Title PT to be I in HEP to allow improved ROM    Time 3    Period Weeks    Status Partially Met    Target Date 12/14/20      PT SHORT TERM GOAL #2   Title PT strength to improve one grade in her ankle to allow pt to feel confident walking without any assistive device.    Time 3    Status On-going      PT SHORT TERM GOAL #3   Title PT activity tolerance to increase to allow pt to complete 2 hour of standing/walking actiivity to allow pt to complete shopping and housework activity without having to take a break.    Time 3    Period Weeks    Status On-going               PT Long Term Goals - 11/28/20 1020       PT LONG TERM GOAL #1   Title Pt to be I in advance HEP to allow normal ROM to allow a normal gait pattern.    Time 6    Period Weeks    Status On-going      PT LONG TERM GOAL #2   Title Pt strength in Lt ankle to be at least a 4+/5 to be able to go up and down 12 steps in a reciprocal manner without a handrail    Time 6    Period Weeks    Status On-going      PT LONG TERM GOAL #3  Title Pt pain level after a full days activity to be no more than a 1/10 to allow to sleep without difficulty.    Time 6    Period Weeks    Status On-going      PT LONG TERM GOAL #4   Title PT mobility to improve to allow pt to get on her horse using her Lt LE to hoist her over the saddle.    Time 6    Period Weeks                   Plan - 11/30/20 1651     Clinical Impression Statement Pt to department walkng without an assistive device but with a significant limp.  Insturcted pt that it would be better to ambulate with a cane or crutch to eliminate the limp.  Began ABC to improve motion, isometrics to strengthen.  Pt continues to have swelling measured and gave pt pamphlet for elastic therapy.    Personal Factors and Comorbidities Comorbidity 2    Comorbidities hx of  osteomylitis, pt has had kidney and pancreas transplant is on immunosuppressants.    Examination-Activity Limitations Caring for Others;Carry;Lift;Locomotion Level;Stand;Stairs;Squat    Examination-Participation Restrictions Cleaning;Community Activity;Shop;Yard Work;Occupation    Stability/Clinical Decision Making Evolving/Moderate complexity    Rehab Potential Good    PT Frequency 2x / week    PT Duration 6 weeks    PT Treatment/Interventions Gait training;Stair training;Therapeutic exercise;Balance training;Patient/family education;Scar mobilization;Passive range of motion;Manual techniques    PT Next Visit Plan Continue manual for edema (retro or decongestive lymph techniques). Begin t band and seated heelslide to improve ROM.  progress to standing activity starting with static balance.    PT Home Exercise Plan Exercises  Exercises Ankle  Ankle Exercises: Seated  Towel Crunch 1 rep  Heel Raises Left;10 reps  Toe Raise 10 reps  Other Seated Ankle Exercises toe spread, Lt great toe extension, lesser toe extension x 10  Ankle Exercises: Supine  Other Supine Ankle Exercises ankle pump for ROM             Patient will benefit from skilled therapeutic intervention in order to improve the following deficits and impairments:  Abnormal gait, Decreased activity tolerance, Decreased balance, Decreased range of motion, Decreased scar mobility, Decreased strength, Difficulty walking, Increased edema, Increased fascial restricitons, Pain  Visit Diagnosis: Pain in left ankle and joints of left foot  Stiffness of left ankle, not elsewhere classified  Difficulty in walking, not elsewhere classified     Problem List Patient Active Problem List   Diagnosis Date Noted   Port-A-Cath in place 11/22/2020   Pyogenic inflammation of bone ([facility]) 25/95/6387   Hardware complicating wound infection ([place]) 10/04/2020   Medication monitoring encounter 10/04/2020   Abscess of left foot 09/20/2020    Osteomyelitis of foot, left, acute ([place]) 09/20/2020   Cellulitis of left foot 09/19/2020   ASCUS with positive high risk human papillomavirus of vagina 05/01/2020   Abnormal uterine bleeding ([facility]) 04/19/2020   History of abnormal cervical Pap smear 10/06/2017   Encounter for gynecological examination with Papanicolaou smear of cervix 10/06/2017   MDD (major depressive disorder), recurrent episode, moderate ([place]) 03/19/2017   Atypical squamous cell changes of undetermined significance (ASCUS) on vaginal cytology with positive high risk human papilloma virus (HPV) 09/05/2016   Encounter for surveillance of injectable contraceptive 08/30/2016   Urinary tract infection with hematuria 06/05/2016   Bad odor of urine 06/05/2016   Irregular intermenstrual bleeding 01/18/2016  Anxiety 01/18/2016   History of organ transplantation 08/18/2015   Abscess of calf 12/21/2014   Escherichia coli (E. 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

## 2020-12-05 ENCOUNTER — Ambulatory Visit (HOSPITAL_COMMUNITY): Payer: BC Managed Care – PPO

## 2020-12-05 ENCOUNTER — Other Ambulatory Visit: Payer: Self-pay

## 2020-12-05 ENCOUNTER — Encounter (HOSPITAL_COMMUNITY): Payer: Self-pay

## 2020-12-05 DIAGNOSIS — M25672 Stiffness of left ankle, not elsewhere classified: Secondary | ICD-10-CM

## 2020-12-05 DIAGNOSIS — M25572 Pain in left ankle and joints of left foot: Secondary | ICD-10-CM

## 2020-12-05 DIAGNOSIS — R262 Difficulty in walking, not elsewhere classified: Secondary | ICD-10-CM

## 2020-12-05 NOTE — Therapy (Signed)
[facility] [address] [place], [place], [address] Phone: [phone]   Fax:  [phone]  Physical Therapy Treatment  Patient Details  Name: Tracy [name] MRN: [id number] Date of Birth: 06/11/1985 Referring Provider (PT): Erin Zamora   Encounter Date: 12/05/2020   PT End of Session - 12/05/20 1427     Visit Number 4    Number of Visits 16    Date for PT Re-Evaluation 01/04/21    Authorization Type [employer]    Authorization - Visit Number 4    Authorization - Number of Visits 30    Progress Note Due on Visit 10    PT Start Time 1605    PT Stop Time 1647    PT Time Calculation (min) 42 min    Activity Tolerance Patient tolerated treatment well    Behavior During Therapy WFL for tasks assessed/performed             Past Medical History:  Diagnosis Date   Abscess of left foot 09/20/2020   Abscess of right thigh    Anemia    presently on iron supplement   Anxiety    ASCUS with positive high risk human papillomavirus of vagina 05/01/2020   04/2020 ASCUS +HPV 16 and other will get Colpo    Atypical squamous cell changes of undetermined significance (ASCUS) on vaginal cytology with positive high risk human papilloma virus (HPV) 09/05/2016   Get colpo   Chronic kidney disease    on dialysis T, Th, Sat-  2017 [place] Tansplant   CMV (cytomegalovirus infection) (HCC)    Complication of anesthesia    Depression    Detached retina    Diabetes mellitus without complication (HCC)    Type 1- had a pancreas transplant 2017   History of organ transplantation 08/18/2015   Kidney and Pancreates   HSV-1 (herpes simplex virus 1) infection    HSV-2 (herpes simplex virus 2) infection    Hypertension    Hypothyroidism    Irregular periods 07/05/2014   Neuropathy    in feet   Neuropathy    Osteomyelitis of left foot (HCC) 09/20/2020   PONV (postoperative nausea and vomiting)    Seizures (HCC) 01/2014   ? due to blood sugar   Thyroid  enlargement    "not on medication at this time"   Urinary tract infection    hx of   Vaginal discharge 07/05/2014   Vaginal Pap smear, abnormal    Yeast infection    took diflucan saturday     Past Surgical History:  Procedure Laterality Date   APPLICATION OF A-CELL OF EXTREMITY Right 10/03/2014   Procedure: APPLICATION OF A-CELL OF RIGHT UPPER THIGH WOUND;  Surgeon: Timothy D Murphy, MD;  Location: [facility];  Service: Orthopedics;  Laterality: Right;   APPLICATION OF WOUND VAC Right 10/03/2014   Procedure: APPLICATION OF WOUND VAC RIGHT THIGH;  Surgeon: Timothy D Murphy, MD;  Location: [facility];  Service: Orthopedics;  Laterality: Right;   APPLICATION OF WOUND VAC  10/25/2020   Procedure: APPLICATION OF WOUND VAC;  Surgeon: Duda, Marcus V, MD;  Location: [facility];  Service: Orthopedics;;   AV FISTULA PLACEMENT Left 09/01/2014   Procedure: BRACHIOCEPHALIC ARTERIOVENOUS (AV) FISTULA CREATION;  Surgeon: Brian L Chen, MD;  Location: [facility];  Service: Vascular;  Laterality: Left;   CHOLECYSTECTOMY     COMBINED KIDNEY-PANCREAS TRANSPLANT  05/15/15   I & D EXTREMITY Right 08/14/2014   Procedure: IRRIGATION AND DEBRIDEMENT   EXTREMITY WITH MUSCLE BIOPSY;  Surgeon: Roseanne Kaufman, MD;  Location: [facility];  Service: Orthopedics;  Laterality: Right;   I & D EXTREMITY Right 08/20/2014   Procedure:  I AND D LEG / THIGH ABSCESS AND MANIPULATION OF RIGHT ELBOW.;  Surgeon: Renette Butters, MD;  Location: [place];  Service: Orthopedics;  Laterality: Right;   I & D EXTREMITY Right 09/30/2014   Procedure: IRRIGATION AND DEBRIDEMENT RIGHT THIGH ABSCESS;  Surgeon: [name], MD;  Location: [place];  Service: Orthopedics;  Laterality: Right;   I & D EXTREMITY Right 10/03/2014   Procedure: IRRIGATION AND DEBRIDEMENT RIGHT THIGH ABSCESS,WITH WOUND CLOSURE & MANIPULATION OF RIGHT KNEE;  Surgeon: Renette Butters, MD;  Location: [facility];  Service: Orthopedics;  Laterality: Right;   I & D EXTREMITY Left 09/19/2020   Procedure: IRRIGATION  AND DEBRIDEMENT EXTREMITY;  Surgeon: Altamese Florissant, MD;  Location: [place];  Service: Orthopedics;  Laterality: Left;   I & D EXTREMITY Left 09/21/2020   Procedure: IRRIGATION AND DEBRIDEMENT EXTREMITY, partial excision bone 2nd metatarsal;  Surgeon: Altamese [place], MD;  Location: [place];  Service: Orthopedics;  Laterality: Left;   I & D EXTREMITY Left 10/25/2020   Procedure: LEFT FOOT DEBRIDEMENT;  Surgeon: Newt Minion, MD;  Location: [place];  Service: Orthopedics;  Laterality: Left;   INCISION AND DRAINAGE ABSCESS Right 12/09/2014   Procedure: INCISION AND DRAINAGE ABSCESS;  Surgeon: Renette Butters, MD;  Location: [place];  Service: Orthopedics;  Laterality: Right;   INSERTION OF DIALYSIS CATHETER N/A 09/01/2014   Procedure: INSERTION OF DIALYSIS CATHETER IN RIGHT INTERNAL JUGUILAR;  Surgeon: Conrad Rio [place], MD;  Location: [facility];  Service: Vascular;  Laterality: N/A;   KIDNEY TRANSPLANT  05/15/15   OPEN REDUCTION INTERNAL FIXATION (ORIF) FOOT LISFRANC FRACTURE Left 07/28/2020   Procedure: OPEN REDUCTION INTERNAL FIXATION (ORIF) FOOT LISFRANC FRACTURE;  Surgeon: Altamese Henderson, MD;  Location: [place];  Service: Orthopedics;  Laterality: Left;   ORIF TOE FRACTURE Left 07/28/2020   Procedure: OPEN REDUCTION INTERNAL FIXATION (ORIF) METATARSAL (TOE) FRACTURE;  Surgeon: Altamese Yeadon, MD;  Location: [place];  Service: Orthopedics;  Laterality: Left;   PARS PLANA VITRECTOMY  10/01/2011   Procedure: PARS PLANA VITRECTOMY WITH 25 GAUGE;  Surgeon: Hayden Pedro, MD;  Location: [place];  Service: Ophthalmology;  Laterality: Right;  Repair Complex Traction Retinal Detachment   PERCUTANEOUS PINNING Left 07/28/2020   Procedure: PERCUTANEOUS PINNING  2ND AND 3RD METATARSALS;  Surgeon: Altamese [place], MD;  Location: [facility];  Service: Orthopedics;  Laterality: Left;   PILONIDAL CYST EXCISION     TRIGGER FINGER RELEASE Right 09/21/13   WISDOM TOOTH EXTRACTION      There were no vitals filed for this visit.   Subjective  Assessment - 12/05/20 1410     Subjective Feeling good, no reports of pain.  Main issue wiht swelling on her foot at end of the day.    Pertinent History fx of multiple bones on LT foot on 6/3 ; with ORIF on 6/17 due to severe swelling. 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 No/denies                [facility] PT Assessment - 12/05/20 0001  Assessment   Medical Diagnosis s/P ORIF of Lt foot with complication of osteomyolitis with 3 I&D    Referring Provider (PT) Erin Zamora    Onset Date/Surgical Date 10/25/20   6/17 initial ORIF, I&D: 8/9; 8/13/ 9/14   Next MD Visit 12/25/20    Prior Therapy IP      Precautions   Precautions --   infection                          [facility] Adult PT Treatment/Exercise - 12/05/20 0001       Exercises   Exercises Ankle      Manual Therapy   Manual Therapy Edema management;Passive ROM    Manual therapy comments completed at EOS seperate from all other skilled interventions    Edema Management LE's elevated, retro massage    Passive ROM gentle to toes, forefoot to improve ROM      Ankle Exercises: Seated   Towel Crunch 1 rep    Heel Raises Left;10 reps    Toe Raise 10 reps    BAPS Sitting;Level 3;10 reps   DF/PF; Inv/Ev; CW.   Heel Slides 10 reps;Left   10" holds for DF     Ankle Exercises: Supine   T-Band RTB 10x 5" all directions                       PT Short Term Goals - 11/30/20 1658       PT SHORT TERM GOAL #1   Title PT to be I in HEP to allow improved ROM    Time 3    Period Weeks    Status Partially Met    Target Date 12/14/20      PT SHORT TERM GOAL #2   Title PT strength to improve one grade in her ankle to allow pt to feel confident walking without any assistive  device.    Time 3    Status On-going      PT SHORT TERM GOAL #3   Title PT activity tolerance to increase to allow pt to complete 2 hour of standing/walking actiivity to allow pt to complete shopping and housework activity without having to take a break.    Time 3    Period Weeks    Status On-going               PT Long Term Goals - 11/28/20 1020       PT LONG TERM GOAL #1   Title Pt to be I in advance HEP to allow normal ROM to allow a normal gait pattern.    Time 6    Period Weeks    Status On-going      PT LONG TERM GOAL #2   Title Pt strength in Lt ankle to be at least a 4+/5 to be able to go up and down 12 steps in a reciprocal manner without a handrail    Time 6    Period Weeks    Status On-going      PT LONG TERM GOAL #3   Title Pt pain level after a full days activity to be no more than a 1/10 to allow to sleep without difficulty.    Time 6    Period Weeks    Status On-going      PT LONG TERM GOAL #4   Title PT mobility to improve to allow pt to get on her horse   using her Lt LE to hoist her over the saddle.    Time 6    Period Weeks                   Plan - 12/05/20 1459     Clinical Impression Statement Added mobility activities for ankle ROM and theraband for strengthening.  Noted visual musculature fatigue with plantar flexion movements due to weakness, added therabands to HEP with printout given and verablized understanding.  Manual complete to address swelling dorsal aspect.  No reports of pain through session,    Personal Factors and Comorbidities Comorbidity 2    Comorbidities hx of osteomylitis, pt has had kidney and pancreas transplant is on immunosuppressants.    Examination-Activity Limitations Caring for Others;Carry;Lift;Locomotion Level;Stand;Stairs;Squat    Examination-Participation Restrictions Cleaning;Community Activity;Shop;Yard Work;Occupation    Stability/Clinical Decision Making Evolving/Moderate complexity    Clinical  Decision Making Moderate    Rehab Potential Good    PT Frequency 2x / week    PT Duration 6 weeks    PT Treatment/Interventions Gait training;Stair training;Therapeutic exercise;Balance training;Patient/family education;Scar mobilization;Passive range of motion;Manual techniques    PT Next Visit Plan Continue manual for edema (retro or decongestive lymph techniques). Next session progress to standing including rockerboard, static balance and STS.    PT Home Exercise Plan Exercises  Exercises Ankle  Ankle Exercises: Seated  Towel Crunch 1 rep  Heel Raises Left;10 reps  Toe Raise 10 reps  Other Seated Ankle Exercises toe spread, Lt great toe extension, lesser toe extension x 10  Ankle Exercises: Supine  Other Supine Ankle Exercises ankle pump for ROM; 10/25: GTB all directions    Consulted and Agree with Plan of Care Patient             Patient will benefit from skilled therapeutic intervention in order to improve the following deficits and impairments:  Abnormal gait, Decreased activity tolerance, Decreased balance, Decreased range of motion, Decreased scar mobility, Decreased strength, Difficulty walking, Increased edema, Increased fascial restricitons, Pain  Visit Diagnosis: Stiffness of left ankle, not elsewhere classified  Pain in left ankle and joints of left foot  Difficulty in walking, not elsewhere classified     Problem List Patient Active Problem List   Diagnosis Date Noted   Port-A-Cath in place 11/22/2020   Pyogenic inflammation of bone ([place]) 35/70/1779   Hardware complicating wound infection ([place]) 10/04/2020   Medication monitoring encounter 10/04/2020   Abscess of left foot 09/20/2020   Osteomyelitis of foot, left, acute ([place]) 09/20/2020   Cellulitis of left foot 09/19/2020   ASCUS with positive high risk human papillomavirus of vagina 05/01/2020   Abnormal uterine bleeding (AUB) 04/19/2020   History of abnormal cervical Pap smear 10/06/2017   Encounter for  gynecological examination with Papanicolaou smear of cervix 10/06/2017   MDD (major depressive disorder), recurrent episode, moderate ([place]) 03/19/2017   Atypical squamous cell changes of undetermined significance (ASCUS) on vaginal cytology with positive high risk human papilloma virus (HPV) 09/05/2016   Encounter for surveillance of injectable contraceptive 08/30/2016   Urinary tract infection with hematuria 06/05/2016   Bad odor of urine 06/05/2016   Irregular intermenstrual bleeding 01/18/2016   Anxiety 01/18/2016   History of organ transplantation 08/18/2015   Abscess of calf 12/21/2014   Escherichia coli (E. 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    

## 2020-12-07 ENCOUNTER — Ambulatory Visit (HOSPITAL_COMMUNITY): Payer: BC Managed Care – PPO | Admitting: Physical Therapy

## 2020-12-07 ENCOUNTER — Telehealth: Payer: Self-pay

## 2020-12-07 NOTE — Telephone Encounter (Signed)
Patient called stating that she is concerned she is having allergic reaction to Cefadroxil due to having itchy rash on shoulders, chest and back.  Call back number [place], [place]

## 2020-12-07 NOTE — Telephone Encounter (Signed)
Spoke with Tracy [name] on the phone about rash which has been present since she started cefadroxil after her PICC was pulled. She previously spoke with nursing here and shared she had started a new soap. She now has been off the soap for 2 weeks, and her rash is still present. She has no other changes regarding detergent, foods, soaps, or allergens. There is a shared R1 side chain between penicillin and cefadroxil, so a cross-reactivity is possible. In my personal opinion, I believe this is a drug reaction caused by cefadroxil from what information I have available.   As patient grew MSSA, the best option would be Keflex. However, Keflex also shares this R1 side chain, so I would avoid it. Other lesser options would be linezolid (no significant DDI) or doxycycline since Dr. Linus Salmons plans on treating for an additional 3-6 months after the PICC was pulled this month.  I will be out of office tomorrow, so could triage team please reach out to Dr. Linus Salmons for his preferred options? Informed patient we would attempt to get back with her tomorrow pending recommendations.  Thanks all!  Alfonse Spruce, PharmD, CPP Clinical Pharmacist Practitioner [facility]

## 2020-12-08 ENCOUNTER — Other Ambulatory Visit: Payer: Self-pay | Admitting: Internal Medicine

## 2020-12-08 MED ORDER — DOXYCYCLINE HYCLATE 100 MG PO TABS: 100.0000 mg | ORAL_TABLET | Freq: Two times a day (BID) | ORAL | 5 refills | Status: DC

## 2020-12-10 ENCOUNTER — Encounter: Payer: Self-pay | Admitting: Orthopedic Surgery

## 2020-12-10 NOTE — Progress Notes (Signed)
Office Visit Note   Patient: Tracy [name]           Date of Birth: 23-Mar-1985           MRN: [id number] Visit Date: 11/09/2020              Requested by: No referring provider defined for this encounter. PCP: Patient, No Pcp Per (Inactive)  Chief Complaint  Patient presents with   Left Foot - Routine Post Op      HPI: Patient is a 35 year old woman who presents 2 weeks status post debridement left foot.  Assessment & Plan: Visit Diagnoses:  1. Subacute osteomyelitis of left foot ([place])     Plan: Sutures harvested continue compression.  Follow-Up Instructions: Return in about 2 weeks (around 11/23/2020).   Ortho Exam  Patient is alert, oriented, no adenopathy, well-dressed, normal affect, normal respiratory effort. Examination the incision is well-healed there is a small amount of drainage she is wearing compression she has dorsiflexion 10 degrees past neutral sutures are harvested.  Imaging: No results found. No images are attached to the encounter.  Labs: Lab Results  Component Value Date   HGBA1C 6.0 (H) 09/19/2020   HGBA1C 8.9 (H) 08/10/2014   HGBA1C 16.8 (H) 01/20/2013   ESRSEDRATE 93 (H) 09/19/2020   ESRSEDRATE 12 11/09/2014   ESRSEDRATE 112 (H) 08/09/2014   CRP 25.7 (H) 09/19/2020   CRP <0.5 11/09/2014   CRP 28.5 (H) 08/09/2014   REPTSTATUS 10/31/2020 FINAL 10/25/2020   GRAMSTAIN  10/25/2020    RARE WBC PRESENT, PREDOMINANTLY MONONUCLEAR NO ORGANISMS SEEN    CULT  10/25/2020    No growth aerobically or anaerobically. Performed at [facility], [place] [address], [place], [place] [address]    LABORGA STAPHYLOCOCCUS AUREUS 09/19/2020     Lab Results  Component Value Date   ALBUMIN 2.2 (L) 09/23/2020   ALBUMIN 2.3 (L) 09/20/2020   ALBUMIN 3.2 (L) 07/28/2020    Lab Results  Component Value Date   MG 2.0 08/09/2014   MG 2.3 01/25/2014   MG 1.6 01/22/2014   No results found for: VD25OH  No results found for: PREALBUMIN CBC EXTENDED  Latest Ref Rng & Units 10/25/2020 09/23/2020 07/28/2020  WBC 4.0 - 10.5 K/uL 8.1 9.1 7.3  RBC 3.87 - 5.11 MIL/uL 3.83(L) 3.16(L) 4.40  HGB 12.0 - 15.0 g/dL 10.8(L) 8.5(L) 12.4  HCT 36.0 - 46.0 % 34.8(L) 28.4(L) 40.2  PLT 150 - 400 K/uL 190 246 219  NEUTROABS 1.7 - 7.7 K/uL - - 5.5  LYMPHSABS 0.7 - 4.0 K/uL - - 1.1     There is no height or weight on file to calculate BMI.  Orders:  No orders of the defined types were placed in this encounter.  No orders of the defined types were placed in this encounter.    Procedures: No procedures performed  Clinical Data: No additional findings.  ROS:  All other systems negative, except as noted in the HPI. Review of Systems  Objective: Vital Signs: LMP  (LMP Unknown) Comment: spotting  Specialty Comments:  No specialty comments available.  PMFS History: Patient Active Problem List   Diagnosis Date Noted   Port-A-Cath in place 11/22/2020   Pyogenic inflammation of bone ([place]) 43/32/9518   Hardware complicating wound infection ([place]) 10/04/2020   Medication monitoring encounter 10/04/2020   Abscess of left foot 09/20/2020   Osteomyelitis of foot, left, acute ([place]) 09/20/2020   Cellulitis of left foot 09/19/2020   ASCUS with  positive high risk human papillomavirus of vagina 05/01/2020   Abnormal uterine bleeding (AUB) 04/19/2020   History of abnormal cervical Pap smear 10/06/2017   Encounter for gynecological examination with Papanicolaou smear of cervix 10/06/2017   MDD (major depressive disorder), recurrent episode, moderate ([place]) 03/19/2017   Atypical squamous cell changes of undetermined significance (ASCUS) on vaginal cytology with positive high risk human papilloma virus (HPV) 09/05/2016   Encounter for surveillance of injectable contraceptive 08/30/2016   Urinary tract infection with hematuria 06/05/2016   Bad odor of urine 06/05/2016   Irregular intermenstrual bleeding 01/18/2016   Anxiety 01/18/2016   History of organ  transplantation 08/18/2015   Abscess of calf 12/21/2014   Escherichia coli (E. 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) 09/27/2011   Past Medical History:  Diagnosis Date   Abscess of left foot 09/20/2020   Abscess of right thigh    Anemia    presently on iron supplement   Anxiety    ASCUS with positive high risk human papillomavirus of vagina 05/01/2020   04/2020 ASCUS +HPV 16 and other will get Colpo    Atypical squamous cell changes of undetermined significance (ASCUS) on vaginal cytology with positive high risk human papilloma virus (HPV) 09/05/2016   Get colpo   Chronic kidney disease    on dialysis T, Th, Sat-  2017 [facility] Tansplant   CMV (cytomegalovirus infection) ([place])    Complication of anesthesia    Depression    Detached retina    Diabetes mellitus without complication ([place])    Type 1- had a pancreas transplant 2017   History of organ transplantation 08/18/2015   Kidney and Pancreates   HSV-1 (herpes simplex virus 1) infection    HSV-2 (herpes simplex virus 2) infection    Hypertension    Hypothyroidism    Irregular periods 07/05/2014   Neuropathy    in feet   Neuropathy    Osteomyelitis of left foot ([place]) 09/20/2020   PONV (postoperative nausea and vomiting)    Seizures ([place]) 01/2014   ? due to blood sugar   Thyroid enlargement    "not on medication at this time"   Urinary tract infection    hx of   Vaginal discharge 07/05/2014   Vaginal Pap smear, abnormal     Yeast infection    took diflucan saturday     Family History  Problem Relation Age of Onset   Cancer Paternal Grandfather        prostate   Hyperlipidemia Paternal Grandfather    Stroke Paternal Grandfather     Past Surgical History:  Procedure Laterality Date   APPLICATION OF A-CELL OF EXTREMITY Right 10/03/2014   Procedure: APPLICATION OF A-CELL OF RIGHT UPPER THIGH WOUND;  Surgeon: Renette Butters, MD;  Location: [facility];  Service: Orthopedics;  Laterality: Right;   APPLICATION OF WOUND VAC Right 10/03/2014   Procedure: APPLICATION OF WOUND VAC RIGHT THIGH;  Surgeon: Renette Butters, MD;  Location: [facility];  Service: Orthopedics;  Laterality: Right;   APPLICATION OF WOUND VAC  10/25/2020   Procedure: APPLICATION OF WOUND VAC;  Surgeon: Newt Minion, MD;  Location: [facility];  Service: Orthopedics;;   AV FISTULA PLACEMENT Left 09/01/2014   Procedure: BRACHIOCEPHALIC ARTERIOVENOUS (AV) FISTULA CREATION;  Surgeon: Conrad [place], MD;  Location: [facility];  Service: Vascular;  Laterality: Left;   CHOLECYSTECTOMY     COMBINED KIDNEY-PANCREAS TRANSPLANT  05/15/15   I & D EXTREMITY Right 08/14/2014   Procedure: IRRIGATION AND DEBRIDEMENT EXTREMITY WITH MUSCLE BIOPSY;  Surgeon: Roseanne Kaufman, MD;  Location: [facility];  Service: Orthopedics;  Laterality: Right;   I & D EXTREMITY Right 08/20/2014   Procedure:  I AND D LEG / THIGH ABSCESS AND MANIPULATION OF RIGHT ELBOW.;  Surgeon: Renette Butters, MD;  Location: [facility];  Service: Orthopedics;  Laterality: Right;   I & D EXTREMITY Right 09/30/2014   Procedure: IRRIGATION AND DEBRIDEMENT RIGHT THIGH ABSCESS;  Surgeon: [name], MD;  Location: [facility];  Service: Orthopedics;  Laterality: Right;   I & D EXTREMITY Right 10/03/2014   Procedure: IRRIGATION AND DEBRIDEMENT RIGHT THIGH ABSCESS,WITH WOUND CLOSURE & MANIPULATION OF RIGHT KNEE;  Surgeon: Renette Butters, MD;  Location: [facility];  Service: Orthopedics;  Laterality: Right;   I & D EXTREMITY Left 09/19/2020    Procedure: IRRIGATION AND DEBRIDEMENT EXTREMITY;  Surgeon: Altamese Idamay, MD;  Location: [facility];  Service: Orthopedics;  Laterality: Left;   I & D EXTREMITY Left 09/21/2020   Procedure: IRRIGATION AND DEBRIDEMENT EXTREMITY, partial excision bone 2nd metatarsal;  Surgeon: Altamese [place], MD;  Location: [facility];  Service: Orthopedics;  Laterality: Left;   I & D EXTREMITY Left 10/25/2020   Procedure: LEFT FOOT DEBRIDEMENT;  Surgeon: Newt Minion, MD;  Location: [facility];  Service: Orthopedics;  Laterality: Left;   INCISION AND DRAINAGE ABSCESS Right 12/09/2014   Procedure: INCISION AND DRAINAGE ABSCESS;  Surgeon: Renette Butters, MD;  Location: [facility];  Service: Orthopedics;  Laterality: Right;   INSERTION OF DIALYSIS CATHETER N/A 09/01/2014   Procedure: INSERTION OF DIALYSIS CATHETER IN RIGHT INTERNAL JUGUILAR;  Surgeon: Conrad [place], MD;  Location: [facility];  Service: Vascular;  Laterality: N/A;   KIDNEY TRANSPLANT  05/15/15   OPEN REDUCTION INTERNAL FIXATION (ORIF) FOOT LISFRANC FRACTURE Left 07/28/2020   Procedure: OPEN REDUCTION INTERNAL FIXATION (ORIF) FOOT LISFRANC FRACTURE;  Surgeon: Altamese [place], MD;  Location: [facility];  Service: Orthopedics;  Laterality: Left;   ORIF TOE FRACTURE Left 07/28/2020   Procedure: OPEN REDUCTION INTERNAL FIXATION (ORIF) METATARSAL (TOE) FRACTURE;  Surgeon: Altamese [place], MD;  Location: [facility];  Service: Orthopedics;  Laterality: Left;   PARS PLANA VITRECTOMY  10/01/2011   Procedure: PARS PLANA VITRECTOMY WITH 25 GAUGE;  Surgeon: Hayden Pedro, MD;  Location: [facility];  Service: Ophthalmology;  Laterality: Right;  Repair Complex Traction Retinal Detachment   PERCUTANEOUS PINNING Left 07/28/2020   Procedure: PERCUTANEOUS PINNING  2ND AND 3RD METATARSALS;  Surgeon: Altamese [place], MD;  Location: [facility];  Service: Orthopedics;  Laterality: Left;   PILONIDAL CYST EXCISION     TRIGGER FINGER RELEASE Right 09/21/13   WISDOM TOOTH EXTRACTION     Social History   Occupational History     Employer: [employer]  Tobacco Use   Smoking status: Never   Smokeless tobacco: Never  Vaping Use   Vaping Use: Never used  Substance and Sexual Activity   Alcohol use: No    Alcohol/week: 0.0 standard drinks   Drug use: No   Sexual activity:  Yes    Birth control/protection: Injection

## 2020-12-12 ENCOUNTER — Encounter (HOSPITAL_COMMUNITY): Payer: BC Managed Care – PPO

## 2020-12-14 ENCOUNTER — Encounter (HOSPITAL_COMMUNITY): Payer: BC Managed Care – PPO

## 2020-12-19 ENCOUNTER — Encounter (HOSPITAL_COMMUNITY): Payer: BC Managed Care – PPO | Admitting: Physical Therapy

## 2020-12-20 ENCOUNTER — Ambulatory Visit: Payer: BC Managed Care – PPO | Admitting: Internal Medicine

## 2020-12-21 ENCOUNTER — Encounter (HOSPITAL_COMMUNITY): Payer: BC Managed Care – PPO | Admitting: Physical Therapy

## 2020-12-26 ENCOUNTER — Encounter (HOSPITAL_COMMUNITY): Payer: BC Managed Care – PPO | Admitting: Physical Therapy

## 2020-12-28 ENCOUNTER — Ambulatory Visit: Payer: BC Managed Care – PPO | Admitting: Orthopedic Surgery

## 2020-12-29 ENCOUNTER — Encounter (HOSPITAL_COMMUNITY): Payer: BC Managed Care – PPO

## 2021-01-01 ENCOUNTER — Encounter (HOSPITAL_COMMUNITY): Payer: BC Managed Care – PPO | Admitting: Physical Therapy

## 2021-01-03 ENCOUNTER — Encounter (HOSPITAL_COMMUNITY): Payer: BC Managed Care – PPO

## 2021-01-09 ENCOUNTER — Other Ambulatory Visit: Payer: Self-pay

## 2021-01-09 ENCOUNTER — Ambulatory Visit: Payer: Self-pay

## 2021-01-09 ENCOUNTER — Ambulatory Visit (INDEPENDENT_AMBULATORY_CARE_PROVIDER_SITE_OTHER): Payer: BC Managed Care – PPO | Admitting: Orthopedic Surgery

## 2021-01-09 DIAGNOSIS — M79672 Pain in left foot: Secondary | ICD-10-CM

## 2021-01-18 ENCOUNTER — Ambulatory Visit (INDEPENDENT_AMBULATORY_CARE_PROVIDER_SITE_OTHER): Payer: BC Managed Care – PPO | Admitting: Orthopedic Surgery

## 2021-01-18 ENCOUNTER — Other Ambulatory Visit: Payer: Self-pay

## 2021-01-18 DIAGNOSIS — T847XXD Infection and inflammatory reaction due to other internal orthopedic prosthetic devices, implants and grafts, subsequent encounter: Secondary | ICD-10-CM

## 2021-01-30 ENCOUNTER — Encounter: Payer: Self-pay | Admitting: Orthopedic Surgery

## 2021-01-30 NOTE — Progress Notes (Signed)
Office Visit Note   Patient: LAKENDRA HELLING           Date of Birth: December 26, 1985           MRN: [id number] Visit Date: 01/18/2021              Requested by: No referring provider defined for this encounter. PCP: Patient, No Pcp Per (Inactive)  Chief Complaint  Patient presents with   Left Foot - Routine Post Op    10/25/20 left foot debridement with A cell       HPI: Patient is a 35 year old woman who is status post left foot debridement about 3 months ago.  Patient has started physical therapy.  Patient states she has some ankle soreness when walking on uneven terrain.  Assessment & Plan: Visit Diagnoses:  1. Hardware complicating wound infection, subsequent encounter     Plan: Continue therapy and strengthening.  Follow-Up Instructions: Return if symptoms worsen or fail to improve.   Ortho Exam  Patient is alert, oriented, no adenopathy, well-dressed, normal affect, normal respiratory effort. Examination the wound is completely healed she has dorsiflexion to neutral there is no redness no signs of infection  Imaging: No results found. No images are attached to the encounter.  Labs: Lab Results  Component Value Date   HGBA1C 6.0 (H) 09/19/2020   HGBA1C 8.9 (H) 08/10/2014   HGBA1C 16.8 (H) 01/20/2013   ESRSEDRATE 93 (H) 09/19/2020   ESRSEDRATE 12 11/09/2014   ESRSEDRATE 112 (H) 08/09/2014   CRP 25.7 (H) 09/19/2020   CRP <0.5 11/09/2014   CRP 28.5 (H) 08/09/2014   REPTSTATUS 10/31/2020 FINAL 10/25/2020   GRAMSTAIN  10/25/2020    RARE WBC PRESENT, PREDOMINANTLY MONONUCLEAR NO ORGANISMS SEEN    CULT  10/25/2020    No growth aerobically or anaerobically. Performed at [facility], [place] [address], [place], [place] [address]    LABORGA STAPHYLOCOCCUS AUREUS 09/19/2020     Lab Results  Component Value Date   ALBUMIN 2.2 (L) 09/23/2020   ALBUMIN 2.3 (L) 09/20/2020   ALBUMIN 3.2 (L) 07/28/2020    Lab Results  Component Value Date   MG 2.0  08/09/2014   MG 2.3 01/25/2014   MG 1.6 01/22/2014   No results found for: VD25OH  No results found for: PREALBUMIN CBC EXTENDED Latest Ref Rng & Units 10/25/2020 09/23/2020 07/28/2020  WBC 4.0 - 10.5 K/uL 8.1 9.1 7.3  RBC 3.87 - 5.11 MIL/uL 3.83(L) 3.16(L) 4.40  HGB 12.0 - 15.0 g/dL 10.8(L) 8.5(L) 12.4  HCT 36.0 - 46.0 % 34.8(L) 28.4(L) 40.2  PLT 150 - 400 K/uL 190 246 219  NEUTROABS 1.7 - 7.7 K/uL - - 5.5  LYMPHSABS 0.7 - 4.0 K/uL - - 1.1     There is no height or weight on file to calculate BMI.  Orders:  No orders of the defined types were placed in this encounter.  No orders of the defined types were placed in this encounter.    Procedures: No procedures performed  Clinical Data: No additional findings.  ROS:  All other systems negative, except as noted in the HPI. Review of Systems  Objective: Vital Signs: There were no vitals taken for this visit.  Specialty Comments:  No specialty comments available.  PMFS History: Patient Active Problem List   Diagnosis Date Noted   Port-A-Cath in place 11/22/2020   Pyogenic inflammation of bone ([place]) 12/75/1700   Hardware complicating wound infection ([place]) 10/04/2020   Medication monitoring encounter  10/04/2020   Abscess of left foot 09/20/2020   Osteomyelitis of foot, left, acute ([place]) 09/20/2020   Cellulitis of left foot 09/19/2020   ASCUS with positive high risk human papillomavirus of vagina 05/01/2020   Abnormal uterine bleeding (AUB) 04/19/2020   History of abnormal cervical Pap smear 10/06/2017   Encounter for gynecological examination with Papanicolaou smear of cervix 10/06/2017   MDD (major depressive disorder), recurrent episode, moderate ([place]) 03/19/2017   Atypical squamous cell changes of undetermined significance (ASCUS) on vaginal cytology with positive high risk human papilloma virus (HPV) 09/05/2016   Encounter for surveillance of injectable contraceptive 08/30/2016   Urinary tract infection with  hematuria 06/05/2016   Bad odor of urine 06/05/2016   Irregular intermenstrual bleeding 01/18/2016   Anxiety 01/18/2016   History of organ transplantation 08/18/2015   Abscess of calf 12/21/2014   Escherichia coli (E. 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, not stated as uncontrolled 10/16/2012   Hypothyroidism 10/15/2012   Proliferative diabetic retinopathy ([place]) 09/27/2011   Past Medical History:  Diagnosis Date   Abscess of left foot 09/20/2020   Abscess of right thigh    Anemia    presently on iron supplement   Anxiety    ASCUS with positive high risk human papillomavirus of vagina 05/01/2020   04/2020 ASCUS +HPV 16 and other will get Colpo    Atypical squamous cell changes of undetermined significance (ASCUS) on vaginal cytology with positive high risk human papilloma virus (HPV) 09/05/2016   Get colpo   Chronic kidney disease    on dialysis T, Th, Sat-  2017 [facility] Tansplant   CMV (cytomegalovirus infection) ([place])    Complication of anesthesia    Depression    Detached retina    Diabetes mellitus without complication ([place])    Type 1- had a pancreas transplant 2017   History of organ transplantation 08/18/2015   Kidney and Pancreates   HSV-1 (herpes simplex virus 1) infection    HSV-2 (herpes simplex virus 2) infection    Hypertension    Hypothyroidism    Irregular periods 07/05/2014   Neuropathy    in feet   Neuropathy    Osteomyelitis of left foot ([place]) 09/20/2020   PONV (postoperative nausea and vomiting)    Seizures ([place]) 01/2014   ? due to blood sugar   Thyroid enlargement     "not on medication at this time"   Urinary tract infection    hx of   Vaginal discharge 07/05/2014   Vaginal Pap smear, abnormal    Yeast infection    took diflucan saturday     Family History  Problem Relation Age of Onset   Cancer Paternal Grandfather        prostate   Hyperlipidemia Paternal Grandfather    Stroke Paternal Grandfather     Past Surgical History:  Procedure Laterality Date   APPLICATION OF A-CELL OF EXTREMITY Right 10/03/2014   Procedure: APPLICATION OF A-CELL OF RIGHT UPPER THIGH WOUND;  Surgeon: Renette Butters, MD;  Location: [facility];  Service: Orthopedics;  Laterality: Right;   APPLICATION OF WOUND VAC Right 10/03/2014   Procedure:  APPLICATION OF WOUND VAC RIGHT THIGH;  Surgeon: Renette Butters, MD;  Location: [facility];  Service: Orthopedics;  Laterality: Right;   APPLICATION OF WOUND VAC  10/25/2020   Procedure: APPLICATION OF WOUND VAC;  Surgeon: Newt Minion, MD;  Location: [facility];  Service: Orthopedics;;   AV FISTULA PLACEMENT Left 09/01/2014   Procedure: BRACHIOCEPHALIC ARTERIOVENOUS (AV) FISTULA CREATION;  Surgeon: Conrad [place], MD;  Location: [facility];  Service: Vascular;  Laterality: Left;   CHOLECYSTECTOMY     COMBINED KIDNEY-PANCREAS TRANSPLANT  05/15/15   I & D EXTREMITY Right 08/14/2014   Procedure: IRRIGATION AND DEBRIDEMENT EXTREMITY WITH MUSCLE BIOPSY;  Surgeon: Roseanne Kaufman, MD;  Location: [facility];  Service: Orthopedics;  Laterality: Right;   I & D EXTREMITY Right 08/20/2014   Procedure:  I AND D LEG / THIGH ABSCESS AND MANIPULATION OF RIGHT ELBOW.;  Surgeon: Renette Butters, MD;  Location: [facility];  Service: Orthopedics;  Laterality: Right;   I & D EXTREMITY Right 09/30/2014   Procedure: IRRIGATION AND DEBRIDEMENT RIGHT THIGH ABSCESS;  Surgeon: [name], MD;  Location: [facility];  Service: Orthopedics;  Laterality: Right;   I & D EXTREMITY Right 10/03/2014   Procedure: IRRIGATION AND DEBRIDEMENT RIGHT THIGH ABSCESS,WITH WOUND CLOSURE & MANIPULATION OF RIGHT  KNEE;  Surgeon: Renette Butters, MD;  Location: [facility];  Service: Orthopedics;  Laterality: Right;   I & D EXTREMITY Left 09/19/2020   Procedure: IRRIGATION AND DEBRIDEMENT EXTREMITY;  Surgeon: Altamese [place], MD;  Location: [facility];  Service: Orthopedics;  Laterality: Left;   I & D EXTREMITY Left 09/21/2020   Procedure: IRRIGATION AND DEBRIDEMENT EXTREMITY, partial excision bone 2nd metatarsal;  Surgeon: Altamese [place], MD;  Location: [facility];  Service: Orthopedics;  Laterality: Left;   I & D EXTREMITY Left 10/25/2020   Procedure: LEFT FOOT DEBRIDEMENT;  Surgeon: Newt Minion, MD;  Location: [facility];  Service: Orthopedics;  Laterality: Left;   INCISION AND DRAINAGE ABSCESS Right 12/09/2014   Procedure: INCISION AND DRAINAGE ABSCESS;  Surgeon: Renette Butters, MD;  Location: [facility];  Service: Orthopedics;  Laterality: Right;   INSERTION OF DIALYSIS CATHETER N/A 09/01/2014   Procedure: INSERTION OF DIALYSIS CATHETER IN RIGHT INTERNAL JUGUILAR;  Surgeon: Conrad [place], MD;  Location: [facility];  Service: Vascular;  Laterality: N/A;   KIDNEY TRANSPLANT  05/15/15   OPEN REDUCTION INTERNAL FIXATION (ORIF) FOOT LISFRANC FRACTURE Left 07/28/2020   Procedure: OPEN REDUCTION INTERNAL FIXATION (ORIF) FOOT LISFRANC FRACTURE;  Surgeon: Altamese [place], MD;  Location: [facility];  Service: Orthopedics;  Laterality: Left;   ORIF TOE FRACTURE Left 07/28/2020   Procedure: OPEN REDUCTION INTERNAL FIXATION (ORIF) METATARSAL (TOE) FRACTURE;  Surgeon: Altamese [place], MD;  Location: [facility];  Service: Orthopedics;  Laterality: Left;   PARS PLANA VITRECTOMY  10/01/2011   Procedure: PARS PLANA VITRECTOMY WITH 25 GAUGE;  Surgeon: Hayden Pedro, MD;  Location: [facility];  Service: Ophthalmology;  Laterality: Right;  Repair Complex Traction Retinal Detachment   PERCUTANEOUS PINNING Left 07/28/2020   Procedure: PERCUTANEOUS PINNING  2ND AND 3RD METATARSALS;  Surgeon: Altamese [place], MD;  Location: [facility];  Service: Orthopedics;  Laterality: Left;    PILONIDAL CYST EXCISION     TRIGGER FINGER RELEASE Right 09/21/13   WISDOM TOOTH EXTRACTION     Social History   Occupational History    Employer: [employer]  Tobacco Use   Smoking status: Never   Smokeless tobacco: Never  Vaping Use   Vaping  Use: Never used  Substance and Sexual Activity   Alcohol use: No    Alcohol/week: 0.0 standard drinks   Drug use: No   Sexual activity: Yes    Birth control/protection: Injection

## 2021-01-31 ENCOUNTER — Ambulatory Visit (INDEPENDENT_AMBULATORY_CARE_PROVIDER_SITE_OTHER): Payer: BC Managed Care – PPO

## 2021-01-31 DIAGNOSIS — Z3042 Encounter for surveillance of injectable contraceptive: Secondary | ICD-10-CM | POA: Diagnosis not present

## 2021-01-31 MED ORDER — MEDROXYPROGESTERONE ACETATE 150 MG/ML IM SUSP
150.0000 mg | Freq: Once | INTRAMUSCULAR | Status: AC
  Administered 2021-01-31: 10:00:00 150 mg via INTRAMUSCULAR

## 2021-01-31 NOTE — Progress Notes (Signed)
° °  NURSE VISIT- INJECTION  SUBJECTIVE:  Tracy [name] is a 35 y.o. [id number] female here for a Depo Provera for contraception/period management. She is a GYN patient.   OBJECTIVE:  There were no vitals taken for this visit.  Appears well, in no apparent distress  Injection administered in: Right deltoid per pt request  Meds ordered this encounter  Medications   medroxyPROGESTERone (DEPO-PROVERA) injection 150 mg    ASSESSMENT: GYN patient Depo Provera for contraception/period management PLAN: Follow-up: in 11-13 weeks for next Depo   Kenosha Doster A Matyas Baisley  01/31/2021 9:57 AM

## 2021-02-01 ENCOUNTER — Encounter: Payer: Self-pay | Admitting: Orthopedic Surgery

## 2021-02-01 NOTE — Progress Notes (Addendum)
Pt canceled while in office. She had to go pick up her daughter in [place], and she was not see by provider. She did reschedule on 01/18/21  Patient left without being seen.

## 2021-02-01 NOTE — Progress Notes (Signed)
Office Visit Note   Patient: Tracy [name]           Date of Birth: 05-26-1985           MRN: [id number] Visit Date: 01/09/2021              Requested by: No referring provider defined for this encounter. PCP: Patient, No Pcp Per (Inactive)  Chief Complaint  Patient presents with   Left Foot - Routine Post Op    10/25/20 left foot debridement       HPI: Patient left without being seen.  Assessment & Plan: Visit Diagnoses:  1. Pain in left foot     Plan: Follow-up when convenient for patient  Follow-Up Instructions: Return if symptoms worsen or fail to improve.   Ortho Exam  Patient is alert, oriented, no adenopathy, well-dressed, normal affect, normal respiratory effort. Neuro exam.  Imaging: No results found. No images are attached to the encounter.  Labs: Lab Results  Component Value Date   HGBA1C 6.0 (H) 09/19/2020   HGBA1C 8.9 (H) 08/10/2014   HGBA1C 16.8 (H) 01/20/2013   ESRSEDRATE 93 (H) 09/19/2020   ESRSEDRATE 12 11/09/2014   ESRSEDRATE 112 (H) 08/09/2014   CRP 25.7 (H) 09/19/2020   CRP <0.5 11/09/2014   CRP 28.5 (H) 08/09/2014   REPTSTATUS 10/31/2020 FINAL 10/25/2020   GRAMSTAIN  10/25/2020    RARE WBC PRESENT, PREDOMINANTLY MONONUCLEAR NO ORGANISMS SEEN    CULT  10/25/2020    No growth aerobically or anaerobically. Performed at [facility], [place] [address], [place], [place] [address]    LABORGA STAPHYLOCOCCUS AUREUS 09/19/2020     Lab Results  Component Value Date   ALBUMIN 2.2 (L) 09/23/2020   ALBUMIN 2.3 (L) 09/20/2020   ALBUMIN 3.2 (L) 07/28/2020    Lab Results  Component Value Date   MG 2.0 08/09/2014   MG 2.3 01/25/2014   MG 1.6 01/22/2014   No results found for: VD25OH  No results found for: PREALBUMIN CBC EXTENDED Latest Ref Rng & Units 10/25/2020 09/23/2020 07/28/2020  WBC 4.0 - 10.5 K/uL 8.1 9.1 7.3  RBC 3.87 - 5.11 MIL/uL 3.83(L) 3.16(L) 4.40  HGB 12.0 - 15.0 g/dL 10.8(L) 8.5(L) 12.4  HCT 36.0 - 46.0 %  34.8(L) 28.4(L) 40.2  PLT 150 - 400 K/uL 190 246 219  NEUTROABS 1.7 - 7.7 K/uL - - 5.5  LYMPHSABS 0.7 - 4.0 K/uL - - 1.1     There is no height or weight on file to calculate BMI.  Orders:  No orders of the defined types were placed in this encounter.  No orders of the defined types were placed in this encounter.    Procedures: No procedures performed  Clinical Data: No additional findings.  ROS:  All other systems negative, except as noted in the HPI. Review of Systems  Objective: Vital Signs: There were no vitals taken for this visit.  Specialty Comments:  No specialty comments available.  PMFS History: Patient Active Problem List   Diagnosis Date Noted   Port-A-Cath in place 11/22/2020   Pyogenic inflammation of bone ([place]) 41/66/0630   Hardware complicating wound infection ([place]) 10/04/2020   Medication monitoring encounter 10/04/2020   Abscess of left foot 09/20/2020   Osteomyelitis of foot, left, acute ([place]) 09/20/2020   Cellulitis of left foot 09/19/2020   ASCUS with positive high risk human papillomavirus of vagina 05/01/2020   Abnormal uterine bleeding (AUB) 04/19/2020   History of abnormal cervical Pap smear  10/06/2017   Encounter for gynecological examination with Papanicolaou smear of cervix 10/06/2017   MDD (major depressive disorder), recurrent episode, moderate ([place]) 03/19/2017   Atypical squamous cell changes of undetermined significance (ASCUS) on vaginal cytology with positive high risk human papilloma virus (HPV) 09/05/2016   Encounter for surveillance of injectable contraceptive 08/30/2016   Urinary tract infection with hematuria 06/05/2016   Bad odor of urine 06/05/2016   Irregular intermenstrual bleeding 01/18/2016   Anxiety 01/18/2016   History of organ transplantation 08/18/2015   Abscess of calf 12/21/2014   Escherichia coli (E. 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, not stated as uncontrolled 10/16/2012   Hypothyroidism 10/15/2012   Proliferative diabetic retinopathy ([place]) 09/27/2011   Past Medical History:  Diagnosis Date   Abscess of left foot 09/20/2020   Abscess of right thigh    Anemia    presently on iron supplement   Anxiety    ASCUS with positive high risk human papillomavirus of vagina 05/01/2020   04/2020 ASCUS +HPV 16 and other will get Colpo    Atypical squamous cell changes of undetermined significance (ASCUS) on vaginal cytology with positive high risk human papilloma virus (HPV) 09/05/2016   Get colpo   Chronic kidney disease    on dialysis T, Th, Sat-  2017 [place] [facility]   CMV (cytomegalovirus infection) ([place])    Complication of anesthesia    Depression    Detached retina    Diabetes mellitus without complication ([place])    Type 1- had a pancreas transplant 2017   History of organ transplantation 08/18/2015   Kidney and Pancreates   HSV-1 (herpes simplex virus 1) infection    HSV-2 (herpes simplex virus 2) infection    Hypertension    Hypothyroidism    Irregular periods 07/05/2014   Neuropathy    in feet   Neuropathy    Osteomyelitis of left foot ([place]) 09/20/2020   PONV (postoperative nausea and vomiting)    Seizures ([place]) 01/2014   ? due to blood sugar   Thyroid enlargement    "not on medication at this time"   Urinary tract infection    hx of   Vaginal discharge 07/05/2014   Vaginal Pap smear, abnormal    Yeast infection    took diflucan saturday     Family History  Problem Relation Age of Onset   Cancer Paternal Grandfather        prostate   Hyperlipidemia Paternal Grandfather    Stroke  Paternal Grandfather     Past Surgical History:  Procedure Laterality Date   APPLICATION OF A-CELL OF EXTREMITY Right 10/03/2014   Procedure: APPLICATION OF A-CELL OF RIGHT UPPER THIGH WOUND;  Surgeon: Renette Butters, MD;  Location: [facility];  Service: Orthopedics;  Laterality: Right;   APPLICATION OF WOUND VAC Right 10/03/2014   Procedure: APPLICATION OF WOUND VAC RIGHT THIGH;  Surgeon: Renette Butters, MD;  Location: [facility];  Service: Orthopedics;  Laterality: Right;   APPLICATION OF WOUND VAC  10/25/2020   Procedure: APPLICATION OF WOUND VAC;  Surgeon: Newt Minion, MD;  Location: [facility];  Service: Orthopedics;;  AV FISTULA PLACEMENT Left 09/01/2014   Procedure: BRACHIOCEPHALIC ARTERIOVENOUS (AV) FISTULA CREATION;  Surgeon: Conrad Perkins, MD;  Location: [facility];  Service: Vascular;  Laterality: Left;   CHOLECYSTECTOMY     COMBINED KIDNEY-PANCREAS TRANSPLANT  05/15/15   I & D EXTREMITY Right 08/14/2014   Procedure: IRRIGATION AND DEBRIDEMENT EXTREMITY WITH MUSCLE BIOPSY;  Surgeon: Roseanne Kaufman, MD;  Location: [facility];  Service: Orthopedics;  Laterality: Right;   I & D EXTREMITY Right 08/20/2014   Procedure:  I AND D LEG / THIGH ABSCESS AND MANIPULATION OF RIGHT ELBOW.;  Surgeon: Renette Butters, MD;  Location: [facility];  Service: Orthopedics;  Laterality: Right;   I & D EXTREMITY Right 09/30/2014   Procedure: IRRIGATION AND DEBRIDEMENT RIGHT THIGH ABSCESS;  Surgeon: [name], MD;  Location: [facility];  Service: Orthopedics;  Laterality: Right;   I & D EXTREMITY Right 10/03/2014   Procedure: IRRIGATION AND DEBRIDEMENT RIGHT THIGH ABSCESS,WITH WOUND CLOSURE & MANIPULATION OF RIGHT KNEE;  Surgeon: Renette Butters, MD;  Location: [facility];  Service: Orthopedics;  Laterality: Right;   I & D EXTREMITY Left 09/19/2020   Procedure: IRRIGATION AND DEBRIDEMENT EXTREMITY;  Surgeon: Altamese Platte, MD;  Location: [facility];  Service: Orthopedics;  Laterality: Left;   I & D EXTREMITY Left 09/21/2020   Procedure: IRRIGATION  AND DEBRIDEMENT EXTREMITY, partial excision bone 2nd metatarsal;  Surgeon: Altamese [place], MD;  Location: [facility];  Service: Orthopedics;  Laterality: Left;   I & D EXTREMITY Left 10/25/2020   Procedure: LEFT FOOT DEBRIDEMENT;  Surgeon: Newt Minion, MD;  Location: [facility];  Service: Orthopedics;  Laterality: Left;   INCISION AND DRAINAGE ABSCESS Right 12/09/2014   Procedure: INCISION AND DRAINAGE ABSCESS;  Surgeon: Renette Butters, MD;  Location: [facility];  Service: Orthopedics;  Laterality: Right;   INSERTION OF DIALYSIS CATHETER N/A 09/01/2014   Procedure: INSERTION OF DIALYSIS CATHETER IN RIGHT INTERNAL JUGUILAR;  Surgeon: Conrad Eddy, MD;  Location: [facility];  Service: Vascular;  Laterality: N/A;   KIDNEY TRANSPLANT  05/15/15   OPEN REDUCTION INTERNAL FIXATION (ORIF) FOOT LISFRANC FRACTURE Left 07/28/2020   Procedure: OPEN REDUCTION INTERNAL FIXATION (ORIF) FOOT LISFRANC FRACTURE;  Surgeon: Altamese [place], MD;  Location: [facility];  Service: Orthopedics;  Laterality: Left;   ORIF TOE FRACTURE Left 07/28/2020   Procedure: OPEN REDUCTION INTERNAL FIXATION (ORIF) METATARSAL (TOE) FRACTURE;  Surgeon: Altamese Motley, MD;  Location: [facility];  Service: Orthopedics;  Laterality: Left;   PARS PLANA VITRECTOMY  10/01/2011   Procedure: PARS PLANA VITRECTOMY WITH 25 GAUGE;  Surgeon: Hayden Pedro, MD;  Location: [facility];  Service: Ophthalmology;  Laterality: Right;  Repair Complex Traction Retinal Detachment   PERCUTANEOUS PINNING Left 07/28/2020   Procedure: PERCUTANEOUS PINNING  2ND AND 3RD METATARSALS;  Surgeon: Altamese Uvalda, MD;  Location: [facility];  Service: Orthopedics;  Laterality: Left;   PILONIDAL CYST EXCISION     TRIGGER FINGER RELEASE Right 09/21/13   WISDOM TOOTH EXTRACTION     Social History   Occupational History    Employer: [employer]  Tobacco Use   Smoking status: Never   Smokeless tobacco: Never  Vaping Use   Vaping Use: Never used  Substance and Sexual Activity   Alcohol use: No    Alcohol/week: 0.0  standard drinks   Drug use: No   Sexual activity: Yes    Birth control/protection: Injection

## 2021-02-06 ENCOUNTER — Encounter (HOSPITAL_COMMUNITY): Payer: Self-pay | Admitting: Physical Therapy

## 2021-02-06 NOTE — Therapy (Signed)
[facility] [place], [place], [address] Phone: [phone]   Fax:  [phone]  Patient Details  Name: Tracy [name] MRN: [id number] Date of Birth: 12-05-1985 Referring Provider:  No ref. provider found  Encounter Date: 02/06/2021  PHYSICAL THERAPY DISCHARGE SUMMARY  Visits from Start of Care: 4  Current functional level related to goals / functional outcomes: unknown   Remaining deficits: unknown   Education / Equipment: HEP   Patient agrees to discharge. Patient goals were partially met. Patient is being discharged due to not returning since the last visit. [address], [place] CLT [phone]  02/06/2021, 9:50 AM  [facility] [address] [place], [place], [address] Phone: [phone]   Fax:  [phone]

## 2021-02-13 ENCOUNTER — Other Ambulatory Visit: Payer: Self-pay | Admitting: Adult Health

## 2021-03-02 ENCOUNTER — Ambulatory Visit: Payer: Self-pay | Admitting: Allergy & Immunology

## 2021-04-25 ENCOUNTER — Other Ambulatory Visit: Payer: Self-pay

## 2021-04-25 ENCOUNTER — Ambulatory Visit (INDEPENDENT_AMBULATORY_CARE_PROVIDER_SITE_OTHER): Payer: BC Managed Care – PPO | Admitting: *Deleted

## 2021-04-25 DIAGNOSIS — Z3042 Encounter for surveillance of injectable contraceptive: Secondary | ICD-10-CM

## 2021-04-25 MED ORDER — MEDROXYPROGESTERONE ACETATE 150 MG/ML IM SUSP
150.0000 mg | Freq: Once | INTRAMUSCULAR | Status: AC
  Administered 2021-04-25: 150 mg via INTRAMUSCULAR

## 2021-04-25 NOTE — Progress Notes (Signed)
? ?  NURSE VISIT- INJECTION ? ?SUBJECTIVE:  ?Tracy [name] is a 36 y.o. G27P0020 female here for a Depo Provera for contraception/period management. She is a GYN patient.  ? ?OBJECTIVE:  ?There were no vitals taken for this visit.  ?Appears well, in no apparent distress ? ?Injection administered in: Right deltoid ? ?Meds ordered this encounter  ?Medications  ? medroxyPROGESTERone (DEPO-PROVERA) injection 150 mg  ? ? ?ASSESSMENT: ?GYN patient Depo Provera for contraception/period management ?PLAN: ?Follow-up: in 11-13 weeks for next Depo; schedule pap and physical.  ? ?Levy Pupa  ?04/25/2021 ?9:41 AM ? ?

## 2021-05-16 ENCOUNTER — Encounter: Payer: Self-pay | Admitting: Adult Health

## 2021-05-16 ENCOUNTER — Ambulatory Visit (INDEPENDENT_AMBULATORY_CARE_PROVIDER_SITE_OTHER): Payer: BC Managed Care – PPO | Admitting: Adult Health

## 2021-05-16 ENCOUNTER — Other Ambulatory Visit (HOSPITAL_COMMUNITY)
Admission: RE | Admit: 2021-05-16 | Discharge: 2021-05-16 | Disposition: A | Discharge status: Home / Self Care | Payer: BC Managed Care – PPO | Source: Ambulatory Visit | Attending: Adult Health | Admitting: Adult Health

## 2021-05-16 VITALS — BP 156/85 | HR 80 | Ht 64.0 in | Wt 173.0 lb

## 2021-05-16 DIAGNOSIS — Z8742 Personal history of other diseases of the female genital tract: Secondary | ICD-10-CM | POA: Diagnosis not present

## 2021-05-16 DIAGNOSIS — Z01419 Encounter for gynecological examination (general) (routine) without abnormal findings: Secondary | ICD-10-CM

## 2021-05-16 DIAGNOSIS — Z3042 Encounter for surveillance of injectable contraceptive: Secondary | ICD-10-CM

## 2021-05-16 DIAGNOSIS — F419 Anxiety disorder, unspecified: Secondary | ICD-10-CM

## 2021-05-16 MED ORDER — LORAZEPAM 0.5 MG PO TABS: 0.5000 mg | ORAL_TABLET | Freq: Three times a day (TID) | ORAL | 0 refills | Status: AC | PRN

## 2021-05-16 MED ORDER — TRAZODONE HCL 100 MG PO TABS: 100.0000 mg | ORAL_TABLET | Freq: Every day | ORAL | 12 refills | Status: DC

## 2021-05-16 MED ORDER — BUSPIRONE HCL 5 MG PO TABS: 5.0000 mg | ORAL_TABLET | Freq: Three times a day (TID) | ORAL | 3 refills | Status: AC

## 2021-05-16 MED ORDER — MEDROXYPROGESTERONE ACETATE 150 MG/ML IM SUSP: 150.0000 mg | INTRAMUSCULAR | 4 refills | Status: DC

## 2021-05-16 NOTE — Progress Notes (Signed)
Patient ID: Tracy [name], female   DOB: 03/21/1985, 36 y.o.   MRN: [id number] ?History of Present Illness: ? ?Tracy [name] is a 36 year old white female, married, G2P0020, in for a well woman gyn exam and pap. Her pap 04/19/20 was ASCUS +HPV,other  and +HPV 16.Had colpo 05/31/20, showing CIN 1 or normal results  ? ?Current Medications, Allergies, Past Medical History, Past Surgical History, Family History and Social History were reviewed in [employer] record.   ? ? ?Review of Systems: ?Patient denies any headaches, hearing loss, fatigue, blurred vision, shortness of breath, chest pain, abdominal pain, problems with bowel movements, urination, or intercourse. No joint pain or mood swings.  ?Had some pain with sex on entrance and decrease sex drive., try to have more frequent and use good lubricate. ?+easily agitated and anxious  ?Trazodine helps wih  ? ?Physical Exam:BP (!) 156/85 (BP Location: Right Arm, Patient Position: Sitting, Cuff Size: Normal)   Pulse 80   Ht 5\' 4"  (1.626 m)   Wt 173 lb (78.5 kg)   BMI 29.70 kg/m?   ?General:  Well developed, well nourished, no acute distress ?Skin:  Warm and dry ?Neck:  Midline trachea, normal thyroid, good ROM, no lymphadenopathy ?Lungs; Clear to auscultation bilaterally ?Breast:  No dominant palpable mass, retraction, or nipple discharge, she has port a cath right upper chest for transfusion for transplant meds  ?Cardiovascular: Regular rate and rhythm ?Abdomen:  Soft, non tender, no hepatosplenomegaly ?Pelvic:  External genitalia is normal in appearance, no lesions.  The vagina is normal in appearance. Urethra has no lesions or masses. The cervix is smooth.Pap with HR HPV genotyping performed.   Uterus is felt to be normal size, shape, and contour.  No adnexal masses or tenderness noted.Bladder is non tender, no masses felt. ?Rectal: Deferred ?Extremities/musculoskeletal:  No swelling or varicosities noted, no clubbing or cyanosis, she has on support  hose, from fractured left foot last year,had  surgery, was slow to heal ?Psych:  No mood changes, alert and cooperative,seems happy ?AA is 1 ?Fall risk is high ? ?  05/16/2021  ?  9:45 AM 11/01/2020  ?  8:47 AM 04/19/2020  ?  3:03 PM  ?Depression screen PHQ 2/9  ?Decreased Interest 0 0 0  ?Down, Depressed, Hopeless 0 1 0  ?PHQ - 2 Score 0 1 0  ?Altered sleeping 2  1  ?Tired, decreased energy 1  1  ?Change in appetite 1  0  ?Feeling bad or failure about yourself  0  0  ?Trouble concentrating 0  0  ?Moving slowly or fidgety/restless 0  0  ?Suicidal thoughts 0  0  ?PHQ-9 Score 4  2  ?  ? ?  05/16/2021  ?  9:45 AM 04/19/2020  ?  3:04 PM  ?GAD 7 : Generalized Anxiety Score  ?Nervous, Anxious, on Edge 2 2  ?Control/stop worrying 2 1  ?Worry too much - different things 2 1  ?Trouble relaxing 2 1  ?Restless 2 1  ?Easily annoyed or irritable 3 1  ?Afraid - awful might happen 0 0  ?Total GAD 7 Score 13 7  ? ? [place] - 05/16/21 0941   ? ?  ? Pregnancy Intention Screening  ? Does the patient want to become pregnant in the next year? No   ? Does the patient's partner want to become pregnant in the next year? No   ? Would the patient like to discuss contraceptive options today? No   ?  ?  Contraception Wrap Up  ? Current Method Hormonal Injection   ? End Method Hormonal Injection   ? ?  ?  ? ?  ? Co exam with Balinda Quails NP student  ?  ? ?Impression and Plan: ?1. Encounter for gynecological examination with Papanicolaou smear of cervix ?Pap sent ?Physical in 1 year ?Pap in 3 years of normal ? ? ?2. History of abnormal cervical Pap smear ?Pap sent ? ?3. Encounter for surveillance of injectable contraceptive ?Will continue depo  ? ?4. Anxiety ?Will Rx Buspar 5 mg 1 tid for anxiety ?Will increased trazodone to 100 mg at bedtime ?Will refill ativan ? ?Meds ordered this encounter  ?Medications  ? traZODone (DESYREL) 100 MG tablet  ?  Sig: Take 1 tablet (100 mg total) by mouth at bedtime.  ?  Dispense:  30 tablet  ?  Refill:  12  ?  Order  Specific Question:   Supervising Provider  ?  Answer:   Tania Ade H [[id number]]  ? medroxyPROGESTERone (DEPO-PROVERA) 150 MG/ML injection  ?  Sig: Inject 1 mL (150 mg total) into the muscle every 3 (three) months.  ?  Dispense:  1 mL  ?  Refill:  4  ?  Order Specific Question:   Supervising Provider  ?  Answer:   Tania Ade H [[id number]]  ? LORazepam (ATIVAN) 0.5 MG tablet  ?  Sig: Take 1 tablet (0.5 mg total) by mouth every 8 (eight) hours as needed for anxiety.  ?  Dispense:  30 tablet  ?  Refill:  0  ?  Order Specific Question:   Supervising Provider  ?  Answer:   Tania Ade H [[id number]]  ? busPIRone (BUSPAR) 5 MG tablet  ?  Sig: Take 1 tablet (5 mg total) by mouth 3 (three) times daily.  ?  Dispense:  90 tablet  ?  Refill:  3  ?  Order Specific Question:   Supervising Provider  ?  Answer:   Tania Ade H [[id number]]  ?  ?Has depo scheduled for 07/18/21 and will see me after for ROS ? ? ? ?  ?  ?

## 2021-05-18 LAB — CYTOLOGY - PAP
Comment: NEGATIVE
Comment: NEGATIVE
Comment: NEGATIVE
Diagnosis: UNDETERMINED — AB
HPV 16: POSITIVE — AB
HPV 18 / 45: NEGATIVE
High risk HPV: POSITIVE — AB

## 2021-05-21 ENCOUNTER — Telehealth: Payer: Self-pay | Admitting: Adult Health

## 2021-05-21 ENCOUNTER — Encounter: Payer: Self-pay | Admitting: Adult Health

## 2021-05-21 DIAGNOSIS — R87610 Atypical squamous cells of undetermined significance on cytologic smear of cervix (ASC-US): Secondary | ICD-10-CM

## 2021-05-21 HISTORY — DX: Atypical squamous cells of undetermined significance on cytologic smear of cervix (ASC-US): R87.610

## 2021-05-21 NOTE — Telephone Encounter (Signed)
Pt aware that pap was ASCUS +HPV other and 16, she will call office for colp appt ?

## 2021-06-25 NOTE — Telephone Encounter (Signed)
err

## 2021-07-18 ENCOUNTER — Ambulatory Visit: Payer: BC Managed Care – PPO

## 2021-07-20 ENCOUNTER — Ambulatory Visit: Payer: BC Managed Care – PPO

## 2021-07-23 ENCOUNTER — Ambulatory Visit (INDEPENDENT_AMBULATORY_CARE_PROVIDER_SITE_OTHER): Payer: BC Managed Care – PPO | Admitting: *Deleted

## 2021-07-23 DIAGNOSIS — Z3042 Encounter for surveillance of injectable contraceptive: Secondary | ICD-10-CM

## 2021-07-23 MED ORDER — MEDROXYPROGESTERONE ACETATE 150 MG/ML IM SUSP
150.0000 mg | Freq: Once | INTRAMUSCULAR | Status: AC
  Administered 2021-07-23: 150 mg via INTRAMUSCULAR

## 2021-07-23 NOTE — Progress Notes (Addendum)
   NURSE VISIT- INJECTION  SUBJECTIVE:  Tracy [name] is a 36 y.o. G21P0020 female here for a Depo Provera for contraception/period management. She is a GYN patient.   OBJECTIVE:  There were no vitals taken for this visit.  Appears well, in no apparent distress  Injection administered in: Right deltoid  Meds ordered this encounter  Medications   medroxyPROGESTERone (DEPO-PROVERA) injection 150 mg    ASSESSMENT: GYN patient Depo Provera for contraception/period management PLAN: Follow-up: in 11-13 weeks for next Depo   Levy Pupa  07/23/2021 3:18 PM  Chart reviewed for nurse visit. Agree with plan of care.  Christin Fudge, [place] 07/23/2021 4:02 PM

## 2021-10-10 ENCOUNTER — Ambulatory Visit: Payer: BC Managed Care – PPO

## 2021-10-17 ENCOUNTER — Ambulatory Visit: Payer: BC Managed Care – PPO

## 2021-10-19 ENCOUNTER — Ambulatory Visit (INDEPENDENT_AMBULATORY_CARE_PROVIDER_SITE_OTHER): Payer: BC Managed Care – PPO | Admitting: *Deleted

## 2021-10-19 DIAGNOSIS — Z3042 Encounter for surveillance of injectable contraceptive: Secondary | ICD-10-CM | POA: Diagnosis not present

## 2021-10-19 MED ORDER — MEDROXYPROGESTERONE ACETATE 150 MG/ML IM SUSP
150.0000 mg | Freq: Once | INTRAMUSCULAR | Status: AC
  Administered 2021-10-19: 150 mg via INTRAMUSCULAR

## 2021-10-19 NOTE — Progress Notes (Signed)
   NURSE VISIT- INJECTION  SUBJECTIVE:  Tracy [name] is a 36 y.o. G70P0020 female here for a Depo Provera for contraception/period management. She is a GYN patient.   OBJECTIVE:  There were no vitals taken for this visit.  Appears well, in no apparent distress  Injection administered in: Right deltoid  Meds ordered this encounter  Medications   medroxyPROGESTERone (DEPO-PROVERA) injection 150 mg    ASSESSMENT: GYN patient Depo Provera for contraception/period management PLAN: Follow-up: in 11-13 weeks for next Depo   Alice Rieger  10/19/2021 9:16 AM

## 2022-01-09 ENCOUNTER — Ambulatory Visit (INDEPENDENT_AMBULATORY_CARE_PROVIDER_SITE_OTHER): Payer: BC Managed Care – PPO

## 2022-01-09 DIAGNOSIS — Z3042 Encounter for surveillance of injectable contraceptive: Secondary | ICD-10-CM

## 2022-01-09 MED ORDER — MEDROXYPROGESTERONE ACETATE 150 MG/ML IM SUSP
150.0000 mg | Freq: Once | INTRAMUSCULAR | Status: AC
  Administered 2022-01-09: 150 mg via INTRAMUSCULAR

## 2022-01-09 NOTE — Progress Notes (Signed)
   NURSE VISIT- INJECTION  SUBJECTIVE:  Tracy [name] is a 36 y.o. G29P0020 female here for a Depo Provera for contraception/period management. She is a GYN patient.   OBJECTIVE:  There were no vitals taken for this visit.  Appears well, in no apparent distress  Injection administered in: Right deltoid  Meds ordered this encounter  Medications   medroxyPROGESTERone (DEPO-PROVERA) injection 150 mg    ASSESSMENT: GYN patient Depo Provera for contraception/period management PLAN: Follow-up: in 11-13 weeks for next Depo   Andrez Grime  01/09/2022 9:38 AM

## 2022-03-27 ENCOUNTER — Ambulatory Visit: Payer: BC Managed Care – PPO

## 2022-04-09 ENCOUNTER — Ambulatory Visit (INDEPENDENT_AMBULATORY_CARE_PROVIDER_SITE_OTHER): Payer: BC Managed Care – PPO | Admitting: *Deleted

## 2022-04-09 DIAGNOSIS — Z3042 Encounter for surveillance of injectable contraceptive: Secondary | ICD-10-CM

## 2022-04-09 MED ORDER — MEDROXYPROGESTERONE ACETATE 150 MG/ML IM SUSP
150.0000 mg | Freq: Once | INTRAMUSCULAR | Status: AC
  Administered 2022-04-09: 150 mg via INTRAMUSCULAR

## 2022-04-09 NOTE — Progress Notes (Signed)
   NURSE VISIT- INJECTION  SUBJECTIVE:  Tracy [name] is a 37 y.o. G85P0020 female here for a Depo Provera for contraception/period management. She is a GYN patient.   OBJECTIVE:  There were no vitals taken for this visit.  Appears well, in no apparent distress  Injection administered in: Right deltoid  Meds ordered this encounter  Medications   medroxyPROGESTERone (DEPO-PROVERA) injection 150 mg    ASSESSMENT: GYN patient Depo Provera for contraception/period management PLAN: Follow-up: in 11-13 weeks for next Depo   Alice Rieger  04/09/2022 4:40 PM

## 2022-05-21 ENCOUNTER — Ambulatory Visit: Payer: BC Managed Care – PPO | Admitting: Adult Health

## 2022-06-11 ENCOUNTER — Other Ambulatory Visit: Payer: BC Managed Care – PPO

## 2022-06-12 ENCOUNTER — Other Ambulatory Visit: Payer: 59

## 2022-06-17 ENCOUNTER — Emergency Department (HOSPITAL_COMMUNITY)
Admission: EM | Admit: 2022-06-17 | Discharge: 2022-06-17 | Disposition: A | Discharge status: Home / Self Care | Payer: 59 | Attending: Emergency Medicine | Admitting: Emergency Medicine

## 2022-06-17 ENCOUNTER — Encounter (HOSPITAL_COMMUNITY): Payer: Self-pay | Admitting: *Deleted

## 2022-06-17 ENCOUNTER — Other Ambulatory Visit: Payer: Self-pay

## 2022-06-17 DIAGNOSIS — N186 End stage renal disease: Secondary | ICD-10-CM | POA: Diagnosis not present

## 2022-06-17 DIAGNOSIS — Z7982 Long term (current) use of aspirin: Secondary | ICD-10-CM | POA: Diagnosis not present

## 2022-06-17 DIAGNOSIS — I10 Essential (primary) hypertension: Secondary | ICD-10-CM

## 2022-06-17 DIAGNOSIS — Z992 Dependence on renal dialysis: Secondary | ICD-10-CM | POA: Insufficient documentation

## 2022-06-17 DIAGNOSIS — E1022 Type 1 diabetes mellitus with diabetic chronic kidney disease: Secondary | ICD-10-CM | POA: Diagnosis not present

## 2022-06-17 DIAGNOSIS — I120 Hypertensive chronic kidney disease with stage 5 chronic kidney disease or end stage renal disease: Secondary | ICD-10-CM | POA: Diagnosis present

## 2022-06-17 LAB — BASIC METABOLIC PANEL
Anion gap: 11 (ref 5–15)
BUN: 32 mg/dL — ABNORMAL HIGH (ref 6–20)
CO2: 15 mmol/L — ABNORMAL LOW (ref 22–32)
Calcium: 8.8 mg/dL — ABNORMAL LOW (ref 8.9–10.3)
Chloride: 112 mmol/L — ABNORMAL HIGH (ref 98–111)
Creatinine, Ser: 2.08 mg/dL — ABNORMAL HIGH (ref 0.44–1.00)
GFR, Estimated: 31 mL/min — ABNORMAL LOW (ref >60)
Glucose, Bld: 92 mg/dL (ref 70–99)
Potassium: 4.2 mmol/L (ref 3.5–5.1)
Sodium: 138 mmol/L (ref 135–145)

## 2022-06-17 LAB — CBC WITH DIFFERENTIAL/PLATELET
Abs Immature Granulocytes: 0.02 10*3/uL (ref 0.00–0.07)
Basophils Absolute: 0 10*3/uL (ref 0.0–0.1)
Basophils Relative: 1 %
Eosinophils Absolute: 0.1 10*3/uL (ref 0.0–0.5)
Eosinophils Relative: 2 %
HCT: 32.8 % — ABNORMAL LOW (ref 36.0–46.0)
Hemoglobin: 10.2 g/dL — ABNORMAL LOW (ref 12.0–15.0)
Immature Granulocytes: 0 %
Lymphocytes Relative: 20 %
Lymphs Abs: 1 10*3/uL (ref 0.7–4.0)
MCH: 28.6 pg (ref 26.0–34.0)
MCHC: 31.1 g/dL (ref 30.0–36.0)
MCV: 91.9 fL (ref 80.0–100.0)
Monocytes Absolute: 0.4 10*3/uL (ref 0.1–1.0)
Monocytes Relative: 7 %
Neutro Abs: 3.5 10*3/uL (ref 1.7–7.7)
Neutrophils Relative %: 70 %
Platelets: 180 10*3/uL (ref 150–400)
RBC: 3.57 MIL/uL — ABNORMAL LOW (ref 3.87–5.11)
RDW: 14.3 % (ref 11.5–15.5)
WBC: 5.1 10*3/uL (ref 4.0–10.5)
nRBC: 0 % (ref 0.0–0.2)

## 2022-06-17 MED ORDER — LIDOCAINE-PRILOCAINE 2.5-2.5 % EX CREA
TOPICAL_CREAM | Freq: Once | CUTANEOUS | Status: AC
  Administered 2022-06-17: 1 via TOPICAL
  Filled 2022-06-17: qty 5

## 2022-06-17 NOTE — Discharge Instructions (Signed)
Your blood pressure today was elevated.  Please start the amlodipine today.  Maintain a log of your blood pressure readings once daily.  Follow-up with your nephrologist for recheck.  Return to emergency department for any new or worsening symptoms.

## 2022-06-17 NOTE — ED Provider Notes (Signed)
[place] EMERGENCY DEPARTMENT AT [facility] Provider Note   CSN: [id number] Arrival date & time: 06/17/22  1318     History {Add pertinent medical, surgical, social history, OB history to HPI:1} Chief Complaint  Patient presents with   Hypertension    Tracy [name] is a 37 y.o. female.   Hypertension       Tracy [name] is a 37 y.o. female past medical history of type 1 diabetes, end-stage renal disease on dialysis, hypertension, seizure and history of kidney and pancreas transplant, followed at [facility] who presents to the Emergency Department requesting evaluation for high blood pressure.  States that her blood pressure has been elevated for 1 month.  Intermittent headaches.  Pressure 190s over 100 on Friday evening.  Has intermittent episodes of feeling palpitations especially when her blood pressure is elevated.  She is unsure if this is anxiety related.  Denies any chest pain.  States she contacted her nephrologist   Home Medications Prior to Admission medications   Medication Sig Start Date End Date Taking? Authorizing Provider  aspirin EC 325 MG tablet Take 1 tablet (325 mg total) by mouth daily. 09/23/20   Montez Morita, PA-C  busPIRone (BUSPAR) 5 MG tablet Take 1 tablet (5 mg total) by mouth 3 (three) times daily. 05/16/21   Adline Potter, NP  dapsone 25 MG tablet Take 50 mg by mouth daily. 04/27/21   [provider]  fluconazole (DIFLUCAN) 150 MG tablet Take 1 now and 1 in 3 days 11/29/20   Cyril Mourning A, NP  lidocaine-prilocaine (EMLA) cream Apply 1 application topically as needed. Port access 09/15/20   [provider]  LORazepam (ATIVAN) 0.5 MG tablet Take 1 tablet (0.5 mg total) by mouth every 8 (eight) hours as needed for anxiety. 05/16/21   Adline Potter, NP  medroxyPROGESTERone (DEPO-PROVERA) 150 MG/ML injection Inject 1 mL (150 mg total) into the muscle every 3 (three) months. 05/16/21   Adline Potter, NP   mycophenolate (MYFORTIC) 180 MG EC tablet Take 540 mg by mouth 2 (two) times daily. 08/09/15   [provider]  predniSONE (DELTASONE) 10 MG tablet Take 10 mg by mouth daily with breakfast.    [provider]  sodium bicarbonate 650 MG tablet Take 1 tablet by mouth 2 (two) times daily. 10/31/20   [provider]  tacrolimus (PROGRAF) 1 MG capsule Take 2 mg by mouth 2 (two) times daily. 08/09/15   [provider]  traZODone (DESYREL) 100 MG tablet Take 1 tablet (100 mg total) by mouth at bedtime. 05/16/21   Adline Potter, NP  valACYclovir (VALTREX) 1000 MG tablet Take 2 at sign of fever blister and 2 the next day 11/29/20   Adline Potter, NP  valGANciclovir (VALCYTE) 450 MG tablet Take 900 mg by mouth daily. 04/27/21   [provider]      Allergies    Penicillins and Sulfa antibiotics    Review of Systems   Review of Systems  Physical Exam Updated Vital Signs BP (!) 162/90 (BP Location: Right Arm)   Pulse 79   Temp 98.3 F (36.8 C)   Resp 18   Ht 5\' 4"  (1.626 m)   Wt 79.4 kg   SpO2 100%   BMI 30.04 kg/m  Physical Exam  ED Results / Procedures / Treatments   Labs (all labs ordered are listed, but only abnormal results are displayed) Labs Reviewed  CBC WITH DIFFERENTIAL/PLATELET - Abnormal;  Notable for the following components:      Result Value   RBC 3.57 (*)    Hemoglobin 10.2 (*)    HCT 32.8 (*)    All other components within normal limits  BASIC METABOLIC PANEL - Abnormal; Notable for the following components:   Chloride 112 (*)    CO2 15 (*)    BUN 32 (*)    Creatinine, Ser 2.08 (*)    Calcium 8.8 (*)    GFR, Estimated 31 (*)    All other components within normal limits    EKG EKG Interpretation  Date/Time:  Monday Jun 17 2022 13:35:34 EDT Ventricular Rate:  79 PR Interval:  138 QRS Duration: 80 QT Interval:  376 QTC Calculation: 431 R Axis:   83 Text Interpretation: Normal sinus rhythm Normal ECG When  compared with ECG of 17-Jun-2022 13:35, No significant change was found Confirmed by Bethann Berkshire [phone]) on 06/17/2022 2:59:03 PM  Radiology No results found.  Procedures Procedures  {Document cardiac monitor, telemetry assessment procedure when appropriate:1}  Medications Ordered in ED Medications  lidocaine-prilocaine (EMLA) cream (1 Application Topical Given 06/17/22 1421)    ED Course/ Medical Decision Making/ A&P   {   Click here for ABCD2, HEART and other calculatorsREFRESH Note before signing :1}                          Medical Decision Making Amount and/or Complexity of Data Reviewed Labs: ordered.  Risk Prescription drug management.   ***  {Document critical care time when appropriate:1} {Document review of labs and clinical decision tools ie heart score, Chads2Vasc2 etc:1}  {Document your independent review of radiology images, and any outside records:1} {Document your discussion with family members, caretakers, and with consultants:1} {Document social determinants of health affecting pt's care:1} {Document your decision making why or why not admission, treatments were needed:1} Final Clinical Impression(s) / ED Diagnoses Final diagnoses:  None    Rx / DC Orders ED Discharge Orders     None

## 2022-06-17 NOTE — ED Triage Notes (Signed)
Pt with c/o hypertension x 4 weeks.  + HA. Pt with hx kidney and pancreas transplant.

## 2022-06-25 ENCOUNTER — Ambulatory Visit: Payer: BC Managed Care – PPO | Admitting: Adult Health

## 2022-06-26 ENCOUNTER — Other Ambulatory Visit: Payer: Self-pay | Admitting: Adult Health

## 2022-06-26 DIAGNOSIS — Z3042 Encounter for surveillance of injectable contraceptive: Secondary | ICD-10-CM

## 2022-06-27 ENCOUNTER — Ambulatory Visit: Payer: 59

## 2022-07-01 ENCOUNTER — Ambulatory Visit (INDEPENDENT_AMBULATORY_CARE_PROVIDER_SITE_OTHER): Payer: 59 | Admitting: *Deleted

## 2022-07-01 DIAGNOSIS — Z3042 Encounter for surveillance of injectable contraceptive: Secondary | ICD-10-CM | POA: Diagnosis not present

## 2022-07-01 MED ORDER — MEDROXYPROGESTERONE ACETATE 150 MG/ML IM SUSP
150.0000 mg | Freq: Once | INTRAMUSCULAR | Status: AC
Start: 2022-07-01 — End: 2022-07-01
  Administered 2022-07-01: 150 mg via INTRAMUSCULAR

## 2022-07-01 NOTE — Progress Notes (Signed)
   NURSE VISIT- INJECTION  SUBJECTIVE:  Tracy [name] is a 37 y.o. G59P0020 female here for a Depo Provera for contraception/period management. She is a GYN patient.   OBJECTIVE:  There were no vitals taken for this visit.  Appears well, in no apparent distress  Injection administered in: Right deltoid  Meds ordered this encounter  Medications   medroxyPROGESTERone (DEPO-PROVERA) injection 150 mg    ASSESSMENT: GYN patient Depo Provera for contraception/period management PLAN: Follow-up: in 11-13 weeks for next Depo   Annamarie Dawley  07/01/2022 2:02 PM

## 2022-09-24 ENCOUNTER — Ambulatory Visit (INDEPENDENT_AMBULATORY_CARE_PROVIDER_SITE_OTHER): Payer: 59

## 2022-09-24 DIAGNOSIS — Z3042 Encounter for surveillance of injectable contraceptive: Secondary | ICD-10-CM | POA: Diagnosis not present

## 2022-09-24 MED ORDER — MEDROXYPROGESTERONE ACETATE 150 MG/ML IM SUSY
150.0000 mg | PREFILLED_SYRINGE | Freq: Once | INTRAMUSCULAR | Status: AC
Start: 2022-09-24 — End: 2022-09-24
  Administered 2022-09-24: 150 mg via INTRAMUSCULAR

## 2022-09-24 NOTE — Progress Notes (Signed)
   NURSE VISIT- INJECTION  SUBJECTIVE:  Tracy [name] is a 37 y.o. G47P0020 female here for a Depo Provera for contraception/period management. She is a GYN patient.   OBJECTIVE:  There were no vitals taken for this visit.  Appears well, in no apparent distress  Injection administered in: Right deltoid  Meds ordered this encounter  Medications   medroxyPROGESTERone Acetate SUSY 150 mg    ASSESSMENT: GYN patient Depo Provera for contraception/period management PLAN: Follow-up: in 11-13 weeks for next Depo   Caralyn Guile  09/24/2022 3:44 PM

## 2022-11-13 ENCOUNTER — Encounter (INDEPENDENT_AMBULATORY_CARE_PROVIDER_SITE_OTHER): Payer: 59 | Admitting: Ophthalmology

## 2022-11-13 DIAGNOSIS — E103593 Type 1 diabetes mellitus with proliferative diabetic retinopathy without macular edema, bilateral: Secondary | ICD-10-CM | POA: Diagnosis not present

## 2022-11-13 DIAGNOSIS — H35372 Puckering of macula, left eye: Secondary | ICD-10-CM

## 2022-11-13 DIAGNOSIS — H43812 Vitreous degeneration, left eye: Secondary | ICD-10-CM

## 2022-12-17 ENCOUNTER — Ambulatory Visit: Payer: 59

## 2022-12-18 ENCOUNTER — Other Ambulatory Visit: Payer: Self-pay | Admitting: Adult Health

## 2022-12-18 ENCOUNTER — Ambulatory Visit (INDEPENDENT_AMBULATORY_CARE_PROVIDER_SITE_OTHER): Payer: 59 | Admitting: *Deleted

## 2022-12-18 DIAGNOSIS — Z3042 Encounter for surveillance of injectable contraceptive: Secondary | ICD-10-CM

## 2022-12-18 MED ORDER — VALACYCLOVIR HCL 1 G PO TABS: ORAL_TABLET | ORAL | 3 refills | Status: AC

## 2022-12-18 MED ORDER — MEDROXYPROGESTERONE ACETATE 150 MG/ML IM SUSY
150.0000 mg | PREFILLED_SYRINGE | Freq: Once | INTRAMUSCULAR | Status: AC
  Administered 2022-12-18: 150 mg via INTRAMUSCULAR

## 2022-12-18 NOTE — Progress Notes (Signed)
Refilled valtrex  

## 2022-12-18 NOTE — Progress Notes (Signed)
   NURSE VISIT- INJECTION  SUBJECTIVE:  Tracy [name] is a 38 y.o. G55P0020 female here for a Depo Provera for contraception/period management. She is a GYN patient.   OBJECTIVE:  There were no vitals taken for this visit.  Appears well, in no apparent distress  Injection administered in: Right deltoid  Meds ordered this encounter  Medications   medroxyPROGESTERone Acetate SUSY 150 mg    ASSESSMENT: GYN patient Depo Provera for contraception/period management PLAN: Follow-up: in 11-13 weeks for next Depo. Pt requested a refill on Valtrex 1000 mg.    Malachy Mood  12/18/2022 4:04 PM

## 2023-03-05 ENCOUNTER — Ambulatory Visit: Payer: Self-pay | Admitting: Obstetrics & Gynecology

## 2023-03-05 ENCOUNTER — Encounter: Payer: Self-pay | Admitting: Obstetrics & Gynecology

## 2023-03-05 VITALS — BP 186/88 | HR 71 | Ht 63.0 in | Wt 171.2 lb

## 2023-03-05 DIAGNOSIS — Z3042 Encounter for surveillance of injectable contraceptive: Secondary | ICD-10-CM

## 2023-03-05 DIAGNOSIS — Z940 Kidney transplant status: Secondary | ICD-10-CM | POA: Diagnosis not present

## 2023-03-05 DIAGNOSIS — N939 Abnormal uterine and vaginal bleeding, unspecified: Secondary | ICD-10-CM | POA: Diagnosis not present

## 2023-03-05 NOTE — Progress Notes (Signed)
GYN VISIT Patient name: Tracy [name] MRN [id number]  Date of birth: 10-10-1985 Chief Complaint:   Pre-op Exam  History of Present Illness:   Tracy [name] is a 38 y.o. G55P0020 female being seen today for AUB.     AUB:  Notes daily bleeding since Dec 2024 if not a little bit longer and notes this has been an ongoing issue.  Last discussed this concern with her in 2022.  Before she would have a little spotting right before she was due for Depot, but now it's heavier than that and more frequent.  A few days ago, bled through an overnight pad. Seems to come intermittently with large gushes of blood with clots.  Still currently on Depot.  In the past, she could use Megace to regulate her period, but that is no longer working. She wishes to review her options.  Last [place] 2022 appeared grossly normal   Review of Systems:   Pertinent items are noted in HPI Denies fever/chills, dizziness, headaches, visual disturbances, fatigue, shortness of breath, chest pain, abdominal pain, vomiting, no problems with bowel movements, urination, or intercourse unless otherwise stated above.  Pertinent History Reviewed:   Past Surgical History:  Procedure Laterality Date   APPLICATION OF A-CELL OF EXTREMITY Right 10/03/2014   Procedure: APPLICATION OF A-CELL OF RIGHT UPPER THIGH WOUND;  Surgeon: Sheral Apley, MD;  Location: [facility] OR;  Service: Orthopedics;  Laterality: Right;   APPLICATION OF WOUND VAC Right 10/03/2014   Procedure: APPLICATION OF WOUND VAC RIGHT THIGH;  Surgeon: Sheral Apley, MD;  Location: [facility] OR;  Service: Orthopedics;  Laterality: Right;   APPLICATION OF WOUND VAC  10/25/2020   Procedure: APPLICATION OF WOUND VAC;  Surgeon: Nadara Mustard, MD;  Location: [facility] OR;  Service: Orthopedics;;   AV FISTULA PLACEMENT Left 09/01/2014   Procedure: BRACHIOCEPHALIC ARTERIOVENOUS (AV) FISTULA CREATION;  Surgeon: Fransisco Hertz, MD;  Location: [facility] OR;  Service: Vascular;  Laterality: Left;   CHOLECYSTECTOMY      COMBINED KIDNEY-PANCREAS TRANSPLANT  05/15/15   I & D EXTREMITY Right 08/14/2014   Procedure: IRRIGATION AND DEBRIDEMENT EXTREMITY WITH MUSCLE BIOPSY;  Surgeon: Dominica Severin, MD;  Location: [facility] OR;  Service: Orthopedics;  Laterality: Right;   I & D EXTREMITY Right 08/20/2014   Procedure:  I AND D LEG / THIGH ABSCESS AND MANIPULATION OF RIGHT ELBOW.;  Surgeon: Sheral Apley, MD;  Location: [facility] OR;  Service: Orthopedics;  Laterality: Right;   I & D EXTREMITY Right 09/30/2014   Procedure: IRRIGATION AND DEBRIDEMENT RIGHT THIGH ABSCESS;  Surgeon: Teryl Lucy, MD;  Location: [facility] OR;  Service: Orthopedics;  Laterality: Right;   I & D EXTREMITY Right 10/03/2014   Procedure: IRRIGATION AND DEBRIDEMENT RIGHT THIGH ABSCESS,WITH WOUND CLOSURE & MANIPULATION OF RIGHT KNEE;  Surgeon: Sheral Apley, MD;  Location: [facility] OR;  Service: Orthopedics;  Laterality: Right;   I & D EXTREMITY Left 09/19/2020   Procedure: IRRIGATION AND DEBRIDEMENT EXTREMITY;  Surgeon: Myrene Galas, MD;  Location: [facility] OR;  Service: Orthopedics;  Laterality: Left;   I & D EXTREMITY Left 09/21/2020   Procedure: IRRIGATION AND DEBRIDEMENT EXTREMITY, partial excision bone 2nd metatarsal;  Surgeon: Myrene Galas, MD;  Location: [facility] OR;  Service: Orthopedics;  Laterality: Left;   I & D EXTREMITY Left 10/25/2020   Procedure: LEFT FOOT DEBRIDEMENT;  Surgeon: Nadara Mustard, MD;  Location: [facility] OR;  Service: Orthopedics;  Laterality: Left;   INCISION AND DRAINAGE ABSCESS Right 12/09/2014  Procedure: INCISION AND DRAINAGE ABSCESS;  Surgeon: Sheral Apley, MD;  Location: [facility] OR;  Service: Orthopedics;  Laterality: Right;   INSERTION OF DIALYSIS CATHETER N/A 09/01/2014   Procedure: INSERTION OF DIALYSIS CATHETER IN RIGHT INTERNAL JUGUILAR;  Surgeon: Fransisco Hertz, MD;  Location: [facility] OR;  Service: Vascular;  Laterality: N/A;   KIDNEY TRANSPLANT  05/15/15   OPEN REDUCTION INTERNAL FIXATION (ORIF) FOOT LISFRANC FRACTURE Left 07/28/2020   Procedure: OPEN  REDUCTION INTERNAL FIXATION (ORIF) FOOT LISFRANC FRACTURE;  Surgeon: Myrene Galas, MD;  Location: [facility] OR;  Service: Orthopedics;  Laterality: Left;   ORIF TOE FRACTURE Left 07/28/2020   Procedure: OPEN REDUCTION INTERNAL FIXATION (ORIF) METATARSAL (TOE) FRACTURE;  Surgeon: Myrene Galas, MD;  Location: [facility] OR;  Service: Orthopedics;  Laterality: Left;   PARS PLANA VITRECTOMY  10/01/2011   Procedure: PARS PLANA VITRECTOMY WITH 25 GAUGE;  Surgeon: Sherrie George, MD;  Location: [facility] OR;  Service: Ophthalmology;  Laterality: Right;  Repair Complex Traction Retinal Detachment   PERCUTANEOUS PINNING Left 07/28/2020   Procedure: PERCUTANEOUS PINNING  2ND AND 3RD METATARSALS;  Surgeon: Myrene Galas, MD;  Location: [facility] OR;  Service: Orthopedics;  Laterality: Left;   PILONIDAL CYST EXCISION     TRIGGER FINGER RELEASE Right 09/21/13   WISDOM TOOTH EXTRACTION      Past Medical History:  Diagnosis Date   Abscess of left foot 09/20/2020   Abscess of right thigh    Anemia    presently on iron supplement   Anxiety    ASCUS with positive high risk HPV cervical 05/21/2021   05/21/21 needs colpo   ASCUS with positive high risk human papillomavirus of vagina 05/01/2020   04/2020 ASCUS +HPV 16 and other will get Colpo    Atypical squamous cell changes of undetermined significance (ASCUS) on vaginal cytology with positive high risk human papilloma virus (HPV) 09/05/2016   Get colpo   Chronic kidney disease    on dialysis T, Th, Sat-  2017 [place] T[facility]   CMV (cytomegalovirus infection) (HCC)    Complication of anesthesia    Depression    Detached retina    Diabetes mellitus without complication (HCC)    Type 1- had a pancreas transplant 2017   History of organ transplantation 08/18/2015   Kidney and Pancreates   HSV-1 (herpes simplex virus 1) infection    HSV-2 (herpes simplex virus 2) infection    Hypertension    Hypothyroidism    Irregular periods 07/05/2014   Neuropathy    in feet   Neuropathy     Osteomyelitis of left foot (HCC) 09/20/2020   PONV (postoperative nausea and vomiting)    Seizures (HCC) 01/2014   ? due to blood sugar   Thyroid enlargement    "not on medication at this time"   Urinary tract infection    hx of   Vaginal discharge 07/05/2014   Vaginal Pap smear, abnormal    Yeast infection    took diflucan saturday    Reviewed problem list, medications and allergies. Physical Assessment:   Vitals:   03/05/23 1106  BP: (!) 186/88  Pulse: 71  Weight: 171 lb 3.2 oz (77.7 kg)  Height: 5\' 3"  (1.6 m)  Body mass index is 30.33 kg/m.       Physical Examination:   General appearance: alert, well appearing, and in no distress  Psych: mood appropriate, normal affect  Skin: warm & dry   Cardiovascular: normal heart rate noted  Respiratory: normal respiratory effort, no  distress  Extremities: no edema   Chaperone: N/A    Assessment & Plan:  1) AUB -Based on continued irregular bleeding despite hormonal management, discussed further workup including follow-up ultrasound as well as endometrial biopsy -Reviewed risk benefit of endometrial biopsy, patient agreeable, but did not want to complete today -Discussed management options including IUD or surgical intervention such as endometrial ablation or hysterectomy -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   Myna Hidalgo, DO Attending Obstetrician & Gynecologist, [facility] for [employer], [facility]

## 2023-03-12 ENCOUNTER — Ambulatory Visit: Payer: 59

## 2023-03-14 ENCOUNTER — Ambulatory Visit: Payer: 59

## 2023-03-14 ENCOUNTER — Other Ambulatory Visit (HOSPITAL_COMMUNITY)
Admission: RE | Admit: 2023-03-14 | Discharge: 2023-03-14 | Disposition: A | Discharge status: Home / Self Care | Payer: 59 | Source: Ambulatory Visit | Attending: Obstetrics & Gynecology | Admitting: Obstetrics & Gynecology

## 2023-03-14 ENCOUNTER — Encounter: Payer: Self-pay | Admitting: Obstetrics & Gynecology

## 2023-03-14 ENCOUNTER — Ambulatory Visit: Payer: 59 | Admitting: Obstetrics & Gynecology

## 2023-03-14 VITALS — BP 219/98 | HR 85

## 2023-03-14 DIAGNOSIS — Z3202 Encounter for pregnancy test, result negative: Secondary | ICD-10-CM

## 2023-03-14 DIAGNOSIS — N939 Abnormal uterine and vaginal bleeding, unspecified: Secondary | ICD-10-CM | POA: Diagnosis present

## 2023-03-14 DIAGNOSIS — Z30430 Encounter for insertion of intrauterine contraceptive device: Secondary | ICD-10-CM

## 2023-03-14 DIAGNOSIS — Z940 Kidney transplant status: Secondary | ICD-10-CM

## 2023-03-14 LAB — POCT URINE PREGNANCY: Preg Test, Ur: NEGATIVE

## 2023-03-14 MED ORDER — LEVONORGESTREL 20 MCG/DAY IU IUD
1.0000 | INTRAUTERINE_SYSTEM | Freq: Once | INTRAUTERINE | Status: AC
  Administered 2023-03-14: 1 via INTRAUTERINE

## 2023-03-14 NOTE — Progress Notes (Signed)
PELVIC US TA/TV: homogeneous anteverted uterus,WNL,EEC 8 mm,normal ovaries,ovaries appear mobile,no free fluid   Chaperone Kara Mead

## 2023-03-14 NOTE — Progress Notes (Signed)
GYN VISIT Patient name: Tracy [name] MRN [id number]  Date of birth: [birth year] Chief Complaint:   Procedure (Endo bx)  History of Present Illness:   Tracy [name] is a 38 y.o. G20P0020 female being seen today for follow up regarding:      AUB: In review-  despite being on Depo she is having breakthrough bleeding.  Sometimes the bleeding is minimal to light other times it is heavy and she will soak through an overnight pad.  In the past she could use Megace to regulate her period, but that is no longer working.  She wishes to review her options and presents today for further management   In review, patient is very active and works/runs a farm.  1 we talked about permanent surgical intervention with hysterectomy the recovery time seems like a challenge.  If anything would consider surgical intervention during their less busy time in the winter    Review of Systems:   Pertinent items are noted in HPI Denies fever/chills, dizziness, headaches, visual disturbances, fatigue, shortness of breath, chest pain, abdominal pain, vomiting. Pertinent History Reviewed:   Past Surgical History:  Procedure Laterality Date   APPLICATION OF A-CELL OF EXTREMITY Right 10/03/2014   Procedure: APPLICATION OF A-CELL OF RIGHT UPPER THIGH WOUND;  Surgeon: Sheral Apley, MD;  Location: [facility] OR;  Service: Orthopedics;  Laterality: Right;   APPLICATION OF WOUND VAC Right 10/03/2014   Procedure: APPLICATION OF WOUND VAC RIGHT THIGH;  Surgeon: Sheral Apley, MD;  Location: [facility] OR;  Service: Orthopedics;  Laterality: Right;   APPLICATION OF WOUND VAC  10/25/2020   Procedure: APPLICATION OF WOUND VAC;  Surgeon: Nadara Mustard, MD;  Location: [facility] OR;  Service: Orthopedics;;   AV FISTULA PLACEMENT Left 09/01/2014   Procedure: BRACHIOCEPHALIC ARTERIOVENOUS (AV) FISTULA CREATION;  Surgeon: Fransisco Hertz, MD;  Location: [facility] OR;  Service: Vascular;  Laterality: Left;   CHOLECYSTECTOMY     COMBINED KIDNEY-PANCREAS  TRANSPLANT  05/15/15   I & D EXTREMITY Right 08/14/2014   Procedure: IRRIGATION AND DEBRIDEMENT EXTREMITY WITH MUSCLE BIOPSY;  Surgeon: Dominica Severin, MD;  Location: [facility] OR;  Service: Orthopedics;  Laterality: Right;   I & D EXTREMITY Right 08/20/2014   Procedure:  I AND D LEG / THIGH ABSCESS AND MANIPULATION OF RIGHT ELBOW.;  Surgeon: Sheral Apley, MD;  Location: [facility] OR;  Service: Orthopedics;  Laterality: Right;   I & D EXTREMITY Right 09/30/2014   Procedure: IRRIGATION AND DEBRIDEMENT RIGHT THIGH ABSCESS;  Surgeon: Teryl Lucy, MD;  Location: [facility] OR;  Service: Orthopedics;  Laterality: Right;   I & D EXTREMITY Right 10/03/2014   Procedure: IRRIGATION AND DEBRIDEMENT RIGHT THIGH ABSCESS,WITH WOUND CLOSURE & MANIPULATION OF RIGHT KNEE;  Surgeon: Sheral Apley, MD;  Location: [facility] OR;  Service: Orthopedics;  Laterality: Right;   I & D EXTREMITY Left 09/19/2020   Procedure: IRRIGATION AND DEBRIDEMENT EXTREMITY;  Surgeon: Myrene Galas, MD;  Location: [facility] OR;  Service: Orthopedics;  Laterality: Left;   I & D EXTREMITY Left 09/21/2020   Procedure: IRRIGATION AND DEBRIDEMENT EXTREMITY, partial excision bone 2nd metatarsal;  Surgeon: Myrene Galas, MD;  Location: [facility] OR;  Service: Orthopedics;  Laterality: Left;   I & D EXTREMITY Left 10/25/2020   Procedure: LEFT FOOT DEBRIDEMENT;  Surgeon: Nadara Mustard, MD;  Location: [facility] OR;  Service: Orthopedics;  Laterality: Left;   INCISION AND DRAINAGE ABSCESS Right 12/09/2014   Procedure: INCISION AND DRAINAGE ABSCESS;  Surgeon: Jewel Baize  Eulah Pont, MD;  Location: [facility] OR;  Service: Orthopedics;  Laterality: Right;   INSERTION OF DIALYSIS CATHETER N/A 09/01/2014   Procedure: INSERTION OF DIALYSIS CATHETER IN RIGHT INTERNAL JUGUILAR;  Surgeon: Fransisco Hertz, MD;  Location: [facility] OR;  Service: Vascular;  Laterality: N/A;   KIDNEY TRANSPLANT  05/15/15   OPEN REDUCTION INTERNAL FIXATION (ORIF) FOOT LISFRANC FRACTURE Left 07/28/2020   Procedure: OPEN REDUCTION INTERNAL FIXATION  (ORIF) FOOT LISFRANC FRACTURE;  Surgeon: Myrene Galas, MD;  Location: [facility] OR;  Service: Orthopedics;  Laterality: Left;   ORIF TOE FRACTURE Left 07/28/2020   Procedure: OPEN REDUCTION INTERNAL FIXATION (ORIF) METATARSAL (TOE) FRACTURE;  Surgeon: Myrene Galas, MD;  Location: [facility] OR;  Service: Orthopedics;  Laterality: Left;   PARS PLANA VITRECTOMY  10/01/2011   Procedure: PARS PLANA VITRECTOMY WITH 25 GAUGE;  Surgeon: Sherrie George, MD;  Location: [facility] OR;  Service: Ophthalmology;  Laterality: Right;  Repair Complex Traction Retinal Detachment   PERCUTANEOUS PINNING Left 07/28/2020   Procedure: PERCUTANEOUS PINNING  2ND AND 3RD METATARSALS;  Surgeon: Myrene Galas, MD;  Location: [facility] OR;  Service: Orthopedics;  Laterality: Left;   PILONIDAL CYST EXCISION     TRIGGER FINGER RELEASE Right 09/21/13   WISDOM TOOTH EXTRACTION      Past Medical History:  Diagnosis Date   Abscess of left foot 09/20/2020   Abscess of right thigh    Anemia    presently on iron supplement   Anxiety    ASCUS with positive high risk HPV cervical 05/21/2021   05/21/21 needs colpo   ASCUS with positive high risk human papillomavirus of vagina 05/01/2020   04/2020 ASCUS +HPV 16 and other will get Colpo    Atypical squamous cell changes of undetermined significance (ASCUS) on vaginal cytology with positive high risk human papilloma virus (HPV) 09/05/2016   Get colpo   Chronic kidney disease    on dialysis T, Th, Sat-  2017 [place] T[facility]   CMV (cytomegalovirus infection) (HCC)    Complication of anesthesia    Depression    Detached retina    Diabetes mellitus without complication (HCC)    Type 1- had a pancreas transplant 2017   History of organ transplantation 08/18/2015   Kidney and Pancreates   HSV-1 (herpes simplex virus 1) infection    HSV-2 (herpes simplex virus 2) infection    Hypertension    Hypothyroidism    Irregular periods 07/05/2014   Neuropathy    in feet   Neuropathy    Osteomyelitis of left  foot (HCC) 09/20/2020   PONV (postoperative nausea and vomiting)    Seizures (HCC) 01/2014   ? due to blood sugar   Thyroid enlargement    "not on medication at this time"   Urinary tract infection    hx of   Vaginal discharge 07/05/2014   Vaginal Pap smear, abnormal    Yeast infection    took diflucan saturday    Reviewed problem list, medications and allergies. Physical Assessment:   Vitals:   03/14/23 0901 03/14/23 0927  BP: (!) 186/91 (!) 219/98  Pulse: 78 85  There is no height or weight on file to calculate BMI.       Physical Examination:   General appearance: alert, well appearing, and in no distress  Psych: mood appropriate, normal affect  Skin: warm & dry   Cardiovascular: normal heart rate noted  Respiratory: normal respiratory effort, no distress  Abdomen: soft, non-tender   Pelvic: VULVA: normal appearing vulva with  no masses, tenderness or lesions, VAGINA: normal appearing vagina with normal color and discharge, no lesions, CERVIX: normal appearing cervix without discharge or lesions  Extremities: no edema   Pelvic US: homogeneous anteverted uterus,WNL,EEC 8 mm,normal ovaries,ovaries appear mobile,no free fluid   IUD INSERTION   The risks and benefits of the method and placement have been thouroughly reviewed with the patient and all questions were answered.  Specifically the patient is aware of failure rate of 02/998, expulsion of the IUD and of possible perforation.  The patient is aware of irregular bleeding due to the method and understands the incidence of irregular bleeding diminishes with time.  Signed copy of informed consent in chart.   A sterile  speculum was placed in the vagina.  The cervix was visualized, prepped using Betadine, and grasped with a single tooth tenaculum. The uterus was sounded to 5 cm.  Mirena  IUD placed per manufacturer's recommendations. The strings were trimmed to approximately 3 cm. The patient tolerated the procedure well.     Chaperone: Faith Rogue     Assessment & Plan:  1) AUB, Mirena insertion -Plan for EMB today to rule out underlying etiology since patient continues to have AUB despite hormonal management -Reviewed today's pelvic ultrasound, which showed no acute abnormalities to explain her AUB -Reviewed management options including staying with Depo, transitioning to IUD or surgical intervention -Discussed endometrial ablation with salpingectomy or laparoscopic hysterectomy with bilateral salpingectomy.  Reviewed risk benefit including but not limited to risk of bleeding, infection, injury.  Discussed that while both surgeries are same-day procedures.  Recovery from hysterectomy is typically 8 weeks and based on her work we were both concerned regarding her ability to recover -After much discussion regarding the pros and cons, plan for Mirena today.  Device placed as above -Plan to follow-up in 3 months   Orders Placed This Encounter  Procedures   POCT urine pregnancy    Return in about 3 months (around 06/11/2023) for IUD follow up with Bethanne Mule.   Myna Hidalgo, DO Attending Obstetrician & Gynecologist, [facility] for [employer], [facility]

## 2023-03-17 ENCOUNTER — Encounter: Payer: Self-pay | Admitting: Obstetrics & Gynecology

## 2023-03-17 LAB — SURGICAL PATHOLOGY

## 2023-03-20 ENCOUNTER — Ambulatory Visit: Payer: 59 | Admitting: Obstetrics & Gynecology

## 2023-09-22 ENCOUNTER — Emergency Department (HOSPITAL_COMMUNITY): Payer: Self-pay

## 2023-09-22 ENCOUNTER — Emergency Department (HOSPITAL_COMMUNITY)
Admission: EM | Admit: 2023-09-22 | Discharge: 2023-09-22 | Disposition: A | Discharge status: Home / Self Care | Payer: Self-pay | Source: Ambulatory Visit | Attending: Emergency Medicine | Admitting: Emergency Medicine

## 2023-09-22 DIAGNOSIS — I120 Hypertensive chronic kidney disease with stage 5 chronic kidney disease or end stage renal disease: Secondary | ICD-10-CM | POA: Diagnosis not present

## 2023-09-22 DIAGNOSIS — R0602 Shortness of breath: Secondary | ICD-10-CM | POA: Diagnosis present

## 2023-09-22 DIAGNOSIS — N186 End stage renal disease: Secondary | ICD-10-CM | POA: Diagnosis not present

## 2023-09-22 DIAGNOSIS — E872 Acidosis, unspecified: Secondary | ICD-10-CM

## 2023-09-22 DIAGNOSIS — D631 Anemia in chronic kidney disease: Secondary | ICD-10-CM | POA: Insufficient documentation

## 2023-09-22 DIAGNOSIS — E1010 Type 1 diabetes mellitus with ketoacidosis without coma: Secondary | ICD-10-CM | POA: Diagnosis not present

## 2023-09-22 DIAGNOSIS — E039 Hypothyroidism, unspecified: Secondary | ICD-10-CM | POA: Insufficient documentation

## 2023-09-22 DIAGNOSIS — D649 Anemia, unspecified: Secondary | ICD-10-CM

## 2023-09-22 DIAGNOSIS — Z992 Dependence on renal dialysis: Secondary | ICD-10-CM | POA: Insufficient documentation

## 2023-09-22 DIAGNOSIS — E1022 Type 1 diabetes mellitus with diabetic chronic kidney disease: Secondary | ICD-10-CM | POA: Diagnosis not present

## 2023-09-22 DIAGNOSIS — N185 Chronic kidney disease, stage 5: Secondary | ICD-10-CM

## 2023-09-22 LAB — CBC WITH DIFFERENTIAL/PLATELET
Abs Immature Granulocytes: 0.03 K/uL (ref 0.00–0.07)
Basophils Absolute: 0 K/uL (ref 0.0–0.1)
Basophils Relative: 0 %
Eosinophils Absolute: 0 K/uL (ref 0.0–0.5)
Eosinophils Relative: 0 %
HCT: 23.1 % — ABNORMAL LOW (ref 36.0–46.0)
Hemoglobin: 7.3 g/dL — ABNORMAL LOW (ref 12.0–15.0)
Immature Granulocytes: 0 %
Lymphocytes Relative: 7 %
Lymphs Abs: 0.7 K/uL (ref 0.7–4.0)
MCH: 27.3 pg (ref 26.0–34.0)
MCHC: 31.6 g/dL (ref 30.0–36.0)
MCV: 86.5 fL (ref 80.0–100.0)
Monocytes Absolute: 0.3 K/uL (ref 0.1–1.0)
Monocytes Relative: 3 %
Neutro Abs: 8.7 K/uL — ABNORMAL HIGH (ref 1.7–7.7)
Neutrophils Relative %: 90 %
Platelets: 194 K/uL (ref 150–400)
RBC: 2.67 MIL/uL — ABNORMAL LOW (ref 3.87–5.11)
RDW: 16 % — ABNORMAL HIGH (ref 11.5–15.5)
WBC: 9.8 K/uL (ref 4.0–10.5)
nRBC: 0 % (ref 0.0–0.2)

## 2023-09-22 LAB — COMPREHENSIVE METABOLIC PANEL WITH GFR
ALT: 10 U/L (ref 0–44)
AST: 11 U/L — ABNORMAL LOW (ref 15–41)
Albumin: 3.8 g/dL (ref 3.5–5.0)
Alkaline Phosphatase: 81 U/L (ref 38–126)
Anion gap: 10 (ref 5–15)
BUN: 63 mg/dL — ABNORMAL HIGH (ref 6–20)
CO2: 13 mmol/L — ABNORMAL LOW (ref 22–32)
Calcium: 8.7 mg/dL — ABNORMAL LOW (ref 8.9–10.3)
Chloride: 116 mmol/L — ABNORMAL HIGH (ref 98–111)
Creatinine, Ser: 5.06 mg/dL — ABNORMAL HIGH (ref 0.44–1.00)
GFR, Estimated: 11 mL/min — ABNORMAL LOW (ref >60)
Glucose, Bld: 127 mg/dL — ABNORMAL HIGH (ref 70–99)
Potassium: 3.9 mmol/L (ref 3.5–5.1)
Sodium: 139 mmol/L (ref 135–145)
Total Bilirubin: 0.3 mg/dL (ref 0.0–1.2)
Total Protein: 6.2 g/dL — ABNORMAL LOW (ref 6.5–8.1)

## 2023-09-22 LAB — TROPONIN I (HIGH SENSITIVITY)
Troponin I (High Sensitivity): 118 ng/L (ref <18)
Troponin I (High Sensitivity): 123 ng/L (ref <18)

## 2023-09-22 LAB — PREPARE RBC (CROSSMATCH)

## 2023-09-22 LAB — FOLATE: Folate: 4.5 ng/mL — ABNORMAL LOW (ref >5.9)

## 2023-09-22 LAB — FERRITIN: Ferritin: 134 ng/mL (ref 11–307)

## 2023-09-22 LAB — IRON AND TIBC
Iron: 157 ug/dL (ref 28–170)
Saturation Ratios: 52 % — ABNORMAL HIGH (ref 10.4–31.8)
TIBC: 302 ug/dL (ref 250–450)
UIBC: 145 ug/dL

## 2023-09-22 LAB — VITAMIN B12: Vitamin B-12: 288 pg/mL (ref 180–914)

## 2023-09-22 LAB — RETICULOCYTES
Immature Retic Fract: 6.8 % (ref 2.3–15.9)
RBC.: 2.32 MIL/uL — ABNORMAL LOW (ref 3.87–5.11)
Retic Count, Absolute: 24.4 K/uL (ref 19.0–186.0)
Retic Ct Pct: 1.1 % (ref 0.4–3.1)

## 2023-09-22 LAB — BRAIN NATRIURETIC PEPTIDE: B Natriuretic Peptide: 367 pg/mL — ABNORMAL HIGH (ref 0.0–100.0)

## 2023-09-22 MED ORDER — CARVEDILOL 12.5 MG PO TABS
12.5000 mg | ORAL_TABLET | Freq: Once | ORAL | Status: AC
  Administered 2023-09-22 (×2): 12.5 mg via ORAL
  Filled 2023-09-22: qty 1

## 2023-09-22 MED ORDER — SODIUM BICARBONATE 8.4 % IV SOLN
50.0000 meq | Freq: Once | INTRAVENOUS | Status: AC
  Administered 2023-09-22 (×2): 50 meq via INTRAVENOUS
  Filled 2023-09-22: qty 50

## 2023-09-22 MED ORDER — HEPARIN SOD (PORK) LOCK FLUSH 100 UNIT/ML IV SOLN
500.0000 [IU] | Freq: Once | INTRAVENOUS | Status: AC
  Administered 2023-09-22 (×2): 500 [IU]
  Filled 2023-09-22: qty 5

## 2023-09-22 MED ORDER — SODIUM CHLORIDE 0.9% IV SOLUTION: Freq: Once | INTRAVENOUS | Status: AC

## 2023-09-22 MED ORDER — LIDOCAINE-PRILOCAINE 2.5-2.5 % EX CREA
TOPICAL_CREAM | Freq: Once | CUTANEOUS | Status: AC
  Filled 2023-09-22: qty 5

## 2023-09-22 NOTE — ED Notes (Signed)
 Patient is aware of needing a urine specimen

## 2023-09-22 NOTE — ED Provider Notes (Signed)
 [place] EMERGENCY DEPARTMENT AT [facility] Provider Note   CSN: [id number] Arrival date & time: 09/22/23  1241     Patient presents with: Shortness of Breath and Chest Pain   Tracy [name] is a 38 y.o. female.   Pt is a 38 yo female with pmhx significant for anemia, hypothyroidism, htn, depression, ESRD, s/p kidney and pancreas transplant in 2017, DM1, and osteomyelitis.  Pt's kidney function has been progressively worsening and she's been getting weekly labs to determine if she needs to go back on dialysis.  Pt said she's had episodes of SOB over this weekend.  They come out of nowhere and don't seem to be associated with anything.  No sob now.  No fevers, chills.       Prior to Admission medications   Medication Sig Start Date End Date Taking? Authorizing Provider  busPIRone  (BUSPAR ) 5 MG tablet Take 1 tablet (5 mg total) by mouth 3 (three) times daily. 05/16/21   Signa Delon LABOR, NP  Cyanocobalamin  (VITAMIN B-12 PO) Take by mouth. Gummy-daily Patient not taking: Reported on 03/14/2023    [provider]  fluconazole  (DIFLUCAN ) 150 MG tablet Take 1 now and 1 in 3 days Patient not taking: Reported on 03/14/2023 11/29/20   Signa Delon LABOR, NP  lidocaine -prilocaine  (EMLA ) cream Apply 1 application topically as needed. Port access Patient not taking: Reported on 03/05/2023 09/15/20   [provider]  LORazepam  (ATIVAN ) 0.5 MG tablet Take 1 tablet (0.5 mg total) by mouth every 8 (eight) hours as needed for anxiety. Patient not taking: Reported on 03/14/2023 05/16/21   Signa Delon LABOR, NP  medroxyPROGESTERone  (DEPO-PROVERA ) 150 MG/ML injection INJECT 1 ML into THE muscle EVERY THREE MONTHS 06/26/22   Signa Delon LABOR, NP  Multiple Vitamin (MULTIVITAMIN) tablet Take 1 tablet by mouth daily. Gummy-daily    [provider]  mycophenolate  (MYFORTIC ) 180 MG EC tablet Take 540 mg by mouth 2 (two) times daily. 08/09/15   [provider]   predniSONE  (DELTASONE ) 10 MG tablet Take 10 mg by mouth daily with breakfast.    [provider]  tacrolimus  (PROGRAF ) 1 MG capsule Take 2 mg by mouth 2 (two) times daily. 08/09/15   [provider]  valACYclovir  (VALTREX ) 1000 MG tablet Take 2 at sign of fever blister and 2 the next day Patient not taking: Reported on 03/14/2023 12/18/22   Signa Delon LABOR, NP  valGANciclovir (VALCYTE) 450 MG tablet Take 900 mg by mouth daily. Patient not taking: Reported on 12/18/2022 04/27/21   [provider]    Allergies: Penicillins and Sulfa antibiotics    Review of Systems  Respiratory:  Positive for shortness of breath.   All other systems reviewed and are negative.   Updated Vital Signs BP (!) 181/86   Pulse 72   Temp 98.2 F (36.8 C) (Oral)   Resp 10   Ht 5' 9 (1.753 m)   Wt 77.7 kg   SpO2 100%   BMI 25.30 kg/m   Physical Exam Vitals and nursing note reviewed.  Constitutional:      Appearance: She is well-developed.  HENT:     Head: Normocephalic and atraumatic.     Mouth/Throat:     Mouth: Mucous membranes are moist.     Pharynx: Oropharynx is clear.  Eyes:     Extraocular Movements: Extraocular movements intact.     Pupils: Pupils are equal, round, and reactive to light.  Cardiovascular:     Rate and  Rhythm: Normal rate and regular rhythm.  Pulmonary:     Effort: Pulmonary effort is normal.     Breath sounds: Normal breath sounds.  Abdominal:     General: Bowel sounds are normal.     Palpations: Abdomen is soft.  Musculoskeletal:        General: Normal range of motion.     Cervical back: Normal range of motion and neck supple.  Skin:    General: Skin is warm.     Capillary Refill: Capillary refill takes less than 2 seconds.  Neurological:     General: No focal deficit present.     Mental Status: She is alert and oriented to person, place, and time.  Psychiatric:        Mood and Affect: Mood normal.        Behavior: Behavior normal.      (all labs ordered are listed, but only abnormal results are displayed) Labs Reviewed  CBC WITH DIFFERENTIAL/PLATELET - Abnormal; Notable for the following components:      Result Value   RBC 2.67 (*)    Hemoglobin 7.3 (*)    HCT 23.1 (*)    RDW 16.0 (*)    Neutro Abs 8.7 (*)    All other components within normal limits  COMPREHENSIVE METABOLIC PANEL WITH GFR - Abnormal; Notable for the following components:   Chloride 116 (*)    CO2 13 (*)    Glucose, Bld 127 (*)    BUN 63 (*)    Creatinine, Ser 5.06 (*)    Calcium 8.7 (*)    Total Protein 6.2 (*)    AST 11 (*)    GFR, Estimated 11 (*)    All other components within normal limits  BRAIN NATRIURETIC PEPTIDE - Abnormal; Notable for the following components:   B Natriuretic Peptide 367.0 (*)    All other components within normal limits  FOLATE - Abnormal; Notable for the following components:   Folate 4.5 (*)    All other components within normal limits  IRON  AND TIBC - Abnormal; Notable for the following components:   Saturation Ratios 52 (*)    All other components within normal limits  RETICULOCYTES - Abnormal; Notable for the following components:   RBC. 2.32 (*)    All other components within normal limits  TROPONIN I (HIGH SENSITIVITY) - Abnormal; Notable for the following components:   Troponin I (High Sensitivity) 118 (*)    All other components within normal limits  TROPONIN I (HIGH SENSITIVITY) - Abnormal; Notable for the following components:   Troponin I (High Sensitivity) 123 (*)    All other components within normal limits  VITAMIN B12  FERRITIN  URINALYSIS, ROUTINE W REFLEX MICROSCOPIC  PREGNANCY, URINE  TYPE AND SCREEN  PREPARE RBC (CROSSMATCH)    EKG: EKG Interpretation Date/Time:  Monday September 22 2023 12:52:28 EDT Ventricular Rate:  75 PR Interval:  146 QRS Duration:  96 QT Interval:  402 QTC Calculation: 448 R Axis:   83  Text Interpretation: Normal sinus rhythm ST & T wave  abnormality, consider inferior ischemia Abnormal ECG When compared with ECG of 17-Jun-2022 13:35, T wave inversion now evident in Inferior leads Nonspecific T wave abnormality now evident in Lateral leads Confirmed by Dean Clarity [phone]) on 09/22/2023 1:19:58 PM  Radiology: [facility] Chest Portable 1 View Result Date: 09/22/2023 CLINICAL DATA:  Chest pain, shortness of breath EXAM: PORTABLE CHEST 1 VIEW COMPARISON:  09/01/2014 FINDINGS: Interval removal of right dialysis catheter in  placement of right Port-A-Cath with the tip at the cavoatrial junction. Heart is upper limits normal in size. Mediastinal contours within normal limits. Lungs clear. No effusions or acute bony abnormality. IMPRESSION: No active disease. Electronically Signed   By: Franky Crease M.D.   On: 09/22/2023 14:04     Procedures   Medications Ordered in the ED  0.9 %  sodium chloride  infusion (Manually program via Guardrails IV Fluids) ( Intravenous New Bag/Given 09/22/23 1809)  lidocaine -prilocaine  (EMLA ) cream ( Topical Given 09/22/23 1609)  sodium bicarbonate  injection 50 mEq (50 mEq Intravenous Given 09/22/23 1721)  carvedilol  (COREG ) tablet 12.5 mg (12.5 mg Oral Given 09/22/23 1726)                                    Medical Decision Making Amount and/or Complexity of Data Reviewed Labs: ordered. Radiology: ordered.  Risk Prescription drug management.   This patient presents to the ED for concern of sob, this involves an extensive number of treatment options, and is a complaint that carries with it a high risk of complications and morbidity.  The differential diagnosis includes chf, anemia, electrolyte abn, pna   Co morbidities that complicate the patient evaluation  anemia, hypothyroidism, htn, depression, ESRD, s/p kidney and pancreas transplant in 2017, DM1, and osteomyelitis   Additional history obtained:  Additional history obtained from epic chart review External records from outside source obtained and  reviewed including husband   Lab Tests:  I Ordered, and personally interpreted labs.  The pertinent results include:  cbc with hgb low at 7.3; cmp with bun 63 and cr 5.06; trop elevated at 118, but flat; bnp 367   Imaging Studies ordered:  I ordered imaging studies including cxr  I independently visualized and interpreted imaging which showed No active disease.  I agree with the radiologist interpretation   Cardiac Monitoring:  The patient was maintained on a cardiac monitor.  I personally viewed and interpreted the cardiac monitored which showed an underlying rhythm of: nsr   Medicines ordered and prescription drug management:  I ordered medication including blood and bicarb  for sx  Reevaluation of the patient after these medicines showed that the patient improved I have reviewed the patients home medicines and have made adjustments as needed    Critical Interventions:  Blood transfusion   Consultations Obtained:  I requested consultation with the pt's nephrologist (Dr. Harwood),  and discussed lab and imaging findings as well as pertinent plan - she agrees with plan and will f/u as an outpatient   Problem List / ED Course:  Symptomatic anemia due to CKD:  pt given 1 unit of blood.  Her nephrologist will follow closely. CKD:  stable Metabolic acidosis:  likely due to her CKD.   Reevaluation:  After the interventions noted above, I reevaluated the patient and found that they have :improved   Social Determinants of Health:  Lives at home   Dispostion:  After consideration of the diagnostic results and the patients response to treatment, I feel that the patent would benefit from discharge with outpatient f/u.       Final diagnoses:  CKD (chronic kidney disease) stage 5, GFR less than 15 ml/min (HCC)  Anemia due to stage 5 chronic kidney disease, not on chronic dialysis (HCC)  Symptomatic anemia  Metabolic acidosis    ED Discharge Orders     None  Dean Clarity, MD 09/22/23 1910

## 2023-09-22 NOTE — ED Triage Notes (Signed)
 Pt comes in for SOB. Pt is a kidney and pancreas transplant on 04/15/15. Pt has been SOB for the last 2 days. Pt states they events come out of no where. Pt does not have any meds/inhaler for sob.  Pt states her labs have been off past couple of months. Nephology is aware and figuring out when the pt can get on dialysis. When pt called nephrologist today, thy told her to come here to get an EKG and xray. Pt is A&Ox4.

## 2023-09-23 LAB — TYPE AND SCREEN
ABO/RH(D): O POS
Antibody Screen: NEGATIVE
Unit division: 0

## 2023-09-23 LAB — BPAM RBC
Blood Product Expiration Date: 202509132359
ISSUE DATE / TIME: 202508111754
Unit Type and Rh: 5100

## 2023-11-13 ENCOUNTER — Encounter (INDEPENDENT_AMBULATORY_CARE_PROVIDER_SITE_OTHER): Payer: 59 | Admitting: Ophthalmology

## 2023-12-31 ENCOUNTER — Other Ambulatory Visit (HOSPITAL_COMMUNITY): Payer: Self-pay | Admitting: *Deleted

## 2023-12-31 DIAGNOSIS — T82898A Other specified complication of vascular prosthetic devices, implants and grafts, initial encounter: Secondary | ICD-10-CM

## 2024-01-01 ENCOUNTER — Ambulatory Visit (HOSPITAL_COMMUNITY)
Admission: RE | Admit: 2024-01-01 | Discharge: 2024-01-01 | Disposition: A | Discharge status: Home / Self Care | Source: Ambulatory Visit | Attending: *Deleted | Admitting: *Deleted

## 2024-01-01 DIAGNOSIS — T82898A Other specified complication of vascular prosthetic devices, implants and grafts, initial encounter: Secondary | ICD-10-CM | POA: Diagnosis present

## 2024-01-07 NOTE — Telephone Encounter (Signed)
See pt messages.

## 2024-01-10 ENCOUNTER — Emergency Department (HOSPITAL_COMMUNITY)
Admission: EM | Admit: 2024-01-10 | Discharge: 2024-01-10 | Disposition: A | Discharge status: Home / Self Care | Attending: Emergency Medicine | Admitting: Emergency Medicine

## 2024-01-10 ENCOUNTER — Encounter (HOSPITAL_COMMUNITY): Payer: Self-pay

## 2024-01-10 DIAGNOSIS — T829XXA Unspecified complication of cardiac and vascular prosthetic device, implant and graft, initial encounter: Secondary | ICD-10-CM

## 2024-01-10 DIAGNOSIS — T8249XA Other complication of vascular dialysis catheter, initial encounter: Secondary | ICD-10-CM | POA: Diagnosis present

## 2024-01-10 DIAGNOSIS — Z992 Dependence on renal dialysis: Secondary | ICD-10-CM | POA: Diagnosis not present

## 2024-01-10 MED ORDER — POVIDONE-IODINE 10 % EX SOLN
CUTANEOUS | Status: AC
  Administered 2024-01-10: 2
  Filled 2024-01-10: qty 29.6

## 2024-01-10 MED ORDER — POVIDONE-IODINE 10 % EX OINT
TOPICAL_OINTMENT | Freq: Once | CUTANEOUS | Status: DC
  Filled 2024-01-10: qty 28.35

## 2024-01-10 NOTE — ED Provider Notes (Signed)
  EMERGENCY DEPARTMENT AT [facility] Provider Note   CSN: [id number] Arrival date & time: 01/10/24  1836     Patient presents with: Vascular Access Problem   Tracy [name] is a 38 y.o. female.   After the      This patient is a 38 year old female with peritoneal dialysis, she has a catheter in her abdomen and states that while she was using the bathroom the tip of the catheter fell off and the underlying part was exposed to her clothing.  She was asked by nephrology to switch out the catheter exposed part with the mini Transfer set with twist clamp.  She has no other symptoms  Prior to Admission medications   Medication Sig Start Date End Date Taking? Authorizing Provider  busPIRone  (BUSPAR ) 5 MG tablet Take 1 tablet (5 mg total) by mouth 3 (three) times daily. 05/16/21   Signa Delon LABOR, NP  Cyanocobalamin  (VITAMIN B-12 PO) Take by mouth. Gummy-daily Patient not taking: Reported on 03/14/2023    [provider]  fluconazole  (DIFLUCAN ) 150 MG tablet Take 1 now and 1 in 3 days Patient not taking: Reported on 03/14/2023 11/29/20   Signa Delon LABOR, NP  lidocaine -prilocaine  (EMLA ) cream Apply 1 application topically as needed. Port access Patient not taking: Reported on 03/05/2023 09/15/20   [provider]  LORazepam  (ATIVAN ) 0.5 MG tablet Take 1 tablet (0.5 mg total) by mouth every 8 (eight) hours as needed for anxiety. Patient not taking: Reported on 03/14/2023 05/16/21   Signa Delon LABOR, NP  medroxyPROGESTERone  (DEPO-PROVERA ) 150 MG/ML injection INJECT 1 ML into THE muscle EVERY THREE MONTHS 06/26/22   Signa Delon LABOR, NP  Multiple Vitamin (MULTIVITAMIN) tablet Take 1 tablet by mouth daily. Gummy-daily    [provider]  mycophenolate  (MYFORTIC ) 180 MG EC tablet Take 540 mg by mouth 2 (two) times daily. 08/09/15   [provider]  predniSONE  (DELTASONE ) 10 MG tablet Take 10 mg by mouth daily with breakfast.     [provider]  tacrolimus  (PROGRAF ) 1 MG capsule Take 2 mg by mouth 2 (two) times daily. 08/09/15   [provider]  valACYclovir  (VALTREX ) 1000 MG tablet Take 2 at sign of fever blister and 2 the next day Patient not taking: Reported on 03/14/2023 12/18/22   Signa Delon LABOR, NP  valGANciclovir (VALCYTE) 450 MG tablet Take 900 mg by mouth daily. Patient not taking: Reported on 12/18/2022 04/27/21   [provider]    Allergies: Penicillins and Sulfa antibiotics    Review of Systems  All other systems reviewed and are negative.   Updated Vital Signs BP (!) 146/76 (BP Location: Right Arm)   Pulse 71   Temp 98.5 F (36.9 C) (Oral)   Resp 17   Ht 1.753 m (5' 9)   Wt 77.7 kg   SpO2 100%   BMI 25.30 kg/m   Physical Exam Constitutional:      Comments: Well-appearing, no distress, no tachycardia, peritoneal dialysis catheter appears clean and intact, Has been replaced over the tip of her existing transfer set     (all labs ordered are listed, but only abnormal results are displayed) Labs Reviewed - No data to display  EKG: None  Radiology: No results found.   Procedures   Medications Ordered in the ED  povidone-iodine  (BETADINE ) 10 % ointment ( Topical Not Given 01/10/24 2043)  povidone-iodine  (BETADINE ) 10 % external solution (2 Applications  Given 01/10/24 2043)  Medical Decision Making Risk OTC drugs.   Will discuss with nephrology prior to switching out sent  Session with Dr. Refugio guidance was given on the appropriate sterile technique to replace the tubing which was done at the bedside.  Patient stable for discharge     Final diagnoses:  Complication associated with dialysis catheter    ED Discharge Orders     None          Cleotilde Rogue, MD 01/10/24 2111

## 2024-01-10 NOTE — Discharge Instructions (Signed)
 Please follow up with your kidney doctors as indicated, no need to come back to the hospital unless you are developing increasing fever abdominal pain or other worsening symptoms

## 2024-01-10 NOTE — ED Triage Notes (Signed)
 Pt comes in for dialysis cath end piece came off when she used the bathroom. In training, the pt was told if the cap came off to go to the nearest ED with a new cath line set. Pt is not sure what the end cath touch so she is not sure if it clean anymore. A&Ox4.

## 2024-01-10 NOTE — ED Notes (Signed)
 MD replaced part of dialysis catheter.  RN at bedside to assist
